# Patient Record
Sex: Female | Born: 1950 | Race: Black or African American | Hispanic: No | Smoking: Never smoker
Health system: Southern US, Community
[De-identification: ages and names within clinical notes are randomized; demographics above are authoritative.]

## PROBLEM LIST (undated history)

## (undated) DIAGNOSIS — L299 Pruritus, unspecified: Secondary | ICD-10-CM

## (undated) DIAGNOSIS — K5732 Diverticulitis of large intestine without perforation or abscess without bleeding: Secondary | ICD-10-CM

## (undated) DIAGNOSIS — H5712 Ocular pain, left eye: Secondary | ICD-10-CM

## (undated) DIAGNOSIS — H269 Unspecified cataract: Secondary | ICD-10-CM

## (undated) DIAGNOSIS — I10 Essential (primary) hypertension: Secondary | ICD-10-CM

## (undated) DIAGNOSIS — G8929 Other chronic pain: Secondary | ICD-10-CM

## (undated) DIAGNOSIS — N201 Calculus of ureter: Secondary | ICD-10-CM

## (undated) DIAGNOSIS — F32A Depression, unspecified: Secondary | ICD-10-CM

## (undated) DIAGNOSIS — H40009 Preglaucoma, unspecified, unspecified eye: Secondary | ICD-10-CM

## (undated) DIAGNOSIS — B029 Zoster without complications: Secondary | ICD-10-CM

## (undated) DIAGNOSIS — I219 Acute myocardial infarction, unspecified: Secondary | ICD-10-CM

## (undated) DIAGNOSIS — Z170 Estrogen receptor positive status [ER+]: Secondary | ICD-10-CM

## (undated) DIAGNOSIS — T8859XA Other complications of anesthesia, initial encounter: Secondary | ICD-10-CM

## (undated) DIAGNOSIS — J309 Allergic rhinitis, unspecified: Secondary | ICD-10-CM

## (undated) DIAGNOSIS — M1990 Unspecified osteoarthritis, unspecified site: Secondary | ICD-10-CM

## (undated) DIAGNOSIS — M7061 Trochanteric bursitis, right hip: Secondary | ICD-10-CM

## (undated) DIAGNOSIS — Z923 Personal history of irradiation: Secondary | ICD-10-CM

## (undated) DIAGNOSIS — R3915 Urgency of urination: Secondary | ICD-10-CM

## (undated) DIAGNOSIS — K5900 Constipation, unspecified: Secondary | ICD-10-CM

## (undated) DIAGNOSIS — R634 Abnormal weight loss: Secondary | ICD-10-CM

## (undated) DIAGNOSIS — M797 Fibromyalgia: Secondary | ICD-10-CM

## (undated) DIAGNOSIS — T7840XA Allergy, unspecified, initial encounter: Secondary | ICD-10-CM

## (undated) DIAGNOSIS — H409 Unspecified glaucoma: Secondary | ICD-10-CM

## (undated) DIAGNOSIS — K5790 Diverticulosis of intestine, part unspecified, without perforation or abscess without bleeding: Secondary | ICD-10-CM

## (undated) DIAGNOSIS — R52 Pain, unspecified: Secondary | ICD-10-CM

## (undated) DIAGNOSIS — Z87442 Personal history of urinary calculi: Secondary | ICD-10-CM

## (undated) DIAGNOSIS — G3184 Mild cognitive impairment, so stated: Secondary | ICD-10-CM

## (undated) DIAGNOSIS — Z9221 Personal history of antineoplastic chemotherapy: Secondary | ICD-10-CM

## (undated) DIAGNOSIS — N289 Disorder of kidney and ureter, unspecified: Secondary | ICD-10-CM

## (undated) DIAGNOSIS — R1032 Left lower quadrant pain: Secondary | ICD-10-CM

## (undated) DIAGNOSIS — Z87448 Personal history of other diseases of urinary system: Secondary | ICD-10-CM

## (undated) DIAGNOSIS — R251 Tremor, unspecified: Secondary | ICD-10-CM

## (undated) DIAGNOSIS — G500 Trigeminal neuralgia: Secondary | ICD-10-CM

## (undated) DIAGNOSIS — K140 Glossitis: Secondary | ICD-10-CM

## (undated) DIAGNOSIS — R413 Other amnesia: Secondary | ICD-10-CM

## (undated) DIAGNOSIS — R51 Headache: Secondary | ICD-10-CM

## (undated) DIAGNOSIS — S93602A Unspecified sprain of left foot, initial encounter: Secondary | ICD-10-CM

## (undated) DIAGNOSIS — C50411 Malignant neoplasm of upper-outer quadrant of right female breast: Secondary | ICD-10-CM

## (undated) DIAGNOSIS — Z8619 Personal history of other infectious and parasitic diseases: Secondary | ICD-10-CM

## (undated) DIAGNOSIS — M549 Dorsalgia, unspecified: Secondary | ICD-10-CM

## (undated) DIAGNOSIS — R519 Headache, unspecified: Secondary | ICD-10-CM

## (undated) DIAGNOSIS — R319 Hematuria, unspecified: Secondary | ICD-10-CM

## (undated) DIAGNOSIS — D509 Iron deficiency anemia, unspecified: Secondary | ICD-10-CM

## (undated) DIAGNOSIS — M940 Chondrocostal junction syndrome [Tietze]: Secondary | ICD-10-CM

## (undated) HISTORY — DX: Acute myocardial infarction, unspecified: I21.9

## (undated) HISTORY — DX: Zoster without complications: B02.9

## (undated) HISTORY — DX: Ocular pain, left eye: H57.12

## (undated) HISTORY — DX: Trigeminal neuralgia: G50.0

## (undated) HISTORY — DX: Estrogen receptor positive status (ER+): Z17.0

## (undated) HISTORY — DX: Diverticulitis of large intestine without perforation or abscess without bleeding: K57.32

## (undated) HISTORY — DX: Left lower quadrant pain: R10.32

## (undated) HISTORY — DX: Morbid (severe) obesity due to excess calories: E66.01

## (undated) HISTORY — DX: Diverticulosis of intestine, part unspecified, without perforation or abscess without bleeding: K57.90

## (undated) HISTORY — DX: Allergic rhinitis, unspecified: J30.9

## (undated) HISTORY — DX: Trochanteric bursitis, right hip: M70.61

## (undated) HISTORY — DX: Constipation, unspecified: K59.00

## (undated) HISTORY — DX: Abnormal weight loss: R63.4

## (undated) HISTORY — DX: Fibromyalgia: M79.7

## (undated) HISTORY — DX: Depression, unspecified: F32.A

## (undated) HISTORY — DX: Glossitis: K14.0

## (undated) HISTORY — DX: Unspecified glaucoma: H40.9

## (undated) HISTORY — DX: Unspecified osteoarthritis, unspecified site: M19.90

## (undated) HISTORY — DX: Chondrocostal junction syndrome (tietze): M94.0

## (undated) HISTORY — DX: Essential (primary) hypertension: I10

## (undated) HISTORY — DX: Personal history of other diseases of urinary system: Z87.448

## (undated) HISTORY — PX: WISDOM TOOTH EXTRACTION: SHX21

## (undated) HISTORY — DX: Dorsalgia, unspecified: M54.9

## (undated) HISTORY — DX: Unspecified cataract: H26.9

## (undated) HISTORY — DX: Mild cognitive impairment of uncertain or unknown etiology: G31.84

## (undated) HISTORY — DX: Hematuria, unspecified: R31.9

## (undated) HISTORY — PX: COLON SURGERY: SHX602

## (undated) HISTORY — DX: Pain, unspecified: R52

## (undated) HISTORY — PX: TONSILLECTOMY: SUR1361

## (undated) HISTORY — DX: Personal history of other infectious and parasitic diseases: Z86.19

## (undated) HISTORY — DX: Allergy, unspecified, initial encounter: T78.40XA

## (undated) HISTORY — DX: Other amnesia: R41.3

## (undated) HISTORY — PX: OTHER SURGICAL HISTORY: SHX169

## (undated) HISTORY — DX: Pruritus, unspecified: L29.9

## (undated) HISTORY — DX: Iron deficiency anemia, unspecified: D50.9

## (undated) HISTORY — DX: Unspecified sprain of left foot, initial encounter: S93.602A

## (undated) HISTORY — DX: Headache: R51

## (undated) HISTORY — DX: Tremor, unspecified: R25.1

## (undated) HISTORY — DX: Malignant neoplasm of upper-outer quadrant of right female breast: C50.411

## (undated) NOTE — *Deleted (*Deleted)
[facility]   Telephone:(336) [phone] Fax:(336) [phone]   Clinic Follow up Note   Patient Care Team: Myrlene Broker, MD as PCP - General (Internal Medicine) Quintella Reichert, MD as PCP - Cardiology (Cardiology) Pershing Proud, RN as Oncology Nurse Navigator Donnelly Angelica, RN as Oncology Nurse Navigator Hoxworth, Sharlet Salina, MD (Inactive) as Consulting Physician (General Surgery) Malachy Mood, MD as Consulting Physician (Hematology) Antony Blackbird, MD as Consulting Physician (Radiation Oncology) Pollyann Samples, NP as Nurse Practitioner (Nurse Practitioner)  Date of Service:  08/10/2020  CHIEF COMPLAINT: F/u of right breast cancer  SUMMARY OF ONCOLOGIC HISTORY: Oncology History Overview Note  Cancer Staging Malignant neoplasm of upper-outer quadrant of right breast in female, estrogen receptor positive (HCC) Staging form: Breast, AJCC 8th Edition - Clinical stage from 11/10/2018: Stage IA (cT1b, cN0, cM0, G2, ER+, PR+, HER2+) - Signed by Malachy Mood, MD on 11/18/2018     Malignant neoplasm of upper-outer quadrant of right breast in female, estrogen receptor positive (HCC)  11/07/2018 Mammogram   Diagnostic 11/07/18 IMPRESSION: 1. 1 x 0.7 x 0.6 cm hypoechoic mass with distortion at the 9-9:30 position of the RIGHT breast 6 cm from the nipple with distortion in the UPPER-OUTER RIGHT breast, corresponding to the mammographic abnormality. One RIGHT axillary lymph node with borderline cortical thickness. Tissue sampling of the RIGHT breast mass and borderline RIGHT axillary lymph node recommended. ADDENDUM: Patient returned today for biopsy of a single borderline thickened lymph node in the RIGHT axilla. I could not reproduce a morphologically abnormal lymph node today, possibly resolved in the interval. Today, only normal appearing lymph nodes were identified in the RIGHT axilla. As such, the axillary ultrasound-guided biopsy was canceled.   11/10/2018 Cancer  Staging   Staging form: Breast, AJCC 8th Edition - Clinical stage from 11/10/2018: Stage IA (cT1b, cN0, cM0, G2, ER+, PR+, HER2+) - Signed by Malachy Mood, MD on 11/18/2018   11/11/2018 Initial Biopsy   Diagnosis 11/11/18 Breast, right, needle core biopsy, upper outer - 9:30 o'clock position - INVASIVE DUCTAL CARCINOMA, GRADE II/III. - SEE MICROSCOPIC DESCRIPTION.   11/11/2018 Receptors her2   Results: IMMUNOHISTOCHEMICAL AND MORPHOMETRIC ANALYSIS PERFORMED MANUALLY The tumor cells are POSITIVE for Her2 (3+). Estrogen Receptor: 95%, POSITIVE, STRONG STAINING INTENSITY Progesterone Receptor: 5%, POSITIVE, STRONG STAINING INTENSITY Proliferation Marker Ki67: 20%   11/17/2018 Initial Diagnosis   Malignant neoplasm of upper-outer quadrant of right breast in female, estrogen receptor positive (HCC)   11/28/2018 Surgery   RIGHT BREAST LUMPECTOMY WITH RADIOACTIVE SEED AND RIGHT AXILLARY SENTINEL LYMPH NODE BIOPSY by Dr. Johna Sheriff  11/28/18    11/28/2018 Pathology Results   Diagnosis 11/28/18  1. Breast, lumpectomy, Right w/seed - INVASIVE DUCTAL CARCINOMA, 1.6 CM, NOTTINGHAM GRADE 2 OF 3. - MARGINS OF RESECTION ARE NOT INVOLVED (CLOSEST MARGIN: LESS THAN 1 MM, ANTERIOR). - DUCTAL CARCINOMA IN SITU. - BIOPSY SITE CHANGES. - SEE ONCOLOGY TABLE. 2. Lymph node, sentinel, biopsy, right Axillary - ONE LYMPH NODE, NEGATIVE FOR CARCINOMA (0/1). 3. Lymph node, sentinel, biopsy, right Axillary - ONE LYMPH NODE, NEGATIVE FOR CARCINOMA (0/1).   11/28/2018 Cancer Staging   Staging form: Breast, AJCC 8th Edition - Pathologic stage from 11/28/2018: Stage IA (pT1c, pN0, cM0, G2, ER+, PR+, HER2+) - Signed by Malachy Mood, MD on 12/11/2018   12/22/2018 Procedure   Baseline ECHO 12/22/18  IMPRESSIONS  1. The left ventricle has normal systolic function with an ejection fraction of 60-65%. The cavity size was normal. There is mild concentric left  ventricular hypertrophy. Left ventricular diastolic parameters were normal.  2.  The right ventricle has normal systolic function. The cavity was normal. There is no increase in right ventricular wall thickness.  3. Left atrial size was moderately dilated.  4. The aortic valve is tricuspid. Aortic valve regurgitation was not assessed by color flow Doppler.    01/02/2019 - 02/12/2020 Chemotherapy   Adjuvant chemo weekly Taxol with Herceptin starting 01/02/19. Will d/c after 02/13/19 and will switch to Kadcyla q3weeks starting 02/20/19 due to worsening neuropathy. Due to worsening Muscle cramps Kadcyla stopped 04/03/19 and switched to Herceptin q3weeks on 04/23/19. Will complete on 02/12/20   04/09/2019 - 05/28/2019 Radiation Therapy   RT with Dr. Roselind Messier 04/09/19-05/28/19   06/29/2019 -  Anti-estrogen oral therapy   Exemestane 25mg  daily starting 06/29/19   04/28/2020 Survivorship   SCP delivered by Santiago Glad, NP      CURRENT THERAPY:   -IV iron as needed -Exemestane 25mg  daily starting 06/29/19  INTERVAL HISTORY: *** Summer [name] is here for a follow up of right breast cancer. She was last seen by me 7 months ago and seen by NP Lacie 4 months ago in interim. She presents to the clinic alone.    REVIEW OF SYSTEMS:  *** Constitutional: Denies fevers, chills or abnormal weight loss Eyes: Denies blurriness of vision Ears, nose, mouth, throat, and face: Denies mucositis or sore throat Respiratory: Denies cough, dyspnea or wheezes Cardiovascular: Denies palpitation, chest discomfort or lower extremity swelling Gastrointestinal:  Denies nausea, heartburn or change in bowel habits Skin: Denies abnormal skin rashes Lymphatics: Denies new lymphadenopathy or easy bruising Neurological:Denies numbness, tingling or new weaknesses Behavioral/Psych: Mood is stable, no new changes  All other systems were reviewed with the patient and are negative.  MEDICAL HISTORY:  Past Medical History:  Diagnosis Date  . Abdominal pain, left lower quadrant 11/09/2009   Qualifier:  Diagnosis of  By: Gabriel Rung LPN, Harriett Sine    . Allergic rhinitis 08/26/2008   Qualifier: Diagnosis of  By: Tawanna Cooler MD, Eugenio Hoes   . Anemia, iron deficiency 11/21/2018  . Arthritis shoulders and back  . BACK PAIN, CHRONIC 06/13/2007   Qualifier: Diagnosis of  By: Tawanna Cooler MD, Eugenio Hoes   . 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 Complication of anesthesia    Pt states having some "difficulty waking up"  . Constipation 07/01/2018  . Costochondritis 05/04/2014  . Diverticulitis of colon 06/13/2007   S/p segmental colectomy for perforated diverticulitis   . Diverticulosis   . Facial pain 10/06/2015  . Fibromyalgia   . Foot sprain, left, initial encounter 08/31/2016  . Glossitis 02/02/2016  . Headache 04/24/2016  . HEMATURIA UNSPECIFIED 06/25/2008   Qualifier: Diagnosis of  By: Tawanna Cooler MD, Eugenio Hoes   . HEMATURIA, HX OF 11/14/2009   Qualifier: Diagnosis of  By: Thurmon Fair CMA (AAMA), Fleet Contras    . History of kidney stones   . History of kidney stones   . History of shingles 2012--  no residual pain  . Itching 10/13/2018  . Left eye pain 07/01/2018  . Left ureteral calculus   . LLQ pain 04/24/2016  . LOC OSTEOARTHROS NOT SPEC PRIM/SEC OTH [facility] SITE 06/27/2009   Qualifier: Diagnosis of  By: Tawanna Cooler MD, Eugenio Hoes   . Malignant neoplasm of upper-outer quadrant of right breast in female, 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  . MCI (mild cognitive impairment)   . Memory loss 12/21/2016  .  Morbid obesity (HCC) 11/09/2010  . Pain of right side of body 08/31/2016  . Personal history of chemotherapy   . Personal history of radiation therapy   . Pruritus 10/26/2016  . Renal disorder   . Right sided temporal headache 12/21/2016  . Tremor 12/21/2016  . Trigeminal neuralgia RIGHT  . Trigeminal neuralgia of right side of face 11/30/2010  . Trochanteric bursitis, right hip 10/26/2016  . Urgency of urination   . Weight loss 07/22/2017  . Zoster without complications 09/27/2011     SURGICAL HISTORY: Past Surgical History:  Procedure Laterality Date  . BREAST BIOPSY Right 11/11/2018  . BREAST LUMPECTOMY Right 11/28/2018  . BREAST LUMPECTOMY WITH RADIOACTIVE SEED AND SENTINEL LYMPH NODE BIOPSY Right 11/28/2018   Procedure: RIGHT BREAST LUMPECTOMY WITH RADIOACTIVE SEED AND RIGHT AXILLARY SENTINEL LYMPH NODE BIOPSY;  Surgeon: Glenna Fellows, MD;  Location: [facility] OR;  Service: General;  Laterality: Right;  . CEREBRAL MICROVASCULAR DECOMPRESSION  07-05-2006   RIGHT TRIGEMINAL NERVE  . CYSTOSCOPY/RETROGRADE/URETEROSCOPY/STONE EXTRACTION WITH BASKET  07/04/2012   Procedure: CYSTOSCOPY/RETROGRADE/URETEROSCOPY/STONE EXTRACTION WITH BASKET;  Surgeon: Garnett Farm, MD;  Location: [facility];  Service: Urology;  Laterality: Left;  . EXPLORATORY LAPAROTOMY/ RESECTION MID TO DISTAL SIGMOID AND PROXIMAL RECTUM/ END PROXIMAL SIGMOID COLOSTOMY  07-17-2006   PERFORATED DIVERTICULITIS WITH PERITONITIS  . gamma knife  05/17/2016   for trigeminal neuralgia, [facility], Dr Angelyn Punt  . gamma knife  2019  . IR CV LINE INJECTION  10/27/2019  . PORTACATH PLACEMENT Left 11/28/2018   Procedure: INSERTION PORT-A-CATH WITH ULTRASOUND;  Surgeon: Glenna Fellows, MD;  Location: [facility] OR;  Service: General;  Laterality: Left;  . portacath removal    . RESECTION COLOSTOMY/ CLOSURE COLOSTOMY WITH COLOPROCTOSTOMY  02-20-2007  . TONSILLECTOMY  AS CHILD  . TOTAL KNEE ARTHROPLASTY Left 06/20/2020   Procedure: LEFT TOTAL KNEE ARTHROPLASTY;  Surgeon: Tarry Kos, MD;  Location: [facility] OR;  Service: Orthopedics;  Laterality: Left;  . URETEROSCOPY  07/04/2012   Procedure: URETEROSCOPY;  Surgeon: Garnett Farm, MD;  Location: [facility];  Service: Urology;  Laterality: Left;  Marland Kitchen VAGINAL HYSTERECTOMY  1998   Partial  . WISDOM TOOTH EXTRACTION      I have reviewed the social history and family history with the patient and they are unchanged from previous note.  ALLERGIES:   is allergic to clindamycin hcl.  MEDICATIONS:  Current Outpatient Medications  Medication Sig Dispense Refill  . augmented betamethasone dipropionate (DIPROLENE-AF) 0.05 % cream Apply 1 application topically daily as needed for itching.    . diphenhydrAMINE (BENADRYL) 25 MG tablet Take 25 mg by mouth every 6 (six) hours as needed for itching, allergies or sleep.    Marland Kitchen docusate sodium (COLACE) 100 MG capsule Take 1 capsule (100 mg total) by mouth daily as needed. 30 capsule 2  . exemestane (AROMASIN) 25 MG tablet Take 25 mg by mouth daily.    Marland Kitchen gabapentin (NEURONTIN) 600 MG tablet TAKE 2 TABLETS BY MOUTH 3 TIMES A DAY. Please call [phone] to schedule an appt. 540 tablet 4  . glycerin adult 2 g suppository Place 1 suppository rectally as needed for constipation. 12 suppository 0  . hydrOXYzine (ATARAX/VISTARIL) 25 MG tablet Take 0.5 tablets (12.5 mg total) by mouth every 8 (eight) hours as needed for itching. 30 tablet 0  . methocarbamol (ROBAXIN) 500 MG tablet Take 1 tablet (500 mg total) by mouth 2 (two) times daily as needed for muscle spasms. 30 tablet 0  .  ondansetron (ZOFRAN) 4 MG tablet Take 1 tablet (4 mg total) by mouth every 8 (eight) hours as needed for nausea or vomiting. 40 tablet 0  . oxyCODONE-acetaminophen (PERCOCET) 5-325 MG tablet Take 1-2 tablets by mouth every 8 (eight) hours as needed for severe pain. 40 tablet 0  . rivaroxaban (XARELTO) 10 MG TABS tablet Take 1 tablet (10 mg total) by mouth daily. 35 tablet 0  . traMADol (ULTRAM) 50 MG tablet Take 1-2 tabs po tid prn pain 30 tablet 0  . Vitamin D, Ergocalciferol, (DRISDOL) 1.25 MG (50000 UNIT) CAPS capsule Take 1 capsule (50,000 Units total) by mouth every 7 (seven) days. (Patient taking differently: Take 50,000 Units by mouth every Saturday. ) 12 capsule 0   No current facility-administered medications for this visit.    PHYSICAL EXAMINATION: ECOG PERFORMANCE STATUS: {CHL ONC ECOG PS:[phone]}  There were no vitals  filed for this visit. There were no vitals filed for this visit. *** GENERAL:alert, no distress and comfortable SKIN: skin color, texture, turgor are normal, no rashes or significant lesions EYES: normal, Conjunctiva are pink and non-injected, sclera clear {OROPHARYNX:no exudate, no erythema and lips, buccal mucosa, and tongue normal}  NECK: supple, thyroid normal size, non-tender, without nodularity LYMPH:  no palpable lymphadenopathy in the cervical, axillary {or inguinal} LUNGS: clear to auscultation and percussion with normal breathing effort HEART: regular rate & rhythm and no murmurs and no lower extremity edema ABDOMEN:abdomen soft, non-tender and normal bowel sounds Musculoskeletal:no cyanosis of digits and no clubbing  NEURO: alert & oriented x 3 with fluent speech, no focal motor/sensory deficits  LABORATORY DATA:  I have reviewed the data as listed CBC Latest Ref Rng & Units 06/21/2020 06/13/2020 04/28/2020  WBC 4.0 - 10.5 K/uL 7.2 4.4 4.3  Hemoglobin 12.0 - 15.0 g/dL 10.3(L) 12.6 12.3  Hematocrit 36 - 46 % 32.4(L) 41.2 38.7  Platelets 150 - 400 K/uL 204 249 241     CMP Latest Ref Rng & Units 06/21/2020 06/13/2020 04/28/2020  Glucose 70 - 99 mg/dL 161(W) 94 88  BUN 8 - 23 mg/dL 12 9 10   Creatinine 0.44 - 1.00 mg/dL 9.60 4.54 0.98  Sodium 135 - 145 mmol/L 137 142 141  Potassium 3.5 - 5.1 mmol/L 4.1 3.8 3.8  Chloride 98 - 111 mmol/L 105 106 105  CO2 22 - 32 mmol/L 25 28 30   Calcium 8.9 - 10.3 mg/dL 8.3(L) 9.4 9.6  Total Protein 6.5 - 8.1 g/dL - 6.9 6.6  Total Bilirubin 0.3 - 1.2 mg/dL - 0.7 1.1(B)  Alkaline Phos 38 - 126 U/L - 100 118  AST 15 - 41 U/L - 24 17  ALT 0 - 44 U/L - 12 7      RADIOGRAPHIC STUDIES: I have personally reviewed the radiological images as listed and agreed with the findings in the report. No results found.   ASSESSMENT & PLAN:  Tacora Athanas is a 83 y.o. female with   1.Malignant neoplasm of upper-outer quadrant of right breast,  StageIA,pT1b,N0,M0, ER/PR+, HER2+, GradeII -She was diagnosed in 11/2018. She is s/p right breastlumpectomyand SLNB and adjuvant radiation.  -She startedadjuvantweekly Taxol with Herceptinon4/24/20butstopped after 6 weeks therapydue to worsening neuropathyand infusion reaction. She also tried Kadcyla but due to worsening muscle cramps she was changed to Herceptin infusionq3weeks starting 04/23/19. If she tolerates we will continueanti-her2 treatmentuntil4/2021. -I started her on antiestrogen therapy with Exemestane in 06/2019. She has tolerated moderately due to known joint pain worsening in shoulders and knees. She is willing  to continue for now. -She remains stable. She notes her recent progression in Neuropathy is related to spinal issues. She will continue to f/u with Specialist about this.  -Labs reviewed, and adequate to proceed with Herceptin today. I calculated all her anti-HER2 therapies together and her final Herceptin will be next cycle in 3 weeks.  -She is fine to have her PAC removed after she completes Herceptin. If not done soon with continue Port flushes every 6-8 weeks. She is agreeable with removal.  -I discussed given her small tumor I do not recommend adjuvant anti-HER2 Nerlynx due to lower benefit.  -Continue Exemestane -f/u in 3 months with survivorship clinic.    2. Significant Muscle Cramps, Diffuse body pain, joint pain/arthritis  possible related to chemo, improved lately  -She has been seen by her neurologist Dr Terrace Arabia who increased herGabapentinto 600mg  7 tabs a day. She was to have nerve study in 07/2019 -She does have arthritis progression due to Exemestane with b/l knee and shoulder pain. Ipreviouslyrecommend she start OTC Glucosamine and increase exercise to help her pain.   3. Anemia,iron deficiency -Last colonoscopy was in 04/2012 which wasunremarkable except diverticulosis.  -She had partial colectomy in the past due to severe  diverticulosis. -This is possiblyiron deficiency from diverticulosis.She denies any current bleeding.  -Her workupin early 2020showed iron 17, ferritin 4, normal retic ct, folate Vitamin B12 and SPEP panels. -She received iv iron and anemia has resolved -Dr. Leone Payor is open to have Colonoscopy for further evaluation. I recommend she proceed. -IV iron was given 3/18/20and 12/12/18. Anemia Has been resolved lately, will continue to check her iron level  4. Trigeminal neuralgia  -5/26/20brain and neck MRIwere stable. -She has been having moderate right side HA which I suspect is exacerbation of her neuralgia secondary to chemo. -Has not been completely controlled but managing with occasionally worsened flares.  -She has oxycodone which she can use for significant pain. Otherwise she is on Gabapentin 600mg  7 tabs a day.  5.PeripheralNeuropathy, G1-2, Left leg pain  -Secondary to chemoTaxolstarted aftercycle 3. -Taxol switched toTDM-1 Yisroel Ramming 6/12/20which was switched to Herceptin on 04/23/19 -On Robaxin, B12. High dose Gabapentin dose not help.  -Her neuropathycontinues to slowly progress with Tingling in her left foot and numbness of toes (L>R).  -She attributes her progression to left hip pain that radiates to her toes. 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  -She has gone to PT and with TENS Unit device she can get some relief.  -She will continue to f/u with specialists about this.   6. Osteopenia  -Her baseline DEXA from 07/2019 shows osteopenia with lowest T-score -1.1 at left hip.  -Will continue to monitor on AI as this can weaken her bones. Repeat every 2 years.  -I recommend she start OTC calcium and Vitamin D.  7.COVID19 (+)10/29/19. Recovered well.     PLAN: -Labs reviewed and adequate to proceed with Herceptin today -Continue exemestane -Copy note to Surgeon for [facility] removal  -Lab,  flush and last Herceptin in 3 weeks  -Lab and Survivorship clinic in 3 months with NP Lacie    No problem-specific Assessment & Plan notes found for this encounter.   No orders of the defined types were placed in this encounter.  All questions were answered. The patient knows to call the clinic with any problems, questions or concerns. No barriers to learning was detected. The total time spent in the  appointment was {CHL ONC TIME VISIT - [id number]}.     Delphina Cahill 08/10/2020   Rogelia Rohrer, am acting as scribe for Malachy Mood, MD.   {Add scribe attestation statement}

---

## 1996-09-10 HISTORY — PX: VAGINAL HYSTERECTOMY: SUR661

## 1999-12-14 ENCOUNTER — Other Ambulatory Visit: Admission: RE | Admit: 1999-12-14 | Discharge: 1999-12-14 | Payer: Self-pay | Admitting: Obstetrics and Gynecology

## 2001-04-07 ENCOUNTER — Other Ambulatory Visit: Admission: RE | Admit: 2001-04-07 | Discharge: 2001-04-07 | Payer: Self-pay | Admitting: Obstetrics and Gynecology

## 2002-04-28 ENCOUNTER — Other Ambulatory Visit: Admission: RE | Admit: 2002-04-28 | Discharge: 2002-04-28 | Payer: Self-pay | Admitting: Obstetrics and Gynecology

## 2003-01-20 ENCOUNTER — Encounter: Payer: Self-pay | Admitting: Family Medicine

## 2003-01-20 ENCOUNTER — Inpatient Hospital Stay (HOSPITAL_COMMUNITY): Admission: AD | Admit: 2003-01-20 | Discharge: 2003-01-22 | Payer: Self-pay | Admitting: Family Medicine

## 2003-01-25 ENCOUNTER — Encounter: Admission: RE | Admit: 2003-01-25 | Discharge: 2003-03-09 | Payer: Self-pay | Admitting: Internal Medicine

## 2003-03-04 ENCOUNTER — Encounter: Payer: Self-pay | Admitting: Radiology

## 2003-03-04 ENCOUNTER — Encounter: Admission: RE | Admit: 2003-03-04 | Discharge: 2003-03-04 | Payer: Self-pay | Admitting: Family Medicine

## 2003-03-04 ENCOUNTER — Encounter: Payer: Self-pay | Admitting: Family Medicine

## 2003-03-18 ENCOUNTER — Encounter: Payer: Self-pay | Admitting: Family Medicine

## 2003-03-18 ENCOUNTER — Encounter: Admission: RE | Admit: 2003-03-18 | Discharge: 2003-03-18 | Payer: Self-pay | Admitting: Family Medicine

## 2003-04-01 ENCOUNTER — Encounter: Admission: RE | Admit: 2003-04-01 | Discharge: 2003-04-01 | Payer: Self-pay | Admitting: Family Medicine

## 2003-04-01 ENCOUNTER — Encounter: Payer: Self-pay | Admitting: Family Medicine

## 2003-05-10 ENCOUNTER — Other Ambulatory Visit: Admission: RE | Admit: 2003-05-10 | Discharge: 2003-05-10 | Payer: Self-pay | Admitting: Obstetrics and Gynecology

## 2003-08-17 ENCOUNTER — Encounter: Payer: Self-pay | Admitting: Internal Medicine

## 2003-09-02 ENCOUNTER — Ambulatory Visit (HOSPITAL_COMMUNITY): Admission: RE | Admit: 2003-09-02 | Discharge: 2003-09-02 | Payer: Self-pay | Admitting: Obstetrics and Gynecology

## 2004-08-21 ENCOUNTER — Ambulatory Visit: Payer: Self-pay | Admitting: Family Medicine

## 2004-08-28 ENCOUNTER — Ambulatory Visit: Payer: Self-pay | Admitting: Family Medicine

## 2005-01-01 ENCOUNTER — Ambulatory Visit: Payer: Self-pay | Admitting: Family Medicine

## 2005-01-08 ENCOUNTER — Ambulatory Visit: Payer: Self-pay | Admitting: Family Medicine

## 2005-11-22 ENCOUNTER — Ambulatory Visit: Payer: Self-pay | Admitting: Family Medicine

## 2005-12-18 ENCOUNTER — Encounter: Admission: RE | Admit: 2005-12-18 | Discharge: 2005-12-18 | Payer: Self-pay | Admitting: Neurology

## 2005-12-31 ENCOUNTER — Ambulatory Visit: Payer: Self-pay | Admitting: Internal Medicine

## 2006-04-09 ENCOUNTER — Ambulatory Visit: Payer: Self-pay | Admitting: Family Medicine

## 2006-06-18 ENCOUNTER — Emergency Department (HOSPITAL_COMMUNITY): Admission: EM | Admit: 2006-06-18 | Discharge: 2006-06-19 | Payer: Self-pay | Admitting: Emergency Medicine

## 2006-07-05 HISTORY — PX: CEREBRAL MICROVASCULAR DECOMPRESSION: SHX1328

## 2006-07-16 ENCOUNTER — Inpatient Hospital Stay (HOSPITAL_COMMUNITY): Admission: EM | Admit: 2006-07-16 | Discharge: 2006-07-25 | Payer: Self-pay | Admitting: Emergency Medicine

## 2006-07-17 ENCOUNTER — Encounter (INDEPENDENT_AMBULATORY_CARE_PROVIDER_SITE_OTHER): Payer: Self-pay | Admitting: Specialist

## 2006-07-17 HISTORY — PX: OTHER SURGICAL HISTORY: SHX169

## 2006-07-30 ENCOUNTER — Ambulatory Visit: Payer: Self-pay | Admitting: Family Medicine

## 2006-08-20 ENCOUNTER — Ambulatory Visit: Payer: Self-pay | Admitting: Family Medicine

## 2006-08-20 LAB — CONVERTED CEMR LAB
Basophils Absolute: 0 10*3/uL (ref 0.0–0.1)
Basophils Relative: 0.6 % (ref 0.0–1.0)
Creatinine, Ser: 0.7 mg/dL (ref 0.4–1.2)
Eosinophil percent: 5.8 % — ABNORMAL HIGH (ref 0.0–5.0)
Glucose, Bld: 96 mg/dL (ref 70–99)
HCT: 36.4 % (ref 36.0–46.0)
Hemoglobin: 11.9 g/dL — ABNORMAL LOW (ref 12.0–15.0)
Lymphocytes Relative: 31.6 % (ref 12.0–46.0)
MCHC: 32.6 g/dL (ref 30.0–36.0)
MCV: 90.1 fL (ref 78.0–100.0)
Monocytes Absolute: 0.9 10*3/uL — ABNORMAL HIGH (ref 0.2–0.7)
Monocytes Relative: 10.9 % (ref 3.0–11.0)
Neutro Abs: 4 10*3/uL (ref 1.4–7.7)
Neutrophils Relative %: 51.1 % (ref 43.0–77.0)
Platelets: 395 10*3/uL (ref 150–400)
RBC: 4.04 M/uL (ref 3.87–5.11)
RDW: 14.3 % (ref 11.5–14.6)
TSH: 0.65 microintl units/mL (ref 0.35–5.50)
WBC: 7.9 10*3/uL (ref 4.5–10.5)

## 2006-09-13 ENCOUNTER — Ambulatory Visit: Payer: Self-pay | Admitting: Family Medicine

## 2006-10-10 ENCOUNTER — Ambulatory Visit: Payer: Self-pay | Admitting: Family Medicine

## 2006-10-14 ENCOUNTER — Encounter: Admission: RE | Admit: 2006-10-14 | Discharge: 2006-10-14 | Payer: Self-pay | Admitting: General Surgery

## 2007-02-20 ENCOUNTER — Inpatient Hospital Stay (HOSPITAL_COMMUNITY): Admission: RE | Admit: 2007-02-20 | Discharge: 2007-02-24 | Payer: Self-pay | Admitting: General Surgery

## 2007-02-20 ENCOUNTER — Encounter (INDEPENDENT_AMBULATORY_CARE_PROVIDER_SITE_OTHER): Payer: Self-pay | Admitting: General Surgery

## 2007-02-20 HISTORY — PX: OTHER SURGICAL HISTORY: SHX169

## 2007-03-25 ENCOUNTER — Other Ambulatory Visit: Admission: RE | Admit: 2007-03-25 | Discharge: 2007-03-25 | Payer: Self-pay | Admitting: Obstetrics and Gynecology

## 2007-04-22 ENCOUNTER — Ambulatory Visit: Payer: Self-pay | Admitting: Family Medicine

## 2007-06-06 ENCOUNTER — Ambulatory Visit: Payer: Self-pay | Admitting: Family Medicine

## 2007-06-06 LAB — CONVERTED CEMR LAB
ALT: 19 units/L (ref 0–35)
AST: 22 units/L (ref 0–37)
Albumin: 3.4 g/dL — ABNORMAL LOW (ref 3.5–5.2)
Alkaline Phosphatase: 98 units/L (ref 39–117)
BUN: 10 mg/dL (ref 6–23)
Basophils Absolute: 0 10*3/uL (ref 0.0–0.1)
Basophils Relative: 0.8 % (ref 0.0–1.0)
Bilirubin Urine: NEGATIVE
Bilirubin, Direct: 0.2 mg/dL (ref 0.0–0.3)
CO2: 33 meq/L — ABNORMAL HIGH (ref 19–32)
Calcium: 9.4 mg/dL (ref 8.4–10.5)
Chloride: 105 meq/L (ref 96–112)
Cholesterol: 216 mg/dL (ref 0–200)
Creatinine, Ser: 0.7 mg/dL (ref 0.4–1.2)
Direct LDL: 134.1 mg/dL
Eosinophils Absolute: 0.2 10*3/uL (ref 0.0–0.6)
Eosinophils Relative: 3.5 % (ref 0.0–5.0)
GFR calc Af Amer: 112 mL/min
GFR calc non Af Amer: 93 mL/min
Glucose, Bld: 90 mg/dL (ref 70–99)
Glucose, Urine, Semiquant: NEGATIVE
HCT: 36.4 % (ref 36.0–46.0)
HDL: 60.9 mg/dL (ref >39.0)
Hemoglobin: 12.4 g/dL (ref 12.0–15.0)
Ketones, urine, test strip: NEGATIVE
Lymphocytes Relative: 41.2 % (ref 12.0–46.0)
MCHC: 33.9 g/dL (ref 30.0–36.0)
MCV: 85.4 fL (ref 78.0–100.0)
Monocytes Absolute: 0.5 10*3/uL (ref 0.2–0.7)
Monocytes Relative: 9 % (ref 3.0–11.0)
Neutro Abs: 2.7 10*3/uL (ref 1.4–7.7)
Neutrophils Relative %: 45.5 % (ref 43.0–77.0)
Nitrite: NEGATIVE
Platelets: 348 10*3/uL (ref 150–400)
Potassium: 4.3 meq/L (ref 3.5–5.1)
Protein, U semiquant: NEGATIVE
RBC: 4.26 M/uL (ref 3.87–5.11)
RDW: 13 % (ref 11.5–14.6)
Sodium: 142 meq/L (ref 135–145)
Specific Gravity, Urine: 1.02
TSH: 1.11 microintl units/mL (ref 0.35–5.50)
Total Bilirubin: 1.1 mg/dL (ref 0.3–1.2)
Total CHOL/HDL Ratio: 3.5
Total Protein: 6.7 g/dL (ref 6.0–8.3)
Triglycerides: 65 mg/dL (ref 0–149)
Urobilinogen, UA: 0.2
VLDL: 13 mg/dL (ref 0–40)
WBC Urine, dipstick: NEGATIVE
WBC: 5.8 10*3/uL (ref 4.5–10.5)
pH: 7

## 2007-06-13 ENCOUNTER — Ambulatory Visit: Payer: Self-pay | Admitting: Family Medicine

## 2007-06-13 DIAGNOSIS — M549 Dorsalgia, unspecified: Secondary | ICD-10-CM

## 2007-06-13 DIAGNOSIS — K5732 Diverticulitis of large intestine without perforation or abscess without bleeding: Secondary | ICD-10-CM

## 2007-06-13 HISTORY — DX: Dorsalgia, unspecified: M54.9

## 2007-06-13 HISTORY — DX: Diverticulitis of large intestine without perforation or abscess without bleeding: K57.32

## 2007-06-18 ENCOUNTER — Encounter: Admission: RE | Admit: 2007-06-18 | Discharge: 2007-06-18 | Payer: Self-pay | Admitting: Family Medicine

## 2007-09-10 ENCOUNTER — Encounter: Admission: RE | Admit: 2007-09-10 | Discharge: 2007-09-10 | Payer: Self-pay | Admitting: Rheumatology

## 2008-06-18 ENCOUNTER — Ambulatory Visit: Payer: Self-pay | Admitting: Family Medicine

## 2008-06-18 DIAGNOSIS — N9089 Other specified noninflammatory disorders of vulva and perineum: Secondary | ICD-10-CM | POA: Insufficient documentation

## 2008-06-18 LAB — CONVERTED CEMR LAB
ALT: 23 units/L (ref 0–35)
AST: 24 units/L (ref 0–37)
Albumin: 3.7 g/dL (ref 3.5–5.2)
Alkaline Phosphatase: 88 units/L (ref 39–117)
BUN: 12 mg/dL (ref 6–23)
Basophils Absolute: 0 10*3/uL (ref 0.0–0.1)
Basophils Relative: 0.5 % (ref 0.0–3.0)
Bilirubin, Direct: 0.1 mg/dL (ref 0.0–0.3)
CO2: 30 meq/L (ref 19–32)
Calcium: 9.2 mg/dL (ref 8.4–10.5)
Chloride: 104 meq/L (ref 96–112)
Cholesterol: 192 mg/dL (ref 0–200)
Creatinine, Ser: 0.8 mg/dL (ref 0.4–1.2)
Eosinophils Absolute: 0.2 10*3/uL (ref 0.0–0.7)
Eosinophils Relative: 3.1 % (ref 0.0–5.0)
GFR calc Af Amer: 96 mL/min
GFR calc non Af Amer: 79 mL/min
Glucose, Bld: 93 mg/dL (ref 70–99)
Glucose, Urine, Semiquant: NEGATIVE
HCT: 40.5 % (ref 36.0–46.0)
HDL: 46 mg/dL (ref >39.0)
Hemoglobin: 13.5 g/dL (ref 12.0–15.0)
LDL Cholesterol: 134 mg/dL — ABNORMAL HIGH (ref 0–99)
Lymphocytes Relative: 34.2 % (ref 12.0–46.0)
MCHC: 33.2 g/dL (ref 30.0–36.0)
MCV: 88.1 fL (ref 78.0–100.0)
Monocytes Absolute: 0.5 10*3/uL (ref 0.1–1.0)
Monocytes Relative: 8.4 % (ref 3.0–12.0)
Neutro Abs: 3.1 10*3/uL (ref 1.4–7.7)
Neutrophils Relative %: 53.8 % (ref 43.0–77.0)
Nitrite: NEGATIVE
Platelets: 352 10*3/uL (ref 150–400)
Potassium: 3.7 meq/L (ref 3.5–5.1)
RBC: 4.6 M/uL (ref 3.87–5.11)
RDW: 12.4 % (ref 11.5–14.6)
Sodium: 141 meq/L (ref 135–145)
Specific Gravity, Urine: >=1.03
TSH: 0.57 microintl units/mL (ref 0.35–5.50)
Total Bilirubin: 0.7 mg/dL (ref 0.3–1.2)
Total CHOL/HDL Ratio: 4.2
Total Protein: 7.3 g/dL (ref 6.0–8.3)
Triglycerides: 60 mg/dL (ref 0–149)
Urobilinogen, UA: 1
VLDL: 12 mg/dL (ref 0–40)
WBC Urine, dipstick: NEGATIVE
WBC: 5.7 10*3/uL (ref 4.5–10.5)
pH: 6

## 2008-06-21 ENCOUNTER — Encounter: Admission: RE | Admit: 2008-06-21 | Discharge: 2008-06-21 | Payer: Self-pay | Admitting: Family Medicine

## 2008-06-25 ENCOUNTER — Ambulatory Visit: Payer: Self-pay | Admitting: Family Medicine

## 2008-06-25 DIAGNOSIS — R319 Hematuria, unspecified: Secondary | ICD-10-CM

## 2008-06-25 HISTORY — DX: Hematuria, unspecified: R31.9

## 2008-06-25 LAB — CONVERTED CEMR LAB
Bilirubin Urine: NEGATIVE
Glucose, Urine, Semiquant: NEGATIVE
Ketones, urine, test strip: NEGATIVE
Nitrite: NEGATIVE
Specific Gravity, Urine: 1.02
Urobilinogen, UA: 0.2
WBC Urine, dipstick: NEGATIVE
pH: 6

## 2008-08-26 ENCOUNTER — Ambulatory Visit: Payer: Self-pay | Admitting: Family Medicine

## 2008-08-26 DIAGNOSIS — J309 Allergic rhinitis, unspecified: Secondary | ICD-10-CM

## 2008-08-26 HISTORY — DX: Allergic rhinitis, unspecified: J30.9

## 2008-08-26 LAB — CONVERTED CEMR LAB
Bilirubin Urine: NEGATIVE
Glucose, Urine, Semiquant: NEGATIVE
Ketones, urine, test strip: NEGATIVE
Nitrite: NEGATIVE
Specific Gravity, Urine: 1.02
Urobilinogen, UA: 0.2
WBC Urine, dipstick: NEGATIVE
pH: 6

## 2009-03-21 ENCOUNTER — Ambulatory Visit: Payer: Self-pay | Admitting: Family Medicine

## 2009-05-28 ENCOUNTER — Ambulatory Visit: Payer: Self-pay | Admitting: Family Medicine

## 2009-06-03 ENCOUNTER — Telehealth: Payer: Self-pay | Admitting: Family Medicine

## 2009-06-20 ENCOUNTER — Ambulatory Visit: Payer: Self-pay | Admitting: Family Medicine

## 2009-06-20 LAB — CONVERTED CEMR LAB
ALT: 17 units/L (ref 0–35)
AST: 21 units/L (ref 0–37)
Albumin: 3.8 g/dL (ref 3.5–5.2)
Alkaline Phosphatase: 80 units/L (ref 39–117)
BUN: 12 mg/dL (ref 6–23)
Basophils Absolute: 0.1 10*3/uL (ref 0.0–0.1)
Basophils Relative: 1.6 % (ref 0.0–3.0)
Bilirubin, Direct: 0.1 mg/dL (ref 0.0–0.3)
CO2: 27 meq/L (ref 19–32)
Calcium: 9.2 mg/dL (ref 8.4–10.5)
Chloride: 104 meq/L (ref 96–112)
Cholesterol: 215 mg/dL — ABNORMAL HIGH (ref 0–200)
Creatinine, Ser: 0.8 mg/dL (ref 0.4–1.2)
Direct LDL: 144.5 mg/dL
Eosinophils Absolute: 0.2 10*3/uL (ref 0.0–0.7)
Eosinophils Relative: 3.3 % (ref 0.0–5.0)
GFR calc non Af Amer: 95.36 mL/min (ref >60)
Glucose, Bld: 101 mg/dL — ABNORMAL HIGH (ref 70–99)
HCT: 39.8 % (ref 36.0–46.0)
HDL: 54.6 mg/dL (ref >39.00)
Hemoglobin: 13.3 g/dL (ref 12.0–15.0)
Lymphocytes Relative: 34.7 % (ref 12.0–46.0)
Lymphs Abs: 2.4 10*3/uL (ref 0.7–4.0)
MCHC: 33.4 g/dL (ref 30.0–36.0)
MCV: 88.9 fL (ref 78.0–100.0)
Monocytes Absolute: 0.7 10*3/uL (ref 0.1–1.0)
Monocytes Relative: 9.6 % (ref 3.0–12.0)
Neutro Abs: 3.5 10*3/uL (ref 1.4–7.7)
Neutrophils Relative %: 50.8 % (ref 43.0–77.0)
Platelets: 290 10*3/uL (ref 150.0–400.0)
Potassium: 4.2 meq/L (ref 3.5–5.1)
RBC: 4.47 M/uL (ref 3.87–5.11)
RDW: 12.9 % (ref 11.5–14.6)
Sodium: 144 meq/L (ref 135–145)
TSH: 1.21 microintl units/mL (ref 0.35–5.50)
Total Bilirubin: 0.5 mg/dL (ref 0.3–1.2)
Total CHOL/HDL Ratio: 4
Total Protein: 7.4 g/dL (ref 6.0–8.3)
Triglycerides: 84 mg/dL (ref 0.0–149.0)
VLDL: 16.8 mg/dL (ref 0.0–40.0)
WBC: 6.9 10*3/uL (ref 4.5–10.5)

## 2009-06-27 ENCOUNTER — Ambulatory Visit: Payer: Self-pay | Admitting: Family Medicine

## 2009-06-27 DIAGNOSIS — M1990 Unspecified osteoarthritis, unspecified site: Secondary | ICD-10-CM | POA: Insufficient documentation

## 2009-06-27 HISTORY — DX: Unspecified osteoarthritis, unspecified site: M19.90

## 2009-06-27 LAB — CONVERTED CEMR LAB
Bilirubin Urine: NEGATIVE
Glucose, Urine, Semiquant: NEGATIVE
Ketones, urine, test strip: NEGATIVE
Nitrite: NEGATIVE
Protein, U semiquant: NEGATIVE
Specific Gravity, Urine: 1.02
Urobilinogen, UA: 1
WBC Urine, dipstick: NEGATIVE
pH: 6.5

## 2009-07-15 ENCOUNTER — Encounter: Admission: RE | Admit: 2009-07-15 | Discharge: 2009-07-15 | Payer: Self-pay | Admitting: Family Medicine

## 2009-08-18 ENCOUNTER — Ambulatory Visit: Payer: Self-pay | Admitting: Internal Medicine

## 2009-10-20 ENCOUNTER — Encounter: Admission: RE | Admit: 2009-10-20 | Discharge: 2009-10-20 | Payer: Self-pay | Admitting: Family Medicine

## 2009-10-20 ENCOUNTER — Telehealth: Payer: Self-pay | Admitting: Family Medicine

## 2009-10-24 ENCOUNTER — Ambulatory Visit: Payer: Self-pay | Admitting: Family Medicine

## 2009-11-09 ENCOUNTER — Ambulatory Visit: Payer: Self-pay | Admitting: Family Medicine

## 2009-11-09 DIAGNOSIS — R1032 Left lower quadrant pain: Secondary | ICD-10-CM

## 2009-11-09 HISTORY — DX: Left lower quadrant pain: R10.32

## 2009-11-09 LAB — CONVERTED CEMR LAB
Bilirubin Urine: NEGATIVE
Glucose, Urine, Semiquant: NEGATIVE
Ketones, urine, test strip: NEGATIVE
Nitrite: NEGATIVE
Protein, U semiquant: NEGATIVE
Specific Gravity, Urine: 1.02
Urobilinogen, UA: 0.2
WBC Urine, dipstick: NEGATIVE
pH: 7

## 2009-11-10 LAB — CONVERTED CEMR LAB
Basophils Absolute: 0 10*3/uL (ref 0.0–0.1)
Basophils Relative: 0.4 % (ref 0.0–3.0)
Eosinophils Absolute: 0.2 10*3/uL (ref 0.0–0.7)
Eosinophils Relative: 2.3 % (ref 0.0–5.0)
HCT: 41 % (ref 36.0–46.0)
Hemoglobin: 13.2 g/dL (ref 12.0–15.0)
Lymphocytes Relative: 38 % (ref 12.0–46.0)
Lymphs Abs: 2.8 10*3/uL (ref 0.7–4.0)
MCHC: 32.2 g/dL (ref 30.0–36.0)
MCV: 89.4 fL (ref 78.0–100.0)
Monocytes Absolute: 0.5 10*3/uL (ref 0.1–1.0)
Monocytes Relative: 6.9 % (ref 3.0–12.0)
Neutro Abs: 3.8 10*3/uL (ref 1.4–7.7)
Neutrophils Relative %: 52.4 % (ref 43.0–77.0)
Platelets: 293 10*3/uL (ref 150.0–400.0)
RBC: 4.58 M/uL (ref 3.87–5.11)
RDW: 12.9 % (ref 11.5–14.6)
WBC: 7.3 10*3/uL (ref 4.5–10.5)

## 2009-11-11 ENCOUNTER — Telehealth: Payer: Self-pay | Admitting: Family Medicine

## 2009-11-13 ENCOUNTER — Emergency Department (HOSPITAL_COMMUNITY): Admission: EM | Admit: 2009-11-13 | Discharge: 2009-11-13 | Payer: Self-pay | Admitting: Family Medicine

## 2009-11-14 ENCOUNTER — Telehealth: Payer: Self-pay | Admitting: Family Medicine

## 2009-11-14 ENCOUNTER — Ambulatory Visit: Payer: Self-pay | Admitting: Family Medicine

## 2009-11-14 ENCOUNTER — Inpatient Hospital Stay (HOSPITAL_COMMUNITY): Admission: AD | Admit: 2009-11-14 | Discharge: 2009-11-17 | Payer: Self-pay | Admitting: Family Medicine

## 2009-11-14 DIAGNOSIS — Z87448 Personal history of other diseases of urinary system: Secondary | ICD-10-CM | POA: Insufficient documentation

## 2009-11-14 HISTORY — DX: Personal history of other diseases of urinary system: Z87.448

## 2009-11-14 LAB — CONVERTED CEMR LAB
Bilirubin Urine: NEGATIVE
Glucose, Urine, Semiquant: NEGATIVE
Ketones, urine, test strip: NEGATIVE
Nitrite: NEGATIVE
Specific Gravity, Urine: 1.02
Urobilinogen, UA: 0.2
pH: 6.5

## 2009-11-18 ENCOUNTER — Emergency Department (HOSPITAL_COMMUNITY): Admission: EM | Admit: 2009-11-18 | Discharge: 2009-11-18 | Payer: Self-pay | Admitting: Emergency Medicine

## 2009-12-19 ENCOUNTER — Ambulatory Visit (HOSPITAL_COMMUNITY): Admission: RE | Admit: 2009-12-19 | Discharge: 2009-12-19 | Payer: Self-pay | Admitting: Urology

## 2009-12-28 ENCOUNTER — Emergency Department (HOSPITAL_COMMUNITY): Admission: EM | Admit: 2009-12-28 | Discharge: 2009-12-29 | Payer: Self-pay | Admitting: Emergency Medicine

## 2010-01-16 ENCOUNTER — Ambulatory Visit: Payer: Self-pay | Admitting: Family Medicine

## 2010-01-16 LAB — CONVERTED CEMR LAB
Bilirubin Urine: NEGATIVE
Glucose, Urine, Semiquant: NEGATIVE
Ketones, urine, test strip: NEGATIVE
Nitrite: NEGATIVE
Protein, U semiquant: NEGATIVE
Specific Gravity, Urine: 1.02
Urobilinogen, UA: 0.2
pH: 7

## 2010-01-17 ENCOUNTER — Ambulatory Visit (HOSPITAL_COMMUNITY): Admission: RE | Admit: 2010-01-17 | Discharge: 2010-01-17 | Payer: Self-pay | Admitting: Obstetrics and Gynecology

## 2010-03-20 ENCOUNTER — Ambulatory Visit: Payer: Self-pay | Admitting: Family Medicine

## 2010-09-18 ENCOUNTER — Telehealth: Payer: Self-pay | Admitting: Family Medicine

## 2010-09-19 ENCOUNTER — Encounter
Admission: RE | Admit: 2010-09-19 | Discharge: 2010-09-19 | Payer: Self-pay | Source: Home / Self Care | Attending: Family Medicine | Admitting: Family Medicine

## 2010-10-12 NOTE — Progress Notes (Signed)
Summary: pt still has vertigo and now pain also Req motion sickness patch  Phone Note Call from Patient Call back at Home Phone [phone]   Caller: Patient Call For: Roderick Pee MD Summary of Call: Pt is still having some problems with vertigo and now pt says she is having pain in various parts of her body. Pt would like a call back. Pt needs to get the motion sickness patch called in to [facility].  Initial call taken by: Lucy Antigua,  June 03, 2009 9:04 AM    New/Updated Medications: TRANSDERM-SCOP 1.5 MG PT72 (SCOPOLAMINE BASE) 1 q 3 days Prescriptions: TRANSDERM-SCOP 1.5 MG PT72 (SCOPOLAMINE BASE) 1 q 3 days  #3 x 1   Entered and Authorized by:   Roderick Pee MD   Signed by:   Roderick Pee MD on 06/03/2009   Method used:   Electronically to        [facility] [address] (retail)       [phone]       [place], [place]  [address]       Ph: [phone]       Fax: [phone]   RxID:   [id number]

## 2010-10-12 NOTE — Assessment & Plan Note (Signed)
Summary: PAIN IN HEAD/MHF   Vital Signs:  Patient profile:   60 year old female Height:      66 inches Weight:      256 pounds BMI:     41.47 Temp:     98.4 degrees F oral BP sitting:   120 / 78  (left arm) Cuff size:   regular  Vitals Entered By: Kern Reap CMA (March 21, 2009 9:06 AM)  Reason for Visit right shoulder, arm, leg pain  History of Present Illness: Summer [name] is a 60 year old female, who comes in today for evaluation of hip pain.  She states about 3 months ago.  She began having hip pain.  No history of trauma.  She describes a sharp, a 10 on a scale of one to 10, and it comes and goes.  It is worse in the evening and is worse when she walks.  She has no neurologic symptoms.  Again, no history of trauma.  She was seen by [facility] for orthopedic evaluation.  A year ago.  She does not recall the diagnosis.  She was told to come back for follow-up.  However, she never returned.  She continues to have symptoms of trigeminal neuralgia, however, there fairly quiet  Allergies: 1)  ! Cleocin  Past History:  Past medical, surgical, family and social histories (including risk factors) reviewed, and no changes noted (except as noted below).  Past Medical History: Reviewed history from 08/26/2008 and no changes required. Diabetes mellitus, type II Hypertension Diverticulitis, hx of ruptured diverticulum 2007 colostomy with minowreversal Allergic rhinitis asymptomatic hematuria  Past Surgical History: Reviewed history from 05/09/2007 and no changes required. Hysterectomy, ovaries ok 1998 Tonsillectomy, adenoidectomy back pain 2004 CB X1  Family History: Reviewed history and no changes required.  Social History: Reviewed history from 06/25/2008 and no changes required. Occupation: Married Never Smoked Alcohol use-no Drug use-no Regular exercise-no  Review of Systems      See HPI  Physical Exam  General:  Well-developed,well-nourished,in no acute  distress; alert,appropriate and cooperative throughout examination Msk:  No deformity or scoliosis noted of thoracic or lumbar spine.   Pulses:  R and L carotid,radial,femoral,dorsalis pedis and posterior tibial pulses are full and equal bilaterally Extremities:  No clubbing, cyanosis, edema, or deformity noted with normal full range of motion of all joints.   Neurologic:  No cranial nerve deficits noted. Station and gait are normal. Plantar reflexes are down-going bilaterally. DTRs are symmetrical throughout. Sensory, motor and coordinative functions appear intact.   Impression & Recommendations:  Problem # 1:  HIP PAIN, RIGHT (ICD-719.45) Assessment New  Her updated medication list for this problem includes:    Flexeril 10 Mg Tabs (Cyclobenzaprine hcl) .Marland Kitchen... 1 tab @ bedtime    Vicodin Es 7.5-750 Mg Tabs (Hydrocodone-acetaminophen) .Marland Kitchen... 1 tab @ bedtime  Complete Medication List: 1)  Flonase 50 Mcg/act Susp (Fluticasone propionate) .... One shot r&l nostril at bedtime 2)  Flexeril 10 Mg Tabs (Cyclobenzaprine hcl) .Marland Kitchen.. 1 tab @ bedtime 3)  Vicodin Es 7.5-750 Mg Tabs (Hydrocodone-acetaminophen) .Marland Kitchen.. 1 tab @ bedtime  Patient Instructions: 1)  call [facility] for an orthopedic evaluation.  If the orthopedic evaluation is negative.  They have a rheumatologist, who works with him that would be a next option. 2)  In the meantime take 600 mg of Motrin twice a day with food and u may also take a half of a Flexeril and a half of a pain pill at bedtime as needed for severe  pain. Prescriptions: VICODIN ES 7.5-750 MG TABS (HYDROCODONE-ACETAMINOPHEN) 1 tab @ bedtime  #30 x 2   Entered and Authorized by:   Roderick Pee MD   Signed by:   Roderick Pee MD on 03/21/2009   Method used:   Print then Give to Patient   RxID:   [id number] FLEXERIL 10 MG TABS (CYCLOBENZAPRINE HCL) 1 tab @ bedtime  #30 x 2   Entered and Authorized by:   Roderick Pee MD   Signed by:   Roderick Pee MD on 03/21/2009    Method used:   Printed then faxed to ...       [facility]  [address] [phone]* (retail)       [address]       [place], [place]  [id number]       Ph: [phone] or [phone]       Fax: [phone]   RxID:   [id number]

## 2010-10-12 NOTE — Progress Notes (Signed)
  Phone Note Outgoing Call Call back at [facility] Phone [phone]   Call placed by: Dr Caryl Never Summary of Call: Spoke with patient.  Pain slightly improved.  Urology referral made (for persistent microscopic hematuria) and pt encouraged to f/u with Dr Tawanna Cooler next week. Initial call taken by: Evelena Peat MD,  November 11, 2009 8:11 AM

## 2010-10-12 NOTE — Progress Notes (Signed)
Summary: Pt called needing referral to spec re: severe headaches  Phone Note Call from Patient Call back at [phone] cell   Caller: Patient Summary of Call: Pt wants to know who Dr. Tawanna Cooler would recommend as a specialist to see her re: Lightheadness and wooziness, severe headaches at night. Pls advise.  Initial call taken by: Lucy Antigua,  September 18, 2010 12:12 PM  Follow-up for Phone Call        D. Adelman, headache clinic////////// she can call and make her own appointment just tell them that we referred her....Dr.Lolley Follow-up by: Roderick Pee MD,  September 18, 2010 12:43 PM  Additional Follow-up for Phone Call Additional follow up Details #1::        pt has # Additional Follow-up by: Heron Sabins,  September 19, 2010 12:14 PM

## 2010-10-12 NOTE — Assessment & Plan Note (Signed)
Summary: pt has xray at [facility] was told by dr gates she has ...   Vital Signs:  Patient profile:   60 year old female Weight:      252 pounds Temp:     99.0 degrees F oral BP sitting:   121 / 80  (left arm) Cuff size:   regular  Vitals Entered By: Kern Reap CMA Duncan Dull) (October 24, 2009 11:58 AM)  Reason for Visit enlarged heart  Primary Care Provider:  [name],MD   History of Present Illness: Rhythm is a 60 year old, married female, nonsmoker, who comes in today for evaluation of back pain.  She, states she's had back pain off and on for at least a year.  On Friday.  She called for evaluation of her back pain.  We apparently did not have the time that was convenient for her.  Therefore, she went to another doctor, Dr. Kevan Ny examined.  There ordered a chest x-ray, which, according to report shows mild cardiomegaly.  She's here today for evaluation of the abnormal chest x-ray.  Shehas had  no history of cardiac disease.  She takes no medication on a regular basis.  BP 112/80  Allergies: 1)  ! Cleocin  Past History:  Past medical, surgical, family and social histories (including risk factors) reviewed, and no changes noted (except as noted below).  Past Medical History: Reviewed history from 08/18/2009 and no changes required. Diverticulitis, hx of  Allergic rhinitis asymptomatic hematuria Hypertension  Past Surgical History: Reviewed history from 08/18/2009 and no changes required. Hysterectomy, ovaries ok 1998 Tonsillectomy, adenoidectomy back pain 2004 CB X1 segmental colon resection for diverticulitis with Hartmann's procedure Colostomy reversal  Family History: Reviewed history from 08/18/2009 and no changes required. No FH of Colon Cancer:  Social History: Reviewed history from 08/18/2009 and no changes required. single Never Smoked Alcohol use-no  rarely Drug use-no Regular exercise-no 1 child  Review of Systems      See  HPI  Physical Exam  General:  Well-developed,well-nourished,in no acute distress; alert,appropriate and cooperative throughout examination Neck:  No deformities, masses, or tenderness noted. Chest Wall:  No deformities, masses, or tenderness noted. Lungs:  Normal respiratory effort, chest expands symmetrically. Lungs are clear to auscultation, no crackles or wheezes. Heart:  Normal rate and regular rhythm. S1 and S2 normal without gallop, murmur, click, rub or other extra sounds.   Impression & Recommendations:  Problem # 1:  NONSPCIFC ABN FINDING RAD & OTH EXAM LUNG FIELD (ICD-793.1) Assessment New  Complete Medication List: 1)  Flonase 50 Mcg/act Susp (Fluticasone propionate) .... One shot r&l nostril at bedtime 2)  Flexeril 10 Mg Tabs (Cyclobenzaprine hcl) .Marland Kitchen.. 1 tab @ bedtime 3)  Vicodin Es 7.5-750 Mg Tabs (Hydrocodone-acetaminophen) .Marland Kitchen.. 1 tab @ bedtime  Other Orders: EKG w/ Interpretation (93000)  Patient Instructions: 1)  your cardiac exam and EKG are normal.  Since you are well I would not recommend any further diagnostic studies at this point

## 2010-10-12 NOTE — Assessment & Plan Note (Signed)
Summary: SHOULDER/KNEE PAIN/NJR   Vital Signs:  Patient Profile:   60 Years Old Female Height:     66 inches Weight:      231 pounds BP sitting:   148 / 68  (right arm)  Pt. in pain?   yes    Location:   b shoulder,knees,hip joints    Intensity:   10    Type:       burning/aching  Vitals Entered By: Arcola Jansky, RN (April 22, 2007 9:51 AM)                Chief Complaint:  left foot  swelling-6 months.  History of Present Illness: Mrs. Atwater is here today for evaluation of swelling in her left leg.  She, said she's noticed for about 5 months.  She recalls no history of trauma.  She's never had any DVT.  Her weight is 10 to 26 and is gone up to 231.  She stopped taking her blood pressure medicine 3 weeks ago for reasons of unknown.  She says she is not on a salt free diet.  She liberally at [employer] in her cooking and or table food.  She continues to see Dr. Venda Rodes for neurologic evaluation because of underlying chronic pain syndrome.  Acute Visit History:      She denies abdominal pain, chest pain, constipation, cough, diarrhea, earache, eye symptoms, fever, genitourinary symptoms, headache, musculoskeletal symptoms, nasal discharge, nausea, rash, sinus problems, sore throat, and vomiting.         Current Allergies: ! CLEOCIN     Review of Systems      See HPI   Physical Exam  General:     Well-developed,well-nourished,in no acute distress; alert,appropriate and cooperative throughout examination Msk:     No deformity or scoliosis noted of thoracic or lumbar spine.   Pulses:     R and L carotid,radial,femoral,dorsalis pedis and posterior tibial pulses are full and equal bilaterally Extremities:     she has 1+ edema in both lower extremities.  The left is not greater than the right. Neurologic:     No cranial nerve deficits noted. Station and gait are normal. Plantar reflexes are down-going bilaterally. DTRs are symmetrical throughout. Sensory,  motor and coordinative functions appear intact. 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 1 tab two times a day   Complete Medication List: 1)  Lopressor Hct 50-25 Mg Tabs (Metoprolol-hydrochlorothiazide) .Marland Kitchen.. 1 tab two times a day 2)  Gabapentin 300 Mg Tabs (Gabapentin) .... 3 tabs am 2 tabs noon 2 tabs hs 3)  Oxycodone-acetaminophen 5-325 Mg Tabs (Oxycodone-acetaminophen) .Marland Kitchen.. 1-2 tab q6-8 hrs as needed pain   Patient Instructions: 1)  he actually has swelling in both feet.  Blood pressures running 148/68 and your weight is gone up 5 pounds.  Having been off her blood pressure medicine for 3 weeks.  I think is the main issue.  Left groin and restart the Tenoretic 5020 51 tablet a day stay in a salt free diet.  Check your blood pressure morning and evening for the next 3 weeks.  If her blood pressure drops back to normal, i.e., the top number less than 135, and the bottom number less than 85, and the  swelling goes away.  There were good shape.  Continue that regime.  If however, the blood pressure does not drop back to normal or the swelling does not dissipate then we would need to see her for reevaluation. 2)  CBC, Bmet, lipid panel, urinalysis, TSH level, hepatic panel, prior to next visit (v70.0)     Prescriptions: LOPRESSOR HCT 50-25 MG  TABS (METOPROLOL-HYDROCHLOROTHIAZIDE) 1 tab two times a day  #100 x 3   Entered and Authorized by:   Roderick Pee MD   Signed by:   Roderick Pee MD on 04/22/2007   Method used:   Electronically sent to ...       [phone] [address]       [address]       [place], [place]  [address]       Ph: [phone] or [phone]       Fax:  [phone]   RxID:   [id number]

## 2010-10-12 NOTE — Progress Notes (Signed)
Summary: flexeril refill  Phone Note From Pharmacy   Summary of Call: patient would like a refill of flexeril is this okay to fill? Initial call taken by: Kern Reap CMA Duncan Dull),  October 20, 2009 4:08 PM  Follow-up for Phone Call         Flexeril, 10 mg, dispense 30 tablets, one nightly p.r.n. back pain, refills x 3 Follow-up by: Roderick Pee MD,  October 20, 2009 5:20 PM  Additional Follow-up for Phone Call Additional follow up Details #1::        rx sent Additional Follow-up by: Kern Reap CMA Duncan Dull),  October 20, 2009 5:42 PM

## 2010-10-12 NOTE — Assessment & Plan Note (Signed)
Summary: VERTIGO/CDJ   Vital Signs:  Patient profile:   60 year old female Weight:      253 pounds Temp:     98.6 degrees F oral Pulse rate:   76 / minute BP sitting:   126 / 80  (left arm)  Vitals Entered By: Doristine Devoid (May 28, 2009 10:22 AM)  CC: vertigo sx x2 days    History of Present Illness: Patient seen Saturday morning working clinic with onset 2 nights ago of vertigo symptoms she first noticed when getting up to the bathroom. Symptoms intermittent since then. No syncope. She has had some nausea but no vomiting. No hearing changes. History tried trigeminal neuralgia right side of face. Denies any extremity weakness or headaches. No ataxia symptoms.  Allergies: 1)  ! Cleocin  Past History:  Past Medical History: Last updated: 08/26/2008 Diabetes mellitus, type II Hypertension Diverticulitis, hx of ruptured diverticulum 2007 colostomy with minowreversal Allergic rhinitis asymptomatic hematuria  Review of Systems  The patient denies anorexia, fever, weight loss, vision loss, decreased hearing, syncope, and headaches.    Physical Exam  General:  patient's alert in no distress Head:  Normocephalic and atraumatic without obvious abnormalities. No apparent alopecia or balding. Eyes:  No corneal or conjunctival inflammation noted. EOMI. Perrla. Funduscopic exam benign, without hemorrhages, exudates or papilledema. Vision grossly normal. Ears:  External ear exam shows no significant lesions or deformities.  Otoscopic examination reveals clear canals, tympanic membranes are intact bilaterally without bulging, retraction, inflammation or discharge. Hearing is grossly normal bilaterally. Mouth:  Oral mucosa and oropharynx without lesions or exudates.  Teeth in good repair. Neck:  supple no masses Lungs:  Normal respiratory effort, chest expands symmetrically. Lungs are clear to auscultation, no crackles or wheezes. Heart:  Normal rate and regular rhythm. S1 and S2  normal without gallop, murmur, click, rub or other extra sounds. Neurologic:  alert & oriented X3, cranial nerves II-XII intact, strength normal in all extremities, sensation intact to light touch, and finger-to-nose normal.  no nystagmus noted. Vertigo symptoms reproduced with patient placed supine on the exam table.   Impression & Recommendations:  Problem # 1:  INTERMITTENT VERTIGO (ICD-780.4)  suspect benign positional vertigo. Meclizine for symptomatic relief. If no better next 1-2 weeks consider referral for vestibular exercises.  Her updated medication list for this problem includes:    Antivert 25 Mg Tabs (Meclizine hcl) ..... One by mouth q 6 hours as needed nausea and vertigo  Complete Medication List: 1)  Flonase 50 Mcg/act Susp (Fluticasone propionate) .... One shot r&l nostril at bedtime 2)  Flexeril 10 Mg Tabs (Cyclobenzaprine hcl) .Marland Kitchen.. 1 tab @ bedtime 3)  Vicodin Es 7.5-750 Mg Tabs (Hydrocodone-acetaminophen) .Marland Kitchen.. 1 tab @ bedtime 4)  Antivert 25 Mg Tabs (Meclizine hcl) .... One by mouth q 6 hours as needed nausea and vertigo  Patient Instructions: 1)  No driving until symptoms clear. 2)  Be sure to get up and change positions very slowly to avoid risk of fall. Prescriptions: ANTIVERT 25 MG TABS (MECLIZINE HCL) one by mouth q 6 hours as needed nausea and vertigo  #30 x 0   Entered and Authorized by:   Evelena Peat MD   Signed by:   Evelena Peat MD on 05/28/2009   Method used:   [facility]        [facility]  [address] [phone][facility] (retail)       [address]       [place]  [place], [place]  [id number]       Ph: [phone] or [phone]       Fax: [phone]   RxID:   [id number]

## 2010-10-12 NOTE — Assessment & Plan Note (Signed)
Summary: follow up sinsu and blood in urine/jls   Vital Signs:  Patient Profile:   60 Years Old Female Height:     66 inches (167.64 cm) Weight:      249 pounds Temp:     100.0 degrees F oral BP sitting:   116 / 78  (left arm) Cuff size:   large  Vitals Entered By: Kern Reap CMA (August 26, 2008 2:50 PM)                 Chief Complaint:  sinus infection and hematuria.  History of Present Illness: Summer [name] is a 60 year old female, who comes in today for evaluation of two problems.  We had seen her a couple weeks ago for a physical examination and she was found to have a moderate amount of blood in her urine.  She was asymptomatic.  She was given Septra DS b.i.d. for 3 weeks and comes back today for follow-up.  She remains asymptomatic.  Urine shows a trace to small amount of blood.  I  She also has seasonal allergic rhinitis is worse in the winter.  She has more symptoms in the right frontal and right maxillary area, and she does left side.  She's had no history of any nasal trauma.  She's had no fever, et Karie Soda.  Review of systems negative.  Environmental review of systems negative specifically, no dogs or cats in the house.  I she's tried over-the-counter Claritin without much success    Updated Prior Medication List: No Medications Current Allergies (reviewed today): ! CLEOCIN  Past Medical History:    Reviewed history from 06/13/2007 and no changes required:       Diabetes mellitus, type II       Hypertension       Diverticulitis, hx of ruptured diverticulum 2007 colostomy with minowreversal       Allergic rhinitis       asymptomatic hematuria   Social History:    Reviewed history from 06/25/2008 and no changes required:       Occupation:       Married       Never Smoked       Alcohol use-no       Drug use-no       Regular exercise-no    Review of Systems      See HPI   Physical Exam  General:     Well-developed,well-nourished,in no acute  distress; alert,appropriate and cooperative throughout examination Head:     Normocephalic and atraumatic without obvious abnormalities. No apparent alopecia or balding. Eyes:     No corneal or conjunctival inflammation noted. EOMI. Perrla. Funduscopic exam benign, without hemorrhages, exudates or papilledema. Vision grossly normal. Ears:     External ear exam shows no significant lesions or deformities.  Otoscopic examination reveals clear canals, tympanic membranes are intact bilaterally without bulging, retraction, inflammation or discharge. Hearing is grossly normal bilaterally. Nose:     septum in the midline.  Bilateral nasal edema, worse on the right than the left Mouth:     Oral mucosa and oropharynx without lesions or exudates.  Teeth in good repair. Neck:     No deformities, masses, or tenderness noted.    Impression & Recommendations:  Problem # 1:  ALLERGIC RHINITIS (ICD-477.9) Assessment: Deteriorated  Her updated medication list for this problem includes:    Flonase 50 Mcg/act Susp (Fluticasone propionate) ..... One shot r&l nostril at bedtime   Problem # 2:  HEMATURIA UNSPECIFIED (ICD-599.70)  Assessment: Improved  The following medications were removed from the medication list:    Septra Ds 800-160 Mg Tabs (Sulfamethoxazole-trimethoprim) .Marland Kitchen... Take 1 tablet by mouth two times a day  Her updated medication list for this problem includes:    Septra Ds 800-160 Mg Tabs (Sulfamethoxazole-trimethoprim) .Marland Kitchen... Take 1 tablet by mouth two times a day  Orders: UA Dipstick w/o Micro (manual) (10932)   Complete Medication List: 1)  Septra Ds 800-160 Mg Tabs (Sulfamethoxazole-trimethoprim) .... Take 1 tablet by mouth two times a day 2)  Flonase 50 Mcg/act Susp (Fluticasone propionate) .... One shot r&l nostril at bedtime   Patient Instructions: 1)  take Septra DS one twice a day until the bottle is empty.  Return in 5 weeks for follow-up. 2)  Take 10 mg of Zyrtec D. at  bedtime, and one shot of the steroid nasal spray up each nostril at bedtime for the allergic rhinitis   Prescriptions: FLONASE 50 MCG/ACT SUSP (FLUTICASONE PROPIONATE) one shot R&L nostril at bedtime  #2 x 6   Entered and Authorized by:   Roderick Pee MD   Signed by:   Roderick Pee MD on 08/26/2008   Method used:   [facility]        [facility] [address] [phone]* (retail)       [address]       [place], [place]  [address]       Ph: [phone] or [phone]       Fax: [phone]   RxID:   [id number] SEPTRA DS 800-160 MG TABS (SULFAMETHOXAZOLE-TRIMETHOPRIM) Take 1 tablet by mouth two times a day  #50 x 0   Entered and Authorized by:   Roderick Pee MD   Signed by:   Roderick Pee MD on 08/26/2008   Method used:   [facility]        [facility] [facility] [phone]* (retail)       [address]       [place], [place]  [address]       Ph: [phone] or [phone]       Fax: [phone]   RxID:   [id number]  ] Laboratory Results   Urine Tests    Routine Urinalysis   Color: yellow Appearance: Clear Glucose: negative   (Normal Range: Negative) Bilirubin: negative   (Normal Range: Negative) Ketone: negative   (Normal Range: Negative) Spec. Gravity: 1.020   (Normal Range: 1.003-1.035) Blood: small   (Normal Range: Negative) pH: 6.0   (Normal Range: 5.0-8.0) Protein: trace   (Normal Range: Negative) Urobilinogen: 0.2   (Normal Range: 0-1) Nitrite: negative   (Normal Range: Negative) Leukocyte Esterace: negative   (Normal Range: Negative)

## 2010-10-12 NOTE — Assessment & Plan Note (Signed)
Summary: hematuria/cdw   Vital Signs:  Patient profile:   60 year old female Temp:     101.8 degrees F oral BP sitting:   163 / 90  (left arm) Cuff size:   regular  Vitals Entered By: Kern Reap CMA Duncan Dull) (November 14, 2009 9:58 AM)  Primary Care Provider:  [name],MD   History of Present Illness: C. 60 year-old, married female, nonsmoker, who comes in today for evaluation of fever, chills, back pain, hematuria.  Her history dates back to last Wednesday when she was seen here by Dr. Caryl Never for evaluation of abdominal pain.  At that time he felt that she might have diverticulitis.  He put her on a clear liquid diet start her on Cipro and Flagyl.  Her symptoms were unchanged on Thursday and Friday.  She began having diarrhea.  She describes as dark in 8 to 10 bowel movements a day.  Saturday was the same...Marland KitchenMarland Kitchenas the Sunday she began having fever, chills, and bright red blood in her urine.  She went to the emergency room had an evaluation which was negative except for blood in the urine.  They added Macrodantin and told her to see [place] today for follow-up.  With the history of abdominal pain blood in urine, fever, chills, unresponsive to oral antbio....... I think she requires hospitalization.  I called the [facility], hospitalist the physician called back immediately.  She  was very nice and agreed to take her for evaluation.  Allergies: 1)  ! Cleocin  Past History:  Past medical, surgical, family and social histories (including risk factors) reviewed, and no changes noted (except as noted below).  Past Medical History: Reviewed history from 08/18/2009 and no changes required. Diverticulitis, hx of  Allergic rhinitis asymptomatic hematuria Hypertension  Past Surgical History: Reviewed history from 08/18/2009 and no changes required. Hysterectomy, ovaries ok 1998 Tonsillectomy, adenoidectomy back pain 2004 CB X1 segmental colon resection for diverticulitis with Hartmann's  procedure Colostomy reversal  Family History: Reviewed history from 08/18/2009 and no changes required. No FH of Colon Cancer:  Social History: Reviewed history from 08/18/2009 and no changes required. single Never Smoked Alcohol use-no  rarely Drug use-no Regular exercise-no 1 child  Review of Systems      See HPI  Physical Exam  General:  Well-developed,well-nourished,in no acute distress; alert,appropriate and cooperative throughout examination   Impression & Recommendations:  Problem # 1:  HEMATURIA, HX OF (ICD-V13.09) Assessment Deteriorated  Orders: UA Dipstick w/o Micro (manual) (16109)  Problem # 2:  ABDOMINAL PAIN, LEFT LOWER QUADRANT (ICD-789.04) Assessment: Unchanged  Orders: UA Dipstick w/o Micro (manual) (60454)  Problem # 3:  DIARRHEA (ICD-787.91) Assessment: New  Problem # 4:  FEVER UNSPECIFIED (ICD-780.60) Assessment: New  Complete Medication List: 1)  Flonase 50 Mcg/act Susp (Fluticasone propionate) .... One shot r&l nostril at bedtime 2)  Flexeril 10 Mg Tabs (Cyclobenzaprine hcl) .Marland Kitchen.. 1 tab @ bedtime 3)  Vicodin Es 7.5-750 Mg Tabs (Hydrocodone-acetaminophen) .Marland Kitchen.. 1 tab @ bedtime 4)  Ciprofloxacin Hcl 500 Mg Tabs (Ciprofloxacin hcl) .... One by mouth two times a day for 10 days 5)  Metronidazole 500 Mg Tabs (Metronidazole) .... One by mouth bid for 10 days  Patient Instructions: 1)  go directly to the [place] long the admitting office  Laboratory Results   Urine Tests  Date/Time Received: November 14, 2009   Routine Urinalysis   Color: yellow Appearance: Hazy Glucose: negative   (Normal Range: Negative) Bilirubin: negative   (Normal Range: Negative)  Ketone: negative   (Normal Range: Negative) Spec. Gravity: 1.020   (Normal Range: 1.003-1.035) Blood: large   (Normal Range: Negative) pH: 6.5   (Normal Range: 5.0-8.0) Protein: trace   (Normal Range: Negative) Urobilinogen: 0.2   (Normal Range: 0-1) Nitrite: negative   (Normal Range:  Negative) Leukocyte Esterace: trace   (Normal Range: Negative)    Comments: Kern Reap CMA (AAMA)  November 14, 2009 10:03 AM

## 2010-10-12 NOTE — Assessment & Plan Note (Signed)
Summary: CPX- NO PAP//CCM   Vital Signs:  Patient profile:   60 year old female Height:      66 inches Weight:      258 pounds Temp:     98.7 degrees F oral BP sitting:   180 / 70  (left arm) Cuff size:   large  Vitals Entered By: Kern Reap CMA Duncan Dull) (June 27, 2009 2:54 PM)  Reason for Visit cpx  History of Present Illness: Summer [name] is a 60 year old female, who comes in today for evaluation of joint pain, allergic rhinitis obesity, and hematuria.  She has symmetrical joint pain involving her back, shoulders, knees, ankles, and feet.  Has been going on for months, sometimes years.  It comes and goes.  Her weight is 258 pounds.  She is not taking medication.  She does have allergic rhinitis, and she uses Flonase, and OTC Claritin.  She is not taking her medication now.  She says her worse time is in the fall.  Her weight is unchanged.  She continues to have hematuria.  2+.  Would encourage her to see the urologist last year.  However, she never went.  Again, will encourage her to go see the urologist to have an evaluation.  She is reaching I2 dental care is BSE monthly and gets annual mammography.  Colonoscopy normal and GI.  Tetanus booster 2000.  We will give her a tetanus booster and a flu shot today.  Allergies: 1)  ! Cleocin  Past History:  Past medical, surgical, family and social histories (including risk factors) reviewed, and no changes noted (except as noted below).  Past Medical History: Reviewed history from 08/26/2008 and no changes required. Diabetes mellitus, type II Hypertension Diverticulitis, hx of ruptured diverticulum 2007 colostomy with minowreversal Allergic rhinitis asymptomatic hematuria  Past Surgical History: Reviewed history from 05/09/2007 and no changes required. Hysterectomy, ovaries ok 1998 Tonsillectomy, adenoidectomy back pain 2004 CB X1  Family History: Reviewed history and no changes required.  Social History: Reviewed  history from 06/25/2008 and no changes required. Occupation: Married Never Smoked Alcohol use-no Drug use-no Regular exercise-no  Review of Systems      See HPI  Physical Exam  General:  Well-developed,well-nourished,in no acute distress; alert,appropriate and cooperative throughout examination Head:  Normocephalic and atraumatic without obvious abnormalities. No apparent alopecia or balding. Eyes:  No corneal or conjunctival inflammation noted. EOMI. Perrla. Funduscopic exam benign, without hemorrhages, exudates or papilledema. Vision grossly normal. Ears:  External ear exam shows no significant lesions or deformities.  Otoscopic examination reveals clear canals, tympanic membranes are intact bilaterally without bulging, retraction, inflammation or discharge. Hearing is grossly normal bilaterally. Nose:  External nasal examination shows no deformity or inflammation. Nasal mucosa are pink and moist without lesions or exudates. Mouth:  Oral mucosa and oropharynx without lesions or exudates.  Teeth in good repair. Neck:  No deformities, masses, or tenderness noted. Chest Wall:  No deformities, masses, or tenderness noted. Breasts:  No mass, nodules, thickening, tenderness, bulging, retraction, inflamation, nipple discharge or skin changes noted.   Lungs:  Normal respiratory effort, chest expands symmetrically. Lungs are clear to auscultation, no crackles or wheezes. Heart:  Normal rate and regular rhythm. S1 and S2 normal without gallop, murmur, click, rub or other extra sounds. Abdomen:  Bowel sounds positive,abdomen soft and non-tender without masses, organomegaly or hernias noted. Msk:  No deformity or scoliosis noted of thoracic or lumbar spine.   Pulses:  R and L carotid,radial,femoral,dorsalis pedis and posterior tibial pulses  are full and equal bilaterally Extremities:  No clubbing, cyanosis, edema, or deformity noted with normal full range of motion of all joints.   Neurologic:  No  cranial nerve deficits noted. Station and gait are normal. Plantar reflexes are down-going bilaterally. DTRs are symmetrical throughout. Sensory, motor and coordinative functions appear intact. Skin:  Intact without suspicious lesions or rashes Cervical Nodes:  No lymphadenopathy noted Axillary Nodes:  No palpable lymphadenopathy Inguinal Nodes:  No significant adenopathy Psych:  Cognition and judgment appear intact. Alert and cooperative with normal attention span and concentration. No apparent delusions, illusions, hallucinations  Diabetes Management Exam:    Foot Exam (with socks and/or shoes not present):       Sensory-Pinprick/Light touch:          Left medial foot (L-4): normal          Left dorsal foot (L-5): normal          Left lateral foot (S-1): normal          Right medial foot (L-4): normal          Right dorsal foot (L-5): normal          Right lateral foot (S-1): normal       Sensory-Monofilament:          Left foot: normal          Right foot: normal       Inspection:          Left foot: normal          Right foot: normal       Nails:          Left foot: normal          Right foot: normal    Eye Exam:       Eye Exam done elsewhere          Date: 06/01/2008          Results: normal          Done by: opth    Impression & Recommendations:  Problem # 1:  ALLERGIC RHINITIS (ICD-477.9) Assessment Deteriorated  Her updated medication list for this problem includes:    Flonase 50 Mcg/act Susp (Fluticasone propionate) ..... 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 1 tab @ bedtime  Orders: Prescription Created Electronically [phone])  Complete Medication List: 1)  Flonase 50 Mcg/act Susp (Fluticasone propionate) .... One shot r&l nostril at bedtime 2)  Flexeril 10 Mg Tabs (Cyclobenzaprine hcl) .Marland Kitchen.. 1 tab @ bedtime 3)  Vicodin Es 7.5-750 Mg Tabs (Hydrocodone-acetaminophen) .Marland Kitchen.. 1 tab @ bedtime 4)  Antivert 25 Mg Tabs (Meclizine hcl) .... One by mouth q 6 hours as needed nausea and vertigo 5)  Transderm-scop 1.5 Mg Pt72 (Scopolamine base) .Marland Kitchen.. 1 q 3 days  Other Orders: EKG w/ Interpretation (93000) Tdap => 73yrs IM (56213) Admin 1st Vaccine (08657)  Patient Instructions: 1)  call the urology Center set up an appointment for a urologic consult because of the blood in urine. 2)  Begin Motrin 600 mg twice a day with food 3)  Please schedule a follow-up appointment in 1 year. 4)  It is important that you exercise regularly at least 20 minutes 5 times a week. If you develop chest pain, have severe difficulty breathing, or feel very tired , stop exercising immediately and seek medical attention. 5)  You need to lose weight. Consider a lower calorie diet and regular exercise.  6)  Schedule your mammogram. 7)  Schedule a colonoscopy/sigmoidoscopy to help detect colon cancer. 8)  Take an Aspirin every day. Prescriptions: FLONASE 50 MCG/ACT SUSP (FLUTICASONE PROPIONATE) one shot R&L nostril at bedtime  #2 x 6   Entered and Authorized by:   Roderick Pee MD   Signed by:   Roderick Pee MD on 06/27/2009   Method used:   [facility]        [facility] [address] [phone]* (retail)       [address]       [place], [place]  [id number]       Ph: [phone] or [phone]       Fax: [phone]   RxID:   [id number] FLONASE 50 MCG/ACT SUSP (FLUTICASONE PROPIONATE) one shot R&L nostril at bedtime  #2 x 6   Entered and Authorized by:   Roderick Pee MD   Signed by:    Roderick Pee MD on 06/27/2009   Method used:   Electronically to        [facility] [address] (retail)       [phone]       [place], [place]  [address]       Ph: [phone]       Fax: [phone]   RxID:   [id number]    Immunizations Administered:  Tetanus Vaccine:    Vaccine Type: Tdap    Site: right deltoid    Mfr: GlaxoSmithKline    Dose: 0.5 ml    Route: IM    Given by: Kern Reap CMA (AAMA)    Exp. Date: 03/26/2011    Lot #: [id number]    Physician counseled: yes   Laboratory Results   Urine Tests  Date/Time Received: June 27, 2009   Routine Urinalysis   Color: yellow Appearance: Clear Glucose: negative   (Normal Range: Negative) Bilirubin: negative   (Normal Range: Negative) Ketone: negative   (Normal Range: Negative) Spec. Gravity: 1.020   (Normal Range: 1.003-1.035) Blood: moderate   (Normal Range: Negative) pH: 6.5   (Normal Range: 5.0-8.0) Protein: negative   (Normal Range: Negative) Urobilinogen: 1.0   (Normal Range: 0-1) Nitrite: negative   (Normal Range: Negative) Leukocyte Esterace: negative   (Normal Range: Negative)    Comments: Kern Reap CMA (AAMA)  June 27, 2009 3:10 PM

## 2010-10-12 NOTE — Procedures (Signed)
Summary: Colonoscopy: Diverticulosis, Hemorrhoids   Colonoscopy  Procedure date:  08/17/2003  Findings:      Results: Hemorrhoids.     Results: Diverticulosis.       Location:  [facility].   Patient Name: Summer [name], Summer [name] MRN:  Procedure Procedures: Colonoscopy CPT: 40981.  Personnel: Endoscopist: Iva Boop, MD, [facility].  Referred By: Eugenio Hoes Tawanna Cooler, MD.  Exam Location: Exam performed in Outpatient Clinic. Outpatient  Patient Consent: Procedure, Alternatives, Risks and Benefits discussed, consent obtained, from patient. Consent was obtained by the RN.  Indications  Average Risk Screening Routine.  History  Current Medications: Patient is not currently taking Coumadin.  Pre-Exam Physical: Performed Aug 17, 2003. Cardio-pulmonary exam, Rectal exam, HEENT exam , Abdominal exam, Mental status exam WNL.  Exam Exam: Extent of exam reached: Cecum, extent intended: Cecum.  The cecum was identified by appendiceal orifice and IC valve. Patient position: on left side. Colon retroflexion performed. Images taken. ASA Classification: I. Tolerance: fair, adequate exam.  Monitoring: Pulse and BP monitoring, Oximetry used. Supplemental O2 given.  Colon Prep Used Visicol for colon prep. Prep results: fair, adequate exam.  Sedation Meds: Patient assessed and found to be appropriate for moderate (conscious) sedation. Sedation was managed by the Endoscopist. Fentanyl 100 mcg. given IV. Versed 10 mg. given IV.  Comments: Most of prep good to excellent. Minor limitation in cecum due to a thin coating of adherent prep material. Findings - NORMAL EXAM: Cecum to Descending Colon.  DIVERTICULOSIS: Sigmoid Colon. Not bleeding. ICD9: Diverticulosis, Colon: 562.10. Comments: Severe.  HEMORRHOIDS: External. Size: Grade I. Not bleeding. Not thrombosed. ICD9: Hemorrhoids, External: 455.3.   Assessment Abnormal examination, see findings above.  Diagnoses: 562.10:  Diverticulosis, Colon.  455.3: Hemorrhoids, External.   Comments: No polyps or cancer seen. Events  Unplanned Interventions: No intervention was required.  Plans Patient Education: Patient given standard instructions for: Diverticulosis. Hemorrhoids.  Disposition: After procedure patient sent to recovery. After recovery patient sent home.  Scheduling/Referral: Primary Care Provider, to [facility] A. Tawanna Cooler, MD, as planned and for future colon cancer screening,   Comments: In the future would consider deep sedation for colonoscopy or other screening methods (e.g. 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  CC:    Kelle Darting, MD  This report was created from the original endoscopy report, which was reviewed and signed by the above listed endoscopist.

## 2010-10-12 NOTE — Assessment & Plan Note (Signed)
Summary: pain in lft side/cjr   Vital Signs:  Patient profile:   60 year old female Temp:     99.0 degrees F oral BP sitting:   130 / 80  (left arm) Cuff size:   large  Vitals Entered By: Sid Falcon LPN (November 09, 452 2:58 PM) CC: pain in left side 2 weeks   History of Present Illness: Acute visit. Patient seen with left flank and side pain progressive over the past 2 weeks and possibly longer. Pain is described as sharp and relatively constant. Better lying flat and worse when she lies on her left side. Also worse with sitting. Pain radiates somewhat anteriorly and inferiorly. She's had more frequent stools recently but no diarrhea. Denies constipation. Denies any cough, pleuritic pain, dyspnea, urinary symptoms, fever, or chills.  Patient's had prior colonoscopy. History of diverticulitis with reported ruptured diverticulum about 3 years ago requiring lengthy hospitalization. She is allergic to Cleocin.  Pt has had some chronic hematuria on urine dipstick but no gross hematuria.  No hx kidney stones.  No urologic W/U thus far.  Allergies: 1)  ! Cleocin  Past History:  Past Medical History: Last updated: 08/18/2009 Diverticulitis, hx of  Allergic rhinitis asymptomatic hematuria Hypertension  Past Surgical History: Last updated: 08/18/2009 Hysterectomy, ovaries ok 1998 Tonsillectomy, adenoidectomy back pain 2004 CB X1 segmental colon resection for diverticulitis with Hartmann's procedure Colostomy reversal  Social History: Last updated: 08/18/2009 single Never Smoked Alcohol use-no  rarely Drug use-no Regular exercise-no 1 child PMH-FH-SH reviewed for relevance  Review of Systems  The patient denies anorexia, fever, weight loss, chest pain, syncope, dyspnea on exertion, peripheral edema, prolonged cough, headaches, hemoptysis, melena, hematochezia, severe indigestion/heartburn, and hematuria.    Physical Exam  General:  Well-developed,well-nourished,in no  acute distress; alert,appropriate and cooperative throughout examination Mouth:  Oral mucosa and oropharynx without lesions or exudates.  Teeth in good repair. Neck:  No deformities, masses, or tenderness noted. Lungs:  Normal respiratory effort, chest expands symmetrically. Lungs are clear to auscultation, no crackles or wheezes. Heart:  Normal rate and regular rhythm. S1 and S2 normal without gallop, murmur, click, rub or other extra sounds. Abdomen:  normal bowel sounds. Nondistended. Soft with mild to moderate tenderness left mid quadrant. No guarding or rebound. No masses palpated. No splenomegaly or hepatomegaly. Msk:  No CVA tenderness.   Impression & Recommendations:  Problem # 1:  ABDOMINAL PAIN, LEFT LOWER QUADRANT (ICD-789.04) no evidence for acute abdomen. Obtain CBC and urine dipstick. Cover with antibiotics given history of diverticulitis. Consider CT abdomen and pelvis if not improving over the next couple of days Orders: UA Dipstick w/o Micro (manual) (09811) Venipuncture (91478) TLB-CBC Platelet - w/Differential (85025-CBCD)  Problem # 2:  HEMATURIA UNSPECIFIED (ICD-599.70)  needs further urologic w/u. Her updated medication list for this problem includes:    Ciprofloxacin Hcl 500 Mg Tabs (Ciprofloxacin hcl) ..... One by mouth two times a day for 10 days    Metronidazole 500 Mg Tabs (Metronidazole) ..... One by mouth bid for 10 days  Orders: Urology Referral (Urology)  Complete Medication List: 1)  Flonase 50 Mcg/act Susp (Fluticasone propionate) .... One shot r&l nostril at bedtime 2)  Flexeril 10 Mg Tabs (Cyclobenzaprine hcl) .Marland Kitchen.. 1 tab @ bedtime 3)  Vicodin Es 7.5-750 Mg Tabs (Hydrocodone-acetaminophen) .Marland Kitchen.. 1 tab @ bedtime 4)  Ciprofloxacin Hcl 500 Mg Tabs (Ciprofloxacin hcl) .... One by mouth two times a day for 10 days 5)  Metronidazole 500 Mg Tabs (Metronidazole) .Marland KitchenMarland KitchenMarland Kitchen  One by mouth bid for 10 days  Patient Instructions: 1)  Followup immediately if you  develop any fever or worsening abdominal symptoms. 2)  Touch base in 2 days if symptoms not improving Prescriptions: METRONIDAZOLE 500 MG TABS (METRONIDAZOLE) one by mouth bid for 10 days  #20 x 0   Entered and Authorized by:   Evelena Peat MD   Signed by:   Evelena Peat MD on 11/09/2009   Method used:   Electronically to        [facility] [address] (retail)       [phone]       [place], [place]  [address]       Ph: [phone]       Fax: [phone]   RxID:   [id number] CIPROFLOXACIN HCL 500 MG TABS (CIPROFLOXACIN HCL) one by mouth two times a day for 10 days  #20 x 0   Entered and Authorized by:   Evelena Peat MD   Signed by:   Evelena Peat MD on 11/09/2009   Method used:   Electronically to        [facility] [address] (retail)       [phone]       [place], [place]  [address]       Ph: [phone]       Fax: [phone]   RxID:   [id number]   Pt called, she has called her pharmacy 2 times and they told her they had not received the Rx from Dr Caryl Never yet.  Both Rx were called in, left on pharmacy VM, pt informed. Sid Falcon LPN  November 10, 3014 4:38 PM  Laboratory Results   Urine Tests    Routine Urinalysis   Color: yellow Appearance: Clear Glucose: negative   (Normal Range: Negative) Bilirubin: negative   (Normal Range: Negative) Ketone: negative   (Normal Range: Negative) Spec. Gravity: 1.020   (Normal Range: 1.003-1.035) Blood: large   (Normal Range: Negative) pH: 7.0   (Normal Range: 5.0-8.0) Protein: negative   (Normal Range: Negative) Urobilinogen: 0.2   (Normal Range: 0-1) Nitrite: negative   (Normal Range: Negative) Leukocyte Esterace: negative   (Normal Range: Negative)    Comments: Sid Falcon LPN  November 10, 107 3:52 PM

## 2010-10-12 NOTE — Assessment & Plan Note (Signed)
Summary: cpx/jls   Vital Signs:  Patient Profile:   60 Years Old Female Height:     66 inches (167.64 cm) Weight:      255 pounds Temp:     98.7 degrees F oral Pulse rate:   68 / minute Pulse rhythm:   regular BP sitting:   140 / 80  (left arm) Cuff size:   regular  Vitals Entered By: Kern Reap CMA (June 25, 2008 3:51 PM)                 Chief Complaint:  cpx.  History of Present Illness: Summer [name] is a 60 year old female, who comes in today for physical examination  She's had a history of trigeminal neuralgia currently asymptomatic except she states she occasionally has a sharp pain in her head that only lasts a second tumor is white.  She was on Tenoretic 5025 daily for hypertension.  BP now 140/80, off medication.  She is up from our health maintenance.  Activities.  Last tetanus shot was 2000.  We will give her a flu shot today    Current Allergies (reviewed today): ! CLEOCIN  Past Medical History:    Reviewed history from 06/13/2007 and no changes required:       Diabetes mellitus, type II       Hypertension       Diverticulitis, hx of ruptured diverticulum 2007 colostomy with minowreversal   Family History:    Reviewed history and no changes required:  Social History:    Reviewed history and no changes required:       Occupation:       Married       Never Smoked       Alcohol use-no       Drug use-no       Regular exercise-no   Risk Factors:  Tobacco use:  never Drug use:  no Alcohol use:  no Exercise:  no   Review of Systems      See HPI   Physical Exam  General:     Well-developed,well-nourished,in no acute distress; alert,appropriate and cooperative throughout examination Head:     Normocephalic and atraumatic without obvious abnormalities. No apparent alopecia or balding. Eyes:     No corneal or conjunctival inflammation noted. EOMI. Perrla. Funduscopic exam benign, without hemorrhages, exudates or papilledema. Vision  grossly normal. Ears:     External ear exam shows no significant lesions or deformities.  Otoscopic examination reveals clear canals, tympanic membranes are intact bilaterally without bulging, retraction, inflammation or discharge. Hearing is grossly normal bilaterally. Nose:     External nasal examination shows no deformity or inflammation. Nasal mucosa are pink and moist without lesions or exudates. Mouth:     Oral mucosa and oropharynx without lesions or exudates.  Teeth in good repair. Neck:     No deformities, masses, or tenderness noted. Chest Wall:     No deformities, masses, or tenderness noted. Breasts:     No mass, nodules, thickening, tenderness, bulging, retraction, inflamation, nipple discharge or skin changes noted.   Lungs:     Normal respiratory effort, chest expands symmetrically. Lungs are clear to auscultation, no crackles or wheezes. Heart:     Normal rate and regular rhythm. S1 and S2 normal without gallop, murmur, click, rub or other extra sounds. Abdomen:     Bowel sounds positive,abdomen soft and non-tender without masses, organomegaly or hernias noted. Msk:     No deformity or scoliosis noted of thoracic or  lumbar spine.   Pulses:     R and L carotid,radial,femoral,dorsalis pedis and posterior tibial pulses are full and equal bilaterally Extremities:     No clubbing, cyanosis, edema, or deformity noted with normal full range of motion of all joints.   Neurologic:     No cranial nerve deficits noted. Station and gait are normal. Plantar reflexes are down-going bilaterally. DTRs are symmetrical throughout. Sensory, motor and coordinative functions appear intact. Skin:     Intact without suspicious lesions or rashes Cervical Nodes:     No lymphadenopathy noted Axillary Nodes:     No palpable lymphadenopathy Inguinal Nodes:     No significant adenopathy Psych:     Cognition and judgment appear intact. Alert and cooperative with normal attention span and  concentration. No apparent delusions, illusions, hallucinations    Impression & Recommendations:  Problem # 1:  PHYSICAL EXAMINATION (ICD-V70.0) Assessment: Comment Only  Problem # 2:  HEMATURIA UNSPECIFIED (ICD-599.70) Assessment: New  Orders: UA Dipstick w/o Micro (manual) (19147)  Her updated medication list for this problem includes:    Septra Ds 800-160 Mg Tabs (Sulfamethoxazole-trimethoprim) .Marland Kitchen... Take 1 tablet by mouth two times a day   Complete Medication List: 1)  Septra Ds 800-160 Mg Tabs (Sulfamethoxazole-trimethoprim) .... Take 1 tablet by mouth two times a day  Other Orders: EKG w/ Interpretation (93000)   Patient Instructions: 1)  Please schedule a follow-up appointment in 1 year. 2)  It is important that you exercise regularly at least 20 minutes 5 times a week. If you develop chest pain, have severe difficulty breathing, or feel very tired , stop exercising immediately and seek medical attention. 3)  You need to lose weight. Consider a lower calorie diet and regular exercise.  4)  Take an Aspirin every day.   Prescriptions: SEPTRA DS 800-160 MG TABS (SULFAMETHOXAZOLE-TRIMETHOPRIM) Take 1 tablet by mouth two times a day  #40 x 0   Entered and Authorized by:   Roderick Pee MD   Signed by:   Roderick Pee MD on 06/25/2008   Method used:   [facility]        [facility]  [address] [phone]* (retail)       [address]       [place], [place]  [address]       Ph: [phone] or [phone]       Fax: [phone]   RxID:   [id number]  ]   Laboratory Results   Urine Tests    Routine Urinalysis   Color: yellow Appearance: Hazy Glucose: negative   (Normal Range: Negative) Bilirubin: negative   (Normal Range: Negative) Ketone: negative   (Normal Range: Negative) Spec. Gravity: 1.020   (Normal Range: 1.003-1.035) Blood: moderate   (Normal Range: Negative) pH: 6.0   (Normal Range: 5.0-8.0) Protein:  trace   (Normal Range: Negative) Urobilinogen: 0.2   (Normal Range: 0-1) Nitrite: negative   (Normal Range: Negative) Leukocyte Esterace: negative   (Normal Range: Negative)

## 2010-10-12 NOTE — Assessment & Plan Note (Signed)
Summary: left side pain near bladder//ccm   Vital Signs:  Patient profile:   60 year old female Weight:      251 pounds Temp:     98.4 degrees F oral BP sitting:   140 / 80  (left arm) Cuff size:   regular  Vitals Entered By: Kern Reap CMA Duncan Dull) (Jan 16, 2010 9:08 AM) CC: left sided pain, dysuria, pressure, burning   Primary Care Provider:  [name],MD  CC:  left sided pain, dysuria, pressure, and burning.  History of Present Illness: Summer [name]  is a 61 year old, married female, nonsmokercomes in today for evaluation of a urinary tract condition.  We had seen her here awhile back with flank pain.  She ended up being hospitalized for 4 days with an obstructive uropathy.  The urologist..........Marland Kitchen Dr. Wynelle Link......... felt like it was a kidney stone.  She was scheduled for lithotripsy however, when they started the lithotripsy the stone was not visible.  They therefore canceled.  The procedure.  She was sent to GYN.  GYN evaluation was negative and the GYN center back to see urologist.  The urologist at cystoscopy, which was normal.  She still having pain nonvisible blood in her urine......... by her dipstick.......... and wants to know what to do.  She has an appointment to see the urologist today at 215.  However, she states the urology office called her and told her it would be a waste of her time to come and see Dr. Wynelle Link .  In reviewing her history it appears that her GYN status is stable.  However, she still has flank pain, and hematuria.  I advised her to keep the appointment with the urologist today and if he does not feel like he could help her then the next step would be to have him refer her to [facility]  Allergies: 1)  ! Cleocin  Past History:  Past medical, surgical, family and social histories (including risk factors) reviewed for relevance to current acute and chronic problems.  Past Medical History: Reviewed history from 08/18/2009 and no changes  required. Diverticulitis, hx of  Allergic rhinitis asymptomatic hematuria Hypertension  Past Surgical History: Reviewed history from 08/18/2009 and no changes required. Hysterectomy, ovaries ok 1998 Tonsillectomy, adenoidectomy back pain 2004 CB X1 segmental colon resection for diverticulitis with Hartmann's procedure Colostomy reversal  Family History: Reviewed history from 08/18/2009 and no changes required. No FH of Colon Cancer:  Social History: Reviewed history from 08/18/2009 and no changes required. single Never Smoked Alcohol use-no  rarely Drug use-no Regular exercise-no 1 child  Review of Systems      See HPI  Physical Exam  General:  Well-developed,well-nourished,in no acute distress; alert,appropriate and cooperative throughout examination   Impression & Recommendations:  Problem # 1:  HEMATURIA, HX OF (ICD-V13.09) Assessment Unchanged  Complete Medication List: 1)  Flonase 50 Mcg/act Susp (Fluticasone propionate) .... One shot r&l nostril at bedtime 2)  Flexeril 10 Mg Tabs (Cyclobenzaprine hcl) .Marland Kitchen.. 1 tab @ bedtime 3)  Vicodin Es 7.5-750 Mg Tabs (Hydrocodone-acetaminophen) .Marland Kitchen.. 1 tab @ bedtime 4)  Ciprofloxacin Hcl 500 Mg Tabs (Ciprofloxacin hcl) .... One by mouth two times a day for 10 days 5)  Metronidazole 500 Mg Tabs (Metronidazole) .... One by mouth bid for 10 days  Other Orders: UA Dipstick w/o Micro (automated)  ([id number])  Patient Instructions: 1)  keep the appointment to see you urologist today  Laboratory Results   Urine Tests  Date/Time Recieved: Jan 16, 2010 9:01  AM  Date/Time Reported: Jan 16, 2010 9:01 AM   Routine Urinalysis   Color: yellow Appearance: Clear Glucose: negative   (Normal Range: Negative) Bilirubin: negative   (Normal Range: Negative) Ketone: negative   (Normal Range: Negative) Spec. Gravity: 1.020   (Normal Range: 1.003-1.035) Blood: 1+   (Normal Range: Negative) pH: 7.0   (Normal Range: 5.0-8.0) Protein:  negative   (Normal Range: Negative) Urobilinogen: 0.2   (Normal Range: 0-1) Nitrite: negative   (Normal Range: Negative) Leukocyte Esterace: trace   (Normal Range: Negative)    Comments: Summer [name], CMA  Jan 16, 2010 9:01 AM      Laboratory Results   Urine Tests    Routine Urinalysis   Color: yellow Appearance: Clear Glucose: negative   (Normal Range: Negative) Bilirubin: negative   (Normal Range: Negative) Ketone: negative   (Normal Range: Negative) Spec. Gravity: 1.020   (Normal Range: 1.003-1.035) Blood: 1+   (Normal Range: Negative) pH: 7.0   (Normal Range: 5.0-8.0) Protein: negative   (Normal Range: Negative) Urobilinogen: 0.2   (Normal Range: 0-1) Nitrite: negative   (Normal Range: Negative) Leukocyte Esterace: trace   (Normal Range: Negative)    Comments: Summer [name], CMA  Jan 16, 2010 9:01 AM

## 2010-10-12 NOTE — Progress Notes (Signed)
Summary: Call-A-Nurse Report    Call-A-Nurse Triage Call Report Triage Record Num: [id number] Operator: Di Kindle Patient Name: Summer [name] Call Date & Time: 11/13/2009 3:29:03PM Patient Phone: [phone] PCP: Eugenio Hoes. Callan Yontz Patient Gender: Female PCP Fax : [phone] Patient DOB: 16-Feb-1951 Practice Name: [facility] Reason for Call: Pt calling: bleeding with urine, onset 11/12/09, has seen over a year with pain to left side, back and side, now pain is throbbing, afebrile. Guideline: bloody urine, advised need to be seen in ED: [facility]. Protocol(s) Used: Bloody Urine Recommended Outcome per Protocol: See ED Immediately Reason for Outcome: Unbearable flank, low back or lower abdominal pain Care Advice:  ~ Allow the patient to be in a position of comfort.  ~ Another adult should drive. 11/13/2009 3:37:22PM Page 1 of 1 [id number]

## 2010-10-12 NOTE — Assessment & Plan Note (Signed)
Summary: consult before col wants to discuss proc w dr..em    History of Present Illness Visit Type: follow up Primary GI MD: Stan Head MD [facility] Primary Provider: [name],MD Requesting Provider: n/a Chief Complaint: colon cancer screening History of Present Illness:   This is a 60 year old African American woman who presents to discuss referral for colonoscopy. She had undergone a colonoscopy in December 2004 that showed diverticulosis and external hemorrhoids. She had indicated a history of colon polyps in the past but this is not correct. She is asymptomatic at this time.    GI Review of Systems    Reports abdominal pain, chest pain, and  weight gain.      Denies acid reflux, belching, bloating, dysphagia with liquids, dysphagia with solids, heartburn, loss of appetite, nausea, vomiting, vomiting blood, and  weight loss.      Reports change in bowel habits, constipation, diverticulosis, and  fecal incontinence.     Denies anal fissure, black tarry stools, diarrhea, heme positive stool, hemorrhoids, irritable bowel syndrome, jaundice, light color stool, liver problems, rectal bleeding, and  rectal pain.    Current Medications (verified): 1)  Flonase 50 Mcg/act Susp (Fluticasone Propionate) .... One Shot R&l Nostril At Bedtime 2)  Flexeril 10 Mg Tabs (Cyclobenzaprine Hcl) .Marland Kitchen.. 1 Tab @ Bedtime 3)  Vicodin Es 7.5-750 Mg Tabs (Hydrocodone-Acetaminophen) .Marland Kitchen.. 1 Tab @ Bedtime 4)  Antivert 25 Mg Tabs (Meclizine Hcl) .... One By Mouth Q 6 Hours As Needed Nausea and Vertigo 5)  Transderm-Scop 1.5 Mg Pt72 (Scopolamine Base) .Marland Kitchen.. 1 Q 3 Days  Allergies (verified): 1)  ! Cleocin  Past History:  Past Medical History: Diverticulitis, hx of  Allergic rhinitis asymptomatic hematuria Hypertension  Past Surgical History: Hysterectomy, ovaries ok 1998 Tonsillectomy, adenoidectomy back pain 2004 CB X1 segmental colon resection for diverticulitis with Hartmann's procedure Colostomy  reversal Prep and  Family History: No FH of Colon Cancer:  Social History: single Never Smoked Alcohol use-no  rarely Drug use-no Regular exercise-no 1 child  Vital Signs:  Patient profile:   60 year old female Height:      66 inches Weight:      262 pounds BMI:     42.44 BSA:     2.25 Pulse rate:   82 / minute Pulse rhythm:   regular BP sitting:   118 / 72  (left arm)  Vitals Entered By: Merri Ray CMA (AAMA) (August 18, 2009 9:11 AM)  Physical Exam  General:  morbidly obese, NAD   Impression & Recommendations:  Problem # 1:  SCREENING COLORECTAL-CANCER (ICD-V76.51) Assessment Comment Only She is up to date and not symptomatic and no family hx changes. she erroneously thought she had polyps in past we gave her a copy of 2004 colonoscpy report for recordshim  I explained current guidelines for colon cancer screening. She is average risk and a routine colonoscopy is currently recommended every 10 years. In the absence of any particular problems at this time she will wait for her routine colonoscopy to occur in December 2014, return sooner if signs or symptoms warrant. There was no charge for this visit.  Problem # 2:  DIVERTICULITIS, HX OF (ICD-V12.79) Assessment: Comment Only s/p perforation with hartmann's procedure and colostomy reversal  Patient Instructions: 1)  You are due for a repeat colonoscopy in December 2014. 2)  A copy of your last colonoscopy report is attached. 3)  Please schedule a follow-up appointment as needed. 4)  The medication list was reviewed and reconciled.  All changed / newly prescribed medications were explained.  A complete medication list was provided to the patient / caregiver.

## 2010-10-12 NOTE — Assessment & Plan Note (Signed)
Summary: DIZZINESS // RS---PT [facility] // RS   Vital Signs:  Patient profile:   60 year old female Weight:      255 pounds Temp:     98.4 degrees F oral BP sitting:   130 / 74  (left arm) Cuff size:   regular  Vitals Entered By: Kern Reap CMA Duncan Dull) (March 20, 2010 3:25 PM) CC: light headed, blurr vision Is Patient Diabetic? No   Primary Care Provider:  [name],MD  CC:  light headed and blurr vision.  History of Present Illness: Summer [name] is a 60 year old, married female, nonsmoker, who comes in today for evaluation of episodes of blurred vision.  She states for the past couple, much she's had episodes, which she described as some blurred vision, and last for a minute or two or 3 and then goes away.  It happens most weak maybe two or 3 times a month.  She gets a headache with these episodes.  Review of systems negative except she has had a history of migraine headaches in the past.  However, in the past, when she gets a migraine she would have a bad headache.  With these episodes, showing as visual changes, and no headache  Allergies: 1)  ! Cleocin  Past History:  Past medical, surgical, family and social histories (including risk factors) reviewed, and no changes noted (except as noted below).  Past Medical History: Reviewed history from 08/18/2009 and no changes required. Diverticulitis, hx of  Allergic rhinitis asymptomatic hematuria Hypertension  Past Surgical History: Hysterectomy, ovaries ok 1998 Tonsillectomy, adenoidectomy back pain 2004 CB X1 segmental colon resection for diverticulitis with Hartmann's procedure Colostomy reversal  trigeminal neuralgia........... surgical relief  Family History: Reviewed history from 08/18/2009 and no changes required. No FH of Colon Cancer:  Social History: Reviewed history from 08/18/2009 and no changes required. single Never Smoked Alcohol use-no  rarely Drug use-no Regular exercise-no 1 child  Review of  Systems      See HPI  Physical Exam  General:  Well-developed,well-nourished,in no acute distress; alert,appropriate and cooperative throughout examination Head:  Normocephalic and atraumatic without obvious abnormalities. No apparent alopecia or balding. Eyes:  No corneal or conjunctival inflammation noted. EOMI. Perrla. Funduscopic exam benign, without hemorrhages, exudates or papilledema. Vision grossly normal. Ears:  External ear exam shows no significant lesions or deformities.  Otoscopic examination reveals clear canals, tympanic membranes are intact bilaterally without bulging, retraction, inflammation or discharge. Hearing is grossly normal bilaterally. Nose:  External nasal examination shows no deformity or inflammation. Nasal mucosa are pink and moist without lesions or exudates. Mouth:  Oral mucosa and oropharynx without lesions or exudates.  Teeth in good repair. Neck:  No deformities, masses, or tenderness noted. Chest Wall:  No deformities, masses, or tenderness noted. Lungs:  Normal respiratory effort, chest expands symmetrically. Lungs are clear to auscultation, no crackles or wheezes. Heart:  Normal rate and regular rhythm. S1 and S2 normal without gallop, murmur, click, rub or other extra sounds. Pulses:  R and L carotid,radial,femoral,dorsalis pedis and posterior tibial pulses are full and equal bilaterally...no carotid bruits   Impression & Recommendations:  Problem # 1:  BLURRED VISION (ICD-368.8) Assessment New  Complete Medication List: 1)  Flonase 50 Mcg/act Susp (Fluticasone propionate) .... One shot r&l nostril at bedtime 2)  Flexeril 10 Mg Tabs (Cyclobenzaprine hcl) .Marland Kitchen.. 1 tab @ bedtime 3)  Vicodin Es 7.5-750 Mg Tabs (Hydrocodone-acetaminophen) .Marland Kitchen.. 1 tab @ bedtime  Patient Instructions: 1)  the physical examination is normal.  The history is consistent with ophthalmic migraine. 2)  See her ophthalmologist at soon for an eye exam.

## 2010-10-12 NOTE — Assessment & Plan Note (Signed)
Summary: SHOULDER/KNEE PAIN/NJR  Medications Added SINGULAIR 10 MG TABS (MONTELUKAST SODIUM) Take 1 tablet by mouth once a day                 Complete Medication List: 1)  Singulair 10 Mg Tabs (Montelukast sodium) .... Take 1 tablet by mouth once a day

## 2010-10-30 ENCOUNTER — Telehealth: Payer: Self-pay | Admitting: Family Medicine

## 2010-10-30 NOTE — Telephone Encounter (Signed)
She does not need who she can call and make her own appointment with Dr. Dulcy Fanny

## 2010-10-30 NOTE — Telephone Encounter (Signed)
Pt needs referral to Dr. Stacey Drain (rheumatologist) at [phone].  Was previously referred to Dr. Thayer Jew however they will not accept pt insurance.

## 2010-11-01 NOTE — Telephone Encounter (Signed)
Pt called herself to make appt with Dr. Dulcy Fanny and was not allowed to she was informed that she would need to be referred by her pcp.  Please submit order for referral.

## 2010-11-02 ENCOUNTER — Other Ambulatory Visit: Payer: PRIVATE HEALTH INSURANCE | Admitting: Family Medicine

## 2010-11-02 DIAGNOSIS — Z0000 Encounter for general adult medical examination without abnormal findings: Secondary | ICD-10-CM

## 2010-11-02 DIAGNOSIS — E785 Hyperlipidemia, unspecified: Secondary | ICD-10-CM

## 2010-11-02 LAB — CBC WITH DIFFERENTIAL/PLATELET
Basophils Absolute: 0 10*3/uL (ref 0.0–0.1)
Basophils Relative: 0.2 % (ref 0.0–3.0)
Eosinophils Absolute: 0.3 10*3/uL (ref 0.0–0.7)
Eosinophils Relative: 4.1 % (ref 0.0–5.0)
HCT: 39.6 % (ref 36.0–46.0)
Hemoglobin: 13.1 g/dL (ref 12.0–15.0)
Lymphocytes Relative: 32 % (ref 12.0–46.0)
Lymphs Abs: 2.4 10*3/uL (ref 0.7–4.0)
MCHC: 33.1 g/dL (ref 30.0–36.0)
MCV: 90.1 fl (ref 78.0–100.0)
Monocytes Absolute: 0.5 10*3/uL (ref 0.1–1.0)
Monocytes Relative: 6.6 % (ref 3.0–12.0)
Neutro Abs: 4.2 10*3/uL (ref 1.4–7.7)
Neutrophils Relative %: 57.1 % (ref 43.0–77.0)
Platelets: 302 10*3/uL (ref 150.0–400.0)
RBC: 4.39 Mil/uL (ref 3.87–5.11)
RDW: 13.9 % (ref 11.5–14.6)
WBC: 7.4 10*3/uL (ref 4.5–10.5)

## 2010-11-02 LAB — POCT URINALYSIS DIPSTICK
Bilirubin, UA: NEGATIVE
Glucose, UA: NEGATIVE
Leukocytes, UA: NEGATIVE
Nitrite, UA: NEGATIVE
Protein, UA: NEGATIVE
Spec Grav, UA: 1.025
Urobilinogen, UA: 0.2
pH, UA: 6

## 2010-11-02 LAB — LIPID PANEL
Cholesterol: 216 mg/dL — ABNORMAL HIGH (ref 0–200)
HDL: 55.4 mg/dL (ref >39.00)
Total CHOL/HDL Ratio: 4
Triglycerides: 120 mg/dL (ref 0.0–149.0)
VLDL: 24 mg/dL (ref 0.0–40.0)

## 2010-11-02 LAB — HEPATIC FUNCTION PANEL
ALT: 17 U/L (ref 0–35)
AST: 18 U/L (ref 0–37)
Albumin: 3.5 g/dL (ref 3.5–5.2)
Alkaline Phosphatase: 87 U/L (ref 39–117)
Bilirubin, Direct: 0.1 mg/dL (ref 0.0–0.3)
Total Bilirubin: 0.4 mg/dL (ref 0.3–1.2)
Total Protein: 6.4 g/dL (ref 6.0–8.3)

## 2010-11-02 LAB — BASIC METABOLIC PANEL
BUN: 12 mg/dL (ref 6–23)
CO2: 31 mEq/L (ref 19–32)
Calcium: 8.9 mg/dL (ref 8.4–10.5)
Chloride: 104 mEq/L (ref 96–112)
Creatinine, Ser: 0.8 mg/dL (ref 0.4–1.2)
GFR: 97.7 mL/min (ref >60.00)
Glucose, Bld: 106 mg/dL — ABNORMAL HIGH (ref 70–99)
Potassium: 4.4 mEq/L (ref 3.5–5.1)
Sodium: 141 mEq/L (ref 135–145)

## 2010-11-02 LAB — LDL CHOLESTEROL, DIRECT: Direct LDL: 140.5 mg/dL

## 2010-11-02 LAB — TSH: TSH: 1.09 u[IU]/mL (ref 0.35–5.50)

## 2010-11-03 ENCOUNTER — Telehealth: Payer: Self-pay | Admitting: *Deleted

## 2010-11-03 DIAGNOSIS — M1990 Unspecified osteoarthritis, unspecified site: Secondary | ICD-10-CM

## 2010-11-03 NOTE — Telephone Encounter (Signed)
patient  Will need a referral to make an appointment

## 2010-11-08 ENCOUNTER — Encounter: Payer: Self-pay | Admitting: Family Medicine

## 2010-11-09 ENCOUNTER — Ambulatory Visit (INDEPENDENT_AMBULATORY_CARE_PROVIDER_SITE_OTHER): Payer: PRIVATE HEALTH INSURANCE | Admitting: Family Medicine

## 2010-11-09 ENCOUNTER — Encounter: Payer: Self-pay | Admitting: Family Medicine

## 2010-11-09 DIAGNOSIS — M797 Fibromyalgia: Secondary | ICD-10-CM | POA: Insufficient documentation

## 2010-11-09 DIAGNOSIS — J309 Allergic rhinitis, unspecified: Secondary | ICD-10-CM

## 2010-11-09 DIAGNOSIS — IMO0001 Reserved for inherently not codable concepts without codable children: Secondary | ICD-10-CM

## 2010-11-09 DIAGNOSIS — Z0000 Encounter for general adult medical examination without abnormal findings: Secondary | ICD-10-CM

## 2010-11-09 DIAGNOSIS — E669 Obesity, unspecified: Secondary | ICD-10-CM

## 2010-11-09 HISTORY — DX: Morbid (severe) obesity due to excess calories: E66.01

## 2010-11-09 MED ORDER — FLUTICASONE PROPIONATE 50 MCG/ACT NA SUSP: 2.0000 | Freq: Every day | NASAL | Status: DC

## 2010-11-09 NOTE — Progress Notes (Signed)
  Subjective:    Patient ID: Summer [name], female    DOB: 08/19/1951, 60 y.o.   MRN: [id number]  HPI Summer [name] Is a 60 year old, married female, nonsmoker, who comes in today for general physical examination  She has numerous underlying medical problems.  She has a history of fibromyalgia currently treated by Dr. Algis Downs. The rheumatologist.  They have her on Cymbalta 60 mg daily, Ambien 10 nightly methocarbamol 500 t.i.d., Colace.  She has a history of trigeminal neuralgia.  She saw the neurologist, Dr. Porfirio Mylar D. About 6 years ago.  She subsequent had surgery at [facility].  Now she's having residual pain in her face will refer back to Dr. Percell Miller..  She also complains of left shoulder pain for over a year.  Refer back to the rheumatologist.  She also complains of leg cramps x 6 months.  Again, referred this to neurology.  She gets routine eye care, dental care, BSE monthly, and he mammography, colonoscopy.  She gives him GI.  She's had a partial colectomy for severe diverticulitis, which required a colostomy.  It was reversed.  Bowel movements normal.  She also had a hysterectomy for fibroids 1998, tonsillectomy, chronic low back pain, childbirth x 1.   Review of Systems  Constitutional: Negative.   HENT: Negative.   Eyes: Negative.   Respiratory: Negative.   Cardiovascular: Negative.   Gastrointestinal: Negative.   Genitourinary: Negative.   Musculoskeletal: Negative.   Neurological: Negative.   Hematological: Negative.   Psychiatric/Behavioral: Negative.        Objective:   Physical Exam  Constitutional: She appears well-developed and well-nourished.  HENT:  Head: Normocephalic and atraumatic.  Right Ear: External ear normal.  Left Ear: External ear normal.  Nose: Nose normal.  Mouth/Throat: Oropharynx is clear and moist.  Eyes: EOM are normal. Pupils are equal, round, and reactive to light.  Neck: Normal range of motion. Neck supple. No thyromegaly present.    Cardiovascular: Normal rate, regular rhythm, normal heart sounds and intact distal pulses.  Exam reveals no gallop and no friction rub.   No murmur heard. Pulmonary/Chest: Effort normal and breath sounds normal.  Abdominal: Soft. Bowel sounds are normal. She exhibits no distension and no mass. There is no tenderness. There is no rebound.  Genitourinary: Vagina normal and uterus normal. Guaiac negative stool. No vaginal discharge found.  Musculoskeletal: Normal range of motion.  Lymphadenopathy:    She has no cervical adenopathy.  Neurological: She is alert. She has normal reflexes. No cranial nerve deficit. She exhibits normal muscle tone. Coordination normal.  Skin: Skin is warm and dry.  Psychiatric: She has a normal mood and affect. Her behavior is normal. Judgment and thought content normal.          Assessment & Plan:  Fibromyalgia continue follow-up by rheumatology.  History of trigeminal neuralgia, now with recurrent pain and refer back to neurology.  Restless leg syndrome.  Symptoms x 6 months refer to neurology.  Obesity.  Weight 254, with mild elevation of blood sugar 106,,,,,,,,,,,,, recommend diet, exercise and weight loss program

## 2010-11-09 NOTE — Patient Instructions (Signed)
Called the neurology office and set up an appointment to see a neurologist for evaluation of the facial pain in the restless leg syndrome.  Return in one year for general medical exam.  Begin a diet and exercise program by decreasing y  caloric intake to 2000 calories daily, walk 30 minutes daily, no salt

## 2010-11-17 ENCOUNTER — Telehealth: Payer: Self-pay | Admitting: Family Medicine

## 2010-11-17 DIAGNOSIS — M1990 Unspecified osteoarthritis, unspecified site: Secondary | ICD-10-CM

## 2010-11-17 NOTE — Telephone Encounter (Signed)
Pt called to request a referral to Dr. Vickey Huger w/ [facility].. (has been seen in past by Dr Vickey Huger but it has been over 3 years, so they want another referral).. Pt didn't want to come in for appt to discuss same, just adv that she wants a referral so she can make an appt with Dr. Vickey Huger.... Pt can be reached at [phone].

## 2010-11-18 NOTE — Telephone Encounter (Signed)
Okay .......Marland Kitchen referred to neurology

## 2010-11-23 ENCOUNTER — Telehealth: Payer: Self-pay | Admitting: Family Medicine

## 2010-11-23 NOTE — Telephone Encounter (Addendum)
Pt called to check on status for referral to Neurologist. Pt req to see Dr Vickey Huger. Pls do referral asap.  Pt is req that she be called back asap re: this matter.

## 2010-11-24 NOTE — Telephone Encounter (Signed)
Referral started  

## 2010-11-28 LAB — URINE MICROSCOPIC-ADD ON

## 2010-11-28 LAB — BASIC METABOLIC PANEL
BUN: 8 mg/dL (ref 6–23)
CO2: 26 mEq/L (ref 19–32)
Calcium: 8.9 mg/dL (ref 8.4–10.5)
Chloride: 106 mEq/L (ref 96–112)
Creatinine, Ser: 1.24 mg/dL — ABNORMAL HIGH (ref 0.4–1.2)
GFR calc Af Amer: 54 mL/min — ABNORMAL LOW (ref >60)
GFR calc non Af Amer: 45 mL/min — ABNORMAL LOW (ref >60)
Glucose, Bld: 131 mg/dL — ABNORMAL HIGH (ref 70–99)
Potassium: 3.5 mEq/L (ref 3.5–5.1)
Sodium: 141 mEq/L (ref 135–145)

## 2010-11-28 LAB — URINE CULTURE
Colony Count: NO GROWTH
Culture: NO GROWTH

## 2010-11-28 LAB — DIFFERENTIAL
Basophils Absolute: 0 10*3/uL (ref 0.0–0.1)
Basophils Relative: 1 % (ref 0–1)
Eosinophils Absolute: 0.2 10*3/uL (ref 0.0–0.7)
Eosinophils Relative: 3 % (ref 0–5)
Lymphocytes Relative: 32 % (ref 12–46)
Lymphs Abs: 2.4 10*3/uL (ref 0.7–4.0)
Monocytes Absolute: 0.6 10*3/uL (ref 0.1–1.0)
Monocytes Relative: 8 % (ref 3–12)
Neutro Abs: 4.3 10*3/uL (ref 1.7–7.7)
Neutrophils Relative %: 57 % (ref 43–77)

## 2010-11-28 LAB — CBC
HCT: 39 % (ref 36.0–46.0)
Hemoglobin: 12.4 g/dL (ref 12.0–15.0)
MCHC: 31.8 g/dL (ref 30.0–36.0)
MCV: 89.3 fL (ref 78.0–100.0)
Platelets: 326 10*3/uL (ref 150–400)
RBC: 4.36 MIL/uL (ref 3.87–5.11)
RDW: 14.3 % (ref 11.5–15.5)
WBC: 7.5 10*3/uL (ref 4.0–10.5)

## 2010-11-28 LAB — URINALYSIS, ROUTINE W REFLEX MICROSCOPIC
Bilirubin Urine: NEGATIVE
Glucose, UA: NEGATIVE mg/dL
Ketones, ur: NEGATIVE mg/dL
Leukocytes, UA: NEGATIVE
Nitrite: NEGATIVE
Protein, ur: NEGATIVE mg/dL
Specific Gravity, Urine: 1.019 (ref 1.005–1.030)
Urobilinogen, UA: 1 mg/dL (ref 0.0–1.0)
pH: 7.5 (ref 5.0–8.0)

## 2010-11-30 ENCOUNTER — Other Ambulatory Visit: Payer: Self-pay | Admitting: Family Medicine

## 2010-11-30 DIAGNOSIS — M1990 Unspecified osteoarthritis, unspecified site: Secondary | ICD-10-CM

## 2010-11-30 DIAGNOSIS — G500 Trigeminal neuralgia: Secondary | ICD-10-CM | POA: Insufficient documentation

## 2010-11-30 DIAGNOSIS — M792 Neuralgia and neuritis, unspecified: Secondary | ICD-10-CM

## 2010-11-30 HISTORY — DX: Trigeminal neuralgia: G50.0

## 2010-11-30 NOTE — Telephone Encounter (Signed)
I spoke with the patient and she has agreed to see Dr Vickey Huger and possibly see someone at [facility] if they accept her insurance.

## 2010-12-01 LAB — POCT URINALYSIS DIP (DEVICE)
Glucose, UA: NEGATIVE mg/dL
Ketones, ur: 15 mg/dL — AB
Nitrite: POSITIVE — AB
Protein, ur: >=300 mg/dL — AB
Specific Gravity, Urine: 1.025 (ref 1.005–1.030)
Urobilinogen, UA: 1 mg/dL (ref 0.0–1.0)
pH: 5 (ref 5.0–8.0)

## 2010-12-01 LAB — URINE CULTURE
Colony Count: NO GROWTH
Culture: NO GROWTH

## 2010-12-04 LAB — COMPREHENSIVE METABOLIC PANEL
ALT: 22 U/L (ref 0–35)
AST: 21 U/L (ref 0–37)
Albumin: 3.7 g/dL (ref 3.5–5.2)
Alkaline Phosphatase: 85 U/L (ref 39–117)
BUN: 9 mg/dL (ref 6–23)
CO2: 28 mEq/L (ref 19–32)
Calcium: 9.1 mg/dL (ref 8.4–10.5)
Chloride: 103 mEq/L (ref 96–112)
Creatinine, Ser: 0.85 mg/dL (ref 0.4–1.2)
GFR calc Af Amer: >60 mL/min (ref >60)
GFR calc non Af Amer: >60 mL/min (ref >60)
Glucose, Bld: 116 mg/dL — ABNORMAL HIGH (ref 70–99)
Potassium: 3.6 mEq/L (ref 3.5–5.1)
Sodium: 137 mEq/L (ref 135–145)
Total Bilirubin: 0.7 mg/dL (ref 0.3–1.2)
Total Protein: 7 g/dL (ref 6.0–8.3)

## 2010-12-04 LAB — CLOSTRIDIUM DIFFICILE EIA
C difficile Toxins A+B, EIA: NEGATIVE
C difficile Toxins A+B, EIA: NEGATIVE

## 2010-12-04 LAB — FECAL LACTOFERRIN, QUANT
Fecal Lactoferrin: NEGATIVE
Fecal Lactoferrin: POSITIVE

## 2010-12-04 LAB — STOOL CULTURE

## 2010-12-04 LAB — DIFFERENTIAL
Basophils Absolute: 0 10*3/uL (ref 0.0–0.1)
Basophils Absolute: 0 10*3/uL (ref 0.0–0.1)
Basophils Absolute: 0.1 10*3/uL (ref 0.0–0.1)
Basophils Relative: 0 % (ref 0–1)
Basophils Relative: 1 % (ref 0–1)
Basophils Relative: 1 % (ref 0–1)
Eosinophils Absolute: 0 10*3/uL (ref 0.0–0.7)
Eosinophils Absolute: 0.4 10*3/uL (ref 0.0–0.7)
Eosinophils Absolute: 0.4 10*3/uL (ref 0.0–0.7)
Eosinophils Relative: 0 % (ref 0–5)
Eosinophils Relative: 4 % (ref 0–5)
Eosinophils Relative: 6 % — ABNORMAL HIGH (ref 0–5)
Lymphocytes Relative: 1 % — ABNORMAL LOW (ref 12–46)
Lymphocytes Relative: 21 % (ref 12–46)
Lymphocytes Relative: 28 % (ref 12–46)
Lymphs Abs: 0.2 10*3/uL — ABNORMAL LOW (ref 0.7–4.0)
Lymphs Abs: 1.4 10*3/uL (ref 0.7–4.0)
Lymphs Abs: 2.6 10*3/uL (ref 0.7–4.0)
Monocytes Absolute: 0.1 10*3/uL (ref 0.1–1.0)
Monocytes Absolute: 0.8 10*3/uL (ref 0.1–1.0)
Monocytes Absolute: 1.3 10*3/uL — ABNORMAL HIGH (ref 0.1–1.0)
Monocytes Relative: 1 % — ABNORMAL LOW (ref 3–12)
Monocytes Relative: 13 % — ABNORMAL HIGH (ref 3–12)
Monocytes Relative: 15 % — ABNORMAL HIGH (ref 3–12)
Neutro Abs: 12 10*3/uL — ABNORMAL HIGH (ref 1.7–7.7)
Neutro Abs: 4 10*3/uL (ref 1.7–7.7)
Neutro Abs: 4.8 10*3/uL (ref 1.7–7.7)
Neutrophils Relative %: 53 % (ref 43–77)
Neutrophils Relative %: 60 % (ref 43–77)
Neutrophils Relative %: 98 % — ABNORMAL HIGH (ref 43–77)

## 2010-12-04 LAB — URINALYSIS, ROUTINE W REFLEX MICROSCOPIC
Bilirubin Urine: NEGATIVE
Bilirubin Urine: NEGATIVE
Glucose, UA: NEGATIVE mg/dL
Glucose, UA: NEGATIVE mg/dL
Ketones, ur: NEGATIVE mg/dL
Leukocytes, UA: NEGATIVE
Nitrite: NEGATIVE
Nitrite: NEGATIVE
Protein, ur: 30 mg/dL — AB
Protein, ur: NEGATIVE mg/dL
Specific Gravity, Urine: 1.02 (ref 1.005–1.030)
Specific Gravity, Urine: 1.021 (ref 1.005–1.030)
Urobilinogen, UA: 0.2 mg/dL (ref 0.0–1.0)
Urobilinogen, UA: 0.2 mg/dL (ref 0.0–1.0)
pH: 5.5 (ref 5.0–8.0)
pH: 8.5 — ABNORMAL HIGH (ref 5.0–8.0)

## 2010-12-04 LAB — URINE CULTURE
Colony Count: NO GROWTH
Culture: NO GROWTH

## 2010-12-04 LAB — POCT I-STAT, CHEM 8
BUN: 7 mg/dL (ref 6–23)
Calcium, Ion: 1.1 mmol/L — ABNORMAL LOW (ref 1.12–1.32)
Chloride: 103 mEq/L (ref 96–112)
Creatinine, Ser: 0.9 mg/dL (ref 0.4–1.2)
Glucose, Bld: 114 mg/dL — ABNORMAL HIGH (ref 70–99)
HCT: 38 % (ref 36.0–46.0)
Hemoglobin: 12.9 g/dL (ref 12.0–15.0)
Potassium: 3.2 mEq/L — ABNORMAL LOW (ref 3.5–5.1)
Sodium: 138 mEq/L (ref 135–145)
TCO2: 28 mmol/L (ref 0–100)

## 2010-12-04 LAB — CBC
HCT: 36.4 % (ref 36.0–46.0)
HCT: 38 % (ref 36.0–46.0)
HCT: 41.7 % (ref 36.0–46.0)
Hemoglobin: 11.8 g/dL — ABNORMAL LOW (ref 12.0–15.0)
Hemoglobin: 12.1 g/dL (ref 12.0–15.0)
Hemoglobin: 13.4 g/dL (ref 12.0–15.0)
MCHC: 31.8 g/dL (ref 30.0–36.0)
MCHC: 32.2 g/dL (ref 30.0–36.0)
MCHC: 32.3 g/dL (ref 30.0–36.0)
MCV: 88.9 fL (ref 78.0–100.0)
MCV: 89.2 fL (ref 78.0–100.0)
MCV: 89.8 fL (ref 78.0–100.0)
Platelets: 271 10*3/uL (ref 150–400)
Platelets: 279 10*3/uL (ref 150–400)
Platelets: 295 10*3/uL (ref 150–400)
RBC: 4.08 MIL/uL (ref 3.87–5.11)
RBC: 4.24 MIL/uL (ref 3.87–5.11)
RBC: 4.69 MIL/uL (ref 3.87–5.11)
RDW: 13.7 % (ref 11.5–15.5)
RDW: 13.8 % (ref 11.5–15.5)
RDW: 14 % (ref 11.5–15.5)
WBC: 12.2 10*3/uL — ABNORMAL HIGH (ref 4.0–10.5)
WBC: 6.7 10*3/uL (ref 4.0–10.5)
WBC: 9.1 10*3/uL (ref 4.0–10.5)

## 2010-12-04 LAB — BASIC METABOLIC PANEL
BUN: 3 mg/dL — ABNORMAL LOW (ref 6–23)
BUN: 5 mg/dL — ABNORMAL LOW (ref 6–23)
CO2: 27 mEq/L (ref 19–32)
CO2: 31 mEq/L (ref 19–32)
Calcium: 8.5 mg/dL (ref 8.4–10.5)
Calcium: 8.5 mg/dL (ref 8.4–10.5)
Chloride: 105 mEq/L (ref 96–112)
Chloride: 107 mEq/L (ref 96–112)
Creatinine, Ser: 0.71 mg/dL (ref 0.4–1.2)
Creatinine, Ser: 0.72 mg/dL (ref 0.4–1.2)
GFR calc Af Amer: >60 mL/min (ref >60)
GFR calc Af Amer: >60 mL/min (ref >60)
GFR calc non Af Amer: >60 mL/min (ref >60)
GFR calc non Af Amer: >60 mL/min (ref >60)
Glucose, Bld: 101 mg/dL — ABNORMAL HIGH (ref 70–99)
Glucose, Bld: 109 mg/dL — ABNORMAL HIGH (ref 70–99)
Potassium: 3 mEq/L — ABNORMAL LOW (ref 3.5–5.1)
Potassium: 3.6 mEq/L (ref 3.5–5.1)
Sodium: 140 mEq/L (ref 135–145)
Sodium: 140 mEq/L (ref 135–145)

## 2010-12-04 LAB — URINE MICROSCOPIC-ADD ON

## 2010-12-04 LAB — CK TOTAL AND CKMB (NOT AT ARMC)
CK, MB: 0.7 ng/mL (ref 0.3–4.0)
Relative Index: INVALID (ref 0.0–2.5)
Total CK: 89 U/L (ref 7–177)

## 2010-12-04 LAB — CULTURE, BLOOD (ROUTINE X 2)
Culture: NO GROWTH
Culture: NO GROWTH

## 2010-12-04 LAB — TROPONIN I
Troponin I: 0.01 ng/mL (ref 0.00–0.06)
Troponin I: <0.01 ng/mL (ref 0.00–0.06)
Troponin I: <0.01 ng/mL (ref 0.00–0.06)

## 2010-12-04 LAB — OVA AND PARASITE EXAMINATION: Ova and parasites: NONE SEEN

## 2010-12-04 LAB — MAGNESIUM
Magnesium: 1.8 mg/dL (ref 1.5–2.5)
Magnesium: 1.9 mg/dL (ref 1.5–2.5)

## 2010-12-04 LAB — PHOSPHORUS
Phosphorus: 1.8 mg/dL — ABNORMAL LOW (ref 2.3–4.6)
Phosphorus: 2.6 mg/dL (ref 2.3–4.6)
Phosphorus: 2.9 mg/dL (ref 2.3–4.6)

## 2010-12-04 LAB — HEMOGLOBIN A1C
Hgb A1c MFr Bld: 5.9 % (ref 4.6–6.1)
Mean Plasma Glucose: 123 mg/dL

## 2010-12-04 LAB — PROTIME-INR
INR: 1.03 (ref 0.00–1.49)
Prothrombin Time: 13.4 seconds (ref 11.6–15.2)

## 2010-12-04 LAB — APTT: aPTT: 29 seconds (ref 24–37)

## 2010-12-04 LAB — LIPASE, BLOOD: Lipase: 26 U/L (ref 11–59)

## 2011-01-23 NOTE — Op Note (Signed)
NAMEROZA, CREAMER NO.:  [id number]   MEDICAL RECORD NO.:  [id number]          PATIENT TYPE:  INP   LOCATION:  2899                         FACILITY:  [facility]   PHYSICIAN:  Angelia Mould. Derrell Lolling, M.D.DATE OF BIRTH:  [birth year]   DATE OF PROCEDURE:  02/20/2007  DATE OF DISCHARGE:                               OPERATIVE REPORT   PREOPERATIVE DIAGNOSIS:  Functioning colostomy, status post perforated  diverticulitis with peritonitis.   POSTOPERATIVE DIAGNOSIS:  Functioning colostomy, status post perforated  diverticulitis with peritonitis.   OPERATION PERFORMED:  Resection of colostomy, closure of colostomy with  coloproctostomy.   SURGEON:  Angelia Mould. Derrell Lolling, MD   FIRST ASSISTANT:  Velora Heckler, MD   OPERATIVE INDICATIONS:  This is a 60 year old black female who presented  with a perforated sigmoid diverticulitis with peritonitis on July 17, 2006.  She underwent sigmoid colectomy and colostomy at that time.  She  was on steroids at that time because of trigeminal neuralgia but is not  on that any more.  She recovered from the surgery.  She has had slow  wound healing but has returned to normal health.  She wants to have her  colostomy closed.  She has had a barium enema, rigid proctoscopy and  colonoscopy.  She had only scattered diverticula but no inflammatory  changes or masses.  Proctoscopy got up to about 13 cm.  She is brought  to operating room electively following a bowel prep at home.   OPERATIVE TECHNIQUE:  Following the induction of general endotracheal  anesthesia, the patient was placed in a modified dorsal lithotomy  position in rigid stirrups.  A Foley catheter was inserted.  The abdomen  and perineum were prepped and draped in a sterile fashion.  Intravenous  antibiotics were given prior to the incision.  The patient was  identified as to correct patient and correct procedure.   The lower midline scar was excised.  Subcutaneous tissue was  divided  with electrocautery.  The fascia was divided with electrocautery.  The  peritoneal space was carefully entered.  There were a lot of soft  chronic adhesions in the lower abdomen and pelvis.  We had to dissect  the omentum away and then reflect it superiorly.  There were several  adhesions of the small bowel in the left lower quadrant as well as the  pelvis and we took all of these adhesions down, freeing up the entire  small bowel.  This was done uneventfully.  There was no apparent injury.  We then packed away the small bowel with self-retaining retractors.   We identified the Prolene sutures on the rectal stump below the sacral  promontory.  These were adherent to the left fallopian tube and ovarian  complex.  We dissected the rectal stump away from the left adnexa.  We  had some bleeding from the left ovary, which required a figure-of-eight  suture of 2-0 silk to control.  We also had a little bit of bleeding in  the midline retroperitoneum just above the sacral promontory, which was  controlled with silk sutures.  This was very  superficial.  We felt that  we had the rectum mobilized enough at this point.   I then cut around the edge of the colostomy and mobilized the colostomy  away from subcutaneous tissue and fascia and dissected it away and  returned it to the abdominal cavity.  I took a few more adhesions down  and it looked like we had plenty of length to reach down into the pelvis  for the anastomosis.  I resected about 4-5 cm of colon back to where it  had come through the fascia and transected this with a knife and sent  the colostomy to pathology for routine histology.  We had healthy pink  colon with good blood supply and it bled easily.  A pursestring suture  of 2-0 Prolene was placed in the proximal sigmoid colon cut end.  This  was done as a baseball stitch.  We then measured this and found that we  could place a 29-mm sizer in this.  We then brought a 29-mm EEA  stapler  into the operative field.  The anvil was removed from the stapler and  placed within the proximal colon and the pursestring suture tied down.   At this point Dr. Gerrit Friends went down to the perineum.  He gently dilated  the anal sphincters.  He inserted the stapler.  There was some tethering  of the posterior rectum to the hollow of the sacrum, and there was a  little bit of abrasion of the mucosa due to the stapler, but ultimately  we got the stapler up nicely and actually were able to position it so  that the stapler could be opened anterior to the old le line.  The  stapler was then opened and the spike came through quite nicely.  I then  secured the proximal colon and an anvil onto the stapler and made sure  there was no twisting.  The stapler was then closed, fired, opened and  removed.  We had two nice complete donuts of tissue.  Dr. Gerrit Friends  performed a rigid proctoscopy and found the anastomosis at about the 11  cm mark.  The staple line was not bleeding.  We insufflated this under  pressure and there was no leakage and no air bubbles underwater.  He  said there was a little bit of abrasion of the mucosa posteriorly well  below the anastomosis, which he thought was trauma from the stapler.  We  felt that this was superficial and would heal.  There was no active  bleeding.   We then changed our instruments and gloves.  We irrigated out the lower  abdomen and pelvis one more time.  We placed Tisseel on the anastomosis  and let that harden for about 3 or 4 minutes.  The small bowel and  omentum were returned to their anatomic positions.  The fascia in the  left lower quadrant where the colostomy was closed with six interrupted  sutures of #1 Novofil.  The midline fascia was closed with running  sutures of double-stranded #1 PDS.  The wounds were irrigated with  saline and the skin was closed with skin staples.  A couple of Telfa wicks were placed in the colostomy wound to  promote drainage.  Clean  bandages were placed and the patient taken recovery to the room in  stable condition.  Estimated blood loss was about 150 mL.  Complications:  None.  Sponge, needle and instrument counts were  correct.  Angelia Mould. Derrell Lolling, M.D.  Electronically Signed     HMI/MEDQ  D:  02/20/2007  T:  02/20/2007  Job:  [id number]   cc:   Tinnie Gens A. Tawanna Cooler, MD  Iva Boop, MD,FACG

## 2011-01-23 NOTE — Discharge Summary (Signed)
[name], [name] NO.:  [id number]   MEDICAL RECORD NO.:  [id number]          PATIENT TYPE:  INP   LOCATION:  5733                         FACILITY:  [facility]   PHYSICIAN:  Angelia Mould. Derrell Lolling, M.D.DATE OF BIRTH:  1951-01-21   DATE OF ADMISSION:  02/20/2007  DATE OF DISCHARGE:  02/24/2007                               DISCHARGE SUMMARY   FINAL DIAGNOSES:  1. Functioning colostomy.  2. History of ruptured diverticulitis with peritonitis, status post      sigmoid colectomy with colostomy.  3. Trigeminal neuralgia.  4. Obesity.   OPERATION PERFORMED:  Resection and closure of colostomy.   DATE OF SURGERY:  February 20, 2007.   HISTORY:  This is a 60 year old black female who underwent emergency  sigmoid colectomy and colostomy for perforated diverticulitis with  peritonitis on July 18, 2007.  She recovered from that surgery,  although had wound-healing problems, ultimately healed her wounds.  She  was evaluated with a barium enema and proctoscopy as an outpatient and  found no other significant disease.  She expressed desire to have her  colostomy closed.  I discussed the indications, details and risks of  this with her as an outpatient and she was in favor of this plan.  She  underwent a 2-day bowel prep at home and brought to the hospital  electively.   HOSPITAL COURSE:  On the day of admission, the patient taken to  operating room and underwent resection and closure of her colostomy;  this required excision of the old scar and the lower midline laparotomy.  Postoperatively, she did fairly well, progressed in her diet and  activities in an uneventful fashion and was ready for discharge on February 24, 2007.  Pathology report confirmed benign fibrosis without any other  significant active disease.  At time of discharge, she was tolerating  her diet, felt good and wanted to go home, had had several stools, her  wound looked good and her hemoglobin was 10.8.  She  was asked to follow  up with me in the office in about 4-5 days for staple removal.  Diet and  activities were discussed.      Angelia Mould. Derrell Lolling, M.D.  Electronically Signed     HMI/MEDQ  D:  03/30/2007  T:  03/31/2007  Job:  [id number]   cc:   Tinnie Gens A. Tawanna Cooler, MD

## 2011-01-26 NOTE — Discharge Summary (Signed)
Summer [name], Summer [name]            ACCOUNT NO.:  [id number]   MEDICAL RECORD NO.:  [id number]          PATIENT TYPE:  INP   LOCATION:  5737                         FACILITY:  [facility]   PHYSICIAN:  Maisie Fus A. Cornett, M.D.DATE OF BIRTH:  [birth year]   DATE OF ADMISSION:  07/16/2006  DATE OF DISCHARGE:  07/25/2006                                 DISCHARGE SUMMARY   ADMISSION DIAGNOSIS:  1. Trigeminal neuralgia.  2. Perforated diverticulitis.   DISCHARGE DIAGNOSES:  1. Trigeminal neuralgia.  2. Perforated diverticulitis.   PROCEDURE PERFORMED:  For laparotomy with sigmoid colectomy and sigmoid  colectomy and colostomy.   BRIEF HISTORY:  The patient is a 60 year old female admitted July 16, 2006, due to acute diverticulitis and perforated diverticulitis.  She was  taken to the operating room on July 17, 2006, for laparotomy and sigmoid  colectomy.   HOSPITAL COURSE:  The patient was admitted to the intensive care unit  postoperatively.  She was on a steroid secondary to trigeminal neuralgia and  was seen by her neurologist.  She was placed on IV steroids, and this was  changed to p.o. steroids when she was taking a diet.  She had a slow  resolution of her ileus.  This took 2-3 days to happen.  Her ostomy remained  pink, functioning and she had an open wound which is granulating quite  nicely.  She was placed on Lopressor during this admission due to some  hypertension and sliding scale insulin due to hyperglycemia.  She has no  baseline diabetes or hypertension that she knows of.  Her diet was slowly  advanced over the next 4-5 days.  Bowel function returned and she was  ambulating.  She was discharged home on July 25, 2006, in satisfactory  condition.   DISCHARGE INSTRUCTIONS:  The patient will follow with Dr. Derrell Lolling in 1-2  weeks.  Home health has been arranged for wound care and ostomy care.  She  will shower when she gets home and coordinate this with Home Health  visits.  She was on Lopressor in the hospital.  We will go ahead and wean that for  the time being over the next 5 days, putting her on 12.5 mg b.i.d.  She was  on 25 mg b.i.d.,until she sees her medical doctor to reassess her blood  pressure.  She will take Percocet for pain and will be off all antibiotics.   CONDITION ON DISCHARGE:  Improved.      Thomas A. Cornett, M.D.  Electronically Signed     TAC/MEDQ  D:  07/25/2006  T:  07/25/2006  Job:  [id number]

## 2011-01-26 NOTE — Consult Note (Signed)
NAME:  Summer [name], Summer [name]                      ACCOUNT NO.:  [id number]   MEDICAL RECORD NO.:  [id number]                   PATIENT TYPE:  INP   LOCATION:  5727                                 FACILITY:  [facility]   PHYSICIAN:  Stefani Dama, M.D.               DATE OF BIRTH:  Jan 16, 1951   DATE OF CONSULTATION:  01/21/2003  DATE OF DISCHARGE:                                   CONSULTATION   REQUESTING PHYSICIAN:  Rene Paci, M.D.   REASON FOR REQUEST:  Back pain.   HISTORY OF PRESENT ILLNESS:  The patient is a 60 year old female who has had  the recent onset of severe low back pain with radiation into the right side  of the back and buttock without significant radiation into the legs.  She  denies any numbness, tingling, or weakness, or any bladder problems in her  lower extremities.  She presented to the emergency department on Jan 19, 2003 because of the severity of the back pain and incapacity to move.  Her  evaluation demonstrated that she had normal neurologic function in the lower  extremities.  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  No neural compromise was  identified.   PAST MEDICAL HISTORY:  Reveals that her general health has been good.  She  reports no significant medical problems to me other than some difficulties  with sinus drainage on occasion.   ALLERGIES:  No known drug allergies.   PHYSICAL EXAMINATION:  GENERAL:  She is an alert, oriented, cooperative  individual in no overt distress.  She describes significant back pain with  turning and moves from side to side very slowly and cautiously, complaining  of some catching back pain in the lower lumbar spine with some radiation out  towards the right buttock.  NEUROLOGIC:  Examination of her back reveals she has some modest tenderness  to palpation at the  lumbosacral junction, particularly off to the right  side.  Modest muscle spasm in noted in her lower lumbar spine.  Her motor  strength, however, reveals good strength in the iliopsoas, quadriceps,  tibialis anterior, and gastrocnemius.  Sensation is intact distally.  Deep  tendon reflexes are 2+ in the patellae and the Achilles.  Straight leg  raising reproduces back pain at 45 degrees in either lower extremity.  Patrick's maneuver is negative bilaterally.  Rotation about her pelvis also  is tender in her back with vigorous movement.   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  I believe it would be best to treat  this most conservatively with a course of some nonsteroidal  antiinflammatories; IV Toradol could be tried early on to see if this gives  her relief.  Oral nonsteroidal antiinflammatories would be helpful adjunct,  followed by progressive ambulation.  Physical therapy could be used to  provide some help with maneuvers and some modalities to help lessen her pain  and discomfort.  Three to five days of bedrest is recommended at first,  followed by progressive ambulation with assistance as necessary.  Most  patient's will respond to this conservative modality without the need to  resort to injectable steroid medications, although if the patient is  particularly refractory  a facet joint block at L4-5 would be the best procedure to consider for her.  I did discuss some simple straight leg raising maneuvers and knee-to-chest  positioning while the patient is recumbent as some nonweightbearing  stretches to see if this will help with her facet joint syndrome.                                               Stefani Dama, M.D.    Merla Riches  D:  01/21/2003  T:  01/22/2003  Job:  [id number]

## 2011-01-26 NOTE — H&P (Signed)
Summer [name], KAUER NO.:  [id number]   MEDICAL RECORD NO.:  [id number]          PATIENT TYPE:  INP   LOCATION:  2550                         FACILITY:  [facility]   PHYSICIAN:  Angelia Mould. Derrell Lolling, M.D.DATE OF BIRTH:  [birth year]   DATE OF ADMISSION:  07/16/2006  DATE OF DISCHARGE:                                HISTORY & PHYSICAL   CHIEF COMPLAINT:  Lower abdominal pain.   HISTORY OF PRESENT ILLNESS:  This is a pleasant, 60 year old, morbidly obese  black female who came to the emergency department for evaluation of lower  abdominal pain.   Two weeks ago, she underwent surgery for a right trigeminal neuralgia at  [facility].  Apparently that surgery went well, but she has been  on a high-dose dexamethasone taper since that time.   She reports being constipated yesterday, but this morning developed more  severe lower abdominal pain.  She denies nausea and vomiting.  Her last  bowel movement was a small bowl movement this morning, which was solid  without blood, and occurred only after her fiance gave her an enema.   She thinks that she may have been treated for diverticulitis as an  outpatient in the past, but has never been hospitalized.   She came to the emergency department where she was evaluated by the  emergency department physician.  She is tachycardic, but alert and stable.  A CT scan shows inflamed sigmoid colon with possibly a small 1-cm x 3-cm  abscess in the left lower quadrant and a few small air bubbles scattered  about, suggesting a micro-perforation.  There is no bowel obstruction or  significant abscess.  She is being admitted for further evaluation and  management.   PAST MEDICAL HISTORY:  1. Right trigeminal neuralgia, status post surgery at [facility] two weeks ago.  2. Possible diverticulitis in the past.  3. Partial hysterectomy in the past.  4. Morbid obesity.   MEDICATIONS:  Current medications:  1.  Gabapentin 300 mg one tablet twice a day and two tablets at night.  2. Oxycodone.  3. Tegretol XR 400 mg one tablet twice daily.  4. Ranitidine 300 mg twice a day.  5. Dexamethasone 4 mg.  She takes two tablets three times a day and is      supposed to be tapering that every three days over the next two weeks.   ALLERGIES:  NO KNOWN DRUG ALLERGIES.   SOCIAL HISTORY:  She is engaged to be married.  She has one son from a  previous relationship.  Denies tobacco or drugs.  Drinks alcohol  occasionally.  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   FAMILY HISTORY:  Mother deceased, had diabetes.  Father deceased, unknown  cause.   REVIEW OF SYSTEMS:  A 15-system review of systems is performed and is  noncontributory except as described above.   PHYSICAL EXAMINATION:  GENERAL:  A pleasant, morbidly obese black female who  is alert and gives a good history, but seems to be in mild to moderate  distress.  Her  fiance is with her.  VITAL SIGNS:  Temperature 100.4.  Heart rate 123.  Respirations 16.  Blood  pressure of 168/80, left arm.  Oxygen saturation on room air 94%.  EYES:  Sclerae clear.  Extraocular movements are intact.  EARS, NOSE, MOUTH AND THROAT:  Nose, lips, tongue and oropharynx are without  gross lesions.  SKIN:  She has a well-healed scar behind her right ear.  NECK:  Supple, nontender.  I do not feel any mass or jugulovenous  distension.  LUNGS:  Clear to auscultation.  No chest wall tenderness.  HEART:  Regular rate and rhythm, no murmur.  Radial and femoral pulses are  palpable.  BREASTS:  Not examined.  ABDOMEN:  Obese.  Fairly soft in the upper abdomen.  Minimal bowel sounds.  Fairly tender across the entire lower abdomen with some guarding.  Exam is  compromised by her obesity and panniculus.  She seems to have a well-healed  Pfannenstiel incision.  She does not appear to be distended and there is no  palpable mass.  EXTREMITIES:  Moves all four  extremities well without obvious deformity.  NEUROLOGIC:  No gross motor or sensory deficits.   ADMISSION DATA:  CT scan described above.  Hemoglobin 15.5, platelet and  cell count 25,300. Urinalysis looks fairly clear.  Complete metabolic panel  shows a glucose of 146, creatinine 0.8, basically normal otherwise.   ASSESSMENT:  1. Acute diverticulitis with a  focal micro-perforation.  2. Trigeminal neuralgia, currently on steroids, two weeks post-op.  3. Morbid obesity.  4. Status post partial hysterectomy.   PLAN:  1. The patient will be admitted for rehydration and broad-spectrum      antibiotic coverage and bowel rest.  2. The patient is advised that we are going to treat her conservatively      initially, but if she fails to respond, she may require colon resection      and colostomy.  3. I will ask her neurologist to evaluate her as regards to the management      of her trigeminal neuralgia in the short term.  It certainly seems that      she will need some steroid coverage for now, although that will      compromise management of her infectious process.      Angelia Mould. Derrell Lolling, M.D.  Electronically Signed     HMI/MEDQ  D:  07/16/2006  T:  07/17/2006  Job:  [id number]   cc:   Tinnie Gens A. Tawanna Cooler, MD

## 2011-01-26 NOTE — Op Note (Signed)
Summer [name], Summer [name] NO.:  [id number]   MEDICAL RECORD NO.:  [id number]          PATIENT TYPE:  INP   LOCATION:  2303                         FACILITY:  [facility]   PHYSICIAN:  Angelia Mould. Derrell Lolling, M.D.DATE OF BIRTH:  11/04/1950   DATE OF PROCEDURE:  07/17/2006  DATE OF DISCHARGE:                                 OPERATIVE REPORT   PREOPERATIVE DIAGNOSIS:  Perforated diverticulitis with peritonitis.   POSTOPERATIVE DIAGNOSIS:  Perforated diverticulitis with peritonitis.   OPERATION PERFORMED:  Exploratory laparotomy, resection of mid and distal  sigmoid and proximal rectum, end proximal sigmoid colostomy.   SURGEON:  Angelia Mould. Derrell Lolling, M.D.   FIRST ASSISTANT:  Wilmon Arms. Tsuei, M.D.   OPERATIVE INDICATIONS:  This is a 60 year old black female who has recently  been on steroids following surgery for trigeminal neuralgia.  She presented  to the emergency room yesterday with a 12-hour history of lower abdominal  pain.  She was found to have tachycardia and fever and a CT scan showed an  inflamed sigmoid colon and a few air bubbles outside the colon.  She was  admitted and placed on broad spectrum antibiotics.  Her condition has  deteriorated this morning with tachycardia and fever to 102 and by my exam,  her pain is worse and more diffuse.  She is brought to operating room  urgently.   OPERATIVE FINDINGS:  The patient had what appeared to be perforated  diverticulitis of either the distal sigmoid or proximal rectum.  There was a  lot of cloudy infected fluid throughout the mid abdomen and pelvis.  There  was some exudate on the small bowel.  I found no signs of cancer or bowel  obstruction.  It appeared that she had had a hysterectomy before.  Her  fallopian tubes and ovaries appeared to be in place without any signs of  pathology.   OPERATIVE TECHNIQUE:  Following induction of general endotracheal  anesthesia, the patient's abdomen was prepped and draped in  sterile fashion.  The patient was identified as to correct patient and correct procedure.  Following prepping and draping, a lower midline incision was made.  The  fascia was incised in the midline.  The abdominal cavity was entered and  explored with findings as described above.  Cloudy purulent fluid was sent  for culture.  This was evacuated.  The small bowel was packed away with lap  pads.  Self-retaining retractor was placed.  I mobilized the distal  descending colon and sigmoid colon by dividing lateral peritoneal  attachments.  The inflammatory mass was actually fairly distal in the  sigmoid colon just at the level of the sacral promontory or slightly beyond.  I divided the mid sigmoid colon just above the inflammatory process with a  GIA stapling device.  Mesenteric vessels were controlled and divided with  the LigaSure device as well as some were controlled with 2-0 silk ties.  The  dissection was kept close to the colon to avoid injury to retroperitoneal  structures.  I carried the dissection on down into the true pelvis until I  was completely below the  inflamed part.  I transected the mid rectum just  above the level of the peritoneal reflection with a contour stapling device  and removed the specimen.  The stump of the rectum was marked on the left  and on the right with 2-0 Prolene sutures which were cut about 1 inch long.  This was irrigated out and intact.  I then mobilized the sigmoid colon  further until I had enough length to go through the abdominal wall.  We then  removed the packs and self-retaining retractors and then irrigated out the  subphrenic spaces of the abdomen and pelvis with about 8 liters of saline  until everything looked clear.  We checked all the areas of dissection and  we had good hemostasis.  We prepared the colostomy on the left side at about  the level of the umbilicus.  We excised a button of skin overlying the left  rectus muscle.  Subcutaneous  fat was debrided away.  The anterior rectus  sheath was incised in a cruciate fashion.  Rectus muscle was split and the  posterior rectus sheath was incised and this was dilated until we could  easily pass two and a half fingers through this.  We brought the colon  carefully through this being careful not to twist it and it appeared quite  viable.  We actually had more length of colon than we needed.  We checked  once again for bleeding.  There was none.  A 19-French Blake drain was  placed in the pelvis and brought out through a stab wound in the right lower  quadrant and sutured to the skin with nylon suture and connected to a  suction bulb.  We placed a large sheet of Seprafilm in the pelvis to protect  the rectal stump from the small bowel.  We then let the small bowel return  to its anatomic position.  We let the transverse colon and omentum return to  their anatomic position.  The midline fascia was closed with a running  suture of #1 PDS and six interrupted sutures of #1 Novofil to act as  internal retentions.  I placed a few staples around the umbilicus but  basically packed the wound open with Kerlix.  The colostomy was then matured  with interrupted sutures of 3-0 Vicryl.  I amputated some of the redundant  colon and matured the colostomy with about 10 interrupted sutures of 3-0  Vicryl.  The colon at the colostomy site was pink and bled well and appeared  viable.  I was able to pass my finger through the colostomy and past the  fascia without any obstruction.  Clean colostomy bag was placed.  Clean  bandages placed on the abdominal wound following packing with saline  moistened Kerlix.  The patient tolerated the procedure well and was taken to  the recovery room in stable condition.   ESTIMATED BLOOD LOSS:  About 250 mL.   COMPLICATIONS:  None.   Sponge, needle and instrument counts were correct.      Angelia Mould. Derrell Lolling, M.D.  Electronically Signed    HMI/MEDQ  D:   07/17/2006  T:  07/17/2006  Job:  [id number]   cc:   Tinnie Gens A. Tawanna Cooler, MD  Genene Churn. Love, M.D.

## 2011-01-26 NOTE — Consult Note (Signed)
Summer [name], Summer [name] NO.:  [id number]   MEDICAL RECORD NO.:  [id number]          PATIENT TYPE:  INP   LOCATION:  5707                         FACILITY:  [facility]   PHYSICIAN:  Genene Churn. Love, M.D.    DATE OF BIRTH:  May 24, 1951   DATE OF CONSULTATION:  07/16/2006  DATE OF DISCHARGE:                                   CONSULTATION   This 60 year old right-handed black single female is seen in the emergency  room for treatment of trigeminal neuralgia.   HISTORY OF PRESENT ILLNESS:  The patient has no known prior history of high  blood pressure, diabetes, heart disease, stroke or serious medical  conditions.  She developed the onset of right-sided facial pain involving  primarily the V3 distribution in March of 2007 and was seen by Dr. Vickey Huger  in April of 2007 at which time an MRI was thought to be normal.  Other  review of the MRIs have been thought to show evidence of a vessel at the  dorsal nerve root entry zone on the right and she underwent surgery  07/05/2006 by Dr. Leanord Asal, neurosurgeon at [facility].  At 2 weeks postoperatively she has been on  gabapentin 300 mg tablets 1 b.i.d. and 2 at night as well as Tegretol XR 400  mg b.i.d.  She had some sharp shooting pain in the right lower jaw  approximately 2 days ago, but otherwise has done very well postoperatively  from standpoint of her jaw pain.  On the other hand she had significant  obstipation and belly pain and is seen in the emergency room by Dr. Claud Kelp and admitted for diverticulitis.   CURRENT MEDICATIONS:  Gabapentin 300 mg one b.i.d. and two q.h.s., oxycodone  with APAP 1 q.6h. p.r.n. pain, Tegretol XR 400 mg one b.i.d., ranitidine 150  mg two b.i.d., and Decadron 4 mg two t.i.d. p.o. then 4 mg 2 tablets p.o.  t.i.d. and then in a tapering schedule.   PHYSICAL EXAMINATION:  A well-developed obese female.  Blood pressure right  and left arm 110/80 and  120/80, heart rate was 64.  There were no bruits.  The neck flexion and extension maneuvers were unremarkable.  Mental status:  She was alert and oriented x3.  Followed one, two and three-step commands.  Her cranial nerve examination revealed visual fields full.  Disks flat.  Spontaneous venous pulsations seen.  Extraocular movements full.  Corneals  present.  Facial sensation equal.  No facial motor asymmetry.  Hearing  present.  Air conduction greater than bone conduction.  Tongue midline.  Uvula midline.  Gag is present.  Sternocleidomastoid and trapezius testing  normal.  Good strength in the upper extremities.  Hard to evaluate the lower  extremities because of pain.  Deep tendon reflex 1 to 2+.  Plantar responses  downgoing.   IMPRESSION:  Right-sided trigeminal neuralgia primarily V3 with good  response to microvascular decompression.  Will try to cover her while she is  n.p.o. on Keppra 250 mg q.6h.  ______________________________  Genene Churn. Sandria Manly, M.D.    JML/MEDQ  D:  07/16/2006  T:  07/17/2006  Job:  [id number]   cc:   Tinnie Gens A. Tawanna Cooler, MD  Angelia Mould. Derrell Lolling, M.D.

## 2011-01-26 NOTE — Discharge Summary (Signed)
   NAME:  Summer [name], Summer [name]                      ACCOUNT NO.:  [id number]   MEDICAL RECORD NO.:  [id number]                   PATIENT TYPE:  INP   LOCATION:  5727                                 FACILITY:  [facility]   PHYSICIAN:  Rene Paci, M.D. [facility]          DATE OF BIRTH:  11-16-1950   DATE OF ADMISSION:  01/20/2003  DATE OF DISCHARGE:  01/22/2003                                 DISCHARGE SUMMARY   DISCHARGE DIAGNOSES:  1. Lumbar facet syndrome.  2. Low back pain.   BRIEF ADMISSION HISTORY:  The patient is a 60 year old African American  female who developed sudden onset severe low back pain on Jan 19, 2003.  She  was seen in the office and given Demerol and Phenergan.  The patient had  negative straight leg raise.  She does describe more pain on the right  radiating into her groin and down to her knee.   PAST MEDICAL HISTORY:  1. Status post hysterectomy secondary to fibroids.  2. Status post tonsillectomy and adenoidectomy.   HOSPITAL COURSE:  Low back pain:  The patient was admitted for further  evaluation including MRI of her back.  MRI revealed slight degenerative  changes at L4-5 with facet arthropathy.  There was no evidence of neural  compromise, however.  The patient was seen in consultation by Dr. Danielle Dess.  He felt this was likely a facet syndrome at L4-5 with radicular pain.  He  recommended conservative management including nonsteroidal anti-  inflammatories, physical therapy and modalities and gentle stretching, bed  rest for the next 3-5 days with progressive ambulation.  He felt that if  this was ineffective that at that time we might want to consider steroid  injection.   LABORATORY DATA AT DISCHARGE:  C-MET was normal, BMET was normal, UA was  negative.   MEDICATIONS AT DISCHARGE:  1. Vicodin 5/500 mg one to two tabs q.4-6 h. p.r.n.  2.     Valium 2 mg one tab q.8 h. p.r.n.  3. Vioxx 25 mg daily.   FOLLOWUP:  Follow up with Dr. Tawanna Cooler in two  weeks.     Eliberto Ivory. Cashwell, P.A.-C  [facility]            Rene Paci, M.D. [facility]    LSC/MEDQ  D:  01/22/2003  T:  01/22/2003  Job:  [id number]   cc:   Stefani Dama, M.D.  [address]  [place]  [place] [address]  Fax: [phone]   Eugenio Hoes. Tawanna Cooler, M.D. [facility]

## 2011-06-27 LAB — CBC
HCT: 33.1 — ABNORMAL LOW
Hemoglobin: 10.8 — ABNORMAL LOW
MCHC: 32.7
MCV: 88.4
Platelets: 306
RBC: 3.75 — ABNORMAL LOW
RDW: 13.8
WBC: 9.4

## 2011-06-28 LAB — URINALYSIS, ROUTINE W REFLEX MICROSCOPIC
Bilirubin Urine: NEGATIVE
Glucose, UA: NEGATIVE
Hgb urine dipstick: NEGATIVE
Ketones, ur: NEGATIVE
Leukocytes, UA: NEGATIVE
Nitrite: NEGATIVE
Protein, ur: NEGATIVE
Specific Gravity, Urine: 1.01 (ref 1.005–1.035)
Urobilinogen, UA: 0.2
pH: 7

## 2011-06-28 LAB — DIFFERENTIAL
Basophils Absolute: 0.1
Basophils Relative: 1
Eosinophils Absolute: 0.3
Eosinophils Relative: 3
Lymphocytes Relative: 37
Lymphs Abs: 3.1
Monocytes Absolute: 0.4
Monocytes Relative: 5
Neutro Abs: 4.4
Neutrophils Relative %: 53

## 2011-06-28 LAB — CBC
HCT: 32.1 — ABNORMAL LOW
HCT: 40.8
Hemoglobin: 10.6 — ABNORMAL LOW
Hemoglobin: 13.3
MCHC: 32.6
MCHC: 33.1
MCV: 86.5
MCV: 87.1
Platelets: 280
Platelets: 345
RBC: 3.69 — ABNORMAL LOW
RBC: 4.71
RDW: 13.8
RDW: 14.4 — ABNORMAL HIGH
WBC: 10.1
WBC: 8.3

## 2011-06-28 LAB — COMPREHENSIVE METABOLIC PANEL
ALT: 14
AST: 20
Albumin: 3.9
Alkaline Phosphatase: 106
BUN: 10
CO2: 30
Calcium: 9.8
Chloride: 102
Creatinine, Ser: 0.64
GFR calc Af Amer: >60
GFR calc non Af Amer: >60
Glucose, Bld: 90
Potassium: 3.9
Sodium: 138
Total Bilirubin: 0.9
Total Protein: 7.2

## 2011-09-11 HISTORY — PX: COLONOSCOPY: SHX174

## 2011-09-24 ENCOUNTER — Ambulatory Visit (INDEPENDENT_AMBULATORY_CARE_PROVIDER_SITE_OTHER): Payer: Federal, State, Local not specified - PPO | Admitting: Family Medicine

## 2011-09-24 ENCOUNTER — Encounter: Payer: Self-pay | Admitting: Family Medicine

## 2011-09-24 VITALS — BP 120/80 | Temp 98.8°F | Wt 258.0 lb

## 2011-09-24 DIAGNOSIS — M549 Dorsalgia, unspecified: Secondary | ICD-10-CM

## 2011-09-24 MED ORDER — HYDROCODONE-ACETAMINOPHEN 7.5-750 MG PO TABS: ORAL_TABLET | ORAL | Status: DC

## 2011-09-24 NOTE — Progress Notes (Signed)
  Subjective:    Patient ID: Summer [name], female    DOB: 1950/09/20, 61 y.o.   MRN: [id number]  HPI C. Is a 61 year old female, who comes in today for evaluation of right sided low back pain for one week.  She states about a week ago.  She had the gradual onset of right lower back pain, which he describes as dull, comes and goes, a two on a scale of one to 10, and it stays in the right flank.  No fever, chills, nausea, vomiting, diarrhea, or urinary habit changes.  No history of trauma.  Pain.  She did have a kidney stone.  The ago, but was totally different type of pain that she is having now.  However, when she did have the stone.  The CTA showed no stone, although she had flank pain, consistent with a stone.  She states this is a burning type pain.  She also has a history of fibromyalgia and is being treated by her rheumatologist.   Review of Systems General an musculoskeletal urologic review of systems all negative   Objective:   Physical Exam Well-developed well-nourished, female, in no acute distress.  Examination is normal.  Examination of back spine straight.  No palpable tenderness.  No rash.  Neurologic exam normal       Assessment & Plan:  Right-sided low back pain, unknown etiology.  Treat symptomatically with Flexeril and Vicodin.  Return p.r.n.

## 2011-09-24 NOTE — Patient Instructions (Signed)
Take the Vicodin, one half to one tablet up to 3 times a day as needed for back pain.  Return p.r.n.

## 2011-09-27 ENCOUNTER — Ambulatory Visit (INDEPENDENT_AMBULATORY_CARE_PROVIDER_SITE_OTHER): Payer: Federal, State, Local not specified - PPO | Admitting: Family Medicine

## 2011-09-27 ENCOUNTER — Encounter: Payer: Self-pay | Admitting: Family Medicine

## 2011-09-27 DIAGNOSIS — M549 Dorsalgia, unspecified: Secondary | ICD-10-CM

## 2011-09-27 DIAGNOSIS — B029 Zoster without complications: Secondary | ICD-10-CM | POA: Insufficient documentation

## 2011-09-27 HISTORY — DX: Zoster without complications: B02.9

## 2011-09-27 MED ORDER — HYDROCODONE-ACETAMINOPHEN 7.5-750 MG PO TABS: 1.0000 | ORAL_TABLET | Freq: Three times a day (TID) | ORAL | Status: AC | PRN

## 2011-09-27 MED ORDER — ACYCLOVIR 400 MG PO TABS: 400.0000 mg | ORAL_TABLET | ORAL | Status: AC

## 2011-09-27 NOTE — Progress Notes (Signed)
  Subjective:    Patient ID: Summer [name], female    DOB: [birth year], 61 y.o.   MRN: [id number]  HPI C. [age]-year-old female, who comes in today for evaluation of back pain and a skin rash.  Last week she noticed back pain and then 3 days later noticed a skin rash.  It's a burning-like pain that involves the lumbar area rating to the right flank area       Review of Systems    General and dermatologic review of systems otherwise negative Objective:   Physical Exam Well-developed well-nourished, female in no acute distress.  Examination of back shows a linear rash in the L4 dermatome consistent with a zoster       Assessment & Plan:  Zoster plan acyclovir, and pain medication.  Follow-up in 4 days

## 2011-09-27 NOTE — Patient Instructions (Signed)
Take one of the acyclovir tablets 4 times daily.  Vicodin ES.......... One half to one tablet every 4 to 6 hours as needed for pain.  Return next Tuesday for follow-up

## 2011-09-28 ENCOUNTER — Telehealth: Payer: Self-pay | Admitting: *Deleted

## 2011-09-28 NOTE — Telephone Encounter (Signed)
Dr. Tawanna Cooler diagnosed pt with shingles yesterday, and her manager wants to know if she is contagious to any of her coworkers?

## 2011-09-28 NOTE — Telephone Encounter (Signed)
Notified pt. 

## 2011-09-28 NOTE — Telephone Encounter (Signed)
no

## 2011-09-29 ENCOUNTER — Encounter: Payer: Self-pay | Admitting: Family Medicine

## 2011-10-02 ENCOUNTER — Encounter: Payer: Self-pay | Admitting: Family Medicine

## 2011-10-02 ENCOUNTER — Ambulatory Visit (INDEPENDENT_AMBULATORY_CARE_PROVIDER_SITE_OTHER): Payer: Federal, State, Local not specified - PPO | Admitting: Family Medicine

## 2011-10-02 DIAGNOSIS — B029 Zoster without complications: Secondary | ICD-10-CM

## 2011-10-02 NOTE — Progress Notes (Signed)
  Subjective:    Patient ID: Summer [name], female    DOB: 05-Sep-1951, 61 y.o.   MRN: [id number]  HPI C. Is a 61 year old female, who comes in today for follow-up of shingles.  We saw her last week with the acute onset of shingles and started on Zovirax 400 mg q.i.d.Marland Kitchen  She comes back today stating her pain, which was a 10 is down to a 3.  No side effects from medication   Review of Systems    In general, and dermatologic review of systems otherwise negative Objective:   Physical Exam Well-developed well-nourished, female, in no acute distress.  Examination.  Skin shows healing zoster, L3 to the flank area.  No distention of the lesions       Assessment & Plan:  Shingles healing continue Zovirax return p.r.n.

## 2011-10-02 NOTE — Patient Instructions (Signed)
Continue the Zovirax 4 times daily until you are pain free.  Return p.r.n.

## 2012-04-15 ENCOUNTER — Telehealth: Payer: Self-pay | Admitting: Family Medicine

## 2012-04-15 NOTE — Telephone Encounter (Signed)
[facility] Triage Call Report Triage Record Num: [id number] Operator: Aundra Millet Patient Name: Summer [name] Call Date & Time: 04/14/2012 5:28:21PM Patient Phone: [phone] PCP: Eugenio Hoes. Todd Patient Gender: Female PCP Fax : [phone] Patient DOB: 1951/01/16 [facility] Name: [facility] Reason for Call: Caller: [name]/Patient; PCP: Roderick Pee.; CB#: [phone]; Call regarding Chest Pain/Chest Discomfort; Today, 04/14/2012 , pt calling with c/o of increased flatulence and at night with laying down has pain in shoulder back and chest - symptoms last 30 minutes. Tends to have some relief with sitting up. Has noticed even when she lays down to rest during the day has the chest pain. No trouble breathing. Denies reflux. Today has had no chest pain , just passing alot of gas with bad odor. RN reached ED Dispositon for symptoms worse when lying down and better with sitting and leaning forward per Chest pain Protocol . RN advised to proceed to [facility] ED now for evaluation and pt agreed Protocol(s) Used: Chest Pain Recommended Outcome per Protocol: See ED Immediately Reason for Outcome: Symptoms worse when lying down AND better when sitting and leaning forward Care Advice: ~ Another adult should drive. ~ Do not eat or drink anything until evaluated by provider. Call EMS 911 if chest pain or difficulty breathing worsens or new swelling of lower extremities or gradual abdomen swelling. ~ ~ IMMEDIATE

## 2012-04-22 ENCOUNTER — Telehealth: Payer: Self-pay | Admitting: *Deleted

## 2012-04-22 ENCOUNTER — Other Ambulatory Visit: Payer: Self-pay | Admitting: Family Medicine

## 2012-04-22 ENCOUNTER — Telehealth: Payer: Self-pay | Admitting: Internal Medicine

## 2012-04-22 DIAGNOSIS — R198 Other specified symptoms and signs involving the digestive system and abdomen: Secondary | ICD-10-CM

## 2012-04-22 NOTE — Telephone Encounter (Signed)
2 week history of dark tarry "gooey stool".  She reports that her stools are very foul smelling and she is experiencing excess gas.  She will come in and see Mike Gip PA tomorrow at 2:30

## 2012-04-22 NOTE — Telephone Encounter (Signed)
Patient is calling with BM changes.  She is complaining of "dark, sticky, tar like stools".  She also complains of increased gas and bloating.     Per Dr Tawanna Cooler - GI ASAP

## 2012-04-23 ENCOUNTER — Encounter: Payer: Self-pay | Admitting: Internal Medicine

## 2012-04-23 ENCOUNTER — Encounter: Payer: Self-pay | Admitting: Physician Assistant

## 2012-04-23 ENCOUNTER — Other Ambulatory Visit (INDEPENDENT_AMBULATORY_CARE_PROVIDER_SITE_OTHER): Payer: Federal, State, Local not specified - PPO

## 2012-04-23 ENCOUNTER — Ambulatory Visit (INDEPENDENT_AMBULATORY_CARE_PROVIDER_SITE_OTHER): Payer: Federal, State, Local not specified - PPO | Admitting: Physician Assistant

## 2012-04-23 ENCOUNTER — Ambulatory Visit: Payer: Federal, State, Local not specified - PPO | Admitting: Family Medicine

## 2012-04-23 VITALS — BP 118/64 | HR 68 | Ht 65.25 in | Wt 230.1 lb

## 2012-04-23 DIAGNOSIS — K921 Melena: Secondary | ICD-10-CM

## 2012-04-23 DIAGNOSIS — K219 Gastro-esophageal reflux disease without esophagitis: Secondary | ICD-10-CM

## 2012-04-23 LAB — CBC WITH DIFFERENTIAL/PLATELET
Basophils Absolute: 0 10*3/uL (ref 0.0–0.1)
Basophils Relative: 0.6 % (ref 0.0–3.0)
Eosinophils Absolute: 0.2 10*3/uL (ref 0.0–0.7)
Eosinophils Relative: 2.6 % (ref 0.0–5.0)
HCT: 38.1 % (ref 36.0–46.0)
Hemoglobin: 12.3 g/dL (ref 12.0–15.0)
Lymphocytes Relative: 32.4 % (ref 12.0–46.0)
Lymphs Abs: 2.6 10*3/uL (ref 0.7–4.0)
MCHC: 32.2 g/dL (ref 30.0–36.0)
MCV: 89.7 fl (ref 78.0–100.0)
Monocytes Absolute: 0.4 10*3/uL (ref 0.1–1.0)
Monocytes Relative: 5.5 % (ref 3.0–12.0)
Neutro Abs: 4.7 10*3/uL (ref 1.4–7.7)
Neutrophils Relative %: 58.9 % (ref 43.0–77.0)
Platelets: 322 10*3/uL (ref 150.0–400.0)
RBC: 4.25 Mil/uL (ref 3.87–5.11)
RDW: 13.8 % (ref 11.5–14.6)
WBC: 8 10*3/uL (ref 4.5–10.5)

## 2012-04-23 MED ORDER — ESOMEPRAZOLE MAGNESIUM 40 MG PO CPDR: 40.0000 mg | DELAYED_RELEASE_CAPSULE | Freq: Every day | ORAL | Status: DC

## 2012-04-23 MED ORDER — PEG-KCL-NACL-NASULF-NA ASC-C 100 G PO SOLR: 1.0000 | Freq: Once | ORAL | Status: DC

## 2012-04-23 NOTE — Patient Instructions (Addendum)
No allergy to soy or eggs  Your physician has requested that you go to the basement for the following lab work before leaving today: CBC  You have been scheduled for a colonoscopy/Endoscopy with Dr. Leone Payor with propofol. Please follow written instructions given to you at your visit today.  Please pick up your prep kit at the pharmacy within the next 1-3 days. If you use inhalers (even only as needed), please bring them with you on the day of your procedure.   We have sent the following medications to your pharmacy for you to pick up at your convenience: Moviprep; you were given instructions today at your visit

## 2012-04-23 NOTE — Progress Notes (Signed)
Subjective:    Patient ID: Summer [name], female    DOB: 15-Mar-1951, 61 y.o.   MRN: [id number]  HPI Summer [name] is a pleasant 61 year old female known to Dr. Leone Payor from remote colonoscopy done in 2004. At that time she had severe diverticular disease of the left colon and external hemorrhoids. She has since undergone a segmental colectomy with colostomy and then reversal in 2008 or perforated diverticulitis. Other medical problems include fibromyalgia trigeminal neuralgia and obesity.  Patient comes in today with complaint of two-week history of significant malodorous gas associated with very dark tarry sticky stools. She had started having some indigestion that the onset of her symptoms and had taken some Pepto-Bismol which she stopped about 3 days ago. She had some epigastric discomfort but no significant pain or cramping. She has been having 4-8 bowel movements per day which she says have all appeared dark sticky and tarry but has not had any obvious blood or clots. She lists last took Pepto-Bismol 3-4 days ago and says that her stools looked the same. She is not taking any regular aspirin or NSAIDs. Her appetite has been fair. She had lost weight over the past several months but this has been intentional. She has no current complaints of dysphagia or odynophagia. She says she has felt somewhat fatigued but denies any dizziness lightheadedness etc. She is no had prior history of bleeding or ulcer disease .    Review of Systems  Constitutional: Positive for fatigue.  HENT: Negative.   Eyes: Negative.   Respiratory: Negative.   Cardiovascular: Negative.   Gastrointestinal: Positive for abdominal pain and diarrhea.  Genitourinary: Negative.   Musculoskeletal: Negative.   Neurological: Negative.   Hematological: Negative.   Psychiatric/Behavioral: Negative.    Outpatient Prescriptions Prior to Visit  Medication Sig Dispense Refill  . carbamazepine (CARBATROL) 300 MG 12 hr capsule Take  300 mg by mouth 3 (three) times daily.       Marland Kitchen docusate sodium (COLACE) 100 MG capsule Take 100 mg by mouth 2 (two) times daily.        . DULoxetine (CYMBALTA) 30 MG capsule Take 30 mg by mouth 3 (three) times daily.      Marland Kitchen HYDROcodone-acetaminophen (VICODIN ES) 7.5-750 MG per tablet One half to one tablet 3 times daily as needed for back pain  50 tablet  1  . Melatonin 5 MG CAPS Take by mouth.      . methocarbamol (ROBAXIN) 500 MG tablet Take 500 mg by mouth 3 (three) times daily.        Marland Kitchen zolpidem (AMBIEN) 10 MG tablet Take 10 mg by mouth at bedtime as needed.             Allergies  Allergen Reactions  . Clindamycin Hcl     itch   Patient Active Problem List  Diagnosis  . ALLERGIC RHINITIS  . VULVA INTRAEPITHELIAL NEOPLASIA, VIN I  . 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 DIVERTICULITIS, HX OF  . HEMATURIA, HX OF  . Obesity  . Fibromyalgia  . Neuralgia  . Back pain, acute  . Zoster without complications   History   Social History  . Marital Status: Married    Spouse Name: N/A    Number of Children: 1  . Years of Education: N/A   Occupational History  .     Social History Main Topics  . Smoking status: Never Smoker   . Smokeless tobacco: Never Used  . Alcohol Use: Yes  rarely-1-2 drinks a year  . Drug Use: No  . Sexually Active: Not on file   Other Topics Concern  . Not on file   Social History Narrative  . No narrative on file    Objective:   Physical Exam  well-developed AA female in no acute distress, accompanied by her husband, pleasant blood pressure 118/64 pulse 68 height 5 foot 5 weight 2:30. HEENT; nontraumatic normocephalic EOMI PERRLA sclera anicteric,;neck; Supple no JVD, Cardiovascular; regular rate and rhythm with S1-S2 no murmur or gallop, Pulmonary; clear bilaterally Abdomen; large soft bowel sounds are present she has mild tenderness in the epigastrium there is no guarding no rebound no palpable mass or hepatosplenomegaly,  Rectal; exam of very dark stool but no not grossly melenic, Hemoccult-positive. Extremities; no clubbing cyanosis or edema skin warm and dry, Psych; mood and affect normal and appropriate.        Assessment & Plan:  #61 61 year old female with 2 week history of melena. She does have very dark Hemoccult positive stool on exam, but had also been taking Pepto-Bismol up until 3 days ago. Melena coincided with increased indigestion and epigastric discomfort. Will need to rule out esophagitis peptic ulcer disease or occult lesion. I suspect her symptoms are upper in origin though cannot absolutely rule out lower GI pathology with low-grade bleeding. #2 history of diverticular disease status post segmental colectomy 2008 for perforated diverticulitis #3 colon neoplasia surveillance-last colonoscopy 2004, no polyps  #4 fibromyalgia #5 trigeminal neuralgia  Plan; CBC today Start Nexium 40 mg by mouth twice daily, short-term-samples were given Schedule for upper endoscopy and colonoscopy with Dr. Leone Payor, patient is very anxious to know whether there is anything wrong with her colon since she has had such significant problems with diverticular disease in the past, she would be due for routine followup within the next year as well, and prefers to have both procedures done at the same time. Procedures were discussed in detail with the patient and her husband and they are agreeable to proceed. Patient was advised should she have any increase in dark stools or more overt bleeding that she should seek evaluation at the emergency room. She knows not to use any more Pepto-Bismol as well.

## 2012-04-24 NOTE — Progress Notes (Signed)
i agree with the plan outlined in this note 

## 2012-04-29 ENCOUNTER — Ambulatory Visit (AMBULATORY_SURGERY_CENTER): Payer: Federal, State, Local not specified - PPO | Admitting: Internal Medicine

## 2012-04-29 ENCOUNTER — Encounter: Payer: Self-pay | Admitting: Internal Medicine

## 2012-04-29 VITALS — BP 126/52 | HR 80 | Temp 98.4°F | Resp 20 | Ht 65.25 in | Wt 230.0 lb

## 2012-04-29 DIAGNOSIS — K921 Melena: Secondary | ICD-10-CM

## 2012-04-29 DIAGNOSIS — K5730 Diverticulosis of large intestine without perforation or abscess without bleeding: Secondary | ICD-10-CM

## 2012-04-29 MED ORDER — SODIUM CHLORIDE 0.9 % IV SOLN: 500.0000 mL | INTRAVENOUS | Status: DC

## 2012-04-29 NOTE — Op Note (Signed)
[facility] [address] [place], [address]   COLONOSCOPY PROCEDURE REPORT  PATIENT: Summer, [name]  MR#: [id number] BIRTHDATE: 1950-10-02 , 61  yrs. old GENDER: Female ENDOSCOPIST: Iva Boop, MD, [facility]  PROCEDURE DATE:  04/29/2012 PROCEDURE:   Colonoscopy, diagnostic ASA CLASS:   Class II INDICATIONS:melena. MEDICATIONS: There was residual sedation effect present from prior procedure, propofol (Diprivan) 180mg  IV, MAC sedation, administered by CRNA, and These medications were titrated to patient response per physician's verbal order  DESCRIPTION OF PROCEDURE:   After the risks benefits and alternatives of the procedure were thoroughly explained, informed consent was obtained.  A digital rectal exam revealed no abnormalities of the rectum.   The LB [id number]  endoscope was introduced through the anus and advanced to the terminal ileum which was intubated for a short distance. No adverse events experienced.   The quality of the prep was excellent, using MoviPrep  The instrument was then slowly withdrawn as the colon was fully examined.      COLON FINDINGS: Mild diverticulosis was noted throughout the entire examined colon.   There was evidence of a prior end-to-end colo-colonic surgical anastomosis The finding was in the left colon.   The colon mucosa was otherwise normal.   The mucosa appeared normal in the terminal ileum.  Retroflexed views revealed no abnormalities. The time to cecum=1:22 minutes  Withdrawal time=6:30 minutes.  The scope was withdrawn and the procedure completed. COMPLICATIONS: There were no complications. ENDOSCOPIC IMPRESSION: 1.   Mild diverticulosis was noted throughout the entire examined colon 2.   There was evidence of a prior colo-colonic surgical anastomosis in the left colon 3.   The colon mucosa was otherwise normal 4.   The mucosa appeared normal in the terminal ileum  RECOMMENDATIONS: 1) Observe  for now - seems better, no cause of melena found - may have had a transient infection. 2) Recheck CBC in 1 month, my office to arrange 3) See me as needed 4) Colonoscopy routine in 10 years 2023  eSigned:  Iva Boop, MD, [facility] 04/29/2012 4:58 PM   cc: NW:[id number] Shawnie Dapper, MD cc:The Patient   PATIENT NAME:  Summer, [name] MR#: [id number]

## 2012-04-29 NOTE — Op Note (Signed)
[facility] [address] [place], [address]   ENDOSCOPY PROCEDURE REPORT  PATIENT: Summer [name], Summer [name]  MR#: [id number] BIRTHDATE: 1951/05/15 , 61  yrs. old GENDER: Female ENDOSCOPIST: Iva Boop, MD, [facility] REFERRED BY: PROCEDURE DATE:  04/29/2012 PROCEDURE:  EGD, diagnostic ASA CLASS:     Class II INDICATIONS:  melena.  suspectred, did use pepto Bismol but had dark heme + stool and Hgb 12.7 MEDICATIONS: Glycopyrrolate (Robinul) .1 mg IV, propofol (Diprivan) 220mg  IV, MAC sedation, administered by CRNA, and These medications were titrated to patient response per physician's verbal order TOPICAL ANESTHETIC: Cetacaine Spray  DESCRIPTION OF PROCEDURE: After the risks benefits and alternatives of the procedure were thoroughly explained, informed consent was obtained.  The [facility] GIF-H180 [id number] endoscope was introduced through the mouth and advanced to the second portion of the duodenum. limited by Without limitations. The instrument was slowly withdrawn as the mucosa was fully examined.    The upper, middle and distal third of the esophagus were carefully inspected and no abnormalities were noted.  The z-line was well seen at the GEJ.  The endoscope was pushed into the fundus which was normal including a retroflexed view.  The antrum, gastric body, first and second part of the duodenum were unremarkable. Retroflexed views revealed no abnormalities.     The scope was then withdrawn from the patient and the procedure completed.  COMPLICATIONS: There were no complications. ENDOSCOPIC IMPRESSION: Normal EGD  RECOMMENDATIONS: Proceed with a Colonscopy.    eSigned:  Iva Boop, MD, [facility] 04/29/2012 4:49 PM   CC:cc:Jeffrey Shawnie Dapper, MD cc:The Patient

## 2012-04-29 NOTE — Progress Notes (Signed)
Patient did not experience any of the following events: a burn prior to discharge; a fall within the facility; wrong site/side/patient/procedure/implant event; or a hospital transfer or hospital admission upon discharge from the facility. (G8907) Patient did not have preoperative order for IV antibiotic SSI prophylaxis. (G8918)  

## 2012-04-29 NOTE — Patient Instructions (Addendum)
The upper endoscopy exam (esophagus, stomach and duodenum) was normal.  The colonoscopy showed some diverticulosis but no other problems.  No cause of bleeding seen - you may have had a transient infection that has cleared.  I would observe and give it some time and see me again or call back if needed, or you may call soon and schedule a routine follow-up for 6 weeks if desired.  I do want to make sure your blood count is ok in 1 month with a CBC (complete blood count) since it was slightly low. My office will contact you about that.  Thank you for choosing me and [facility].  Iva Boop, MD, FACG   YOU HAD AN ENDOSCOPIC PROCEDURE TODAY AT THE [facility]: Refer to the procedure report that was given to you for any specific questions about what was found during the examination.  If the procedure report does not answer your questions, please call your gastroenterologist to clarify.  If you requested that your care partner not be given the details of your procedure findings, then the procedure report has been included in a sealed envelope for you to review at your convenience later.  YOU SHOULD EXPECT: Some feelings of bloating in the abdomen. Passage of more gas than usual.  Walking can help get rid of the air that was put into your GI tract during the procedure and reduce the bloating. If you had a lower endoscopy (such as a colonoscopy or flexible sigmoidoscopy) you may notice spotting of blood in your stool or on the toilet paper. If you underwent a bowel prep for your procedure, then you may not have a normal bowel movement for a few days.  DIET: Your first meal following the procedure should be a light meal and then it is ok to progress to your normal diet.  A half-sandwich or bowl of soup is an example of a good first meal.  Heavy or fried foods are harder to digest and may make you feel nauseous or bloated.  Likewise meals heavy in dairy and vegetables can  cause extra gas to form and this can also increase the bloating.  Drink plenty of fluids but you should avoid alcoholic beverages for 24 hours.  ACTIVITY: Your care partner should take you home directly after the procedure.  You should plan to take it easy, moving slowly for the rest of the day.  You can resume normal activity the day after the procedure however you should NOT DRIVE or use heavy machinery for 24 hours (because of the sedation medicines used during the test).    SYMPTOMS TO REPORT IMMEDIATELY: A gastroenterologist can be reached at any hour.  During normal business hours, 8:30 AM to 5:00 PM Monday through Friday, call [phone].  After hours and on weekends, please call the GI answering service at [phone] who will take a message and have the physician on call contact you.   Following lower endoscopy (colonoscopy or flexible sigmoidoscopy):  Excessive amounts of blood in the stool  Significant tenderness or worsening of abdominal pains  Swelling of the abdomen that is new, acute  Fever of 100F or higher  Following upper endoscopy (EGD)  Vomiting of blood or coffee ground material  New chest pain or pain under the shoulder blades  Painful or persistently difficult swallowing  New shortness of breath  Fever of 100F or higher  Black, tarry-looking stools  FOLLOW UP: If any biopsies were taken you will be  contacted by phone or by letter within the next 1-3 weeks.  Call your gastroenterologist if you have not heard about the biopsies in 3 weeks.  Our staff will call the home number listed on your records the next business day following your procedure to check on you and address any questions or concerns that you may have at that time regarding the information given to you following your procedure. This is a courtesy call and so if there is no answer at the home number and we have not heard from you through the emergency physician on call, we will assume that you have  returned to your regular daily activities without incident.  SIGNATURES/CONFIDENTIALITY: You and/or your care partner have signed paperwork which will be entered into your electronic medical record.  These signatures attest to the fact that that the information above on your After Visit Summary has been reviewed and is understood.  Full responsibility of the confidentiality of this discharge information lies with you and/or your care-partner.

## 2012-04-30 ENCOUNTER — Telehealth: Payer: Self-pay | Admitting: *Deleted

## 2012-04-30 NOTE — Telephone Encounter (Signed)
No answer, message left for the patient. 

## 2012-06-14 ENCOUNTER — Emergency Department (HOSPITAL_COMMUNITY): Payer: Federal, State, Local not specified - PPO

## 2012-06-14 ENCOUNTER — Encounter (HOSPITAL_COMMUNITY): Payer: Self-pay | Admitting: Emergency Medicine

## 2012-06-14 ENCOUNTER — Emergency Department (HOSPITAL_COMMUNITY)
Admission: EM | Admit: 2012-06-14 | Discharge: 2012-06-15 | Disposition: A | Discharge status: Home / Self Care | Payer: Federal, State, Local not specified - PPO | Attending: Emergency Medicine | Admitting: Emergency Medicine

## 2012-06-14 DIAGNOSIS — Z8739 Personal history of other diseases of the musculoskeletal system and connective tissue: Secondary | ICD-10-CM | POA: Insufficient documentation

## 2012-06-14 DIAGNOSIS — E119 Type 2 diabetes mellitus without complications: Secondary | ICD-10-CM | POA: Insufficient documentation

## 2012-06-14 DIAGNOSIS — I10 Essential (primary) hypertension: Secondary | ICD-10-CM | POA: Insufficient documentation

## 2012-06-14 DIAGNOSIS — K5730 Diverticulosis of large intestine without perforation or abscess without bleeding: Secondary | ICD-10-CM | POA: Insufficient documentation

## 2012-06-14 DIAGNOSIS — N200 Calculus of kidney: Secondary | ICD-10-CM | POA: Insufficient documentation

## 2012-06-14 LAB — COMPREHENSIVE METABOLIC PANEL
ALT: 16 U/L (ref 0–35)
AST: 17 U/L (ref 0–37)
Albumin: 3.9 g/dL (ref 3.5–5.2)
Alkaline Phosphatase: 112 U/L (ref 39–117)
BUN: 13 mg/dL (ref 6–23)
CO2: 24 mEq/L (ref 19–32)
Calcium: 9.8 mg/dL (ref 8.4–10.5)
Chloride: 99 mEq/L (ref 96–112)
Creatinine, Ser: 0.81 mg/dL (ref 0.50–1.10)
GFR calc Af Amer: >90 mL/min (ref >90)
GFR calc non Af Amer: 78 mL/min — ABNORMAL LOW (ref >90)
Glucose, Bld: 147 mg/dL — ABNORMAL HIGH (ref 70–99)
Potassium: 3 mEq/L — ABNORMAL LOW (ref 3.5–5.1)
Sodium: 139 mEq/L (ref 135–145)
Total Bilirubin: 0.4 mg/dL (ref 0.3–1.2)
Total Protein: 7.6 g/dL (ref 6.0–8.3)

## 2012-06-14 LAB — URINE MICROSCOPIC-ADD ON

## 2012-06-14 LAB — URINALYSIS, ROUTINE W REFLEX MICROSCOPIC
Bilirubin Urine: NEGATIVE
Glucose, UA: NEGATIVE mg/dL
Ketones, ur: 15 mg/dL — AB
Leukocytes, UA: NEGATIVE
Nitrite: NEGATIVE
Protein, ur: NEGATIVE mg/dL
Specific Gravity, Urine: 1.022 (ref 1.005–1.030)
Urobilinogen, UA: 0.2 mg/dL (ref 0.0–1.0)
pH: 8.5 — ABNORMAL HIGH (ref 5.0–8.0)

## 2012-06-14 LAB — CBC WITH DIFFERENTIAL/PLATELET
Basophils Absolute: 0 10*3/uL (ref 0.0–0.1)
Basophils Relative: 0 % (ref 0–1)
Eosinophils Absolute: 0.1 10*3/uL (ref 0.0–0.7)
Eosinophils Relative: 1 % (ref 0–5)
HCT: 40.3 % (ref 36.0–46.0)
Hemoglobin: 13.6 g/dL (ref 12.0–15.0)
Lymphocytes Relative: 24 % (ref 12–46)
Lymphs Abs: 2.3 10*3/uL (ref 0.7–4.0)
MCH: 29.2 pg (ref 26.0–34.0)
MCHC: 33.7 g/dL (ref 30.0–36.0)
MCV: 86.5 fL (ref 78.0–100.0)
Monocytes Absolute: 0.7 10*3/uL (ref 0.1–1.0)
Monocytes Relative: 8 % (ref 3–12)
Neutro Abs: 6.5 10*3/uL (ref 1.7–7.7)
Neutrophils Relative %: 67 % (ref 43–77)
Platelets: 325 10*3/uL (ref 150–400)
RBC: 4.66 MIL/uL (ref 3.87–5.11)
RDW: 13.3 % (ref 11.5–15.5)
WBC: 9.6 10*3/uL (ref 4.0–10.5)

## 2012-06-14 MED ORDER — IOHEXOL 300 MG/ML  SOLN
20.0000 mL | INTRAMUSCULAR | Status: AC
Start: 2012-06-14 — End: 2012-06-14
  Administered 2012-06-14: 20 mL via ORAL

## 2012-06-14 MED ORDER — POTASSIUM CHLORIDE 10 MEQ/100ML IV SOLN
10.0000 meq | Freq: Once | INTRAVENOUS | Status: AC
  Administered 2012-06-14: 10 meq via INTRAVENOUS
  Filled 2012-06-14: qty 100

## 2012-06-14 MED ORDER — IOHEXOL 300 MG/ML  SOLN
100.0000 mL | Freq: Once | INTRAMUSCULAR | Status: AC | PRN
  Administered 2012-06-14: 100 mL via INTRAVENOUS

## 2012-06-14 MED ORDER — TAMSULOSIN HCL 0.4 MG PO CAPS: 0.4000 mg | ORAL_CAPSULE | Freq: Every day | ORAL | Status: DC

## 2012-06-14 MED ORDER — HYDROMORPHONE HCL PF 1 MG/ML IJ SOLN
1.0000 mg | Freq: Once | INTRAMUSCULAR | Status: AC
  Administered 2012-06-14: 1 mg via INTRAVENOUS
  Filled 2012-06-14: qty 1

## 2012-06-14 MED ORDER — HYDROCODONE-ACETAMINOPHEN 5-500 MG PO TABS: 1.0000 | ORAL_TABLET | Freq: Four times a day (QID) | ORAL | Status: DC | PRN

## 2012-06-14 MED ORDER — SODIUM CHLORIDE 0.9 % IV BOLUS (SEPSIS)
1000.0000 mL | Freq: Once | INTRAVENOUS | Status: AC
  Administered 2012-06-14: 1000 mL via INTRAVENOUS

## 2012-06-14 MED ORDER — POTASSIUM CHLORIDE CRYS ER 20 MEQ PO TBCR
40.0000 meq | EXTENDED_RELEASE_TABLET | Freq: Once | ORAL | Status: AC
  Administered 2012-06-14: 40 meq via ORAL
  Filled 2012-06-14: qty 2

## 2012-06-14 NOTE — ED Notes (Signed)
Pt c/o left flank pain radiating to left abdomen onset today at 1530. Pt denies n/v or urinary symptoms.

## 2012-06-14 NOTE — ED Notes (Signed)
Pt reports feels like she is going to pass out. Pt moved from wheelchair to triage chair.

## 2012-06-14 NOTE — ED Provider Notes (Signed)
History     CSN: [id number]  Arrival date & time 06/14/12  1722   First MD Initiated Contact with Patient 06/14/12 1903      Chief Complaint  Patient presents with  . Flank Pain    left    (Consider location/radiation/quality/duration/timing/severity/associated sxs/prior treatment) The history is provided by the patient.  Summer [name] is a 61 y.o. female hx of diverticulitis s/p bowel resection, kidney stones with chronic hematuria here with L sided abdominal pain. LLQ and LUQ and L flank pain with sudden onset around 3:30 pm today. It was sharp and constant since then. She has chronic hematuria that is still there but dysuria or frequency or fevers. No nausea or vomiting. She denies constipation as she is taking colace daily. She said it felt like her diverticulitis and kidney stones.    Past Medical History  Diagnosis Date  . Diverticulosis   . Allergy   . Hematuria - cause not known   . Hypertension   . History of shingles   . Fibromyalgia   . History of colon polyps   . DM (diabetes mellitus)     pt states not diabetic/no medicines  . Trigeminal neuralgia   . Arthritis     Past Surgical History  Procedure Date  . Abdominal hysterectomy   . Segmental colon     ruptured diverticulitis  . Colostomy reversal   . Trigeminal neuralgia   . Tonsillectomy   . Colonoscopy   . Wisdom tooth extraction     Family History  Problem Relation Age of Onset  . Heart disease Maternal Grandmother   . Diabetes Mother   . Breast cancer Maternal Aunt   . Lupus Cousin   . Colon cancer Neg Hx   . Esophageal cancer Neg Hx   . Rectal cancer Neg Hx   . Stomach cancer Neg Hx     History  Substance Use Topics  . Smoking status: Never Smoker   . Smokeless tobacco: Never Used  . Alcohol Use: Yes     rarely-1-2 drinks a year    OB History    Grav Para Term Preterm Abortions TAB SAB Ect Mult Living                  Review of Systems  Gastrointestinal: Positive for  abdominal pain.  Genitourinary: Positive for hematuria.  All other systems reviewed and are negative.    Allergies  Clindamycin hcl  Home Medications   Current Outpatient Rx  Name Route Sig Dispense Refill  . CARBAMAZEPINE ER 300 MG PO CP12 Oral Take 300 mg by mouth 3 (three) times daily.     . DULOXETINE HCL 30 MG PO CPEP Oral Take 30 mg by mouth 3 (three) times daily.      BP 136/61  Pulse 74  Temp 98.3 F (36.8 C) (Oral)  Resp 26  SpO2 100%  Physical Exam  Nursing note and vitals reviewed. Constitutional: She is oriented to person, place, and time. She appears well-developed and well-nourished.       Uncomfortable, writhing around.   HENT:  Head: Normocephalic.  Mouth/Throat: Oropharynx is clear and moist.  Eyes: Conjunctivae normal are normal. Pupils are equal, round, and reactive to light.  Neck: Normal range of motion. Neck supple.  Cardiovascular: Normal rate, regular rhythm and normal heart sounds.   Pulmonary/Chest: Effort normal and breath sounds normal. No respiratory distress. She has no wheezes. She has no rales.  Abdominal: Soft.  Surgical scar well healed. + tenderness in LLQ, no rebound. No CVAT.   Musculoskeletal: Normal range of motion. She exhibits no edema.  Neurological: She is alert and oriented to person, place, and time.  Skin: Skin is warm and dry.  Psychiatric: She has a normal mood and affect. Her behavior is normal. Judgment and thought content normal.    ED Course  Procedures (including critical care time)  Labs Reviewed  COMPREHENSIVE METABOLIC PANEL - Abnormal; Notable for the following:    Potassium 3.0 (*)     Glucose, Bld 147 (*)     GFR calc non Af Amer 78 (*)     All other components within normal limits  URINALYSIS, ROUTINE W REFLEX MICROSCOPIC - Abnormal; Notable for the following:    APPearance CLOUDY (*)     pH 8.5 (*)     Hgb urine dipstick MODERATE (*)     Ketones, ur 15 (*)     All other components within normal  limits  CBC WITH DIFFERENTIAL  URINE MICROSCOPIC-ADD ON   Ct Abdomen Pelvis W Contrast  06/14/2012  *RADIOLOGY REPORT*  Clinical Data: Left lower abdominal pain.  Status post partial colectomy.  CT ABDOMEN AND PELVIS WITH CONTRAST  Technique:  Multidetector CT imaging of the abdomen and pelvis was performed following the standard protocol during bolus administration of intravenous contrast.  Contrast: OMNIPAQUE IOHEXOL 300 MG/ML  SOLN  Comparison: 12/27/2009  Findings: Visualized lung bases clear.  Unremarkable liver, gallbladder, spleen, adrenal glands, pancreas, abdominal aorta, portal vein, right kidney.  There is a left hydronephrosis and proximal ureterectasis down to the level of a 6 mm mid ureteral calculus.  There are mild inflammatory/edematous changes around the left kidney and renal collecting system.  2.7 cm simple cyst in the upper pole left kidney stable.  Stomach is physiologically distended.  Incomplete passage of the oral contrast material through the decompressed small bowel. Normal appendix.  The colon is nondilated with scattered diverticula.  There is an anastomotic staple line near the rectosigmoid junction.  No focal inflammatory/edematous pericolonic changes.  The urinary bladder is non distended.  Bilateral pelvic phleboliths.  No ascites.  No free air.  Spondylitic changes in the lower lumbar spine, facet disease likely   resulting in the grade 1 anterolisthesis L4-5 which is stable.  Delayed excretion by the left kidney on delayed scans.  IMPRESSION:  1.  Obstructing 6 mm left  mid ureteral calculus with moderate hydronephrosis. 2.  Scattered colonic diverticula without CT evidence of diverticulitis or abscess.   Original Report Authenticated By: Osa Craver, M.D.      1. Kidney stone       MDM  Summer [name] is a 61 y.o. female here with LLQ pain. She is likely to have diverticulitis. Kidney stone is less likely given no CVA tenderness. Will check labs,  give pain meds, and do CT ab/pel with contrast and reassess.   10:58 PM Patient's labs showed K 3.0 (supplemented). UA + hematuria no infection. CT ab/pel showed obstructing 6mm L mid ureteral calculus with moderate hydro. Her pain improved with meds and tolerated PO. Cr at baseline. I think she will likely pass the stone and will prescribe pain meds and flomax and will follow up with her urologist.        Richardean Canal, MD 06/14/12 [phone]

## 2012-06-23 ENCOUNTER — Other Ambulatory Visit: Payer: Self-pay | Admitting: Urology

## 2012-06-30 ENCOUNTER — Encounter (HOSPITAL_BASED_OUTPATIENT_CLINIC_OR_DEPARTMENT_OTHER): Payer: Self-pay | Admitting: *Deleted

## 2012-07-01 ENCOUNTER — Encounter (HOSPITAL_BASED_OUTPATIENT_CLINIC_OR_DEPARTMENT_OTHER): Payer: Self-pay | Admitting: *Deleted

## 2012-07-01 NOTE — Progress Notes (Signed)
NPO AFTER MN. ARRIVES AT 0830. NEEDS HG AND KUB. WILL TAKE AM MEDS AND IF NEEDED HYDROCODONE AM OF SURG W/ SIPS OF WATER.

## 2012-07-03 NOTE — H&P (Signed)
Reason For Visit             Summer [name] is a 61 year old female seen in followup from the emergency room for left ureteral stone.   History of Present Illness        Nephrolithiasis: She was found to have a left upper ureteral stone by CT scan in 3/11 and was scheduled for lithotripsy of the stone however at the time of her lithotripsy the stone could not be visualized on plain film and therefore could not be treated. She had multiple phleboliths in the pelvis and I felt one of those calcifications very likely could have represented her stone in the distal ureter so I maintained her on medical expulsive therapy.    A CT scan done on 12/27/09 revealed that her left ureteral calculus had passed spontaneously. No renal calculi were identified. There was a calcification noted near the midline in the area of the bladder which could be seen on several previous KUBs and was read by the radiologist as possibly being a bladder stone however I felt the calcification or 30 was outside of the bladder. I confirmed the fact it was outside of the bladder by performing cystoscopy on 12/02/09 without any bladder calculi being identified.   Interval history: A CT scan done on 06/14/12 revealed a 6 mm mid left ureteral stone. A left renal cyst was identified that there were no renal calculi noted on the right or left side.She experienced some left lower quadrant pain and thought it was her diverticulitis versus constipation. She taken some medication to help with constipation and despite having bowel movements continued to have cramping type pain. The pain intensified and she ended up going to emergency room. She was diagnosed with a stone and since that time has had some intermittent mild discomfort. This has been associated with some pressure in the suprapubic region as well as a feeling of urgency but then when she urinates is only a small amount. She's not having any fever. She was placed on medical expulsive therapy.     Past Medical History Problems  1. History of  Arthritis V13.4 2. History of  Diverticulitis Of Colon 562.11 3. History of  Fibromyalgia 729.1 4. History of  Herpes Zoster (Shingles) 053.9 5. History of  Hypertension 401.9 6. History of  Neuralgia 729.2 7. History of  Ureteral Stone Left 592.1  Surgical History Problems  1. History of  Colon Surgery 2. History of  Complete Colonoscopy 3. History of  Oral Surgery Tooth Extraction 4. History of  Tonsillectomy 5. History of  Total Abdominal Hysterectomy V45.77  Current Meds 1. CarBAMazepine ER 300 MG Oral Capsule Extended Release 12 Hour; Therapy:  (Recorded:10Oct2013) to 2. Cymbalta 30 MG Oral Capsule Delayed Release Particles; Therapy: (Recorded:10Oct2013) to 3. Hydrocodone-Acetaminophen 5-500 MG Oral Tablet; Therapy: (Recorded:10Oct2013) to 4. Tamsulosin HCl 0.4 MG Oral Capsule; Therapy: (Recorded:10Oct2013) to  Allergies Medication  1. Cleocin CAPS 2. Clindamycin  Family History Problems  1. Maternal aunt's history of  Breast Cancer V16.3 2. Family history of  Family Health Status Number Of Children 1 son 3. Family history of  Father Deceased At Age ____ 76y/o 4. Maternal history of  Heart Disease V17.49 5. Maternal history of  Hypertension V17.49 6. Family history of  Lupus Vulgaris 7. Family history of  Mother Deceased At Age ____ 72y/o  Social History Problems  1. Alcohol Use rarely 2. Caffeine Use 2-3 3. Marital History - Single 4. Never A Smoker 5. Occupation: customer  service  Review of Systems Genitourinary, constitutional, skin, eye, otolaryngeal, hematologic/lymphatic, cardiovascular, pulmonary, endocrine, musculoskeletal, gastrointestinal, neurological and psychiatric system(s) were reviewed and pertinent findings if present are noted.  Genitourinary: urinary frequency, urinary urgency, nocturia, incontinence, urinary hesitancy, urinary stream starts and stops, hematuria and initiating urination  requires straining, but no dysuria.  ENT: sinus problems.  Musculoskeletal: back pain and joint pain.    Vitals Vital Signs  BMI Calculated: 34.72 BSA Calculated: 2.07 Height: 5 ft 6 in Weight: 216 lb  Blood Pressure: 139 / 85 Temperature: 98.4 F Heart Rate: 81  Physical Exam Constitutional: Well nourished and well developed . No acute distress.  ENT:. The ears and nose are normal in appearance.  Neck: The appearance of the neck is normal and no neck mass is present.  Pulmonary: No respiratory distress and normal respiratory rhythm and effort.  Cardiovascular: Heart rate and rhythm are normal . No peripheral edema.  Abdomen: The abdomen is mildly obese. The abdomen is soft and nontender. No masses are palpated. No CVA tenderness. No hernias are palpable. No hepatosplenomegaly noted.  Lymphatics: The femoral and inguinal nodes are not enlarged or tender.  Skin: Normal skin turgor, no visible rash and no visible skin lesions.  Neuro/Psych:. Mood and affect are appropriate.    Results/Data Urine  COLOR YELLOW   APPEARANCE CLEAR   SPECIFIC GRAVITY 1.020   pH 7.0   GLUCOSE NEG mg/dL  BILIRUBIN NEG   KETONE NEG mg/dL  BLOOD TRACE   PROTEIN NEG mg/dL  UROBILINOGEN 0.2 mg/dL  NITRITE NEG   LEUKOCYTE ESTERASE NEG   SQUAMOUS EPITHELIAL/HPF RARE   WBC 0-2 WBC/hpf  RBC 0-2 RBC/hpf  BACTERIA FEW   CRYSTALS NONE SEEN   CASTS NONE SEEN    The following images/tracing/specimen were independently visualized:  CT scan as above.  The following radiology reports were reviewed: CT scan.    Assessment Assessed  1. Mid Ureteral Stone On The Left 592.1   We discussed the fact that she has a 6 mm stone but does have a chance of spontaneous passage. She's on medical expulsive therapy and I like to maintain her on that. Her stone could not be visualized because of overlying bone and therefore lithotripsy would not be an option so we discussed ureteroscopic extraction of her stone if it does  not pass spontaneously. I went over the procedure with her in detail including its risks and palpitations, the alternatives and the limitations as well his probability of success. She would like to schedule surgery and then see if the stone passes in the interim.   Plan    1. Continue medical expulsive therapy with tamsulosin. 2. I refilled her hydrocodone. 3. She will be scheduled for left ureteroscopy as an outpatient if she doesn't pass her stone. 4. I did give her a urine strainer and she doesn't pass her stone she is going to bring it in for compositional analysis.

## 2012-07-04 ENCOUNTER — Ambulatory Visit (HOSPITAL_BASED_OUTPATIENT_CLINIC_OR_DEPARTMENT_OTHER): Payer: Federal, State, Local not specified - PPO

## 2012-07-04 ENCOUNTER — Encounter (HOSPITAL_BASED_OUTPATIENT_CLINIC_OR_DEPARTMENT_OTHER): Payer: Self-pay | Admitting: Anesthesiology

## 2012-07-04 ENCOUNTER — Encounter (HOSPITAL_BASED_OUTPATIENT_CLINIC_OR_DEPARTMENT_OTHER): Payer: Self-pay | Admitting: *Deleted

## 2012-07-04 ENCOUNTER — Ambulatory Visit (HOSPITAL_COMMUNITY): Payer: Federal, State, Local not specified - PPO

## 2012-07-04 ENCOUNTER — Encounter (HOSPITAL_BASED_OUTPATIENT_CLINIC_OR_DEPARTMENT_OTHER): Payer: Self-pay

## 2012-07-04 ENCOUNTER — Ambulatory Visit (HOSPITAL_BASED_OUTPATIENT_CLINIC_OR_DEPARTMENT_OTHER)
Admission: RE | Admit: 2012-07-04 | Discharge: 2012-07-04 | Disposition: A | Discharge status: Home / Self Care | Payer: Federal, State, Local not specified - PPO | Source: Ambulatory Visit | Attending: Urology | Admitting: Urology

## 2012-07-04 ENCOUNTER — Encounter (HOSPITAL_BASED_OUTPATIENT_CLINIC_OR_DEPARTMENT_OTHER)
Admission: RE | Disposition: A | Discharge status: Home / Self Care | Payer: Self-pay | Source: Ambulatory Visit | Attending: Urology

## 2012-07-04 DIAGNOSIS — I10 Essential (primary) hypertension: Secondary | ICD-10-CM | POA: Insufficient documentation

## 2012-07-04 DIAGNOSIS — IMO0001 Reserved for inherently not codable concepts without codable children: Secondary | ICD-10-CM | POA: Insufficient documentation

## 2012-07-04 DIAGNOSIS — Z9079 Acquired absence of other genital organ(s): Secondary | ICD-10-CM | POA: Insufficient documentation

## 2012-07-04 DIAGNOSIS — N201 Calculus of ureter: Secondary | ICD-10-CM | POA: Insufficient documentation

## 2012-07-04 DIAGNOSIS — Z79899 Other long term (current) drug therapy: Secondary | ICD-10-CM | POA: Insufficient documentation

## 2012-07-04 HISTORY — DX: Headache: R51

## 2012-07-04 HISTORY — PX: URETEROSCOPY: SHX842

## 2012-07-04 HISTORY — DX: Personal history of urinary calculi: Z87.442

## 2012-07-04 HISTORY — DX: Headache, unspecified: R51.9

## 2012-07-04 HISTORY — DX: Other chronic pain: G89.29

## 2012-07-04 HISTORY — DX: Calculus of ureter: N20.1

## 2012-07-04 HISTORY — DX: Urgency of urination: R39.15

## 2012-07-04 HISTORY — DX: Preglaucoma, unspecified, unspecified eye: H40.009

## 2012-07-04 HISTORY — PX: CYSTOSCOPY/RETROGRADE/URETEROSCOPY/STONE EXTRACTION WITH BASKET: SHX5317

## 2012-07-04 LAB — POCT HEMOGLOBIN-HEMACUE: Hemoglobin: 11.9 g/dL — ABNORMAL LOW (ref 12.0–15.0)

## 2012-07-04 LAB — GLUCOSE, CAPILLARY: Glucose-Capillary: 90 mg/dL (ref 70–99)

## 2012-07-04 SURGERY — URETEROSCOPY
Anesthesia: General | Site: Ureter | Laterality: Left | Wound class: Clean Contaminated

## 2012-07-04 MED ORDER — DEXAMETHASONE SODIUM PHOSPHATE 4 MG/ML IJ SOLN
INTRAMUSCULAR | Status: DC | PRN
  Administered 2012-07-04: 4 mg via INTRAVENOUS

## 2012-07-04 MED ORDER — ONDANSETRON HCL 4 MG/2ML IJ SOLN
INTRAMUSCULAR | Status: DC | PRN
  Administered 2012-07-04: 4 mg via INTRAVENOUS

## 2012-07-04 MED ORDER — MEPERIDINE HCL 25 MG/ML IJ SOLN: 6.2500 mg | INTRAMUSCULAR | Status: DC | PRN

## 2012-07-04 MED ORDER — CIPROFLOXACIN IN D5W 200 MG/100ML IV SOLN
200.0000 mg | INTRAVENOUS | Status: AC
  Administered 2012-07-04: 200 mg via INTRAVENOUS

## 2012-07-04 MED ORDER — PHENAZOPYRIDINE HCL 200 MG PO TABS
200.0000 mg | ORAL_TABLET | Freq: Three times a day (TID) | ORAL | Status: DC
  Administered 2012-07-04: 200 mg via ORAL

## 2012-07-04 MED ORDER — LACTATED RINGERS IV SOLN: INTRAVENOUS | Status: DC

## 2012-07-04 MED ORDER — EPHEDRINE SULFATE 50 MG/ML IJ SOLN
INTRAMUSCULAR | Status: DC | PRN
  Administered 2012-07-04 (×2): 10 mg via INTRAVENOUS

## 2012-07-04 MED ORDER — FENTANYL CITRATE 0.05 MG/ML IJ SOLN
INTRAMUSCULAR | Status: DC | PRN
  Administered 2012-07-04 (×2): 25 ug via INTRAVENOUS
  Administered 2012-07-04: 50 ug via INTRAVENOUS

## 2012-07-04 MED ORDER — IOHEXOL 350 MG/ML SOLN
INTRAVENOUS | Status: DC | PRN
  Administered 2012-07-04: 5 mL via INTRAVENOUS

## 2012-07-04 MED ORDER — PROMETHAZINE HCL 25 MG/ML IJ SOLN: 6.2500 mg | INTRAMUSCULAR | Status: DC | PRN

## 2012-07-04 MED ORDER — FENTANYL CITRATE 0.05 MG/ML IJ SOLN
25.0000 ug | INTRAMUSCULAR | Status: DC | PRN
  Administered 2012-07-04: 25 ug via INTRAVENOUS

## 2012-07-04 MED ORDER — PHENAZOPYRIDINE HCL 200 MG PO TABS: 200.0000 mg | ORAL_TABLET | Freq: Three times a day (TID) | ORAL | Status: DC | PRN

## 2012-07-04 MED ORDER — PROPOFOL 10 MG/ML IV BOLUS
INTRAVENOUS | Status: DC | PRN
  Administered 2012-07-04: 250 mg via INTRAVENOUS

## 2012-07-04 MED ORDER — HYDROCODONE-ACETAMINOPHEN 10-325 MG PO TABS
1.0000 | ORAL_TABLET | ORAL | Status: DC | PRN
  Administered 2012-07-04: 1 via ORAL

## 2012-07-04 MED ORDER — SODIUM CHLORIDE 0.9 % IR SOLN
Status: DC | PRN
  Administered 2012-07-04: 6000 mL via INTRAVESICAL

## 2012-07-04 MED ORDER — LIDOCAINE HCL (CARDIAC) 20 MG/ML IV SOLN
INTRAVENOUS | Status: DC | PRN
  Administered 2012-07-04: 75 mg via INTRAVENOUS

## 2012-07-04 MED ORDER — LACTATED RINGERS IV SOLN
INTRAVENOUS | Status: DC
  Administered 2012-07-04: 100 mL/h via INTRAVENOUS
  Administered 2012-07-04: 09:00:00 via INTRAVENOUS

## 2012-07-04 MED ORDER — HYDROCODONE-ACETAMINOPHEN 10-325 MG PO TABS: 1.0000 | ORAL_TABLET | ORAL | Status: DC | PRN

## 2012-07-04 SURGICAL SUPPLY — 33 items
ADAPTER CATH URET PLST 4-6FR (CATHETERS) IMPLANT
BAG DRAIN URO-CYSTO SKYTR STRL (DRAIN) ×3 IMPLANT
BASKET LASER NITINOL 1.9FR (BASKET) IMPLANT
BASKET STNLS GEMINI 4WIRE 3FR (BASKET) IMPLANT
BASKET ZERO TIP NITINOL 2.4FR (BASKET) ×3 IMPLANT
BRUSH URET BIOPSY 3F (UROLOGICAL SUPPLIES) IMPLANT
CANISTER SUCT LVC 12 LTR MEDI- (MISCELLANEOUS) ×3 IMPLANT
CATH INTERMIT  6FR 70CM (CATHETERS) IMPLANT
CATH URET 5FR 28IN CONE TIP (BALLOONS)
CATH URET 5FR 70CM CONE TIP (BALLOONS) IMPLANT
CLOTH BEACON ORANGE TIMEOUT ST (SAFETY) ×3 IMPLANT
DRAPE CAMERA CLOSED 9X96 (DRAPES) ×3 IMPLANT
ELECT REM PT RETURN 9FT ADLT (ELECTROSURGICAL)
ELECTRODE REM PT RTRN 9FT ADLT (ELECTROSURGICAL) IMPLANT
GLOVE BIO SURGEON STRL SZ8 (GLOVE) ×3 IMPLANT
GLOVE INDICATOR 6.5 STRL GRN (GLOVE) ×6 IMPLANT
GOWN PREVENTION PLUS LG XLONG (DISPOSABLE) ×3 IMPLANT
GOWN STRL REIN XL XLG (GOWN DISPOSABLE) ×3 IMPLANT
GUIDEWIRE 0.038 PTFE COATED (WIRE) IMPLANT
GUIDEWIRE ANG ZIPWIRE 038X150 (WIRE) IMPLANT
GUIDEWIRE STR DUAL SENSOR (WIRE) ×3 IMPLANT
IV NS IRRIG 3000ML ARTHROMATIC (IV SOLUTION) ×6 IMPLANT
KIT BALLIN UROMAX 15FX10 (LABEL) IMPLANT
KIT BALLN UROMAX 15FX4 (MISCELLANEOUS) IMPLANT
KIT BALLN UROMAX 26 75X4 (MISCELLANEOUS)
LASER FIBER DISP (UROLOGICAL SUPPLIES) IMPLANT
PACK CYSTOSCOPY (CUSTOM PROCEDURE TRAY) ×3 IMPLANT
SET HIGH PRES BAL DIL (LABEL)
SHEATH ACCESS URETERAL 38CM (SHEATH) IMPLANT
SHEATH ACCESS URETERAL 54CM (SHEATH) IMPLANT
SHEATH URET ACCESS 12FR/35CM (UROLOGICAL SUPPLIES) ×3 IMPLANT
SHEATH URET ACCESS 12FR/55CM (UROLOGICAL SUPPLIES) IMPLANT
WATER STERILE IRR 3000ML UROMA (IV SOLUTION) IMPLANT

## 2012-07-04 NOTE — Anesthesia Postprocedure Evaluation (Signed)
  Anesthesia Post-op Note  Patient: Summer [name]  Procedure(s) Performed: Procedure(s) (LRB): URETEROSCOPY (Left) CYSTOSCOPY/RETROGRADE/URETEROSCOPY/STONE EXTRACTION WITH BASKET (Left)  Patient Location: PACU  Anesthesia Type: General  Level of Consciousness: awake and alert   Airway and Oxygen Therapy: Patient Spontanous Breathing  Post-op Pain: mild  Post-op Assessment: Post-op Vital signs reviewed, Patient's Cardiovascular Status Stable, Respiratory Function Stable, Patent Airway and No signs of Nausea or vomiting  Post-op Vital Signs: stable  Complications: No apparent anesthesia complications

## 2012-07-04 NOTE — Anesthesia Preprocedure Evaluation (Addendum)
Anesthesia Evaluation  Patient identified by MRN, date of birth, ID band Patient awake    Reviewed: Allergy & Precautions, H&P , NPO status , Patient's Chart, lab work & pertinent test results  Airway Mallampati: II TM Distance: >3 FB Neck ROM: Full    Dental No notable dental hx.    Pulmonary neg pulmonary ROS,  breath sounds clear to auscultation  Pulmonary exam normal       Cardiovascular negative cardio ROS  Rhythm:Regular Rate:Normal     Neuro/Psych Trigeminal neuralgia negative neurological ROS  negative psych ROS   GI/Hepatic negative GI ROS, Neg liver ROS,   Endo/Other  negative endocrine ROS  Renal/GU negative Renal ROS  negative genitourinary   Musculoskeletal negative musculoskeletal ROS (+)   Abdominal   Peds negative pediatric ROS (+)  Hematology negative hematology ROS (+)   Anesthesia Other Findings   Reproductive/Obstetrics negative OB ROS                          Anesthesia Physical Anesthesia Plan  ASA: II  Anesthesia Plan: General   Post-op Pain Management:    Induction: Intravenous  Airway Management Planned: LMA  Additional Equipment:   Intra-op Plan:   Post-operative Plan: Extubation in OR  Informed Consent: I have reviewed the patients History and Physical, chart, labs and discussed the procedure including the risks, benefits and alternatives for the proposed anesthesia with the patient or authorized representative who has indicated his/her understanding and acceptance.   Dental advisory given  Plan Discussed with: CRNA  Anesthesia Plan Comments:         Anesthesia Quick Evaluation

## 2012-07-04 NOTE — Transfer of Care (Signed)
Immediate Anesthesia Transfer of Care Note  Patient: Summer [name]  Procedure(s) Performed: Procedure(s) (LRB): CYSTOSCOPY WITH RETROGRADE PYELOGRAM (Left) URETEROSCOPY (Left)  Patient Location: Patient transported to PACU with oxygen via face mask at 4 Liters / Min  Anesthesia Type: General  Level of Consciousness: awake and alert   Airway & Oxygen Therapy: Patient Spontanous Breathing and Patient connected to face mask oxygen  Post-op Assessment: Report given to PACU RN and Post -op Vital signs reviewed and stable  Post vital signs: Reviewed and stable  Dentition: Teeth and oropharynx remain in pre-op condition  Complications: No apparent anesthesia complications

## 2012-07-04 NOTE — Interval H&P Note (Signed)
History and Physical Interval Note:  07/04/2012 9:41 AM  Summer [name]  has presented today for surgery, with the diagnosis of Left Ureteral Stone  The various methods of treatment have been discussed with the patient and family. After consideration of risks, benefits and other options for treatment, the patient has consented to  Procedure(s) (LRB) with comments: URETEROSCOPY (Left) - 1 hour requested for this case  C-ARM CAMERA FLEX URETERO  [phone] cell # [employer] HOLMIUM LASER APPLICATION (Left) as a surgical intervention .  The patient's history has been reviewed, patient examined, no change in status, stable for surgery.  I have reviewed the patient's chart and labs.  Questions were answered to the patient's satisfaction.     Garnett Farm

## 2012-07-04 NOTE — Anesthesia Procedure Notes (Signed)
Procedure Name: LMA Insertion Date/Time: 07/04/2012 9:54 AM Performed by: Fran Lowes Pre-anesthesia Checklist: Patient identified, Emergency Drugs available, Suction available and Patient being monitored Patient Re-evaluated:Patient Re-evaluated prior to inductionOxygen Delivery Method: Circle System Utilized Preoxygenation: Pre-oxygenation with 100% oxygen Intubation Type: IV induction Ventilation: Mask ventilation without difficulty LMA: LMA inserted LMA Size: 4.0 Number of attempts: 1 Airway Equipment and Method: bite block Placement Confirmation: positive ETCO2 Tube secured with: Tape Dental Injury: Teeth and Oropharynx as per pre-operative assessment

## 2012-07-04 NOTE — Op Note (Signed)
PATIENT:  Summer [name]  PRE-OPERATIVE DIAGNOSIS:  left Ureteral calculus  POST-OPERATIVE DIAGNOSIS: Same  PROCEDURE:  1. Cystoscopy with left retrograde pyelogram including interpretation. 2. Left ureteroscopy and stone extraction  SURGEON: Garnett Farm, MD  INDICATION: Mrs. Guia is a 61 year old female who has had difficulty with a left ureteral stone. It had progressed but failed to pass and we therefore discussed the treatment options. She is elected to proceed with ureteroscopic extraction. He's been having a lot of irritative voiding symptoms. Her preop KUB reveals the stone appears to be located in the intramural ureter.  ANESTHESIA:  General  EBL:  Minimal  SPECIMEN:  Stone  DISPOSITION OF SPECIMEN:  given to patient  DESCRIPTION OF PROCEDURE: The patient was taken to the major OR and placed on the table. General anesthesia was administered and then the patient was moved to the dorsal lithotomy position. The genitalia was sterilely prepped and draped. An official timeout was performed.  Initially the 22 French cystoscope with 12 lens was passed under direct vision. The bladder was then entered and fully inspected. It was noted be free of any tumors stones or inflammatory lesions. Ureteral orifices were of normal configuration and position. I did however note the stone visible just inside the left ureteral orifice. A 6 French open-ended ureteral catheter was then passed through the cystoscope into the ureteral orifice in order to perform a left retrograde pyelogram.  A retrograde pyelogram was performed by injecting full-strength contrast up the left ureter under direct fluoroscopic control. It revealed a filling defect in the distal ureter consistent with the stone seen on the preoperative KUB. The remainder of the ureter was noted to be normal as was the intrarenal collecting system. I then passed a 0.038 inch floppy-tipped guidewire through the open ended catheter and  into the area of the renal pelvis and this was left in place. The inner portion of a ureteral access sheath was then passed over the guidewire to gently dilate the intramural ureter. I then proceeded with ureteroscopy.  A 6 French rigid ureteroscope was then passed under direct into the bladder and into the left orifice and up the ureter a very short distance.  I then used the nitinol basket to extract the stone without difficulty and reinspection of the ureter ureteroscopically revealed no further stone fragments and no injury to the ureter. I injected contrast a second time and noted the ureter appeared smooth and the contrast flowed freely out the UO. Because of the gentle intramural ureteral dilatation and ease of extraction of her stone I elected to not place a stent. The bladder was therefore drained and the cystoscope was removed. The patient tolerated the procedure well no intraoperative complications.  PLAN OF CARE: Discharge to home after PACU  PATIENT DISPOSITION:  PACU - hemodynamically stable.

## 2012-07-07 ENCOUNTER — Encounter (HOSPITAL_BASED_OUTPATIENT_CLINIC_OR_DEPARTMENT_OTHER): Payer: Self-pay | Admitting: Urology

## 2012-07-09 ENCOUNTER — Encounter (HOSPITAL_BASED_OUTPATIENT_CLINIC_OR_DEPARTMENT_OTHER): Payer: Self-pay

## 2012-08-21 ENCOUNTER — Other Ambulatory Visit (INDEPENDENT_AMBULATORY_CARE_PROVIDER_SITE_OTHER): Payer: Federal, State, Local not specified - PPO

## 2012-08-21 DIAGNOSIS — Z0000 Encounter for general adult medical examination without abnormal findings: Secondary | ICD-10-CM

## 2012-08-21 LAB — CBC WITH DIFFERENTIAL/PLATELET
Basophils Absolute: 0 10*3/uL (ref 0.0–0.1)
Basophils Relative: 0.5 % (ref 0.0–3.0)
Eosinophils Absolute: 0.2 10*3/uL (ref 0.0–0.7)
Eosinophils Relative: 3.4 % (ref 0.0–5.0)
HCT: 37.8 % (ref 36.0–46.0)
Hemoglobin: 12.2 g/dL (ref 12.0–15.0)
Lymphocytes Relative: 35.2 % (ref 12.0–46.0)
Lymphs Abs: 2.2 10*3/uL (ref 0.7–4.0)
MCHC: 32.3 g/dL (ref 30.0–36.0)
MCV: 88.8 fl (ref 78.0–100.0)
Monocytes Absolute: 0.4 10*3/uL (ref 0.1–1.0)
Monocytes Relative: 6.4 % (ref 3.0–12.0)
Neutro Abs: 3.4 10*3/uL (ref 1.4–7.7)
Neutrophils Relative %: 54.5 % (ref 43.0–77.0)
Platelets: 306 10*3/uL (ref 150.0–400.0)
RBC: 4.26 Mil/uL (ref 3.87–5.11)
RDW: 13.8 % (ref 11.5–14.6)
WBC: 6.2 10*3/uL (ref 4.5–10.5)

## 2012-08-21 LAB — HEPATIC FUNCTION PANEL
ALT: 17 U/L (ref 0–35)
AST: 20 U/L (ref 0–37)
Albumin: 3.6 g/dL (ref 3.5–5.2)
Alkaline Phosphatase: 98 U/L (ref 39–117)
Bilirubin, Direct: 0 mg/dL (ref 0.0–0.3)
Total Bilirubin: 0.5 mg/dL (ref 0.3–1.2)
Total Protein: 7 g/dL (ref 6.0–8.3)

## 2012-08-21 LAB — LDL CHOLESTEROL, DIRECT: Direct LDL: 139.9 mg/dL

## 2012-08-21 LAB — LIPID PANEL
Cholesterol: 229 mg/dL — ABNORMAL HIGH (ref 0–200)
HDL: 67.8 mg/dL (ref >39.00)
Total CHOL/HDL Ratio: 3
Triglycerides: 66 mg/dL (ref 0.0–149.0)
VLDL: 13.2 mg/dL (ref 0.0–40.0)

## 2012-08-21 LAB — BASIC METABOLIC PANEL
BUN: 13 mg/dL (ref 6–23)
CO2: 27 mEq/L (ref 19–32)
Calcium: 8.5 mg/dL (ref 8.4–10.5)
Chloride: 104 mEq/L (ref 96–112)
Creatinine, Ser: 0.7 mg/dL (ref 0.4–1.2)
GFR: 110.01 mL/min (ref >60.00)
Glucose, Bld: 105 mg/dL — ABNORMAL HIGH (ref 70–99)
Potassium: 4.3 mEq/L (ref 3.5–5.1)
Sodium: 136 mEq/L (ref 135–145)

## 2012-08-21 LAB — TSH: TSH: 0.99 u[IU]/mL (ref 0.35–5.50)

## 2012-08-28 ENCOUNTER — Encounter: Payer: Self-pay | Admitting: Family Medicine

## 2012-08-28 ENCOUNTER — Other Ambulatory Visit: Payer: Federal, State, Local not specified - PPO

## 2012-08-28 ENCOUNTER — Ambulatory Visit (INDEPENDENT_AMBULATORY_CARE_PROVIDER_SITE_OTHER): Payer: Federal, State, Local not specified - PPO | Admitting: Family Medicine

## 2012-08-28 VITALS — BP 120/80 | Temp 98.7°F | Ht 65.5 in | Wt 226.0 lb

## 2012-08-28 DIAGNOSIS — Z0000 Encounter for general adult medical examination without abnormal findings: Secondary | ICD-10-CM

## 2012-08-28 LAB — POCT URINALYSIS DIPSTICK
Bilirubin, UA: NEGATIVE
Glucose, UA: NEGATIVE
Ketones, UA: NEGATIVE
Leukocytes, UA: NEGATIVE
Nitrite, UA: NEGATIVE
Protein, UA: NEGATIVE
Spec Grav, UA: 1.02
Urobilinogen, UA: 0.2
pH, UA: 7.5

## 2012-08-28 NOTE — Patient Instructions (Signed)
Continue current medications and followup at the neurology clinic at [facility]  Most importantly continue diet exercise and weight loss program.  Return in one year for general medical exam sooner if any problems

## 2012-08-28 NOTE — Progress Notes (Signed)
  Subjective:    Patient ID: Summer [name], female    DOB: 05/15/1951, 61 y.o.   MRN: [id number]  HPI Summer [name] is a 61 year old married female nonsmoker who comes in today for general physical examination  She has a history of trigeminal neuralgia and is followed at [facility] in neurology. She's currently on Carbatrol 300 mg twice a day, Cymbalta 90 mg daily, and states her symptoms have diminished but not completely gone.  She gets routine eye care, dental care, does not do BSE monthly because her breasts are so large. She is wanting to know about a reduction mammoplasty because of the large breasts and the grooves in her shoulders and the back pain. I referred her to Dr. Shon Hough plastic surgeon for consult  She gets routine mammography, colonoscopy 2 months ago was normal, tetanus 2010, declined a flu shot  Dear diet and exercise she's lost 30 pounds in the past 6 months she is down to 226. This will be helpful for her overall health plus she has osteoarthritis of both knees. She's recently been seen by orthopedics and has had some injections.   Review of Systems  Constitutional: Negative.   HENT: Negative.   Eyes: Negative.   Respiratory: Negative.   Cardiovascular: Negative.   Gastrointestinal: Negative.   Genitourinary: Negative.   Musculoskeletal: Negative.   Neurological: Negative.   Hematological: Negative.   Psychiatric/Behavioral: Negative.    she had her uterus removed at age 61 for nonmalignant reasons     Objective:   Physical Exam  Constitutional: She appears well-developed and well-nourished.  HENT:  Head: Normocephalic and atraumatic.  Right Ear: External ear normal.  Left Ear: External ear normal.  Nose: Nose normal.  Mouth/Throat: Oropharynx is clear and moist.  Eyes: EOM are normal. Pupils are equal, round, and reactive to light.  Neck: Normal range of motion. Neck supple. No thyromegaly present.  Cardiovascular: Normal rate, regular rhythm, normal  heart sounds and intact distal pulses.  Exam reveals no gallop and no friction rub.   No murmur heard. Pulmonary/Chest: Effort normal and breath sounds normal.  Abdominal: Soft. Bowel sounds are normal. She exhibits no distension and no mass. There is no tenderness. There is no rebound.  Musculoskeletal: Normal range of motion.  Lymphadenopathy:    She has no cervical adenopathy.  Neurological: She is alert. She has normal reflexes. No cranial nerve deficit. She exhibits normal muscle tone. Coordination normal.  Skin: Skin is warm and dry.       Skin exam normal except for scars midline of her neck from previous zoster outbreak  Psychiatric: She has a normal mood and affect. Her behavior is normal. Judgment and thought content normal.          Assessment & Plan:  Obesity continue diet exercise and weight loss  Trigeminal Summer [name] continue followup at [facility] neurology  Gynecomastia        general plastic surgery consult with Dr. Shon Hough

## 2012-09-02 ENCOUNTER — Encounter: Payer: Federal, State, Local not specified - PPO | Admitting: Family Medicine

## 2012-09-11 ENCOUNTER — Encounter: Payer: Self-pay | Admitting: Family Medicine

## 2012-09-11 ENCOUNTER — Ambulatory Visit (INDEPENDENT_AMBULATORY_CARE_PROVIDER_SITE_OTHER): Payer: Federal, State, Local not specified - PPO | Admitting: Family Medicine

## 2012-09-11 VITALS — BP 100/74 | HR 86 | Temp 98.1°F | Wt 228.0 lb

## 2012-09-11 DIAGNOSIS — J329 Chronic sinusitis, unspecified: Secondary | ICD-10-CM

## 2012-09-11 MED ORDER — AMOXICILLIN 875 MG PO TABS: 875.0000 mg | ORAL_TABLET | Freq: Two times a day (BID) | ORAL | Status: DC

## 2012-09-11 NOTE — Progress Notes (Signed)
Chief Complaint  Patient presents with  . Hoarse    sinus pressure x 2 weeks     HPI: -started: about 2-3 weeks ago - started to get better then got worse again -symptoms:nasal congestion, sore throat, cough, green mucus, harshness lots of drainage, pressure in sinuses -denies:fever, SOB, NVD, tooth pain, strep or mono exposure -has tried: musinec -sick contacts: none -Hx of: sinusitis   ROS: See pertinent positives and negatives per HPI.  Past Medical History  Diagnosis Date  . Diverticulosis   . History of shingles 2012--  no residual pain  . History of kidney stones   . Left ureteral calculus   . Trigeminal neuralgia RIGHT  . Arthritis shoulders and back  . 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 Urgency of urination   . History of kidney stones   . 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 Heart disease Maternal Grandmother   . Diabetes Mother   . Breast cancer Maternal Aunt   . Lupus Cousin   . Colon cancer Neg Hx   . Esophageal cancer Neg Hx   . Rectal cancer Neg Hx   . Stomach cancer Neg Hx     History   Social History  . Marital Status: Married    Spouse Name: N/A    Number of Children: 1  . Years of Education: N/A   Occupational History  .     Social History Main Topics  . Smoking status: Never Smoker   . Smokeless tobacco: Never Used  . Alcohol Use: Yes     Comment: RARE  . Drug Use: No  . Sexually Active: None   Other Topics Concern  . None   Social History Narrative  . None    Current outpatient prescriptions:carbamazepine (CARBATROL) 300 MG 12 hr capsule, Take 300 mg by mouth 2 (two) times daily. , Disp: , Rfl: ;  docusate sodium (COLACE) 250 MG capsule, Take 400 mg by mouth daily., Disp: , Rfl: ;  DULoxetine (CYMBALTA) 30 MG capsule, Take 90 mg by mouth daily. , Disp: , Rfl: ;  amoxicillin (AMOXIL) 875 MG tablet, Take 1 tablet (875 mg total) by mouth 2 (two) times daily., Disp:  20 tablet, Rfl: 0  EXAM:  Filed Vitals:   09/11/12 1021  BP: 100/74  Pulse: 86  Temp: 98.1 F (36.7 C)    There is no height on file to calculate BMI.  GENERAL: vitals reviewed and listed above, alert, oriented, appears well hydrated and in no acute distress  HEENT: atraumatic, conjunttiva clear, no obvious abnormalities on inspection of external nose and ears, white nasal congestioin, PND, post oropharyngeal cobblestoning  NECK: no obvious masses on inspection  LUNGS: clear to auscultation bilaterally, no wheezes, rales or rhonchi, good air movement  CV: HRRR, no peripheral edema  MS: moves all extremities without noticeable abnormality  PSYCH: pleasant and cooperative, no obvious depression or anxiety  ASSESSMENT AND PLAN:  Discussed the following assessment and plan:  1. Sinusitis  amoxicillin (AMOXIL) 875 MG tablet   -discussed potential of possible sinusitis (viral versus bacterial), discussed risks/benefits of abx - will start amoxicillin and supportive care -Patient advised to return or notify a doctor immediately if symptoms worsen or persist or new concerns arise.  Patient Instructions  INSTRUCTIONS FOR UPPER RESPIRATORY INFECTION:  --As we discussed, we have prescribed a new medication (AMOXICILLIN) for you at this appointment. We discussed the  common and serious potential adverse effects of this medication and you can review these and more with the pharmacist when you pick up your medication.  Please follow the instructions for use carefully and notify us immediately if you have any problems taking this medication.  -plenty of rest and fluids  -nasal saline wash 2-3 times daily (use prepackaged nasal saline or bottled/distilled water if making your own)   -can use sinex nasal spray for drainage and nasal congestion - but do NOT use longer then 3-4 days  -can use tylenol or ibuprofen as directed for aches and sorethroat  -in the winter time, using a  humidifier at night is helpful (please follow cleaning instructions)  -if you are taking a cough medication - use only as directed, may also try a teaspoon of honey to coat the throat and throat lozenges  -for sore throat, salt water gargles can help  -follow up if you have fevers, facial pain, tooth pain, difficulty breathing or are worsening or not getting better in 5-7 days      KIM, HANNAH R.

## 2012-09-11 NOTE — Patient Instructions (Signed)
INSTRUCTIONS FOR UPPER RESPIRATORY INFECTION:  --As we discussed, we have prescribed a new medication (AMOXICILLIN) for you at this appointment. We discussed the common and serious potential adverse effects of this medication and you can review these and more with the pharmacist when you pick up your medication.  Please follow the instructions for use carefully and notify us immediately if you have any problems taking this medication.  -plenty of rest and fluids  -nasal saline wash 2-3 times daily (use prepackaged nasal saline or bottled/distilled water if making your own)   -can use sinex nasal spray for drainage and nasal congestion - but do NOT use longer then 3-4 days  -can use tylenol or ibuprofen as directed for aches and sorethroat  -in the winter time, using a humidifier at night is helpful (please follow cleaning instructions)  -if you are taking a cough medication - use only as directed, may also try a teaspoon of honey to coat the throat and throat lozenges  -for sore throat, salt water gargles can help  -follow up if you have fevers, facial pain, tooth pain, difficulty breathing or are worsening or not getting better in 5-7 days  

## 2012-10-25 ENCOUNTER — Other Ambulatory Visit: Payer: Self-pay

## 2012-11-03 ENCOUNTER — Telehealth: Payer: Self-pay | Admitting: Family Medicine

## 2012-11-03 DIAGNOSIS — G500 Trigeminal neuralgia: Secondary | ICD-10-CM

## 2012-11-03 NOTE — Telephone Encounter (Signed)
okay

## 2012-11-03 NOTE — Telephone Encounter (Signed)
Pt requesting referral for trigeminal neuralgia to Dr. Vickey Huger at [facility].

## 2012-11-24 ENCOUNTER — Other Ambulatory Visit: Payer: Self-pay | Admitting: Neurology

## 2012-11-24 DIAGNOSIS — R51 Headache: Secondary | ICD-10-CM

## 2012-11-27 ENCOUNTER — Ambulatory Visit (INDEPENDENT_AMBULATORY_CARE_PROVIDER_SITE_OTHER): Payer: Federal, State, Local not specified - PPO

## 2012-11-27 ENCOUNTER — Other Ambulatory Visit: Payer: Self-pay

## 2012-11-27 DIAGNOSIS — R51 Headache: Secondary | ICD-10-CM

## 2012-11-27 NOTE — Procedures (Signed)
[facility]  NEUROIMAGING REPORT   STUDY DATE: 11/27/12 PATIENT NAME: Summer [name] DOB: 1951-03-23 MRN: [id number]  ORDERING CLINICIAN: Dr Terrace Arabia CLINICAL HISTORY: 61 year patient been you relative for right-sided facial pain  EXAM: MRI Brain w/wo and thin brainstem cuts TECHNIQUE: MRI of the brain with and without contrast was obtained utilizing 5 mm axial slices with T1, T2, T2 flair, T2 star gradient echo and diffusion weighted views.  T1 sagittal, T2 coronal and postcontrast views in the axial and coronal plane were obtained.Thin 3 mm axial and coronal sections were obtained thru brainstem pre and post contrast in addition as well. CONTRAST:20 ml Magnevist IMAGING SITE:[facility] FINDINGS:  No abnormal lesions are seen on diffusion-weighted views to suggest acute ischemia. The cortical sulci, fissures and cisterns are normal in size and appearance. Lateral, third and fourth ventricle are normal in size and appearance. No extra-axial fluid collections are seen. No evidence of mass effect or midline shift. There are tiny bifrontal as well as slightly larger by atrial nonspecific periventricular white matter hyperintensities which may be seen in a variety of conditions including small vessel disease, vasculitis, demyelinating, autoimmune or infectious conditions. None of these involved the corpus callosum, brain stem or cerebellum. These appears slightly more advanced compared with previous MRI scan dated/06/2006. No abnormal lesions are seen on post contrast views.  On sagittal views the posterior fossa, pituitary gland and corpus callosum are unremarkable. No evidence of intracranial hemorrhage on gradient-echo views. The orbits and their contents, paranasal sinuses and calvarium are unremarkable.  Intracranial flow voids are present. Thin sections through the brainstem show normal appearance of structures without any abnormal vascular lesion noted. The trigeminal  nerves appear unremarkable.  IMPRESSION:  This MRI scan of the brain shows tiny nonspecific subcortical and periventricular white matter hyperintensities with the differential discussed above. These appears slightly progressed compared with previous MRI scan dated 12/18/2005. No enhancing lesions are noted.   INTERPRETING PHYSICIAN:  Delia Heady, MD Certified in  Neuroimaging bu [facility], [facility]  [facility] [address], [address] [place], [place] [address] [phone]

## 2012-11-28 MED ORDER — GADOPENTETATE DIMEGLUMINE 469.01 MG/ML IV SOLN: 20.0000 mL | Freq: Once | INTRAVENOUS | Status: AC | PRN

## 2012-11-28 NOTE — Progress Notes (Signed)
Quick Note:  Please call pt for normal MRI brain result. ______

## 2012-12-01 ENCOUNTER — Telehealth: Payer: Self-pay

## 2012-12-01 MED ORDER — OXCARBAZEPINE ER 300 MG PO TB24: 300.0000 mg | ORAL_TABLET | Freq: Every day | ORAL | Status: DC

## 2012-12-01 NOTE — Telephone Encounter (Signed)
Patient calling wants to no if she should be taking Tegretol and oxtellar 300 mg . Together ? Please advise. If Dr.Yan wants me to keep taking oxtellar 300 mg I will needs rx called in.

## 2012-12-01 NOTE — Telephone Encounter (Signed)
I have called in oxellar xr, please let patient know.

## 2012-12-02 NOTE — Telephone Encounter (Signed)
I have called her about medications, don't take tegretol and oxtellar together, she only took oxtellar 300mg  qhs, sometimes is better.

## 2012-12-02 NOTE — Telephone Encounter (Signed)
Can she take oxtellar and the tegretol together ? Please advise

## 2013-01-14 ENCOUNTER — Telehealth: Payer: Self-pay | Admitting: Neurology

## 2013-01-20 ENCOUNTER — Ambulatory Visit: Payer: Self-pay | Admitting: Neurology

## 2013-01-29 ENCOUNTER — Encounter: Payer: Self-pay | Admitting: Neurology

## 2013-01-29 DIAGNOSIS — R51 Headache: Secondary | ICD-10-CM

## 2013-01-30 ENCOUNTER — Ambulatory Visit (INDEPENDENT_AMBULATORY_CARE_PROVIDER_SITE_OTHER): Payer: Federal, State, Local not specified - PPO | Admitting: Neurology

## 2013-01-30 ENCOUNTER — Encounter: Payer: Self-pay | Admitting: Neurology

## 2013-01-30 VITALS — BP 145/72 | HR 74 | Ht 66.0 in | Wt 241.0 lb

## 2013-01-30 DIAGNOSIS — M792 Neuralgia and neuritis, unspecified: Secondary | ICD-10-CM

## 2013-01-30 DIAGNOSIS — IMO0002 Reserved for concepts with insufficient information to code with codable children: Secondary | ICD-10-CM

## 2013-01-30 DIAGNOSIS — R930 Abnormal findings on diagnostic imaging of skull and head, not elsewhere classified: Secondary | ICD-10-CM

## 2013-01-30 MED ORDER — OXCARBAZEPINE ER 300 MG PO TB24: 600.0000 mg | ORAL_TABLET | Freq: Every day | ORAL | Status: DC

## 2013-01-30 NOTE — Progress Notes (Signed)
  History of Present Illness  HPI: Summer [name] is a 62 years old right-handed African American female, referred by her primary care physician Dr. Kelle Darting for evaluation of right facial pain  She was diagnosed with right trigeminal neuralgia in 2006, presenting with severe electronics shooting pain at her right upper molar, she had microvascular decompression surgery at [facility] by Dr. Leanord Asal around 2006, which has helped the severe pain, but she continued to have annoying almost constant pain at her right face, she describes hot cold sensitivity over right face, touching of right forehead region will also set of her right facial pain, she felt right tongue swelling, she bites on her right tongue sometimes, she works at customer service, on the phone to hours each day, with prolonged talking, she felt sandpaper was rubbing against her right tongue, also complains of right upper and lower lip numbness, intermittent electronics shooting pain,  Over the years, she has tried different medication, was evaluated by our office Dr. Vickey Huger in 2008, she has tried gabapentin, Lyrica, Cymbalta, Trileptal, currently is taking Tegretol 200 mg twice a day, initially it helps her some, but failed to help work consistently,  She denied visual change, recent few months of left lower back, left hip pain, radiating to left posterior thigh, to her left leg and left foot, difficulty walking after prolonged sitting  UPDATE May 23rd 2014: She complains of left low back pain, left hip pain, radiating pain to left foot, like walking on a dead, burning sensation, she continued to complains of right facial constant achy pain, there was no significant change after started on Oxitellar xR 300 mg every day, no significant side effects,  I have reviewed MRI, mild small vessel disease, no significant abnormality otherwise, She is going on a trip to [place] in November 2014.  Physical Exam  Neck: supple no carotid  bruits Respiratory: clear to auscultation bilaterally Cardiovascular: regular rate rhythm  Neurologic Exam  Mental Status: pleasant, awake, alert, cooperative to history, talking, and casual conversation. Cranial Nerves: CN II-XII pupils were equal round reactive to light.  Fundi were sharp bilaterally.  Extraocular movements were full.  Visual fields were full on confrontational test. cornea reflexes were normal and symmetric. Facial sensation and strength were normal.  Hearing was intact to finger rubbing bilaterally.  Uvula tongue were midline.  Head turning and shoulder shrugging were normal and symmetric.  Tongue protrusion into the cheeks strength were normal.  Motor: Normal tone, bulk, and strength. Sensory: Normal to light touch, pinprick, proprioception, and vibratory sensation. with exception of decreased light touch at right upper and lower lip Coordination: Normal finger-to-nose, heel-to-shin.  There was no dysmetria noticed. Gait and Station: Narrow based and steady, was able to perform tiptoe, heel, and tandem walking, mild atalgic  Romberg sign: Negative Reflexes: Deep tendon reflexes: Biceps: 2/2, Brachioradialis: 2/2, Triceps: 2/2, Pateller: 2/2, Achilles: 1 /1.  Plantar responses are flexor.   Assessment and Plan: 62 years old [place] American female, with long-standing history of right facial pain, consistent with right trigeminal neuralgia, had a history of microvascular decompression surgery, but with persistent symptoms, has triedmultiple different medications in the past, including Tegretol, Trileptal, Neurontin, Lyrica, Cymbalta,  1, will  Increase oxtellar to 300mg  2 tabs qhs. 2. Low back pain suggested left L5 radiculopathy, she decided to hold off further evaluation at this point 3 return to clinic in 3 months.

## 2013-03-02 ENCOUNTER — Telehealth: Payer: Self-pay | Admitting: *Deleted

## 2013-03-02 DIAGNOSIS — M549 Dorsalgia, unspecified: Secondary | ICD-10-CM

## 2013-03-02 NOTE — Telephone Encounter (Signed)
Patient called stating her back pain is getting worse every day and she would like to have a MRI done. Patient would like to speak with physician.

## 2013-03-02 NOTE — Telephone Encounter (Signed)
I have called her left message for her, ordered MRI lumbar

## 2013-03-18 ENCOUNTER — Ambulatory Visit (INDEPENDENT_AMBULATORY_CARE_PROVIDER_SITE_OTHER): Payer: Federal, State, Local not specified - PPO

## 2013-03-18 DIAGNOSIS — M549 Dorsalgia, unspecified: Secondary | ICD-10-CM

## 2013-03-20 ENCOUNTER — Telehealth: Payer: Self-pay | Admitting: Neurology

## 2013-03-20 DIAGNOSIS — M549 Dorsalgia, unspecified: Secondary | ICD-10-CM

## 2013-03-20 NOTE — Telephone Encounter (Signed)
I have called her. MRI lumbar showed  Abnormal MRI lumbar spine (without) demonstrating: 1. At L5-S1: facet hypertrophy with moderate right and severe left foraminal stenosis  2. At L4-5: pseudo disc bulging and facet hypertrophy with moderate right and mild left foraminal stenosis. There is 2 mm anterior spondylolisthesis of L4 on L5.  3. Degenerative spondylosis and disc bulging L1-2 and L4-5.  I will refer her to [facility] clinic

## 2013-05-25 ENCOUNTER — Encounter: Payer: Self-pay | Admitting: Neurology

## 2013-05-25 ENCOUNTER — Ambulatory Visit (INDEPENDENT_AMBULATORY_CARE_PROVIDER_SITE_OTHER): Payer: Federal, State, Local not specified - PPO | Admitting: Neurology

## 2013-05-25 VITALS — BP 135/71 | HR 96 | Ht 65.0 in | Wt 251.0 lb

## 2013-05-25 DIAGNOSIS — M1990 Unspecified osteoarthritis, unspecified site: Secondary | ICD-10-CM

## 2013-05-25 MED ORDER — GABAPENTIN 300 MG PO CAPS: 300.0000 mg | ORAL_CAPSULE | Freq: Three times a day (TID) | ORAL | Status: DC

## 2013-05-25 NOTE — Progress Notes (Signed)
History of Present Illness:  Summer [name] is a 62 years old right-handed African American female, referred by her primary care physician Dr. Kelle Darting for evaluation of right facial pain  She was diagnosed with right trigeminal neuralgia in 2006, presenting with severe electronics shooting pain at her right upper molar, she had microvascular decompression surgery at [facility] by Dr. Leanord Asal around 2006, which has helped the severe pain, but she continued to have annoying almost constant pain at her right face, she describes hot cold sensitivity over right face, touching of right forehead region will also set of her right facial pain, she felt right tongue swelling, she bites on her right tongue sometimes, she works at customer service, on the phone many hours each day, with prolonged talking, she felt sandpaper was rubbing against her right tongue, also complains of right upper and lower lip numbness, intermittent electronics shooting pain,  Over the years, she has tried different medication, was evaluated by our office Dr. Vickey Huger in 2008, she has tried gabapentin, Lyrica, Cymbalta, Trileptal, currently is taking Tegretol 200 mg twice a day, initially it helps her some, but failed to help work consistently,  She denied visual change, recent few months of left lower back, left hip pain, radiating to left posterior thigh, to her left leg and left foot, difficulty walking after prolonged sitting   She complains of left low back pain, left hip pain, radiating pain to left foot, like walking on a dead, burning sensation, she continued to complains of right facial constant achy pain, there was no significant change after started on Oxitellar xR 300 mg every day, no significant side effects,  I have reviewed MRI, mild small vessel disease, no significant abnormality otherwise,  She had MRI lumbar spine (without) demonstrating: L5-S1: facet hypertrophy with moderate right and severe left foraminal  stenosis  L4-5: pseudo disc bulging and facet hypertrophy with moderate right and mild left foraminal stenosis. There is 2 mm anterior spondylolisthesis of L4 on L5.  Degenerative spondylosis and disc bulging L1-2 and L4-5.  UPDATE Sept 15th 2014: She feel sandpaper of her right tongue, she noticed mild right tooth ache at right side, could not tell at right upper or lower tooth, she feels like mild achiness at left lower molar.  It was bad enough to keep her awake sometimes, it has spread to her right mid-tongue, she felt right tongue swollen.  She denies dysarthria, is taking oxtellar xr 300mg  2 tabs qhs.  She still complains of low back pain, radiating pain to left leg. She has no incontinence, she has left foot numbness, no weakness, going to Dr. Modesto Charon for potential epidural injection.   Physical Exam  Neck: supple no carotid bruits Respiratory: clear to auscultation bilaterally Cardiovascular: regular rate rhythm  Neurologic Exam  Mental Status: pleasant, awake, alert, cooperative to history, talking, and casual conversation. Cranial Nerves: CN II-XII pupils were equal round reactive to light.  Fundi were sharp bilaterally.  Extraocular movements were full.  Visual fields were full on confrontational test. cornea reflexes were normal and symmetric. Facial sensation and strength were normal.  Hearing was intact to finger rubbing bilaterally.  Uvula tongue were midline.  Head turning and shoulder shrugging were normal and symmetric.  Tongue protrusion into the cheeks strength were normal.  Motor: Normal tone, bulk, and strength, no weakness at left leg, including left ankle dorsiflexion, plantar flexion. Sensory: Normal to light touch, pinprick, proprioception, and vibratory sensation. with exception of decreased light touch at  right upper and lower lip Coordination: Normal finger-to-nose, heel-to-shin.  There was no dysmetria noticed. Gait and Station: Narrow based and steady, was able to  perform tiptoe, heel, and tandem walking, mild atalgic  Romberg sign: Negative Reflexes: Deep tendon reflexes: Biceps: 2/2, Brachioradialis: 2/2, Triceps: 2/2, Pateller: 2/2, Achilles: 1 /1.  Plantar responses are flexor.   Assessment and Plan: 62 years old [place] American female, with long-standing history of right facial pain, consistent with right trigeminal neuralgia, had a history of microvascular decompression surgery, but with persistent symptoms, has triedmultiple different medications in the past, including Tegretol, Trileptal, Neurontin, Lyrica, Cymbalta,  1, will  Increase oxtellar to 300mg  2 tabs qhs, add on Gabapentin 300mg  tid, she is also taking cymbalta 30mg  iii qhs. 2. Continue left lumbar radicuopathy management by Dr. Modesto Charon 3.  return to clinic in 6 month with Summer [name]

## 2013-06-05 ENCOUNTER — Other Ambulatory Visit (INDEPENDENT_AMBULATORY_CARE_PROVIDER_SITE_OTHER): Payer: Self-pay

## 2013-06-05 DIAGNOSIS — Z0289 Encounter for other administrative examinations: Secondary | ICD-10-CM

## 2013-06-05 NOTE — Addendum Note (Signed)
Addended byHermenia Fiscal on: 06/05/2013 01:13 PM   Modules accepted: Orders

## 2013-06-06 LAB — FOLATE: Folate: 9.8 ng/mL (ref >3.0)

## 2013-06-10 NOTE — Progress Notes (Signed)
Quick Note:  Left message on cell with normal labs, per Dr. Terrace Arabia. ______

## 2013-07-16 ENCOUNTER — Other Ambulatory Visit: Payer: Self-pay

## 2013-08-28 ENCOUNTER — Other Ambulatory Visit: Payer: Self-pay | Admitting: Family Medicine

## 2013-09-26 ENCOUNTER — Other Ambulatory Visit: Payer: Self-pay | Admitting: Neurology

## 2013-10-01 ENCOUNTER — Other Ambulatory Visit (INDEPENDENT_AMBULATORY_CARE_PROVIDER_SITE_OTHER): Payer: Federal, State, Local not specified - PPO

## 2013-10-01 DIAGNOSIS — Z0000 Encounter for general adult medical examination without abnormal findings: Secondary | ICD-10-CM

## 2013-10-01 LAB — POCT URINALYSIS DIPSTICK
Bilirubin, UA: NEGATIVE
Glucose, UA: NEGATIVE
Ketones, UA: NEGATIVE
Leukocytes, UA: NEGATIVE
Nitrite, UA: NEGATIVE
Protein, UA: NEGATIVE
Spec Grav, UA: 1.02
Urobilinogen, UA: 0.2
pH, UA: 7

## 2013-10-01 LAB — CBC WITH DIFFERENTIAL/PLATELET
Basophils Absolute: 0 10*3/uL (ref 0.0–0.1)
Basophils Relative: 0.6 % (ref 0.0–3.0)
Eosinophils Absolute: 0.2 10*3/uL (ref 0.0–0.7)
Eosinophils Relative: 2.9 % (ref 0.0–5.0)
HCT: 39 % (ref 36.0–46.0)
Hemoglobin: 12.6 g/dL (ref 12.0–15.0)
Lymphocytes Relative: 33.8 % (ref 12.0–46.0)
Lymphs Abs: 2.1 10*3/uL (ref 0.7–4.0)
MCHC: 32.3 g/dL (ref 30.0–36.0)
MCV: 87.2 fl (ref 78.0–100.0)
Monocytes Absolute: 0.5 10*3/uL (ref 0.1–1.0)
Monocytes Relative: 8.1 % (ref 3.0–12.0)
Neutro Abs: 3.4 10*3/uL (ref 1.4–7.7)
Neutrophils Relative %: 54.6 % (ref 43.0–77.0)
Platelets: 346 10*3/uL (ref 150.0–400.0)
RBC: 4.48 Mil/uL (ref 3.87–5.11)
RDW: 13.7 % (ref 11.5–14.6)
WBC: 6.2 10*3/uL (ref 4.5–10.5)

## 2013-10-01 LAB — HEPATIC FUNCTION PANEL
ALT: 14 U/L (ref 0–35)
AST: 15 U/L (ref 0–37)
Albumin: 3.5 g/dL (ref 3.5–5.2)
Alkaline Phosphatase: 107 U/L (ref 39–117)
Bilirubin, Direct: 0 mg/dL (ref 0.0–0.3)
Total Bilirubin: 0.4 mg/dL (ref 0.3–1.2)
Total Protein: 7.2 g/dL (ref 6.0–8.3)

## 2013-10-01 LAB — BASIC METABOLIC PANEL
BUN: 10 mg/dL (ref 6–23)
CO2: 29 mEq/L (ref 19–32)
Calcium: 8.8 mg/dL (ref 8.4–10.5)
Chloride: 105 mEq/L (ref 96–112)
Creatinine, Ser: 0.7 mg/dL (ref 0.4–1.2)
GFR: 102.79 mL/min (ref >60.00)
Glucose, Bld: 109 mg/dL — ABNORMAL HIGH (ref 70–99)
Potassium: 3.7 mEq/L (ref 3.5–5.1)
Sodium: 142 mEq/L (ref 135–145)

## 2013-10-01 LAB — LDL CHOLESTEROL, DIRECT: Direct LDL: 141.1 mg/dL

## 2013-10-01 LAB — LIPID PANEL
Cholesterol: 224 mg/dL — ABNORMAL HIGH (ref 0–200)
HDL: 64.1 mg/dL (ref >39.00)
Total CHOL/HDL Ratio: 3
Triglycerides: 82 mg/dL (ref 0.0–149.0)
VLDL: 16.4 mg/dL (ref 0.0–40.0)

## 2013-10-01 LAB — TSH: TSH: 0.95 u[IU]/mL (ref 0.35–5.50)

## 2013-10-06 ENCOUNTER — Encounter: Payer: Self-pay | Admitting: Family Medicine

## 2013-10-06 ENCOUNTER — Ambulatory Visit (INDEPENDENT_AMBULATORY_CARE_PROVIDER_SITE_OTHER): Payer: Federal, State, Local not specified - PPO | Admitting: Family Medicine

## 2013-10-06 VITALS — BP 130/90 | Temp 98.6°F | Ht 65.75 in | Wt 270.0 lb

## 2013-10-06 DIAGNOSIS — M797 Fibromyalgia: Secondary | ICD-10-CM

## 2013-10-06 DIAGNOSIS — Z87448 Personal history of other diseases of urinary system: Secondary | ICD-10-CM

## 2013-10-06 DIAGNOSIS — M1990 Unspecified osteoarthritis, unspecified site: Secondary | ICD-10-CM

## 2013-10-06 DIAGNOSIS — M549 Dorsalgia, unspecified: Secondary | ICD-10-CM

## 2013-10-06 DIAGNOSIS — J309 Allergic rhinitis, unspecified: Secondary | ICD-10-CM

## 2013-10-06 DIAGNOSIS — IMO0002 Reserved for concepts with insufficient information to code with codable children: Secondary | ICD-10-CM

## 2013-10-06 DIAGNOSIS — M792 Neuralgia and neuritis, unspecified: Secondary | ICD-10-CM

## 2013-10-06 DIAGNOSIS — IMO0001 Reserved for inherently not codable concepts without codable children: Secondary | ICD-10-CM

## 2013-10-06 DIAGNOSIS — E669 Obesity, unspecified: Secondary | ICD-10-CM

## 2013-10-06 MED ORDER — HYDROCHLOROTHIAZIDE 12.5 MG PO TABS: ORAL_TABLET | ORAL | Status: DC

## 2013-10-06 NOTE — Progress Notes (Signed)
   Subjective:    Patient ID: Summer [name], female    DOB: 06/22/1951, 63 y.o.   MRN: [id number]  HPI Summer [name] is a 63 year old married female nonsmoker who works at the [employer] who comes in today for general physical examination  She has a history of fibromyalgia and has been treated with Cymbalta by her rheumatologist. She also has chronic pain and is on Neurontin 300 mg 3 times daily Mobic 15 mg daily and another drug or never heard of  She gets routine eye care, dental care, BSE monthly, last mammogram 2012, colonoscopy good for 10 years  Tetanus booster 2010 she declines a flu shot. Generalized. In her 27s Pap was 2013 normal therefore the your current recommendations every 3 years and she's asymptomatic.  Her weight is 270 pounds. She says she doesn't have time to diet or exercise she works too much  She also wants a reduction mammoplasty. Her breasts are very large she has grooves in her shoulders and chronic back pain from the breast size. We'll refer her to Dr. Towanda Malkin for consult   Review of Systems  Constitutional: Negative.   HENT: Negative.   Eyes: Negative.   Respiratory: Negative.   Cardiovascular: Negative.   Gastrointestinal: Negative.   Genitourinary: Negative.   Musculoskeletal: Negative.   Neurological: Negative.   Psychiatric/Behavioral: Negative.        Objective:   Physical Exam  Nursing note and vitals reviewed. Constitutional: She appears well-developed and well-nourished.  HENT:  Head: Normocephalic and atraumatic.  Right Ear: External ear normal.  Left Ear: External ear normal.  Nose: Nose normal.  Mouth/Throat: Oropharynx is clear and moist.  Eyes: EOM are normal. Pupils are equal, round, and reactive to light.  Neck: Normal range of motion. Neck supple. No thyromegaly present.  Cardiovascular: Normal rate, regular rhythm, normal heart sounds and intact distal pulses.  Exam reveals no gallop and no friction rub.   No murmur  heard. No carotid or he bruits peripheral pulses 1+ and symmetrical  Pulmonary/Chest: Effort normal and breath sounds normal.  Abdominal: Soft. Bowel sounds are normal. She exhibits no distension and no mass. There is no tenderness. There is no rebound.  Genitourinary:  Bilateral breast exam shows that the breasts in the sitting position or so large that the rest on her anterior thighs. In the supine position I can palpate no abnormalities.  Musculoskeletal: Normal range of motion.  Lymphadenopathy:    She has no cervical adenopathy.  Neurological: She is alert. She has normal reflexes. No cranial nerve deficit. She exhibits normal muscle tone. Coordination normal.  Skin: Skin is warm and dry.  Psychiatric: She has a normal mood and affect. Her behavior is normal. Judgment and thought content normal.          Assessment & Plan:  Healthy female  Fibromyalgia continue followup by rheumatology  Chronic pain continue followup by neurology  Obesity recommend diet exercise and weight loss  Bilateral breast enlargement,,,,,,,,, with back pain grooves in her shoulders..... refer to Dr. Towanda Malkin for consult

## 2013-10-06 NOTE — Patient Instructions (Signed)
The plastic surgeon we recommend is Dr. Cristine Polio  Add the diuretic........ one tablet daily in the morning  Continue your current meds  Followup in 1 year sooner if any problems  Work heart and the next 12 months to lose 20 pounds

## 2013-10-06 NOTE — Progress Notes (Signed)
Pre visit review using our clinic review tool, if applicable. No additional management support is needed unless otherwise documented below in the visit note. 

## 2013-11-26 ENCOUNTER — Ambulatory Visit: Payer: Federal, State, Local not specified - PPO | Admitting: Nurse Practitioner

## 2013-11-26 ENCOUNTER — Other Ambulatory Visit: Payer: Self-pay | Admitting: Family Medicine

## 2014-02-23 ENCOUNTER — Other Ambulatory Visit: Payer: Self-pay | Admitting: Family Medicine

## 2014-05-03 ENCOUNTER — Ambulatory Visit (INDEPENDENT_AMBULATORY_CARE_PROVIDER_SITE_OTHER): Payer: Federal, State, Local not specified - PPO | Admitting: Family Medicine

## 2014-05-03 ENCOUNTER — Encounter: Payer: Self-pay | Admitting: Family Medicine

## 2014-05-03 ENCOUNTER — Ambulatory Visit: Payer: Federal, State, Local not specified - PPO | Admitting: Family Medicine

## 2014-05-03 VITALS — BP 139/77 | HR 91 | Temp 98.8°F | Ht 65.75 in | Wt 265.0 lb

## 2014-05-03 DIAGNOSIS — R079 Chest pain, unspecified: Secondary | ICD-10-CM

## 2014-05-03 DIAGNOSIS — M940 Chondrocostal junction syndrome [Tietze]: Secondary | ICD-10-CM

## 2014-05-03 MED ORDER — PREDNISONE 10 MG PO TABS
ORAL_TABLET | ORAL | Status: DC
Start: 2014-05-03 — End: 2014-09-30

## 2014-05-03 NOTE — Progress Notes (Signed)
   Subjective:    Patient ID: Summer [name], female    DOB: 08/17/1951, 63 y.o.   MRN: [id number]  HPI Here for intermittent sharp pains in the chest that started one month ago. These are not associated with exercise. She denies any SOB although taking a deep breath or twisting her trunk makes the pain worse. No sweats or nausea. No GERD sx. She takes Mobic every 2-3 days but not daily.    Review of Systems  Constitutional: Negative.   Respiratory: Negative.   Cardiovascular: Positive for chest pain. Negative for palpitations and leg swelling.       Objective:   Physical Exam  Constitutional: She appears well-developed and well-nourished. No distress.  Cardiovascular: Normal rate, regular rhythm, normal heart sounds and intact distal pulses.   EKG normal   Pulmonary/Chest: Effort normal and breath sounds normal.  She is tender over both sternal margins           Assessment & Plan:  This is costochondritis. Reassured her this is benign. Given a steroid taper pack. Recheck prn

## 2014-05-03 NOTE — Progress Notes (Signed)
Pre visit review using our clinic review tool, if applicable. No additional management support is needed unless otherwise documented below in the visit note. 

## 2014-05-04 DIAGNOSIS — M940 Chondrocostal junction syndrome [Tietze]: Secondary | ICD-10-CM

## 2014-05-04 HISTORY — DX: Chondrocostal junction syndrome (tietze): M94.0

## 2014-06-04 ENCOUNTER — Other Ambulatory Visit: Payer: Self-pay | Admitting: Neurology

## 2014-06-21 ENCOUNTER — Other Ambulatory Visit: Payer: Self-pay | Admitting: Neurology

## 2014-07-17 ENCOUNTER — Other Ambulatory Visit: Payer: Self-pay | Admitting: Neurology

## 2014-08-13 ENCOUNTER — Other Ambulatory Visit: Payer: Self-pay | Admitting: Family Medicine

## 2014-08-16 ENCOUNTER — Telehealth: Payer: Self-pay | Admitting: Neurology

## 2014-08-16 MED ORDER — GABAPENTIN 300 MG PO CAPS: 300.0000 mg | ORAL_CAPSULE | Freq: Three times a day (TID) | ORAL | Status: DC

## 2014-08-16 NOTE — Telephone Encounter (Signed)
Patient requesting Rx for gabapentin (NEURONTIN) 300 MG capsule refill to last until scheduled appointment on January 21,2016.  Please call and advise.

## 2014-08-16 NOTE — Telephone Encounter (Signed)
Rx has been sent.  I called back, got no answer.  Left message. 

## 2014-09-30 ENCOUNTER — Other Ambulatory Visit: Payer: Self-pay | Admitting: *Deleted

## 2014-09-30 ENCOUNTER — Ambulatory Visit (INDEPENDENT_AMBULATORY_CARE_PROVIDER_SITE_OTHER): Payer: Federal, State, Local not specified - PPO | Admitting: Neurology

## 2014-09-30 ENCOUNTER — Encounter: Payer: Self-pay | Admitting: Neurology

## 2014-09-30 ENCOUNTER — Other Ambulatory Visit (INDEPENDENT_AMBULATORY_CARE_PROVIDER_SITE_OTHER): Payer: Federal, State, Local not specified - PPO

## 2014-09-30 VITALS — Ht 65.0 in | Wt 255.0 lb

## 2014-09-30 DIAGNOSIS — Z0000 Encounter for general adult medical examination without abnormal findings: Secondary | ICD-10-CM

## 2014-09-30 DIAGNOSIS — M792 Neuralgia and neuritis, unspecified: Secondary | ICD-10-CM

## 2014-09-30 DIAGNOSIS — G4730 Sleep apnea, unspecified: Secondary | ICD-10-CM

## 2014-09-30 LAB — COMPREHENSIVE METABOLIC PANEL
ALT: 13 U/L (ref 0–35)
AST: 15 U/L (ref 0–37)
Albumin: 3.6 g/dL (ref 3.5–5.2)
Alkaline Phosphatase: 120 U/L — ABNORMAL HIGH (ref 39–117)
BUN: 10 mg/dL (ref 6–23)
CO2: 31 mEq/L (ref 19–32)
Calcium: 9.1 mg/dL (ref 8.4–10.5)
Chloride: 103 mEq/L (ref 96–112)
Creatinine, Ser: 0.74 mg/dL (ref 0.40–1.20)
GFR: 102.45 mL/min (ref >60.00)
Glucose, Bld: 117 mg/dL — ABNORMAL HIGH (ref 70–99)
Potassium: 3.9 mEq/L (ref 3.5–5.1)
Sodium: 140 mEq/L (ref 135–145)
Total Bilirubin: 0.5 mg/dL (ref 0.2–1.2)
Total Protein: 6.8 g/dL (ref 6.0–8.3)

## 2014-09-30 LAB — CBC WITH DIFFERENTIAL/PLATELET
Basophils Absolute: 0.1 10*3/uL (ref 0.0–0.1)
Basophils Relative: 0.7 % (ref 0.0–3.0)
Eosinophils Absolute: 0.1 10*3/uL (ref 0.0–0.7)
Eosinophils Relative: 1.6 % (ref 0.0–5.0)
HCT: 37.5 % (ref 36.0–46.0)
Hemoglobin: 12.2 g/dL (ref 12.0–15.0)
Lymphocytes Relative: 29.2 % (ref 12.0–46.0)
Lymphs Abs: 2.2 10*3/uL (ref 0.7–4.0)
MCHC: 32.5 g/dL (ref 30.0–36.0)
MCV: 82.7 fl (ref 78.0–100.0)
Monocytes Absolute: 0.6 10*3/uL (ref 0.1–1.0)
Monocytes Relative: 7.9 % (ref 3.0–12.0)
Neutro Abs: 4.5 10*3/uL (ref 1.4–7.7)
Neutrophils Relative %: 60.6 % (ref 43.0–77.0)
Platelets: 404 10*3/uL — ABNORMAL HIGH (ref 150.0–400.0)
RBC: 4.54 Mil/uL (ref 3.87–5.11)
RDW: 15.4 % (ref 11.5–15.5)
WBC: 7.4 10*3/uL (ref 4.0–10.5)

## 2014-09-30 LAB — LIPID PANEL
Cholesterol: 202 mg/dL — ABNORMAL HIGH (ref 0–200)
HDL: 57.6 mg/dL (ref >39.00)
LDL Cholesterol: 127 mg/dL — ABNORMAL HIGH (ref 0–99)
NonHDL: 144.4
Total CHOL/HDL Ratio: 4
Triglycerides: 85 mg/dL (ref 0.0–149.0)
VLDL: 17 mg/dL (ref 0.0–40.0)

## 2014-09-30 LAB — TSH: TSH: 1.5 u[IU]/mL (ref 0.35–4.50)

## 2014-09-30 MED ORDER — GABAPENTIN 300 MG PO CAPS: 300.0000 mg | ORAL_CAPSULE | Freq: Three times a day (TID) | ORAL | Status: DC

## 2014-09-30 MED ORDER — DULOXETINE HCL 30 MG PO CPEP: 90.0000 mg | ORAL_CAPSULE | Freq: Every day | ORAL | Status: DC

## 2014-09-30 NOTE — Progress Notes (Signed)
History of Present Illness:  Summer [name] is a 65 years old right-handed African American female, referred by her primary care physician Dr. Stevie Kern for evaluation of right facial pain  She was diagnosed with right trigeminal neuralgia in 2006, presenting with severe electronics shooting pain at her right upper molar, she had microvascular decompression surgery at [facility] by Dr. Harrison Mons around 2006, which has helped the severe pain, but she continued to have annoying almost constant pain at her right face, she describes hot cold sensitivity over right face, touching of right forehead region will also set of her right facial pain, she felt right tongue swelling, she bites on her right tongue sometimes, she works at customer service, on the phone many hours each day, with prolonged talking, she felt sandpaper was rubbing against her right tongue, also complains of right upper and lower lip numbness, intermittent electronics shooting pain,  Over the years, she has tried different medication, was evaluated by our office Dr. Brett Fairy in 2008, she has tried gabapentin, Lyrica, Cymbalta, Trileptal, currently is taking Tegretol 200 mg twice a day, initially it helps her some, but failed to help work consistently,  She denied visual change, recent few months of left lower back, left hip pain, radiating to left posterior thigh, to her left leg and left foot, difficulty walking after prolonged sitting   She complains of left low back pain, left hip pain, radiating pain to left foot, like walking on a dead, burning sensation, she continued to complains of right facial constant achy pain, there was no significant change after started on Oxitellar xR 300 mg every day, no significant side effects,  I have reviewed MRI, mild small vessel disease, no significant abnormality otherwise,  She had MRI lumbar spine (without) demonstrating: L5-S1: facet hypertrophy with moderate right and severe left foraminal  stenosis  L4-5: pseudo disc bulging and facet hypertrophy with moderate right and mild left foraminal stenosis. There is 2 mm anterior spondylolisthesis of L4 on L5.  Degenerative spondylosis and disc bulging L1-2 and L4-5.  UPDATE Sept 15th 2014: She feel sandpaper of her right tongue, she noticed mild right tooth ache at right side, could not tell at right upper or lower tooth, she feels like mild achiness at left lower molar.  It was bad enough to keep her awake sometimes, it has spread to her right mid-tongue, she felt right tongue swollen.  She denies dysarthria, is taking oxtellar xr 300mg  2 tabs qhs.  She still complains of low back pain, radiating pain to left leg. She has no incontinence, she has left foot numbness, no weakness, going to Dr. Jacelyn Grip for potential epidural injection.   UPDATE Sep 30 2014: She felt sand paper sensation in her right tongue, flashing sensation, last 1-2 weeks, only take gabapentin 300mg  tid now. She also complains of snoring, frequent wakening during the nighttime, excessive sleepiness at daytime, F ESS score is 37, ESS is 9   Physical Exam  Neck: supple no carotid bruits Respiratory: clear to auscultation bilaterally Cardiovascular: regular rate rhythm  Neurologic Exam  Mental Status: pleasant, awake, alert, cooperative to history, talking, and casual conversation. Cranial Nerves: CN II-XII pupils were equal round reactive to light.  Fundi were sharp bilaterally.  Extraocular movements were full.  Visual fields were full on confrontational test. cornea reflexes were normal and symmetric. Facial sensation and strength were normal.  Hearing was intact to finger rubbing bilaterally.  Uvula tongue were midline.  Head turning and shoulder shrugging  were normal and symmetric.  Tongue protrusion into the cheeks strength were normal.  Motor: Normal tone, bulk, and strength, no weakness at left leg, including left ankle dorsiflexion, plantar flexion. Sensory:  Normal to light touch, pinprick, proprioception, and vibratory sensation. with exception of decreased light touch at right upper and lower lip Coordination: Normal finger-to-nose, heel-to-shin.  There was no dysmetria noticed. Gait and Station: Narrow based and steady, was able to perform tiptoe, heel, and tandem walking, mild atalgic  Romberg sign: Negative Reflexes: Deep tendon reflexes: Biceps: 2/2, Brachioradialis: 2/2, Triceps: 2/2, Pateller: 2/2, Achilles: 1 /1.  Plantar responses are flexor.   Assessment and Plan: 64 years old [place] American female, with long-standing history of right facial pain, consistent with right trigeminal neuralgia, had a history of microvascular decompression surgery, but with persistent symptoms, has triedmultiple different medications in the past, including Tegretol, Trileptal, Neurontin, Lyrica, Cymbalta,  1, refill her Gabapentin 300mg  tid,   cymbalta 30mg  iii qhs. 2. She has symptoms suggestive of obstructive sleep apnea, refer her to sleep study, ESS is 61, F S S is 37  Orders Placed This Encounter  Procedures  . Ambulatory referral to Sleep Studies     Return in about 1 year (around 10/01/2015). Summer [name], M.D. Ph.D.  [facility] [place], [place] [address] Phone: [phone] Fax:      [phone]

## 2014-10-04 ENCOUNTER — Telehealth: Payer: Self-pay | Admitting: Neurology

## 2014-10-04 DIAGNOSIS — R0683 Snoring: Secondary | ICD-10-CM

## 2014-10-04 DIAGNOSIS — G4710 Hypersomnia, unspecified: Secondary | ICD-10-CM

## 2014-10-04 DIAGNOSIS — G4730 Sleep apnea, unspecified: Secondary | ICD-10-CM

## 2014-10-04 DIAGNOSIS — R351 Nocturia: Secondary | ICD-10-CM

## 2014-10-04 NOTE — Telephone Encounter (Signed)
Dr. Marcial Pacas, refers patient for attended sleep study.  Height: 5'5"  Weight: 255 lb  BMI: 42.43  Past Medical History:  Diverticulosis    . History of shingles 2012-- no residual pain  . History of kidney stones   . Left ureteral calculus   . Trigeminal neuralgia RIGHT  . Arthritis shoulders and back  . 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 Urgency of urination   . History of kidney stones   . 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       DULoxetine HCl (Cap DR Particles) CYMBALTA 30 MG Take 3 capsules (90 mg total) by mouth daily.      Docusate Sodium (Cap) COLACE 250 MG Take 400 mg by mouth daily.      Gabapentin (Cap) NEURONTIN 300 MG Take 1 capsule (300 mg total) by mouth 3 (three) times daily.      Hydrochlorothiazide (Tab) HYDRODIURIL 12.5 MG 1 by mouth every morning      .         Ins: [employer]   Assessment & Plan:  64 years old [place] American female, with long-standing history of right facial pain, consistent with right trigeminal neuralgia, had a history of microvascular decompression surgery, but with persistent symptoms, has triedmultiple different medications in the past, including Tegretol, Trileptal, Neurontin, Lyrica, Cymbalta,  1, refill her Gabapentin 300mg  tid, cymbalta 30mg  iii qhs. 2. She has symptoms suggestive of obstructive sleep apnea, refer her to sleep study, ESS is 37, F S S is 37  Please review patient information and submit instructions for scheduling and orders for sleep technologist. Thank you.

## 2014-10-04 NOTE — Telephone Encounter (Signed)
Dr Krista Blue - PCP Dellis Filbert Todd,/MD Patient does not need assistance or device?  Morbid obesity, BMI over 40 , headaches and atypical facial pain, EDS and high Fatigue. Needs Co2. SPLIT

## 2014-10-07 ENCOUNTER — Encounter: Payer: Self-pay | Admitting: Family Medicine

## 2014-10-07 ENCOUNTER — Ambulatory Visit (INDEPENDENT_AMBULATORY_CARE_PROVIDER_SITE_OTHER): Payer: Federal, State, Local not specified - PPO | Admitting: Family Medicine

## 2014-10-07 VITALS — BP 140/78 | Temp 98.6°F | Ht 65.0 in | Wt 263.0 lb

## 2014-10-07 DIAGNOSIS — Z0000 Encounter for general adult medical examination without abnormal findings: Secondary | ICD-10-CM

## 2014-10-07 DIAGNOSIS — E669 Obesity, unspecified: Secondary | ICD-10-CM

## 2014-10-07 DIAGNOSIS — M797 Fibromyalgia: Secondary | ICD-10-CM

## 2014-10-07 MED ORDER — CYCLOBENZAPRINE HCL 10 MG PO TABS: 10.0000 mg | ORAL_TABLET | Freq: Every day | ORAL | Status: DC

## 2014-10-07 MED ORDER — HYDROCHLOROTHIAZIDE 12.5 MG PO TABS: ORAL_TABLET | ORAL | Status: DC

## 2014-10-07 NOTE — Progress Notes (Signed)
Pre visit review using our clinic review tool, if applicable. No additional management support is needed unless otherwise documented below in the visit note. 

## 2014-10-07 NOTE — Progress Notes (Signed)
   Subjective:    Patient ID: Summer [name], female    DOB: 15-Apr-1951, 64 y.o.   MRN: [id number]  HPI Summer [name] is a 64 year old female nonsmoker who comes in today for general physical examination because of some healthcare problems  She has fibromyalgia his followed by rheumatology and neurology. She is on Neurontin 300 mg 3 times daily Cymbalta 30 mg daily. She also takes Hydrocort thiazide 25 mg or 12.5 mg daily when necessary for fluid retention and Flexeril 10 mg at bedtime  She gets routine eye care, dental care, last colonoscopy was 2013, last mammogram was 2012. She says she does like to get a mammogram today smasher but obese. Told her that haven't mammogram is a whole lot less painful than having breast cancer. She also had a history of vaginal malignancy is not seen her GYN in many years. Referred her back to GYN for follow-up.  Vaccinations declines a flu shot  She does not exercise on a regular basis and refuses to do so weight 263 pounds   Review of Systems  Constitutional: Negative.   HENT: Negative.   Eyes: Negative.   Respiratory: Negative.   Cardiovascular: Negative.   Gastrointestinal: Negative.   Endocrine: Negative.   Genitourinary: Negative.   Musculoskeletal: Negative.   Skin: Negative.   Allergic/Immunologic: Negative.   Neurological: Negative.   Hematological: Negative.   Psychiatric/Behavioral: Negative.        Objective:   Physical Exam  Constitutional: She appears well-developed and well-nourished.  HENT:  Head: Normocephalic and atraumatic.  Right Ear: External ear normal.  Left Ear: External ear normal.  Nose: Nose normal.  Mouth/Throat: Oropharynx is clear and moist.  Eyes: EOM are normal. Pupils are equal, round, and reactive to light.  Neck: Normal range of motion. Neck supple. No JVD present. No tracheal deviation present. No thyromegaly present.  Cardiovascular: Normal rate, regular rhythm, normal heart sounds and intact distal  pulses.  Exam reveals no gallop and no friction rub.   No murmur heard. Pulmonary/Chest: Effort normal and breath sounds normal. No stridor. No respiratory distress. She has no wheezes. She has no rales. She exhibits no tenderness.  Abdominal: Soft. Bowel sounds are normal. She exhibits no distension and no mass. There is no tenderness. There is no rebound and no guarding.  Genitourinary:  Bilateral breast exam normal  Musculoskeletal: Normal range of motion.  Lymphadenopathy:    She has no cervical adenopathy.  Neurological: She is alert. She has normal reflexes. No cranial nerve deficit. She exhibits normal muscle tone. Coordination normal.  Skin: Skin is warm and dry. No rash noted. No erythema. No pallor.  Psychiatric: She has a normal mood and affect. Her behavior is normal. Judgment and thought content normal.  Nursing note and vitals reviewed.         Assessment & Plan:  Fibromyalgia continue follow-up by rheumatology and neurology  Fluid retention hydrochlorothiazide 12.5 mg daily  Obesity........... again as we've done for many years in the past prescribed diet exercise and weight loss which she has declined to do  History of GYN malignancy referred back to GYN  No mammogram in 4 years,,,,,,,, advise yearly mammographic screening

## 2014-10-07 NOTE — Patient Instructions (Signed)
Call and make an appointment for GYN follow-up  Call and get set up for your annual mammogram  Continue current medications  Follow-up in one year sooner if any problems

## 2014-11-15 ENCOUNTER — Ambulatory Visit (INDEPENDENT_AMBULATORY_CARE_PROVIDER_SITE_OTHER): Payer: Federal, State, Local not specified - PPO | Admitting: Family Medicine

## 2014-11-15 ENCOUNTER — Encounter: Payer: Self-pay | Admitting: Family Medicine

## 2014-11-15 VITALS — BP 116/80 | HR 104 | Temp 98.6°F | Wt 258.0 lb

## 2014-11-15 DIAGNOSIS — K5732 Diverticulitis of large intestine without perforation or abscess without bleeding: Secondary | ICD-10-CM

## 2014-11-15 MED ORDER — METRONIDAZOLE 500 MG PO TABS
ORAL_TABLET | ORAL | Status: DC
Start: 2014-11-15 — End: 2015-10-06

## 2014-11-15 MED ORDER — CIPROFLOXACIN HCL 250 MG PO TABS: 250.0000 mg | ORAL_TABLET | Freq: Two times a day (BID) | ORAL | Status: DC

## 2014-11-15 NOTE — Progress Notes (Signed)
   Subjective:    Patient ID: [name], female    DOB: 1951/09/10, 64 y.o.   MRN: [id number]  HPI Summer [name] is a 64 year old female who comes in today for evaluation of abdominal pain constipation and change in bowel habits with possible rectal bleeding  She's had a history of diverticulitis and has had part of her colon removed. She's also had a history of intermittent constipation. Over the weekend she became constipated took some laxatives had a bowel movement that appeared bloody and then the next bowel appeared black. She has no fever chills or vomiting. She also complaints of abdominal pain with it.  Last colonoscopy 2004  Review of Systems Review of systems otherwise negative except for chronic back pain    Objective:   Physical Exam  Well-developed well-nourished female no acute distress vital signs stable she's afebrile examination the abdomen the abdomen is soft bowel sounds are normal liver spleen kidneys not enlarged no palpable masses no tenderness no rebound. Rectal negative stool guaiac negative      Assessment & Plan:  Suspect a flare of her diverticulitis with possible intermittent GI bleed,,,,,, because of her history of a partial colectomy,,,,,,, we'll treat aggressively with liquids Cipro Flagyl stool softeners and laxatives and GI consult ASAP

## 2014-11-15 NOTE — Patient Instructions (Addendum)
MiraLAX one twice daily  Full liquid diet...Marland KitchenMarland KitchenMarland Kitchen except fruits fiber.  Milk of magnesia,,,,, 3 tablespoons twice daily  Cipro and Flagyl,,,,,,, one of each twice daily  I will get you set up a consult in GI ASAP  If in the meantime between now and your consult with GI if your pain gets worse or you develop fever or any bleeding go directly to [facility] long emergency room

## 2014-11-15 NOTE — Progress Notes (Signed)
Pre visit review using our clinic review tool, if applicable. No additional management support is needed unless otherwise documented below in the visit note. 

## 2014-11-17 ENCOUNTER — Telehealth: Payer: Self-pay | Admitting: Family Medicine

## 2014-11-17 NOTE — Telephone Encounter (Signed)
Pt called to ask if she should continue to take the medicine that was rx to her when she was here on 11/15/14. Pt said she has an appt with the GI doctor on 11/23/14  Pt would like a call back please.Marland Kitchen

## 2014-11-18 NOTE — Telephone Encounter (Signed)
Patient should continue medication per Dr Sherren Mocha. Left message on machine returning patient's call.

## 2014-11-23 ENCOUNTER — Encounter: Payer: Self-pay | Admitting: Physician Assistant

## 2014-11-23 ENCOUNTER — Ambulatory Visit (INDEPENDENT_AMBULATORY_CARE_PROVIDER_SITE_OTHER): Payer: Federal, State, Local not specified - PPO | Admitting: Physician Assistant

## 2014-11-23 ENCOUNTER — Other Ambulatory Visit (INDEPENDENT_AMBULATORY_CARE_PROVIDER_SITE_OTHER): Payer: Federal, State, Local not specified - PPO

## 2014-11-23 ENCOUNTER — Ambulatory Visit (INDEPENDENT_AMBULATORY_CARE_PROVIDER_SITE_OTHER)
Admission: RE | Admit: 2014-11-23 | Discharge: 2014-11-23 | Disposition: A | Discharge status: Home / Self Care | Payer: Federal, State, Local not specified - PPO | Source: Ambulatory Visit | Attending: Physician Assistant | Admitting: Physician Assistant

## 2014-11-23 VITALS — BP 128/76 | HR 80 | Ht 65.0 in | Wt 255.6 lb

## 2014-11-23 DIAGNOSIS — R1011 Right upper quadrant pain: Secondary | ICD-10-CM

## 2014-11-23 DIAGNOSIS — Z8719 Personal history of other diseases of the digestive system: Secondary | ICD-10-CM

## 2014-11-23 DIAGNOSIS — R1012 Left upper quadrant pain: Secondary | ICD-10-CM

## 2014-11-23 DIAGNOSIS — K5732 Diverticulitis of large intestine without perforation or abscess without bleeding: Secondary | ICD-10-CM

## 2014-11-23 LAB — BASIC METABOLIC PANEL
BUN: 14 mg/dL (ref 6–23)
CO2: 32 mEq/L (ref 19–32)
Calcium: 9.6 mg/dL (ref 8.4–10.5)
Chloride: 103 mEq/L (ref 96–112)
Creatinine, Ser: 0.84 mg/dL (ref 0.40–1.20)
GFR: 88.46 mL/min (ref >60.00)
Glucose, Bld: 113 mg/dL — ABNORMAL HIGH (ref 70–99)
Potassium: 3.9 mEq/L (ref 3.5–5.1)
Sodium: 139 mEq/L (ref 135–145)

## 2014-11-23 LAB — CBC WITH DIFFERENTIAL/PLATELET
Basophils Absolute: 0 10*3/uL (ref 0.0–0.1)
Basophils Relative: 0.2 % (ref 0.0–3.0)
Eosinophils Absolute: 0.2 10*3/uL (ref 0.0–0.7)
Eosinophils Relative: 1.8 % (ref 0.0–5.0)
HCT: 38.5 % (ref 36.0–46.0)
Hemoglobin: 12.5 g/dL (ref 12.0–15.0)
Lymphocytes Relative: 18.3 % (ref 12.0–46.0)
Lymphs Abs: 2 10*3/uL (ref 0.7–4.0)
MCHC: 32.5 g/dL (ref 30.0–36.0)
MCV: 81.8 fl (ref 78.0–100.0)
Monocytes Absolute: 0.7 10*3/uL (ref 0.1–1.0)
Monocytes Relative: 6.7 % (ref 3.0–12.0)
Neutro Abs: 8.1 10*3/uL — ABNORMAL HIGH (ref 1.4–7.7)
Neutrophils Relative %: 73 % (ref 43.0–77.0)
Platelets: 398 10*3/uL (ref 150.0–400.0)
RBC: 4.7 Mil/uL (ref 3.87–5.11)
RDW: 15.8 % — ABNORMAL HIGH (ref 11.5–15.5)
WBC: 11.1 10*3/uL — ABNORMAL HIGH (ref 4.0–10.5)

## 2014-11-23 MED ORDER — IOHEXOL 300 MG/ML  SOLN
100.0000 mL | Freq: Once | INTRAMUSCULAR | Status: AC | PRN
  Administered 2014-11-23: 100 mL via INTRAVENOUS

## 2014-11-23 NOTE — Progress Notes (Signed)
Patient ID: Summer [name], female   DOB: 1951-07-08, 64 y.o.   MRN: [id number]   Subjective:    Patient ID: Summer [name], female    DOB: 07-01-1951, 64 y.o.   MRN: [id number]  HPI  Summer [name] is a pleasant 64 year old African-American female known to Dr. Carlean [name]. She is being referred by Dr. Sherren [name] today for further evaluation of upper abdominal pain and questionable rectal bleeding.. Patient has history of complicated diverticular disease and is status post prior segmental colectomy for perforated diverticulitis. She also has history of obesity ureterolithiasis arthritis fibromyalgia and costochondritis. Last procedures were done in August 2013 with EGD normal and colonoscopy showing mild diverticulosis of the left colon she had an anastomosis in the left colon which was unremarkable. This was done because of concerns for melena. Patient says her current pain is been present for about 3 weeks and she describes pain in the right and left her upper quadrants radiating around into the back but more predominant on the right. Says she has been uncomfortable with movement and also wearing a bra. She thought she may have been constipated took a laxative and then noticed that her stools changed color and she saw some dark stool which she was concerned may have been blood. She has since been seen by Dr. Sherren [name] started on a course of Cipro and Flagyl for possible diverticulitis also started on MiraLAX daily as well as milk of magnesia. He says she's been eating very bland. She is having regular bowel movements and says her stools are actually mushy or loose at this point and still seem to be changing colors which is worrying her. He has not had any fever or chills, no nausea or vomiting in appetite is good. She currently feels that her discomfort is worse in the evenings. She does remember that she had a catching her grabbing-type pain recently after reaching over a file Noted at home and wonders if some of her  pain may be musculoskeletal. She had been taking a BC powder at nighttime but has discontinued that. Exline She says she admits that she gets very worried because of her prior complication with perforation.  Review of Systems Pertinent positive and negative review of systems were noted in the above HPI section.  All other review of systems was otherwise negative.  Outpatient Encounter Prescriptions as of 11/23/2014  Medication Sig  . ciprofloxacin (CIPRO) 250 MG tablet Take 1 tablet (250 mg total) by mouth 2 (two) times daily.  . cyclobenzaprine (FLEXERIL) 10 MG tablet Take 1 tablet (10 mg total) by mouth at bedtime.  . docusate sodium (COLACE) 250 MG capsule Take 400 mg by mouth daily.  . DULoxetine (CYMBALTA) 30 MG capsule Take 3 capsules (90 mg total) by mouth daily.  Marland Kitchen gabapentin (NEURONTIN) 300 MG capsule Take 1 capsule (300 mg total) by mouth 3 (three) times daily.  . hydrochlorothiazide (HYDRODIURIL) 12.5 MG tablet 1 by mouth every morning  . magnesium hydroxide (MILK OF MAGNESIA) 400 MG/5ML suspension Take 15 mLs by mouth daily as needed for mild constipation.  . metroNIDAZOLE (FLAGYL) 500 MG tablet One by mouth twice a day  . polyethylene glycol (MIRALAX / GLYCOLAX) packet Take 17 g by mouth 2 (two) times daily.   Allergies  Allergen Reactions  . Clindamycin Hcl Itching    itch   Patient Active Problem List   Diagnosis Date Noted  . Costochondritis 05/04/2014  . Neuralgia 11/30/2010  . Obesity 11/09/2010  . Fibromyalgia 11/09/2010  .  HEMATURIA, HX OF 11/14/2009  . LOC OSTEOARTHROS NOT SPEC PRIM/SEC OTH [facility] SITE 06/27/2009  . ALLERGIC RHINITIS 08/26/2008  . 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 Diverticulitis of colon 06/13/2007   History   Social History  . Marital Status: Married    Spouse Name: N/A  . Number of Children: 1  . Years of Education: College   Occupational History  .    [employer]  [employer]   Social History Main Topics    . Smoking status: Never Smoker   . Smokeless tobacco: Never Used  . Alcohol Use: Yes     Comment: RARE  . Drug Use: No  . Sexual Activity: Not on file   Other Topics Concern  . Not on file   Social History Narrative    Ms. Summer [name] family history includes Breast cancer in her maternal aunt; Diabetes in her mother; Heart disease in her maternal grandmother; Lupus in her cousin. There is no history of Colon cancer, Esophageal cancer, Rectal cancer, or Stomach cancer.      Objective:    Filed Vitals:   11/23/14 0838  BP: 128/76  Pulse: 80    Physical Exam  well-developed older African-American female in no acute distress, pleasant blood pressure 128/76 pulse 80 height 5 foot 5 weight 255. HEENT; nontraumatic normocephalic EOMI PERRLA sclera anicteric Supple; no JVD, Cardiovascular ;regular rate and rhythm with S1-S2 no murmur or gallop, Pulmonary; clear bilaterally, Abdomen; obese soft and basically nontender no palpable mass or hepatosplenomegaly, bowel sounds are present she does have lower abdominal incisional scar and scar from previous colostomy. Palpation over the costal margin patient quite tender on the right and around into the right lower posterior ribs, Rectal; exam stool brown and Hemoccult negative scant amount, Extremities; no clubbing cyanosis or edema skin warm and dry, Psych; mood and affect appropriate       Assessment & Plan:   #1 64 yo female with hx of perforated diverticulitis s/p segmental colectomy  With 3 week hx of upper abdominal pain radiating around into her back worse on right She is completing a course of Cipro/Flagyl for possible diverticulitis.  I do not think she has/had diverticulitis  - suspect chronic constipation and musculoskeletal  /costochrondritis as cause for her pain. #2 Fibromyalgia #3 osteoarthritis #4 Morbid obesity   Plan; CT abd/pelvis to r/o resolving diverticulitis, vs other intraabdominal inflammatory process. If negative-stop  ABX Advised moist heat Flexeril in evenings which she has an rx for  Trial of Salon pas patch for M-S pain. CBC, BMET today  Summer Genia Harold PA-C 11/23/2014   Cc: Dorena Cookey, MD

## 2014-11-23 NOTE — Patient Instructions (Signed)
Please go to the basement level to have your labs drawn.  Take Flexeril in the evening daily. Try Salon Pas patches on the Right side for pain. Take Benefiber daily.  Take Miralax, 1 dose ( 17 grams) daily or every other day.   You have been scheduled for a CT scan of the abdomen and pelvis at [place] ([address]---this is in the same building as Press [facility]).   You are scheduled on  11-23-2014 at 1:30 PM . You should arrive AT 1:15 pm   to your appointment time for registration. Please follow the written instructions below on the day of your exam:  WARNING: IF YOU ARE ALLERGIC TO IODINE/X-RAY DYE, PLEASE NOTIFY RADIOLOGY IMMEDIATELY AT [phone]! YOU WILL BE GIVEN A 13 HOUR PREMEDICATION PREP.  1) Do not eat or drink anything after 9:30 AM (4 hours prior to your test) 2) You have been given 2 bottles of oral contrast to drink. The solution may taste better if refrigerated, but do NOT add ice or any other liquid to this solution. Shake  well before drinking.    Drink 1 bottle of contrast @ 11:30 AM (2 hours prior to your exam)  Drink 1 bottle of contrast @ 12:30 pm  (1 hour prior to your exam)  You may take any medications as prescribed with a small amount of water except for the following: Metformin, Glucophage, Glucovance, Avandamet, Riomet, Fortamet, Actoplus Met, Janumet, Glumetza or Metaglip. The above medications must be held the day of the exam AND 48 hours after the exam.  The purpose of you drinking the oral contrast is to aid in the visualization of your intestinal tract. The contrast solution may cause some diarrhea. Before your exam is started, you will be given a small amount of fluid to drink. Depending on your individual set of symptoms, you may also receive an intravenous injection of x-ray contrast/dye. Plan on being at [facility] for 30 minutes or long, depending on the type of exam you are having performed.  If you have any questions  regarding your exam or if you need to reschedule, you may call the CT department at [phone] between the hours of 8:00 am and 5:00 pm, Monday-Friday.  ________________________________________________________________________

## 2014-11-24 NOTE — Progress Notes (Signed)
Agree with Ms. Esterwood's assessment and plan. Carl E. Gessner, MD, FACG   

## 2014-11-24 NOTE — Progress Notes (Signed)
Quick Note:  I do not think we need to work up the liver changes with MR at this time ______

## 2015-01-21 ENCOUNTER — Telehealth: Payer: Self-pay | Admitting: *Deleted

## 2015-01-21 NOTE — Telephone Encounter (Signed)
Left message on machine for patient to call back with results of last mammogram.

## 2015-02-02 ENCOUNTER — Other Ambulatory Visit: Payer: Self-pay | Admitting: Neurology

## 2015-10-03 ENCOUNTER — Ambulatory Visit: Payer: Federal, State, Local not specified - PPO | Admitting: Nurse Practitioner

## 2015-10-06 ENCOUNTER — Encounter: Payer: Self-pay | Admitting: Nurse Practitioner

## 2015-10-06 ENCOUNTER — Ambulatory Visit (INDEPENDENT_AMBULATORY_CARE_PROVIDER_SITE_OTHER): Payer: Federal, State, Local not specified - PPO | Admitting: Nurse Practitioner

## 2015-10-06 VITALS — BP 128/75 | HR 95 | Ht 66.0 in | Wt 258.8 lb

## 2015-10-06 DIAGNOSIS — R51 Headache: Secondary | ICD-10-CM | POA: Diagnosis not present

## 2015-10-06 DIAGNOSIS — R519 Headache, unspecified: Secondary | ICD-10-CM

## 2015-10-06 DIAGNOSIS — M792 Neuralgia and neuritis, unspecified: Secondary | ICD-10-CM

## 2015-10-06 HISTORY — DX: Headache, unspecified: R51.9

## 2015-10-06 MED ORDER — GABAPENTIN 300 MG PO CAPS: 300.0000 mg | ORAL_CAPSULE | Freq: Four times a day (QID) | ORAL | Status: DC

## 2015-10-06 NOTE — Progress Notes (Signed)
[facility]  PATIENT: [name] DOB: 02-Nov-1950   REASON FOR VISIT: Follow-up for right trigeminal neuralgia HISTORY FROM: Patient    HISTORY OF PRESENT ILLNESS: HISTORYMs. Andazola is a 65 years old right-handed African American female, referred by her primary care physician Dr. Stevie Kern for evaluation of right facial pain  She was diagnosed with right trigeminal neuralgia in 2006, presenting with severe electronics shooting pain at her right upper molar, she had microvascular decompression surgery at [facility] by Dr. Harrison Mons around 2006, which has helped the severe pain, but she continued to have annoying almost constant pain at her right face, she describes hot cold sensitivity over right face, touching of right forehead region will also set of her right facial pain, she felt right tongue swelling, she bites on her right tongue sometimes, she works at customer service, on the phone many hours each day, with prolonged talking, she felt sandpaper was rubbing against her right tongue, also complains of right upper and lower lip numbness, intermittent electronics shooting pain,  Over the years, she has tried different medication, was evaluated by our office Dr. Brett Fairy in 2008, she has tried gabapentin, Lyrica, Cymbalta, Trileptal, currently is taking Tegretol 200 mg twice a day, initially it helps her some, but failed to help work consistently, She denied visual change, recent few months of left lower back, left hip pain, radiating to left posterior thigh, to her left leg and left foot, difficulty walking after prolonged sitting She complains of left low back pain, left hip pain, radiating pain to left foot, like walking on a dead, burning sensation, she continued to complains of right facial constant achy pain, there was no significant change after started on Oxitellar xR 300 mg every day, no significant side effects,  I have reviewed MRI, mild small  vessel disease, no significant abnormality otherwise,  She had MRI lumbar spine (without) demonstrating: L5-S1: facet hypertrophy with moderate right and severe left foraminal stenosis  L4-5: pseudo disc bulging and facet hypertrophy with moderate right and mild left foraminal stenosis. There is 2 mm anterior spondylolisthesis of L4 on L5.  Degenerative spondylosis and disc bulging L1-2 and L4-5.  UPDATE Sep 30 2014: She felt sand paper sensation in her right tongue, flashing sensation, last 1-2 weeks, only take gabapentin 300mg  tid now. She also complains of snoring, frequent wakening during the nighttime, excessive sleepiness at daytime, F ESS score is 37, ESS is 9  UPDATE 10/06/2015 Ms. Biles, 65 year old female returns for follow-up. She has a history of right trigeminal neuralgia is currently on Neurontin 300 mg 3 times a day has breakthrough pain. 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 She occasionally will have upper and lower lip numbness. She returns for reevaluation   REVIEW OF SYSTEMS: Full 14 system review of systems performed and notable only for those listed, all others are neg:  Constitutional: Fatigue Cardiovascular: neg Ear/Nose/Throat: neg  Skin: neg Eyes: Blurred vision Respiratory: neg Gastroitestinal: neg  Hematology/Lymphatic: neg  Endocrine: Intolerance to cold Musculoskeletal:neg Allergy/Immunology: neg Neurological: neg Psychiatric: neg Sleep : neg   ALLERGIES: Allergies  Allergen Reactions  . Clindamycin Hcl Itching    itch    HOME MEDICATIONS: Outpatient Prescriptions Prior to Visit  Medication Sig Dispense Refill  . cyclobenzaprine (FLEXERIL) 10 MG tablet Take 1 tablet (10 mg total) by mouth at bedtime. 90 tablet 3  .  docusate sodium (COLACE) 250 MG capsule Take 400 mg by mouth daily.    Marland Kitchen gabapentin (NEURONTIN) 300 MG capsule Take 1 capsule (300 mg  total) by mouth 3 (three) times daily. 90 capsule 11  . magnesium hydroxide (MILK OF MAGNESIA) 400 MG/5ML suspension Take 15 mLs by mouth daily as needed for mild constipation.    . polyethylene glycol (MIRALAX / GLYCOLAX) packet Take 17 g by mouth 2 (two) times daily.    . DULoxetine (CYMBALTA) 30 MG capsule Take 3 capsules (90 mg total) by mouth daily. (Patient not taking: Reported on 10/06/2015) 90 capsule 11  . ciprofloxacin (CIPRO) 250 MG tablet Take 1 tablet (250 mg total) by mouth 2 (two) times daily. 20 tablet 0  . gabapentin (NEURONTIN) 300 MG capsule TAKE ONE CAPSULE BY MOUTH 3 TIMES A DAY 90 capsule 6  . hydrochlorothiazide (HYDRODIURIL) 12.5 MG tablet 1 by mouth every morning 90 tablet 3  . metroNIDAZOLE (FLAGYL) 500 MG tablet One by mouth twice a day 20 tablet 1   No facility-administered medications prior to visit.    PAST MEDICAL HISTORY: Past Medical History  Diagnosis Date  . Diverticulosis   . History of shingles 2012--  no residual pain  . History of kidney stones   . Left ureteral calculus   . Trigeminal neuralgia RIGHT  . Arthritis shoulders and back  . 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 Urgency of urination   . History of kidney stones   . 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 Fibromyalgia     PAST SURGICAL HISTORY: Past Surgical History  Procedure Laterality Date  . Wisdom tooth extraction    . Resection colostomy/ closure colostomy with coloproctostomy  02-20-2007  . Exploratory laparotomy/ resection mid to distal sigmoid and proximal rectum/ end proximal sigmoid colostomy  07-17-2006    PERFORATED DIVERTICULITIS WITH PERITONITIS  . Cerebral microvascular decompression  07-05-2006    RIGHT TRIGEMINAL NERVE  . Tonsillectomy  AS CHILD  . Vaginal hysterectomy  1998    Partial  . Ureteroscopy  07/04/2012    Procedure: URETEROSCOPY;  Surgeon: Claybon Jabs, MD;  [facility];  Service: Urology;  Laterality: Left;  .  Cystoscopy/retrograde/ureteroscopy/stone extraction with basket  07/04/2012    Procedure: CYSTOSCOPY/RETROGRADE/URETEROSCOPY/STONE EXTRACTION WITH BASKET;  Surgeon: Claybon Jabs, MD;  Location: [facility];  Service: Urology;  Laterality: Left;    FAMILY HISTORY: Family History  Problem Relation Age of Onset  . Heart disease Maternal Grandmother   . Diabetes Mother   . Breast cancer Maternal Aunt   . Lupus Cousin   . Colon cancer Neg Hx   . Esophageal cancer Neg Hx   . Rectal cancer Neg Hx   . Stomach cancer Neg Hx     SOCIAL HISTORY: Social History   Social History  . Marital Status: Married    Spouse Name: N/A  . Number of Children: 1  . Years of Education: College   Occupational History  .    [employer]  [employer]   Social History Main Topics  . Smoking status: Never Smoker   . Smokeless tobacco: Never Used  . Alcohol Use: Yes     Comment: RARE  . Drug Use: No  . Sexual Activity: Not on file   Other Topics Concern  . Not on file   Social History Narrative     PHYSICAL EXAM  Filed Vitals:   10/06/15 1424  BP: 128/75  Pulse: 95  Height: 5\' 6"  (1.676 m)  Weight: 258 lb 12.8 oz (117.391 kg)   Body mass index is 41.79 kg/(m^2).  General. Well-developed obese female in no acute distress Neck: supple no carotid bruits Respiratory: clear to auscultation bilaterally Cardiovascular: regular rate rhythm  Neurologic Exam  Mental Status: pleasant, awake, alert, cooperative to history, talking, and casual conversation. Cranial Nerves: CN II-XII pupils were equal round reactive to light. Fundi were sharp bilaterally. Extraocular movements were full. Visual fields were full on confrontational test. cornea reflexes were normal and symmetric. Facial sensation and strength were normal. Hearing was intact to finger rubbing bilaterally. Uvula tongue were midline. Head turning and shoulder shrugging were normal and symmetric. Tongue  protrusion into the cheeks strength were normal.  Motor: Normal tone, bulk, and strength,  Sensory: Normal to light touch, pinprick, proprioception, and vibratory sensation. with exception of decreased light touch at right upper and lower lip Coordination: Normal finger-to-nose, heel-to-shin. There was no dysmetria noticed. Gait and Station: Narrow based and steady, was able to perform tiptoe, heel, and tandem walking, mild atalgic Romberg sign: Negative Reflexes: Deep tendon reflexes: Biceps: 2/2, Brachioradialis: 2/2, Triceps: 2/2, Pateller: 2/2, Achilles: 1 /1. Plantar responses are flexor.   DIAGNOSTIC DATA (LABS, IMAGING, TESTING) -  ASSESSMENT AND PLAN  65 years old [place] American female, with long-standing history of right facial pain, consistent with right trigeminal neuralgia, had a history of microvascular decompression surgery, but with persistent symptoms, has tried multiple different medications in the past, including Tegretol, Trileptal, Neurontin, Lyrica, and Cymbalta.  Increase gabapentin 300 mg to 4 times daily, refilled for 1 year Given patient handout trigeminal neuralgia, avoid things that can trigger an attack like chewing on the unaffected side avoid touching her face avoid blast of cold or hot air Follow-up yearly and when necessary Dennie Bible, [facility], [facility], APRN  [facility] [address] [place], [place] [address] [phone]

## 2015-10-06 NOTE — Progress Notes (Signed)
I have reviewed and agreed above plan. 

## 2015-10-06 NOTE — Patient Instructions (Signed)
Increase gabapentin 300 mg to 4 times daily, refilled for 1 year Follow-up yearly and when necessary

## 2015-10-27 ENCOUNTER — Ambulatory Visit (INDEPENDENT_AMBULATORY_CARE_PROVIDER_SITE_OTHER): Payer: Federal, State, Local not specified - PPO | Admitting: Internal Medicine

## 2015-10-27 ENCOUNTER — Encounter: Payer: Self-pay | Admitting: Internal Medicine

## 2015-10-27 ENCOUNTER — Other Ambulatory Visit (INDEPENDENT_AMBULATORY_CARE_PROVIDER_SITE_OTHER): Payer: Federal, State, Local not specified - PPO

## 2015-10-27 VITALS — BP 128/68 | HR 90 | Temp 98.4°F | Resp 12 | Ht 66.0 in | Wt 260.4 lb

## 2015-10-27 DIAGNOSIS — Z23 Encounter for immunization: Secondary | ICD-10-CM | POA: Diagnosis not present

## 2015-10-27 DIAGNOSIS — Z299 Encounter for prophylactic measures, unspecified: Secondary | ICD-10-CM

## 2015-10-27 DIAGNOSIS — Z0000 Encounter for general adult medical examination without abnormal findings: Secondary | ICD-10-CM | POA: Insufficient documentation

## 2015-10-27 LAB — COMPREHENSIVE METABOLIC PANEL
ALT: 9 U/L (ref 0–35)
AST: 13 U/L (ref 0–37)
Albumin: 4 g/dL (ref 3.5–5.2)
Alkaline Phosphatase: 99 U/L (ref 39–117)
BUN: 13 mg/dL (ref 6–23)
CO2: 30 mEq/L (ref 19–32)
Calcium: 9.2 mg/dL (ref 8.4–10.5)
Chloride: 105 mEq/L (ref 96–112)
Creatinine, Ser: 0.68 mg/dL (ref 0.40–1.20)
GFR: 112.55 mL/min (ref >60.00)
Glucose, Bld: 109 mg/dL — ABNORMAL HIGH (ref 70–99)
Potassium: 4 mEq/L (ref 3.5–5.1)
Sodium: 142 mEq/L (ref 135–145)
Total Bilirubin: 0.4 mg/dL (ref 0.2–1.2)
Total Protein: 7.5 g/dL (ref 6.0–8.3)

## 2015-10-27 LAB — LIPID PANEL
Cholesterol: 210 mg/dL — ABNORMAL HIGH (ref 0–200)
HDL: 65.8 mg/dL (ref >39.00)
LDL Cholesterol: 133 mg/dL — ABNORMAL HIGH (ref 0–99)
NonHDL: 144.15
Total CHOL/HDL Ratio: 3
Triglycerides: 58 mg/dL (ref 0.0–149.0)
VLDL: 11.6 mg/dL (ref 0.0–40.0)

## 2015-10-27 LAB — HEMOGLOBIN A1C: Hgb A1c MFr Bld: 6.1 % (ref 4.6–6.5)

## 2015-10-27 NOTE — Progress Notes (Signed)
   Subjective:    Patient ID: Summer [name], female    DOB: 1951/08/08, 65 y.o.   MRN: [id number]  HPI The patient is a 65 YO female coming in for wellness. Mild concern of some side pain, she was out in the yard doing work the day before it started. Has taken rare advil but does not like to take medicines. Denies urinary symptoms.   PMH, [facility], social history reviewed and updated.   Review of Systems  Constitutional: Negative for fever, activity change, appetite change, fatigue and unexpected weight change.  HENT: Negative.   Eyes: Negative.   Respiratory: Negative for cough, chest tightness, shortness of breath and wheezing.   Cardiovascular: Negative for chest pain, palpitations and leg swelling.  Gastrointestinal: Negative for nausea, abdominal pain, diarrhea, constipation and abdominal distention.  Genitourinary: Negative.   Musculoskeletal: Positive for myalgias and arthralgias. Negative for back pain and gait problem.  Skin: Negative.   Neurological: Positive for numbness. Negative for dizziness, syncope, weakness, light-headedness and headaches.  Psychiatric/Behavioral: Negative.       Objective:   Physical Exam  Constitutional: She is oriented to person, place, and time. She appears well-developed and well-nourished.  Overweight  HENT:  Head: Normocephalic and atraumatic.  Eyes: EOM are normal.  Neck: Normal range of motion.  Cardiovascular: Normal rate and regular rhythm.   Carotids without bruit  Pulmonary/Chest: Effort normal and breath sounds normal. No respiratory distress. She has no wheezes. She has no rales.  Abdominal: Soft. Bowel sounds are normal. She exhibits no distension. There is no tenderness.  Musculoskeletal:  Mild paraspinal lumbar pain to palpation, worse with twisting.  Neurological: She is alert and oriented to person, place, and time. A cranial nerve deficit is present.  Has numbness on her face s/p trigeminal neuralgia surgery.  Skin: Skin is  warm and dry.  Psychiatric: She has a normal mood and affect.   Filed Vitals:   10/27/15 1012  BP: 128/68  Pulse: 90  Temp: 98.4 F (36.9 C)  TempSrc: Oral  Resp: 12  Height: 5\' 6"  (1.676 m)  Weight: 260 lb 6.4 oz (118.117 kg)  SpO2: 97%      Assessment & Plan:

## 2015-10-27 NOTE — Assessment & Plan Note (Signed)
Shingles shot given at visit. She declines flu shot. Reminded about mammogram and need for pap smear. She will think about doing that but does not really want to do mammogram due to pain. Talked to her about diet and exercise.

## 2015-10-27 NOTE — Addendum Note (Signed)
Addended by: Resa Miner R on: 10/27/2015 02:10 PM   Modules accepted: Orders

## 2015-10-27 NOTE — Assessment & Plan Note (Signed)
Checking lipid, HgA1c, she will work on more exercise and recommended water aerobic as easier on her joints.

## 2015-10-27 NOTE — Progress Notes (Signed)
Pre visit review using our clinic review tool, if applicable. No additional management support is needed unless otherwise documented below in the visit note. 

## 2015-10-27 NOTE — Patient Instructions (Signed)
We will check the labs today and the hepatitis C screening and HIV. We will call you back with the results.   We have given you the shingles shot today.  You can use that cream on your side pain to help it go away.   Health Maintenance, Female Adopting a healthy lifestyle and getting preventive care can go a long way to promote health and wellness. Talk with your health care provider about what schedule of regular examinations is right for you. This is a good chance for you to check in with your provider about disease prevention and staying healthy. In between checkups, there are plenty of things you can do on your own. Experts have done a lot of research about which lifestyle changes and preventive measures are most likely to keep you healthy. Ask your health care provider for more information. WEIGHT AND DIET  Eat a healthy diet  Be sure to include plenty of vegetables, fruits, low-fat dairy products, and lean protein.  Do not eat a lot of foods high in solid fats, added sugars, or salt.  Get regular exercise. This is one of the most important things you can do for your health.  Most adults should exercise for at least 150 minutes each week. The exercise should increase your heart rate and make you sweat (moderate-intensity exercise).  Most adults should also do strengthening exercises at least twice a week. This is in addition to the moderate-intensity exercise.  Maintain a healthy weight  Body mass index (BMI) is a measurement that can be used to identify possible weight problems. It estimates body fat based on height and weight. Your health care provider can help determine your BMI and help you achieve or maintain a healthy weight.  For females 74 years of age and older:   A BMI below 18.5 is considered underweight.  A BMI of 18.5 to 24.9 is normal.  A BMI of 25 to 29.9 is considered overweight.  A BMI of 30 and above is considered obese.  Watch levels of cholesterol and  blood lipids  You should start having your blood tested for lipids and cholesterol at 63 years of age, then have this test every 5 years.  You may need to have your cholesterol levels checked more often if:  Your lipid or cholesterol levels are high.  You are older than 63 years of age.  You are at high risk for heart disease.  CANCER SCREENING   Lung Cancer  Lung cancer screening is recommended for adults 7-22 years old who are at high risk for lung cancer because of a history of smoking.  A yearly low-dose CT scan of the lungs is recommended for people who:  Currently smoke.  Have quit within the past 15 years.  Have at least a 30-pack-year history of smoking. A pack year is smoking an average of one pack of cigarettes a day for 1 year.  Yearly screening should continue until it has been 15 years since you quit.  Yearly screening should stop if you develop a health problem that would prevent you from having lung cancer treatment.  Breast Cancer  Practice breast self-awareness. This means understanding how your breasts normally appear and feel.  It also means doing regular breast self-exams. Let your health care provider know about any changes, no matter how small.  If you are in your 20s or 30s, you should have a clinical breast exam (CBE) by a health care provider every 1-3 years as part  of a regular health exam.  If you are 41 or older, have a CBE every year. Also consider having a breast X-ray (mammogram) every year.  If you have a family history of breast cancer, talk to your health care provider about genetic screening.  If you are at high risk for breast cancer, talk to your health care provider about having an MRI and a mammogram every year.  Breast cancer gene (BRCA) assessment is recommended for women who have family members with BRCA-related cancers. BRCA-related cancers include:  Breast.  Ovarian.  Tubal.  Peritoneal cancers.  Results of the  assessment will determine the need for genetic counseling and BRCA1 and BRCA2 testing. Cervical Cancer Your health care provider may recommend that you be screened regularly for cancer of the pelvic organs (ovaries, uterus, and vagina). This screening involves a pelvic examination, including checking for microscopic changes to the surface of your cervix (Pap test). You may be encouraged to have this screening done every 3 years, beginning at age 89.  For women ages 63-65, health care providers may recommend pelvic exams and Pap testing every 3 years, or they may recommend the Pap and pelvic exam, combined with testing for human papilloma virus (HPV), every 5 years. Some types of HPV increase your risk of cervical cancer. Testing for HPV may also be done on women of any age with unclear Pap test results.  Other health care providers may not recommend any screening for nonpregnant women who are considered low risk for pelvic cancer and who do not have symptoms. Ask your health care provider if a screening pelvic exam is right for you.  If you have had past treatment for cervical cancer or a condition that could lead to cancer, you need Pap tests and screening for cancer for at least 20 years after your treatment. If Pap tests have been discontinued, your risk factors (such as having a new sexual partner) need to be reassessed to determine if screening should resume. Some women have medical problems that increase the chance of getting cervical cancer. In these cases, your health care provider may recommend more frequent screening and Pap tests. Colorectal Cancer  This type of cancer can be detected and often prevented.  Routine colorectal cancer screening usually begins at 63 years of age and continues through 63 years of age.  Your health care provider may recommend screening at an earlier age if you have risk factors for colon cancer.  Your health care provider may also recommend using home test kits  to check for hidden blood in the stool.  A small camera at the end of a tube can be used to examine your colon directly (sigmoidoscopy or colonoscopy). This is done to check for the earliest forms of colorectal cancer.  Routine screening usually begins at age 24.  Direct examination of the colon should be repeated every 5-10 years through 63 years of age. However, you may need to be screened more often if early forms of precancerous polyps or small growths are found. Skin Cancer  Check your skin from head to toe regularly.  Tell your health care provider about any new moles or changes in moles, especially if there is a change in a mole's shape or color.  Also tell your health care provider if you have a mole that is larger than the size of a pencil eraser.  Always use sunscreen. Apply sunscreen liberally and repeatedly throughout the day.  Protect yourself by wearing long sleeves, pants, a  wide-brimmed hat, and sunglasses whenever you are outside. HEART DISEASE, DIABETES, AND HIGH BLOOD PRESSURE   High blood pressure causes heart disease and increases the risk of stroke. High blood pressure is more likely to develop in:  People who have blood pressure in the high end of the normal range (130-139/85-89 mm Hg).  People who are overweight or obese.  People who are African American.  If you are 88-76 years of age, have your blood pressure checked every 3-5 years. If you are 59 years of age or older, have your blood pressure checked every year. You should have your blood pressure measured twice--once when you are at a hospital or clinic, and once when you are not at a hospital or clinic. Record the average of the two measurements. To check your blood pressure when you are not at a hospital or clinic, you can use:  An automated blood pressure machine at a pharmacy.  A home blood pressure monitor.  If you are between 22 years and 56 years old, ask your health care provider if you should  take aspirin to prevent strokes.  Have regular diabetes screenings. This involves taking a blood sample to check your fasting blood sugar level.  If you are at a normal weight and have a low risk for diabetes, have this test once every three years after 63 years of age.  If you are overweight and have a high risk for diabetes, consider being tested at a younger age or more often. PREVENTING INFECTION  Hepatitis B  If you have a higher risk for hepatitis B, you should be screened for this virus. You are considered at high risk for hepatitis B if:  You were born in a country where hepatitis B is common. Ask your health care provider which countries are considered high risk.  Your parents were born in a high-risk country, and you have not been immunized against hepatitis B (hepatitis B vaccine).  You have HIV or AIDS.  You use needles to inject street drugs.  You live with someone who has hepatitis B.  You have had sex with someone who has hepatitis B.  You get hemodialysis treatment.  You take certain medicines for conditions, including cancer, organ transplantation, and autoimmune conditions. Hepatitis C  Blood testing is recommended for:  Everyone born from [birth year] through 1963.  Anyone with known risk factors for hepatitis C. Sexually transmitted infections (STIs)  You should be screened for sexually transmitted infections (STIs) including gonorrhea and chlamydia if:  You are sexually active and are younger than 63 years of age.  You are older than 63 years of age and your health care provider tells you that you are at risk for this type of infection.  Your sexual activity has changed since you were last screened and you are at an increased risk for chlamydia or gonorrhea. Ask your health care provider if you are at risk.  If you do not have HIV, but are at risk, it may be recommended that you take a prescription medicine daily to prevent HIV infection. This is called  pre-exposure prophylaxis (PrEP). You are considered at risk if:  You are sexually active and do not regularly use condoms or know the HIV status of your partner(s).  You take drugs by injection.  You are sexually active with a partner who has HIV. Talk with your health care provider about whether you are at high risk of being infected with HIV. If you choose to begin PrEP,  you should first be tested for HIV. You should then be tested every 3 months for as long as you are taking PrEP.  PREGNANCY   If you are premenopausal and you may become pregnant, ask your health care provider about preconception counseling.  If you may become pregnant, take 400 to 800 micrograms (mcg) of folic acid every day.  If you want to prevent pregnancy, talk to your health care provider about birth control (contraception). OSTEOPOROSIS AND MENOPAUSE   Osteoporosis is a disease in which the bones lose minerals and strength with aging. This can result in serious bone fractures. Your risk for osteoporosis can be identified using a bone density scan.  If you are 2 years of age or older, or if you are at risk for osteoporosis and fractures, ask your health care provider if you should be screened.  Ask your health care provider whether you should take a calcium or vitamin D supplement to lower your risk for osteoporosis.  Menopause may have certain physical symptoms and risks.  Hormone replacement therapy may reduce some of these symptoms and risks. Talk to your health care provider about whether hormone replacement therapy is right for you.  HOME CARE INSTRUCTIONS   Schedule regular health, dental, and eye exams.  Stay current with your immunizations.   Do not use any tobacco products including cigarettes, chewing tobacco, or electronic cigarettes.  If you are pregnant, do not drink alcohol.  If you are breastfeeding, limit how much and how often you drink alcohol.  Limit alcohol intake to no more than 1  drink per day for nonpregnant women. One drink equals 12 ounces of beer, 5 ounces of wine, or 1 ounces of hard liquor.  Do not use street drugs.  Do not share needles.  Ask your health care provider for help if you need support or information about quitting drugs.  Tell your health care provider if you often feel depressed.  Tell your health care provider if you have ever been abused or do not feel safe at home.   This information is not intended to replace advice given to you by your health care provider. Make sure you discuss any questions you have with your health care provider.   Document Released: 03/12/2011 Document Revised: 09/17/2014 Document Reviewed: 07/29/2013 Elsevier Interactive Patient Education [employer].

## 2015-10-28 LAB — HIV ANTIBODY (ROUTINE TESTING W REFLEX): HIV 1&2 Ab, 4th Generation: NONREACTIVE

## 2015-10-28 LAB — HEPATITIS C ANTIBODY: HCV Ab: NEGATIVE

## 2016-01-10 ENCOUNTER — Encounter: Payer: Self-pay | Admitting: *Deleted

## 2016-01-10 ENCOUNTER — Telehealth: Payer: Self-pay | Admitting: Neurology

## 2016-01-10 NOTE — Telephone Encounter (Addendum)
Spoke to Dr. Krista Blue, decrease gabapentin dose to 300mg , BID and give patient an appointment for this week.  Called her back - she verbalized understanding of dose change and an appt has been scheduled for her.

## 2016-01-10 NOTE — Telephone Encounter (Signed)
Calling patient now.

## 2016-01-10 NOTE — Telephone Encounter (Signed)
She started the increased dose of gabapentin 300mg , QID on 10/06/15.  She has noticed mild, intermittent tongue swelling since this increase in dosage.  Reports her tongue swelling has been constant and getting worse over the last three weeks.

## 2016-01-10 NOTE — Telephone Encounter (Signed)
Patient called regarding tongue swelling from Gabapentin, is "having difficulty talking because she keeps biting her tongue", denies difficulty breathing at this time.

## 2016-01-12 ENCOUNTER — Ambulatory Visit (HOSPITAL_COMMUNITY)
Admission: EM | Admit: 2016-01-12 | Discharge: 2016-01-12 | Disposition: A | Discharge status: Home / Self Care | Payer: Federal, State, Local not specified - PPO | Attending: Family Medicine | Admitting: Family Medicine

## 2016-01-12 ENCOUNTER — Encounter (HOSPITAL_COMMUNITY): Payer: Self-pay | Admitting: *Deleted

## 2016-01-12 ENCOUNTER — Ambulatory Visit: Payer: Self-pay | Admitting: Neurology

## 2016-01-12 ENCOUNTER — Ambulatory Visit (INDEPENDENT_AMBULATORY_CARE_PROVIDER_SITE_OTHER): Payer: Federal, State, Local not specified - PPO

## 2016-01-12 ENCOUNTER — Telehealth: Payer: Self-pay | Admitting: Neurology

## 2016-01-12 DIAGNOSIS — S63502A Unspecified sprain of left wrist, initial encounter: Secondary | ICD-10-CM

## 2016-01-12 DIAGNOSIS — S8002XA Contusion of left knee, initial encounter: Secondary | ICD-10-CM

## 2016-01-12 MED ORDER — HYDROMORPHONE HCL 1 MG/ML IJ SOLN
2.0000 mg | Freq: Once | INTRAMUSCULAR | Status: AC
  Administered 2016-01-12: 2 mg via INTRAMUSCULAR

## 2016-01-12 MED ORDER — HYDROMORPHONE HCL 1 MG/ML IJ SOLN
INTRAMUSCULAR | Status: AC
  Filled 2016-01-12: qty 2

## 2016-01-12 MED ORDER — OXYCODONE-ACETAMINOPHEN 5-325 MG PO TABS: 1.0000 | ORAL_TABLET | Freq: Four times a day (QID) | ORAL | Status: DC | PRN

## 2016-01-12 MED ORDER — ONDANSETRON 4 MG PO TBDP
4.0000 mg | ORAL_TABLET | Freq: Once | ORAL | Status: AC
  Administered 2016-01-12: 4 mg via ORAL

## 2016-01-12 MED ORDER — ONDANSETRON 4 MG PO TBDP
ORAL_TABLET | ORAL | Status: AC
  Filled 2016-01-12: qty 1

## 2016-01-12 NOTE — ED Provider Notes (Signed)
CSN: [id number]     Arrival date & time 01/12/16  1518 History   None    Chief Complaint  Patient presents with  . Fall   (Consider location/radiation/quality/duration/timing/severity/associated sxs/prior Treatment) Patient is a 65 y.o. female presenting with fall. The history is provided by the patient and the spouse.  Fall This is a new problem. The current episode started 1 to 2 hours ago. The problem has been gradually worsening. Associated symptoms comments: Pain left wrist and knee when tripped in yard and fell backwards..    Past Medical History  Diagnosis Date  . Diverticulosis   . History of shingles 2012--  no residual pain  . History of kidney stones   . Left ureteral calculus   . Trigeminal neuralgia RIGHT  . Arthritis shoulders and back  . 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 Urgency of urination   . History of kidney stones   . 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 Fibromyalgia    Past Surgical History  Procedure Laterality Date  . Wisdom tooth extraction    . Resection colostomy/ closure colostomy with coloproctostomy  02-20-2007  . Exploratory laparotomy/ resection mid to distal sigmoid and proximal rectum/ end proximal sigmoid colostomy  07-17-2006    PERFORATED DIVERTICULITIS WITH PERITONITIS  . Cerebral microvascular decompression  07-05-2006    RIGHT TRIGEMINAL NERVE  . Tonsillectomy  AS CHILD  . Vaginal hysterectomy  1998    Partial  . Ureteroscopy  07/04/2012    Procedure: URETEROSCOPY;  Surgeon: Claybon Jabs, MD;  Location: [facility];  Service: Urology;  Laterality: Left;  . Cystoscopy/retrograde/ureteroscopy/stone extraction with basket  07/04/2012    Procedure: CYSTOSCOPY/RETROGRADE/URETEROSCOPY/STONE EXTRACTION WITH BASKET;  Surgeon: Claybon Jabs, MD;  Location: [facility];  Service: Urology;  Laterality: Left;   Family History  Problem Relation Age of Onset  . Heart disease Maternal Grandmother     . Diabetes Mother   . Breast cancer Maternal Aunt   . Lupus Cousin   . Colon cancer Neg Hx   . Esophageal cancer Neg Hx   . Rectal cancer Neg Hx   . Stomach cancer Neg Hx    Social History  Substance Use Topics  . Smoking status: Never Smoker   . Smokeless tobacco: Never Used  . Alcohol Use: Yes     Comment: RARE   OB History    No data available     Review of Systems  Constitutional: Negative.   Musculoskeletal: Positive for joint swelling and gait problem. Negative for back pain.  Skin: Negative.   All other systems reviewed and are negative.   Allergies  Clindamycin hcl  Home Medications   Prior to Admission medications   Medication Sig Start Date End Date Taking? Authorizing Provider  cyclobenzaprine (FLEXERIL) 10 MG tablet Take 1 tablet (10 mg total) by mouth at bedtime. 10/07/14   Dorena Cookey, MD  docusate sodium (COLACE) 250 MG capsule Take 400 mg by mouth daily.    Historical Provider, MD  gabapentin (NEURONTIN) 300 MG capsule Take 1 capsule (300 mg total) by mouth 4 (four) times daily. 10/06/15   Dennie Bible, NP  magnesium hydroxide (MILK OF MAGNESIA) 400 MG/5ML suspension Take 15 mLs by mouth daily as needed for mild constipation.    Historical Provider, MD  oxyCODONE-acetaminophen (PERCOCET/ROXICET) 5-325 MG tablet Take 1 tablet by mouth every 6 (six) hours as needed for moderate pain or severe pain. 01/12/16  Billy Fischer, MD  polyethylene glycol [facility] / GLYCOLAX) packet Take 17 g by mouth 2 (two) times daily.    Historical Provider, MD   Meds Ordered and Administered this Visit   Medications  ondansetron (ZOFRAN-ODT) disintegrating tablet 4 mg (4 mg Oral Given 01/12/16 1558)  HYDROmorphone (DILAUDID) injection 2 mg (2 mg Intramuscular Given 01/12/16 1558)    BP 137/88 mmHg  Pulse 78  Temp(Src) 98.6 F (37 C) (Oral)  Resp 18  SpO2 98% No data found.   Physical Exam  Constitutional: She is oriented to person, place, and time. She appears  well-developed and well-nourished.  HENT:  Head: Normocephalic and atraumatic.  Neck: Normal range of motion. Neck supple.  Musculoskeletal: She exhibits tenderness.       Left wrist: She exhibits decreased range of motion, tenderness, bony tenderness and swelling. She exhibits no deformity.       Left knee: She exhibits decreased range of motion and swelling. She exhibits no deformity. Tenderness found.       Arms: Neurological: She is alert and oriented to person, place, and time.  Skin: Skin is warm and dry.  Nursing note and vitals reviewed.   ED Course  Procedures (including critical care time)  Labs Review Labs Reviewed - No data to display  Imaging Review Dg Forearm Left  01/12/2016  CLINICAL DATA:  Pain after fall EXAM: LEFT FOREARM - 2 VIEW COMPARISON:  None. FINDINGS: There is no evidence of fracture or other focal bone lesions. Soft tissues are unremarkable. IMPRESSION: Negative. Electronically Signed   By: Dorise Bullion III M.D   On: 01/12/2016 16:11   Dg Knee Complete 4 Views Left  01/12/2016  CLINICAL DATA:  Fall today with left knee pain, initial encounter EXAM: LEFT KNEE - COMPLETE 4+ VIEW COMPARISON:  None. FINDINGS: Tricompartmental degenerative change is noted. No acute fracture or dislocation is seen. No gross soft tissue abnormality is noted. IMPRESSION: No acute abnormality noted. Electronically Signed   By: Inez Catalina M.D.   On: 01/12/2016 16:21   X-rays reviewed and report per radiologist.   Visual Acuity Review  Right Eye Distance:   Left Eye Distance:   Bilateral Distance:    Right Eye Near:   Left Eye Near:    Bilateral Near:         MDM   1. Wrist sprain, left, initial encounter   2. Knee contusion, left, initial encounter        Billy Fischer, MD 01/12/16 2055

## 2016-01-12 NOTE — ED Notes (Signed)
Pt  Felled   Today  While   Working  In  [employer]        She  Has  Pain in  The  l   Wrist   As  Well  As  The l knee    -          She  Is  Awake   And  Alert  And oriented          She  Is  Able  To bear   Weight  On  The  Affected       Knee          At  This  Time  She  Is  Sitting  In a  Wheelchair       Husband  Is  At  Bedside

## 2016-01-12 NOTE — Telephone Encounter (Signed)
No show follow up appt - please see below.

## 2016-01-12 NOTE — Telephone Encounter (Signed)
Pt called said she was on the way to the hospital and could not come to appt today. Operator asked pt if it was her that was going to the hospital, she replied "yes, I'm going to the hospital now". I assured her the appt would be c/a. Pt did not say why she was going to the hospital.

## 2016-01-12 NOTE — Discharge Instructions (Signed)
Ice, splint and pain medicine as needed, see your doctor as needed.

## 2016-01-16 ENCOUNTER — Encounter: Payer: Self-pay | Admitting: Neurology

## 2016-02-02 ENCOUNTER — Ambulatory Visit (INDEPENDENT_AMBULATORY_CARE_PROVIDER_SITE_OTHER): Payer: Federal, State, Local not specified - PPO | Admitting: Internal Medicine

## 2016-02-02 ENCOUNTER — Encounter: Payer: Self-pay | Admitting: Internal Medicine

## 2016-02-02 ENCOUNTER — Other Ambulatory Visit (INDEPENDENT_AMBULATORY_CARE_PROVIDER_SITE_OTHER): Payer: Federal, State, Local not specified - PPO

## 2016-02-02 VITALS — BP 140/82 | HR 95 | Temp 98.4°F | Resp 20 | Wt 254.0 lb

## 2016-02-02 DIAGNOSIS — K140 Glossitis: Secondary | ICD-10-CM | POA: Insufficient documentation

## 2016-02-02 DIAGNOSIS — J309 Allergic rhinitis, unspecified: Secondary | ICD-10-CM

## 2016-02-02 DIAGNOSIS — G500 Trigeminal neuralgia: Secondary | ICD-10-CM | POA: Diagnosis not present

## 2016-02-02 DIAGNOSIS — M797 Fibromyalgia: Secondary | ICD-10-CM | POA: Diagnosis not present

## 2016-02-02 HISTORY — DX: Glossitis: K14.0

## 2016-02-02 LAB — CBC WITH DIFFERENTIAL/PLATELET
Basophils Absolute: 0.1 10*3/uL (ref 0.0–0.1)
Basophils Relative: 0.5 % (ref 0.0–3.0)
Eosinophils Absolute: 0 10*3/uL (ref 0.0–0.7)
Eosinophils Relative: 0.3 % (ref 0.0–5.0)
HCT: 40.4 % (ref 36.0–46.0)
Hemoglobin: 13 g/dL (ref 12.0–15.0)
Lymphocytes Relative: 11.5 % — ABNORMAL LOW (ref 12.0–46.0)
Lymphs Abs: 1.3 10*3/uL (ref 0.7–4.0)
MCHC: 32.1 g/dL (ref 30.0–36.0)
MCV: 81.9 fl (ref 78.0–100.0)
Monocytes Absolute: 0.6 10*3/uL (ref 0.1–1.0)
Monocytes Relative: 4.7 % (ref 3.0–12.0)
Neutro Abs: 9.8 10*3/uL — ABNORMAL HIGH (ref 1.4–7.7)
Neutrophils Relative %: 83 % — ABNORMAL HIGH (ref 43.0–77.0)
Platelets: 440 10*3/uL — ABNORMAL HIGH (ref 150.0–400.0)
RBC: 4.94 Mil/uL (ref 3.87–5.11)
RDW: 15.1 % (ref 11.5–15.5)
WBC: 11.8 10*3/uL — ABNORMAL HIGH (ref 4.0–10.5)

## 2016-02-02 LAB — BASIC METABOLIC PANEL
BUN: 18 mg/dL (ref 6–23)
CO2: 30 mEq/L (ref 19–32)
Calcium: 9.6 mg/dL (ref 8.4–10.5)
Chloride: 103 mEq/L (ref 96–112)
Creatinine, Ser: 0.94 mg/dL (ref 0.40–1.20)
GFR: 77.39 mL/min (ref >60.00)
Glucose, Bld: 103 mg/dL — ABNORMAL HIGH (ref 70–99)
Potassium: 3.4 mEq/L — ABNORMAL LOW (ref 3.5–5.1)
Sodium: 139 mEq/L (ref 135–145)

## 2016-02-02 MED ORDER — PREDNISONE 10 MG PO TABS: ORAL_TABLET | ORAL | Status: DC

## 2016-02-02 MED ORDER — NYSTATIN 100000 UNIT/ML MT SUSP: 500000.0000 [IU] | Freq: Four times a day (QID) | OROMUCOSAL | Status: DC

## 2016-02-02 NOTE — Patient Instructions (Signed)
You had the steroid shot today  Please take all new medication as prescribed - the prednisone, and the nystatin solution for the tongue  Please continue all other medications as before, and refills have been done if requested.  Please have the pharmacy call with any other refills you may need.  Please keep your appointments with your specialists as you may have planned  Please go to the LAB in the Basement (turn left off the elevator) for the tests to be done today  You will be contacted by phone if any changes need to be made immediately.  Otherwise, you will receive a letter about your results with an explanation, but please check with MyChart first.  Please remember to sign up for MyChart if you have not done so, as this will be important to you in the future with finding out test results, communicating by private email, and scheduling acute appointments online when needed.

## 2016-02-02 NOTE — Assessment & Plan Note (Signed)
Mild, for lab eval including food and environmental allergies

## 2016-02-02 NOTE — Assessment & Plan Note (Signed)
With some residual pain, d/w pt to f/u with Dr Krista Blue,  to f/u any worsening symptoms or concerns

## 2016-02-02 NOTE — Assessment & Plan Note (Addendum)
?   Etiology, ? Allergic vs other, for depomedrol IM,  predpac asd,  to f/u any worsening symptoms or concern, also for nystatin asd trial

## 2016-02-02 NOTE — Assessment & Plan Note (Signed)
Ok to cont the voltaren gel,  to f/u any worsening symptoms or concerns

## 2016-02-02 NOTE — Progress Notes (Signed)
Pre visit review using our clinic review tool, if applicable. No additional management support is needed unless otherwise documented below in the visit note. 

## 2016-02-02 NOTE — Progress Notes (Signed)
Subjective:    Patient ID: Summer [name], female    DOB: 1951-06-22, 65 y.o.   MRN: [id number]  HPI  Here to f/u as PCP not available, has hx of right sided Trig neuralgia s/p surgical intervention about 8 yrs ago, has residual right sided facial pain, and occas right tongue swelling sensation with burning sandpaper like, more recently has a situation where she has lost some teeth over the years and now with feeling on left tongue like she bit the tongue, also mentions some gum issues after orthodontic surgury years ago where it feels like "it never healed."  Now with 3 wks diffuse tongue swelling and painful and can see teeth imprints to the sides of the tongue that usually are not there,, no new medications except volt gel per neurology to apply to worst places of FMS pain; no known food allergies, but does wonder about chocolate recently.  Does have several wks ongoing nasal allergy symptoms with clearish congestion, itch and sneezing, without fever, pain, ST, cough, swelling or wheezing. No fever or tongue coating. Taste is OK.   Past Medical History  Diagnosis Date  . Diverticulosis   . History of shingles 2012--  no residual pain  . History of kidney stones   . Left ureteral calculus   . Trigeminal neuralgia RIGHT  . Arthritis shoulders and back  . 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 Urgency of urination   . History of kidney stones   . 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 Fibromyalgia    Past Surgical History  Procedure Laterality Date  . Wisdom tooth extraction    . Resection colostomy/ closure colostomy with coloproctostomy  02-20-2007  . Exploratory laparotomy/ resection mid to distal sigmoid and proximal rectum/ end proximal sigmoid colostomy  07-17-2006    PERFORATED DIVERTICULITIS WITH PERITONITIS  . Cerebral microvascular decompression  07-05-2006    RIGHT TRIGEMINAL NERVE  . Tonsillectomy  AS CHILD  . Vaginal hysterectomy  1998    Partial  .  Ureteroscopy  07/04/2012    Procedure: URETEROSCOPY;  Surgeon: Claybon Jabs, MD;  Location: [facility];  Service: Urology;  Laterality: Left;  . Cystoscopy/retrograde/ureteroscopy/stone extraction with basket  07/04/2012    Procedure: CYSTOSCOPY/RETROGRADE/URETEROSCOPY/STONE EXTRACTION WITH BASKET;  Surgeon: Claybon Jabs, MD;  Location: [facility];  Service: Urology;  Laterality: Left;    reports that she has never smoked. She has never used smokeless tobacco. She reports that she drinks alcohol. She reports that she does not use illicit drugs. family history includes Breast cancer in her maternal aunt; Diabetes in her mother; Heart disease in her maternal grandmother; Lupus in her cousin. There is no history of Colon cancer, Esophageal cancer, Rectal cancer, or Stomach cancer. Allergies  Allergen Reactions  . Clindamycin Hcl Itching    itch   Current Outpatient Prescriptions on File Prior to Visit  Medication Sig Dispense Refill  . gabapentin (NEURONTIN) 300 MG capsule Take 1 capsule (300 mg total) by mouth 4 (four) times daily. 120 capsule 11   No current facility-administered medications on file prior to visit.     Review of Systems  Constitutional: Negative for unusual diaphoresis or night sweats HENT: Negative for ear swelling or discharge Eyes: Negative for worsening visual haziness  Respiratory: Negative for choking and stridor.   Gastrointestinal: Negative for distension or worsening eructation Genitourinary: Negative for retention or change in urine volume.  Musculoskeletal: Negative for other MSK pain  or swelling Skin: Negative for color change and worsening wound Neurological: Negative for tremors and numbness other than noted  Psychiatric/Behavioral: Negative for decreased concentration or agitation other than above       Objective:   Physical Exam BP 140/82 mmHg  Pulse 95  Temp(Src) 98.4 F (36.9 C) (Oral)  Resp 20  Wt 254 lb  (115.214 kg)  SpO2 95% VS noted, not ill appearing Constitutional: Pt appears in no apparent distress HENT: Head: NCAT.  Right Ear: External ear normal.  Left Ear: External ear normal.  Bilat tm's with mild erythema.  Max sinus areas non tender.  Pharynx with mild erythema, no exudate'; tongue somewhat swollen diffusely, NT to palpate, may have dorsal coating, no ulcers or mass found Eyes: . Pupils are equal, round, and reactive to light. Conjunctivae and EOM are normal Neck: Normal range of motion. Neck supple.  Cardiovascular: Normal rate and regular rhythm.   Pulmonary/Chest: Effort normal and breath sounds without rales or wheezing.  Neurological: Pt is alert. Not confused , motor grossly intact Skin: Skin is warm. No rash, no LE edema Psychiatric: Pt behavior is normal. No agitation. mild nervous    Assessment & Plan:

## 2016-02-03 LAB — MIDATLANTIC REGIONAL ALLERGY PANEL (DC,DE,MD,~~LOC~~,VA,WV)
Allergen, Cottonwood, t14: 0.22 kU/L — ABNORMAL HIGH
Allergen, Oak,t7: <0.1 kU/L
Allergen, Walnut,t10: 0.32 kU/L — ABNORMAL HIGH
Alternaria Alternata: <0.1 kU/L
Bermuda Grass: 4.04 kU/L — ABNORMAL HIGH
Box Elder IgE: 0.15 kU/L — ABNORMAL HIGH
Common Ragweed: 0.61 kU/L — ABNORMAL HIGH
Elm IgE: 0.19 kU/L — ABNORMAL HIGH
Johnson Grass: 1.67 kU/L — ABNORMAL HIGH
Meadow Grass: 5.03 kU/L — ABNORMAL HIGH

## 2016-02-09 ENCOUNTER — Telehealth: Payer: Self-pay | Admitting: Neurology

## 2016-02-09 ENCOUNTER — Encounter: Payer: Self-pay | Admitting: Neurology

## 2016-02-09 ENCOUNTER — Ambulatory Visit (INDEPENDENT_AMBULATORY_CARE_PROVIDER_SITE_OTHER): Payer: Federal, State, Local not specified - PPO | Admitting: Neurology

## 2016-02-09 VITALS — BP 136/76 | HR 64 | Ht 66.0 in | Wt 252.2 lb

## 2016-02-09 DIAGNOSIS — G500 Trigeminal neuralgia: Secondary | ICD-10-CM | POA: Diagnosis not present

## 2016-02-09 MED ORDER — GABAPENTIN 300 MG PO CAPS: 600.0000 mg | ORAL_CAPSULE | Freq: Four times a day (QID) | ORAL | Status: DC

## 2016-02-09 MED ORDER — LAMOTRIGINE 25 MG PO TABS: ORAL_TABLET | ORAL | Status: DC

## 2016-02-09 MED ORDER — LAMOTRIGINE 100 MG PO TABS: 100.0000 mg | ORAL_TABLET | Freq: Two times a day (BID) | ORAL | Status: DC

## 2016-02-09 NOTE — Progress Notes (Signed)
Chief Complaint  Patient presents with  . Trigeminal Neuralgia    Her right-sided facial pain has worsened.  She initially thought her tongue was swelling due to her increased dose of gabapentin in January.  She called our office and was instructed to lower her dose but she decided not to do this until seen in the office.  She is currenlty taking 300mg  QID.     [facility]  PATIENT: [name] DOB: [birth year]   REASON FOR VISIT: Follow-up for right trigeminal neuralgia HISTORY FROM: Patient    HISTORY OF PRESENT ILLNESS: HISTORYMs. Bowhay is a 65 years old right-handed African American female, referred by her primary care physician Dr. Stevie Kern for evaluation of right facial pain  She was diagnosed with right trigeminal neuralgia in 2006, presenting with severe electronics shooting pain at her right upper molar, she had microvascular decompression surgery at [facility] by Dr. Harrison Mons around 2006, which has helped the severe pain, but she continued to have annoying almost constant pain at her right face, she describes hot cold sensitivity over right face, touching of right forehead region will also set of her right facial pain, she felt right tongue swelling, she bites on her right tongue sometimes, she works at customer service, on the phone many hours each day, with prolonged talking, she felt sandpaper was rubbing against her right tongue, also complains of right upper and lower lip numbness, intermittent electronics shooting pain,  Over the years, she has tried different medication, was evaluated by our office Dr. Brett Fairy in 2008, she has tried gabapentin, Lyrica, Cymbalta, Trileptal, currently is taking Tegretol 200 mg twice a day, initially it helps her some, but failed to help work consistently, She denied visual change, recent few months of left lower back, left hip pain, radiating to left posterior thigh, to her left leg and left foot, difficulty  walking after prolonged sitting She complains of left low back pain, left hip pain, radiating pain to left foot, like walking on a dead, burning sensation, she continued to complains of right facial constant achy pain, there was no significant change after started on Oxtellar xR 300 mg every day, no significant side effects,  I have reviewed MRI, mild small vessel disease, no significant abnormality otherwise,  She had MRI lumbar spine (without) demonstrating: L5-S1: facet hypertrophy with moderate right and severe left foraminal stenosis  L4-5: pseudo disc bulging and facet hypertrophy with moderate right and mild left foraminal stenosis. There is 2 mm anterior spondylolisthesis of L4 on L5.  Degenerative spondylosis and disc bulging L1-2 and L4-5.  UPDATE Sep 30 2014: She felt sand paper sensation in her right tongue, flashing sensation, last 1-2 weeks, only take gabapentin 300mg  tid now. She also complains of snoring, frequent wakening during the nighttime, excessive sleepiness at daytime, F ESS score is 37, ESS is 9  UPDATE 10/06/2015 Ms. Clarizio, 65 year old female returns for follow-up. She has a history of right trigeminal neuralgia is currently on Neurontin 300 mg 3 times a day has breakthrough pain. 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 She occasionally will have upper and lower lip numbness. She returns for reevaluation  UPDATE February 09 2016: She complained increased right facial pain since April 2017, involving right V1/V2 branch, she is taking gabapentin 300 mg 4 times a day, only helped her mildly, triggered by wind blow on her  right face, talking, chewing, she works as a Restaurant manager, fast food, it is very difficult for her to perform her job,  REVIEW OF SYSTEMS: Full 14 system review of systems performed and notable only for those listed, all others are neg: Blurred vision,  fatigue    ALLERGIES: Allergies  Allergen Reactions  . Clindamycin Hcl Itching    itch    HOME MEDICATIONS: Outpatient Prescriptions Prior to Visit  Medication Sig Dispense Refill  . gabapentin (NEURONTIN) 300 MG capsule Take 1 capsule (300 mg total) by mouth 4 (four) times daily. 120 capsule 11  . nystatin (MYCOSTATIN) 100000 UNIT/ML suspension Take 5 mLs (500,000 Units total) by mouth 4 (four) times daily. 60 mL 2  . predniSONE (DELTASONE) 10 MG tablet 3 tabs by mouth per day for 3 days,2tabs per day for 3 days,1tab per day for 3 days 18 tablet 0   No facility-administered medications prior to visit.    PAST MEDICAL HISTORY: Past Medical History  Diagnosis Date  . Diverticulosis   . History of shingles 2012--  no residual pain  . History of kidney stones   . Left ureteral calculus   . Trigeminal neuralgia RIGHT  . Arthritis shoulders and back  . 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 Urgency of urination   . History of kidney stones   . 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 Fibromyalgia     PAST SURGICAL HISTORY: Past Surgical History  Procedure Laterality Date  . Wisdom tooth extraction    . Resection colostomy/ closure colostomy with coloproctostomy  02-20-2007  . Exploratory laparotomy/ resection mid to distal sigmoid and proximal rectum/ end proximal sigmoid colostomy  07-17-2006    PERFORATED DIVERTICULITIS WITH PERITONITIS  . Cerebral microvascular decompression  07-05-2006    RIGHT TRIGEMINAL NERVE  . Tonsillectomy  AS CHILD  . Vaginal hysterectomy  1998    Partial  . Ureteroscopy  07/04/2012    Procedure: URETEROSCOPY;  Surgeon: Claybon Jabs, MD;  Location: [facility];  Service: Urology;  Laterality: Left;  . Cystoscopy/retrograde/ureteroscopy/stone extraction with basket  07/04/2012    Procedure: CYSTOSCOPY/RETROGRADE/URETEROSCOPY/STONE EXTRACTION WITH BASKET;  Surgeon: Claybon Jabs, MD;  Location: [facility];  Service: Urology;  Laterality: Left;    FAMILY HISTORY: Family History  Problem Relation Age of Onset  . Heart disease Maternal Grandmother   . Diabetes Mother   . Breast cancer Maternal Aunt   . Lupus Cousin   . Colon cancer Neg Hx   . Esophageal cancer Neg Hx   . Rectal cancer Neg Hx   . Stomach cancer Neg Hx     SOCIAL HISTORY: Social History   Social History  . Marital Status: Married    Spouse Name: N/A  . Number of Children: 1  . Years of Education: College   Occupational History  .    [employer]  [employer] [employer]   Social History Main Topics  . Smoking status: Never Smoker   . Smokeless tobacco: Never Used  . Alcohol Use: Yes     Comment: RARE  . Drug Use: No  . Sexual Activity: Not on file   Other Topics Concern  . Not on file   Social History Narrative     PHYSICAL EXAM  Filed Vitals:   02/09/16 0820  BP: 136/76  Pulse: 64  Height: 5\' 6"  (1.676 m)  Weight: 252 lb 4 oz (114.42 kg)   Body mass index is  40.73 kg/(m^2).   PHYSICAL EXAMNIATION:  Gen: NAD, conversant, well nourised, obese, well groomed                     Cardiovascular: Regular rate rhythm, no peripheral edema, warm, nontender. Eyes: Conjunctivae clear without exudates or hemorrhage Neck: Supple, no carotid bruise. Pulmonary: Clear to auscultation bilaterally   NEUROLOGICAL EXAM:  MENTAL STATUS:Mild distress Speech:    Speech is normal; fluent and spontaneous with normal comprehension.  Cognition:     Orientation to time, place and person     Normal recent and remote memory     Normal Attention span and concentration     Normal Language, naming, repeating,spontaneous speech     Fund of knowledge   CRANIAL NERVES: CN II: Visual fields are full to confrontation. Fundoscopic exam is normal with sharp discs and no vascular changes. Pupils are round equal and briskly reactive to light. CN III, IV, VI: extraocular movement are normal. No ptosis. CN V: Facial  sensation is intact to pinprick in all 3 divisions bilaterally. Corneal responses are intact.  CN VII: Face is symmetric with normal eye closure and smile. CN VIII: Hearing is normal to rubbing fingers CN IX, X: Palate elevates symmetrically. Phonation is normal. CN XI: Head turning and shoulder shrug are intact CN XII: Tongue is midline with normal movements and no atrophy.  MOTOR: There is no pronator drift of out-stretched arms. Muscle bulk and tone are normal. Muscle strength is normal.  REFLEXES: Reflexes are 2+ and symmetric at the biceps, triceps, knees, and ankles. Plantar responses are flexor.  SENSORY: Intact to light touch, pinprick, positional and vibratory sensation are intact in fingers and toes.  COORDINATION: Rapid alternating movements and fine finger movements are intact. There is no dysmetria on finger-to-nose and heel-knee-shin.    GAIT/STANCE: Posture is normal. Gait is steady with normal steps, base, arm swing, and turning. Heel and toe walking are normal. Tandem gait is normal.  Romberg is absent.     DIAGNOSTIC DATA (LABS, IMAGING, TESTING) -  ASSESSMENT AND PLAN 65 years old [place] American female, with long-standing history of right trigeminal neuralgia, had a history of microvascular decompression surgery by Dr. Redmond Pulling in 2006 at [facility], at baseline, she had mild persistent symptoms, she has tried multiple different medications in the past, including Tegretol, Trileptal, Neurontin, Lyrica, and Cymbalta, Oxtellar with limited result  Worsening right trigeminal neuralgia since April 2017  Increase gabapentin to 300 mg 2 tablets 4 times a day  Add on lamotrigine titrating to 100 mg twice a day  I will also refer her to [facility] for [facility]   Marcial Pacas, M.D. Ph.D.  [facility] [place], [place] [address] Phone: [phone] Fax:      [phone]

## 2016-02-09 NOTE — Telephone Encounter (Signed)
FYI-Pt did not stop to check-out on 6/1, called pt and LVM for pt to schedule a 1 month f/u per Dr. Rhea Belton notes.

## 2016-02-09 NOTE — Patient Instructions (Signed)
tephen B. Salomon Fick, M.D., Ph.D. Professor, Neurosurgery   Contact Information Department: [phone]

## 2016-02-10 ENCOUNTER — Encounter: Payer: Self-pay | Admitting: Neurology

## 2016-02-13 ENCOUNTER — Encounter: Payer: Self-pay | Admitting: Neurology

## 2016-02-14 MED ORDER — GABAPENTIN 300 MG PO CAPS: 600.0000 mg | ORAL_CAPSULE | Freq: Four times a day (QID) | ORAL | Status: DC

## 2016-02-14 NOTE — Telephone Encounter (Signed)
Summer [name], please see email about her symptoms, call her and check on her.

## 2016-02-14 NOTE — Telephone Encounter (Signed)
I called in gabapentin 300 mg 2 tablets 4 times a day, 240 mg for one month, with 11 refills, also send email to patients via my charter to notify her the change.

## 2016-02-15 ENCOUNTER — Encounter: Payer: Self-pay | Admitting: Neurology

## 2016-02-16 ENCOUNTER — Telehealth: Payer: Self-pay | Admitting: *Deleted

## 2016-02-16 NOTE — Telephone Encounter (Signed)
I called, [facility] to request pt mr brain and lumbar spine. Pt cd need to be mailed to [facility] asap.

## 2016-02-21 ENCOUNTER — Encounter (HOSPITAL_COMMUNITY): Payer: Self-pay

## 2016-02-21 ENCOUNTER — Emergency Department (HOSPITAL_COMMUNITY): Payer: Federal, State, Local not specified - PPO

## 2016-02-21 ENCOUNTER — Telehealth: Payer: Self-pay | Admitting: Internal Medicine

## 2016-02-21 ENCOUNTER — Emergency Department (HOSPITAL_COMMUNITY)
Admission: EM | Admit: 2016-02-21 | Discharge: 2016-02-21 | Disposition: A | Discharge status: Home / Self Care | Payer: Federal, State, Local not specified - PPO | Attending: Emergency Medicine | Admitting: Emergency Medicine

## 2016-02-21 DIAGNOSIS — R296 Repeated falls: Secondary | ICD-10-CM | POA: Diagnosis not present

## 2016-02-21 DIAGNOSIS — G500 Trigeminal neuralgia: Secondary | ICD-10-CM | POA: Diagnosis not present

## 2016-02-21 DIAGNOSIS — R260 Ataxic gait: Secondary | ICD-10-CM | POA: Diagnosis present

## 2016-02-21 DIAGNOSIS — M1990 Unspecified osteoarthritis, unspecified site: Secondary | ICD-10-CM | POA: Insufficient documentation

## 2016-02-21 DIAGNOSIS — Z79899 Other long term (current) drug therapy: Secondary | ICD-10-CM | POA: Diagnosis not present

## 2016-02-21 LAB — CBC
HCT: 37.5 % (ref 36.0–46.0)
Hemoglobin: 11.8 g/dL — ABNORMAL LOW (ref 12.0–15.0)
MCH: 26.6 pg (ref 26.0–34.0)
MCHC: 31.5 g/dL (ref 30.0–36.0)
MCV: 84.7 fL (ref 78.0–100.0)
Platelets: 342 10*3/uL (ref 150–400)
RBC: 4.43 MIL/uL (ref 3.87–5.11)
RDW: 15.2 % (ref 11.5–15.5)
WBC: 9.3 10*3/uL (ref 4.0–10.5)

## 2016-02-21 LAB — URINE MICROSCOPIC-ADD ON

## 2016-02-21 LAB — COMPREHENSIVE METABOLIC PANEL
ALT: 18 U/L (ref 14–54)
AST: 19 U/L (ref 15–41)
Albumin: 3.9 g/dL (ref 3.5–5.0)
Alkaline Phosphatase: 108 U/L (ref 38–126)
Anion gap: 7 (ref 5–15)
BUN: 9 mg/dL (ref 6–20)
CO2: 28 mmol/L (ref 22–32)
Calcium: 8.8 mg/dL — ABNORMAL LOW (ref 8.9–10.3)
Chloride: 103 mmol/L (ref 101–111)
Creatinine, Ser: 0.94 mg/dL (ref 0.44–1.00)
GFR calc Af Amer: >60 mL/min (ref >60)
GFR calc non Af Amer: >60 mL/min (ref >60)
Glucose, Bld: 118 mg/dL — ABNORMAL HIGH (ref 65–99)
Potassium: 3.4 mmol/L — ABNORMAL LOW (ref 3.5–5.1)
Sodium: 138 mmol/L (ref 135–145)
Total Bilirubin: 0.5 mg/dL (ref 0.3–1.2)
Total Protein: 7.6 g/dL (ref 6.5–8.1)

## 2016-02-21 LAB — URINALYSIS, ROUTINE W REFLEX MICROSCOPIC
Bilirubin Urine: NEGATIVE
Glucose, UA: NEGATIVE mg/dL
Ketones, ur: NEGATIVE mg/dL
Nitrite: NEGATIVE
Protein, ur: NEGATIVE mg/dL
Specific Gravity, Urine: 1.02 (ref 1.005–1.030)
pH: 7.5 (ref 5.0–8.0)

## 2016-02-21 LAB — LIPASE, BLOOD: Lipase: 20 U/L (ref 11–51)

## 2016-02-21 MED ORDER — SODIUM CHLORIDE 0.9 % IV BOLUS (SEPSIS)
1000.0000 mL | Freq: Once | INTRAVENOUS | Status: AC
  Administered 2016-02-21: 1000 mL via INTRAVENOUS

## 2016-02-21 NOTE — Telephone Encounter (Signed)
PLEASE NOTE: All timestamps contained within this report are represented as [place] Standard Time. CONFIDENTIALTY NOTICE: This fax transmission is intended only for the addressee. It contains information that is legally privileged, confidential or otherwise protected from use or disclosure. If you are not the intended recipient, you are strictly prohibited from reviewing, disclosing, copying using or disseminating any of this information or taking any action in reliance on or regarding this information. If you have received this fax in error, please notify us immediately by telephone so that we can arrange for its return to [place]. Phone: [phone], Toll-Free: [phone], Fax: [phone] Page: 1 of 1 Call Id: [id number] [place] [facility] - Client [place] Patient Name: Summer [name] DOB: [birth year] Initial Comment caller states she thinks she took too much Lamotrigine Nurse Assessment Nurse: Dimas Chyle, RN, Dellis Filbert Date/Time Eilene Ghazi Time): 02/21/2016 10:05:10 AM Confirm and document reason for call. If symptomatic, describe symptoms. You must click the next button to save text entered. ---Caller states she thinks she took too much Lamotrigine. Takes for trigeminal neuralgia. Took 150 mg. Has the patient traveled out of the country within the last 30 days? ---No Does the patient have any new or worsening symptoms? ---Yes Will a triage be completed? ---Yes Related visit to physician within the last 2 weeks? ---No Does the PT have any chronic conditions? (i.e. diabetes, asthma, etc.) ---Yes List chronic conditions. ---Trigeminal neuralgia Is this a behavioral health or substance abuse call? ---No Guidelines Guideline Title Affirmed Question Affirmed Notes Poisoning [1] DOUBLE DOSE (an extra dose or lesser amount) of prescription drug AND [2] any symptoms (e.g., dizziness, nausea, pain, sleepiness) Final Disposition User Call  [facility] [place], RN, [place] Disagree/Comply: Comply

## 2016-02-21 NOTE — ED Notes (Signed)
Pt directed to come to ED by Poison Control.  Pt took 150mg  lamotrigine instead of prescribed 100mg .  Pt took medication around 2300 last night.  Pt c/o n/v and multiple falls.

## 2016-02-21 NOTE — ED Notes (Addendum)
Pt has had emesis with nausea since Monday.  Pt has trigeminy neuralgia and took additional meds last night to help with pain.  Pt has also had increased falls this week.  States more weakness on left side starting over 2 days ago.

## 2016-02-21 NOTE — ED Provider Notes (Signed)
CSN: [id number]     Arrival date & time 02/21/16  1437 History   First MD Initiated Contact with Patient 02/21/16 1650     Chief Complaint  Patient presents with  . Emesis  . Fall     (Consider location/radiation/quality/duration/timing/severity/associated sxs/prior Treatment) HPI.Marland KitchenMarland KitchenMarland KitchenNausea, vomiting for 24 hours with associated weakness. She fell unexpectedly today. Past medical history includes trigeminal neuralgia. She apparently took some extra medication, i.e. 150 mg of Lamictal instead of 100 mg. She was slightly ataxic. No fever, sweats, chills, frank neurological deficits, dysuria, chest pain, dyspnea. Severity of symptoms moderate.  Past Medical History  Diagnosis Date  . Diverticulosis   . History of shingles 2012--  no residual pain  . History of kidney stones   . Left ureteral calculus   . Trigeminal neuralgia RIGHT  . Arthritis shoulders and back  . 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 Urgency of urination   . History of kidney stones   . 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 Fibromyalgia    Past Surgical History  Procedure Laterality Date  . Wisdom tooth extraction    . Resection colostomy/ closure colostomy with coloproctostomy  02-20-2007  . Exploratory laparotomy/ resection mid to distal sigmoid and proximal rectum/ end proximal sigmoid colostomy  07-17-2006    PERFORATED DIVERTICULITIS WITH PERITONITIS  . Cerebral microvascular decompression  07-05-2006    RIGHT TRIGEMINAL NERVE  . Tonsillectomy  AS CHILD  . Vaginal hysterectomy  1998    Partial  . Ureteroscopy  07/04/2012    Procedure: URETEROSCOPY;  Surgeon: Claybon Jabs, MD;  Location: [facility];  Service: Urology;  Laterality: Left;  . Cystoscopy/retrograde/ureteroscopy/stone extraction with basket  07/04/2012    Procedure: CYSTOSCOPY/RETROGRADE/URETEROSCOPY/STONE EXTRACTION WITH BASKET;  Surgeon: Claybon Jabs, MD;  Location: [facility];  Service:  Urology;  Laterality: Left;   Family History  Problem Relation Age of Onset  . Heart disease Maternal Grandmother   . Diabetes Mother   . Breast cancer Maternal Aunt   . Lupus Cousin   . Colon cancer Neg Hx   . Esophageal cancer Neg Hx   . Rectal cancer Neg Hx   . Stomach cancer Neg Hx    Social History  Substance Use Topics  . Smoking status: Never Smoker   . Smokeless tobacco: Never Used  . Alcohol Use: Yes     Comment: RARE   OB History    No data available     Review of Systems  All other systems reviewed and are negative.     Allergies  Clindamycin hcl  Home Medications   Prior to Admission medications   Medication Sig Start Date End Date Taking? Authorizing Provider  DULoxetine (CYMBALTA) 30 MG capsule Take 90 mg by mouth daily.  01/26/16  Yes Historical Provider, MD  gabapentin (NEURONTIN) 300 MG capsule Take 2 capsules (600 mg total) by mouth 4 (four) times daily. Patient taking differently: Take 600-900 mg by mouth 3 (three) times daily. Takes 900mg  in the morning, 600mg  at lunch, then 900mg  at night 02/09/16  Yes Marcial Pacas, MD  lamoTRIgine (LAMICTAL) 100 MG tablet Take 1 tablet (100 mg total) by mouth 2 (two) times daily. 02/09/16  Yes Marcial Pacas, MD  gabapentin (NEURONTIN) 300 MG capsule Take 2 capsules (600 mg total) by mouth 4 (four) times daily. Patient not taking: Reported on 02/21/2016 02/14/16   Marcial Pacas, MD  lamoTRIgine (LAMICTAL) 25 MG tablet One tablet twice a  day for 1 week then 2 tablets twice a day for 1 week then 3 tablets twice a day for 1 week then 4 tablets twice a day Patient not taking: Reported on 02/21/2016 02/09/16   Marcial Pacas, MD  nystatin (MYCOSTATIN) 100000 UNIT/ML suspension Take 5 mLs (500,000 Units total) by mouth 4 (four) times daily. Patient not taking: Reported on 02/21/2016 02/02/16   Biagio Borg, MD  predniSONE (DELTASONE) 10 MG tablet 3 tabs by mouth per day for 3 days,2tabs per day for 3 days,1tab per day for 3 days Patient not  taking: Reported on 02/21/2016 02/02/16   Biagio Borg, MD   BP 145/67 mmHg  Pulse 74  Temp(Src) 99.2 F (37.3 C) (Oral)  Resp 16  SpO2 100% Physical Exam  Constitutional: She is oriented to person, place, and time. She appears well-developed and well-nourished.  HENT:  Head: Normocephalic and atraumatic.  Eyes: Conjunctivae and EOM are normal. Pupils are equal, round, and reactive to light.  Neck: Normal range of motion. Neck supple.  Cardiovascular: Normal rate and regular rhythm.   Pulmonary/Chest: Effort normal and breath sounds normal.  Abdominal: Soft. Bowel sounds are normal.  Musculoskeletal: Normal range of motion.  Neurological: She is alert and oriented to person, place, and time.  Gait is slow, but no gross neurological deficits.  Skin: Skin is warm and dry.  Psychiatric: She has a normal mood and affect. Her behavior is normal.  Nursing note and vitals reviewed.   ED Course  Procedures (including critical care time) Labs Review Labs Reviewed  COMPREHENSIVE METABOLIC PANEL - Abnormal; Notable for the following:    Potassium 3.4 (*)    Glucose, Bld 118 (*)    Calcium 8.8 (*)    All other components within normal limits  CBC - Abnormal; Notable for the following:    Hemoglobin 11.8 (*)    All other components within normal limits  URINALYSIS, ROUTINE W REFLEX MICROSCOPIC (NOT AT [facility]) - Abnormal; Notable for the following:    APPearance CLOUDY (*)    Hgb urine dipstick MODERATE (*)    Leukocytes, UA TRACE (*)    All other components within normal limits  URINE MICROSCOPIC-ADD ON - Abnormal; Notable for the following:    Squamous Epithelial / LPF 0-5 (*)    Bacteria, UA MANY (*)    All other components within normal limits  LIPASE, BLOOD    Imaging Review Ct Head Wo Contrast  02/21/2016  CLINICAL DATA:  Ataxia. EXAM: CT HEAD WITHOUT CONTRAST TECHNIQUE: Contiguous axial images were obtained from the base of the skull through the vertex without intravenous  contrast. COMPARISON:  12/18/2005 MRI FINDINGS: No evidence for acute hemorrhage, mass lesion, midline shift, hydrocephalus or large infarct. Prior craniotomy in the right occipital region. Visualized paranasal sinuses are clear. No acute bone abnormality. IMPRESSION: No acute intracranial abnormality. Electronically Signed   By: Markus Daft M.D.   On: 02/21/2016 18:35   I have personally reviewed and evaluated these images and lab results as part of my medical decision-making.   EKG Interpretation None      MDM   Final diagnoses:  Trigeminal neuralgia of right side of face    Patient is feeling much better at discharge. She has no urinary symptoms. Will culture urine. Patient may have had a reaction to the extra Lamictal she took.  Nat Christen, MD 02/21/16 2158

## 2016-02-21 NOTE — ED Notes (Signed)
Pt cannot use restroom at this time, aware urine specimen is needed.  

## 2016-02-21 NOTE — Discharge Instructions (Signed)
Test showed no life-threatening condition. Follow-up your primary care doctor

## 2016-02-22 ENCOUNTER — Ambulatory Visit: Payer: Federal, State, Local not specified - PPO | Admitting: Family

## 2016-02-23 ENCOUNTER — Ambulatory Visit (INDEPENDENT_AMBULATORY_CARE_PROVIDER_SITE_OTHER): Payer: Federal, State, Local not specified - PPO | Admitting: Family

## 2016-02-23 ENCOUNTER — Encounter: Payer: Self-pay | Admitting: Family

## 2016-02-23 ENCOUNTER — Other Ambulatory Visit (INDEPENDENT_AMBULATORY_CARE_PROVIDER_SITE_OTHER): Payer: Federal, State, Local not specified - PPO

## 2016-02-23 ENCOUNTER — Telehealth: Payer: Self-pay | Admitting: Neurology

## 2016-02-23 VITALS — BP 118/58 | HR 97 | Temp 98.0°F | Ht 66.0 in | Wt 249.4 lb

## 2016-02-23 DIAGNOSIS — E876 Hypokalemia: Secondary | ICD-10-CM

## 2016-02-23 DIAGNOSIS — R413 Other amnesia: Secondary | ICD-10-CM | POA: Diagnosis not present

## 2016-02-23 DIAGNOSIS — R399 Unspecified symptoms and signs involving the genitourinary system: Secondary | ICD-10-CM | POA: Diagnosis not present

## 2016-02-23 LAB — BASIC METABOLIC PANEL
BUN: 8 mg/dL (ref 6–23)
CO2: 32 mEq/L (ref 19–32)
Calcium: 9.5 mg/dL (ref 8.4–10.5)
Chloride: 102 mEq/L (ref 96–112)
Creatinine, Ser: 0.94 mg/dL (ref 0.40–1.20)
GFR: 77.38 mL/min (ref >60.00)
Glucose, Bld: 122 mg/dL — ABNORMAL HIGH (ref 70–99)
Potassium: 3.2 mEq/L — ABNORMAL LOW (ref 3.5–5.1)
Sodium: 139 mEq/L (ref 135–145)

## 2016-02-23 LAB — CBC WITH DIFFERENTIAL/PLATELET
Basophils Absolute: 0 10*3/uL (ref 0.0–0.1)
Basophils Relative: 0 % (ref 0.0–3.0)
Eosinophils Absolute: 0.1 10*3/uL (ref 0.0–0.7)
Eosinophils Relative: 1.5 % (ref 0.0–5.0)
HCT: 38 % (ref 36.0–46.0)
Hemoglobin: 12.1 g/dL (ref 12.0–15.0)
Lymphocytes Relative: 16.1 % (ref 12.0–46.0)
Lymphs Abs: 1.4 10*3/uL (ref 0.7–4.0)
MCHC: 31.8 g/dL (ref 30.0–36.0)
MCV: 83.3 fl (ref 78.0–100.0)
Monocytes Absolute: 0.2 10*3/uL (ref 0.1–1.0)
Monocytes Relative: 1.9 % — ABNORMAL LOW (ref 3.0–12.0)
Neutro Abs: 7.1 10*3/uL (ref 1.4–7.7)
Neutrophils Relative %: 80.5 % — ABNORMAL HIGH (ref 43.0–77.0)
Platelets: 333 10*3/uL (ref 150.0–400.0)
RBC: 4.56 Mil/uL (ref 3.87–5.11)
RDW: 15.5 % (ref 11.5–15.5)
WBC: 8.8 10*3/uL (ref 4.0–10.5)

## 2016-02-23 MED ORDER — CIPROFLOXACIN HCL 250 MG PO TABS
250.0000 mg | ORAL_TABLET | Freq: Two times a day (BID) | ORAL | Status: DC
Start: 2016-02-23 — End: 2016-02-27

## 2016-02-23 MED ORDER — POTASSIUM CHLORIDE CRYS ER 20 MEQ PO TBCR: 20.0000 meq | EXTENDED_RELEASE_TABLET | Freq: Every day | ORAL | Status: DC

## 2016-02-23 NOTE — Telephone Encounter (Signed)
Pt was seen in ED on 02/21/16 d/t N/V and fall. Then, went to PCP on 02/23/16 w/ UTI, memory loss, sluggishness and hypokalemia. Lamictal was helping w/ facial pain but was d/c'd b/c of possible side effects.

## 2016-02-23 NOTE — Telephone Encounter (Signed)
Pt called with the son on the phone also said she is having problems walking and has fallen about 3-4 times. Pt thinks it is caused by lamoTRIgine (LAMICTAL) 100 MG tablet . Son reports she has some memory problems. Dr Vidal Schwalbe took her off of lamotrigine today. Son said she is sleeping a lot. Son says she has been in this condition for a few days and has missed a week of work. Please call .

## 2016-02-23 NOTE — Patient Instructions (Addendum)
Prescription sent for urinary tract infection. Lab work.  Start K-Dur until follow up appt. Please make this out front for Monday with me.  Stop Lamictal at this time. I suspect it has caused this confusion and personality change.  Patient is NOT safe to be alone, as we agreed upon today, he will stay with the patient until she less confused and back to her normal personality- if this cannot occur, you must take her to the ED.   If there is no improvement in your symptoms, or if there is any worsening of symptoms, or if you have any additional concerns, please return for re-evaluation; or, if we are closed, consider going to the Emergency Room for evaluation if symptoms urgent.

## 2016-02-23 NOTE — Progress Notes (Signed)
Pre visit review using our clinic review tool, if applicable. No additional management support is needed unless otherwise documented below in the visit note. 

## 2016-02-23 NOTE — Progress Notes (Signed)
Subjective:    Patient ID: Summer [name], female    DOB: 02-27-1951, 65 y.o.   MRN: [id number]   Summer [name] is a 65 y.o. female who presents today for an acute visit.    HPI Comments: Patient called [facility] in triage today complaining of dizziness and concern that she's taken too much Lamictal. Accompanied by friend today who does most of the talking. Friend states symptoms started to worsen 4 days ago however started when she started Lamictal. She took 150mg  last night and states it is helping with her facial pain.    Per chart review, patient has long history of trigeminal neuralgia, chronic facial pain and follows with neurology. Recently started on Lamictal with improvement of facial pain.She also take gabapentin for pain. It appears patient started Lamictal on 6/1. She says to take 1 tablet twice a day for one week and then increase to 2 tablets and so forth for titration.   Per chart review, patient was seen in the emergency room 6/13 for nausea, vomiting and a fall. She took 150 mg of Lamictal and then of 100 and was slightly ataxic.CT Head negative 6/13. UA showed trace leukocytes, negative nitrites. Culture revealed many bacteria. She has not been treated for this.       Past Medical History  Diagnosis Date  . Diverticulosis   . History of shingles 2012--  no residual pain  . History of kidney stones   . Left ureteral calculus   . Trigeminal neuralgia RIGHT  . Arthritis shoulders and back  . 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 Urgency of urination   . History of kidney stones   . 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 Fibromyalgia    Allergies: Clindamycin hcl Current Outpatient Prescriptions on File Prior to Visit  Medication Sig Dispense Refill  . DULoxetine (CYMBALTA) 30 MG capsule Take 90 mg by mouth daily.     Marland Kitchen gabapentin (NEURONTIN) 300 MG capsule Take 2 capsules (600 mg total) by mouth 4 (four) times daily. (Patient taking differently:  Take 600-900 mg by mouth 3 (three) times daily. Takes 900mg  in the morning, 600mg  at lunch, then 900mg  at night) 240 capsule 11  . lamoTRIgine (LAMICTAL) 100 MG tablet Take 1 tablet (100 mg total) by mouth 2 (two) times daily. 60 tablet 11  . nystatin (MYCOSTATIN) 100000 UNIT/ML suspension Take 5 mLs (500,000 Units total) by mouth 4 (four) times daily. (Patient not taking: Reported on 02/21/2016) 60 mL 2  . predniSONE (DELTASONE) 10 MG tablet 3 tabs by mouth per day for 3 days,2tabs per day for 3 days,1tab per day for 3 days (Patient not taking: Reported on 02/21/2016) 18 tablet 0   No current facility-administered medications on file prior to visit.    Social History  Substance Use Topics  . Smoking status: Never Smoker   . Smokeless tobacco: Never Used  . Alcohol Use: Yes     Comment: RARE    Review of Systems  Constitutional: Negative for fever and chills.  Respiratory: Negative for cough.   Cardiovascular: Negative for chest pain and palpitations.  Gastrointestinal: Negative for nausea and vomiting.  Genitourinary: Negative for dysuria and frequency.  Neurological: Positive for dizziness. Negative for syncope.      Objective:    BP 118/58 mmHg  Pulse 97  Temp(Src) 98 F (36.7 C) (Oral)  Ht 5\' 6"  (1.676 m)  Wt 249 lb 6 oz (113.116 kg)  BMI 40.27 kg/m2  SpO2 96%   Physical Exam  Constitutional: She appears well-developed and well-nourished.  HENT:  Mouth/Throat: Uvula is midline, oropharynx is clear and moist and mucous membranes are normal.  Eyes: Conjunctivae and EOM are normal. Pupils are equal, round, and reactive to light.     Cardiovascular: Normal rate, regular rhythm, normal heart sounds and normal pulses.   Pulmonary/Chest: Effort normal and breath sounds normal. She has no wheezes. She has no rhonchi. She has no rales.  Abdominal: There is no CVA tenderness.  Neurological: She has normal strength. No cranial nerve deficit or sensory deficit. She displays a  negative Romberg sign.  Reflex Scores:      Bicep reflexes are 2+ on the right side and 2+ on the left side.      Patellar reflexes are 2+ on the right side and 2+ on the left side. Able to respond appropriately to questions regarding year, location, date of birth however very slow to answer. Grip equal and strong bilateral upper extremities. Gait unsteady. Speech slow.   Skin: Skin is warm and dry.  Psychiatric: She has a normal mood and affect. Her speech is normal and behavior is normal. Thought content normal.  Vitals reviewed.      Assessment & Plan:  1. Urinary tract infection symptoms Untreated after ED. No fever, nausea, vomiting, CVA tenderness to suggest pyelonephritis or urosepsis at this time. Patient was unable to urinate in our office after given water. GFR today WNL.   - ciprofloxacin (CIPRO) 250 MG tablet; Take 1 tablet (250 mg total) by mouth 2 (two) times daily.  Dispense: 6 tablet; Refill: 0  2. Memory loss/ Sluggishness Patient appears unsteady with a blunt affect, and sluggish response however as she was in office for 2 hours, her sluggishness and affect improved. She also became more talkative.  She is able to answer appropriately, although slow, to questions which is reassuring. She is able to follow commands and her vitals are stable. Also reassuring, when I mentioned stopping Lamictal, Summer [name] got slightly agitated and she stated this is helping with her facial pain. She also stated that she wants to be at work tomorrow.  I have concerns that the friend is describing in terms of her confusion and inability to get in her apartment. The patient lives with her son and her friend checks on her multiple times a day. He has reassured me that she will not be left alone as I'm concerned for falls or concerned that she may take more Lamictal.   CBC normal.   We agree on very close observation of patient and her not being left alone. In this context, I am willing to let her  stay at home and not go to the emergency room. However if her mental status acutely changes or someone cannot be with her until this situation resolves, she must get emergency room. I have sent a message to neurologist to manage Lamictal AE.  - Lamotrigine level - CBC with Differential/Platelet; Future -CMET -Stop lamictical  3. Hypokalemia Allergy unclear at this point. Patient had no diarrhea or vomiting. Does not appear to be side effect of any medications that she is on period. Recheck level next week after starting K-Dur.     I am having Summer. Onofrio maintain her nystatin, predniSONE, DULoxetine, gabapentin, and lamoTRIgine.   No orders of the defined types were placed in this encounter.     Start medications as prescribed and explained to patient on After Visit Summary (  AVS). Risks, benefits, and alternatives of the medications and treatment plan prescribed today were discussed, and patient expressed understanding.   Education regarding symptom management and diagnosis given to patient.   Follow-up:Plan follow-up and return precautions given if any worsening symptoms or change in condition.   Continue to follow with Hoyt Koch, MD for routine health maintenance.   Summer [name] and I agreed with plan.   Mable Paris, FNP

## 2016-02-24 ENCOUNTER — Telehealth: Payer: Self-pay | Admitting: Family

## 2016-02-24 ENCOUNTER — Ambulatory Visit: Payer: Federal, State, Local not specified - PPO | Admitting: Family

## 2016-02-24 NOTE — Telephone Encounter (Signed)
Left message to see if patient was feeling better today. Gave her [facility] number to call back and advised her to call if any questions, new or worsening symptoms. I also advised that she could come to Saturday clinic tomorrow if she needed too.

## 2016-02-24 NOTE — Telephone Encounter (Signed)
Move up her follow up app with me at next available.

## 2016-02-27 ENCOUNTER — Ambulatory Visit (INDEPENDENT_AMBULATORY_CARE_PROVIDER_SITE_OTHER): Payer: Federal, State, Local not specified - PPO | Admitting: Family

## 2016-02-27 ENCOUNTER — Other Ambulatory Visit (INDEPENDENT_AMBULATORY_CARE_PROVIDER_SITE_OTHER): Payer: Federal, State, Local not specified - PPO

## 2016-02-27 ENCOUNTER — Encounter: Payer: Self-pay | Admitting: Family

## 2016-02-27 VITALS — BP 122/72 | HR 81 | Temp 98.5°F | Ht 66.0 in | Wt 243.2 lb

## 2016-02-27 DIAGNOSIS — R109 Unspecified abdominal pain: Secondary | ICD-10-CM

## 2016-02-27 DIAGNOSIS — R1011 Right upper quadrant pain: Secondary | ICD-10-CM | POA: Diagnosis not present

## 2016-02-27 DIAGNOSIS — R3989 Other symptoms and signs involving the genitourinary system: Secondary | ICD-10-CM | POA: Diagnosis not present

## 2016-02-27 LAB — CBC WITH DIFFERENTIAL/PLATELET
Basophils Absolute: 0 10*3/uL (ref 0.0–0.1)
Basophils Relative: 0.1 % (ref 0.0–3.0)
Eosinophils Absolute: 0.2 10*3/uL (ref 0.0–0.7)
Eosinophils Relative: 2.1 % (ref 0.0–5.0)
HCT: 39.6 % (ref 36.0–46.0)
Hemoglobin: 12.7 g/dL (ref 12.0–15.0)
Lymphocytes Relative: 22.3 % (ref 12.0–46.0)
Lymphs Abs: 1.9 10*3/uL (ref 0.7–4.0)
MCHC: 32 g/dL (ref 30.0–36.0)
MCV: 82.7 fl (ref 78.0–100.0)
Monocytes Absolute: 0.3 10*3/uL (ref 0.1–1.0)
Monocytes Relative: 3.6 % (ref 3.0–12.0)
Neutro Abs: 6.2 10*3/uL (ref 1.4–7.7)
Neutrophils Relative %: 71.9 % (ref 43.0–77.0)
Platelets: 366 10*3/uL (ref 150.0–400.0)
RBC: 4.79 Mil/uL (ref 3.87–5.11)
RDW: 15.3 % (ref 11.5–15.5)
WBC: 8.6 10*3/uL (ref 4.0–10.5)

## 2016-02-27 LAB — LIPASE: Lipase: 9 U/L — ABNORMAL LOW (ref 11.0–59.0)

## 2016-02-27 LAB — COMPREHENSIVE METABOLIC PANEL
ALT: 12 U/L (ref 0–35)
AST: 14 U/L (ref 0–37)
Albumin: 4.1 g/dL (ref 3.5–5.2)
Alkaline Phosphatase: 115 U/L (ref 39–117)
BUN: 11 mg/dL (ref 6–23)
CO2: 30 mEq/L (ref 19–32)
Calcium: 9.6 mg/dL (ref 8.4–10.5)
Chloride: 101 mEq/L (ref 96–112)
Creatinine, Ser: 0.87 mg/dL (ref 0.40–1.20)
GFR: 84.61 mL/min (ref >60.00)
Glucose, Bld: 97 mg/dL (ref 70–99)
Potassium: 3.9 mEq/L (ref 3.5–5.1)
Sodium: 140 mEq/L (ref 135–145)
Total Bilirubin: 0.6 mg/dL (ref 0.2–1.2)
Total Protein: 7.8 g/dL (ref 6.0–8.3)

## 2016-02-27 LAB — LAMOTRIGINE LEVEL: Lamotrigine Lvl: 15.6 ug/mL (ref 4.0–18.0)

## 2016-02-27 NOTE — Patient Instructions (Addendum)
Please continue to follow with neurologist regarding facial pain and Lamictal.  We'll call you with all results .   If there is no improvement in your symptoms, or if there is any worsening of symptoms, or if you have any additional concerns, please return for re-evaluation; or, if we are closed, consider going to the Emergency Room for evaluation if symptoms urgent.

## 2016-02-27 NOTE — Telephone Encounter (Signed)
Spoke to patient - her appt has been moved to this week.

## 2016-02-27 NOTE — Progress Notes (Signed)
Pre visit review using our clinic review tool, if applicable. No additional management support is needed unless otherwise documented below in the visit note. 

## 2016-02-27 NOTE — Progress Notes (Signed)
Subjective:    Patient ID: Summer [name], female    DOB: 16-Nov-1950, 65 y.o.   MRN: [id number]   Summer [name] is a 65 y.o. female who presents today for an acute visit.    HPI Comments: Patient here for follow up after confusion and suspected medication adverse effect of lamictal. Accompanied friend who was with her last week. She states her memory loss is improving and she feels more "together".   Patient here for new complaint describing right low abdominal pain which radiates to right low back started 2 weeks ago, worsening. No pain right now being still. 'Screaming pain when it occurs.' This coincides with the fall which she was seen in the emergency room 6/13. Pain is worse when getting up and down and when turns. H/o diverticulitis with bowel removal s/p temporary ostomy. BM today after Miralax. No numbness, tingling, weakness. No nausea, vomiting.   Complains of incomplete bladder emptying for a month, unchanged. No dysuria, frequency.     Past Medical History  Diagnosis Date  . Diverticulosis   . History of shingles 2012--  no residual pain  . History of kidney stones   . Left ureteral calculus   . Trigeminal neuralgia RIGHT  . Arthritis shoulders and back  . 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 Urgency of urination   . History of kidney stones   . 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 Fibromyalgia    Allergies: Clindamycin hcl Current Outpatient Prescriptions on File Prior to Visit  Medication Sig Dispense Refill  . DULoxetine (CYMBALTA) 30 MG capsule Take 90 mg by mouth daily.     Marland Kitchen gabapentin (NEURONTIN) 300 MG capsule Take 2 capsules (600 mg total) by mouth 4 (four) times daily. (Patient taking differently: Take 600-900 mg by mouth 3 (three) times daily. Takes 900mg  in the morning, 600mg  at lunch, then 900mg  at night) 240 capsule 11  . potassium chloride SA (K-DUR,KLOR-CON) 20 MEQ tablet Take 1 tablet (20 mEq total) by mouth daily. 10 tablet 0    . lamoTRIgine (LAMICTAL) 100 MG tablet Take 1 tablet (100 mg total) by mouth 2 (two) times daily. (Patient not taking: Reported on 02/27/2016) 60 tablet 11   No current facility-administered medications on file prior to visit.    Social History  Substance Use Topics  . Smoking status: Never Smoker   . Smokeless tobacco: Never Used  . Alcohol Use: Yes     Comment: RARE    Review of Systems  Constitutional: Negative for fever and chills.  Respiratory: Negative for cough.   Cardiovascular: Negative for chest pain and palpitations.  Gastrointestinal: Positive for abdominal pain. Negative for nausea, vomiting and abdominal distention.      Objective:    BP 122/72 mmHg  Pulse 81  Temp(Src) 98.5 F (36.9 C) (Oral)  Ht 5\' 6"  (1.676 m)  Wt 243 lb 4 oz (110.337 kg)  BMI 39.28 kg/m2  SpO2 96%   Physical Exam  Constitutional: She appears well-developed and well-nourished.  Eyes: Conjunctivae are normal.  Cardiovascular: Normal rate, regular rhythm, normal heart sounds and normal pulses.   Pulmonary/Chest: Effort normal and breath sounds normal. She has no wheezes. She has no rhonchi. She has no rales.  Neurological: She is alert.  Skin: Skin is warm and dry.  Psychiatric: She has a normal mood and affect. Her speech is normal and behavior is normal. Thought content normal.  Vitals reviewed.      Assessment &  Plan:  1. Urinary problem Concern for urinary stricture. - Ambulatory referral to Urology  2. RUQ abdominal pain Concern for abdominal etiology. History of diverticulitis with constipation. Pending workup. Patient has dental appointment this afternoon and politely declined CT of abdomen today- scheduled for tomorrow . - CT Abdomen Pelvis W Contrast; Future - Comprehensive metabolic panel; Future - CBC with Differential/Platelet; Future - Lipase; Future  3. Right flank pain Nonspecific. Suspect muscular skeletal etiology after fall if abdominal workout is  benign.  Reassured as patient is not confused today, speaking is not sluggish. She is alert and oriented. Lamictal level normal. Per chart review, she is reshot her neurologist and now has an appointment 6/22. Advised patient to follow-up neurologist regarding restarting Lamictal and what dose.   I have discontinued Ms. Henkels's ciprofloxacin. I am also having her maintain her DULoxetine, gabapentin, lamoTRIgine, and potassium chloride SA.   No orders of the defined types were placed in this encounter.     Start medications as prescribed and explained to patient on After Visit Summary ( AVS). Risks, benefits, and alternatives of the medications and treatment plan prescribed today were discussed, and patient expressed understanding.   Education regarding symptom management and diagnosis given to patient.   Follow-up:Plan follow-up and return precautions given if any worsening symptoms or change in condition.   Continue to follow with Hoyt Koch, MD for routine health maintenance.   Summer [name] and I agreed with plan.   Mable Paris, FNP

## 2016-02-28 ENCOUNTER — Ambulatory Visit (INDEPENDENT_AMBULATORY_CARE_PROVIDER_SITE_OTHER)
Admission: RE | Admit: 2016-02-28 | Discharge: 2016-02-28 | Disposition: A | Discharge status: Home / Self Care | Payer: Federal, State, Local not specified - PPO | Source: Ambulatory Visit | Attending: Family | Admitting: Family

## 2016-02-28 DIAGNOSIS — R1011 Right upper quadrant pain: Secondary | ICD-10-CM | POA: Diagnosis not present

## 2016-02-28 MED ORDER — IOPAMIDOL (ISOVUE-300) INJECTION 61%
100.0000 mL | Freq: Once | INTRAVENOUS | Status: AC | PRN
Start: 2016-02-28 — End: 2016-02-28
  Administered 2016-02-28: 100 mL via INTRAVENOUS

## 2016-02-28 NOTE — Progress Notes (Signed)
I have reviewed and agreed above plan. 

## 2016-03-01 ENCOUNTER — Encounter: Payer: Self-pay | Admitting: Neurology

## 2016-03-01 ENCOUNTER — Ambulatory Visit (INDEPENDENT_AMBULATORY_CARE_PROVIDER_SITE_OTHER): Payer: Federal, State, Local not specified - PPO | Admitting: Neurology

## 2016-03-01 ENCOUNTER — Telehealth: Payer: Self-pay

## 2016-03-01 VITALS — BP 148/71 | HR 100 | Ht 66.0 in | Wt 247.0 lb

## 2016-03-01 DIAGNOSIS — G500 Trigeminal neuralgia: Secondary | ICD-10-CM

## 2016-03-01 DIAGNOSIS — R51 Headache: Secondary | ICD-10-CM

## 2016-03-01 DIAGNOSIS — R519 Headache, unspecified: Secondary | ICD-10-CM

## 2016-03-01 MED ORDER — LAMOTRIGINE 100 MG PO TABS: 100.0000 mg | ORAL_TABLET | Freq: Two times a day (BID) | ORAL | Status: DC

## 2016-03-01 NOTE — Telephone Encounter (Signed)
LVM for pt to call back as soon as possible.   

## 2016-03-01 NOTE — Telephone Encounter (Signed)
Patient called and wanted to know if you had any new information about if she needs to go to a urology. Please advise or follow up

## 2016-03-01 NOTE — Progress Notes (Signed)
Chief Complaint  Patient presents with  . Trigeminal Neuralgia    Says she is currently taking gabapentin (900mg  in am, 600mg  midday, 900mg  qhs).  She was taken off Lamictal, by her PCP, because is was thought to be causing falls, confusion and tremors.  Currently, her right-sided facial pain is under control.     [facility]  PATIENT: Summer [name] DOB: Apr 03, 1951   REASON FOR VISIT: Follow-up for right trigeminal neuralgia HISTORY FROM: Patient    HISTORY OF PRESENT ILLNESS: HISTORYMs. [name] is a 65 years old right-handed African American female, referred by her primary care physician Dr. Stevie Kern for evaluation of right facial pain  She was diagnosed with right trigeminal neuralgia in 2006, presenting with severe electronics shooting pain at her right upper molar, she had microvascular decompression surgery at [facility] by Dr. Harrison Mons around 2006, which has helped the severe pain, but she continued to have annoying almost constant pain at her right face, she describes hot cold sensitivity over right face, touching of right forehead region will also set of her right facial pain, she felt right tongue swelling, she bites on her right tongue sometimes, she works at customer service, on the phone many hours each day, with prolonged talking, she felt sandpaper was rubbing against her right tongue, also complains of right upper and lower lip numbness, intermittent electronics shooting pain,  Over the years, she has tried different medication, was evaluated by our office Dr. Brett Fairy in 2008, she has tried gabapentin, Lyrica, Cymbalta, Trileptal, currently is taking Tegretol 200 mg twice a day, initially it helps her some, but failed to help work consistently, She denied visual change, recent few months of left lower back, left hip pain, radiating to left posterior thigh, to her left leg and left foot, difficulty walking after prolonged sitting She  complains of left low back pain, left hip pain, radiating pain to left foot, like walking on a dead, burning sensation, she continued to complains of right facial constant achy pain, there was no significant change after started on Oxtellar xR 300 mg every day, no significant side effects,  I have reviewed MRI, mild small vessel disease, no significant abnormality otherwise,  She had MRI lumbar spine (without) demonstrating: L5-S1: facet hypertrophy with moderate right and severe left foraminal stenosis  L4-5: pseudo disc bulging and facet hypertrophy with moderate right and mild left foraminal stenosis. There is 2 mm anterior spondylolisthesis of L4 on L5.  Degenerative spondylosis and disc bulging L1-2 and L4-5.  UPDATE Sep 30 2014: She felt sand paper sensation in her right tongue, flashing sensation, last 1-2 weeks, only take gabapentin 300mg  tid now. She also complains of snoring, frequent wakening during the nighttime, excessive sleepiness at daytime, F ESS score is 37, ESS is 9  UPDATE 10/06/2015 Summer [name], 65 year old female returns for follow-up. She has a history of right trigeminal neuralgia is currently on Neurontin 300 mg 3 times a day has breakthrough pain. 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 She occasionally will have upper and lower lip numbness. She returns for reevaluation  UPDATE February 09 2016: She complained increased right facial pain since April 2017, involving right V1/V2 branch, she is taking gabapentin 300 mg 4 times a day, only helped her mildly, triggered by wind blow on her right face, talking, chewing, she works as a Restaurant manager, fast food, it is  very difficult for her to perform her job,  Update March 01 2016: She presented to the emergency room February 21 2016 for nausea, unexpected fall, night before that because severe facial pain, she took extra dose of lamotrigine, made it  to 250 mg that particular day, laboratory evaluation showed mildly low potassium of CMP WAS GIVEN SUPPLEMENT, NORMAL CBC, lamotrigine level in June 15 was 15.6,  For a while, she had severe right facial pain, was taking gabapentin 300 mg 2 tablets every 2 hours, 3 tablets at night, about 11 tablets each day 3300 mg every day plus lamotrigine 100 mg twice a day,   Lamotrigine 100 mg twice a day has been very helpful, it has taking care of her long bothered right tongue sandpaper sensation. She is now taking gabapentin 300 mg 3/2/3 tablets each day, she has been off lamotrigine since February 26 2016, the sandpaper sensation on the right side of the tongue came back, she is now taking Cymbalta 30 mg 3 times a day  She has been complains 3 weeks history of urinary urgency, difficulty initiating urine, UA showed mild UTI, was treated with Cipro without improving her symptoms  REVIEW OF SYSTEMS: Full 14 system review of systems performed and notable only for those listed, all others are neg: Blurred vision, fatigue    ALLERGIES: Allergies  Allergen Reactions  . Clindamycin Hcl Itching    itch    HOME MEDICATIONS: Outpatient Prescriptions Prior to Visit  Medication Sig Dispense Refill  . DULoxetine (CYMBALTA) 30 MG capsule Take 90 mg by mouth daily.     Marland Kitchen gabapentin (NEURONTIN) 300 MG capsule Take 2 capsules (600 mg total) by mouth 4 (four) times daily. (Patient taking differently: Take 600-900 mg by mouth 3 (three) times daily. Takes 900mg  in the morning, 600mg  at lunch, then 900mg  at night) 240 capsule 11  . lamoTRIgine (LAMICTAL) 100 MG tablet Take 1 tablet (100 mg total) by mouth 2 (two) times daily. 60 tablet 11  . potassium chloride SA (K-DUR,KLOR-CON) 20 MEQ tablet Take 1 tablet (20 mEq total) by mouth daily. 10 tablet 0   No facility-administered medications prior to visit.    PAST MEDICAL HISTORY: Past Medical History  Diagnosis Date  . Diverticulosis   . History of shingles 2012--   no residual pain  . History of kidney stones   . Left ureteral calculus   . Trigeminal neuralgia RIGHT  . Arthritis shoulders and back  . 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 Urgency of urination   . History of kidney stones   . 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 Fibromyalgia     PAST SURGICAL HISTORY: Past Surgical History  Procedure Laterality Date  . Wisdom tooth extraction    . Resection colostomy/ closure colostomy with coloproctostomy  02-20-2007  . Exploratory laparotomy/ resection mid to distal sigmoid and proximal rectum/ end proximal sigmoid colostomy  07-17-2006    PERFORATED DIVERTICULITIS WITH PERITONITIS  . Cerebral microvascular decompression  07-05-2006    RIGHT TRIGEMINAL NERVE  . Tonsillectomy  AS CHILD  . Vaginal hysterectomy  1998    Partial  . Ureteroscopy  07/04/2012    Procedure: URETEROSCOPY;  Surgeon: Claybon Jabs, MD;  Location: [facility];  Service: Urology;  Laterality: Left;  . Cystoscopy/retrograde/ureteroscopy/stone extraction with basket  07/04/2012    Procedure: CYSTOSCOPY/RETROGRADE/URETEROSCOPY/STONE EXTRACTION WITH BASKET;  Surgeon: Claybon Jabs, MD;  Location: [facility];  Service: Urology;  Laterality:  Left;    FAMILY HISTORY: Family History  Problem Relation Age of Onset  . Heart disease Maternal Grandmother   . Diabetes Mother   . Breast cancer Maternal Aunt   . Lupus Cousin   . Colon cancer Neg Hx   . Esophageal cancer Neg Hx   . Rectal cancer Neg Hx   . Stomach cancer Neg Hx     SOCIAL HISTORY: Social History   Social History  . Marital Status: Married    Spouse Name: N/A  . Number of Children: 1  . Years of Education: College   Occupational History  .    Marland Kitchen [employer]  [employer]   Social History Main Topics  . Smoking status: Never Smoker   . Smokeless tobacco: Never Used  . Alcohol Use: Yes     Comment: RARE  . Drug Use: No  . Sexual Activity: Not on  file   Other Topics Concern  . Not on file   Social History Narrative     PHYSICAL EXAM  Filed Vitals:   03/01/16 0955  BP: 148/71  Pulse: 100  Height: 5\' 6"  (1.676 m)  Weight: 247 lb (112.038 kg)   Body mass index is 39.89 kg/(m^2).   PHYSICAL EXAMNIATION:  Gen: NAD, conversant, well nourised, obese, well groomed                     Cardiovascular: Regular rate rhythm, no peripheral edema, warm, nontender. Eyes: Conjunctivae clear without exudates or hemorrhage Neck: Supple, no carotid bruise. Pulmonary: Clear to auscultation bilaterally   NEUROLOGICAL EXAM:  MENTAL STATUS:Mild distress Speech:    Speech is normal; fluent and spontaneous with normal comprehension.  Cognition:     Orientation to time, place and person     Normal recent and remote memory     Normal Attention span and concentration     Normal Language, naming, repeating,spontaneous speech     Fund of knowledge   CRANIAL NERVES: CN II: Visual fields are full to confrontation. Fundoscopic exam is normal with sharp discs and no vascular changes. Pupils are round equal and briskly reactive to light. CN III, IV, VI: extraocular movement are normal. No ptosis. CN V: Facial sensation is intact to pinprick in all 3 divisions bilaterally. Corneal responses are intact.  CN VII: Face is symmetric with normal eye closure and smile. CN VIII: Hearing is normal to rubbing fingers CN IX, X: Palate elevates symmetrically. Phonation is normal. CN XI: Head turning and shoulder shrug are intact CN XII: Tongue is midline with normal movements and no atrophy.  MOTOR: There is no pronator drift of out-stretched arms. Muscle bulk and tone are normal. Muscle strength is normal.  REFLEXES: Reflexes are 2+ and symmetric at the biceps, triceps, knees, and ankles. Plantar responses are flexor.  SENSORY: Intact to light touch, pinprick, positional and vibratory sensation are intact in fingers and  toes.  COORDINATION: Rapid alternating movements and fine finger movements are intact. There is no dysmetria on finger-to-nose and heel-knee-shin.    GAIT/STANCE: Posture is normal. Gait is steady with normal steps, base, arm swing, and turning. Heel and toe walking are normal. Tandem gait is normal.  Romberg is absent.     DIAGNOSTIC DATA (LABS, IMAGING, TESTING) -  ASSESSMENT AND PLAN 65 years old [place] American female, with long-standing history of right trigeminal neuralgia, had a history of microvascular decompression surgery by Dr. Redmond Pulling in 2006 at [facility], at baseline, she had  mild persistent symptoms, she has tried multiple different medications in the past, including Tegretol, Trileptal, Neurontin, Lyrica, and Cymbalta, Oxtellar with limited result  Recurrent right trigeminal neuralgia since April 2017  keep lamotrigine 100 mg twice a day  May gradually decrease gabapentin to lower dose  RTC in 3 months  Marcial Pacas, M.D. Ph.D.  [facility] [place], [place] [address] Phone: [phone] Fax:      [phone]

## 2016-03-02 NOTE — Telephone Encounter (Signed)
Contact pt and informed that [facility] will be calling for the appt.

## 2016-03-06 ENCOUNTER — Other Ambulatory Visit: Payer: Federal, State, Local not specified - PPO

## 2016-03-14 ENCOUNTER — Telehealth: Payer: Self-pay | Admitting: *Deleted

## 2016-03-14 NOTE — Telephone Encounter (Signed)
Received notes from [facility] Dr. Oneita Kras (Radiation Oncology).  Placed in in box.

## 2016-03-21 ENCOUNTER — Telehealth: Payer: Self-pay | Admitting: Neurology

## 2016-03-21 NOTE — Telephone Encounter (Signed)
Pt states she is having extreme pain in left knee at night and is coughing up green flem.

## 2016-03-21 NOTE — Telephone Encounter (Signed)
Spoke to patient - her trigeminal neuralgia pain has improved.  She was worried the reported symptoms below may be related to her medication.  Dr. Krista Blue reviewed her medication list and she has been instructed to see her PCP for an evaluation.  She is agreeable to this plan.

## 2016-03-29 ENCOUNTER — Ambulatory Visit (HOSPITAL_COMMUNITY)
Admission: EM | Admit: 2016-03-29 | Discharge: 2016-03-29 | Disposition: A | Discharge status: Home / Self Care | Payer: Federal, State, Local not specified - PPO | Attending: Emergency Medicine | Admitting: Emergency Medicine

## 2016-03-29 ENCOUNTER — Other Ambulatory Visit: Payer: Self-pay | Admitting: *Deleted

## 2016-03-29 ENCOUNTER — Telehealth: Payer: Self-pay | Admitting: Neurology

## 2016-03-29 ENCOUNTER — Encounter (HOSPITAL_COMMUNITY): Payer: Self-pay | Admitting: *Deleted

## 2016-03-29 DIAGNOSIS — S0093XA Contusion of unspecified part of head, initial encounter: Secondary | ICD-10-CM | POA: Diagnosis not present

## 2016-03-29 NOTE — Telephone Encounter (Signed)
Pt called in for an appt due to bruising on right side where her neuralgia is located as well as a fall she took July 18th. She did hit her head but did not black out. Appt has been made for her for July 27th with NP Hassell Done.

## 2016-03-29 NOTE — Discharge Instructions (Signed)
° °  You should call your Neurologist today for further evaluation and treatment of symptoms.   Facial or Scalp Contusion A facial or scalp contusion is a deep bruise on the face or head. Injuries to the face and head generally cause a lot of swelling, especially around the eyes. Contusions are the result of an injury that caused bleeding under the skin. The contusion may turn blue, purple, or yellow. Minor injuries will give you a painless contusion, but more severe contusions may stay painful and swollen for a few weeks.  CAUSES  A facial or scalp contusion is caused by a blunt injury or trauma to the face or head area.  SIGNS AND SYMPTOMS   Swelling of the injured area.   Discoloration of the injured area.   Tenderness, soreness, or pain in the injured area.  DIAGNOSIS  The diagnosis can be made by taking a medical history and doing a physical exam. An X-ray exam, CT scan, or MRI may be needed to determine if there are any associated injuries, such as broken bones (fractures). TREATMENT  Often, the best treatment for a facial or scalp contusion is applying cold compresses to the injured area. Over-the-counter medicines may also be recommended for pain control.  HOME CARE INSTRUCTIONS   Only take over-the-counter or prescription medicines as directed by your health care provider.   Apply ice to the injured area.   Put ice in a plastic bag.   Place a towel between your skin and the bag.   Leave the ice on for 20 minutes, 2-3 times a day.  SEEK MEDICAL CARE IF:  You have bite problems.   You have pain with chewing.   You are concerned about facial defects. SEEK IMMEDIATE MEDICAL CARE IF:  You have severe pain or a headache that is not relieved by medicine.   You have unusual sleepiness, confusion, or personality changes.   You throw up (vomit).   You have a persistent nosebleed.   You have double vision or blurred vision.   You have fluid drainage from  your nose or ear.   You have difficulty walking or using your arms or legs.  MAKE SURE YOU:   Understand these instructions.  Will watch your condition.  Will get help right away if you are not doing well or get worse.   This information is not intended to replace advice given to you by your health care provider. Make sure you discuss any questions you have with your health care provider.   Document Released: 10/04/2004 Document Revised: 09/17/2014 Document Reviewed: 04/09/2013 Elsevier Interactive Patient Education [employer].

## 2016-03-29 NOTE — Telephone Encounter (Signed)
I spoke to pt. I offered her a sooner appt with Hoyle Sauer, NP but she declined and said that she can't come in any sooner. She was seen in the ED for her fall yesterday and she has a bruise but the ED discharged her and told her to call here. She says that she has not had a headache, confusion vision changes, or pain on her head following the fall. I advised her that if she does have any of these symptoms to please call 911. Pt verbalized understanding.

## 2016-03-29 NOTE — ED Notes (Signed)
Fell     Out of  Bed  3  Feet  2  Days   And  inj     l  Hip     l  Knee          Bleeding  From  Behind  r ear  The next  Day      Has   Pain r  Side   Of  Face          History  Of  Trigeminal  r    Head  Tender  To  Touch

## 2016-03-30 NOTE — ED Provider Notes (Signed)
CSN: [id number]     Arrival date & time 03/29/16  81 History   First MD Initiated Contact with Patient 03/29/16 1300     Chief Complaint  Patient presents with  . Fall   (Consider location/radiation/quality/duration/timing/severity/associated sxs/prior Treatment) HPI Comments: Patient presents with evaluation after falling out of bed 2 days ago. She fell from her bed about 3 feet off the ground. She hit the right side of her head near her ear and sliced the lower pinna. Was bleeding but now healing. She also hit her left knee. She is concerned because she has right-sided Trigeminal neuralgia and her pain level is 10/10. She has a Garment/textile technologist Dr. Krista Blue who has been following her for her neurology concerns. She denies any change in vision, dizziness, nausea or vomiting but has short term memory loss. She has taken Tylenol for pain with minimal relief.   Patient is a 65 y.o. female presenting with fall. The history is provided by the patient.  Fall    Past Medical History  Diagnosis Date  . Diverticulosis   . History of shingles 2012--  no residual pain  . History of kidney stones   . Left ureteral calculus   . Trigeminal neuralgia RIGHT  . Arthritis shoulders and back  . 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 Urgency of urination   . History of kidney stones   . 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 Fibromyalgia    Past Surgical History  Procedure Laterality Date  . Wisdom tooth extraction    . Resection colostomy/ closure colostomy with coloproctostomy  02-20-2007  . Exploratory laparotomy/ resection mid to distal sigmoid and proximal rectum/ end proximal sigmoid colostomy  07-17-2006    PERFORATED DIVERTICULITIS WITH PERITONITIS  . Cerebral microvascular decompression  07-05-2006    RIGHT TRIGEMINAL NERVE  . Tonsillectomy  AS CHILD  . Vaginal hysterectomy  1998    Partial  . Ureteroscopy  07/04/2012    Procedure: URETEROSCOPY;  Surgeon: Claybon Jabs, MD;   [facility]: [facility];  Service: Urology;  Laterality: Left;  . Cystoscopy/retrograde/ureteroscopy/stone extraction with basket  07/04/2012    Procedure: CYSTOSCOPY/RETROGRADE/URETEROSCOPY/STONE EXTRACTION WITH BASKET;  Surgeon: Claybon Jabs, MD;  Location: [facility];  Service: Urology;  Laterality: Left;   Family History  Problem Relation Age of Onset  . Heart disease Maternal Grandmother   . Diabetes Mother   . Breast cancer Maternal Aunt   . Lupus Cousin   . Colon cancer Neg Hx   . Esophageal cancer Neg Hx   . Rectal cancer Neg Hx   . Stomach cancer Neg Hx    Social History  Substance Use Topics  . Smoking status: Never Smoker   . Smokeless tobacco: Never Used  . Alcohol Use: Yes     Comment: RARE   OB History    No data available     Review of Systems  Eyes: Negative for visual disturbance.  Neurological: Negative for dizziness, speech difficulty, weakness, light-headedness and numbness.    Allergies  Clindamycin hcl  Home Medications   Prior to Admission medications   Medication Sig Start Date End Date Taking? Authorizing Provider  DULoxetine (CYMBALTA) 30 MG capsule Take 90 mg by mouth daily.  01/26/16   Historical Provider, MD  gabapentin (NEURONTIN) 300 MG capsule Take 2 capsules (600 mg total) by mouth 4 (four) times daily. Patient taking differently: Take 600-900 mg by mouth 3 (three) times daily. Takes 900mg   in the morning, 600mg  at lunch, then 900mg  at night 02/09/16   Marcial Pacas, MD  lamoTRIgine (LAMICTAL) 100 MG tablet Take 1 tablet (100 mg total) by mouth 2 (two) times daily. 03/01/16   Marcial Pacas, MD   Meds Ordered and Administered this Visit  Medications - No data to display  There were no vitals taken for this visit. No data found.   Physical Exam  Constitutional: She appears well-developed and well-nourished. No distress.  HENT:  Head: Normocephalic. Head is without contusion and without laceration.    Right Ear:  Hearing, tympanic membrane and ear canal normal.  Left Ear: Hearing, tympanic membrane, external ear and ear canal normal.  Ears:  Nose: Nose normal.  Mouth/Throat: Oropharynx is clear and moist and mucous membranes are normal.  Small 10mm laceration present mid-central lower right pinna. Edges are well-approximated. Healing well. No redness, bleeding or discharge present. Very tender Entire Right parietal and temporal area is very tender. No distinct ecchymosis present. No bleeding or contusion visible.    Eyes: Conjunctivae and EOM are normal. Pupils are equal, round, and reactive to light.  Neck: Normal range of motion. Neck supple.  Cardiovascular: Normal rate, regular rhythm and normal heart sounds.   Musculoskeletal: Normal range of motion.       Left knee: Normal.  No ecchymosis or signs of injury present on left knee.   Neurological: She is alert. She has normal strength. She is disoriented. No cranial nerve deficit or sensory deficit.  She is disorientated to day and time.   Skin: Skin is warm and dry. Laceration noted.  Laceration to right ear is healing well. No signs of infection.   Psychiatric: She has a normal mood and affect. Thought content normal.    ED Course  Procedures (including critical care time)  Labs Review Labs Reviewed - No data to display  Imaging Review No results found.   Visual Acuity Review  Right Eye Distance:   Left Eye Distance:   Bilateral Distance:    Right Eye Near:   Left Eye Near:    Bilateral Near:         MDM   1. Head contusion, initial encounter   Right ear laceration  Discussed with patient that laceration to ear lobe is healing well and no further treatment needed. Discussed intense tenderness and pain on right side of head. Patient desires a CT or MRI scan. Discussed that we can perform an x-ray of her skull which would show any fracture but could not determine any internal bleeding from x-ray. Reviewed that patient could  go to ER for Imaging and better pain control or she can contact her Neurologist to determine next step in her care. Patient desires to contact Dr Krista Blue (Neurologist) and call her office today for further evaluation. Did not provide any pain relief in Urgent Care since unable to rule out intercranial bleeding which would contradict using an NSAID. May use Tylenol as needed for pain and follow-up with her Neurologist as planned.      Katy Apo, NP 03/30/16 [phone]

## 2016-04-05 ENCOUNTER — Ambulatory Visit: Payer: Federal, State, Local not specified - PPO | Admitting: Nurse Practitioner

## 2016-04-06 ENCOUNTER — Encounter: Payer: Self-pay | Admitting: Nurse Practitioner

## 2016-04-23 ENCOUNTER — Encounter: Payer: Self-pay | Admitting: Internal Medicine

## 2016-04-23 ENCOUNTER — Ambulatory Visit (INDEPENDENT_AMBULATORY_CARE_PROVIDER_SITE_OTHER): Payer: Federal, State, Local not specified - PPO | Admitting: Internal Medicine

## 2016-04-23 VITALS — BP 128/64 | HR 97 | Temp 98.1°F | Resp 12 | Ht 66.0 in | Wt 245.0 lb

## 2016-04-23 DIAGNOSIS — G44311 Acute post-traumatic headache, intractable: Secondary | ICD-10-CM | POA: Diagnosis not present

## 2016-04-23 DIAGNOSIS — R1032 Left lower quadrant pain: Secondary | ICD-10-CM

## 2016-04-23 NOTE — Progress Notes (Signed)
Pre visit review using our clinic review tool, if applicable. No additional management support is needed unless otherwise documented below in the visit note. 

## 2016-04-23 NOTE — Patient Instructions (Signed)
We have ordered a head ct scan to check to make sure nothing has changed.

## 2016-04-23 NOTE — Progress Notes (Signed)
   Subjective:    Patient ID: Summer [name], female    DOB: 07-18-1951, 65 y.o.   MRN: [id number]  HPI The patient is a 65 YO female coming in for follow up of several recent falls. She has fallen out of her bed 2-3 times in the last several week. Her bed is about 4 feet off the ground. She admits to hitting her head several of these times. She sought care in the ED and she feels that they did not evaluate her well. She is supposed to have a procedure on her trigeminal neuralgia in the next couple of weeks. This concerns her that there is some problem which could cause a problem with her procedure. She is having some buckling of her knee joints which is also new since the fall. She has not seen anyone about that. No nausea or vomiting. She has had intractable headache since that time.   Review of Systems  Constitutional: Negative for activity change, appetite change, fatigue, fever and unexpected weight change.  Respiratory: Negative for cough, chest tightness, shortness of breath and wheezing.   Cardiovascular: Negative for chest pain, palpitations and leg swelling.  Gastrointestinal: Negative for abdominal distention, abdominal pain, constipation, diarrhea, nausea and vomiting.  Musculoskeletal: Positive for arthralgias and myalgias. Negative for back pain and gait problem.  Skin: Negative.   Neurological: Positive for numbness. Negative for dizziness, syncope, weakness, light-headedness and headaches.  Psychiatric/Behavioral: Negative.       Objective:   Physical Exam  Constitutional: She is oriented to person, place, and time. She appears well-developed and well-nourished.  Overweight  HENT:  Head: Normocephalic and atraumatic.  Pain in her head which is also in her neck.   Eyes: EOM are normal.  Neck: Normal range of motion.  Cardiovascular: Normal rate and regular rhythm.   Carotids without bruit  Pulmonary/Chest: Effort normal and breath sounds normal. No respiratory distress.  She has no wheezes. She has no rales.  Abdominal: Soft. Bowel sounds are normal. She exhibits no distension. There is no tenderness. There is no rebound and no guarding.  Neurological: She is alert and oriented to person, place, and time. A cranial nerve deficit is present.  Has numbness on her face s/p trigeminal neuralgia surgery.  Skin: Skin is warm and dry.  Psychiatric: She has a normal mood and affect.   Vitals:   04/23/16 1629  BP: 128/64  Pulse: 97  Resp: 12  Temp: 98.1 F (36.7 C)  TempSrc: Oral  SpO2: 97%  Weight: 245 lb (111.1 kg)  Height: 5\' 6"  (1.676 m)      Assessment & Plan:

## 2016-04-24 DIAGNOSIS — R519 Headache, unspecified: Secondary | ICD-10-CM | POA: Insufficient documentation

## 2016-04-24 DIAGNOSIS — R51 Headache: Secondary | ICD-10-CM

## 2016-04-24 DIAGNOSIS — R1032 Left lower quadrant pain: Secondary | ICD-10-CM

## 2016-04-24 HISTORY — DX: Left lower quadrant pain: R10.32

## 2016-04-24 HISTORY — DX: Headache, unspecified: R51.9

## 2016-04-24 NOTE — Assessment & Plan Note (Addendum)
Checking CT head for any changes given her intractable headache since fall. It is unclear if she is having some confusion since her history is somewhat unclear on exam.

## 2016-04-24 NOTE — Assessment & Plan Note (Signed)
Previous CT abdomen and pelvis end of June which she does not remember getting done although when we discuss she does remember. Has history of diverticulitis although not tender on exam. She had been constipated and talked to her about taking something to help her stay regular and see if this helps.

## 2016-05-01 ENCOUNTER — Ambulatory Visit: Payer: Federal, State, Local not specified - PPO | Admitting: Neurology

## 2016-05-10 ENCOUNTER — Ambulatory Visit (INDEPENDENT_AMBULATORY_CARE_PROVIDER_SITE_OTHER): Payer: Federal, State, Local not specified - PPO | Admitting: Neurology

## 2016-05-10 ENCOUNTER — Encounter: Payer: Self-pay | Admitting: Neurology

## 2016-05-10 ENCOUNTER — Ambulatory Visit: Payer: Federal, State, Local not specified - PPO

## 2016-05-10 VITALS — BP 128/87 | HR 80 | Ht 66.0 in | Wt 244.5 lb

## 2016-05-10 DIAGNOSIS — W19XXXD Unspecified fall, subsequent encounter: Secondary | ICD-10-CM

## 2016-05-10 DIAGNOSIS — G3184 Mild cognitive impairment, so stated: Secondary | ICD-10-CM

## 2016-05-10 DIAGNOSIS — G500 Trigeminal neuralgia: Secondary | ICD-10-CM | POA: Diagnosis not present

## 2016-05-10 MED ORDER — LAMOTRIGINE 100 MG PO TABS: ORAL_TABLET | ORAL | 4 refills | Status: DC

## 2016-05-10 NOTE — Progress Notes (Signed)
Chief Complaint  Patient presents with  . Trigeminal Neuralgia    Her pain is starting to return and it is now present on both sides of her face.  She is currently taking Lamical 100mg , BID and gabapentin 300mg , 4 caps in am, 2 caps prn in afternoon, 4 caps at qhs.   . Fall    Reports having multiple falls since last seen in June 2017.  She does not feel dizzy but says her legs just give out.  She feels this started after Lamictal was added to her medication regimen.  . Memory Loss    MMSE 28/30 - 10 animals.  She is concerned about recent difficulty with her memory.     [facility]  PATIENT: [name] DOB: [birth year]   REASON FOR VISIT: Follow-up for right trigeminal neuralgia HISTORY FROM: Patient    HISTORY OF PRESENT ILLNESS: HISTORYMs. Mattoon is a 65 years old right-handed African American female, referred by her primary care physician Dr. Stevie Kern for evaluation of right facial pain  She was diagnosed with right trigeminal neuralgia in 2006, presenting with severe electronics shooting pain at her right upper molar, she had microvascular decompression surgery at [facility] by Dr. Harrison Mons around 2006, which has helped the severe pain, but she continued to have annoying almost constant pain at her right face, she describes hot cold sensitivity over right face, touching of right forehead region will also set of her right facial pain, she felt right tongue swelling, she bites on her right tongue sometimes, she works at customer service, on the phone many hours each day, with prolonged talking, she felt sandpaper was rubbing against her right tongue, also complains of right upper and lower lip numbness, intermittent electronics shooting pain,  Over the years, she has tried different medication, was evaluated by our office Dr. Brett Fairy in 2008, she has tried gabapentin, Lyrica, Cymbalta, Trileptal, currently is taking Tegretol 200 mg twice a day,  initially it helps her some, but failed to help work consistently, She denied visual change, recent few months of left lower back, left hip pain, radiating to left posterior thigh, to her left leg and left foot, difficulty walking after prolonged sitting She complains of left low back pain, left hip pain, radiating pain to left foot, like walking on a dead, burning sensation, she continued to complains of right facial constant achy pain, there was no significant change after started on Oxtellar xR 300 mg every day, no significant side effects,  I have reviewed MRI, mild small vessel disease, no significant abnormality otherwise,  She had MRI lumbar spine (without) demonstrating: L5-S1: facet hypertrophy with moderate right and severe left foraminal stenosis  L4-5: pseudo disc bulging and facet hypertrophy with moderate right and mild left foraminal stenosis. There is 2 mm anterior spondylolisthesis of L4 on L5.  Degenerative spondylosis and disc bulging L1-2 and L4-5.  UPDATE Sep 30 2014: She felt sand paper sensation in her right tongue, flashing sensation, last 1-2 weeks, only take gabapentin 300mg  tid now. She also complains of snoring, frequent wakening during the nighttime, excessive sleepiness at daytime, F ESS score is 37, ESS is 9  UPDATE 10/06/2015 Ms. Busch, 65 year old female returns for follow-up. She has a history of right trigeminal neuralgia is currently on Neurontin 300 mg 3 times a day has breakthrough pain. 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 She occasionally will have upper and lower lip numbness. She returns for reevaluation  UPDATE February 09 2016: She complained increased right facial pain since April 2017, involving right V1/V2 branch, she is taking gabapentin 300 mg 4 times a day, only helped her mildly, triggered by wind blow on her right face, talking, chewing, she works as a  Restaurant manager, fast food, it is very difficult for her to perform her job,  Update March 01 2016: She presented to the emergency room February 21 2016 for nausea, unexpected fall, night before that because severe facial pain, she took extra dose of lamotrigine, made it to 250 mg that particular day, laboratory evaluation showed mildly low potassium of CMP WAS GIVEN SUPPLEMENT, NORMAL CBC, lamotrigine level in June 15 was 15.6,  For a while, she had severe right facial pain, was taking gabapentin 300 mg 2 tablets every 2 hours, 3 tablets at night, about 11 tablets each day 3300 mg every day plus lamotrigine 100 mg twice a day,   Lamotrigine 100 mg twice a day has been very helpful, it has taking care of her long bothered right tongue sandpaper sensation. She is now taking gabapentin 300 mg 3/2/3 tablets each day, she has been off lamotrigine since February 26 2016, the sandpaper sensation on the right side of the tongue came back, she is now taking Cymbalta 30 mg 3 times a day  She has been complains 3 weeks history of urinary urgency, difficulty initiating urine, UA showed mild UTI, was treated with Cipro without improving her symptoms  UPDATE May 10 2016: She was evaluated by Dr. Salomon Fick in July 2017, is planning on to have gamma knife in early September, she complains of mild memory loss, difficulty handling her computer tasks sometimes, there was no family history of memory loss, she also complains of episode, right after she get out of the bed,  She is on polypharmacy, now taking lamotrigine 100 mg twice a day, gabapentin 300 mg 4 in the morning, 2 at lunch time, 4 tablets at nighttime. She also has obesity, snoring, excessive daytime fatigue and sleepiness  We have personally reviewed CT head without contrast in June 2017, no acute abnormality, MRI of the cervical in 2008, multilevel degenerative changes, most severe at C5-6, C6-7, with herniated disc, mild canal stenosis, mild cord signal  changes.  Laboratory evaluation in 2017, elevated WBC 11 point 8, hemoglobin of 13, no significant abnormality on BMP   REVIEW OF SYSTEMS: Full 14 system review of systems performed and notable only for those listed, all others are neg: Chills, drooling, cough, blurred vision, leg swelling, cold, heat intolerance, constipation, difficulty urinating, joint pain, swelling, back pain, muscle cramps, walking difficulty, neck pain, stiffness, itching, bruising easily, memory loss, weakness, tremor, confusion  ALLERGIES: Allergies  Allergen Reactions  . Clindamycin Hcl Itching    itch    HOME MEDICATIONS: Outpatient Medications Prior to Visit  Medication Sig Dispense Refill  . DULoxetine (CYMBALTA) 30 MG capsule Take 90 mg by mouth daily.     Marland Kitchen gabapentin (NEURONTIN) 300 MG capsule Take 2 capsules (600 mg total) by mouth 4 (four) times daily. (Patient taking differently: Take 600-900 mg by mouth 3 (three) times daily. Taking 4 capsules in am, 2 capsules in afternoon prn, 2 capsules in pm.) 240 capsule 11  . lamoTRIgine (LAMICTAL) 100 MG tablet Take 1 tablet (100 mg total) by mouth 2 (two) times daily. 60 tablet 11   No facility-administered medications prior to visit.  PAST MEDICAL HISTORY: Past Medical History:  Diagnosis Date  . Arthritis shoulders and back  . 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 Diverticulosis   . Fibromyalgia   . History of kidney stones   . History of kidney stones   . History of shingles 2012--  no residual pain  . Left ureteral calculus   . Trigeminal neuralgia RIGHT  . Urgency of urination     PAST SURGICAL HISTORY: Past Surgical History:  Procedure Laterality Date  . CEREBRAL MICROVASCULAR DECOMPRESSION  07-05-2006   RIGHT TRIGEMINAL NERVE  . CYSTOSCOPY/RETROGRADE/URETEROSCOPY/STONE EXTRACTION WITH BASKET  07/04/2012   Procedure: CYSTOSCOPY/RETROGRADE/URETEROSCOPY/STONE EXTRACTION WITH BASKET;   Surgeon: Claybon Jabs, MD;  Location: [facility];  Service: Urology;  Laterality: Left;  . EXPLORATORY LAPAROTOMY/ RESECTION MID TO DISTAL SIGMOID AND PROXIMAL RECTUM/ END PROXIMAL SIGMOID COLOSTOMY  07-17-2006   PERFORATED DIVERTICULITIS WITH PERITONITIS  . RESECTION COLOSTOMY/ CLOSURE COLOSTOMY WITH COLOPROCTOSTOMY  02-20-2007  . TONSILLECTOMY  AS CHILD  . URETEROSCOPY  07/04/2012   Procedure: URETEROSCOPY;  Surgeon: Claybon Jabs, MD;  Location: [facility];  Service: Urology;  Laterality: Left;  Marland Kitchen VAGINAL HYSTERECTOMY  1998   Partial  . WISDOM TOOTH EXTRACTION      FAMILY HISTORY: Family History  Problem Relation Age of Onset  . Heart disease Maternal Grandmother   . Diabetes Mother   . Breast cancer Maternal Aunt   . Lupus Cousin   . Colon cancer Neg Hx   . Esophageal cancer Neg Hx   . Rectal cancer Neg Hx   . Stomach cancer Neg Hx     SOCIAL HISTORY: Social History   Social History  . Marital status: Married    Spouse name: N/A  . Number of children: 1  . Years of education: College   Occupational History  .  Unemployed  . HUMAN RESOURCES  [employer]   Social History Main Topics  . Smoking status: Never Smoker  . Smokeless tobacco: Never Used  . Alcohol use Yes     Comment: RARE  . Drug use: No  . Sexual activity: Not on file   Other Topics Concern  . Not on file   Social History Narrative  . No narrative on file     PHYSICAL EXAM  Vitals:   05/10/16 0847  BP: 128/87  Pulse: 80  Weight: 244 lb 8 oz (110.9 kg)  Height: 5\' 6"  (1.676 m)   Body mass index is 39.46 kg/m.   PHYSICAL EXAMNIATION:  Gen: NAD, conversant, well nourised, obese, well groomed                     Cardiovascular: Regular rate rhythm, no peripheral edema, warm, nontender. Eyes: Conjunctivae clear without exudates or hemorrhage Neck: Supple, no carotid bruise. Pulmonary: Clear to auscultation bilaterally   NEUROLOGICAL  EXAM:  MENTAL STATUS:Mild distress Speech:    Speech is normal; fluent and spontaneous with normal comprehension.  Cognition:Mini-Mental Status Examination 28/30, animal naming 10     Orientation to time, place and person    Recent and remote memory: She missed 2/3 recall     Normal Attention span and concentration     Normal Language, naming, repeating,spontaneous speech     Fund of knowledge   CRANIAL NERVES: CN II: Visual fields are full to confrontation. Fundoscopic exam is normal with sharp discs and no vascular changes. Pupils are  round equal and briskly reactive to light. CN III, IV, VI: extraocular movement are normal. No ptosis. CN V: Facial sensation is intact to pinprick in all 3 divisions bilaterally. Corneal responses are intact.  CN VII: Face is symmetric with normal eye closure and smile. CN VIII: Hearing is normal to rubbing fingers CN IX, X: Palate elevates symmetrically. Phonation is normal. CN XI: Head turning and shoulder shrug are intact CN XII: Tongue is midline with normal movements and no atrophy.  MOTOR: There is no pronator drift of out-stretched arms. Muscle bulk and tone are normal. Muscle strength is normal.  REFLEXES: Reflexes are 2+ and symmetric at the biceps, triceps, knees, and ankles. Plantar responses are flexor.  SENSORY: Intact to light touch, pinprick, positional and vibratory sensation are intact in fingers and toes.  COORDINATION: Rapid alternating movements and fine finger movements are intact. There is no dysmetria on finger-to-nose and heel-knee-shin.    GAIT/STANCE: Posture is normal. Gait is steady with normal steps, base, arm swing, and turning. Heel and toe walking are normal. Difficulty with tandem walking Romberg is absent.   DIAGNOSTIC DATA (LABS, IMAGING, TESTING)  -  ASSESSMENT AND PLAN 65 years old [place] American female, with long-standing history of right trigeminal neuralgia, had a history of microvascular  decompression surgery by Dr. Redmond Pulling in 2006 at [facility], at baseline, she had mild persistent symptoms, she has tried multiple different medications in the past, including Tegretol, Trileptal, Neurontin, Lyrica, and Cymbalta, Oxtellar with limited result  Recurrent right trigeminal neuralgia since April 2017 Polypharmacy treatment Falling episodes Mild cognitive impairment   Increased lamotrigine 100 mg in the morning, 200 mg at night,  Her complains of memory loss, falling can be related to her polypharmacy treatment, decrease gabapentin to 300 mg 2 tablets 3 times a day  Gamma Knife is planned in early September at [facility] by Dr. Ola Spurr  Laboratory evaluation to rule out treatable cause of memory loss  History of cervical spondylitic disease, we will repeat MRI of cervical if she continue complains of unsteady gait falling episode  Marcial Pacas, M.D. Ph.D.  [facility] [place], [place] [address] Phone: [phone] Fax:      [phone]

## 2016-05-11 LAB — VITAMIN B12: Vitamin B-12: 422 pg/mL (ref 211–946)

## 2016-05-11 LAB — TSH: TSH: 1.1 u[IU]/mL (ref 0.450–4.500)

## 2016-05-11 LAB — FOLATE: Folate: 9.5 ng/mL (ref >3.0)

## 2016-05-11 LAB — RPR: RPR Ser Ql: NONREACTIVE

## 2016-05-17 HISTORY — PX: OTHER SURGICAL HISTORY: SHX169

## 2016-06-04 ENCOUNTER — Telehealth: Payer: Self-pay | Admitting: Neurology

## 2016-06-04 ENCOUNTER — Other Ambulatory Visit: Payer: Self-pay | Admitting: *Deleted

## 2016-06-04 MED ORDER — LAMOTRIGINE 100 MG PO TABS: ORAL_TABLET | ORAL | 4 refills | Status: DC

## 2016-06-04 NOTE — Telephone Encounter (Signed)
Dosage verified and rx sent to requested pharmacy.

## 2016-06-04 NOTE — Telephone Encounter (Signed)
Patient called to advise of Rx for lamoTRIgine (LAMICTAL) 100 MG tablet didn't have the right amount of pills, please send Rx to [facility].

## 2016-06-07 ENCOUNTER — Ambulatory Visit: Payer: Federal, State, Local not specified - PPO | Admitting: Neurology

## 2016-06-08 ENCOUNTER — Telehealth: Payer: Self-pay | Admitting: Neurology

## 2016-06-08 NOTE — Telephone Encounter (Signed)
Pt called said she is ready to the sleep study.  Please call

## 2016-06-13 ENCOUNTER — Encounter: Payer: Self-pay | Admitting: Neurology

## 2016-06-13 ENCOUNTER — Telehealth: Payer: Self-pay | Admitting: Neurology

## 2016-06-13 DIAGNOSIS — G4730 Sleep apnea, unspecified: Secondary | ICD-10-CM

## 2016-06-13 NOTE — Telephone Encounter (Signed)
Chart reviewed, most recent visit was in May 10 2016, she was seen for trigeminal neuralgia, polypharmacy treatment, mild cognitive impairment, complains of loud snoring, possible obstructive sleep apnea  Please call patient, I have put in order for sleep consultation

## 2016-06-13 NOTE — Telephone Encounter (Signed)
Spoke to patient - she is aware that the order has been placed and to expect a call for scheduling.

## 2016-07-05 ENCOUNTER — Ambulatory Visit (INDEPENDENT_AMBULATORY_CARE_PROVIDER_SITE_OTHER): Payer: Federal, State, Local not specified - PPO | Admitting: Neurology

## 2016-07-05 ENCOUNTER — Encounter: Payer: Self-pay | Admitting: Neurology

## 2016-07-05 VITALS — BP 126/62 | HR 80 | Resp 16 | Ht 66.0 in | Wt 240.0 lb

## 2016-07-05 DIAGNOSIS — E669 Obesity, unspecified: Secondary | ICD-10-CM | POA: Diagnosis not present

## 2016-07-05 DIAGNOSIS — G4719 Other hypersomnia: Secondary | ICD-10-CM

## 2016-07-05 DIAGNOSIS — R0683 Snoring: Secondary | ICD-10-CM | POA: Diagnosis not present

## 2016-07-05 DIAGNOSIS — G4730 Sleep apnea, unspecified: Secondary | ICD-10-CM | POA: Diagnosis not present

## 2016-07-05 DIAGNOSIS — G4761 Periodic limb movement disorder: Secondary | ICD-10-CM | POA: Diagnosis not present

## 2016-07-05 DIAGNOSIS — G2581 Restless legs syndrome: Secondary | ICD-10-CM

## 2016-07-05 DIAGNOSIS — R51 Headache: Secondary | ICD-10-CM

## 2016-07-05 DIAGNOSIS — R519 Headache, unspecified: Secondary | ICD-10-CM

## 2016-07-05 DIAGNOSIS — G4710 Hypersomnia, unspecified: Secondary | ICD-10-CM

## 2016-07-05 NOTE — Progress Notes (Signed)
Subjective:    Patient ID: Summer [name] is a 65 y.o. female.  HPI     Summer Age, MD, PhD [facility] [address] [place], [place] [address]  Dear Aliene Beams,   I saw your patient, Summer [name], upon your kind request in my clinic today for initial consultation of her sleep disorder, in particular, concern for underlying obstructive sleep apnea. The patient is accompanied by Ronalee Belts, her friend, today. As you know, Summer [name] is a 9 year old right-handed woman with an underlying medical history of right trigeminal neuralgia, status post microvascular decompression surgery at [facility] in 2006, status post gamma knife surgery at [facility] and her Dr. Salomon Fick in September 2017, history of diverticulosis, fibromyalgia, kidney stones, shingles, arthritis, glaucoma, and obesity, who reports snoring and excessive daytime somnolence. I reviewed your office note from 05/10/2016. Her Epworth sleepiness score is 11 out of 24 today, her fatigue score is 44 out of 63. She has been told she snores loudly and she has woken up gasping for air, feeling of not breathing. She has one cat, sometimes wakes her up in the early morning.  She goes to bed between 9-10 PM generally. Has nocturia some nights, not nightly. Has reduced her soda intake, occasional tea, no coffee. Wake up time is 7:45 AM and she does not wake up rested. She has occasional morning headaches or rather feels a headache in the middle of the night at times. She lives with her son. She has 1 biological son and 3 stepsons. She is widowed. 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 She does endorse intermittent restless leg symptoms and has been told that she twitches her legs or kicks in her sleep. She has lost some weight in the recent 6 months, around 19 pounds or so. She does not smoke and drinks alcohol very rarely, has reduced her caffeine intake.    Her Past Medical History Is  Significant For: Past Medical History:  Diagnosis Date  . Arthritis shoulders and back  . 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 Diverticulosis   . Fibromyalgia   . History of kidney stones   . History of kidney stones   . History of shingles 2012--  no residual pain  . Left ureteral calculus   . Trigeminal neuralgia RIGHT  . Urgency of urination     Her Past Surgical History Is Significant For: Past Surgical History:  Procedure Laterality Date  . CEREBRAL MICROVASCULAR DECOMPRESSION  07-05-2006   RIGHT TRIGEMINAL NERVE  . CYSTOSCOPY/RETROGRADE/URETEROSCOPY/STONE EXTRACTION WITH BASKET  07/04/2012   Procedure: CYSTOSCOPY/RETROGRADE/URETEROSCOPY/STONE EXTRACTION WITH BASKET;  Surgeon: Claybon Jabs, MD;  Location: [facility];  Service: Urology;  Laterality: Left;  . EXPLORATORY LAPAROTOMY/ RESECTION MID TO DISTAL SIGMOID AND PROXIMAL RECTUM/ END PROXIMAL SIGMOID COLOSTOMY  07-17-2006   PERFORATED DIVERTICULITIS WITH PERITONITIS  . RESECTION COLOSTOMY/ CLOSURE COLOSTOMY WITH COLOPROCTOSTOMY  02-20-2007  . TONSILLECTOMY  AS CHILD  . URETEROSCOPY  07/04/2012   Procedure: URETEROSCOPY;  Surgeon: Claybon Jabs, MD;  Location: [facility];  Service: Urology;  Laterality: Left;  Marland Kitchen VAGINAL HYSTERECTOMY  1998   Partial  . WISDOM TOOTH EXTRACTION      Her Family History Is Significant For: Family History  Problem Relation [name] of Onset  . Heart disease Maternal Grandmother   . Diabetes Mother   . Breast cancer Maternal Aunt   .  Lupus Cousin   . Colon cancer Neg Hx   . Esophageal cancer Neg Hx   . Rectal cancer Neg Hx   . Stomach cancer Neg Hx     Her Social History Is Significant For: Social History   Social History  . Marital status: Married    Spouse name: N/A  . Number of children: 1  . Years of education: College   Occupational History  .  Unemployed  . HUMAN RESOURCES  [employer]    Social History Main Topics  . Smoking status: Never Smoker  . Smokeless tobacco: Never Used  . Alcohol use Yes     Comment: RARE  . Drug use: No  . Sexual activity: Not Asked   Other Topics Concern  . None   Social History Narrative  . None    Her Allergies Are:  Allergies  Allergen Reactions  . Clindamycin Hcl Itching    itch  :   Her Current Medications Are:  Outpatient Encounter Prescriptions as of 07/05/2016  Medication Sig  . gabapentin (NEURONTIN) 300 MG capsule Take 2 capsules (600 mg total) by mouth 4 (four) times daily. (Patient taking differently: Take 600-900 mg by mouth 3 (three) times daily. Taking 4 capsules in am, 2 capsules in afternoon prn, 2 capsules in pm.)  . lamoTRIgine (LAMICTAL) 100 MG tablet One in the morning, 2 tabs at night   No facility-administered encounter medications on file as of 07/05/2016.   :  Review of Systems:  Out of a complete 14 point review of systems, all are reviewed and negative with the exception of these symptoms as listed below:  Review of Systems  Neurological:       Some trouble staying asleep, snoring, patient feels that she stops breathing in her sleep, wakes up feeling tired, daytime fatigue, denies taking naps.    Epworth Sleepiness Scale 0= would never doze 1= slight chance of dozing 2= moderate chance of dozing 3= high chance of dozing  Sitting and reading:2 Watching TV:3 Sitting inactive in a public place (ex. Theater or meeting):1 As a passenger in a car for an hour without a break:2 Lying down to rest in the afternoon:3 Sitting and talking to someone:0 Sitting quietly after lunch (no alcohol):0 In a car, while stopped in traffic:0 Total:11  Objective:  Neurologic Exam  Physical Exam Physical Examination:   Vitals:   07/05/16 1119  BP: 126/62  Pulse: 80  Resp: 16    General Examination: The patient is a very pleasant 65 y.o. female in no acute distress. She appears well-developed and  well-nourished and well groomed.   HEENT: Normocephalic, atraumatic, pupils are equal, round and reactive to light and accommodation. Funduscopic exam is normal with sharp disc margins noted. Extraocular tracking is good without limitation to gaze excursion or nystagmus noted. Normal smooth pursuit is noted. Hearing is grossly intact. Tympanic membranes are clear bilaterally. Face is symmetric with normal facial animation and normal facial sensation. Speech is clear with no dysarthria noted. There is no hypophonia. There is no lip, neck/head, jaw or voice tremor. Neck is supple with full range of passive and active motion. There are no carotid bruits on auscultation. Oropharynx exam reveals: mild mouth dryness, adequate dental hygiene with several teeth missing, and moderate airway crowding secondary to larger tongue and redundant soft palate, tip of uvula was not visualized, tonsils are absent. Mallampati is class III. Tongue protrudes centrally and palate elevates symmetrically. Neck size is 16.75 inches.  Chest: Clear to auscultation without wheezing, rhonchi or crackles noted.  Heart: S1+S2+0, regular and normal without murmurs, rubs or gallops noted.   Abdomen: Soft, non-tender and non-distended with normal bowel sounds appreciated on auscultation.  Extremities: There is 1+ pitting edema in the distal lower extremities bilaterally. Pedal pulses are intact.  Skin: Warm and dry without trophic changes noted. There are no varicose veins.  Musculoskeletal: exam reveals no obvious joint deformities, tenderness or joint swelling or erythema.   Neurologically:  Mental status: The patient is awake, alert and oriented in all 4 spheres. Her immediate and remote memory, attention, language skills and fund of knowledge are appropriate. There is no evidence of aphasia, agnosia, apraxia or anomia. Speech is clear with normal prosody and enunciation. Thought process is linear. Mood is normal and affect is  normal.  Cranial nerves II - XII are as described above under HEENT exam. In addition: shoulder shrug is normal with equal shoulder height noted. Motor exam: Normal bulk, strength and tone is noted. There is no drift, tremor or rebound. Romberg is negative. Reflexes are 2+ throughout. Babinski: Toes are flexor bilaterally. Fine motor skills and coordination: intact with normal finger taps, normal hand movements, normal rapid alternating patting, normal foot taps and normal foot agility.  Cerebellar testing: No dysmetria or intention tremor on finger to nose testing. Heel to shin is unremarkable bilaterally. There is no truncal or gait ataxia.  Sensory exam: intact to light touch in the upper and lower extremities.  Gait, station and balance: She stands easily. No veering to one side is noted. No leaning to one side is noted. Posture is [name]-appropriate and stance is narrow based. Gait shows normal stride length and normal pace. No problems turning are noted. Tandem walk is difficult for her.                Assessment and Plan:  In summary, Indra B Barbarito is a very pleasant 65 y.o.-year old female  with an underlying medical history of right trigeminal neuralgia, status post microvascular decompression surgery at [facility] in 2006, status post gamma knife surgery at [facility] and her Dr. Salomon Fick in September 2017, history of diverticulosis, fibromyalgia, kidney stones, shingles, arthritis, glaucoma, and obesity, whose history and physical exam are in keeping with obstructive sleep apnea (OSA). In addition, she endorses intermittent restless leg symptoms and PLMS. I had a long chat with the patient and Ronalee Belts about my findings and the diagnosis of OSA, its prognosis and treatment options. We talked about medical treatments, surgical interventions and non-pharmacological approaches. I explained in particular the risks and ramifications of untreated moderate to severe OSA, especially with  respect to developing cardiovascular disease down the Road, including congestive heart failure, difficult to treat hypertension, cardiac arrhythmias, or stroke. Even type 2 diabetes has, in part, been linked to untreated OSA. Symptoms of untreated OSA include daytime sleepiness, memory problems, mood irritability and mood disorder such as depression and anxiety, lack of energy, as well as recurrent headaches, especially morning headaches. We talked about trying to maintain a healthy lifestyle in general, as well as the importance of weight control. I encouraged the patient to eat healthy, exercise daily and keep well hydrated, to keep a scheduled bedtime and wake time routine, to not skip any meals and eat healthy snacks in between meals. I advised the patient not to drive when feeling sleepy. I recommended the following at this time: sleep study with potential positive airway pressure titration. (We will  score hypopneas at 3% and split the sleep study into diagnostic and treatment portion, if the estimated. 2 hour AHI is >150/h).   I explained the sleep test procedure to the patient and also outlined possible surgical and non-surgical treatment options of OSA, including the use of a custom-made dental device (which would require a referral to a specialist dentist or oral surgeon), upper airway surgical options, such as pillar implants, radiofrequency surgery, tongue base surgery, and UPPP (which would involve a referral to an ENT surgeon). Rarely, jaw surgery such as mandibular advancement may be considered.  I also explained the CPAP treatment option to the patient, who indicated that she would be willing to try CPAP if the need arises. I explained the importance of being compliant with PAP treatment, not only for insurance purposes but primarily to improve Her symptoms, and for the patient's long term health benefit, including to reduce Her cardiovascular risks. I answered all their questions today and the  patient and her friend were in agreement. I would like to see her back after the sleep study is completed and encouraged her to call with any interim questions, concerns, problems or updates.   Thank you very much for allowing me to participate in the care of this nice patient. If I can be of any further assistance to you please do not hesitate to talk to me.   Sincerely,   Summer Age, MD, PhD

## 2016-07-05 NOTE — Patient Instructions (Signed)

## 2016-07-13 ENCOUNTER — Encounter (HOSPITAL_COMMUNITY): Payer: Self-pay

## 2016-07-13 ENCOUNTER — Emergency Department (HOSPITAL_COMMUNITY)
Admission: EM | Admit: 2016-07-13 | Discharge: 2016-07-13 | Disposition: A | Discharge status: Home / Self Care | Payer: Federal, State, Local not specified - PPO | Attending: Emergency Medicine | Admitting: Emergency Medicine

## 2016-07-13 DIAGNOSIS — R112 Nausea with vomiting, unspecified: Secondary | ICD-10-CM | POA: Insufficient documentation

## 2016-07-13 DIAGNOSIS — R197 Diarrhea, unspecified: Secondary | ICD-10-CM | POA: Diagnosis not present

## 2016-07-13 DIAGNOSIS — T43295A Adverse effect of other antidepressants, initial encounter: Secondary | ICD-10-CM | POA: Insufficient documentation

## 2016-07-13 DIAGNOSIS — T50905A Adverse effect of unspecified drugs, medicaments and biological substances, initial encounter: Secondary | ICD-10-CM

## 2016-07-13 LAB — COMPREHENSIVE METABOLIC PANEL
ALT: 13 U/L — ABNORMAL LOW (ref 14–54)
AST: 21 U/L (ref 15–41)
Albumin: 3.6 g/dL (ref 3.5–5.0)
Alkaline Phosphatase: 97 U/L (ref 38–126)
Anion gap: 10 (ref 5–15)
BUN: <5 mg/dL — ABNORMAL LOW (ref 6–20)
CO2: 28 mmol/L (ref 22–32)
Calcium: 9.2 mg/dL (ref 8.9–10.3)
Chloride: 104 mmol/L (ref 101–111)
Creatinine, Ser: 0.82 mg/dL (ref 0.44–1.00)
GFR calc Af Amer: >60 mL/min (ref >60)
GFR calc non Af Amer: >60 mL/min (ref >60)
Glucose, Bld: 137 mg/dL — ABNORMAL HIGH (ref 65–99)
Potassium: 3.6 mmol/L (ref 3.5–5.1)
Sodium: 142 mmol/L (ref 135–145)
Total Bilirubin: 0.5 mg/dL (ref 0.3–1.2)
Total Protein: 7 g/dL (ref 6.5–8.1)

## 2016-07-13 LAB — CBC
HCT: 37.8 % (ref 36.0–46.0)
Hemoglobin: 11.9 g/dL — ABNORMAL LOW (ref 12.0–15.0)
MCH: 26.3 pg (ref 26.0–34.0)
MCHC: 31.5 g/dL (ref 30.0–36.0)
MCV: 83.4 fL (ref 78.0–100.0)
Platelets: 426 10*3/uL — ABNORMAL HIGH (ref 150–400)
RBC: 4.53 MIL/uL (ref 3.87–5.11)
RDW: 14.9 % (ref 11.5–15.5)
WBC: 7.2 10*3/uL (ref 4.0–10.5)

## 2016-07-13 LAB — LIPASE, BLOOD: Lipase: 20 U/L (ref 11–51)

## 2016-07-13 MED ORDER — SODIUM CHLORIDE 0.9 % IV BOLUS (SEPSIS): 1000.0000 mL | Freq: Once | INTRAVENOUS | Status: DC

## 2016-07-13 MED ORDER — ONDANSETRON 4 MG PO TBDP
ORAL_TABLET | ORAL | Status: AC
  Filled 2016-07-13: qty 1

## 2016-07-13 MED ORDER — ONDANSETRON 4 MG PO TBDP: 4.0000 mg | ORAL_TABLET | Freq: Three times a day (TID) | ORAL | 0 refills | Status: DC | PRN

## 2016-07-13 MED ORDER — ONDANSETRON 4 MG PO TBDP
4.0000 mg | ORAL_TABLET | Freq: Once | ORAL | Status: AC | PRN
  Administered 2016-07-13: 4 mg via ORAL

## 2016-07-13 NOTE — ED Notes (Signed)
Pt wheeled to waiting room. Verbalizes understanding of discharge instructions. NAD.

## 2016-07-13 NOTE — ED Triage Notes (Signed)
Pt presents for evaluation of N/V/D starting today after taking 3 duloxetine pills. Pt. States she was told not to take duloxetine with her gabapentin and lamictal but was having pain so she did. Pt. States she has had one episode of vomiting and one episode of diarrhea.

## 2016-07-13 NOTE — ED Notes (Signed)
This RN went to place IV access, patient refused IV and states "I feel better I want to go home." MD Darl Householder made aware.

## 2016-07-13 NOTE — ED Provider Notes (Signed)
[facility] DEPT Provider Note   CSN: [id number] Arrival date & time: 07/13/16  1134     History   Chief Complaint Chief Complaint  Patient presents with  . Emesis  . Nausea    HPI Summer [name] is a 65 y.o. female.  HPI 65 year old femalewith a history of fibromyalgia, trigeminal neuralgia presents to the emergency department noting the onset of nausea and nonbloody vomiting 1 with one episode of watery diarrhea today after she took her normally prescribed gabapentin, Lamictal and also took a dose of Cymbalta. The patient states that she has not been taking Cymbalta for many months and states that previously she was advised not to take them all together due to potential medication side effects and interactions. Despite this the patient states that she took a dose of Cymbalta because her chronic generalized pain was worse than normal this morning. Approximately 30-45 minutes after taking these medications the patient experienced her GI symptoms. She denied any fever, chills, chest pain, chest tightness, palpitations, abdominal pain, dysuria, urinary urgency, urinary frequency. As time has gone on after the onset of these GI symptoms to have gradually resolved and currently the patient just states that she feels a bit drowsy. She denies any known sick contacts, suspicious food intake, recent travel.  Past Medical History:  Diagnosis Date  . Arthritis shoulders and back  . 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 Diverticulosis   . Fibromyalgia   . History of kidney stones   . History of kidney stones   . History of shingles 2012--  no residual pain  . Left ureteral calculus   . Trigeminal neuralgia RIGHT  . Urgency of urination     Patient Active Problem List   Diagnosis Date Noted  . Headache 04/24/2016  . LLQ pain 04/24/2016  . Glossitis 02/02/2016  . Routine general medical examination at a health care facility  10/27/2015  . Facial pain 10/06/2015  . Trigeminal neuralgia of right side of face 11/30/2010  . Morbid obesity ([place]) 11/09/2010  . Fibromyalgia 11/09/2010  . HEMATURIA, HX OF 11/14/2009  . LOC OSTEOARTHROS NOT SPEC PRIM/SEC OTH [facility] SITE 06/27/2009  . Allergic rhinitis 08/26/2008  . 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 CEREBRAL MICROVASCULAR DECOMPRESSION  07-05-2006   RIGHT TRIGEMINAL NERVE  . CYSTOSCOPY/RETROGRADE/URETEROSCOPY/STONE EXTRACTION WITH BASKET  07/04/2012   Procedure: CYSTOSCOPY/RETROGRADE/URETEROSCOPY/STONE EXTRACTION WITH BASKET;  Surgeon: Claybon Jabs, MD;  Location[facility];  Service: Urology;  Laterality: Left;  . EXPLORATORY LAPAROTOMY/ RESECTION MID TO DISTAL SIGMOID AND PROXIMAL RECTUM/ END PROXIMAL SIGMOID COLOSTOMY  07-17-2006   PERFORATED DIVERTICULITIS WITH PERITONITIS  . RESECTION COLOSTOMY/ CLOSURE COLOSTOMY WITH COLOPROCTOSTOMY  02-20-2007  . TONSILLECTOMY  AS CHILD  . URETEROSCOPY  07/04/2012   Procedure: URETEROSCOPY;  Surgeon: Claybon Jabs, MD;  Location[facility];  Service: Urology;  Laterality: Left;  Marland Kitchen VAGINAL HYSTERECTOMY  1998   Partial  . WISDOM TOOTH EXTRACTION      OB History    No data available       Home Medications    Prior to Admission medications   Medication Sig Start Date End Date Taking? Authorizing Provider  gabapentin (NEURONTIN) 300 MG capsule Take 2 capsules (600 mg total) by mouth 4 (four) times daily. Patient taking differently: Take 600-900 mg by mouth 3 (three) times daily. Taking 4 capsules  in am, 2 capsules in afternoon prn, 2 capsules in pm. 02/09/16   Marcial Pacas, MD  lamoTRIgine (LAMICTAL) 100 MG tablet One in the morning, 2 tabs at night 06/04/16   Marcial Pacas, MD  ondansetron (ZOFRAN ODT) 4 MG disintegrating tablet Take 1 tablet (4 mg total) by mouth every 8 (eight) hours as needed for nausea or vomiting.  07/13/16   Zenovia Jarred, DO    Family History Family History  Problem Relation Age of Onset  . Heart disease Maternal Grandmother   . Diabetes Mother   . Breast cancer Maternal Aunt   . Lupus Cousin   . Colon cancer Neg Hx   . Esophageal cancer Neg Hx   . Rectal cancer Neg Hx   . Stomach cancer Neg Hx     Social History Social History  Substance Use Topics  . Smoking status: Never Smoker  . Smokeless tobacco: Never Used  . Alcohol use Yes     Comment: RARE     Allergies   Clindamycin hcl   Review of Systems Review of Systems  Constitutional: Positive for appetite change. Negative for chills and fever.  Respiratory: Negative for cough, chest tightness and shortness of breath.   Cardiovascular: Negative for chest pain and palpitations.  Gastrointestinal: Positive for diarrhea, nausea and vomiting. Negative for abdominal pain and blood in stool.  Genitourinary: Negative for dysuria, flank pain, hematuria and urgency.  Musculoskeletal: Positive for myalgias (generalized, chronic).  Skin: Negative for wound.  Neurological: Positive for light-headedness (drowsiness). Negative for dizziness, syncope, speech difficulty, weakness, numbness and headaches.  All other systems reviewed and are negative.    Physical Exam Updated Vital Signs BP 128/70   Pulse 86   Temp 98.1 F (36.7 C) (Oral)   Resp 18   SpO2 99%   Physical Exam  Constitutional: She is oriented to person, place, and time. She appears well-developed and well-nourished. No distress.  Flat affect  HENT:  Head: Normocephalic and atraumatic.  Nose: Nose normal.  Mouth/Throat: Oropharynx is clear and moist.  Eyes: Conjunctivae and EOM are normal. Pupils are equal, round, and reactive to light.  Neck: Normal range of motion. Neck supple.  Cardiovascular: Normal rate, regular rhythm, normal heart sounds and intact distal pulses.   Pulmonary/Chest: Effort normal and breath sounds normal.  Abdominal: Soft. She  exhibits no distension. There is no tenderness.  Musculoskeletal: She exhibits no edema or tenderness.  Neurological: She is alert and oriented to person, place, and time. No cranial nerve deficit. Coordination normal.  Skin: Skin is warm and dry. No rash noted. She is not diaphoretic.  Nursing note and vitals reviewed.    ED Treatments / Results  Labs (all labs ordered are listed, but only abnormal results are displayed) Labs Reviewed  COMPREHENSIVE METABOLIC PANEL - Abnormal; Notable for the following:       Result Value   Glucose, Bld 137 (*)    BUN <5 (*)    ALT 13 (*)    All other components within normal limits  CBC - Abnormal; Notable for the following:    Hemoglobin 11.9 (*)    Platelets 426 (*)    All other components within normal limits  LIPASE, BLOOD    EKG  EKG Interpretation None       Radiology No results found.  Procedures Procedures (including critical care time)  Medications Ordered in ED Medications  ondansetron (ZOFRAN-ODT) disintegrating tablet 4 mg (4 mg Oral Given 07/13/16 1150)  Initial Impression / Assessment and Plan / ED Course  I have reviewed the triage vital signs and the nursing notes.  Pertinent labs & imaging results that were available during my care of the patient were reviewed by me and considered in my medical decision making (see chart for details).  Clinical Course   65 year old female with history of fibromyalgia presents to the emergency department after one episode of nausea, vomiting, diarrhea, drowsiness after taking her gabapentin, limits to, as well as a dose of Cymbalta. Symptoms now resolved.  Exam as above, with benign abdomen, and no concerning findings.  Labs were drawn and returned reassuringly, showing no significant abnormalities.  Feel this was likely a medication reaction, though could possibly be early viral gastroenteritis. Low suspicion for cholecystitis, appendicitis, diverticulitis, volvulus, or  any other acute intra-abdominal process. She was given Zofran while in triage and had significant improvement in her symptoms. She was prescribed the same for further symptomatic relief and was advised to no longer take Cymbalta. She was recommended to follow up closely with her primary care physician to further discuss her medication regimen. Return precautions were given for worsening or concerns. This plan was discussed with the patient and her husband at the bedside and they stated both understanding and agreement.  Final Clinical Impressions(s) / ED Diagnoses   Final diagnoses:  Nausea vomiting and diarrhea  Adverse effect of drug, initial encounter    New Prescriptions Discharge Medication List as of 07/13/2016  1:37 PM    START taking these medications   Details  ondansetron (ZOFRAN ODT) 4 MG disintegrating tablet Take 1 tablet (4 mg total) by mouth every 8 (eight) hours as needed for nausea or vomiting., Starting Fri 07/13/2016, [facility], DO 07/14/16 [id number]    Drenda Freeze, MD 07/16/16 1026

## 2016-07-13 NOTE — ED Notes (Signed)
Wheeled pt back to room. Pt placed in gown and on monitor. 

## 2016-08-16 ENCOUNTER — Ambulatory Visit: Payer: Federal, State, Local not specified - PPO | Admitting: Neurology

## 2016-08-31 ENCOUNTER — Ambulatory Visit (INDEPENDENT_AMBULATORY_CARE_PROVIDER_SITE_OTHER): Payer: Federal, State, Local not specified - PPO | Admitting: Family

## 2016-08-31 ENCOUNTER — Encounter: Payer: Self-pay | Admitting: Family

## 2016-08-31 ENCOUNTER — Ambulatory Visit (INDEPENDENT_AMBULATORY_CARE_PROVIDER_SITE_OTHER)
Admission: RE | Admit: 2016-08-31 | Discharge: 2016-08-31 | Disposition: A | Discharge status: Home / Self Care | Payer: Federal, State, Local not specified - PPO | Source: Ambulatory Visit | Attending: Family | Admitting: Family

## 2016-08-31 DIAGNOSIS — S93602A Unspecified sprain of left foot, initial encounter: Secondary | ICD-10-CM | POA: Diagnosis not present

## 2016-08-31 DIAGNOSIS — R52 Pain, unspecified: Secondary | ICD-10-CM | POA: Diagnosis not present

## 2016-08-31 HISTORY — DX: Pain, unspecified: R52

## 2016-08-31 HISTORY — DX: Unspecified sprain of left foot, initial encounter: S93.602A

## 2016-08-31 MED ORDER — IBUPROFEN-FAMOTIDINE 800-26.6 MG PO TABS: 1.0000 | ORAL_TABLET | Freq: Three times a day (TID) | ORAL | 0 refills | Status: DC | PRN

## 2016-08-31 NOTE — Assessment & Plan Note (Addendum)
Symptoms and exam consistent with possible forefoot strain/sprain. No significant trauma. Treat conservatively with ice, compression, and elevation. Sample of Duexis provided. Obtain x-rays to rule out bony abnormality. Encouraged to wear a good supportive shoe. Follow-up if symptoms worsen or do not improve.

## 2016-08-31 NOTE — Patient Instructions (Addendum)
Thank you for choosing [facility].  SUMMARY AND INSTRUCTIONS:  Please continue to take your medications as prescribed.  Ice x 20 minutes every 2 hours as needed and after activity.  Wear a good supportive shoe.   Duexis for the next 3-5 days and then ibuprofen as needed.  Continue to monitor sign pain.  Recommend a good bowel regimen.   Medication:  Your prescription(s) have been submitted to your pharmacy or been printed and provided for you. Please take as directed and contact our office if you believe you are having problem(s) with the medication(s) or have any questions.  Imaging / Radiology:  Please stop by radiology on the basement level of the building for your x-rays. Your results will be released to [facility] (or called to you) after review, usually within 72 hours after test completion. If any treatments or changes are necessary, you will be notified at that same time.  Follow up:  If your symptoms worsen or fail to improve, please contact our office for further instruction, or in case of emergency go directly to the emergency room at the closest medical facility.

## 2016-08-31 NOTE — Progress Notes (Signed)
Subjective:    Patient ID: Summer [name], female    DOB: [birth year], 65 y.o.   MRN: [id number]  Chief Complaint  Patient presents with  . Foot Pain    left foot pain that has gotten worse over that last week and right side pain     HPI:  Summer [name] is a 65 y.o. 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 and presents today for an office visit.  1.) Foot pain - This is a new problem. Associated symptom of pain located in her left foot has been going on for about 4-5 days at a severe level and is a piece of "whole body pain." Pains are described as sharp on occasion. Denies any trauma or injury that she can recall. Modifying factors include ibuprofen which does not help very much. Severity is enough to effect her gait.  2.) Side pain - Associated symptom of pain located on her right side that have been going on for a significant time and describes as both sharp and achy. Pain generally waxes and wanes and is worse during the day. Denies abdominal pain, constipation, nausea, or diarrhea. Not moving around makes her side pain worse. Denies any trauma. Denies urinary symptoms.    Allergies  Allergen Reactions  . Clindamycin Hcl Itching    itch      Outpatient Medications Prior to Visit  Medication Sig Dispense Refill  . gabapentin (NEURONTIN) 300 MG capsule Take 2 capsules (600 mg total) by mouth 4 (four) times daily. (Patient taking differently: Take 600-900 mg by mouth 3 (three) times daily. Taking 4 capsules in am, 2 capsules in afternoon prn, 2 capsules in pm.) 240 capsule 11  . lamoTRIgine (LAMICTAL) 100 MG tablet One in the morning, 2 tabs at night  90 tablet 4  . ondansetron (ZOFRAN ODT) 4 MG disintegrating tablet Take 1 tablet (4 mg total) by mouth every 8 (eight) hours as needed for nausea or vomiting. 10 tablet 0   No facility-administered medications prior to visit.       Past Surgical History:  Procedure Laterality Date  . CEREBRAL MICROVASCULAR DECOMPRESSION  07-05-2006   RIGHT TRIGEMINAL NERVE  . CYSTOSCOPY/RETROGRADE/URETEROSCOPY/STONE EXTRACTION WITH BASKET  07/04/2012   Procedure: CYSTOSCOPY/RETROGRADE/URETEROSCOPY/STONE EXTRACTION WITH BASKET;  Surgeon: Claybon Jabs, MD;  Location: [facility];  Service: Urology;  Laterality: Left;  . EXPLORATORY LAPAROTOMY/ RESECTION MID TO DISTAL SIGMOID AND PROXIMAL RECTUM/ END PROXIMAL SIGMOID COLOSTOMY  07-17-2006   PERFORATED DIVERTICULITIS WITH PERITONITIS  . RESECTION COLOSTOMY/ CLOSURE COLOSTOMY WITH COLOPROCTOSTOMY  02-20-2007  . TONSILLECTOMY  AS CHILD  . URETEROSCOPY  07/04/2012   Procedure: URETEROSCOPY;  Surgeon: Claybon Jabs, MD;  Location: [facility];  Service: Urology;  Laterality: Left;  Marland Kitchen VAGINAL HYSTERECTOMY  1998   Partial  . WISDOM TOOTH EXTRACTION        Past Medical History:  Diagnosis Date  . Arthritis shoulders and back  . 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 Diverticulosis   . Fibromyalgia   . History of kidney stones   . History of kidney stones   . History of shingles 2012--  no  residual pain  . Left ureteral calculus   . Trigeminal neuralgia RIGHT  . Urgency of urination       Review of Systems  Constitutional: Negative for chills and fever.  Musculoskeletal:       Positive for left foot pain and right side pain.  Neurological: Negative for weakness and numbness.      Objective:    BP 138/76 (BP Location: Left Arm, Patient Position: Sitting, Cuff Size: Large)   Pulse 92   Temp 98.6 F (37 C) (Oral)   Resp 16   Ht 5\' 6"  (1.676 m)   Wt 243 lb  12.8 oz (110.6 kg)   SpO2 97%   BMI 39.35 kg/m  Nursing note and vital signs reviewed.  Physical Exam  Constitutional: She is oriented to person, place, and time. She appears well-developed and well-nourished. No distress.  Cardiovascular: Normal rate, regular rhythm, normal heart sounds and intact distal pulses.   Pulmonary/Chest: Effort normal and breath sounds normal.  Abdominal: Soft. Bowel sounds are normal. She exhibits no distension and no mass. There is no tenderness. There is no rebound and no guarding.  Right side/abdomen - no obvious deformity, discoloration, or edema. No palpable tenderness, crepitus, or deformity noted. Abdominal exam is benign. Unable to re-create symptoms.  Musculoskeletal:  Left foot - no obvious deformity or discoloration with mild/moderate edema noted on the lateral aspect foot. Tenderness with no crepitus or deformity noted around the cuboid and metatarsals 4 and 5. Range of motion within normal limits. Pulses and sensation are intact and appropriate. Negative metatarsal glide  Neurological: She is alert and oriented to person, place, and time.  Skin: Skin is warm and dry.  Psychiatric: She has a normal mood and affect. Her behavior is normal. Judgment and thought content normal.       Assessment & Plan:   Problem List Items Addressed This Visit      Musculoskeletal and Integument   Foot sprain, left, initial encounter    Symptoms and exam consistent with possible forefoot strain/sprain. No significant trauma. Treat conservatively with ice, compression, and elevation. Sample of Duexis provided. Obtain x-rays to rule out bony abnormality. Encouraged to wear a good supportive shoe. Follow-up if symptoms worsen or do not improve.      Relevant Medications   Ibuprofen-Famotidine 800-26.6 MG TABS   Other Relevant Orders   DG Foot Complete Left (Completed)     Other   Pain of right side of body    Continues to experience nonspecific pain located on  the right side of her body with no significant findings on exam. Question possible relation to fibromyalgia.  Previous CT scans were negative. There was a very small kidney stone, however is most likely non-contributing to the current pain. Pain patterns are irregular with no triggers. Does not appear to be muscle skeletal in nature. Continue to monitor and follow-up if pain worsens or does not improve.         I have discontinued Ms. Winokur's ondansetron. I am also having her start on Ibuprofen-Famotidine. Additionally, I am having her maintain her gabapentin and lamoTRIgine.   Follow-up: Return if symptoms worsen or fail to improve.  Mauricio Po, FNP

## 2016-08-31 NOTE — Assessment & Plan Note (Signed)
Continues to experience nonspecific pain located on the right side of her body with no significant findings on exam. Question possible relation to fibromyalgia.  Previous CT scans were negative. There was a very small kidney stone, however is most likely non-contributing to the current pain. Pain patterns are irregular with no triggers. Does not appear to be muscle skeletal in nature. Continue to monitor and follow-up if pain worsens or does not improve.

## 2016-09-03 ENCOUNTER — Encounter: Payer: Self-pay | Admitting: Family

## 2016-09-05 ENCOUNTER — Other Ambulatory Visit: Payer: Self-pay | Admitting: *Deleted

## 2016-09-05 MED ORDER — LAMOTRIGINE 100 MG PO TABS: ORAL_TABLET | ORAL | 0 refills | Status: DC

## 2016-10-16 ENCOUNTER — Telehealth: Payer: Self-pay | Admitting: Nurse Practitioner

## 2016-10-16 NOTE — Telephone Encounter (Signed)
LMVM for pt to return call.   

## 2016-10-16 NOTE — Telephone Encounter (Signed)
I spoke to pt, she has appt with CM/NP 12-07-16 Friday to discuss management of her meds.  She is taking lamictal 100mg  one po bid, (due to unsteadiness she cannot take 3 tabs).  She is taking gabapentin 300mg  4 caps am, 3 caps noon, and 2 caps pm.  She states that she feels like the surgery she had has not helped her .  She did not make her appt with surgeon (that she had in February) and has not f/u scheduled since needs Friday appt.  Her issue is that she would like to change her medication to another due to the SE (unsteadiness, also c/o of some memory issues).  I told her that will forward to Dr. Krista Blue for [facility].  If earlier time is available with CM/NP can call her.  (appt requested is for medication management, not exacerbation).

## 2016-10-16 NOTE — Telephone Encounter (Signed)
Pt called to schedule appt with Dr Krista Blue. She is booked into April, pt requested to be seen sooner. She is having increased pain with trigeminal neuralgia

## 2016-10-17 ENCOUNTER — Telehealth: Payer: Self-pay | Admitting: Neurology

## 2016-10-17 NOTE — Telephone Encounter (Signed)
Chart reviewed, patient with long-standing right trigeminal neuralgia, previous vascular decompression, most recent had gamma  knife procedure at [facility]  She has recurrent right facial discomfort, on polypharmacy treatment.  Selinda Eon, please bring her in at my next available for medication management

## 2016-10-17 NOTE — Telephone Encounter (Signed)
Attempted to reach patient at home (memory full) and mobile (voicemail full).  Unable to leave message at either number.  I will try to reach her again.

## 2016-10-18 NOTE — Telephone Encounter (Signed)
Left another message on home phone - voicemail full on mobile.

## 2016-10-18 NOTE — Telephone Encounter (Signed)
Attempted to reach patient this morning - mailbox full on mobile - able to leave message on home phone.

## 2016-10-18 NOTE — Telephone Encounter (Signed)
Spoke to patient - she is able to come in for 7:30am appts.  Her appt has been moved to 10/25/16.

## 2016-10-25 ENCOUNTER — Ambulatory Visit: Payer: Self-pay | Admitting: Neurology

## 2016-10-25 ENCOUNTER — Encounter: Payer: Self-pay | Admitting: Neurology

## 2016-10-25 ENCOUNTER — Encounter: Payer: Self-pay | Admitting: Nurse Practitioner

## 2016-10-26 ENCOUNTER — Ambulatory Visit (INDEPENDENT_AMBULATORY_CARE_PROVIDER_SITE_OTHER): Payer: Federal, State, Local not specified - PPO | Admitting: Internal Medicine

## 2016-10-26 ENCOUNTER — Other Ambulatory Visit (INDEPENDENT_AMBULATORY_CARE_PROVIDER_SITE_OTHER): Payer: Federal, State, Local not specified - PPO

## 2016-10-26 ENCOUNTER — Telehealth: Payer: Self-pay | Admitting: Neurology

## 2016-10-26 ENCOUNTER — Telehealth: Payer: Self-pay | Admitting: *Deleted

## 2016-10-26 ENCOUNTER — Encounter: Payer: Self-pay | Admitting: Internal Medicine

## 2016-10-26 VITALS — BP 158/84 | HR 95 | Temp 99.4°F | Ht 66.0 in | Wt 242.0 lb

## 2016-10-26 DIAGNOSIS — L299 Pruritus, unspecified: Secondary | ICD-10-CM | POA: Insufficient documentation

## 2016-10-26 DIAGNOSIS — M7061 Trochanteric bursitis, right hip: Secondary | ICD-10-CM | POA: Diagnosis not present

## 2016-10-26 DIAGNOSIS — R21 Rash and other nonspecific skin eruption: Secondary | ICD-10-CM

## 2016-10-26 HISTORY — DX: Pruritus, unspecified: L29.9

## 2016-10-26 HISTORY — DX: Trochanteric bursitis, right hip: M70.61

## 2016-10-26 LAB — CBC
HCT: 37.5 % (ref 36.0–46.0)
Hemoglobin: 12.3 g/dL (ref 12.0–15.0)
MCHC: 32.9 g/dL (ref 30.0–36.0)
MCV: 85 fl (ref 78.0–100.0)
Platelets: 376 10*3/uL (ref 150.0–400.0)
RBC: 4.41 Mil/uL (ref 3.87–5.11)
RDW: 16.9 % — ABNORMAL HIGH (ref 11.5–15.5)
WBC: 7.6 10*3/uL (ref 4.0–10.5)

## 2016-10-26 LAB — COMPREHENSIVE METABOLIC PANEL
ALT: 10 U/L (ref 0–35)
AST: 15 U/L (ref 0–37)
Albumin: 4.1 g/dL (ref 3.5–5.2)
Alkaline Phosphatase: 83 U/L (ref 39–117)
BUN: 11 mg/dL (ref 6–23)
CO2: 30 mEq/L (ref 19–32)
Calcium: 9.3 mg/dL (ref 8.4–10.5)
Chloride: 102 mEq/L (ref 96–112)
Creatinine, Ser: 0.94 mg/dL (ref 0.40–1.20)
GFR: 77.21 mL/min (ref >60.00)
Glucose, Bld: 95 mg/dL (ref 70–99)
Potassium: 3.6 mEq/L (ref 3.5–5.1)
Sodium: 140 mEq/L (ref 135–145)
Total Bilirubin: 0.4 mg/dL (ref 0.2–1.2)
Total Protein: 7.2 g/dL (ref 6.0–8.3)

## 2016-10-26 LAB — TSH: TSH: 1.27 u[IU]/mL (ref 0.35–4.50)

## 2016-10-26 MED ORDER — PREDNISONE 20 MG PO TABS: 40.0000 mg | ORAL_TABLET | Freq: Every day | ORAL | 0 refills | Status: DC

## 2016-10-26 NOTE — Assessment & Plan Note (Signed)
Is not steven's johnson. Checking CBC, CMP, ANA, TSH, lamictal level. Rx for prednisone for her trochanteric bursitis will also likely help with this. She is advised to use benadryl when it starts itching and hurting in the evening. She will still keep her follow up with neurology.

## 2016-10-26 NOTE — Telephone Encounter (Signed)
Patient calling in reference to email sent yesterday.  Please call

## 2016-10-26 NOTE — Telephone Encounter (Signed)
LMOM home#.  Cell# VM is full/fim

## 2016-10-26 NOTE — Telephone Encounter (Signed)
See other note

## 2016-10-26 NOTE — Telephone Encounter (Signed)
Faith:  Please check on patient, if her physician think her rash is due to lamotrigine, she should stop taking it.

## 2016-10-26 NOTE — Telephone Encounter (Signed)
LMOM home# for pt. that per YY, if her pcp feels rash is due to Lamictal, she should stop it.  I tried calling her cell# as well, but vm is full/fim

## 2016-10-26 NOTE — Telephone Encounter (Signed)
Appointment Request From: Summer [name]    With Provider: Dennie Bible, NP [[facility]]    Preferred Date Range: From 10/26/2016 To 10/26/2016    Preferred Times: Any    Reason for visit: Office Visit    Comments:  Would like to see you tomorrow if possible very concerned over red rash , very itchy and scaling on my face as well as pain in my left hand which gets extremely cold and pain running up to my elbow is sharp and painful. Please contact me on my home phone ([phone]) or cell [phone]). If no answer on home phone please leave a message I will be checking ever so often to see if message left. I will be in doctor [place] office ([facility]'s Office tomorrow at 10:00) you may be able to reach me through her office phone # [phone]. HELP! Signed,    Very Worried!

## 2016-10-26 NOTE — Patient Instructions (Signed)
We have sent in prednisone to take for the hip pain and it should also help with the rash as well.   We are checking the labs today and will call you back with the results.

## 2016-10-26 NOTE — Telephone Encounter (Signed)
Pt has seen pcp, note in EPIC. Made Dr. Krista Blue aware.

## 2016-10-26 NOTE — Progress Notes (Signed)
Pre visit review using our clinic review tool, if applicable. No additional management support is needed unless otherwise documented below in the visit note. 

## 2016-10-26 NOTE — Assessment & Plan Note (Signed)
Rx for prednisone to help and given stretching exercises to help with recovery.

## 2016-10-26 NOTE — Progress Notes (Signed)
   Subjective:    Patient ID: Summer [name], female    DOB: [birth year], 66 y.o.   MRN: [id number]  HPI The patient is a 66 YO female coming in for new rash going on for 1-2 weeks. She is concerned because she is taking the lamictal and gabapentin and looked up that you can get a serious rash from it. She is having the rash only in the evening. She gets redness on her face and eyebrows and nose and cheeks. Accompanied by swelling in the area. She also gets itching and burning pains. This is different that her usual trigeminal neuralgia. She has not tried anything for it except some lotion on it. She does not have the swelling during the day. She feels some of the redness is still present. No fevers or chills. No swelling around or in her mouth and no SOB or problems swallowing.  The other new problem she is having is pain on her right hip. Started in the last several weeks and she is not having any rash there. She denies injury to the area. She can move it but painful to lay on and very stiff to raise and walk. Denies injury to the area. No pain in the groin.   Review of Systems  Constitutional: Positive for activity change. Negative for appetite change, chills, fatigue, fever and unexpected weight change.  HENT: Positive for facial swelling and postnasal drip. Negative for congestion, dental problem, drooling, ear discharge, ear pain, rhinorrhea, sinus pain, sinus pressure, sore throat, trouble swallowing and voice change.   Eyes: Positive for visual disturbance.       Chronic but not changed since rash  Respiratory: Negative.   Cardiovascular: Negative.   Gastrointestinal: Negative.   Musculoskeletal: Positive for arthralgias, gait problem and myalgias. Negative for back pain and joint swelling.  Skin: Positive for color change and rash.  Neurological: Negative for dizziness, facial asymmetry, weakness, light-headedness, numbness and headaches.      Objective:   Physical Exam    Constitutional: She is oriented to person, place, and time. She appears well-developed and well-nourished. No distress.  HENT:  Head: Normocephalic and atraumatic.  Right Ear: External ear normal.  Left Ear: External ear normal.  Nose: Nose normal.  Mouth/Throat: Oropharynx is clear and moist.  Eyes: EOM are normal.  No swelling in the face noted on exam, if there is a rash it is very fine and not prominent  Neck: Normal range of motion. Neck supple. No JVD present.  Cardiovascular: Normal rate and regular rhythm.   Pulmonary/Chest: Effort normal and breath sounds normal.  Abdominal: Soft.  Musculoskeletal: She exhibits tenderness.  Pain over the right trochanteric bursa, very tender to touch.   Lymphadenopathy:    She has no cervical adenopathy.  Neurological: She is alert and oriented to person, place, and time.  Skin: Skin is warm and dry.    Vitals:   10/26/16 0956 10/26/16 1034  BP: (!) 160/84 (!) 158/84  Pulse: 95   Temp: 99.4 F (37.4 C)   TempSrc: Oral   SpO2: 98%   Weight: 242 lb (109.8 kg)   Height: 5\' 6"  (1.676 m)       Assessment & Plan:

## 2016-10-26 NOTE — Telephone Encounter (Signed)
Spoke to pt. She noted an L hand area, like a mosquito bite, with itching about 2 wks ago, she says running hot water over this usually helps this aand she said it did not, and has then since noted a rash on her face, itching, red, inflamed, some scaliness, around eyebrows, nose cheek,  She has used lotion thinking it dry skin and that made things worse.  Using neosporin ointment helps for about 2 hours.   She is concerned since she is taking lamictal 100mg  po am and 200mg  po pm.  She stated she has been on this for 2 months now.  No other areas of her body.  She has appt with Dr. Sharlet Salina for evaluation at 1000, I told her to keep this appt and I would forward this to Dr. Krista Blue.  We see pt for trigeminal neuralgia.  She has noted pain in her L arm as well.

## 2016-10-28 LAB — LAMOTRIGINE LEVEL: Lamotrigine Lvl: 12.9 ug/mL (ref 4.0–18.0)

## 2016-10-29 LAB — ANA: Anti Nuclear Antibody(ANA): NEGATIVE

## 2016-10-31 ENCOUNTER — Other Ambulatory Visit: Payer: Self-pay | Admitting: *Deleted

## 2016-10-31 ENCOUNTER — Telehealth: Payer: Self-pay | Admitting: *Deleted

## 2016-10-31 MED ORDER — GABAPENTIN 600 MG PO TABS: ORAL_TABLET | ORAL | 11 refills | Status: DC

## 2016-10-31 NOTE — Telephone Encounter (Signed)
Spoke to patient - she is currently taking gabapentin 300mg  - 4 caps in am, 2 caps in afternoon, 4 caps in evening.  The number of capsules in one day exceeds her insurance plan limitations.  Dr. Krista Blue has provided a new prescription for gabapentin 600mg  - 2 caps in am, 1 cap in afternoon, 2 caps in evening.  Pt and pharmacy aware of change.

## 2016-10-31 NOTE — Telephone Encounter (Signed)
Request received from pharmacy to clarify her gabapentin dosage.

## 2016-11-01 NOTE — Telephone Encounter (Signed)
New dosage sent to pharmacy to meet insurance requirements.  Patient aware and agreeable to the change (see note on 10/31/16).  Called pharmacy - they were still running the old rx - previous prescriptions voided and pharmacy profile updated to reflect correct dosage.

## 2016-11-01 NOTE — Telephone Encounter (Signed)
Sunday Spillers with [facility] is calling to advise that any mg of Gabapentin for the patient needs PA.

## 2016-11-01 NOTE — Telephone Encounter (Signed)
Called [employer] [phone]) - her gabapentin did not require a PA - there was a clerical error that has now been corrected - she is able to get her prescription at the pharmacy.  Returned call to [facility] so they can get the patient's medication ready for her.

## 2016-11-01 NOTE — Telephone Encounter (Signed)
Pt called said she was advised by pharmacy medication will need PA since daily qty has changed.

## 2016-11-05 ENCOUNTER — Telehealth: Payer: Self-pay | Admitting: Internal Medicine

## 2016-11-05 NOTE — Telephone Encounter (Signed)
Pt called back to speak with Rn about new directions for medication. Please call

## 2016-11-05 NOTE — Telephone Encounter (Signed)
Spoke to patient - she is aware that the new prescription has been sent to the pharmacy and the insurance issue has been resolved.

## 2016-11-05 NOTE — Telephone Encounter (Signed)
Patient contacted and stated awareness, has apt with neurologist

## 2016-11-05 NOTE — Telephone Encounter (Signed)
Pt called in and said that the meds she was given to help her face is not working.  What else can she do?  Also she wants to know if someone can call her with her lab results    Best number [phone]

## 2016-11-05 NOTE — Telephone Encounter (Signed)
Has she seen her neurologist? She was supposed to see them shortly after our visit to see if her meds are causing the problem. See result note for lab results.

## 2016-11-05 NOTE — Telephone Encounter (Signed)
Please advise 

## 2016-12-07 ENCOUNTER — Telehealth: Payer: Self-pay | Admitting: Neurology

## 2016-12-07 ENCOUNTER — Ambulatory Visit: Payer: Federal, State, Local not specified - PPO | Admitting: Nurse Practitioner

## 2016-12-07 ENCOUNTER — Ambulatory Visit: Payer: Self-pay | Admitting: Nurse Practitioner

## 2016-12-07 NOTE — Telephone Encounter (Signed)
Patient called within the 24 hr time frame of appointment.  Patient has been advised there is a $50 reschedule fee and a message has been sent to billing department the patient would then be contacted to reschedule the appointment.

## 2016-12-10 ENCOUNTER — Encounter: Payer: Self-pay | Admitting: Nurse Practitioner

## 2016-12-21 ENCOUNTER — Encounter: Payer: Self-pay | Admitting: Nurse Practitioner

## 2016-12-21 ENCOUNTER — Ambulatory Visit (INDEPENDENT_AMBULATORY_CARE_PROVIDER_SITE_OTHER): Payer: Federal, State, Local not specified - PPO | Admitting: Nurse Practitioner

## 2016-12-21 VITALS — BP 130/75 | HR 86 | Wt 238.5 lb

## 2016-12-21 DIAGNOSIS — R413 Other amnesia: Secondary | ICD-10-CM | POA: Diagnosis not present

## 2016-12-21 DIAGNOSIS — R51 Headache: Secondary | ICD-10-CM | POA: Diagnosis not present

## 2016-12-21 DIAGNOSIS — R519 Headache, unspecified: Secondary | ICD-10-CM

## 2016-12-21 DIAGNOSIS — R251 Tremor, unspecified: Secondary | ICD-10-CM

## 2016-12-21 DIAGNOSIS — R269 Unspecified abnormalities of gait and mobility: Secondary | ICD-10-CM

## 2016-12-21 DIAGNOSIS — G44319 Acute post-traumatic headache, not intractable: Secondary | ICD-10-CM | POA: Diagnosis not present

## 2016-12-21 HISTORY — DX: Headache, unspecified: R51.9

## 2016-12-21 HISTORY — DX: Other amnesia: R41.3

## 2016-12-21 HISTORY — DX: Tremor, unspecified: R25.1

## 2016-12-21 NOTE — Patient Instructions (Signed)
MRI of the brain MRI of the cervical spine  Labs today  Follow up in 3 months with Dr. Krista Blue

## 2016-12-21 NOTE — Progress Notes (Signed)
[facility]  PATIENT: [name] DOB: August 14, 1951   REASON FOR VISIT: Follow-up for mild cognitive impairment, history of trigeminal neuralgia, new complaint of tremor, balance issues and temporal headache HISTORY FROM: Patient    HISTORY OF PRESENT ILLNESS:HISTORYMs. Mazzaferro is a 66 years old right-handed African American female, referred by her primary care physician Dr. Stevie Kern for evaluation of right facial pain  She was diagnosed with right trigeminal neuralgia in 2006, presenting with severe electronics shooting pain at her right upper molar, she had microvascular decompression surgery at [facility] by Dr. Harrison Mons around 2006, which has helped the severe pain, but she continued to have annoying almost constant pain at her right face, she describes hot cold sensitivity over right face, touching of right forehead region will also set of her right facial pain, she felt right tongue swelling, she bites on her right tongue sometimes, she works at customer service, on the phone many hours each day, with prolonged talking, she felt sandpaper was rubbing against her right tongue, also complains of right upper and lower lip numbness, intermittent electronics shooting pain,  Over the years, she has tried different medication, was evaluated by our office Dr. Brett Fairy in 2008, she has tried gabapentin, Lyrica, Cymbalta, Trileptal, currently is taking Tegretol 200 mg twice a day, initially it helps her some, but failed to help work consistently, She denied visual change, recent few months of left lower back, left hip pain, radiating to left posterior thigh, to her left leg and left foot, difficulty walking after prolonged sitting She complains of left low back pain, left hip pain, radiating pain to left foot, like walking on a dead, burning sensation, she continued to complains of right facial constant achy pain, there was no significant change after started on  Oxtellar xR 300 mg every day, no significant side effects,  I have reviewed MRI, mild small vessel disease, no significant abnormality otherwise,  She had MRI lumbar spine (without) demonstrating: L5-S1: facet hypertrophy with moderate right and severe left foraminal stenosis  L4-5: pseudo disc bulging and facet hypertrophy with moderate right and mild left foraminal stenosis. There is 2 mm anterior spondylolisthesis of L4 on L5.  Degenerative spondylosis and disc bulging L1-2 and L4-5.  UPDATE Sep 30 2014: She felt sand paper sensation in her right tongue, flashing sensation, last 1-2 weeks, only take gabapentin 391m tid now. She also complains of snoring, frequent wakening during the nighttime, excessive sleepiness at daytime, F ESS score is 37, ESS is 9  UPDATE 10/06/2015 Ms. LBlow 66year old female returns for follow-up. She has a history of right trigeminal neuralgia is currently on Neurontin 300 mg 3 times a day has breakthrough pain. 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 She occasionally will have upper and lower lip numbness. 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,  Update March 01 2016: She presented to the emergency room February 21 2016 for nausea, unexpected fall, night before that because severe facial pain, she took extra dose of lamotrigine, made it to 250 mg that particular day,  laboratory evaluation showed mildly low potassium of CMP WAS GIVEN SUPPLEMENT, NORMAL CBC, lamotrigine level in June 15 was 15.6,  For a while, she had severe right facial pain, was taking  gabapentin 300 mg 2 tablets every 2 hours, 3 tablets at night, about 11 tablets each day 3300 mg every day plus lamotrigine 100 mg twice a day,   Lamotrigine 100 mg twice a day has been very helpful, it has taking care of her long bothered right tongue sandpaper sensation. She is now taking gabapentin 300 mg 3/2/3 tablets each day, she has been off lamotrigine since February 26 2016, the sandpaper sensation on the right side of the tongue came back, she is now taking Cymbalta 30 mg 3 times a day  She has been complains 3 weeks history of urinary urgency, difficulty initiating urine, UA showed mild UTI, was treated with Cipro without improving her symptoms  UPDATE May 10 2016: She was evaluated by Dr. Salomon Fick in July 2017, is planning on to have gamma knife in early September, she complains of mild memory loss, difficulty handling her computer tasks sometimes, there was no family history of memory loss, she also complains of episode, right after she get out of the bed,  She is on polypharmacy, now taking lamotrigine 100 mg twice a day, gabapentin 300 mg 4 in the morning, 2 at lunch time, 4 tablets at nighttime. She also has obesity, snoring, excessive daytime fatigue and sleepiness  We have personally reviewed CT head without contrast in June 2017, no acute abnormality, MRI of the cervical in 2008, multilevel degenerative changes, most severe at C5-6, C6-7, with herniated disc, mild canal stenosis, mild cord signal changes.  Laboratory evaluation in 2017, elevated WBC 11 point 8, hemoglobin of 13, no significant abnormality on BMP UPDATE 04/13/2018CM Ms. Pasley, 66 year old female returns for follow-up for history of mild cognitive impairment which is stable and trigeminal neuralgia. She had gamma knife surgery in September but still has some facial pain. In addition she complains with tremor which she claims is new, balance issues and a headache in the right temporal area which is been going on  for about 2 weeks. She denies any problems with swallowing or chewing. She continues to drive but states she has gotten lost on occasion she returns for reevaluation     REVIEW OF SYSTEMS: Full 14 system review of systems performed and notable only for those listed, all others are neg:  Constitutional: neg  Cardiovascular: neg Ear/Nose/Throat: Drooling  Skin: neg Eyes: neg Respiratory: neg Gastroitestinal: neg  Hematology/Lymphatic: neg  Endocrine: neg Musculoskeletal: Balance issues, walking difficulty, joint pain neck pain Allergy/Immunology: neg Neurological: Tremor, memory loss Psychiatric: neg Sleep : neg   ALLERGIES: Allergies  Allergen Reactions  . Clindamycin Hcl Itching    itch    HOME MEDICATIONS: Outpatient Medications Prior to Visit  Medication Sig Dispense Refill  . gabapentin (NEURONTIN) 600 MG tablet Take two capsules in am, one capsule in afternoon, two capsules in evening. 150 tablet 11  . lamoTRIgine (LAMICTAL) 100 MG tablet One in the morning, 2 tabs at night 270 tablet 0  . predniSONE (DELTASONE) 20 MG tablet Take 2 tablets (40 mg total) by mouth daily with breakfast. (Patient not taking: Reported on 12/21/2016) 10 tablet 0   No facility-administered medications prior to visit.     PAST MEDICAL HISTORY: Past Medical History:  Diagnosis Date  . Arthritis shoulders and back  . 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 Diverticulosis   . Fibromyalgia   . History of kidney stones   . History of kidney stones   . History of shingles 2012--  no residual pain  . Left ureteral calculus   . MCI (mild cognitive impairment)   . Trigeminal neuralgia RIGHT  . Urgency of urination     PAST SURGICAL HISTORY: Past Surgical History:  Procedure Laterality Date  . CEREBRAL MICROVASCULAR DECOMPRESSION  07-05-2006   RIGHT TRIGEMINAL NERVE  . CYSTOSCOPY/RETROGRADE/URETEROSCOPY/STONE EXTRACTION WITH BASKET   07/04/2012   Procedure: CYSTOSCOPY/RETROGRADE/URETEROSCOPY/STONE EXTRACTION WITH BASKET;  Surgeon: Claybon Jabs, MD;  Location: [facility];  Service: Urology;  Laterality: Left;  . EXPLORATORY LAPAROTOMY/ RESECTION MID TO DISTAL SIGMOID AND PROXIMAL RECTUM/ END PROXIMAL SIGMOID COLOSTOMY  07-17-2006   PERFORATED DIVERTICULITIS WITH PERITONITIS  . gamma knife  05/17/2016   for trigeminal neuralgia, [facility], Dr Salomon Fick  . RESECTION COLOSTOMY/ CLOSURE COLOSTOMY WITH COLOPROCTOSTOMY  02-20-2007  . TONSILLECTOMY  AS CHILD  . URETEROSCOPY  07/04/2012   Procedure: URETEROSCOPY;  Surgeon: Claybon Jabs, MD;  Location: [facility];  Service: Urology;  Laterality: Left;  Marland Kitchen VAGINAL HYSTERECTOMY  1998   Partial  . WISDOM TOOTH EXTRACTION      FAMILY HISTORY: Family History  Problem Relation Age of Onset  . Heart disease Maternal Grandmother   . Diabetes Mother   . Breast cancer Maternal Aunt   . Lupus Cousin   . Colon cancer Neg Hx   . Esophageal cancer Neg Hx   . Rectal cancer Neg Hx   . Stomach cancer Neg Hx     SOCIAL HISTORY: Social History   Social History  . Marital status: Married    Spouse name: N/A  . Number of children: 1  . Years of education: College   Occupational History  .  Unemployed  . HUMAN RESOURCES  [employer]   Social History Main Topics  . Smoking status: Never Smoker  . Smokeless tobacco: Never Used  . Alcohol use Yes     Comment: RARE  . Drug use: No  . Sexual activity: Not on file   Other Topics Concern  . Not on file   Social History Narrative  . No narrative on file     PHYSICAL EXAM  Vitals:   12/21/16 0841  BP: 130/75  Pulse: 86  Weight: 238 lb 8 oz (108.2 kg)   Body mass index is 38.49 kg/m.  Generalized: Well developed,Obese female in no acute distress , well-groomed Head: normocephalic and atraumatic,. Oropharynx benign  Neck: Supple, no carotid bruits  Cardiac: Regular rate rhythm, no  murmur  Musculoskeletal: No deformity   Neurological examination   Mentation: Alert AFT 4. Clock drawing 2/4. MMSE - Mini Mental State Exam 12/21/2016 05/10/2016  Orientation to time 4 5  Orientation to Place 5 5  Registration 3 3  Attention/ Calculation 5 5  Recall 2 1  Language- name 2 objects 2 2  Language- repeat 1 1  Language- follow 3 step command 3 3  Language- read & follow direction 1 1  Write a sentence 1 1  Copy design 0 1  Total score 27 28    Follows all commands speech and language fluent.   Cranial nerve II-XII: Fundoscopic exam reveals sharp disc margins.Pupils were equal round reactive to light extraocular movements were full, visual field were full on confrontational test. Facial sensation and strength were normal. hearing was  intact to finger rubbing bilaterally. Uvula tongue midline. head turning and shoulder shrug were normal and symmetric.Tongue protrusion into cheek strength was normal. Motor: normal bulk and tone, full strength in the BUE, BLE, fine finger movements normal, no pronator drift. No focal weakness Sensory: normal and symmetric to light touch, pinprick, and  Vibration, in the upper and lower extremities Coordination: finger-nose-finger, heel-to-shin bilaterally, no dysmetria, no resting tremor Reflexes: Brachioradialis 2/2, biceps 2/2, triceps 2/2, patellar 2/2, Achilles 2/2, plantar responses were flexor bilaterally. Gait and Station: Rising up from seated position without assistance, wide based  stance,  moderate stride, good arm swing, smooth turning, able to perform tiptoe, and heel walking without difficulty. Tandem gait is un steady  DIAGNOSTIC DATA (LABS, IMAGING, TESTING) - I reviewed patient records, labs, notes, testing and imaging myself where available.  Lab Results  Component Value Date   WBC 7.6 10/26/2016   HGB 12.3 10/26/2016   HCT 37.5 10/26/2016   MCV 85.0 10/26/2016   PLT 376.0 10/26/2016      Component Value Date/Time   NA  140 10/26/2016 1036   K 3.6 10/26/2016 1036   CL 102 10/26/2016 1036   CO2 30 10/26/2016 1036   GLUCOSE 95 10/26/2016 1036   GLUCOSE 96 08/20/2006 1433   BUN 11 10/26/2016 1036   CREATININE 0.94 10/26/2016 1036   CALCIUM 9.3 10/26/2016 1036   PROT 7.2 10/26/2016 1036   ALBUMIN 4.1 10/26/2016 1036   AST 15 10/26/2016 1036   ALT 10 10/26/2016 1036   ALKPHOS 83 10/26/2016 1036   BILITOT 0.4 10/26/2016 1036   GFRNONAA >60 07/13/2016 1155   GFRAA >60 07/13/2016 1155   Lab Results  Component Value Date   CHOL 210 (H) 10/27/2015   HDL 65.80 10/27/2015   LDLCALC 133 (H) 10/27/2015   LDLDIRECT 141.1 10/01/2013   TRIG 58.0 10/27/2015   CHOLHDL 3 10/27/2015   Lab Results  Component Value Date   HGBA1C 6.1 10/27/2015   Lab Results  Component Value Date   VITAMINB12 422 05/10/2016   Lab Results  Component Value Date   TSH 1.27 10/26/2016      ASSESSMENT AND PLAN  66 y.o. year old female  has a past medical history of Arthritis (shoulders and back); Trigeminal neuralgia with recent Gamma Knife , mild cognitive impairment, continued falling  episodes , and new  complaint of tremor  and right-sided temporal headache               PLAN: MRI of the brain, new onset tremor MRI of the cervical spine  for continued complaints of unsteady gait falling episodes Labs today  Sed rate and C-reactive protein Continue  lamotrigine 100 mg in the morning, 200 mg at night,  Continue gabapentin to 300 mg 2 tablets 3 times a day Follow up in 3 months with Dr. Luan Pulling, [facility], [facility], [facility] [address] [place], [place] [address] [phone]

## 2016-12-22 LAB — SEDIMENTATION RATE: Sed Rate: 9 mm/hr (ref 0–40)

## 2016-12-22 LAB — C-REACTIVE PROTEIN: CRP: 7.8 mg/L — ABNORMAL HIGH (ref 0.0–4.9)

## 2016-12-26 NOTE — Progress Notes (Signed)
I have reviewed and agreed above plan. 

## 2016-12-27 ENCOUNTER — Telehealth: Payer: Self-pay | Admitting: *Deleted

## 2016-12-27 NOTE — Telephone Encounter (Signed)
Attempted to reach patient on home phone; no answer and voice mailbox not set up. Called mobile number; no answer and voice mailbox full. Will try to reach later.

## 2016-12-31 NOTE — Telephone Encounter (Signed)
Per Daun Peacock, NP LVM informing patient that her labs are ok. She can take Tylenol as needed for headache. Repeated this message and left phone number for any questions.

## 2017-01-04 ENCOUNTER — Ambulatory Visit
Admission: RE | Admit: 2017-01-04 | Discharge: 2017-01-04 | Disposition: A | Discharge status: Home / Self Care | Payer: Federal, State, Local not specified - PPO | Source: Ambulatory Visit | Attending: Nurse Practitioner | Admitting: Nurse Practitioner

## 2017-01-04 DIAGNOSIS — R251 Tremor, unspecified: Secondary | ICD-10-CM

## 2017-01-04 DIAGNOSIS — R269 Unspecified abnormalities of gait and mobility: Secondary | ICD-10-CM | POA: Diagnosis not present

## 2017-01-04 DIAGNOSIS — G44319 Acute post-traumatic headache, not intractable: Secondary | ICD-10-CM

## 2017-01-04 DIAGNOSIS — R413 Other amnesia: Secondary | ICD-10-CM

## 2017-01-04 MED ORDER — GADOBENATE DIMEGLUMINE 529 MG/ML IV SOLN
20.0000 mL | Freq: Once | INTRAVENOUS | Status: AC | PRN
  Administered 2017-01-04: 20 mL via INTRAVENOUS

## 2017-01-08 ENCOUNTER — Other Ambulatory Visit: Payer: Self-pay | Admitting: Neurology

## 2017-02-08 ENCOUNTER — Telehealth: Payer: Self-pay | Admitting: Internal Medicine

## 2017-02-08 ENCOUNTER — Encounter (HOSPITAL_COMMUNITY): Payer: Self-pay | Admitting: Emergency Medicine

## 2017-02-08 ENCOUNTER — Emergency Department (HOSPITAL_COMMUNITY): Payer: Federal, State, Local not specified - PPO

## 2017-02-08 ENCOUNTER — Emergency Department (HOSPITAL_COMMUNITY)
Admission: EM | Admit: 2017-02-08 | Discharge: 2017-02-08 | Disposition: A | Discharge status: Home / Self Care | Payer: Federal, State, Local not specified - PPO | Attending: Emergency Medicine | Admitting: Emergency Medicine

## 2017-02-08 DIAGNOSIS — N200 Calculus of kidney: Secondary | ICD-10-CM | POA: Diagnosis not present

## 2017-02-08 DIAGNOSIS — Z79899 Other long term (current) drug therapy: Secondary | ICD-10-CM | POA: Diagnosis not present

## 2017-02-08 DIAGNOSIS — R109 Unspecified abdominal pain: Secondary | ICD-10-CM

## 2017-02-08 DIAGNOSIS — R0789 Other chest pain: Secondary | ICD-10-CM | POA: Diagnosis not present

## 2017-02-08 DIAGNOSIS — N83202 Unspecified ovarian cyst, left side: Secondary | ICD-10-CM | POA: Insufficient documentation

## 2017-02-08 DIAGNOSIS — R3129 Other microscopic hematuria: Secondary | ICD-10-CM

## 2017-02-08 LAB — POCT I-STAT TROPONIN I
Troponin i, poc: 0 ng/mL (ref 0.00–0.08)
Troponin i, poc: 0 ng/mL (ref 0.00–0.08)

## 2017-02-08 LAB — COMPREHENSIVE METABOLIC PANEL
ALT: 11 U/L — ABNORMAL LOW (ref 14–54)
AST: 17 U/L (ref 15–41)
Albumin: 3.6 g/dL (ref 3.5–5.0)
Alkaline Phosphatase: 80 U/L (ref 38–126)
Anion gap: 5 (ref 5–15)
BUN: 14 mg/dL (ref 6–20)
CO2: 29 mmol/L (ref 22–32)
Calcium: 8.9 mg/dL (ref 8.9–10.3)
Chloride: 108 mmol/L (ref 101–111)
Creatinine, Ser: 0.84 mg/dL (ref 0.44–1.00)
GFR calc Af Amer: >60 mL/min (ref >60)
GFR calc non Af Amer: >60 mL/min (ref >60)
Glucose, Bld: 86 mg/dL (ref 65–99)
Potassium: 3.9 mmol/L (ref 3.5–5.1)
Sodium: 142 mmol/L (ref 135–145)
Total Bilirubin: 0.7 mg/dL (ref 0.3–1.2)
Total Protein: 7 g/dL (ref 6.5–8.1)

## 2017-02-08 LAB — URINALYSIS, ROUTINE W REFLEX MICROSCOPIC
Bilirubin Urine: NEGATIVE
Glucose, UA: NEGATIVE mg/dL
Ketones, ur: NEGATIVE mg/dL
Nitrite: NEGATIVE
Protein, ur: NEGATIVE mg/dL
Specific Gravity, Urine: 1.029 (ref 1.005–1.030)
pH: 5 (ref 5.0–8.0)

## 2017-02-08 LAB — CBC WITH DIFFERENTIAL/PLATELET
Basophils Absolute: 0.1 10*3/uL (ref 0.0–0.1)
Basophils Relative: 1 %
Eosinophils Absolute: 0.2 10*3/uL (ref 0.0–0.7)
Eosinophils Relative: 2 %
HCT: 36.9 % (ref 36.0–46.0)
Hemoglobin: 11.9 g/dL — ABNORMAL LOW (ref 12.0–15.0)
Lymphocytes Relative: 33 %
Lymphs Abs: 2.5 10*3/uL (ref 0.7–4.0)
MCH: 28 pg (ref 26.0–34.0)
MCHC: 32.2 g/dL (ref 30.0–36.0)
MCV: 86.8 fL (ref 78.0–100.0)
Monocytes Absolute: 0.7 10*3/uL (ref 0.1–1.0)
Monocytes Relative: 9 %
Neutro Abs: 4.2 10*3/uL (ref 1.7–7.7)
Neutrophils Relative %: 55 %
Platelets: 345 10*3/uL (ref 150–400)
RBC: 4.25 MIL/uL (ref 3.87–5.11)
RDW: 14.6 % (ref 11.5–15.5)
WBC: 7.6 10*3/uL (ref 4.0–10.5)

## 2017-02-08 LAB — CBC
HCT: 39.6 % (ref 36.0–46.0)
Hemoglobin: 12.7 g/dL (ref 12.0–15.0)
MCH: 27.6 pg (ref 26.0–34.0)
MCHC: 32.1 g/dL (ref 30.0–36.0)
MCV: 86.1 fL (ref 78.0–100.0)
Platelets: 374 10*3/uL (ref 150–400)
RBC: 4.6 MIL/uL (ref 3.87–5.11)
RDW: 14.6 % (ref 11.5–15.5)
WBC: 6.7 10*3/uL (ref 4.0–10.5)

## 2017-02-08 LAB — BASIC METABOLIC PANEL
Anion gap: 7 (ref 5–15)
BUN: 15 mg/dL (ref 6–20)
CO2: 28 mmol/L (ref 22–32)
Calcium: 9.1 mg/dL (ref 8.9–10.3)
Chloride: 106 mmol/L (ref 101–111)
Creatinine, Ser: 0.88 mg/dL (ref 0.44–1.00)
GFR calc Af Amer: >60 mL/min (ref >60)
GFR calc non Af Amer: >60 mL/min (ref >60)
Glucose, Bld: 95 mg/dL (ref 65–99)
Potassium: 3.7 mmol/L (ref 3.5–5.1)
Sodium: 141 mmol/L (ref 135–145)

## 2017-02-08 NOTE — ED Triage Notes (Signed)
Patient c/o intermittent central chest pain x 2 weeks.  Patient also c/o left side pain, mainly hurts when patient lays on left side. Patient reports PMH diverticulitis. Patient also reports having intermittent problems with urination.

## 2017-02-08 NOTE — ED Provider Notes (Signed)
[facility] DEPT Provider Note   CSN: [id number] Arrival date & time: 02/08/17  1119     History   Chief Complaint Chief Complaint  Patient presents with  . Chest Pain  . Flank Pain    HPI Summer [name] is a 66 y.o. female.  Patient with past medical history of diverticulitis, fibromyalgia, trigeminal neuralgia, hysterectomy, bowel resection, presents with gradual onset LLQ/flank pain that began a few days ago. Reports pain is minimal at rest, however sharp with turning onto her left side. No associated nausea, vomiting, blood in her stool. Patient states she normally takes Mylanta when she feels she may be developing an diverticulitis flare and normally has relief from this. States she reports here today because she is worried that she is having diverticulitis flare. Also reports intermittent sharp central chest pain 2 weeks. Patient reports symptoms last a few seconds, during these episodes she feels short of breath and anxious. Chest pain does not radiate to left arm jaw or back, not associated with exertion. Denies dysuria, hematuria, fevers, cough, shortness of breath, diaphoresis, headache, vision changes.      Past Medical History:  Diagnosis Date  . Arthritis shoulders and back  . 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 Diverticulosis   . Fibromyalgia   . History of kidney stones   . History of kidney stones   . History of shingles 2012--  no residual pain  . Left ureteral calculus   . MCI (mild cognitive impairment)   . Trigeminal neuralgia RIGHT  . Urgency of urination     Patient Active Problem List   Diagnosis Date Noted  . Memory loss 12/21/2016  . Tremor 12/21/2016  . Right sided temporal headache 12/21/2016  . Trochanteric bursitis, right hip 10/26/2016  . Facial rash 10/26/2016  . Foot sprain, left, initial encounter 08/31/2016  . Pain of right side of body 08/31/2016  . Headache 04/24/2016  .  LLQ pain 04/24/2016  . Glossitis 02/02/2016  . Routine general medical examination at a health care facility 10/27/2015  . Facial pain 10/06/2015  . Trigeminal neuralgia of right side of face 11/30/2010  . Morbid obesity ([place]) 11/09/2010  . Fibromyalgia 11/09/2010  . HEMATURIA, HX OF 11/14/2009  . LOC OSTEOARTHROS NOT SPEC PRIM/SEC OTH [facility] SITE 06/27/2009  . Allergic rhinitis 08/26/2008  . 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 CEREBRAL MICROVASCULAR DECOMPRESSION  07-05-2006   RIGHT TRIGEMINAL NERVE  . CYSTOSCOPY/RETROGRADE/URETEROSCOPY/STONE EXTRACTION WITH BASKET  07/04/2012   Procedure: CYSTOSCOPY/RETROGRADE/URETEROSCOPY/STONE EXTRACTION WITH BASKET;  Surgeon: Claybon Jabs, MD;  Location: [facility];  Service: Urology;  Laterality: Left;  . EXPLORATORY LAPAROTOMY/ RESECTION MID TO DISTAL SIGMOID AND PROXIMAL RECTUM/ END PROXIMAL SIGMOID COLOSTOMY  07-17-2006   PERFORATED DIVERTICULITIS WITH PERITONITIS  . gamma knife  05/17/2016   for trigeminal neuralgia, [facility], Dr Salomon Fick  . RESECTION COLOSTOMY/ CLOSURE COLOSTOMY WITH COLOPROCTOSTOMY  02-20-2007  . TONSILLECTOMY  AS CHILD  . URETEROSCOPY  07/04/2012   Procedure: URETEROSCOPY;  Surgeon: Claybon Jabs, MD;  Location: [facility];  Service: Urology;  Laterality: Left;  Marland Kitchen VAGINAL HYSTERECTOMY  1998   Partial  . WISDOM TOOTH EXTRACTION      OB History    No data available       Home Medications    Prior to Admission medications   Medication Sig Start  Date End Date Taking? Authorizing Provider  acetaminophen (TYLENOL) 500 MG tablet Take 1,000 mg by mouth every 6 (six) hours as needed.   Yes [provider]  Aspirin-Acetaminophen-Caffeine (GOODY HEADACHE PO) Take 1 packet by mouth daily as needed (headache).   Yes [provider]  ciclopirox (LOPROX) 0.77 % cream  12/03/16  Yes [provider]    gabapentin (NEURONTIN) 600 MG tablet Take two capsules in am, one capsule in afternoon, two capsules in evening. 10/31/16  Yes Marcial Pacas, MD  hydrocortisone 2.5 % cream  11/30/16  Yes [provider]  ketoconazole (NIZORAL) 2 % shampoo  11/30/16  Yes [provider]  lamoTRIgine (LAMICTAL) 100 MG tablet TAKE 1 TABLET BY MOUTH EVERY MORNING AND TAKE 2 TABLETS BY MOUTH AT NIGHT Patient taking differently: TAKE 1 TABLET BY MOUTH EVERY MORNING AND TAKE 1 TABLET BY MOUTH AT NIGHT 01/08/17  Yes Marcial Pacas, MD    Family History Family History  Problem Relation Age of Onset  . Heart disease Maternal Grandmother   . Diabetes Mother   . Breast cancer Maternal Aunt   . Lupus Cousin   . Colon cancer Neg Hx   . Esophageal cancer Neg Hx   . Rectal cancer Neg Hx   . Stomach cancer Neg Hx     Social History Social History  Substance Use Topics  . Smoking status: Never Smoker  . Smokeless tobacco: Never Used  . Alcohol use Yes     Comment: RARE     Allergies   Clindamycin hcl   Review of Systems Review of Systems  Constitutional: Negative for appetite change, chills, diaphoresis and fever.  HENT: Negative for trouble swallowing.   Respiratory: Negative for shortness of breath.   Cardiovascular: Positive for chest pain.  Gastrointestinal: Positive for abdominal pain. Negative for blood in stool, constipation, diarrhea, nausea and vomiting.  Genitourinary: Positive for flank pain.  Musculoskeletal: Negative for back pain.  Skin: Negative for color change.  Neurological: Negative for headaches.  Psychiatric/Behavioral: The patient is nervous/anxious.      Physical Exam Updated Vital Signs BP 115/61   Pulse 85   Temp 97.8 F (36.6 C) (Oral)   Resp 18   SpO2 96%   Physical Exam  Constitutional: She appears well-developed and well-nourished.  HENT:  Head: Normocephalic and atraumatic.  Eyes: Conjunctivae are normal.  Cardiovascular: Normal rate, regular rhythm,  normal heart sounds and intact distal pulses.  Exam reveals no friction rub.   No murmur heard. Pulmonary/Chest: Effort normal and breath sounds normal. No respiratory distress. She has no wheezes. She has no rales. She exhibits no tenderness.  Abdominal: Soft. Bowel sounds are normal. She exhibits no distension. There is tenderness (Left flank and left lower quadrant). There is no rebound, no guarding and no tenderness at McBurney's point.  No CVA tenderness to percussion  Neurological: She is alert.  Skin: Skin is warm.  Psychiatric: She has a normal mood and affect. Her behavior is normal.  Nursing note and vitals reviewed.    ED Treatments / Results  Labs (all labs ordered are listed, but only abnormal results are displayed) Labs Reviewed  URINALYSIS, ROUTINE W REFLEX MICROSCOPIC - Abnormal; Notable for the following:       Result Value   APPearance HAZY (*)    Hgb urine dipstick MODERATE (*)    Leukocytes, UA TRACE (*)    Bacteria, UA MANY (*)    Squamous Epithelial / LPF 0-5 (*)  All other components within normal limits  COMPREHENSIVE METABOLIC PANEL - Abnormal; Notable for the following:    ALT 11 (*)    All other components within normal limits  CBC WITH DIFFERENTIAL/PLATELET - Abnormal; Notable for the following:    Hemoglobin 11.9 (*)    All other components within normal limits  BASIC METABOLIC PANEL  CBC  I-STAT TROPOININ, ED  POCT I-STAT TROPONIN I  I-STAT TROPOININ, ED  POCT I-STAT TROPONIN I    EKG  EKG Interpretation  Date/Time:  Friday February 08 2017 11:28:25 EDT Ventricular Rate:  77 PR Interval:    QRS Duration: 114 QT Interval:  399 QTC Calculation: 452 R Axis:   -25 Text Interpretation:  Sinus rhythm Atrial premature complex Incomplete left bundle branch block Left ventricular hypertrophy Anterior Q waves, possibly due to LVH since last tracing no significant change Confirmed by Malvin Johns [phone]) on 02/08/2017 1:20:25 PM        Radiology Dg Chest 2 View  Result Date: 02/08/2017 CLINICAL DATA:  Chest pain. EXAM: CHEST  2 VIEW COMPARISON:  10/20/2009. FINDINGS: Mediastinum hilar structures normal. Heart size stable . No focal infiltrate. Stable mild elevation left hemidiaphragm. No pleural effusion or pneumothorax. Degenerative changes thoracic spine and both shoulders. IMPRESSION: Stable elevation left hemidiaphragm. No acute cardiopulmonary disease identified. Electronically Signed   By: Marcello Moores  Register   On: 02/08/2017 11:54   Ct Renal Stone Study  Result Date: 02/08/2017 CLINICAL DATA:  Left flank pain EXAM: CT ABDOMEN AND PELVIS WITHOUT CONTRAST TECHNIQUE: Multidetector CT imaging of the abdomen and pelvis was performed following the standard protocol without IV contrast. COMPARISON:  02/28/2016 FINDINGS: Lower chest:  No contributory findings. Hepatobiliary: No focal liver abnormality.No evidence of biliary obstruction or stone. Pancreas: Unremarkable. Spleen: Unremarkable. Adrenals/Urinary Tract: Negative adrenals. Two left renal calculi measuring up to 9 mm in the interpolar region. The second is punctate size in the lower pole. Punctate right renal calculus. No hydronephrosis or ureteral stone. Left renal cyst as characterized previously. Unremarkable bladder. Stomach/Bowel: No obstruction. No appendicitis. Moderate colonic diverticulosis. Status post sigmoidectomy. Vascular/Lymphatic: No acute vascular abnormality. No mass or adenopathy. Reproductive:Hysterectomy. Newly seen 36 mm cyst from the left ovary, with probable internal septation. Other: No ascites or pneumoperitoneum. Musculoskeletal: No acute abnormalities. Advanced facet arthropathy. L4-5 leftward translation and mild scoliosis. IMPRESSION: 1. No acute finding.  No hydronephrosis or ureteral stone. 2. 36 mm left ovarian cyst that is new from June 2017 and likely contains a septation. Recommend nonemergent pelvic ultrasound. 3. Bilateral nephrolithiasis with  stone measuring up to 9 mm on the left. 4. Colonic diverticulosis.  Status post sigmoidectomy. Electronically Signed   By: Monte Fantasia M.D.   On: 02/08/2017 14:31    Procedures Procedures (including critical care time)  Medications Ordered in ED Medications - No data to display   Initial Impression / Assessment and Plan / ED Course  I have reviewed the triage vital signs and the nursing notes.  Pertinent labs & imaging results that were available during my care of the patient were reviewed by me and considered in my medical decision making (see chart for details).     Pt w left-sided abd/flank pain, hematuria, and atypical chest pain. Patient is nontoxic, nonseptic appearing, in no apparent distress. CT renal stone study ruled out ureteral stone and diverticulitis. CT showed benign ovarian cyst and recommends non-emergent U/S. Suspect etiology left-sided pain could be musculoskeletal, as she has pain with repositioning. CXR, EKG and trop negative  for acute pathology; chest pain unlikely of cardiac etiology. Patient's pain and other symptoms adequately managed in emergency department. Labs unremarkable. Patient does not meet the SIRS or Sepsis criteria.  On repeat exam patient does not have a surgical abdomen and there are no peritoneal signs.  No indication of appendicitis, bowel obstruction, bowel perforation, cholecystitis, diverticulitis. Patient discharged home with symptomatic treatment. Referral give for OB/Gyn follow up. Pt to follow up with her urologist, [facility], for hematuria and CT findings. Pt safe for discharge.  Patient discussed with and seen by Dr. Tamera Punt. Discussed results, findings, treatment and follow up. Patient advised of strict return precautions. Patient verbalized understanding and agreed with plan.    Final Clinical Impressions(s) / ED Diagnoses   Final diagnoses:  Left nephrolithiasis  Left ovarian cyst  Hematuria, microscopic  Left sided  abdominal pain  Atypical chest pain    New Prescriptions Discharge Medication List as of 02/08/2017  3:34 PM       Russo, [place] N, PA-C 02/08/17 1609    Malvin Johns, MD 02/09/17 [phone]

## 2017-02-08 NOTE — Discharge Instructions (Signed)
Please read instructions below. Schedule an appointment with your Urologist to follow up on your kidney stone and blood in your urine. Schedule an appointment with OB/GYN for a follow up ultrasound of your ovarian cyst. Return to the ER for worsening pain, inability to urinate, fever, nausea/vomiting, or new or concerning symptoms.

## 2017-02-08 NOTE — Telephone Encounter (Signed)
Patient Name: Summer [name]  DOB: March 01, 1951    Initial Comment Caller states she's having severe left side pain, and chest pains.   Nurse Assessment  Nurse: Verlin Fester RN, [facility]ney Date/Time (Eastern Time): 02/08/2017 10:01:44 AM  Confirm and document reason for call. If symptomatic, describe symptoms. ---Patient states she is having severe left side pain, and chest pains.  Does the patient have any new or worsening symptoms? ---Yes  Will a triage be completed? ---Yes  Related visit to physician within the last 2 weeks? ---No  Does the PT have any chronic conditions? (i.e. diabetes, asthma, etc.) ---No  Is this a behavioral health or substance abuse call? ---No     Guidelines    Guideline Title Affirmed Question Affirmed Notes  Chest Pain [1] Chest pain lasts > 5 minutes AND [2] described as crushing, pressure-like, or heavy    Final Disposition User   Call EMS [address] [place], RN, [facility] [place]    Referrals  [facility] - ED   Disagree/Comply: Disagree  Disagree/Comply Reason: Disagree with instructions

## 2017-04-02 ENCOUNTER — Encounter: Payer: Self-pay | Admitting: Neurology

## 2017-04-02 ENCOUNTER — Ambulatory Visit (INDEPENDENT_AMBULATORY_CARE_PROVIDER_SITE_OTHER): Payer: Federal, State, Local not specified - PPO | Admitting: Neurology

## 2017-04-02 VITALS — BP 130/71 | HR 75 | Ht 66.0 in | Wt 231.0 lb

## 2017-04-02 DIAGNOSIS — G500 Trigeminal neuralgia: Secondary | ICD-10-CM

## 2017-04-02 MED ORDER — OXCARBAZEPINE ER 150 MG PO TB24: 150.0000 mg | ORAL_TABLET | Freq: Every day | ORAL | 11 refills | Status: DC

## 2017-04-02 NOTE — Progress Notes (Addendum)
[facility]  PATIENT: Summer [name] DOB: August 14, 1951   REASON FOR VISIT: Follow-up for mild cognitive impairment, history of trigeminal neuralgia, new complaint of tremor, balance issues and temporal headache HISTORY FROM: Patient    HISTORY OF PRESENT ILLNESS:HISTORYMs. [name] is a 66 years old right-handed African American female, referred by her primary care physician Dr. Stevie Kern for evaluation of right facial pain  She was diagnosed with right trigeminal neuralgia in 2006, presenting with severe electronics shooting pain at her right upper molar, she had microvascular decompression surgery at [facility] by Dr. Harrison Mons around 2006, which has helped the severe pain, but she continued to have annoying almost constant pain at her right face, she describes hot cold sensitivity over right face, touching of right forehead region will also set of her right facial pain, she felt right tongue swelling, she bites on her right tongue sometimes, she works at customer service, on the phone many hours each day, with prolonged talking, she felt sandpaper was rubbing against her right tongue, also complains of right upper and lower lip numbness, intermittent electronics shooting pain,  Over the years, she has tried different medication, was evaluated by our office Dr. Brett Fairy in 2008, she has tried gabapentin, Lyrica, Cymbalta, Trileptal, currently is taking Tegretol 200 mg twice a day, initially it helps her some, but failed to help work consistently, She denied visual change, recent few months of left lower back, left hip pain, radiating to left posterior thigh, to her left leg and left foot, difficulty walking after prolonged sitting She complains of left low back pain, left hip pain, radiating pain to left foot, like walking on a dead, burning sensation, she continued to complains of right facial constant achy pain, there was no significant change after started on  Oxtellar xR 300 mg every day, no significant side effects,  I have reviewed MRI, mild small vessel disease, no significant abnormality otherwise,  She had MRI lumbar spine (without) demonstrating: L5-S1: facet hypertrophy with moderate right and severe left foraminal stenosis  L4-5: pseudo disc bulging and facet hypertrophy with moderate right and mild left foraminal stenosis. There is 2 mm anterior spondylolisthesis of L4 on L5.  Degenerative spondylosis and disc bulging L1-2 and L4-5.  UPDATE Sep 30 2014: She felt sand paper sensation in her right tongue, flashing sensation, last 1-2 weeks, only take gabapentin 391m tid now. She also complains of snoring, frequent wakening during the nighttime, excessive sleepiness at daytime, F ESS score is 37, ESS is 9  UPDATE 10/06/2015 Summer [name] 66year old female returns for follow-up. She has a history of right trigeminal neuralgia is currently on Neurontin 300 mg 3 times a day has breakthrough pain. 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 She occasionally will have upper and lower lip numbness. 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,  Update March 01 2016: She presented to the emergency room February 21 2016 for nausea, unexpected fall, night before that because severe facial pain, she took extra dose of lamotrigine, made it to 250 mg that particular day,  laboratory evaluation showed mildly low potassium of CMP WAS GIVEN SUPPLEMENT, NORMAL CBC, lamotrigine level in June 15 was 15.6,  For a while, she had severe right facial pain, was taking  gabapentin 300 mg 2 tablets every 2 hours, 3 tablets at night, about 11 tablets each day 3300 mg every day plus lamotrigine 100 mg twice a day,   Lamotrigine 100 mg twice a day has been very helpful, it has taking care of her long bothered right tongue sandpaper sensation. She is now taking gabapentin 300 mg 3/2/3 tablets each day, she has been off lamotrigine since February 26 2016, the sandpaper sensation on the right side of the tongue came back, she is now taking Cymbalta 30 mg 3 times a day  She has been complains 3 weeks history of urinary urgency, difficulty initiating urine, UA showed mild UTI, was treated with Cipro without improving her symptoms  UPDATE May 10 2016: She was evaluated by Dr. Salomon Fick in July 2017, is planning on to have gamma knife in early September, she complains of mild memory loss, difficulty handling her computer tasks sometimes, there was no family history of memory loss, she also complains of episode, right after she get out of the bed,  She is on polypharmacy, now taking lamotrigine 100 mg twice a day, gabapentin 300 mg 4 in the morning, 2 at lunch time, 4 tablets at nighttime. She also has obesity, snoring, excessive daytime fatigue and sleepiness  We have personally reviewed CT head without contrast in June 2017, no acute abnormality, MRI of the cervical in 2008, multilevel degenerative changes, most severe at C5-6, C6-7, with herniated disc, mild canal stenosis, mild cord signal changes.  Laboratory evaluation in 2017, elevated WBC 11 point 8, hemoglobin of 13, no significant abnormality on BMP  UPDATE Apr 02 2017: We have personally reviewed MRI of the brain in April 2018 generalized atrophy mild supratentorium small vessel disease MRI of cervical spine April, multilevel degenerative changes most severe at C6-7, mild canal stenosissignificant foraminal narrowing or signal changes,  She had a, knife by [facility] Dr. Angelene Giovanni in September 2017 she denies  significant improvement, now cold air still trigger her right facial pain, bleeding from right temporal region along the right face, right jaw, electronic shooting pain, to her right lower molar region, she has intermittent left hand tremor, driving,   She is now taking Lamictal 100 mg 3 tablets each day, gabapentin 600 mg 5 tablets each day, despite combination therapy, she remains symptomatic, she is able to go to work.  She is on the phone 10 hours a day, mild memory loss.  I reviewed laboratory evaluation in 2018, negative troponin, CBC with hemoglobin of 11.9, CMP, creatinine of 0.84, ESR, mild elevated C reactive protein 7.8, lamotrigine 12.9, TSH 1.2 7,   REVIEW OF SYSTEMS: Full 14 system review of systems performed and notable only for those listed, all others are neg:    ALLERGIES: Allergies  Allergen Reactions  . Clindamycin Hcl Itching    itch    HOME MEDICATIONS: Outpatient Medications Prior to Visit  Medication Sig Dispense Refill  . acetaminophen (TYLENOL) 500 MG tablet Take 1,000 mg by mouth every 6 (six) hours as needed.    . Aspirin-Acetaminophen-Caffeine (GOODY HEADACHE PO) Take 1 packet by mouth daily as needed (headache).    . ciclopirox (LOPROX) 0.77 % cream     . gabapentin (NEURONTIN) 600 MG tablet Take two capsules in am, one capsule in afternoon, two capsules in evening.  150 tablet 11  . hydrocortisone 2.5 % cream     . ketoconazole (NIZORAL) 2 % shampoo     . lamoTRIgine (LAMICTAL) 100 MG tablet TAKE 1 TABLET BY MOUTH EVERY MORNING AND TAKE 2 TABLETS BY MOUTH AT NIGHT (Patient taking differently: TAKE 1 TABLET BY MOUTH EVERY MORNING AND TAKE 1 TABLET BY MOUTH AT NIGHT) 270 tablet 3   No facility-administered medications prior to visit.     PAST MEDICAL HISTORY: Past Medical History:  Diagnosis Date  . Arthritis shoulders and back  . 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 Diverticulosis   . Fibromyalgia    . History of kidney stones   . History of kidney stones   . History of shingles 2012--  no residual pain  . Left ureteral calculus   . MCI (mild cognitive impairment)   . Trigeminal neuralgia RIGHT  . Urgency of urination     PAST SURGICAL HISTORY: Past Surgical History:  Procedure Laterality Date  . CEREBRAL MICROVASCULAR DECOMPRESSION  07-05-2006   RIGHT TRIGEMINAL NERVE  . CYSTOSCOPY/RETROGRADE/URETEROSCOPY/STONE EXTRACTION WITH BASKET  07/04/2012   Procedure: CYSTOSCOPY/RETROGRADE/URETEROSCOPY/STONE EXTRACTION WITH BASKET;  Surgeon: Claybon Jabs, MD;  Location: [facility];  Service: Urology;  Laterality: Left;  . EXPLORATORY LAPAROTOMY/ RESECTION MID TO DISTAL SIGMOID AND PROXIMAL RECTUM/ END PROXIMAL SIGMOID COLOSTOMY  07-17-2006   PERFORATED DIVERTICULITIS WITH PERITONITIS  . gamma knife  05/17/2016   for trigeminal neuralgia, [facility], Dr Salomon Fick  . RESECTION COLOSTOMY/ CLOSURE COLOSTOMY WITH COLOPROCTOSTOMY  02-20-2007  . TONSILLECTOMY  AS CHILD  . URETEROSCOPY  07/04/2012   Procedure: URETEROSCOPY;  Surgeon: Claybon Jabs, MD;  Location: [facility];  Service: Urology;  Laterality: Left;  Marland Kitchen VAGINAL HYSTERECTOMY  1998   Partial  . WISDOM TOOTH EXTRACTION      FAMILY HISTORY: Family History  Problem Relation Age of Onset  . Heart disease Maternal Grandmother   . Diabetes Mother   . Breast cancer Maternal Aunt   . Lupus Cousin   . Colon cancer Neg Hx   . Esophageal cancer Neg Hx   . Rectal cancer Neg Hx   . Stomach cancer Neg Hx     SOCIAL HISTORY: Social History   Social History  . Marital status: Married    Spouse name: N/A  . Number of children: 1  . Years of education: College   Occupational History  .  Unemployed  . HUMAN RESOURCES  [employer]   Social History Main Topics  . Smoking status: Never Smoker  . Smokeless tobacco: Never Used  . Alcohol use Yes     Comment: RARE  . Drug use: No  . Sexual  activity: Not on file   Other Topics Concern  . Not on file   Social History Narrative  . No narrative on file     PHYSICAL EXAM  Vitals:   04/02/17 1040  BP: 130/71  Pulse: 75  Weight: 231 lb (104.8 kg)  Height: 5' 6"  (1.676 m)   Body mass index is 37.28 kg/m.  Generalized: Well developed,Obese female in no acute distress , well-groomed Head: normocephalic and atraumatic,. Oropharynx benign  Neck: Supple, no carotid bruits  Cardiac: Regular rate rhythm, no murmur  Musculoskeletal: No deformity   Neurological examination   Mentation: Alert AFT 4. Clock drawing 2/4. MMSE - Mini Mental State Exam 04/02/2017 12/21/2016 05/10/2016  Orientation to time 4 4  5  Orientation to Place 5 5 5   Registration 3 3 3   Attention/ Calculation 5 5 5   Recall 2 2 1   Language- name 2 objects 2 2 2   Language- repeat 1 1 1   Language- follow 3 step command 3 3 3   Language- read & follow direction 1 1 1   Write a sentence 1 1 1   Copy design 1 0 1  Total score 28 27 28     Follows all commands speech and language fluent.   Cranial nerve II-XII: Fundoscopic exam reveals sharp disc margins.Pupils were equal round reactive to light extraocular movements were full, visual field were full on confrontational test. Facial sensation and strength were normal. hearing was intact to finger rubbing bilaterally. Uvula tongue midline. head turning and shoulder shrug were normal and symmetric.Tongue protrusion into cheek strength was normal. Motor: normal bulk and tone, full strength in the BUE, BLE, fine finger movements normal, no pronator drift. No focal weakness Sensory: normal and symmetric to light touch, pinprick, and  Vibration, in the upper and lower extremities Coordination: finger-nose-finger, heel-to-shin bilaterally, no dysmetria, no resting tremor Reflexes: Brachioradialis 2/2, biceps 2/2, triceps 2/2, patellar 2/2, Achilles 2/2, plantar responses were flexor bilaterally. Gait and Station: Rising up  from seated position without assistance, wide based  stance,  moderate stride, good arm swing, smooth turning, able to perform tiptoe, and heel walking without difficulty. Tandem gait is un steady  DIAGNOSTIC DATA (LABS, IMAGING, TESTING) - I reviewed patient records, labs, notes, testing and imaging myself where available.  Lab Results  Component Value Date   WBC 7.6 02/08/2017   HGB 11.9 (L) 02/08/2017   HCT 36.9 02/08/2017   MCV 86.8 02/08/2017   PLT 345 02/08/2017      Component Value Date/Time   NA 142 02/08/2017 1415   K 3.9 02/08/2017 1415   CL 108 02/08/2017 1415   CO2 29 02/08/2017 1415   GLUCOSE 86 02/08/2017 1415   GLUCOSE 96 08/20/2006 1433   BUN 14 02/08/2017 1415   CREATININE 0.84 02/08/2017 1415   CALCIUM 8.9 02/08/2017 1415   PROT 7.0 02/08/2017 1415   ALBUMIN 3.6 02/08/2017 1415   AST 17 02/08/2017 1415   ALT 11 (L) 02/08/2017 1415   ALKPHOS 80 02/08/2017 1415   BILITOT 0.7 02/08/2017 1415   GFRNONAA >60 02/08/2017 1415   GFRAA >60 02/08/2017 1415   Lab Results  Component Value Date   CHOL 210 (H) 10/27/2015   HDL 65.80 10/27/2015   LDLCALC 133 (H) 10/27/2015   LDLDIRECT 141.1 10/01/2013   TRIG 58.0 10/27/2015   CHOLHDL 3 10/27/2015   Lab Results  Component Value Date   HGBA1C 6.1 10/27/2015   Lab Results  Component Value Date   VITAMINB12 422 05/10/2016   Lab Results  Component Value Date   TSH 1.27 10/26/2016      ASSESSMENT AND PLAN  66 y.o. year old female   Right trigeminal neuralgia  Continue lamotrigine 100 mg twice a day  Gabapentin 600 mg 5 tablets daily  Add on oxitellar xr 177m qhs  Lab evaluation today, check level of lamotrigine and gabapentin  If she responded to Trileptal, may continue titrating up Trileptal dosage, consider lower dose of gabapentin at follow-up visit  Memory loss:  This could due to polypharmacy treatment, reviewed laboratory evaluations, no treatable etiology found,   MRI of the brain showed  generalized atrophy small vessel disease.    YMarcial Pacas M.D. Ph.D.  [facility] [id number]  [place], [place] [address] Phone: [phone] Fax:      [phone]

## 2017-04-03 LAB — LAMOTRIGINE LEVEL: Lamotrigine Lvl: 11.3 ug/mL (ref 2.0–20.0)

## 2017-04-03 LAB — GABAPENTIN LEVEL: Gabapentin Lvl: 11.1 ug/mL (ref 4.0–16.0)

## 2017-04-24 ENCOUNTER — Encounter: Payer: Self-pay | Admitting: Neurology

## 2017-05-16 ENCOUNTER — Encounter: Payer: Self-pay | Admitting: Nurse Practitioner

## 2017-05-17 ENCOUNTER — Telehealth: Payer: Self-pay | Admitting: Neurology

## 2017-05-17 NOTE — Telephone Encounter (Signed)
Please call concerning her symptoms and medications. I left message at her home and cellphone

## 2017-05-20 ENCOUNTER — Encounter: Payer: Self-pay | Admitting: Nurse Practitioner

## 2017-05-20 ENCOUNTER — Encounter: Payer: Self-pay | Admitting: *Deleted

## 2017-05-20 NOTE — Telephone Encounter (Addendum)
Left a third message for patient to return my call.

## 2017-05-20 NOTE — Telephone Encounter (Signed)
Left messages at both numbers requesting a return call.

## 2017-05-20 NOTE — Telephone Encounter (Signed)
Mailed unable to contact letter.

## 2017-05-20 NOTE — Telephone Encounter (Signed)
Left another message for a return call

## 2017-05-21 ENCOUNTER — Encounter: Payer: Self-pay | Admitting: *Deleted

## 2017-05-26 ENCOUNTER — Ambulatory Visit (HOSPITAL_COMMUNITY)
Admission: EM | Admit: 2017-05-26 | Discharge: 2017-05-26 | Disposition: A | Discharge status: Home / Self Care | Payer: Federal, State, Local not specified - PPO | Attending: Family Medicine | Admitting: Family Medicine

## 2017-05-26 ENCOUNTER — Encounter (HOSPITAL_COMMUNITY): Payer: Self-pay | Admitting: Emergency Medicine

## 2017-05-26 DIAGNOSIS — L299 Pruritus, unspecified: Secondary | ICD-10-CM | POA: Diagnosis not present

## 2017-05-26 DIAGNOSIS — M25532 Pain in left wrist: Secondary | ICD-10-CM | POA: Diagnosis not present

## 2017-05-26 DIAGNOSIS — W57XXXA Bitten or stung by nonvenomous insect and other nonvenomous arthropods, initial encounter: Secondary | ICD-10-CM

## 2017-05-26 NOTE — ED Triage Notes (Signed)
Pt reports possible insect bite to right wrist onset 2 days   Sx today include swelling and pain  Denies fevers chills  A&O x4... NAD... Ambulatory

## 2017-05-26 NOTE — Discharge Instructions (Signed)
I think you are right and have an insect bite. It is inflamed but NOT infected per your exam. Would treat with Benadryl 25mg  every 4-6 hours for reaction to this. Certainly if you are having worsening pain or redness then please f/u. I see no signs of infection at this time. Benadryl cream may be helpful too for local itching. FU as needed.

## 2017-05-26 NOTE — ED Provider Notes (Signed)
[place]    CSN: [id number] Arrival date & time: 05/26/17  1945     History   Chief Complaint Chief Complaint  Patient presents with  . Insect Bite    HPI Summer [name] is a 66 y.o. female.   66 yo presents with mild pain and pruritis of her left wrist. She suspects she was bitten a few days ago but cannot be sure. She developed a "welp" along the wrist with swelling and itching to follow. She notes some pain today. No fever or chills.       Past Medical History:  Diagnosis Date  . Arthritis shoulders and back  . 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 Diverticulosis   . Fibromyalgia   . History of kidney stones   . History of kidney stones   . History of shingles 2012--  no residual pain  . Left ureteral calculus   . MCI (mild cognitive impairment)   . Trigeminal neuralgia RIGHT  . Urgency of urination     Patient Active Problem List   Diagnosis Date Noted  . Memory loss 12/21/2016  . Tremor 12/21/2016  . Right sided temporal headache 12/21/2016  . Trochanteric bursitis, right hip 10/26/2016  . Facial rash 10/26/2016  . Foot sprain, left, initial encounter 08/31/2016  . Pain of right side of body 08/31/2016  . Headache 04/24/2016  . LLQ pain 04/24/2016  . Glossitis 02/02/2016  . Routine general medical examination at a health care facility 10/27/2015  . Facial pain 10/06/2015  . Trigeminal neuralgia of right side of face 11/30/2010  . Morbid obesity ([place]) 11/09/2010  . Fibromyalgia 11/09/2010  . HEMATURIA, HX OF 11/14/2009  . LOC OSTEOARTHROS NOT SPEC PRIM/SEC OTH [facility] SITE 06/27/2009  . Allergic rhinitis 08/26/2008  . 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 CEREBRAL MICROVASCULAR DECOMPRESSION  07-05-2006   RIGHT TRIGEMINAL NERVE  . CYSTOSCOPY/RETROGRADE/URETEROSCOPY/STONE EXTRACTION WITH BASKET  07/04/2012   Procedure: CYSTOSCOPY/RETROGRADE/URETEROSCOPY/STONE EXTRACTION WITH BASKET;  Surgeon: Claybon Jabs, MD;  Location: [facility];  Service: Urology;  Laterality: Left;  . EXPLORATORY LAPAROTOMY/ RESECTION MID TO DISTAL SIGMOID AND PROXIMAL RECTUM/ END PROXIMAL SIGMOID COLOSTOMY  07-17-2006   PERFORATED DIVERTICULITIS WITH PERITONITIS  . gamma knife  05/17/2016   for trigeminal neuralgia, [facility], Dr Salomon Fick  . RESECTION COLOSTOMY/ CLOSURE COLOSTOMY WITH COLOPROCTOSTOMY  02-20-2007  . TONSILLECTOMY  AS CHILD  . URETEROSCOPY  07/04/2012   Procedure: URETEROSCOPY;  Surgeon: Claybon Jabs, MD;  Location: [facility];  Service: Urology;  Laterality: Left;  Marland Kitchen VAGINAL HYSTERECTOMY  1998   Partial  . WISDOM TOOTH EXTRACTION      OB History    No data available       Home Medications    Prior to Admission medications   Medication Sig Start Date End Date Taking? Authorizing Provider  gabapentin (NEURONTIN) 600 MG tablet Take two capsules in am, one capsule in afternoon, two capsules in evening. 10/31/16  Yes Marcial Pacas, MD  lamoTRIgine (LAMICTAL) 100 MG tablet TAKE 1 TABLET BY MOUTH EVERY MORNING AND TAKE 2 TABLETS BY MOUTH AT NIGHT Patient taking differently: TAKE 1 TABLET BY MOUTH EVERY MORNING AND TAKE 1 TABLET BY MOUTH AT NIGHT 01/08/17  Yes Marcial Pacas, MD  acetaminophen (TYLENOL) 500 MG tablet Take 1,000 mg by mouth every 6 (six) hours as needed.  [provider]  Aspirin-Acetaminophen-Caffeine (GOODY HEADACHE PO) Take 1 packet by mouth daily as needed (headache).    [provider]  ciclopirox (LOPROX) 0.77 % cream  12/03/16   [provider]  hydrocortisone 2.5 % cream  11/30/16   [provider]  ketoconazole (NIZORAL) 2 % shampoo  11/30/16   [provider]  OXcarbazepine ER 150 MG TB24 Take 150 mg by mouth at bedtime. 04/02/17   Marcial Pacas, MD    Family History Family History  Problem Relation Age of Onset    . Heart disease Maternal Grandmother   . Diabetes Mother   . Breast cancer Maternal Aunt   . Lupus Cousin   . Colon cancer Neg Hx   . Esophageal cancer Neg Hx   . Rectal cancer Neg Hx   . Stomach cancer Neg Hx     Social History Social History  Substance Use Topics  . Smoking status: Never Smoker  . Smokeless tobacco: Never Used  . Alcohol use Yes     Comment: RARE     Allergies   Clindamycin hcl   Review of Systems Review of Systems  All other systems reviewed and are negative.    Physical Exam Triage Vital Signs ED Triage Vitals [05/26/17 2000]  Enc Vitals Group     BP (!) 143/59     Pulse Rate 86     Resp 20     Temp 99.1 F (37.3 C)     Temp Source Oral     SpO2 99 %     Weight      Height      Head Circumference      Peak Flow      Pain Score 9     Pain Loc      Pain Edu?      Excl. in [place]?    No data found.   Updated Vital Signs BP (!) 143/59 (BP Location: Left Arm)   Pulse 86   Temp 99.1 F (37.3 C) (Oral)   Resp 20   SpO2 99%   Visual Acuity Right Eye Distance:   Left Eye Distance:   Bilateral Distance:    Right Eye Near:   Left Eye Near:    Bilateral Near:     Physical Exam  Constitutional: She is oriented to person, place, and time. She appears well-developed and well-nourished.  Neurological: She is alert and oriented to person, place, and time.  Skin: Skin is warm and dry.  Mild swelling along the right ulna without warmth or streaking. Full ROM and no pain with palpation. No evidence of infection  Psychiatric: Her behavior is normal.  Nursing note and vitals reviewed.    UC Treatments / Results  Labs (all labs ordered are listed, but only abnormal results are displayed) Labs Reviewed - No data to display  EKG  EKG Interpretation None       Radiology No results found.  Procedures Procedures (including critical care time)  Medications Ordered in UC Medications - No data to display   Initial Impression /  Assessment and Plan / UC Course  I have reviewed the triage vital signs and the nursing notes.  Pertinent labs & imaging results that were available during my care of the patient were reviewed by me and considered in my medical decision making (see chart for details).     Probable insect bite with local inflammation. No signs of infection. Suggest use of Benadryl 25 mg every  4-5 hours with caution to somnolence. May use local Benadryl cream. If worrisome for signs of infection or not improving then please f/u.   Final Clinical Impressions(s) / UC Diagnoses   Final diagnoses:  Insect bite, initial encounter    New Prescriptions New Prescriptions   No medications on file     Controlled Substance Prescriptions [place] Controlled Substance Registry consulted? Not Applicable   Prudencio Pair 05/26/17 2018

## 2017-06-11 ENCOUNTER — Encounter: Payer: Self-pay | Admitting: Nurse Practitioner

## 2017-06-13 ENCOUNTER — Other Ambulatory Visit: Payer: Self-pay | Admitting: Neurology

## 2017-06-13 ENCOUNTER — Telehealth: Payer: Self-pay | Admitting: Nurse Practitioner

## 2017-06-13 MED ORDER — OXCARBAZEPINE ER 300 MG PO TB24: 300.0000 mg | ORAL_TABLET | Freq: Every day | ORAL | 11 refills | Status: DC

## 2017-06-13 NOTE — Telephone Encounter (Signed)
Pt called regarding the email she sent on 10/2. She can be reached on cell (1st) and home. Please call

## 2017-06-13 NOTE — Progress Notes (Signed)
I have increased her oxtellar xr from 150mg  every night to 300mg  every night

## 2017-06-20 ENCOUNTER — Telehealth: Payer: Self-pay | Admitting: Neurology

## 2017-06-20 NOTE — Telephone Encounter (Signed)
Dr. Krista Blue asked me to call this pt to discuss. No answer, left a message asking her to call me back.  Dr. Krista Blue recommends that pt stop taking one of the three meds for trigeminal neuralgia if she is concerned about side effects. The gabapentin is already a high dose and she does not want to increase this. Pt may want to stop the lamotrigine and Dr. Krista Blue may consider increasing the oxtellar. I really think this pt should wait until her appt on 07/04/17 with Hoyle Sauer, NP or be seen sooner to discuss her medication concerns.  No answer, left a message asking her to call me back.

## 2017-06-20 NOTE — Telephone Encounter (Signed)
From: [name]  Sent: 06/20/2017 12:23 PM  To: [facility] Clinical Pool  Subject: RE: Non-Urgent Medical Question           Thanks for responding to me. My only concern is I am taking too much medication such as the l Oxtellar XR to 300mg  with the Lamotrigine 100mg  tab, gabapentin 600mg . Mainly, my concern is the lamotrigine vs. oxtellar xr. What are the side effects from taking these. I understand that they are both for just about the same condition. So you feel that taking both of these rather increasing the gabapentin will work best? I'm just wondering if the side effects for the pain can be helped by combining these. Also, how often should I have blood work done?  ----- Message -----  From: Marcial Pacas, MD  Sent: 06/13/2017 3:30 PM EDT  To: [name]  Subject: RE: Non-Urgent Medical Question  Mrs. Free:    Based on last visit on April 02 2017, you should have prescription of Lamotrigine 100mg  tab, gabapentin 600mg , also Oxiellar Xr 150mg  tabs.    Polypharmacy will cause side effect of dizziness, slow thinking.    I can increase Oxtellar XR to 300mg  every night for you to have better 24 hour coverage and this may allow you to take less dose of Gabapentin 600mg  during the day.     I have called in Oxellar XR 300mg  every night to your pharmarcy.

## 2017-06-24 NOTE — Telephone Encounter (Signed)
I called pt to discuss. No answer, left a message asking her to call me back. 

## 2017-06-25 ENCOUNTER — Telehealth: Payer: Self-pay | Admitting: *Deleted

## 2017-06-25 NOTE — Telephone Encounter (Signed)
I called pt again, no answer, left a message asking her to call me back. This is my third attempt at calling this pt. I will send her a mychart message.

## 2017-06-25 NOTE — Progress Notes (Signed)
[facility]  PATIENT: Summer [name] DOB: January 07, 1951   REASON FOR VISIT : follow-up for worsening right facial pain HISTORY FROM: patient    HISTORY OF PRESENT ILLNESS:Summer [name] is a 66 years old right-handed African American female, referred by her primary care physician Dr. Stevie Kern for evaluation of right facial pain  She was diagnosed with right trigeminal neuralgia in 2006, presenting with severe electronics shooting pain at her right upper molar, she had microvascular decompression surgery at [facility] by Dr. Harrison Mons around 2006, which has helped the severe pain, but she continued to have annoying almost constant pain at her right face, she describes hot cold sensitivity over right face, touching of right forehead region will also set of her right facial pain, she felt right tongue swelling, she bites on her right tongue sometimes, she works at customer service, on the phone many hours each day, with prolonged talking, she felt sandpaper was rubbing against her right tongue, also complains of right upper and lower lip numbness, intermittent electronics shooting pain,  Over the years, she has tried different medication, was evaluated by our office Dr. Brett Fairy in 2008, she has tried gabapentin, Lyrica, Cymbalta, Trileptal, currently is taking Tegretol 200 mg twice a day, initially it helps her some, but failed to help work consistently, She denied visual change, recent few months of left lower back, left hip pain, radiating to left posterior thigh, to her left leg and left foot, difficulty walking after prolonged sitting She complains of left low back pain, left hip pain, radiating pain to left foot, like walking on a dead, burning sensation, she continued to complains of right facial constant achy pain, there was no significant change after started on Oxtellar xR 300 mg every day, no significant side effects,  I have reviewed MRI, mild small vessel  disease, no significant abnormality otherwise,  She had MRI lumbar spine (without) demonstrating: L5-S1: facet hypertrophy with moderate right and severe left foraminal stenosis  L4-5: pseudo disc bulging and facet hypertrophy with moderate right and mild left foraminal stenosis. There is 2 mm anterior spondylolisthesis of L4 on L5.  Degenerative spondylosis and disc bulging L1-2 and L4-5.  UPDATE Sep 30 2014:YY She felt sand paper sensation in her right tongue, flashing sensation, last 1-2 weeks, only take gabapentin 3108m tid now. She also complains of snoring, frequent wakening during the nighttime, excessive sleepiness at daytime, F ESS score is 37, ESS is 9  UPDATE 01/26/2017CMMs. LChastang 66year old female returns for follow-up. She has a history of right trigeminal neuralgia is currently on Neurontin 300 mg 3 times a day has breakthrough pain. 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 She occasionally will have upper and lower lip numbness. 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,  Update March 01 2016:YY She presented to the emergency room February 21 2016 for nausea, unexpected fall, night before that because severe facial pain, she took extra dose of lamotrigine, made it to 250 mg that particular day, laboratory evaluation showed mildly low potassium of CMP WAS GIVEN SUPPLEMENT,  NORMAL CBC, lamotrigine level in June 15 was 15.6,  For a while, she had severe right facial pain, was taking gabapentin 300 mg 2 tablets every 2 hours, 3 tablets at night, about 11 tablets each day 3300 mg  every day plus lamotrigine 100 mg twice a day,   Lamotrigine 100 mg twice a day has been very helpful, it has taking care of her long bothered right tongue sandpaper sensation. She is now taking gabapentin 300 mg 3/2/3 tablets each day, she has been off lamotrigine since February 26 2016, the sandpaper sensation on the right side of the tongue came back, she is now taking Cymbalta 30 mg 3 times a day  She has been complains 3 weeks history of urinary urgency, difficulty initiating urine, UA showed mild UTI, was treated with Cipro without improving her symptoms  UPDATE May 10 2016:YY She was evaluated by Dr. Salomon Fick in July 2017, is planning on to have gamma knife in early September, she complains of mild memory loss, difficulty handling her computer tasks sometimes, there was no family history of memory loss, she also complains of episode, right after she get out of the bed, She is on polypharmacy, now taking lamotrigine 100 mg twice a day, gabapentin 300 mg 4 in the morning, 2 at lunch time, 4 tablets at nighttime. She also has obesity, snoring, excessive daytime fatigue and sleepiness  We have personally reviewed CT head without contrast in June 2017, no acute abnormality, MRI of the cervical in 2008, multilevel degenerative changes, most severe at C5-6, C6-7, with herniated disc, mild canal stenosis, mild cord signal changes.  Laboratory evaluation in 2017, elevated WBC 11 point 8, hemoglobin of 13, no significant abnormality on BMP  UPDATE Apr 02 2017:YY We have personally reviewed MRI of the brain in April 2018 generalized atrophy mild supratentorium small vessel disease MRI of cervical spine April, multilevel degenerative changes most severe at C6-7, mild canal stenosissignificant foraminal narrowing or signal changes,  She had a, knife by [facility] Dr. Angelene Giovanni in September 2017 she denies significant improvement, now cold air still trigger her right facial pain, bleeding from  right temporal region along the right face, right jaw, electronic shooting pain, to her right lower molar region, she has intermittent left hand tremor, driving,  She is now taking Lamictal 100 mg 3 tablets each day, gabapentin 600 mg 5 tablets each day, despite combination therapy, she remains symptomatic, she is able to go to work.  She is on the phone 10 hours a day, mild memory loss. I reviewed laboratory evaluation in 2018, negative troponin, CBC with hemoglobin of 11.9, CMP, creatinine of 0.84, ESR, mild elevated C reactive protein 7.8, lamotrigine 12.9, TSH 1.2 7,  UPDATE 06/26/17 [name], 66 year old female returns for follow-up with history of trigeminal neuralgia. Her facial pain is not controlled. Patient continues to have right-sided facial pain and her tongue feels like sandpaper after being on her job for 10 hours a day. She has gargled with salt water. She works at a call center.  She is currently on Lamictal 100 twice daily, Oxtellar XR 300 mg daily, gabapentin 694m 2 tabs in the a.m., 3 tabs at lunch 1-2 at  Night. She denies side effects to the meds.She denies any falls. Tremor is stable She had Gamma knife by Dr. TSalomon Fickin September 2017. She has not followed up with him. He gabapentin level XI.2 on 04/02/2017. Lamictal level 11.3.These are therapeutic CBC and CMP in June within normal limits .  She returns for reevaluation REVIEW OF SYSTEMS: Full 14 system review of systems performed and notable only for those listed, all others are neg:  Constitutional: neg  Cardiovascular: neg Ear/Nose/Throat: neg  Skin: neg Eyes: neg Respiratory: neg Gastroitestinal: neg  Hematology/Lymphatic: neg  Endocrine: neg Musculoskeletal:neg Allergy/Immunology: neg Neurological: Continued right facial pain, not controlled with medications Psychiatric: neg Sleep : neg   ALLERGIES: Allergies  Allergen Reactions  . Clindamycin Hcl Itching    itch    HOME MEDICATIONS: Outpatient  Medications Prior to Visit  Medication Sig Dispense Refill  . acetaminophen (TYLENOL) 500 MG tablet Take 1,000 mg by mouth every 6 (six) hours as needed.    . Aspirin-Acetaminophen-Caffeine (GOODY HEADACHE PO) Take 1 packet by mouth daily as needed (headache).    . ciclopirox (LOPROX) 0.77 % cream     . gabapentin (NEURONTIN) 600 MG tablet Take two capsules in am, one capsule in afternoon, two capsules in evening. (Patient taking differently: Take two capsules in am, 3 at lunch,1-2 at night) 150 tablet 11  . hydrocortisone 2.5 % cream     . ketoconazole (NIZORAL) 2 % shampoo     . lamoTRIgine (LAMICTAL) 100 MG tablet TAKE 1 TABLET BY MOUTH EVERY MORNING AND TAKE 2 TABLETS BY MOUTH AT NIGHT (Patient taking differently: TAKE 1 TABLET BY MOUTH EVERY MORNING AND TAKE 1 TABLET BY MOUTH AT NIGHT) 270 tablet 3  . OXcarbazepine ER (OXTELLAR XR) 300 MG TB24 Take 300 mg by mouth at bedtime. (Patient taking differently: Take 600 mg by mouth at bedtime. ) 30 tablet 11   No facility-administered medications prior to visit.     PAST MEDICAL HISTORY: Past Medical History:  Diagnosis Date  . Arthritis shoulders and back  . 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 Diverticulosis   . Fibromyalgia   . History of kidney stones   . History of kidney stones   . History of shingles 2012--  no residual pain  . Left ureteral calculus   . MCI (mild cognitive impairment)   . Trigeminal neuralgia RIGHT  . Urgency of urination     PAST SURGICAL HISTORY: Past Surgical History:  Procedure Laterality Date  . CEREBRAL MICROVASCULAR DECOMPRESSION  07-05-2006   RIGHT TRIGEMINAL NERVE  . CYSTOSCOPY/RETROGRADE/URETEROSCOPY/STONE EXTRACTION WITH BASKET  07/04/2012   Procedure: CYSTOSCOPY/RETROGRADE/URETEROSCOPY/STONE EXTRACTION WITH BASKET;  Surgeon: Claybon Jabs, MD;  Location: [facility];  Service: Urology;  Laterality: Left;  . EXPLORATORY  LAPAROTOMY/ RESECTION MID TO DISTAL SIGMOID AND PROXIMAL RECTUM/ END PROXIMAL SIGMOID COLOSTOMY  07-17-2006   PERFORATED DIVERTICULITIS WITH PERITONITIS  . gamma knife  05/17/2016   for trigeminal neuralgia, [facility], Dr Salomon Fick  . RESECTION COLOSTOMY/ CLOSURE COLOSTOMY WITH COLOPROCTOSTOMY  02-20-2007  . TONSILLECTOMY  AS CHILD  . URETEROSCOPY  07/04/2012   Procedure: URETEROSCOPY;  Surgeon: Claybon Jabs, MD;  [facility];  Service: Urology;  Laterality: Left;  Marland Kitchen VAGINAL HYSTERECTOMY  1998   Partial  . WISDOM TOOTH EXTRACTION      FAMILY HISTORY: Family History  Problem Relation Age of Onset  . Heart disease Maternal Grandmother   . Diabetes Mother   . Breast cancer Maternal Aunt   . Lupus Cousin   . Colon cancer Neg Hx   . Esophageal cancer Neg Hx   . Rectal cancer Neg Hx   . Stomach cancer Neg Hx     SOCIAL HISTORY: Social History  Social History  . Marital status: Married    Spouse name: N/A  . Number of children: 1  . Years of education: College   Occupational History  .  Unemployed  . HUMAN RESOURCES  [employer]   Social History Main Topics  . Smoking status: Never Smoker  . Smokeless tobacco: Never Used  . Alcohol use Yes     Comment: RARE  . Drug use: No  . Sexual activity: Not on file   Other Topics Concern  . Not on file   Social History Narrative  . No narrative on file     PHYSICAL EXAM  Vitals:   06/26/17 0754  BP: 134/60  Pulse: 72  Resp: 16  Weight: 230 lb 9.6 oz (104.6 kg)  Height: 5' 6"  (1.676 m)   Body mass index is 37.22 kg/m.  Generalized: Well developed, Obese female in no acute distress  Head: normocephalic and atraumatic,. Oropharynx benign  Neck: Supple, no carotid bruits  Cardiac: Regular rate rhythm, no murmur  Musculoskeletal: No deformity   Neurological examination   Mentation: Alert oriented to time, place, history taking. Attention span and concentration appropriate. Recent and  remote memory intact.  Follows all commands speech and language fluent. MMSE not repeated last 28/30.   Cranial nerve II-XII: Pupils were equal round reactive to light extraocular movements were full, visual field were full on confrontational test. Facial sensation and strength were normal. hearing was intact to finger rubbing bilaterally. Uvula tongue midline. head turning and shoulder shrug were normal and symmetric.Tongue protrusion into cheek strength was normal. Motor: normal bulk and tone, full strength in the BUE, BLE, fine finger movements normal, no pronator drift. No tremor Sensory: normal and symmetric to light touch, pinprick, and  Vibration, in the upper and lower extremities Coordination: finger-nose-finger, heel-to-shin bilaterally, no dysmetria Reflexes: Brachioradialis 2/2, biceps 2/2, triceps 2/2, patellar 2/2, Achilles 2/2, plantar responses were flexor bilaterally. Gait and Station: Rising up from seated position without assistance, normal stance,  moderate stride, good arm swing, smooth turning, able to perform tiptoe, and heel walking without difficulty. Tandem gait is steady  DIAGNOSTIC DATA (LABS, IMAGING, TESTING) - I reviewed patient records, labs, notes, testing and imaging myself where available.  Lab Results  Component Value Date   WBC 7.6 02/08/2017   HGB 11.9 (L) 02/08/2017   HCT 36.9 02/08/2017   MCV 86.8 02/08/2017   PLT 345 02/08/2017      Component Value Date/Time   NA 142 02/08/2017 1415   K 3.9 02/08/2017 1415   CL 108 02/08/2017 1415   CO2 29 02/08/2017 1415   GLUCOSE 86 02/08/2017 1415   GLUCOSE 96 08/20/2006 1433   BUN 14 02/08/2017 1415   CREATININE 0.84 02/08/2017 1415   CALCIUM 8.9 02/08/2017 1415   PROT 7.0 02/08/2017 1415   ALBUMIN 3.6 02/08/2017 1415   AST 17 02/08/2017 1415   ALT 11 (L) 02/08/2017 1415   ALKPHOS 80 02/08/2017 1415   BILITOT 0.7 02/08/2017 1415   GFRNONAA >60 02/08/2017 1415   GFRAA >60 02/08/2017 1415   Lab  Results  Component Value Date   CHOL 210 (H) 10/27/2015   HDL 65.80 10/27/2015   LDLCALC 133 (H) 10/27/2015   LDLDIRECT 141.1 10/01/2013   TRIG 58.0 10/27/2015   CHOLHDL 3 10/27/2015   Lab Results  Component Value Date   HGBA1C 6.1 10/27/2015   Lab Results  Component Value Date   VITAMINB12 422 05/10/2016   Lab Results  Component  Value Date   TSH 1.27 10/26/2016      ASSESSMENT AND PLAN 66 y.o. year old female  has a past medical history of Arthritis (shoulders and back); Trigeminal neuralgia with  [facility]  Sept 2017, mild cognitive impairment, tremor  and worsening right-sided facial pain. Her mild cognitive impairment more likely due to polypharmacy treatment laboratory evaluation no treatable etiology found. MRI of the brain 01/04/17  small vessel disease  PLAN: Discussed with Dr. Krista Blue  Continue gabapentin 654m 2 tabs in the morning and 3 at lunch and 1to 2 at  night   Continue Lamictal  1043mtwice daily Increase Oxtellar to 2 tablets at bedtime or 60033mDr. YanKrista Blueggests  follow up with Dr. TatSalomon Fickollow-up 3-4 months I spent 25 in total face to face time with the patient more than 50% of which was spent counseling and coordination of care, reviewing test results reviewing medications and discussing and reviewing the diagnosis of trigeminal neuralgia and further treatment options. , NanRayburn MaC,[facility] Surgical CenterPRN  [facility] [id number]BottineaueWhite CityC [id number]

## 2017-06-25 NOTE — Telephone Encounter (Addendum)
Patient sent an email with several concerns.  I spoke to her on the phone.  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  She has tried gargling with warm salt water.  She feels her current medication regimen is not working.  She reports taking the following:   1) Lamictal 100mg , one tab BID 2) Oxtellar XR 300mg , one tablet daily 3) Gabapentin 600mg , two caps in am, 3 caps at lunch, 1-2 caps QHS  She had an appt pending w/ Hoyle Sauer on 07/04/17 but would like to come in earlier to discuss a therapy change.  She has been moved to an available opening on 06/26/17.

## 2017-06-26 ENCOUNTER — Ambulatory Visit (INDEPENDENT_AMBULATORY_CARE_PROVIDER_SITE_OTHER): Payer: Federal, State, Local not specified - PPO | Admitting: Nurse Practitioner

## 2017-06-26 ENCOUNTER — Encounter: Payer: Self-pay | Admitting: Nurse Practitioner

## 2017-06-26 VITALS — BP 134/60 | HR 72 | Resp 16 | Ht 66.0 in | Wt 230.6 lb

## 2017-06-26 DIAGNOSIS — G500 Trigeminal neuralgia: Secondary | ICD-10-CM

## 2017-06-26 DIAGNOSIS — R519 Headache, unspecified: Secondary | ICD-10-CM

## 2017-06-26 DIAGNOSIS — R51 Headache: Secondary | ICD-10-CM | POA: Diagnosis not present

## 2017-06-26 MED ORDER — GABAPENTIN 600 MG PO TABS: ORAL_TABLET | ORAL | 6 refills | Status: DC

## 2017-06-26 MED ORDER — OXCARBAZEPINE ER 300 MG PO TB24: 600.0000 mg | ORAL_TABLET | Freq: Every day | ORAL | 4 refills | Status: DC

## 2017-06-26 NOTE — Progress Notes (Signed)
I have reviewed and agreed above plan. 

## 2017-06-26 NOTE — Patient Instructions (Signed)
Continue gabapentin and Lamictal at current doses Increase Oxtellar to 2 tablets at bedtime Dr. Krista Blue suggests  follow up with Dr. Salomon Fick  Follow-up 3-4 months

## 2017-07-02 ENCOUNTER — Encounter: Payer: Self-pay | Admitting: Internal Medicine

## 2017-07-04 ENCOUNTER — Ambulatory Visit: Payer: Federal, State, Local not specified - PPO | Admitting: Nurse Practitioner

## 2017-07-06 NOTE — Progress Notes (Signed)
Office Visit Note  Patient: Summer [name]             Date of Birth: Nov 09, 1950           MRN: [id number]             PCP: Hoyt Koch, MD Referring: Hoyt Koch, * Visit Date: 07/08/2017 Occupation: @GUAROCC @    Subjective:  Follow-up (Total body pain, bil shoulder, bil hip pain, left worse than right, pain radiates from hip to knee)   History of Present Illness: Summer [name] is a 66 y.o. female with history of osteoarthritis, disc disease and fibromyalgia. She states she's been having a lot of pain and discomfort. She continues to have some discomfort in her bilateral shoulder joint. She's been having increased pain in her bilateral hip joints in her bilateral knee joints. She states her left hip and left knee joint has severe pain. She believes that her right knee may be swollen. She continues to have discomfort in her neck thoracic and lumbar spine.  Activities of Daily Living:  Patient reports morning stiffness for 5 minutes.   Patient Denies nocturnal pain.  Difficulty dressing/grooming: Denies Difficulty climbing stairs: Reports Difficulty getting out of chair: Reports Difficulty using hands for taps, buttons, cutlery, and/or writing: Denies   Review of Systems  Constitutional: Positive for fatigue. Negative for night sweats, weight gain and weakness.  HENT: Positive for mouth dryness. Negative for mouth sores, trouble swallowing, trouble swallowing and nose dryness.   Eyes: Negative for pain, redness, visual disturbance and dryness.  Respiratory: Negative for cough, shortness of breath and difficulty breathing.   Cardiovascular: Negative for chest pain, palpitations, hypertension, irregular heartbeat and swelling in legs/feet.  Gastrointestinal: Positive for constipation. Negative for blood in stool and diarrhea.  Endocrine: Negative for increased urination.  Genitourinary: Negative for vaginal dryness.  Musculoskeletal: Positive for  arthralgias, joint pain, joint swelling, myalgias, morning stiffness and myalgias. Negative for muscle weakness and muscle tenderness.  Skin: Negative for color change, rash, hair loss, skin tightness, ulcers and sensitivity to sunlight.  Allergic/Immunologic: Negative for susceptible to infections.  Neurological: Positive for memory loss. Negative for dizziness and night sweats.  Hematological: Negative for swollen glands.  Psychiatric/Behavioral: Negative for depressed mood and sleep disturbance. The patient is not nervous/anxious.     PMFS History:  Patient Active Problem List   Diagnosis Date Noted  . Memory loss 12/21/2016  . Tremor 12/21/2016  . Right sided temporal headache 12/21/2016  . Trochanteric bursitis, right hip 10/26/2016  . Facial rash 10/26/2016  . Foot sprain, left, initial encounter 08/31/2016  . Pain of right side of body 08/31/2016  . Headache 04/24/2016  . LLQ pain 04/24/2016  . Glossitis 02/02/2016  . Routine general medical examination at a health care facility 10/27/2015  . Facial pain 10/06/2015  . Trigeminal neuralgia of right side of face 11/30/2010  . Morbid obesity ([place]) 11/09/2010  . Fibromyalgia 11/09/2010  . HEMATURIA, HX OF 11/14/2009  . LOC OSTEOARTHROS NOT SPEC PRIM/SEC OTH [facility] SITE 06/27/2009  . Allergic rhinitis 08/26/2008  . 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 Arthritis shoulders and back  . 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 Diverticulosis   . Fibromyalgia   . History of kidney stones   . History of kidney stones   . History of shingles 2012--  no residual  pain  . Left ureteral calculus   . MCI (mild cognitive impairment)   . Trigeminal neuralgia RIGHT  . Urgency of urination     Family History  Problem Relation Age of Onset  . Heart disease Maternal Grandmother   . Diabetes Mother   . Breast cancer Maternal Aunt     . Lupus Cousin   . Colon cancer Neg Hx   . Esophageal cancer Neg Hx   . Rectal cancer Neg Hx   . Stomach cancer Neg Hx    Past Surgical History:  Procedure Laterality Date  . CEREBRAL MICROVASCULAR DECOMPRESSION  07-05-2006   RIGHT TRIGEMINAL NERVE  . CYSTOSCOPY/RETROGRADE/URETEROSCOPY/STONE EXTRACTION WITH BASKET  07/04/2012   Procedure: CYSTOSCOPY/RETROGRADE/URETEROSCOPY/STONE EXTRACTION WITH BASKET;  Surgeon: Claybon Jabs, MD;  Location: [facility];  Service: Urology;  Laterality: Left;  . EXPLORATORY LAPAROTOMY/ RESECTION MID TO DISTAL SIGMOID AND PROXIMAL RECTUM/ END PROXIMAL SIGMOID COLOSTOMY  07-17-2006   PERFORATED DIVERTICULITIS WITH PERITONITIS  . gamma knife  05/17/2016   for trigeminal neuralgia, [facility], Dr Salomon Fick  . RESECTION COLOSTOMY/ CLOSURE COLOSTOMY WITH COLOPROCTOSTOMY  02-20-2007  . TONSILLECTOMY  AS CHILD  . URETEROSCOPY  07/04/2012   Procedure: URETEROSCOPY;  Surgeon: Claybon Jabs, MD;  Location: [facility];  Service: Urology;  Laterality: Left;  Marland Kitchen VAGINAL HYSTERECTOMY  1998   Partial  . WISDOM TOOTH EXTRACTION     Social History   Social History Narrative  . No narrative on file     Objective: Vital Signs: BP 136/60 (BP Location: Left Arm, Patient Position: Sitting, Cuff Size: Normal)   Pulse 72   Resp 17   Ht 5\' 6"  (1.676 m)   Wt 225 lb (102.1 kg)   BMI 36.32 kg/m    Physical Exam  Constitutional: She is oriented to person, place, and time. She appears well-developed and well-nourished.  HENT:  Head: Normocephalic and atraumatic.  Eyes: Conjunctivae and EOM are normal.  Neck: Normal range of motion.  Cardiovascular: Normal rate, regular rhythm, normal heart sounds and intact distal pulses.   Pulmonary/Chest: Effort normal and breath sounds normal.  Abdominal: Soft. Bowel sounds are normal.  Lymphadenopathy:    She has no cervical adenopathy.  Neurological: She is alert and oriented to person, place,  and time.  Skin: Skin is warm and dry. Capillary refill takes less than 2 seconds.  Psychiatric: She has a normal mood and affect. Her behavior is normal.  Nursing note and vitals reviewed.    Musculoskeletal Exam: C-spine and thoracic lumbar spine limited range of motion. Shoulder joints elbow joints wrist joint MCPs PIPs DIPs with good range of motion with no synovitis. She tenderness on palpation over left trochanteric bursa. She had crepitus and discomfort range of motion of bilateral knee joints. There was some warmth on palpation of her left knee joint. Ankle joints MTPs PIPs DIPs with good range of motion with no synovitis.  CDAI Exam: No CDAI exam completed.    Investigation: No additional findings.   Imaging: Xr Knee 3 View Left  Result Date: 07/08/2017 Moderate medial compartment narrowing with possible chondrocalcinosis and lateral osteophyte. 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  Xr Knee 3 View Right  Result Date: 07/08/2017 Moderate medial compartment narrowing with intercondylar and lateral osteophytes. Severe patellofemoral narrowing. No chondrocalcinosis was noted. Impression: Moderate osteoarthritis and severe chondromalacia patella.   Speciality Comments: No specialty comments available.    Procedures:  Large Joint  Inj Date/Time: 07/08/2017 11:26 AM Performed by: Bo Merino Authorized by: Bo Merino   Consent Given by:  Patient Site marked: the procedure site was marked   Timeout: prior to procedure the correct patient, procedure, and site was verified   Indications:  Pain and joint swelling Location:  Knee Site:  L knee Prep: patient was prepped and draped in usual sterile fashion   Needle Size:  27 G Needle Length:  1.5 inches Approach:  Medial Ultrasound Guidance: No   Fluoroscopic Guidance: No   Arthrogram: No   Medications:  1.5 mL  lidocaine 1 %; 40 mg triamcinolone acetonide 40 MG/ML Aspiration Attempted: Yes   Aspirate amount (mL):  0 Patient tolerance:  Patient tolerated the procedure well with no immediate complications Large Joint Inj Date/Time: 07/08/2017 11:27 AM Performed by: Bo Merino Authorized by: Bo Merino   Consent Given by:  Patient Site marked: the procedure site was marked   Timeout: prior to procedure the correct patient, procedure, and site was verified   Indications:  Pain Location:  Hip Site:  L greater trochanter Prep: patient was prepped and draped in usual sterile fashion   Needle Size:  27 G Needle Length:  1.5 inches Approach:  Lateral Ultrasound Guidance: No   Fluoroscopic Guidance: No   Arthrogram: No   Medications:  40 mg triamcinolone acetonide 40 MG/ML; 1.5 mL lidocaine 1 % Aspiration Attempted: No   Aspirate amount (mL):  0 Patient tolerance:  Patient tolerated the procedure well with no immediate complications   Allergies: Clindamycin hcl   Assessment / Plan:     Visit Diagnoses: Trochanteric bursitis of left hip: Patient is severe pain and discomfort in her left trochanter which is been radiating down right event. After different treatment options were discussed left trochanteric area was injected with cortisone as described above. She tolerated the procedure well. I given her a handout on IT band exercises.  Chronic pain of both knees -she's been having pain and discomfort in her bilateral knee joints. She is swelling in her left knee joint. After different treatment options were discussed and informed consent was obtained left knee joint was prepped in sterile fashion injected with cortisone as described above. Weight loss diet and exercise was discussed. And also knee joint and muscle strengthening exercises were discussed. Plan: XR KNEE 3 VIEW RIGHT, XR KNEE 3 VIEW LEFT. X-ray showed moderate osteoarthritis and severe chondromalacia patella  bilaterally.  Fibromyalgia: She has generalized pain and discomfort from fibromyalgia.  DDD (degenerative disc disease), cervical: Doing fairly well  DDD (degenerative disc disease), thoracic: Chronic pain. : DDD (degenerative disc disease), lumbar: Chronic pain.  Primary osteoarthritis of both knees  Patient requested her desk adjustment for the office. Prescription for ergonomic evaluation was given.  History of insomnia: Her insomnia is better with medications.  Other medical problems are listed as follows:  History of hematuria  History of trigeminal neuralgia  History of diverticulitis - H/O rupture and repair    Orders: Orders Placed This Encounter  Procedures  . Large Joint Injection/Arthrocentesis  . Large Joint Injection/Arthrocentesis  . XR KNEE 3 VIEW RIGHT  . XR KNEE 3 VIEW LEFT   No orders of the defined types were placed in this encounter.   Face-to-face time spent with patient was 30 minutes. Greater than 50% of time was spent in counseling and coordination of care.  Follow-Up Instructions: Return in about 6 months (around 01/06/2018) for Fibromyalgia, osteoarthritis.   Bo Merino, MD  Note -  This record has been created using [employer].  Chart creation errors have been sought, but may not always  have been located. Such creation errors do not reflect on  the standard of medical care.

## 2017-07-08 ENCOUNTER — Ambulatory Visit (INDEPENDENT_AMBULATORY_CARE_PROVIDER_SITE_OTHER): Payer: Federal, State, Local not specified - PPO

## 2017-07-08 ENCOUNTER — Encounter: Payer: Self-pay | Admitting: Rheumatology

## 2017-07-08 ENCOUNTER — Ambulatory Visit (INDEPENDENT_AMBULATORY_CARE_PROVIDER_SITE_OTHER): Payer: Self-pay

## 2017-07-08 ENCOUNTER — Ambulatory Visit (INDEPENDENT_AMBULATORY_CARE_PROVIDER_SITE_OTHER): Payer: Federal, State, Local not specified - PPO | Admitting: Rheumatology

## 2017-07-08 VITALS — BP 136/60 | HR 72 | Resp 17 | Ht 66.0 in | Wt 225.0 lb

## 2017-07-08 DIAGNOSIS — M7062 Trochanteric bursitis, left hip: Secondary | ICD-10-CM

## 2017-07-08 DIAGNOSIS — M797 Fibromyalgia: Secondary | ICD-10-CM

## 2017-07-08 DIAGNOSIS — Z87448 Personal history of other diseases of urinary system: Secondary | ICD-10-CM

## 2017-07-08 DIAGNOSIS — M25562 Pain in left knee: Secondary | ICD-10-CM

## 2017-07-08 DIAGNOSIS — M5136 Other intervertebral disc degeneration, lumbar region: Secondary | ICD-10-CM

## 2017-07-08 DIAGNOSIS — M170 Bilateral primary osteoarthritis of knee: Secondary | ICD-10-CM | POA: Diagnosis not present

## 2017-07-08 DIAGNOSIS — Z8669 Personal history of other diseases of the nervous system and sense organs: Secondary | ICD-10-CM | POA: Diagnosis not present

## 2017-07-08 DIAGNOSIS — M25561 Pain in right knee: Secondary | ICD-10-CM

## 2017-07-08 DIAGNOSIS — M5134 Other intervertebral disc degeneration, thoracic region: Secondary | ICD-10-CM | POA: Diagnosis not present

## 2017-07-08 DIAGNOSIS — Z8719 Personal history of other diseases of the digestive system: Secondary | ICD-10-CM | POA: Diagnosis not present

## 2017-07-08 DIAGNOSIS — M5030 Other cervical disc degeneration, unspecified cervical region: Secondary | ICD-10-CM | POA: Diagnosis not present

## 2017-07-08 DIAGNOSIS — Z87898 Personal history of other specified conditions: Secondary | ICD-10-CM

## 2017-07-08 DIAGNOSIS — G8929 Other chronic pain: Secondary | ICD-10-CM | POA: Diagnosis not present

## 2017-07-08 MED ORDER — LIDOCAINE HCL 1 % IJ SOLN
1.5000 mL | INTRAMUSCULAR | Status: AC | PRN
  Administered 2017-07-08: 1.5 mL

## 2017-07-08 MED ORDER — TRIAMCINOLONE ACETONIDE 40 MG/ML IJ SUSP
40.0000 mg | INTRAMUSCULAR | Status: AC | PRN
  Administered 2017-07-08: 40 mg via INTRA_ARTICULAR

## 2017-07-08 NOTE — Patient Instructions (Signed)
Iliotibial Band Syndrome Rehab Ask your health care provider which exercises are safe for you. Do exercises exactly as told by your health care provider and adjust them as directed. It is normal to feel mild stretching, pulling, tightness, or discomfort as you do these exercises, but you should stop right away if you feel sudden pain or your pain gets worse.Do not begin these exercises until told by your health care provider. Stretching and range of motion exercises These exercises warm up your muscles and joints and improve the movement and flexibility of your hip and pelvis. Exercise A: Quadriceps, prone  1. Lie on your abdomen on a firm surface, such as a bed or padded floor. 2. Bend your left / right knee and hold your ankle. If you cannot reach your ankle or pant leg, loop a belt around your foot and grab the belt instead. 3. Gently pull your heel toward your buttocks. Your knee should not slide out to the side. You should feel a stretch in the front of your thigh and knee. 4. Hold this position for __________ seconds. Repeat __________ times. Complete this stretch __________ times a day. Exercise B: Iliotibial band  1. Lie on your side with your left / right leg in the top position. 2. Bend both of your knees and grab your left / right ankle. Stretch out your bottom arm to help you balance. 3. Slowly bring your top knee back so your thigh goes behind your trunk. 4. Slowly lower your top leg toward the floor until you feel a gentle stretch on the outside of your left / right hip and thigh. If you do not feel a stretch and your knee will not fall farther, place the heel of your other foot on top of your knee and pull your knee down toward the floor with your foot. 5. Hold this position for __________ seconds. Repeat __________ times. Complete this stretch __________ times a day. Strengthening exercises These exercises build strength and endurance in your hip and pelvis. Endurance is the  ability to use your muscles for a long time, even after they get tired. Exercise C: Straight leg raises ( hip abductors) 1. Lie on your side with your left / right leg in the top position. Lie so your head, shoulder, knee, and hip line up. You may bend your bottom knee to help you balance. 2. Roll your hips slightly forward so your hips are stacked directly over each other and your left / right knee is facing forward. 3. Tense the muscles in your outer thigh and lift your top leg 4-6 inches (10-15 cm). 4. Hold this position for __________ seconds. 5. Slowly return to the starting position. Let your muscles relax completely before doing another repetition. Repeat __________ times. Complete this exercise __________ times a day. Exercise D: Straight leg raises ( hip extensors) 1. Lie on your abdomen on your bed or a firm surface. You can put a pillow under your hips if that is more comfortable. 2. Bend your left / right knee so your foot is straight up in the air. 3. Squeeze your buttock muscles and lift your left / right thigh off the bed. Do not let your back arch. 4. Tense this muscle as hard as you can without increasing any knee pain. 5. Hold this position for __________ seconds. 6. Slowly lower your leg to the starting position and allow it to relax completely. Repeat __________ times. Complete this exercise __________ times a day. Exercise E: Hip   hike 1. Stand sideways on a bottom step. Stand on your left / right leg with your other foot unsupported next to the step. You can hold onto the railing or wall if needed for balance. 2. Keep your knees straight and your torso square. Then, lift your left / right hip up toward the ceiling. 3. Slowly let your left / right hip lower toward the floor, past the starting position. Your foot should get closer to the floor. Do not lean or bend your knees. Repeat __________ times. Complete this exercise __________ times a day. This information is not  intended to replace advice given to you by your health care provider. Make sure you discuss any questions you have with your health care provider. Document Released: 08/27/2005 Document Revised: 05/01/2016 Document Reviewed: 07/29/2015 Elsevier Interactive Patient Education  2018 [employer] Knee Exercises Ask your health care provider which exercises are safe for you. Do exercises exactly as told by your health care provider and adjust them as directed. It is normal to feel mild stretching, pulling, tightness, or discomfort as you do these exercises, but you should stop right away if you feel sudden pain or your pain gets worse.Do not begin these exercises until told by your health care provider. STRETCHING AND RANGE OF MOTION EXERCISES These exercises warm up your muscles and joints and improve the movement and flexibility of your knee. These exercises also help to relieve pain, numbness, and tingling. Exercise A: Knee Extension, Prone 1. Lie on your abdomen on a bed. 2. Place your left / right knee just beyond the edge of the surface so your knee is not on the bed. You can put a towel under your left / right thigh just above your knee for comfort. 3. Relax your leg muscles and allow gravity to straighten your knee. You should feel a stretch behind your left / right knee. 4. Hold this position for __________ seconds. 5. Scoot up so your knee is supported between repetitions. Repeat __________ times. Complete this stretch __________ times a day. Exercise B: Knee Flexion, Active  1. Lie on your back with both knees straight. If this causes back discomfort, bend your left / right knee so your foot is flat on the floor. 2. Slowly slide your left / right heel back toward your buttocks until you feel a gentle stretch in the front of your knee or thigh. 3. Hold this position for __________ seconds. 4. Slowly slide your left / right heel back to the starting position. Repeat __________ times.  Complete this exercise __________ times a day. Exercise C: Quadriceps, Prone  1. Lie on your abdomen on a firm surface, such as a bed or padded floor. 2. Bend your left / right knee and hold your ankle. If you cannot reach your ankle or pant leg, loop a belt around your foot and grab the belt instead. 3. Gently pull your heel toward your buttocks. Your knee should not slide out to the side. You should feel a stretch in the front of your thigh and knee. 4. Hold this position for __________ seconds. Repeat __________ times. Complete this stretch __________ times a day. Exercise D: Hamstring, Supine 1. Lie on your back. 2. Loop a belt or towel over the ball of your left / right foot. The ball of your foot is on the walking surface, right under your toes. 3. Straighten your left / right knee and slowly pull on the belt to raise your leg until you feel a gentle   stretch behind your knee. ? Do not let your left / right knee bend while you do this. ? Keep your other leg flat on the floor. 4. Hold this position for __________ seconds. Repeat __________ times. Complete this stretch __________ times a day. STRENGTHENING EXERCISES These exercises build strength and endurance in your knee. Endurance is the ability to use your muscles for a long time, even after they get tired. Exercise E: Quadriceps, Isometric  1. Lie on your back with your left / right leg extended and your other knee bent. Put a rolled towel or small pillow under your knee if told by your health care provider. 2. Slowly tense the muscles in the front of your left / right thigh. You should see your kneecap slide up toward your hip or see increased dimpling just above the knee. This motion will push the back of the knee toward the floor. 3. For __________ seconds, keep the muscle as tight as you can without increasing your pain. 4. Relax the muscles slowly and completely. Repeat __________ times. Complete this exercise __________ times a  day. Exercise F: Straight Leg Raises - Quadriceps 1. Lie on your back with your left / right leg extended and your other knee bent. 2. Tense the muscles in the front of your left / right thigh. You should see your kneecap slide up or see increased dimpling just above the knee. Your thigh may even shake a bit. 3. Keep these muscles tight as you raise your leg 4-6 inches (10-15 cm) off the floor. Do not let your knee bend. 4. Hold this position for __________ seconds. 5. Keep these muscles tense as you lower your leg. 6. Relax your muscles slowly and completely after each repetition. Repeat __________ times. Complete this exercise __________ times a day. Exercise G: Hamstring, Isometric 1. Lie on your back on a firm surface. 2. Bend your left / right knee approximately __________ degrees. 3. Dig your left / right heel into the surface as if you are trying to pull it toward your buttocks. Tighten the muscles in the back of your thighs to dig as hard as you can without increasing any pain. 4. Hold this position for __________ seconds. 5. Release the tension gradually and allow your muscles to relax completely for __________ seconds after each repetition. Repeat __________ times. Complete this exercise __________ times a day. Exercise H: Hamstring Curls  If told by your health care provider, do this exercise while wearing ankle weights. Begin with __________ weights. Then increase the weight by 1 lb (0.5 kg) increments. Do not wear ankle weights that are more than __________. 1. Lie on your abdomen with your legs straight. 2. Bend your left / right knee as far as you can without feeling pain. Keep your hips flat against the floor. 3. Hold this position for __________ seconds. 4. Slowly lower your leg to the starting position.  Repeat __________ times. Complete this exercise __________ times a day. Exercise I: Squats (Quadriceps) 1. Stand in front of a table, with your feet and knees pointing  straight ahead. You may rest your hands on the table for balance but not for support. 2. Slowly bend your knees and lower your hips like you are going to sit in a chair. ? Keep your weight over your heels, not over your toes. ? Keep your lower legs upright so they are parallel with the table legs. ? Do not let your hips go lower than your knees. ? Do not bend   lower than told by your health care provider. ? If your knee pain increases, do not bend as low. 3. Hold the squat position for __________ seconds. 4. Slowly push with your legs to return to standing. Do not use your hands to pull yourself to standing. Repeat __________ times. Complete this exercise __________ times a day. Exercise J: Wall Slides (Quadriceps)  1. Lean your back against a smooth wall or door while you walk your feet out 18-24 inches (46-61 cm) from it. 2. Place your feet hip-width apart. 3. Slowly slide down the wall or door until your knees bend __________ degrees. Keep your knees over your heels, not over your toes. Keep your knees in line with your hips. 4. Hold for __________ seconds. Repeat __________ times. Complete this exercise __________ times a day. Exercise K: Straight Leg Raises - Hip Abductors 1. Lie on your side with your left / right leg in the top position. Lie so your head, shoulder, knee, and hip line up. You may bend your bottom knee to help you keep your balance. 2. Roll your hips slightly forward so your hips are stacked directly over each other and your left / right knee is facing forward. 3. Leading with your heel, lift your top leg 4-6 inches (10-15 cm). You should feel the muscles in your outer hip lifting. ? Do not let your foot drift forward. ? Do not let your knee roll toward the ceiling. 4. Hold this position for __________ seconds. 5. Slowly return your leg to the starting position. 6. Let your muscles relax completely after each repetition. Repeat __________ times. Complete this exercise  __________ times a day. Exercise L: Straight Leg Raises - Hip Extensors 1. Lie on your abdomen on a firm surface. You can put a pillow under your hips if that is more comfortable. 2. Tense the muscles in your buttocks and lift your left / right leg about 4-6 inches (10-15 cm). Keep your knee straight as you lift your leg. 3. Hold this position for __________ seconds. 4. Slowly lower your leg to the starting position. 5. Let your leg relax completely after each repetition. Repeat __________ times. Complete this exercise __________ times a day. This information is not intended to replace advice given to you by your health care provider. Make sure you discuss any questions you have with your health care provider. Document Released: 07/11/2005 Document Revised: 05/21/2016 Document Reviewed: 07/03/2015 Elsevier Interactive Patient Education  2018 [employer]  

## 2017-07-22 ENCOUNTER — Ambulatory Visit: Payer: Federal, State, Local not specified - PPO | Admitting: Internal Medicine

## 2017-07-22 ENCOUNTER — Other Ambulatory Visit (INDEPENDENT_AMBULATORY_CARE_PROVIDER_SITE_OTHER): Payer: Federal, State, Local not specified - PPO

## 2017-07-22 ENCOUNTER — Encounter: Payer: Self-pay | Admitting: Internal Medicine

## 2017-07-22 VITALS — BP 138/72 | HR 80 | Temp 98.9°F | Ht 66.0 in | Wt 225.0 lb

## 2017-07-22 DIAGNOSIS — Z1231 Encounter for screening mammogram for malignant neoplasm of breast: Secondary | ICD-10-CM

## 2017-07-22 DIAGNOSIS — Z1239 Encounter for other screening for malignant neoplasm of breast: Secondary | ICD-10-CM

## 2017-07-22 DIAGNOSIS — R634 Abnormal weight loss: Secondary | ICD-10-CM | POA: Insufficient documentation

## 2017-07-22 HISTORY — DX: Abnormal weight loss: R63.4

## 2017-07-22 LAB — CBC
HCT: 37.7 % (ref 36.0–46.0)
Hemoglobin: 12.2 g/dL (ref 12.0–15.0)
MCHC: 32.3 g/dL (ref 30.0–36.0)
MCV: 86.8 fl (ref 78.0–100.0)
Platelets: 371 10*3/uL (ref 150.0–400.0)
RBC: 4.34 Mil/uL (ref 3.87–5.11)
RDW: 14.8 % (ref 11.5–15.5)
WBC: 9.7 10*3/uL (ref 4.0–10.5)

## 2017-07-22 LAB — COMPREHENSIVE METABOLIC PANEL
ALT: 14 U/L (ref 0–35)
AST: 15 U/L (ref 0–37)
Albumin: 4.2 g/dL (ref 3.5–5.2)
Alkaline Phosphatase: 91 U/L (ref 39–117)
BUN: 11 mg/dL (ref 6–23)
CO2: 31 mEq/L (ref 19–32)
Calcium: 9.8 mg/dL (ref 8.4–10.5)
Chloride: 101 mEq/L (ref 96–112)
Creatinine, Ser: 0.85 mg/dL (ref 0.40–1.20)
GFR: 86.52 mL/min (ref >60.00)
Glucose, Bld: 108 mg/dL — ABNORMAL HIGH (ref 70–99)
Potassium: 4.2 mEq/L (ref 3.5–5.1)
Sodium: 140 mEq/L (ref 135–145)
Total Bilirubin: 0.7 mg/dL (ref 0.2–1.2)
Total Protein: 7.5 g/dL (ref 6.0–8.3)

## 2017-07-22 LAB — LIPID PANEL
Cholesterol: 203 mg/dL — ABNORMAL HIGH (ref 0–200)
HDL: 78.1 mg/dL (ref >39.00)
LDL Cholesterol: 117 mg/dL — ABNORMAL HIGH (ref 0–99)
NonHDL: 125.21
Total CHOL/HDL Ratio: 3
Triglycerides: 43 mg/dL (ref 0.0–149.0)
VLDL: 8.6 mg/dL (ref 0.0–40.0)

## 2017-07-22 LAB — VITAMIN D 25 HYDROXY (VIT D DEFICIENCY, FRACTURES): VITD: 9.29 ng/mL — ABNORMAL LOW (ref 30.00–100.00)

## 2017-07-22 LAB — VITAMIN B12: Vitamin B-12: 419 pg/mL (ref 211–911)

## 2017-07-22 LAB — TSH: TSH: 1.48 u[IU]/mL (ref 0.35–4.50)

## 2017-07-22 LAB — HEMOGLOBIN A1C: Hgb A1c MFr Bld: 5.6 % (ref 4.6–6.5)

## 2017-07-22 NOTE — Progress Notes (Signed)
   Subjective:    Patient ID: Summer [name], female    DOB: December 12, 1950, 66 y.o.   MRN: [id number]  HPI The patient is a 66 YO female coming in for weight loss. She is not sure when this started but thinks she used to be around 260 and is now 225. She was about 15 pounds heavier about 8 months ago. She stopped drinking sodas as well as eating meat about 3 months ago. She did not think she did anything to lose weight. She denies chest pains, SOB, cough, abdominal pain, blood in stool, diarrhea, constipation. She denies fevers or chills. She denies smoking. She does have some frequency but she is drinking more water and some coffee per the urologist's suggestion.   Review of Systems  Constitutional: Positive for unexpected weight change. Negative for activity change, appetite change, chills, fatigue and fever.  HENT: Negative.   Eyes: Negative.   Respiratory: Negative for cough, chest tightness and shortness of breath.   Cardiovascular: Negative for chest pain, palpitations and leg swelling.  Gastrointestinal: Negative for abdominal distention, abdominal pain, constipation, diarrhea, nausea and vomiting.  Genitourinary: Positive for frequency.  Musculoskeletal: Positive for arthralgias.  Skin: Negative.   Neurological: Negative.   Psychiatric/Behavioral: Negative.       Objective:   Physical Exam  Constitutional: She is oriented to person, place, and time. She appears well-developed and well-nourished.  HENT:  Head: Normocephalic and atraumatic.  Eyes: EOM are normal.  Neck: Normal range of motion.  Cardiovascular: Normal rate and regular rhythm.  Pulmonary/Chest: Effort normal and breath sounds normal. No respiratory distress. She has no wheezes. She has no rales.  Abdominal: Soft. Bowel sounds are normal. She exhibits no distension. There is no tenderness. There is no rebound.  Musculoskeletal: She exhibits no edema.  Neurological: She is alert and oriented to person, place, and  time. Coordination normal.  Skin: Skin is warm and dry.  Psychiatric: She has a normal mood and affect.   Vitals:   07/22/17 0849  BP: 138/72  Pulse: 80  Temp: 98.9 F (37.2 C)  TempSrc: Oral  SpO2: 99%  Weight: 225 lb (102.1 kg)  Height: 5\' 6"  (1.676 m)      Assessment & Plan:

## 2017-07-22 NOTE — Patient Instructions (Signed)
We will have you watch the weight to see if it levels off.  We are checking the labs today and ordered the mammogram.

## 2017-07-22 NOTE — Assessment & Plan Note (Addendum)
Checking labs for metabolic cause. Ordered mammogram as this is not up to date. Colonoscopy up to date. Non-smoker. No localizing symptoms. She has stopped meat and sodas which could be contributing some to her weight change. She will work on eating more snacks and return in 3 months for weight check. She will call if more weight loss sooner.

## 2017-07-25 ENCOUNTER — Other Ambulatory Visit: Payer: Self-pay | Admitting: Internal Medicine

## 2017-07-25 MED ORDER — VITAMIN D (ERGOCALCIFEROL) 1.25 MG (50000 UNIT) PO CAPS: 50000.0000 [IU] | ORAL_CAPSULE | ORAL | 0 refills | Status: DC

## 2017-08-14 ENCOUNTER — Ambulatory Visit
Admission: RE | Admit: 2017-08-14 | Discharge: 2017-08-14 | Disposition: A | Discharge status: Home / Self Care | Payer: Federal, State, Local not specified - PPO | Source: Ambulatory Visit | Attending: Internal Medicine | Admitting: Internal Medicine

## 2017-08-14 ENCOUNTER — Telehealth: Payer: Self-pay | Admitting: Rheumatology

## 2017-08-14 DIAGNOSIS — Z1239 Encounter for other screening for malignant neoplasm of breast: Secondary | ICD-10-CM

## 2017-08-14 NOTE — Telephone Encounter (Signed)
Opened in error

## 2017-08-15 ENCOUNTER — Other Ambulatory Visit: Payer: Self-pay | Admitting: Internal Medicine

## 2017-08-15 DIAGNOSIS — R928 Other abnormal and inconclusive findings on diagnostic imaging of breast: Secondary | ICD-10-CM

## 2017-08-20 ENCOUNTER — Ambulatory Visit: Payer: Self-pay | Admitting: *Deleted

## 2017-08-20 ENCOUNTER — Telehealth: Payer: Self-pay | Admitting: Internal Medicine

## 2017-08-20 NOTE — Telephone Encounter (Signed)
To: [facility] Nurse Triage  Pt has scheduled an appt with her PCP for 12/14 via MyChart. The reason for appt she has given is concerning to me and I would like for her to be triaged to ensure the appt is appropriate for her symptoms. Thank you!

## 2017-08-20 NOTE — Telephone Encounter (Signed)
Called pt regarding the message left in MyChart. Pt states "extreme pain in left arm from wrist to elbow and extreme pain in left side from back to midway front stomach area to upper chest" and this is a night. Also she states "extreme cold right hand only".  This pain has been going on for about a month now.No urgency in making an appointment for now. Pt had an appointment for this Friday but decided to change it to next Friday the 21st.   Reason for Disposition . [1] MODERATE back pain (e.g., interferes with normal activities) AND [2] present > 3 days  Answer Assessment - Initial Assessment Questions 1. LOCATION: "Where does it hurt?"       Starts at back and goes to mid chest 2. RADIATION: "Does the pain go anywhere else?" (e.g., into neck, jaw, arms, back)     Left side 3. ONSET: "When did the chest pain begin?" (Minutes, hours or days)      A month ago 4. PATTERN "Does the pain come and go, or has it been constant since it started?"  "Does it get worse with exertion?"      Constant at night 5. DURATION: "How long does it last" (e.g., seconds, minutes, hours)     Once and a while it will wake her up, but mostly when she is trying to get up 6. SEVERITY: "How bad is the pain?"  (e.g., Scale 1-10; mild, moderate, or severe)    - MILD (1-3): doesn't interfere with normal activities     - MODERATE (4-7): interferes with normal activities or awakens from sleep    - SEVERE (8-10): excruciating pain, unable to do any normal activities       10 7. CARDIAC RISK FACTORS: "Do you have any history of heart problems or risk factors for heart disease?" (e.g., prior heart attack, angina; high blood pressure, diabetes, being overweight, high cholesterol, smoking, or strong family history of heart disease)     Overweight 8. PULMONARY RISK FACTORS: "Do you have any history of lung disease?"  (e.g., blood clots in lung, asthma, emphysema, birth control pills)     no 9. CAUSE: "What do you think is causing  the chest pain?"     Not sure 10. OTHER SYMPTOMS: "Do you have any other symptoms?" (e.g., dizziness, nausea, vomiting, sweating, fever, difficulty breathing, cough)       no 11. PREGNANCY: "Is there any chance you are pregnant?" "When was your last menstrual period?"       no  Answer Assessment - Initial Assessment Questions 1. ONSET: "When did the pain begin?"      At night, has been going on for about a month now. 2. LOCATION: "Where does it hurt?" (upper, mid or lower back)     Mid back to left arm and upper abdomen 3. SEVERITY: "How bad is the pain?"  (e.g., Scale 1-10; mild, moderate, or severe)   - MILD (1-3): doesn't interfere with normal activities    - MODERATE (4-7): interferes with normal activities or awakens from sleep    - SEVERE (8-10): excruciating pain, unable to do any normal activities      Severe at night 4. PATTERN: "Is the pain constant?" (e.g., yes, no; constant, intermittent)      Constant at night and only when trying to get out of bed 5. RADIATION: "Does the pain shoot into your legs or elsewhere?"     Just to the left arm and upper abd  6. CAUSE:  "What do you think is causing the back pain?"      Not sure 7. BACK OVERUSE:  "Any recent lifting of heavy objects, strenuous work or exercise?"     no 8. MEDICATIONS: "What have you taken so far for the pain?" (e.g., nothing, acetaminophen, NSAIDS)     none 9. NEUROLOGIC SYMPTOMS: "Do you have any weakness, numbness, or problems with bowel/bladder control?"     no 10. OTHER SYMPTOMS: "Do you have any other symptoms?" (e.g., fever, abdominal pain, burning with urination, blood in urine)       no 11. PREGNANCY: "Is there any chance you are pregnant?" (e.g., yes, no; LMP)       no  Protocols used: BACK PAIN-A-AH, CHEST PAIN-A-AH

## 2017-08-20 NOTE — Telephone Encounter (Signed)
FYI to Dr. Sharlet Salina. I asked RN Triage to call patient as her "reason for visit" when she scheduled her MyChart appt was lengthy and concerning.

## 2017-08-20 NOTE — Telephone Encounter (Signed)
See triage encounter. Appointment changed per pt request.

## 2017-08-21 ENCOUNTER — Ambulatory Visit
Admission: RE | Admit: 2017-08-21 | Discharge: 2017-08-21 | Disposition: A | Discharge status: Home / Self Care | Payer: Federal, State, Local not specified - PPO | Source: Ambulatory Visit | Attending: Internal Medicine | Admitting: Internal Medicine

## 2017-08-21 DIAGNOSIS — R928 Other abnormal and inconclusive findings on diagnostic imaging of breast: Secondary | ICD-10-CM

## 2017-08-23 ENCOUNTER — Ambulatory Visit: Payer: Self-pay | Admitting: Internal Medicine

## 2017-08-30 ENCOUNTER — Ambulatory Visit: Payer: Self-pay | Admitting: Internal Medicine

## 2017-09-10 HISTORY — PX: OTHER SURGICAL HISTORY: SHX169

## 2017-10-09 NOTE — Progress Notes (Signed)
[facility]  PATIENT: Summer Summer [name] DOB: Oct 03, 1950   REASON FOR VISIT : follow-up for  right facial pain HISTORY FROM: patient    HISTORY OF PRESENT ILLNESS:Summer Summer [name] is a 67 years old right-handed African American female, referred by her primary care physician Dr. Stevie [name] for evaluation of right facial pain  She was diagnosed with right trigeminal neuralgia in 2006, presenting with severe electronics shooting pain at her right upper molar, she had microvascular decompression surgery at [facility] by Dr. Harrison [name] around 2006, which has helped the severe pain, but she continued to have annoying almost constant pain at her right face, she describes hot cold sensitivity over right face, touching of right forehead region will also set of her right facial pain, she felt right tongue swelling, she bites on her right tongue sometimes, she works at customer service, on the phone many hours each day, with prolonged talking, she felt sandpaper was rubbing against her right tongue, also complains of right upper and lower lip numbness, intermittent electronics shooting pain,  Over the years, she has tried different medication, was evaluated by our office Dr. Brett [name] in 2008, she has tried gabapentin, Lyrica, Cymbalta, Trileptal, currently is taking Tegretol 200 mg twice a day, initially it helps her some, but failed to help work consistently, She denied visual change, recent few months of left lower back, left hip pain, radiating to left posterior thigh, to her left leg and left foot, difficulty walking after prolonged sitting She complains of left low back pain, left hip pain, radiating pain to left foot, like walking on a dead, burning sensation, she continued to complains of right facial constant achy pain, there was no significant change after started on Oxtellar xR 300 mg every day, no significant side effects,  I have reviewed MRI, mild small vessel disease,  no significant abnormality otherwise,  She had MRI lumbar spine (without) demonstrating: L5-S1: facet hypertrophy with moderate right and severe left foraminal stenosis  L4-5: pseudo disc bulging and facet hypertrophy with moderate right and mild left foraminal stenosis. There is 2 mm anterior spondylolisthesis of L4 on L5.  Degenerative spondylosis and disc bulging L1-2 and L4-5.  UPDATE Sep 30 2014:YY She felt sand paper sensation in her right tongue, flashing sensation, last 1-2 weeks, only take gabapentin 374m tid now. She also complains of snoring, frequent wakening during the nighttime, excessive sleepiness at daytime, F ESS score is 37, ESS is 9  UPDATE 01/26/2017CMMs. Summer [name] 67year old female returns for follow-up. She has a history of right trigeminal neuralgia is currently on Neurontin 300 mg 3 times a day has breakthrough pain. 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 She occasionally will have upper and lower lip numbness. 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,  Update March 01 2016:YY She presented to the emergency room February 21 2016 for nausea, unexpected fall, night before that because severe facial pain, she took extra dose of lamotrigine, made it to 250 mg that particular day, laboratory evaluation showed mildly low potassium of CMP WAS GIVEN SUPPLEMENT,  NORMAL CBC, lamotrigine level in June 15 was 15.6,  For a while, she had severe right facial pain, was taking gabapentin 300 mg 2 tablets every 2 hours, 3 tablets at night, about 11 tablets each day 3300 mg every day  plus lamotrigine 100 mg twice a day,   Lamotrigine 100 mg twice a day has been very helpful, it has taking care of her long bothered right tongue sandpaper sensation. She is now taking gabapentin 300 mg 3/2/3 tablets each day, she has been off lamotrigine since February 26 2016, the sandpaper sensation on the right side of the tongue came back, she is now taking Cymbalta 30 mg 3 times a day  She has been complains 3 weeks history of urinary urgency, difficulty initiating urine, UA showed mild UTI, was treated with Cipro without improving her symptoms  UPDATE May 10 2016:YY She was evaluated by Summer Summer [name] in July 2017, is planning on to have gamma knife in early September, she complains of mild memory loss, difficulty handling her computer tasks sometimes, there was no family history of memory loss, she also complains of episode, right after she get out of the bed, She is on polypharmacy, now taking lamotrigine 100 mg twice a day, gabapentin 300 mg 4 in the morning, 2 at lunch time, 4 tablets at nighttime. She also has obesity, snoring, excessive daytime fatigue and sleepiness  We have personally reviewed CT head without contrast in June 2017, no acute abnormality, MRI of the cervical in 2008, multilevel degenerative changes, most severe at C5-6, C6-7, with herniated disc, mild canal stenosis, mild cord signal changes.  Laboratory evaluation in 2017, elevated WBC 11 point 8, hemoglobin of 13, no significant abnormality on BMP  UPDATE Apr 02 2017:YY We have personally reviewed MRI of the brain in April 2018 generalized atrophy mild supratentorium small vessel disease MRI of cervical spine April, multilevel degenerative changes most severe at C6-7, mild canal stenosissignificant foraminal narrowing or signal changes,  She had a, knife by [facility] Dr. Angelene Summer [name] in September 2017 she denies significant improvement, now cold air still trigger her right facial pain, bleeding from right  temporal region along the right face, right jaw, electronic shooting pain, to her right lower molar region, she has intermittent left hand tremor, driving,  She is now taking Lamictal 100 mg 3 tablets each day, gabapentin 600 mg 5 tablets each day, despite combination therapy, she remains symptomatic, she is able to go to work.  She is on the phone 10 hours a day, mild memory loss. I reviewed laboratory evaluation in 2018, negative troponin, CBC with hemoglobin of 11.9, CMP, creatinine of 0.84, ESR, mild elevated C reactive protein 7.8, lamotrigine 12.9, TSH 1.2 7,  UPDATE 06/26/17 CM Summer Summer [name], 67 year old female returns for follow-up with history of trigeminal neuralgia. Her facial pain is not controlled. Patient continues to have right-sided facial pain and her tongue feels like sandpaper after being on her job for 10 hours a day. She has gargled with salt water. She works at a call center.  She is currently on Lamictal 100 twice daily, Oxtellar XR 300 mg daily, gabapentin 670m 2 tabs in the a.m., 3 tabs at lunch 1-2 at  Night. She denies side effects to the meds.She denies any falls. Tremor is stable She had Gamma knife by Dr. TSalomon Fickin September 2017. She has not followed up with him. He gabapentin level XI.2 on 04/02/2017. Lamictal level 11.3.These are therapeutic CBC and CMP in June within normal limits .  She returns for reevaluation UPDATE 1/31/2019CM.  Summer Summer [name], 67 year old female returns for follow-up with history of right facial pain.  When last seen her Oxtellar dose was increased with good benefit.  Her facial pain is in control.  She is also on gabapentin and Lamictal.  She denies any side effects to the medication.   She had gamma knife surgery by Summer Summer [name] in September 2017 Most recent comprehensive metabolic panel with sodium level of 140.and CBC within normal limits.  She continues to work at a call center.  She returns for reevaluation REVIEW OF SYSTEMS: Full 14 system review of systems  performed and notable only for those listed, all others are neg:  Constitutional: neg  Cardiovascular: neg Ear/Nose/Throat: neg  Skin: neg Eyes: neg Respiratory: neg Gastroitestinal: neg  Hematology/Lymphatic: neg  Endocrine: neg Musculoskeletal:neg Allergy/Immunology: neg Neurological:  facial pain,  Psychiatric: neg Sleep : neg   ALLERGIES: Allergies  Allergen Reactions  . Clindamycin Hcl Itching    itch    HOME MEDICATIONS: Outpatient Medications Prior to Visit  Medication Sig Dispense Refill  . acetaminophen (TYLENOL) 500 MG tablet Take 1,000 mg by mouth every 6 (six) hours as needed.    . Aspirin-Acetaminophen-Caffeine (GOODY HEADACHE PO) Take 1 packet by mouth daily as needed (headache).    . ciclopirox (LOPROX) 0.77 % cream     . gabapentin (NEURONTIN) 600 MG tablet Take two capsules in am, 3 at lunch,1-2 at night 210 tablet 6  . hydrocortisone 2.5 % cream     . ketoconazole (NIZORAL) 2 % shampoo     . lamoTRIgine (LAMICTAL) 100 MG tablet TAKE 1 TABLET BY MOUTH EVERY MORNING AND TAKE 2 TABLETS BY MOUTH AT NIGHT (Patient taking differently: TAKE 1 TABLET BY MOUTH EVERY MORNING AND TAKE 1 TABLET BY MOUTH AT NIGHT) 270 tablet 3  . OXcarbazepine ER (OXTELLAR XR) 300 MG TB24 Take 600 mg by mouth at bedtime. 60 tablet 4  . Vitamin D, Ergocalciferol, (DRISDOL) 50000 units CAPS capsule Take 1 capsule (50,000 Units total) every 7 (seven) days by mouth. 12 capsule 0   No facility-administered medications prior to visit.     PAST MEDICAL HISTORY: Past Medical History:  Diagnosis Date  . Arthritis shoulders and back  . 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 Diverticulosis   . Fibromyalgia   . History of kidney stones   . History of kidney stones   . History of shingles 2012--  no residual pain  . Left ureteral calculus   . MCI (mild cognitive impairment)   . Trigeminal neuralgia RIGHT  . Urgency of urination      PAST SURGICAL HISTORY: Past Surgical History:  Procedure Laterality Date  . CEREBRAL MICROVASCULAR DECOMPRESSION  07-05-2006   RIGHT TRIGEMINAL NERVE  . CYSTOSCOPY/RETROGRADE/URETEROSCOPY/STONE EXTRACTION WITH BASKET  07/04/2012   Procedure: CYSTOSCOPY/RETROGRADE/URETEROSCOPY/STONE EXTRACTION WITH BASKET;  Surgeon: Claybon Jabs, MD;  Location: [facility];  Service: Urology;  Laterality: Left;  . EXPLORATORY LAPAROTOMY/ RESECTION MID TO DISTAL SIGMOID AND PROXIMAL RECTUM/ END PROXIMAL SIGMOID COLOSTOMY  07-17-2006   PERFORATED DIVERTICULITIS WITH PERITONITIS  . gamma knife  05/17/2016   for trigeminal neuralgia, [facility], Dr Salomon [name]  . RESECTION COLOSTOMY/ CLOSURE COLOSTOMY WITH COLOPROCTOSTOMY  02-20-2007  . TONSILLECTOMY  AS CHILD  . URETEROSCOPY  07/04/2012   Procedure: URETEROSCOPY;  Surgeon: Claybon Jabs, MD;  Location: [facility];  Service: Urology;  Laterality: Left;  .  VAGINAL HYSTERECTOMY  1998   Partial  . WISDOM TOOTH EXTRACTION      FAMILY HISTORY: Family History  Problem Relation Age of Onset  . Heart disease Maternal Grandmother   . Diabetes Mother   . Breast cancer Maternal Aunt   . Lupus Cousin   . Colon cancer Neg Hx   . Esophageal cancer Neg Hx   . Rectal cancer Neg Hx   . Stomach cancer Neg Hx     SOCIAL HISTORY: Social History   Socioeconomic History  . Marital status: Married    Spouse name: Not on file  . Number of children: 1  . Years of education: College  . Highest education level: Not on file  Social Needs  . Financial resource strain: Not on file  . Food insecurity - worry: Not on file  . Food insecurity - inability: Not on file  . Transportation needs - medical: Not on file  . Transportation needs - non-medical: Not on file  Occupational History    Employer: UNEMPLOYED  . Occupation: HUMAN RESOURCES     Employer: [employer]  Tobacco Use  . Smoking status: Never Smoker  . Smokeless tobacco:  Never Used  Substance and Sexual Activity  . Alcohol use: Yes    Comment: 1 EVERY 2 MONTHS  . Drug use: No  . Sexual activity: Not on file  Other Topics Concern  . Not on file  Social History Narrative  . Not on file     PHYSICAL EXAM  Vitals:   10/10/17 0810  BP: 133/82  Pulse: 89  Weight: 217 lb 6.4 oz (98.6 kg)  Height: 5' 6"  (1.676 m)   Body mass index is 35.09 kg/m.  Generalized: Well developed, Obese female in no acute distress  Head: normocephalic and atraumatic,. Oropharynx benign  Neck: Supple,  Musculoskeletal: No deformity   Neurological examination   Mentation: Alert oriented to time, place, history taking. Attention span and concentration appropriate. Recent and remote memory intact.  Follows all commands speech and language fluent.  Cranial nerve II-XII: Pupils were equal round reactive to light extraocular movements were full, visual field were full on confrontational test. Facial sensation and strength were normal. hearing was intact to finger rubbing bilaterally. Uvula tongue midline. head turning and shoulder shrug were normal and symmetric.Tongue protrusion into cheek strength was normal. Motor: normal bulk and tone, full strength in the BUE, BLE, fine finger movements normal, no pronator drift. No tremor Sensory: normal and symmetric to light touch, pinprick, and  Vibration, in the upper and lower extremities Coordination: finger-nose-finger, heel-to-shin bilaterally, no dysmetria Reflexes: Brachioradialis 2/2, biceps 2/2, triceps 2/2, patellar 2/2, Achilles 2/2, plantar responses were flexor bilaterally. Gait and Station: Rising up from seated position without assistance, normal stance,  moderate stride, good arm swing, smooth turning, able to perform tiptoe, and heel walking without difficulty. Tandem gait is steady.  No assistive device  DIAGNOSTIC DATA (LABS, IMAGING, TESTING) - I reviewed patient records, labs, notes, testing and imaging myself where  available.  Lab Results  Component Value Date   WBC 9.7 07/22/2017   HGB 12.2 07/22/2017   HCT 37.7 07/22/2017   MCV 86.8 07/22/2017   PLT 371.0 07/22/2017      Component Value Date/Time   NA 140 07/22/2017 0927   K 4.2 07/22/2017 0927   CL 101 07/22/2017 0927   CO2 31 07/22/2017 0927   GLUCOSE 108 (H) 07/22/2017 0927   GLUCOSE 96 08/20/2006 1433   BUN  11 07/22/2017 0927   CREATININE 0.85 07/22/2017 0927   CALCIUM 9.8 07/22/2017 0927   PROT 7.5 07/22/2017 0927   ALBUMIN 4.2 07/22/2017 0927   AST 15 07/22/2017 0927   ALT 14 07/22/2017 0927   ALKPHOS 91 07/22/2017 0927   BILITOT 0.7 07/22/2017 0927   GFRNONAA >60 02/08/2017 1415   GFRAA >60 02/08/2017 1415   Lab Results  Component Value Date   CHOL 203 (H) 07/22/2017   HDL 78.10 07/22/2017   LDLCALC 117 (H) 07/22/2017   LDLDIRECT 141.1 10/01/2013   TRIG 43.0 07/22/2017   CHOLHDL 3 07/22/2017   Lab Results  Component Value Date   HGBA1C 5.6 07/22/2017   Lab Results  Component Value Date   VITAMINB12 419 07/22/2017   Lab Results  Component Value Date   TSH 1.48 07/22/2017      ASSESSMENT AND PLAN 67 y.o. year old female  has a past medical history of Arthritis (shoulders and back); Trigeminal neuralgia with  [facility]  Sept 2017, mild cognitive impairment, tremor  and  right-sided facial pain. Her mild cognitive impairment more likely due to polypharmacy treatment laboratory evaluation no treatable etiology found. MRI of the brain 01/04/17  small vessel disease  PLAN:   Continue gabapentin 695m 2 tabs in the morning and 3 at lunch and 1to 2 at  night  Does not need refills Continue Lamictal  1081mtwice daily Continue  Oxtellar  2 tablets at bedtime or 60019mFollow-up 6 months I spent 15 in total face to face time with the patient more than 50% of which was spent counseling and coordination of care, reviewing test results reviewing medications and discussing and reviewing the diagnosis of trigeminal  neuralgia and further treatment options. , NanRayburn MaC,[facility] Saint Elizabeth HospitalPRN  [facility] [id number] [place] [id number]

## 2017-10-10 ENCOUNTER — Encounter: Payer: Self-pay | Admitting: Nurse Practitioner

## 2017-10-10 ENCOUNTER — Ambulatory Visit: Payer: Federal, State, Local not specified - PPO | Admitting: Nurse Practitioner

## 2017-10-10 VITALS — BP 133/82 | HR 89 | Ht 66.0 in | Wt 217.4 lb

## 2017-10-10 DIAGNOSIS — G500 Trigeminal neuralgia: Secondary | ICD-10-CM | POA: Diagnosis not present

## 2017-10-10 MED ORDER — OXCARBAZEPINE ER 300 MG PO TB24: 600.0000 mg | ORAL_TABLET | Freq: Every day | ORAL | 6 refills | Status: DC

## 2017-10-10 NOTE — Progress Notes (Signed)
I have reviewed and agreed above plan. 

## 2017-10-10 NOTE — Patient Instructions (Signed)
Continue gabapentin 600mg  2 tabs in the morning and 3 at lunch and 1to 2 at  night   Continue Lamictal  100mg  twice daily Continue  Oxtellar  2 tablets at bedtime or 600mg   Follow-up 6 months

## 2017-10-15 ENCOUNTER — Ambulatory Visit: Payer: Federal, State, Local not specified - PPO | Admitting: Internal Medicine

## 2017-10-15 ENCOUNTER — Other Ambulatory Visit (INDEPENDENT_AMBULATORY_CARE_PROVIDER_SITE_OTHER): Payer: Federal, State, Local not specified - PPO

## 2017-10-15 ENCOUNTER — Encounter: Payer: Self-pay | Admitting: Internal Medicine

## 2017-10-15 VITALS — BP 120/70 | HR 72 | Temp 98.8°F | Ht 66.0 in | Wt 217.0 lb

## 2017-10-15 DIAGNOSIS — L299 Pruritus, unspecified: Secondary | ICD-10-CM

## 2017-10-15 LAB — CBC
HCT: 38.4 % (ref 36.0–46.0)
Hemoglobin: 12.2 g/dL (ref 12.0–15.0)
MCHC: 31.9 g/dL (ref 30.0–36.0)
MCV: 85.1 fl (ref 78.0–100.0)
Platelets: 342 10*3/uL (ref 150.0–400.0)
RBC: 4.51 Mil/uL (ref 3.87–5.11)
RDW: 14.7 % (ref 11.5–15.5)
WBC: 8.1 10*3/uL (ref 4.0–10.5)

## 2017-10-15 LAB — COMPREHENSIVE METABOLIC PANEL
ALT: 10 U/L (ref 0–35)
AST: 16 U/L (ref 0–37)
Albumin: 4.1 g/dL (ref 3.5–5.2)
Alkaline Phosphatase: 77 U/L (ref 39–117)
BUN: 10 mg/dL (ref 6–23)
CO2: 33 mEq/L — ABNORMAL HIGH (ref 19–32)
Calcium: 9.3 mg/dL (ref 8.4–10.5)
Chloride: 101 mEq/L (ref 96–112)
Creatinine, Ser: 0.82 mg/dL (ref 0.40–1.20)
GFR: 90.12 mL/min (ref >60.00)
Glucose, Bld: 91 mg/dL (ref 70–99)
Potassium: 3.5 mEq/L (ref 3.5–5.1)
Sodium: 141 mEq/L (ref 135–145)
Total Bilirubin: 0.4 mg/dL (ref 0.2–1.2)
Total Protein: 7.4 g/dL (ref 6.0–8.3)

## 2017-10-15 LAB — VITAMIN D 25 HYDROXY (VIT D DEFICIENCY, FRACTURES): VITD: 25.88 ng/mL — ABNORMAL LOW (ref 30.00–100.00)

## 2017-10-15 MED ORDER — HYDROXYZINE HCL 10 MG PO TABS: 10.0000 mg | ORAL_TABLET | Freq: Three times a day (TID) | ORAL | 0 refills | Status: DC | PRN

## 2017-10-15 NOTE — Progress Notes (Signed)
   Subjective:    Patient ID: Summer [name], female    DOB: June 18, 1951, 67 y.o.   MRN: [id number]  HPI The patient is a 67 YO female coming in for itching on her trunk and arms. Denies itching on her hands or feet. She denies itching on her face or mouth swelling or problems swallowing. She did change soap a couple of weeks ago before this started. She has had this for several weeks and it is same as when she started. She is trying not to scratch. No pain unless she scratches then it burns. She has no rash unless she itches some. She tried benadryl which did not help the itching. Has not tried any creams. She denies other changes. No new foods. No new medications or change in dosing.   Review of Systems  Constitutional: Negative.   Respiratory: Negative for cough, chest tightness and shortness of breath.   Cardiovascular: Negative for chest pain, palpitations and leg swelling.  Gastrointestinal: Negative for abdominal distention, abdominal pain, constipation, diarrhea, nausea and vomiting.  Musculoskeletal: Negative.   Skin: Negative.        itching  Neurological: Negative.   Psychiatric/Behavioral: Negative.       Objective:   Physical Exam  Constitutional: She is oriented to person, place, and time. She appears well-developed and well-nourished.  HENT:  Head: Normocephalic and atraumatic.  Eyes: EOM are normal.  Neck: Normal range of motion.  Cardiovascular: Normal rate and regular rhythm.  Pulmonary/Chest: Effort normal and breath sounds normal. No respiratory distress. She has no wheezes. She has no rales.  Abdominal: Soft. Bowel sounds are normal. She exhibits no distension. There is no tenderness. There is no rebound.  Musculoskeletal: She exhibits no edema.  Neurological: She is alert and oriented to person, place, and time. Coordination normal.  Skin: Skin is warm and dry.  No rash, she shows me where it is itching and the skin looks normal without thickening, color change.    Psychiatric: She has a normal mood and affect.   Vitals:   10/15/17 1527  BP: 120/70  Pulse: 72  Temp: 98.8 F (37.1 C)  TempSrc: Oral  SpO2: 99%  Weight: 217 lb (98.4 kg)  Height: 5\' 6"  (1.676 m)      Assessment & Plan:

## 2017-10-15 NOTE — Patient Instructions (Signed)
We will check the blood work today for the itching.   We have sent in hydroxyzine that you can take as needed for the itching. It can make you drowsy some so do not drive after taking until you know how it will affect you.

## 2017-10-16 NOTE — Assessment & Plan Note (Signed)
Checking vitamin D, CBC, CMP for any changes to cause the pruritus. Suspect that it could be the change in soap and have asked her to stop that to see if there is resolution.

## 2017-10-20 ENCOUNTER — Other Ambulatory Visit: Payer: Self-pay | Admitting: Internal Medicine

## 2017-10-28 ENCOUNTER — Encounter (HOSPITAL_COMMUNITY): Payer: Self-pay | Admitting: Emergency Medicine

## 2017-10-28 ENCOUNTER — Ambulatory Visit (HOSPITAL_COMMUNITY)
Admission: EM | Admit: 2017-10-28 | Discharge: 2017-10-28 | Disposition: A | Discharge status: Home / Self Care | Payer: Federal, State, Local not specified - PPO | Attending: Family Medicine | Admitting: Family Medicine

## 2017-10-28 DIAGNOSIS — L299 Pruritus, unspecified: Secondary | ICD-10-CM | POA: Diagnosis not present

## 2017-10-28 LAB — CBC WITH DIFFERENTIAL/PLATELET
Basophils Absolute: 0.1 10*3/uL (ref 0.0–0.1)
Basophils Relative: 1 %
Eosinophils Absolute: 0.5 10*3/uL (ref 0.0–0.7)
Eosinophils Relative: 7 %
HCT: 38.8 % (ref 36.0–46.0)
Hemoglobin: 12.1 g/dL (ref 12.0–15.0)
Lymphocytes Relative: 36 %
Lymphs Abs: 2.7 10*3/uL (ref 0.7–4.0)
MCH: 27.3 pg (ref 26.0–34.0)
MCHC: 31.2 g/dL (ref 30.0–36.0)
MCV: 87.4 fL (ref 78.0–100.0)
Monocytes Absolute: 0.5 10*3/uL (ref 0.1–1.0)
Monocytes Relative: 7 %
Neutro Abs: 3.7 10*3/uL (ref 1.7–7.7)
Neutrophils Relative %: 49 %
Platelets: 355 10*3/uL (ref 150–400)
RBC: 4.44 MIL/uL (ref 3.87–5.11)
RDW: 15 % (ref 11.5–15.5)
WBC: 7.5 10*3/uL (ref 4.0–10.5)

## 2017-10-28 LAB — COMPREHENSIVE METABOLIC PANEL
ALT: 13 U/L — ABNORMAL LOW (ref 14–54)
AST: 21 U/L (ref 15–41)
Albumin: 3.7 g/dL (ref 3.5–5.0)
Alkaline Phosphatase: 80 U/L (ref 38–126)
Anion gap: 9 (ref 5–15)
BUN: 8 mg/dL (ref 6–20)
CO2: 29 mmol/L (ref 22–32)
Calcium: 9.4 mg/dL (ref 8.9–10.3)
Chloride: 105 mmol/L (ref 101–111)
Creatinine, Ser: 0.94 mg/dL (ref 0.44–1.00)
GFR calc Af Amer: >60 mL/min (ref >60)
GFR calc non Af Amer: >60 mL/min (ref >60)
Glucose, Bld: 105 mg/dL — ABNORMAL HIGH (ref 65–99)
Potassium: 4.2 mmol/L (ref 3.5–5.1)
Sodium: 143 mmol/L (ref 135–145)
Total Bilirubin: 0.4 mg/dL (ref 0.3–1.2)
Total Protein: 7.1 g/dL (ref 6.5–8.1)

## 2017-10-28 LAB — TSH: TSH: 0.728 u[IU]/mL (ref 0.350–4.500)

## 2017-10-28 MED ORDER — PREDNISONE 10 MG PO TABS: ORAL_TABLET | ORAL | 0 refills | Status: DC

## 2017-10-28 NOTE — ED Triage Notes (Signed)
PT reports itching all over and skin is tender to touch. PT denies visible rash. PT has tried benadryl, a cream, and  hemorrhoid cream, cortisone cream. None of them helped.

## 2017-10-28 NOTE — Discharge Instructions (Signed)
Expect improvement in the next 24 hours.  We are running lab tests the results of which should be available tomorrow.  At this point we don't have a good explanation for the itching.  If you are no better tomorrow, call you personal physician for an appointment to discuss further.  It may be a developing allergy to one of your medications.

## 2017-10-28 NOTE — ED Provider Notes (Signed)
[place]   [id number] 10/28/17 Arrival Time: 1201   SUBJECTIVE:  Summer [name] is a 67 y.o. female who presents to the urgent care with complaint of itching all over and skin is tender to touch. PT denies visible rash. PT has tried benadryl, a cream, and  hemorrhoid cream, cortisone cream. None of them helped.   2 weeks of migrating intense itching that began after chair was changed at [employer] where she works.  The patchy itching is associated with hypersensitivity.   Past Medical History:  Diagnosis Date  . Arthritis shoulders and back  . 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 Diverticulosis   . Fibromyalgia   . History of kidney stones   . History of kidney stones   . History of shingles 2012--  no residual pain  . Left ureteral calculus   . MCI (mild cognitive impairment)   . Trigeminal neuralgia RIGHT  . Urgency of urination    Family History  Problem Relation Age of Onset  . Heart disease Maternal Grandmother   . Diabetes Mother   . Breast cancer Maternal Aunt   . Lupus Cousin   . Colon cancer Neg Hx   . Esophageal cancer Neg Hx   . Rectal cancer Neg Hx   . Stomach cancer Neg Hx    Social History   Socioeconomic History  . Marital status: Married    Spouse name: Not on file  . Number of children: 1  . Years of education: College  . Highest education level: Not on file  Social Needs  . Financial resource strain: Not on file  . Food insecurity - worry: Not on file  . Food insecurity - inability: Not on file  . Transportation needs - medical: Not on file  . Transportation needs - non-medical: Not on file  Occupational History    Employer: UNEMPLOYED  . Occupation: HUMAN RESOURCES     Employer: [employer]  Tobacco Use  . Smoking status: Never Smoker  . Smokeless tobacco: Never Used  Substance and Sexual Activity  . Alcohol use: Yes    Comment: 1 EVERY 2 MONTHS  . Drug use: No    . Sexual activity: Not on file  Other Topics Concern  . Not on file  Social History Narrative  . Not on file   No outpatient medications have been marked as taking for the 10/28/17 encounter [facility] Encounter).   Allergies  Allergen Reactions  . Clindamycin Hcl Itching    itch      ROS: As per HPI, remainder of ROS negative.   OBJECTIVE:   Vitals:   10/28/17 1352 10/28/17 1353  BP:  (!) 112/48  Pulse: 72   Resp: 16   Temp: 97.8 F (36.6 C)   TempSrc: Temporal   SpO2: 100%   Weight:  213 lb (96.6 kg)  Height:  5\' 6"  (1.676 m)     General appearance: alert; no distress Eyes: PERRL; EOMI; conjunctiva normal HENT: normocephalic; atraumatic; TMs normal, canal normal, external ears normal without trauma; nasal mucosa normal; oral mucosa normal Neck: supple Lungs: clear to auscultation bilaterally Heart: regular rate and rhythm Back: no CVA tenderness Extremities: no cyanosis or edema; symmetrical with no gross deformities Skin: warm and dry Neurologic: normal gait; grossly normal Psychological: alert and cooperative; normal mood and affect      Labs:  Results for orders placed or performed in visit on  10/15/17  VITAMIN D 25 Hydroxy (Vit-D Deficiency, Fractures)  Result Value Ref Range   VITD 25.88 (L) 30.00 - 100.00 ng/mL  Comprehensive metabolic panel  Result Value Ref Range   Sodium 141 135 - 145 mEq/L   Potassium 3.5 3.5 - 5.1 mEq/L   Chloride 101 96 - 112 mEq/L   CO2 33 (H) 19 - 32 mEq/L   Glucose, Bld 91 70 - 99 mg/dL   BUN 10 6 - 23 mg/dL   Creatinine, Ser 0.82 0.40 - 1.20 mg/dL   Total Bilirubin 0.4 0.2 - 1.2 mg/dL   Alkaline Phosphatase 77 39 - 117 U/L   AST 16 0 - 37 U/L   ALT 10 0 - 35 U/L   Total Protein 7.4 6.0 - 8.3 g/dL   Albumin 4.1 3.5 - 5.2 g/dL   Calcium 9.3 8.4 - 10.5 mg/dL   GFR 90.12 >60.00 mL/min  CBC  Result Value Ref Range   WBC 8.1 4.0 - 10.5 K/uL   RBC 4.51 3.87 - 5.11 Mil/uL   Platelets 342.0 150.0 - 400.0 K/uL    Hemoglobin 12.2 12.0 - 15.0 g/dL   HCT 38.4 36.0 - 46.0 %   MCV 85.1 78.0 - 100.0 fl   MCHC 31.9 30.0 - 36.0 g/dL   RDW 14.7 11.5 - 15.5 %    Labs Reviewed  COMPREHENSIVE METABOLIC PANEL  CBC WITH DIFFERENTIAL/PLATELET  TSH    No results found.     ASSESSMENT & PLAN:  1. Pruritus     Meds ordered this encounter  Medications  . predniSONE (DELTASONE) 10 MG tablet    Sig: Two daily with food    Dispense:  10 tablet    Refill:  0    Reviewed expectations re: course of current medical issues. Questions answered. Outlined signs and symptoms indicating need for more acute intervention. Patient verbalized understanding. After Visit Summary given.    Procedures:      Robyn Haber, MD 10/28/17 [phone]

## 2017-11-18 ENCOUNTER — Encounter: Payer: Self-pay | Admitting: Nurse Practitioner

## 2017-11-18 ENCOUNTER — Encounter: Payer: Self-pay | Admitting: Internal Medicine

## 2017-11-21 ENCOUNTER — Telehealth: Payer: Self-pay | Admitting: *Deleted

## 2017-11-21 NOTE — Telephone Encounter (Signed)
LVM requesting call back re: my chart message about medications.

## 2017-11-22 NOTE — Telephone Encounter (Signed)
Sent reply, instructions to patient  via My Chart yesterday.

## 2017-11-26 ENCOUNTER — Other Ambulatory Visit: Payer: Self-pay | Admitting: *Deleted

## 2017-11-26 MED ORDER — GABAPENTIN 600 MG PO TABS: ORAL_TABLET | ORAL | 1 refills | Status: DC

## 2017-11-29 ENCOUNTER — Encounter: Payer: Self-pay | Admitting: Nurse Practitioner

## 2017-11-30 ENCOUNTER — Other Ambulatory Visit: Payer: Self-pay | Admitting: Neurology

## 2017-12-02 NOTE — Telephone Encounter (Signed)
LVM informing patient this RN replied to her my chart where she stated [facility] had not received Gabapentin refill. This RN advised her (as in my chart message sent to her last week) that the refill was e scribed on 11/26/17 with receipt confirmed by pharmacy. Also informed her this RN did insurance approval which [facility] requested, no action required per cover my meds. Advised she call [facility] and let this RN know if she has any further problems.

## 2017-12-02 NOTE — Telephone Encounter (Signed)
Called [facility], spoke with Shiny and inquired if they received Rx refill for gabapentin. She stated insurance not covering quantity. This RN advised she did PA on CMM and it stated: Your PA has been resolved, no additional PA is required. Shiny ran medication through insurance and stated it is saying quantity not covered, must call [phone].   Called [facility], spoke with Clarise Cruz. Information submitted, reviewed by clinical pharmacist. Medication denied, Clarise Cruz stated this office will receive notification and how to appeal; patient will receive a letter.    Will call patient to advise.

## 2017-12-17 ENCOUNTER — Encounter: Payer: Self-pay | Admitting: Nurse Practitioner

## 2017-12-20 NOTE — Telephone Encounter (Signed)
Unable to reach patient on phone; left detailed VM. Messaged her on my chart that per Dr Krista Blue she is to increase Oxetellar to 3 tabs every night. If this does not work the [employer] can be increased. Left office number on VM and advised there is a dr on call on weekends. Gave office number in my chart message as well.

## 2018-01-08 ENCOUNTER — Telehealth: Payer: Self-pay | Admitting: Nurse Practitioner

## 2018-01-08 NOTE — Telephone Encounter (Signed)
Received note from Dr. Salomon Fick at [facility], patient had gamma knife procedure on follow-up appointment pain somewhat better but still with prominent triggers cold air brushing teeth.  Patient wants to repeat the procedure in the hope of  consolidating the effectiveness of gamma knife

## 2018-01-13 NOTE — Progress Notes (Signed)
Office Visit Note  Patient: Summer [name]             Date of Birth: 1951/01/19           MRN: [id number]             PCP: Hoyt Koch, MD Referring: Hoyt Koch, * Visit Date: 01/14/2018 Occupation: @GUAROCC @    Subjective:  Left knee pain   History of Present Illness: Summer [name] is a 67 y.o. female with history of fibromyalgia, DDD, and osteoarthritis.  Patient takes gabapentin daily.  She denies any recent fibromyalgia flares.  She states that she does have generalized muscle tenderness at this time.  She she states she has lower back pain at times.  She states that her neck is been doing well.  Her main concern today is her left knee pain and left trochanteric bursitis.  She reports mild swelling and stiffness in her left knee.  She states that her right knee has been doing well.  She denies any other joint pain or joint swelling at this time.    Activities of Daily Living:  Patient reports morning stiffness for 5 minutes.   Patient Reports nocturnal pain.  Difficulty dressing/grooming: Denies Difficulty climbing stairs: Reports Difficulty getting out of chair: Reports Difficulty using hands for taps, buttons, cutlery, and/or writing: Reports   Review of Systems  Constitutional: Positive for fatigue.  HENT: Negative for mouth sores, trouble swallowing, trouble swallowing, mouth dryness and nose dryness.   Eyes: Positive for dryness. Negative for pain and visual disturbance.  Respiratory: Negative for cough, hemoptysis, shortness of breath and difficulty breathing.   Cardiovascular: Negative for chest pain, palpitations, hypertension and swelling in legs/feet.  Gastrointestinal: Positive for constipation. Negative for blood in stool and diarrhea.  Endocrine: Negative for increased urination.  Genitourinary: Negative for painful urination.  Musculoskeletal: Positive for arthralgias, joint pain, joint swelling, morning stiffness and muscle  tenderness. Negative for myalgias, muscle weakness and myalgias.  Skin: Negative for color change, pallor, rash, hair loss, nodules/bumps, skin tightness, ulcers and sensitivity to sunlight.  Allergic/Immunologic: Negative for susceptible to infections.  Neurological: Negative for dizziness, numbness, headaches and weakness.  Hematological: Negative for swollen glands.  Psychiatric/Behavioral: Negative for depressed mood and sleep disturbance. The patient is not nervous/anxious.     PMFS History:  Patient Active Problem List   Diagnosis Date Noted  . Weight loss 07/22/2017  . Memory loss 12/21/2016  . Tremor 12/21/2016  . Right sided temporal headache 12/21/2016  . Trochanteric bursitis, right hip 10/26/2016  . Pruritus 10/26/2016  . Foot sprain, left, initial encounter 08/31/2016  . Pain of right side of body 08/31/2016  . Headache 04/24/2016  . LLQ pain 04/24/2016  . Glossitis 02/02/2016  . Routine general medical examination at a health care facility 10/27/2015  . Facial pain 10/06/2015  . Trigeminal neuralgia of right side of face 11/30/2010  . Morbid obesity ([place]) 11/09/2010  . Fibromyalgia 11/09/2010  . HEMATURIA, HX OF 11/14/2009  . LOC OSTEOARTHROS NOT SPEC PRIM/SEC OTH [facility] SITE 06/27/2009  . Allergic rhinitis 08/26/2008  . 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 Arthritis shoulders and back  . 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 Diverticulosis   . Fibromyalgia   . History of kidney stones   . History of kidney stones   . History of shingles 2012--  no residual pain  . Left ureteral calculus   . MCI (mild cognitive impairment)   . Trigeminal neuralgia RIGHT  . Urgency of urination     Family History  Problem Relation Age of Onset  . Heart disease Maternal Grandmother   . Diabetes Mother   . Breast cancer Maternal Aunt   . Lupus Cousin   . Colon  cancer Neg Hx   . Esophageal cancer Neg Hx   . Rectal cancer Neg Hx   . Stomach cancer Neg Hx    Past Surgical History:  Procedure Laterality Date  . CEREBRAL MICROVASCULAR DECOMPRESSION  07-05-2006   RIGHT TRIGEMINAL NERVE  . CYSTOSCOPY/RETROGRADE/URETEROSCOPY/STONE EXTRACTION WITH BASKET  07/04/2012   Procedure: CYSTOSCOPY/RETROGRADE/URETEROSCOPY/STONE EXTRACTION WITH BASKET;  Surgeon: Claybon Jabs, MD;  Location: [facility];  Service: Urology;  Laterality: Left;  . EXPLORATORY LAPAROTOMY/ RESECTION MID TO DISTAL SIGMOID AND PROXIMAL RECTUM/ END PROXIMAL SIGMOID COLOSTOMY  07-17-2006   PERFORATED DIVERTICULITIS WITH PERITONITIS  . gamma knife  05/17/2016   for trigeminal neuralgia, [facility], Dr Salomon Fick  . RESECTION COLOSTOMY/ CLOSURE COLOSTOMY WITH COLOPROCTOSTOMY  02-20-2007  . TONSILLECTOMY  AS CHILD  . URETEROSCOPY  07/04/2012   Procedure: URETEROSCOPY;  Surgeon: Claybon Jabs, MD;  Location: [facility];  Service: Urology;  Laterality: Left;  Marland Kitchen VAGINAL HYSTERECTOMY  1998   Partial  . WISDOM TOOTH EXTRACTION     Social History   Social History Narrative  . Not on file     Objective: Vital Signs: BP 107/60 (BP Location: Left Arm, Patient Position: Sitting, Cuff Size: Normal)   Pulse 72   Resp 14   Ht 5\' 6"  (1.676 m)   Wt 209 lb (94.8 kg)   BMI 33.73 kg/m    Physical Exam  Constitutional: She is oriented to person, place, and time. She appears well-developed and well-nourished.  HENT:  Head: Normocephalic and atraumatic.  Eyes: Conjunctivae and EOM are normal.  Neck: Normal range of motion.  Cardiovascular: Normal rate, regular rhythm, normal heart sounds and intact distal pulses.  Pulmonary/Chest: Effort normal and breath sounds normal.  Abdominal: Soft. Bowel sounds are normal.  Lymphadenopathy:    She has no cervical adenopathy.  Neurological: She is alert and oriented to person, place, and time.  Skin: Skin is warm and dry.  Capillary refill takes less than 2 seconds.  Psychiatric: She has a normal mood and affect. Her behavior is normal.  Nursing note and vitals reviewed.    Musculoskeletal Exam: C-spine limited range of motion with no discomfort.  Thoracic and lumbar spine good range of motion.  No midline spinal tenderness.  No SI joint tenderness.  Shoulder joints limited range of motion due to discomfort.  I will joints, wrist joints, MCPs, PIPs, DIPs good range of motion with no synovitis.  Hip joints good range of motion.  Limited left knee extension with discomfort.  She has left knee warmth and crepitus bilaterally.  Ankle joints, MTPs, PIPs, DIPs good range of motion with no synovitis.  She has left trochanteric bursa tenderness.  CDAI Exam: No CDAI exam completed.    Investigation: No additional findings. CBC Latest Ref Rng & Units 10/28/2017 10/15/2017 07/22/2017  WBC 4.0 - 10.5 K/uL 7.5 8.1 9.7  Hemoglobin 12.0 - 15.0 g/dL 12.1 12.2 12.2  Hematocrit 36.0 - 46.0 % 38.8 38.4 37.7  Platelets 150 - 400 K/uL 355 342.0 371.0   CMP Latest Ref Rng & Units 10/28/2017 10/15/2017 07/22/2017  Glucose  65 - 99 mg/dL 105(H) 91 108(H)  BUN 6 - 20 mg/dL 8 10 11   Creatinine 0.44 - 1.00 mg/dL 0.94 0.82 0.85  Sodium 135 - 145 mmol/L 143 141 140  Potassium 3.5 - 5.1 mmol/L 4.2 3.5 4.2  Chloride 101 - 111 mmol/L 105 101 101  CO2 22 - 32 mmol/L 29 33(H) 31  Calcium 8.9 - 10.3 mg/dL 9.4 9.3 9.8  Total Protein 6.5 - 8.1 g/dL 7.1 7.4 7.5  Total Bilirubin 0.3 - 1.2 mg/dL 0.4 0.4 0.7  Alkaline Phos 38 - 126 U/L 80 77 91  AST 15 - 41 U/L 21 16 15   ALT 14 - 54 U/L 13(L) 10 14    Imaging: No results found.  Speciality Comments: No specialty comments available.    Procedures:  Large Joint Inj: L knee on 01/14/2018 10:16 AM Indications: pain Details: 27 G 1.5 in needle, medial approach  Arthrogram: No  Medications: 1.5 mL lidocaine 1 %; 40 mg triamcinolone acetonide 40 MG/ML Aspirate: 0 mL Outcome: tolerated well,  no immediate complications Procedure, treatment alternatives, risks and benefits explained, specific risks discussed. Consent was given by the patient. Immediately prior to procedure a time out was called to verify the correct patient, procedure, equipment, support staff and site/side marked as required. Patient was prepped and draped in the usual sterile fashion.   Large Joint Inj: L greater trochanter on 01/14/2018 10:17 AM Indications: pain Details: 27 G 1.5 in needle, lateral approach  Arthrogram: No  Medications: 1 mL lidocaine 1 %; 40 mg triamcinolone acetonide 40 MG/ML Aspirate: 0 mL Outcome: tolerated well, no immediate complications Procedure, treatment alternatives, risks and benefits explained, specific risks discussed. Consent was given by the patient. Immediately prior to procedure a time out was called to verify the correct patient, procedure, equipment, support staff and site/side marked as required. Patient was prepped and draped in the usual sterile fashion.     Allergies: Clindamycin hcl   Assessment / Plan:     Visit Diagnoses: Fibromyalgia: She continues to have generalized muscle tenderness.  She takes gabapentin on a daily basis.  She was advised to continue to exercise on a regular basis.  She has left trochanteric bursitis which she was given exercises that she can perform at home.  She has had a cortisone injection of her left knee and left trochanter bursa today in the office.  DDD (degenerative disc disease), cervical: Limited range of motion on exam.  She has no discomfort in her neck at this time.  No symptoms of radiculopathy.  DDD (degenerative disc disease), thoracic: No midline spinal tenderness.  DDD (degenerative disc disease), lumbar: No midline spinal tenderness.  She is occasional discomfort in her lower back.  Primary osteoarthritis of both knees: She has bilateral knee crepitus.  She has left knee warmth.  She is discomfort in her left knee at this  time.  Her right knee is been doing well.  She had a left knee cortisone injection performed in the office today.  Chronic pain of left knee: She has warmth of her left knee on exam.  She has discomfort with limited extension.  She did cortisone injection on 07/08/2017.  She requested a cortisone injection today in the office.  She has difficulty climbing steps and getting up from a chair due to her knee pain.  She is advised to monitor her blood pressure closely following the cortisone injection today.  She tolerated the procedure well.  Trochanteric bursitis, left hip: She has  tenderness of the left trochanteric bursa exam.  She had a cortisone injection on 07/08/2017 which relieved her symptoms for many months.  She requested a cortisone injection today in the office.  We discussed the potential side effects.  She is advised to monitor her blood pressure closely following the cortisone injections today.  She tolerated the procedure well.  She was given a handout of exercises that she can perform at home.  Other medical conditions are listed as follows:  History of insomnia  History of diverticulitis -  H/O rupture and repair  History of hematuria  History of trigeminal neuralgia     Orders: Orders Placed This Encounter  Procedures  . Large Joint Inj  . Large Joint Inj   No orders of the defined types were placed in this encounter.   Face-to-face time spent with patient was 30 minutes. >50% of time was spent in counseling and coordination of care.  Follow-Up Instructions: Return in about 6 months (around 07/17/2018) for Fibromyalgia, Osteoarthritis, DDD.   Ofilia Neas, PA-C   I examined and evaluated the patient with Hazel Sams PA.  Patient have a lot of tenderness on palpation of her left trochanteric bursa consistent with trochanteric bursitis on my exam.  She has been also having discomfort in her left knee where she has osteoarthritis.  Per her request both areas were  injected with cortisone as described above.. The plan of care was discussed as noted above.  Bo Merino, MD  Note - This record has been created using Editor, commissioning.  Chart creation errors have been sought, but may not always  have been located. Such creation errors do not reflect on  the standard of medical care.

## 2018-01-14 ENCOUNTER — Ambulatory Visit: Payer: Federal, State, Local not specified - PPO | Admitting: Rheumatology

## 2018-01-14 ENCOUNTER — Encounter: Payer: Self-pay | Admitting: Rheumatology

## 2018-01-14 VITALS — BP 107/60 | HR 72 | Resp 14 | Ht 66.0 in | Wt 209.0 lb

## 2018-01-14 DIAGNOSIS — M5030 Other cervical disc degeneration, unspecified cervical region: Secondary | ICD-10-CM | POA: Diagnosis not present

## 2018-01-14 DIAGNOSIS — M7062 Trochanteric bursitis, left hip: Secondary | ICD-10-CM | POA: Diagnosis not present

## 2018-01-14 DIAGNOSIS — Z87448 Personal history of other diseases of urinary system: Secondary | ICD-10-CM | POA: Diagnosis not present

## 2018-01-14 DIAGNOSIS — G8929 Other chronic pain: Secondary | ICD-10-CM | POA: Diagnosis not present

## 2018-01-14 DIAGNOSIS — Z8669 Personal history of other diseases of the nervous system and sense organs: Secondary | ICD-10-CM

## 2018-01-14 DIAGNOSIS — M170 Bilateral primary osteoarthritis of knee: Secondary | ICD-10-CM

## 2018-01-14 DIAGNOSIS — Z87898 Personal history of other specified conditions: Secondary | ICD-10-CM

## 2018-01-14 DIAGNOSIS — M25562 Pain in left knee: Secondary | ICD-10-CM

## 2018-01-14 DIAGNOSIS — Z8719 Personal history of other diseases of the digestive system: Secondary | ICD-10-CM | POA: Diagnosis not present

## 2018-01-14 DIAGNOSIS — M797 Fibromyalgia: Secondary | ICD-10-CM | POA: Diagnosis not present

## 2018-01-14 DIAGNOSIS — M5136 Other intervertebral disc degeneration, lumbar region: Secondary | ICD-10-CM

## 2018-01-14 DIAGNOSIS — M5134 Other intervertebral disc degeneration, thoracic region: Secondary | ICD-10-CM

## 2018-01-14 MED ORDER — LIDOCAINE HCL 1 % IJ SOLN
1.0000 mL | INTRAMUSCULAR | Status: AC | PRN
  Administered 2018-01-14: 1 mL

## 2018-01-14 MED ORDER — TRIAMCINOLONE ACETONIDE 40 MG/ML IJ SUSP
40.0000 mg | INTRAMUSCULAR | Status: AC | PRN
  Administered 2018-01-14: 40 mg via INTRA_ARTICULAR

## 2018-01-14 MED ORDER — LIDOCAINE HCL 1 % IJ SOLN
1.5000 mL | INTRAMUSCULAR | Status: AC | PRN
  Administered 2018-01-14: 1.5 mL

## 2018-01-14 NOTE — Patient Instructions (Signed)

## 2018-02-21 ENCOUNTER — Encounter: Payer: Self-pay | Admitting: Internal Medicine

## 2018-02-21 ENCOUNTER — Ambulatory Visit: Payer: Federal, State, Local not specified - PPO | Admitting: Internal Medicine

## 2018-02-21 VITALS — BP 122/60 | HR 70 | Temp 98.6°F | Ht 66.0 in | Wt 202.0 lb

## 2018-02-21 DIAGNOSIS — R51 Headache: Secondary | ICD-10-CM

## 2018-02-21 DIAGNOSIS — R519 Headache, unspecified: Secondary | ICD-10-CM

## 2018-02-21 MED ORDER — PREDNISONE 50 MG PO TABS: 50.0000 mg | ORAL_TABLET | Freq: Every day | ORAL | 0 refills | Status: DC

## 2018-02-21 NOTE — Patient Instructions (Signed)
We have sent in prednisone to take to calm down the pain. Take 1 pill daily for 1 week.

## 2018-02-22 NOTE — Assessment & Plan Note (Signed)
Rx for prednisone to see if this can help her recent flare of pain in the last 2 months. She will think about going through with gamma knife procedure versus postponement. I have asked her to talk to her neurologist as there is no logical reason to change her medical regimen as it is helping her pain significantly. Provided reassurance during the visit and answered many questions about course, procedure.

## 2018-02-22 NOTE — Progress Notes (Signed)
   Subjective:    Patient ID: Summer [name], female    DOB: 07-15-1951, 67 y.o.   MRN: [id number]  HPI The patient is a 67 YO female coming in for subacute worsening of her temporal pain. She has been diagnosed with trigeminal neuralgia in the past and has had gamma knife procedure in the past. She is scheduled with neurosurgery to have another next week but she is apprehensive about it. She has been taking medications from neurology which had been controlling her pain well. She feels that her neurologist wants to take her off the medications and is forcing her to get this procedure done or they will take away her medicines. She is taking them for pain and has not had significant changes to doses recently. Hurts with chewing and pain 10/10 at maximum. With meds sometimes can get pain down to 3/10.   Review of Systems  Constitutional: Positive for activity change and appetite change. Negative for chills, fatigue, fever and unexpected weight change.  HENT: Negative.   Eyes: Negative.   Respiratory: Negative.  Negative for cough, chest tightness and shortness of breath.   Cardiovascular: Negative.  Negative for chest pain, palpitations and leg swelling.  Gastrointestinal: Negative.  Negative for abdominal distention, abdominal pain, constipation, diarrhea, nausea and vomiting.  Musculoskeletal: Positive for myalgias. Negative for arthralgias, back pain, gait problem and joint swelling.  Skin: Negative.   Neurological: Positive for weakness, numbness and headaches. Negative for dizziness and facial asymmetry.  Psychiatric/Behavioral: Negative.       Objective:   Physical Exam  Constitutional: She is oriented to person, place, and time. She appears well-developed and well-nourished.  HENT:  Head: Normocephalic and atraumatic.  Eyes: EOM are normal.  Neck: Normal range of motion.  Cardiovascular: Normal rate and regular rhythm.  Pulmonary/Chest: Effort normal and breath sounds normal. No  respiratory distress. She has no wheezes. She has no rales.  Abdominal: Soft. Bowel sounds are normal. She exhibits no distension. There is no tenderness. There is no rebound.  Musculoskeletal: She exhibits no edema.  Neurological: She is alert and oriented to person, place, and time. Coordination normal.  Sensitivity to touch on the right temporal region  Skin: Skin is warm and dry.  Psychiatric: She has a normal mood and affect.   Vitals:   02/21/18 1605  BP: 122/60  Pulse: 70  Temp: 98.6 F (37 C)  TempSrc: Oral  SpO2: 98%  Weight: 202 lb (91.6 kg)  Height: 5\' 6"  (1.676 m)      Assessment & Plan:  Visit time 25 minutes: greater than 50% of that time was spent in face to face counseling and coordination of care with the patient: counseling about risk and benefits of gamma knife and unpredictable nature of trigeminal neuralgia course and benefit.

## 2018-03-26 ENCOUNTER — Encounter: Payer: Self-pay | Admitting: Physician Assistant

## 2018-03-26 ENCOUNTER — Ambulatory Visit (INDEPENDENT_AMBULATORY_CARE_PROVIDER_SITE_OTHER): Payer: Federal, State, Local not specified - PPO | Admitting: Physician Assistant

## 2018-03-26 VITALS — BP 135/71 | HR 83 | Resp 15 | Ht 66.0 in | Wt 200.0 lb

## 2018-03-26 DIAGNOSIS — Z8719 Personal history of other diseases of the digestive system: Secondary | ICD-10-CM | POA: Diagnosis not present

## 2018-03-26 DIAGNOSIS — Z87898 Personal history of other specified conditions: Secondary | ICD-10-CM

## 2018-03-26 DIAGNOSIS — Z8669 Personal history of other diseases of the nervous system and sense organs: Secondary | ICD-10-CM

## 2018-03-26 DIAGNOSIS — M7062 Trochanteric bursitis, left hip: Secondary | ICD-10-CM | POA: Diagnosis not present

## 2018-03-26 DIAGNOSIS — M5134 Other intervertebral disc degeneration, thoracic region: Secondary | ICD-10-CM

## 2018-03-26 DIAGNOSIS — M5136 Other intervertebral disc degeneration, lumbar region: Secondary | ICD-10-CM | POA: Diagnosis not present

## 2018-03-26 DIAGNOSIS — M5030 Other cervical disc degeneration, unspecified cervical region: Secondary | ICD-10-CM | POA: Diagnosis not present

## 2018-03-26 DIAGNOSIS — M170 Bilateral primary osteoarthritis of knee: Secondary | ICD-10-CM | POA: Diagnosis not present

## 2018-03-26 DIAGNOSIS — M797 Fibromyalgia: Secondary | ICD-10-CM | POA: Diagnosis not present

## 2018-03-26 MED ORDER — DICLOFENAC SODIUM 1 % TD GEL: TRANSDERMAL | 3 refills | Status: DC

## 2018-03-26 NOTE — Patient Instructions (Signed)
Knee Effusion Knee effusion means that you have extra fluid in your knee. This can cause pain. Your knee may be more difficult to bend and move. Follow these instructions at home:  Use crutches as told by your doctor.  Wear a knee brace as told by your doctor.  Apply ice to the swollen area: ? Put ice in a plastic bag. ? Place a towel between your skin and the bag. ? Leave the ice on for 20 minutes, 2-3 times per day.  Keep your knee raised (elevated) when you are sitting or lying down.  Take medicines only as told by your doctor.  Do any rehabilitation or strengthening exercises as told by your doctor.  Rest your knee as told by your doctor. You may start doing your normal activities again when your doctor says it is okay.  Keep all follow-up visits as told by your doctor. This is important. Contact a doctor if:  You continue to have pain in your knee. Get help right away if:  You have increased swelling or redness of your knee.  You have severe pain in your knee.  You have a fever. This information is not intended to replace advice given to you by your health care provider. Make sure you discuss any questions you have with your health care provider. Document Released: 09/29/2010 Document Revised: 02/02/2016 Document Reviewed: 04/12/2014 Elsevier Interactive Patient Education  2018 [employer].

## 2018-03-26 NOTE — Progress Notes (Signed)
Office Visit Note  Patient: Summer [name]             Date of Birth: 1950-12-20           MRN: [id number]             PCP: Hoyt Koch, MD Referring: Hoyt Koch, * Visit Date: 03/26/2018 Occupation: @GUAROCC @  Subjective:  Left knee pain   History of Present Illness: Summer [name] is a 67 y.o. female with history of fibromyalgia, osteoarthritis, and DDD.  Patient presents today with left knee pain for the past 2 days.  She has chronic pain in bilateral knee joints but the left knee has been worse.  She states that the pain is very severe and she was having to limp.  She is also wearing an Ace bandage which was helping with the discomfort but she noticed left knee swelling and increased stiffness.  She states that the pain is more severe she is walking or bending her knee.  She states she has not tried ice or elevation.  She had a cortisone injection on 01/14/2018 to the left knee joint.  She reports she also has as discomfort in her right hand but denies any joint swelling.  She states her right hand will get very cold at times but denies any color changes.  She states her lower back and neck are doing well overall but occasionally will become stiff.  She continues to have trochanteric bursitis bilaterally worse on the left side.  She had a cortisone injection on 01/14/2018 which provided some relief.  She continues to have chronic fatigue and insomnia.  She has intermittent muscle aches and muscle tenderness due to fibromyalgia as well.  Activities of Daily Living:  Patient reports morning stiffness for 5-10  minutes.   Patient Reports nocturnal pain.  Difficulty dressing/grooming: Denies Difficulty climbing stairs: Reports Difficulty getting out of chair: Reports Difficulty using hands for taps, buttons, cutlery, and/or writing: Reports  Review of Systems  Constitutional: Positive for fatigue.  HENT: Positive for mouth dryness. Negative for mouth sores and nose  dryness.   Eyes: Positive for dryness. Negative for pain and visual disturbance.  Respiratory: Negative for cough, hemoptysis, shortness of breath and difficulty breathing.   Cardiovascular: Negative for chest pain, palpitations, hypertension and swelling in legs/feet.  Gastrointestinal: Positive for constipation. Negative for blood in stool and diarrhea.  Endocrine: Negative for increased urination.  Genitourinary: Negative for painful urination.  Musculoskeletal: Positive for arthralgias, joint pain, joint swelling, myalgias, morning stiffness, muscle tenderness and myalgias. Negative for muscle weakness.  Skin: Negative for color change, pallor, rash, hair loss, nodules/bumps, skin tightness, ulcers and sensitivity to sunlight.  Allergic/Immunologic: Negative for susceptible to infections.  Neurological: Negative for dizziness, numbness, headaches and weakness.  Hematological: Negative for swollen glands.  Psychiatric/Behavioral: Positive for sleep disturbance. Negative for depressed mood. The patient is not nervous/anxious.     PMFS History:  Patient Active Problem List   Diagnosis Date Noted  . Weight loss 07/22/2017  . Memory loss 12/21/2016  . Tremor 12/21/2016  . Right sided temporal headache 12/21/2016  . Trochanteric bursitis, right hip 10/26/2016  . Pruritus 10/26/2016  . Foot sprain, left, initial encounter 08/31/2016  . Pain of right side of body 08/31/2016  . Headache 04/24/2016  . LLQ pain 04/24/2016  . Glossitis 02/02/2016  . Routine general medical examination at a health care facility 10/27/2015  . Facial pain 10/06/2015  . Trigeminal neuralgia of right  side of face 11/30/2010  . Morbid obesity ([place]) 11/09/2010  . Fibromyalgia 11/09/2010  . HEMATURIA, HX OF 11/14/2009  . LOC OSTEOARTHROS NOT SPEC PRIM/SEC OTH [facility] SITE 06/27/2009  . Allergic rhinitis 08/26/2008  . 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  Arthritis shoulders and back  . 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 Diverticulosis   . Fibromyalgia   . History of kidney stones   . History of kidney stones   . History of shingles 2012--  no residual pain  . Left ureteral calculus   . MCI (mild cognitive impairment)   . Trigeminal neuralgia RIGHT  . Urgency of urination     Family History  Problem Relation Age of Onset  . Heart disease Maternal Grandmother   . Diabetes Mother   . Breast cancer Maternal Aunt   . Lupus Cousin   . Colon cancer Neg Hx   . Esophageal cancer Neg Hx   . Rectal cancer Neg Hx   . Stomach cancer Neg Hx    Past Surgical History:  Procedure Laterality Date  . CEREBRAL MICROVASCULAR DECOMPRESSION  07-05-2006   RIGHT TRIGEMINAL NERVE  . CYSTOSCOPY/RETROGRADE/URETEROSCOPY/STONE EXTRACTION WITH BASKET  07/04/2012   Procedure: CYSTOSCOPY/RETROGRADE/URETEROSCOPY/STONE EXTRACTION WITH BASKET;  Surgeon: Claybon Jabs, MD;  Location: [facility];  Service: Urology;  Laterality: Left;  . EXPLORATORY LAPAROTOMY/ RESECTION MID TO DISTAL SIGMOID AND PROXIMAL RECTUM/ END PROXIMAL SIGMOID COLOSTOMY  07-17-2006   PERFORATED DIVERTICULITIS WITH PERITONITIS  . gamma knife  05/17/2016   for trigeminal neuralgia, [facility], Dr Salomon Fick  . gamma knife  2019  . RESECTION COLOSTOMY/ CLOSURE COLOSTOMY WITH COLOPROCTOSTOMY  02-20-2007  . TONSILLECTOMY  AS CHILD  . URETEROSCOPY  07/04/2012   Procedure: URETEROSCOPY;  Surgeon: Claybon Jabs, MD;  Location: [facility];  Service: Urology;  Laterality: Left;  Marland Kitchen VAGINAL HYSTERECTOMY  1998   Partial  . WISDOM TOOTH EXTRACTION     Social History   Social History Narrative  . Not on file    Objective: Vital Signs: BP 135/71 (BP Location: Left Arm, Patient Position: Sitting, Cuff Size: Normal)   Pulse 83   Resp 15   Ht 5\' 6"  (1.676 m)   Wt 200 lb (90.7 kg)   BMI 32.28 kg/m    Physical  Exam  Constitutional: She is oriented to person, place, and time. She appears well-developed and well-nourished.  HENT:  Head: Normocephalic and atraumatic.  Eyes: Conjunctivae and EOM are normal.  Neck: Normal range of motion.  Cardiovascular: Normal rate, regular rhythm, normal heart sounds and intact distal pulses.  Pulmonary/Chest: Effort normal and breath sounds normal.  Abdominal: Soft. Bowel sounds are normal.  Lymphadenopathy:    She has no cervical adenopathy.  Neurological: She is alert and oriented to person, place, and time.  Skin: Skin is warm and dry. Capillary refill takes less than 2 seconds.  Psychiatric: She has a normal mood and affect. Her behavior is normal.  Nursing note and vitals reviewed.    Musculoskeletal Exam: C-spine limited range of motion with lateral rotation.  Thoracic lumbar spine good range of motion.  No midline spinal tenderness.  No SI joint tenderness.  Shoulder joints abduction to about 110 degrees bilaterally.  Elbow joints, wrist joints, MCPs, PIPs, DIPs good range of motion no synovitis.  Complete fist formation bilaterally.  Hip joints, ankle  joints, MTPs, PIPs, DIPs good range of motion with no synovitis.  Mild warmth of the left knee joint with limited extension.  Bilateral knee crepitus.  No tenderness on the bilateral ankle joint lines.  Tenderness of bilateral trochanteric bursa.  CDAI Exam: No CDAI exam completed.   Investigation: No additional findings.  Imaging: No results found.  Recent Labs: Lab Results  Component Value Date   WBC 7.5 10/28/2017   HGB 12.1 10/28/2017   PLT 355 10/28/2017   NA 143 10/28/2017   K 4.2 10/28/2017   CL 105 10/28/2017   CO2 29 10/28/2017   GLUCOSE 105 (H) 10/28/2017   BUN 8 10/28/2017   CREATININE 0.94 10/28/2017   BILITOT 0.4 10/28/2017   ALKPHOS 80 10/28/2017   AST 21 10/28/2017   ALT 13 (L) 10/28/2017   PROT 7.1 10/28/2017   ALBUMIN 3.7 10/28/2017   CALCIUM 9.4 10/28/2017   GFRAA >60  10/28/2017    Speciality Comments: No specialty comments available.  Procedures:  No procedures performed Allergies: Clindamycin hcl   Assessment / Plan:     Visit Diagnoses: Fibromyalgia: She continues to have generalized muscle aches and muscle tenderness due to fibromyalgia.  She has chronic fatigue and insomnia.  Patient takes gabapentin 600 mg 2 tablets in the morning 3 at lunch and 1 to 2 tablets at bedtime.  She was encouraged to continue to stay active and perform a exercise on a daily basis.  Good sleep hygiene was discussed.  DDD (degenerative disc disease), cervical: She has limited ROM of the C-spine on exam.  She has chronic pain and stiffness.  DDD (degenerative disc disease), thoracic:  No midline spinal tenderness.  DDD (degenerative disc disease), lumbar:  No midline spinal tenderness.   Primary osteoarthritis of both knees: She has chronic pain in bilateral knee joints.  She has warmth of her left knee on exam today.  She had a cortisone injection performed on 01/14/2018 her left knee which provided temporary relief.  She was requesting cortisone injection today of the left knee we explained the risks.  She would like to proceed with applying for Visco gel injections of bilateral knee joints.  She is also given a prescription for Voltaren gel which she can apply topically 3 times daily to 3 large joints.  Potential side effects were discussed.  Trochanteric bursitis of left hip: She has tenderness on exam today.  She had a cortisone injection on 01/14/18, which provided temporary relief.    Other medical conditions are listed as follows:  History of insomnia  History of diverticulitis  History of trigeminal neuralgia   Orders: No orders of the defined types were placed in this encounter.  Meds ordered this encounter  Medications  . diclofenac sodium (VOLTAREN) 1 % GEL    Sig: Apply 3 g to 3 large joints up to 3 times daily    Dispense:  3 Tube    Refill:  3     Face-to-face time spent with patient was 30 minutes. Greater than 50% of time was spent in counseling and coordination of care.  Follow-Up Instructions: Return in about 6 months (around 09/26/2018) for Fibromyalgia, Osteoarthritis, DDD.   Ofilia Neas, PA-C  Note - This record has been created using Dragon software.  Chart creation errors have been sought, but may not always  have been located. Such creation errors do not reflect on  the standard of medical care.

## 2018-03-28 ENCOUNTER — Encounter: Payer: Self-pay | Admitting: Nurse Practitioner

## 2018-04-08 ENCOUNTER — Encounter: Payer: Self-pay | Admitting: Nurse Practitioner

## 2018-04-09 ENCOUNTER — Ambulatory Visit: Payer: Federal, State, Local not specified - PPO | Admitting: Nurse Practitioner

## 2018-04-16 ENCOUNTER — Telehealth (INDEPENDENT_AMBULATORY_CARE_PROVIDER_SITE_OTHER): Payer: Self-pay

## 2018-04-16 NOTE — Telephone Encounter (Signed)
Submitted for VOB, Synvisc Series, bilateral knee.

## 2018-04-28 ENCOUNTER — Encounter: Payer: Self-pay | Admitting: Nurse Practitioner

## 2018-04-29 ENCOUNTER — Encounter: Payer: Self-pay | Admitting: *Deleted

## 2018-04-29 ENCOUNTER — Telehealth (INDEPENDENT_AMBULATORY_CARE_PROVIDER_SITE_OTHER): Payer: Self-pay

## 2018-04-29 NOTE — Telephone Encounter (Signed)
Please schedule patient appointment with Dr. Estanislado Pandy.  Thank You.  Patient is approved for Synvisc series, bilateral knee. [place] Patient will be responsible for 30% OOP $150.00 Co-pay No PA required

## 2018-05-08 NOTE — Telephone Encounter (Signed)
LMOM for patient to call and schedule Synvisc injections °

## 2018-05-09 ENCOUNTER — Encounter: Payer: Self-pay | Admitting: Nurse Practitioner

## 2018-05-13 ENCOUNTER — Ambulatory Visit (HOSPITAL_COMMUNITY)
Admission: EM | Admit: 2018-05-13 | Discharge: 2018-05-13 | Disposition: A | Discharge status: Home / Self Care | Payer: Federal, State, Local not specified - PPO | Attending: Family Medicine | Admitting: Family Medicine

## 2018-05-13 ENCOUNTER — Encounter (HOSPITAL_COMMUNITY): Payer: Self-pay | Admitting: Emergency Medicine

## 2018-05-13 DIAGNOSIS — M79642 Pain in left hand: Secondary | ICD-10-CM | POA: Diagnosis not present

## 2018-05-13 NOTE — Discharge Instructions (Signed)
Take benadryl for the itching It may cause drowsiness Watch for increased pain and swelling See your doctor in follow up

## 2018-05-13 NOTE — ED Provider Notes (Signed)
[facility]    CSN: [id number] Arrival date & time: 05/13/18  1735     History   Chief Complaint Chief Complaint  Patient presents with  . Hand Pain    HPI Summer [name] is a 67 y.o. female.   HPI  Patient here with mildly vague symptoms.  She worked in her yard yesterday.  She remembers no injury.  Towards the afternoon evening she had some itching in the webspace of the left hand between the index and thumb finger.  When she rubs her finger lightly over the thumb knuckle she feels a sharp sensation as if she has a foreign body.  She did work and Teacher, adult education.  She does not think she had a thorn.  She does not think she had any insect bites.  The itching kept her awake last night.  She has no visible redness or swelling or open wound. 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  She is on Neurontin.  She does not have any symptoms in her tunnel syndrome or overuse injuries in her history.  Past Medical History:  Diagnosis Date  . Arthritis shoulders and back  . 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 Diverticulosis   . Fibromyalgia   . History of kidney stones   . History of kidney stones   . History of shingles 2012--  no residual pain  . Left ureteral calculus   . MCI (mild cognitive impairment)   . Trigeminal neuralgia RIGHT  . Urgency of urination     Patient Active Problem List   Diagnosis Date Noted  . Weight loss 07/22/2017  . Memory loss 12/21/2016  . Tremor 12/21/2016  . Right sided temporal headache 12/21/2016  . Trochanteric bursitis, right hip 10/26/2016  . Pruritus 10/26/2016  . Foot sprain, left, initial encounter 08/31/2016  . Pain of right side of body 08/31/2016  . Headache 04/24/2016  . LLQ pain 04/24/2016  . Glossitis 02/02/2016  . Routine general medical examination at a health care facility 10/27/2015  . Facial pain 10/06/2015  . Trigeminal neuralgia of right side of face 11/30/2010   . Morbid obesity ([place]) 11/09/2010  . Fibromyalgia 11/09/2010  . HEMATURIA, HX OF 11/14/2009  . LOC OSTEOARTHROS NOT SPEC PRIM/SEC OTH [facility] SITE 06/27/2009  . Allergic rhinitis 08/26/2008  . 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 CEREBRAL MICROVASCULAR DECOMPRESSION  07-05-2006   RIGHT TRIGEMINAL NERVE  . CYSTOSCOPY/RETROGRADE/URETEROSCOPY/STONE EXTRACTION WITH BASKET  07/04/2012   Procedure: CYSTOSCOPY/RETROGRADE/URETEROSCOPY/STONE EXTRACTION WITH BASKET;  Surgeon: Claybon Jabs, MD;  Location: [facility];  Service: Urology;  Laterality: Left;  . EXPLORATORY LAPAROTOMY/ RESECTION MID TO DISTAL SIGMOID AND PROXIMAL RECTUM/ END PROXIMAL SIGMOID COLOSTOMY  07-17-2006   PERFORATED DIVERTICULITIS WITH PERITONITIS  . gamma knife  05/17/2016   for trigeminal neuralgia, [facility], Dr Salomon Fick  . gamma knife  2019  . RESECTION COLOSTOMY/ CLOSURE COLOSTOMY WITH COLOPROCTOSTOMY  02-20-2007  . TONSILLECTOMY  AS CHILD  . URETEROSCOPY  07/04/2012   Procedure: URETEROSCOPY;  Surgeon: Claybon Jabs, MD;  Location: [facility];  Service: Urology;  Laterality: Left;  Marland Kitchen VAGINAL HYSTERECTOMY  1998   Partial  . WISDOM TOOTH EXTRACTION      OB History   None      Home Medications    Prior to Admission medications   Medication Sig Start  Date End Date Taking? Authorizing Provider  acetaminophen (TYLENOL) 500 MG tablet Take 1,000 mg by mouth every 6 (six) hours as needed.    [provider]  Aspirin-Acetaminophen-Caffeine (GOODY HEADACHE PO) Take 1 packet by mouth daily as needed (headache).    [provider]  diclofenac sodium (VOLTAREN) 1 % GEL Apply 3 g to 3 large joints up to 3 times daily 03/26/18   Ofilia Neas, PA-C  gabapentin (NEURONTIN) 600 MG tablet Take two capsules in am, 3 at lunch,1-2 at night 11/26/17   Marcial Pacas, MD  lamoTRIgine (LAMICTAL) 100 MG tablet TAKE 1  TABLET BY MOUTH EVERY MORNING AND TAKE 1 TABLET BY MOUTH AT NIGHT 12/02/17   Marcial Pacas, MD  OXcarbazepine ER (OXTELLAR XR) 300 MG TB24 Take 600 mg by mouth at bedtime. 10/10/17   Dennie Bible, NP    Family History Family History  Problem Relation Age of Onset  . Heart disease Maternal Grandmother   . Diabetes Mother   . Breast cancer Maternal Aunt   . Lupus Cousin   . Colon cancer Neg Hx   . Esophageal cancer Neg Hx   . Rectal cancer Neg Hx   . Stomach cancer Neg Hx     Social History Social History   Tobacco Use  . Smoking status: Never Smoker  . Smokeless tobacco: Never Used  Substance Use Topics  . Alcohol use: Yes    Comment: rarely  . Drug use: No     Allergies   Clindamycin hcl   Review of Systems Review of Systems  Constitutional: Negative for chills and fever.  HENT: Negative for ear pain and sore throat.   Eyes: Negative for pain and visual disturbance.  Respiratory: Negative for cough and shortness of breath.   Cardiovascular: Negative for chest pain and palpitations.  Gastrointestinal: Negative for abdominal pain and vomiting.  Genitourinary: Negative for dysuria and hematuria.  Musculoskeletal: Negative for arthralgias and back pain.  Skin: Negative for color change and rash.       Hand itching  Neurological: Negative for seizures and syncope.  All other systems reviewed and are negative.    Physical Exam Triage Vital Signs ED Triage Vitals [05/13/18 1845]  Enc Vitals Group     BP (!) 145/48     Pulse Rate 67     Resp 16     Temp 98.5 F (36.9 C)     Temp Source Oral     SpO2 100 %     Weight 197 lb (89.4 kg)     Height      Head Circumference      Peak Flow      Pain Score 8     Pain Loc      Pain Edu?      Excl. in [place]?    No data found.  Updated Vital Signs BP (!) 145/48   Pulse 67   Temp 98.5 F (36.9 C) (Oral)   Resp 16   Wt 89.4 kg   SpO2 100%   BMI 31.80 kg/m   Visual Acuity Right Eye Distance:   Left Eye  Distance:   Bilateral Distance:    Right Eye Near:   Left Eye Near:    Bilateral Near:     Physical Exam  Constitutional: She appears well-developed and well-nourished. No distress.  HENT:  Head: Normocephalic and atraumatic.  Mouth/Throat: Oropharynx is clear and moist.  Eyes: Pupils are equal, round, and reactive to light.  Conjunctivae are normal.  Neck: Normal range of motion.  Cardiovascular: Normal rate.  Pulmonary/Chest: Effort normal. No respiratory distress.  Abdominal: Soft. She exhibits no distension.  Musculoskeletal: Normal range of motion. She exhibits no edema.  Neurological: She is alert.  Skin: Skin is warm and dry.  Hand appears completely normal.  Psychiatric: She has a normal mood and affect. Her behavior is normal.     UC Treatments / Results  Labs (all labs ordered are listed, but only abnormal results are displayed) Labs Reviewed - No data to display  EKG None  Radiology No results found.  Procedures Procedures (including critical care time)  Medications Ordered in UC Medications - No data to display  Initial Impression / Assessment and Plan / UC Course  I have reviewed the triage vital signs and the nursing notes.  Pertinent labs & imaging results that were available during my care of the patient were reviewed by me and considered in my medical decision making (see chart for details).     Discussed that she may have had an insect bite or thorn.  I do not see any visible injury. Final Clinical Impressions(s) / UC Diagnoses   Final diagnoses:  Left hand pain     Discharge Instructions     Take benadryl for the itching It may cause drowsiness Watch for increased pain and swelling See your doctor in follow up   ED Prescriptions    None     Controlled Substance Prescriptions [place] Controlled Substance Registry consulted? Not Applicable   Raylene Everts, MD 05/13/18 2155

## 2018-05-13 NOTE — ED Triage Notes (Signed)
PT has a "sticking" sensation in two places over left hand. Feels as though something is in her hand. Started last night.

## 2018-05-23 ENCOUNTER — Other Ambulatory Visit: Payer: Self-pay | Admitting: Neurology

## 2018-06-05 ENCOUNTER — Telehealth: Payer: Self-pay

## 2018-06-05 NOTE — Telephone Encounter (Signed)
[employer] [phone] Member ID: [id number]  Unable to speak with an insurance rep but I was able to leave them the office number to call back and speak with me. Summer [name] is currently taking 4200 mg of Gabapentin for Trigeminal Neuralgia. Prescription is written as follows: Gabapentin 600 mg. Take 2 tablets (1200mg ) by mouth in the morning, take 3 tablets (1800 mg) at lunch and 1-2 tablets ([phone] mg) at night. Reducing her current dose causes her to experience (R) sided facial pain.  Trigeminal Neuralgia G50.0  Tried and Failed Medications: Flexeril Baclofen Hydrocodone ASA Tylenol  Ibuprofen Fioricet Voltaren Gel Tizanidine Prednisone  Summer [name] has done the gamma knife procedure x 2. She is currently taking Oxcarbazepine and Lamictal along with her Gabapentin.

## 2018-06-05 NOTE — Telephone Encounter (Signed)
Received a fax stating that her Ms. Klemz's Gabapentin refill was denied due to plan limitations exceeded. Please advise.

## 2018-07-01 ENCOUNTER — Encounter: Payer: Self-pay | Admitting: Internal Medicine

## 2018-07-01 ENCOUNTER — Other Ambulatory Visit (INDEPENDENT_AMBULATORY_CARE_PROVIDER_SITE_OTHER): Payer: Federal, State, Local not specified - PPO

## 2018-07-01 ENCOUNTER — Ambulatory Visit: Payer: Federal, State, Local not specified - PPO | Admitting: Internal Medicine

## 2018-07-01 VITALS — BP 118/60 | HR 69 | Temp 98.6°F | Ht 66.0 in | Wt 194.0 lb

## 2018-07-01 DIAGNOSIS — R519 Headache, unspecified: Secondary | ICD-10-CM

## 2018-07-01 DIAGNOSIS — K5900 Constipation, unspecified: Secondary | ICD-10-CM

## 2018-07-01 DIAGNOSIS — G44319 Acute post-traumatic headache, not intractable: Secondary | ICD-10-CM | POA: Diagnosis not present

## 2018-07-01 DIAGNOSIS — R413 Other amnesia: Secondary | ICD-10-CM | POA: Diagnosis not present

## 2018-07-01 DIAGNOSIS — R51 Headache: Secondary | ICD-10-CM

## 2018-07-01 DIAGNOSIS — H5712 Ocular pain, left eye: Secondary | ICD-10-CM | POA: Insufficient documentation

## 2018-07-01 HISTORY — DX: Ocular pain, left eye: H57.12

## 2018-07-01 HISTORY — DX: Constipation, unspecified: K59.00

## 2018-07-01 LAB — CBC
HCT: 33.2 % — ABNORMAL LOW (ref 36.0–46.0)
Hemoglobin: 10.5 g/dL — ABNORMAL LOW (ref 12.0–15.0)
MCHC: 31.6 g/dL (ref 30.0–36.0)
MCV: 80.9 fl (ref 78.0–100.0)
Platelets: 322 10*3/uL (ref 150.0–400.0)
RBC: 4.11 Mil/uL (ref 3.87–5.11)
RDW: 16.4 % — ABNORMAL HIGH (ref 11.5–15.5)
WBC: 4.8 10*3/uL (ref 4.0–10.5)

## 2018-07-01 LAB — COMPREHENSIVE METABOLIC PANEL
ALT: 8 U/L (ref 0–35)
AST: 14 U/L (ref 0–37)
Albumin: 3.9 g/dL (ref 3.5–5.2)
Alkaline Phosphatase: 68 U/L (ref 39–117)
BUN: 12 mg/dL (ref 6–23)
CO2: 31 mEq/L (ref 19–32)
Calcium: 9.1 mg/dL (ref 8.4–10.5)
Chloride: 103 mEq/L (ref 96–112)
Creatinine, Ser: 0.79 mg/dL (ref 0.40–1.20)
GFR: 93.87 mL/min (ref >60.00)
Glucose, Bld: 105 mg/dL — ABNORMAL HIGH (ref 70–99)
Potassium: 3.7 mEq/L (ref 3.5–5.1)
Sodium: 141 mEq/L (ref 135–145)
Total Bilirubin: 0.4 mg/dL (ref 0.2–1.2)
Total Protein: 6.7 g/dL (ref 6.0–8.3)

## 2018-07-01 LAB — HEMOGLOBIN A1C: Hgb A1c MFr Bld: 5.6 % (ref 4.6–6.5)

## 2018-07-01 LAB — VITAMIN D 25 HYDROXY (VIT D DEFICIENCY, FRACTURES): VITD: 16.06 ng/mL — ABNORMAL LOW (ref 30.00–100.00)

## 2018-07-01 NOTE — Assessment & Plan Note (Signed)
Has had gamma knife in the last several months and has not returned to her provider for this. Seeing neurology in January as well.

## 2018-07-01 NOTE — Assessment & Plan Note (Signed)
Asked her to return to neurology. Suspect the change is due to change in dose of gabapentin. Denies taking oxcarbazepine ever.

## 2018-07-01 NOTE — Assessment & Plan Note (Signed)
We did not have time to adequately address but suspect change in gabapentin rather than aging or dementia are related.

## 2018-07-01 NOTE — Patient Instructions (Signed)
The sharp pains could be related to the lower dose of gabapentin. These should go away in the next month or so.  For the stomach try taking senokot-d 1 pill daily to start. If this is not effective you can gradually increase up to maximum dose of 2 pills twice a day to help the bowels. The constipation is likely causing the increased sound in your stomach.

## 2018-07-01 NOTE — Assessment & Plan Note (Signed)
Advised senokot-d to help with chronic constipation. Advised that noises from her stomach are normal and mean that it is trying to work. She is up to date on colonoscopy.

## 2018-07-01 NOTE — Assessment & Plan Note (Signed)
Suspect that this could be related to change in dosing of gabapentin. Checking routine labs for changes including vitamin D, HgA1c, CMP, CBC.

## 2018-07-01 NOTE — Progress Notes (Signed)
   Subjective:    Patient ID: Summer [name], female    DOB: November 16, 1950, 67 y.o.   MRN: [id number]  HPI The patient is a 67 YO female coming in for several concerns including noise in her stomach (she is getting more noise in her stomach, it is growling throughout the day and in the evening, denies pain with this, is constipated and not taking anything for that except on the weekend when she is not working, does pass gas normally, denies blood in stool, last colonoscopy 2013 without polyps) and headaches (she has trigeminal neuralgia and has gone down on gabapentin dosing recently due to insurance not covering the amount she needs to take, she is getting headaches when lying down to bed, she is still seeing neurology but not seeing them until January, she also was asked by them to return to the people who did her gamma knife in the last 6 months, having similar symptoms to before the gamma knife) and pain in her left eye (in the last month or so she is getting shooting pain in her left eye, this is new, started around when she decreased the gabapentin dosing, they last less than a second, maybe once a week, denies change in vision or double or blurred vision). Also she then mentions that she is having some memory changes (some word finding problems which are not new but she is frustrated by lately).   Review of Systems  Constitutional: Negative.   HENT: Negative.   Eyes: Positive for pain.  Respiratory: Negative for cough, chest tightness and shortness of breath.   Cardiovascular: Negative for chest pain, palpitations and leg swelling.  Gastrointestinal: Positive for constipation. Negative for abdominal distention, abdominal pain, diarrhea, nausea and vomiting.       Stomach noise  Musculoskeletal: Negative.   Skin: Negative.   Neurological: Positive for speech difficulty and headaches. Negative for weakness and numbness.  Psychiatric/Behavioral: Negative.  Negative for self-injury, sleep  disturbance and suicidal ideas.      Objective:   Physical Exam  Constitutional: She is oriented to person, place, and time. She appears well-developed and well-nourished.  HENT:  Head: Normocephalic and atraumatic.  Some right and left temporal pain  Eyes: EOM are normal.  Neck: Normal range of motion.  Cardiovascular: Normal rate and regular rhythm.  Pulmonary/Chest: Effort normal and breath sounds normal. No respiratory distress. She has no wheezes. She has no rales.  Abdominal: Soft. Bowel sounds are normal. She exhibits no distension. There is no tenderness. There is no rebound.  Normal BS  Musculoskeletal: She exhibits no edema.  Neurological: She is alert and oriented to person, place, and time. Coordination normal.  Skin: Skin is warm and dry.   Vitals:   07/01/18 0759  BP: 118/60  Pulse: 69  Temp: 98.6 F (37 C)  TempSrc: Oral  SpO2: 99%  Weight: 194 lb (88 kg)  Height: 5\' 6"  (1.676 m)      Assessment & Plan:

## 2018-07-04 ENCOUNTER — Other Ambulatory Visit: Payer: Self-pay | Admitting: Internal Medicine

## 2018-07-04 MED ORDER — VITAMIN D (ERGOCALCIFEROL) 1.25 MG (50000 UNIT) PO CAPS: 50000.0000 [IU] | ORAL_CAPSULE | ORAL | 0 refills | Status: DC

## 2018-07-15 ENCOUNTER — Encounter: Payer: Self-pay | Admitting: *Deleted

## 2018-07-15 ENCOUNTER — Encounter: Payer: Self-pay | Admitting: Family

## 2018-07-15 ENCOUNTER — Telehealth: Payer: Self-pay | Admitting: Neurology

## 2018-07-15 ENCOUNTER — Ambulatory Visit: Payer: Federal, State, Local not specified - PPO | Admitting: Family

## 2018-07-15 DIAGNOSIS — R1032 Left lower quadrant pain: Secondary | ICD-10-CM | POA: Diagnosis not present

## 2018-07-15 NOTE — Telephone Encounter (Signed)
I was unable to reach patient by phone.  She has indicated in the past that it is difficult for her to answer the phone during work hours.  The following email was sent to her this afternoon:  Good Afternoon,   Dr. Krista Blue has reviewed your email. It is okay to stop lamotrigine and just use gabapentin alone. Per her orders, you will need to slowly taper off the lamotrigine by taking one less tablet per day for three days then stopping the medication . You may increase your gabapentin 600mg  dose back up to two tablets, three times daily. This will make your total daily dose of gabapentin 3600mg  and this is the highest that medication can be increased.  If you still have problems, Dr. Krista Blue said she is happy to see you in the office earlier than January. You may call our office at [phone] to speak to our appointment schedulers, if you decide on this route.   Thank you,  Sharyn Lull

## 2018-07-15 NOTE — Telephone Encounter (Signed)
  -----   Message -----  From: [name]  Sent: 07/15/2018 10:46 AM EST  To: [facility]  Subject: Non-Urgent Medical Question             Hi, Dr. Krista Blue,  I am concerned over the past few weeks I've had tremors going from the lower part of my jaw and under the top and bottom of my tongue that not the condition prior to the 2nd surgery with Dr. Salomon Fick. I have well cut back on my gabapentin from the dosage of 2 in the morning 2 at lunch and 1 at night along with the lamotrigine because the lamotrigine by itself with taking 1 tablet in the morning and 2 at night send me into a drunken state. the gabapentin alone seems to be more beneficial although I am still left with the slight lightning affect on tongue and lower part of mouth on the right side. I do not see you until January and as you know I still work in "the cold" and I am sure this has some affect however I thought the 2nd surgery was supposed to make things better. As I was told there may be problems on the right side of my face which I have resolved myself to bearing such as my eyeball has some pain at times and feels numb. Do I need a change in prescription?    Please call patient, what medication she is taking now, if she thinks gabapentin help her, it is ok for her to keep on gabapentin alone, polypharmacy will increase the chance of side effect.  Per record, she is taking gabapentin 600mg , it is ok to stay within the daily maximum dose of 3600mg  daily

## 2018-07-15 NOTE — Progress Notes (Signed)
Summer [name] is a 67 y.o. female with the following history as recorded in EpicCare:  Patient Active Problem List   Diagnosis Date Noted  . Left eye pain 07/01/2018  . Constipation 07/01/2018  . Weight loss 07/22/2017  . Memory loss 12/21/2016  . Tremor 12/21/2016  . Right sided temporal headache 12/21/2016  . Trochanteric bursitis, right hip 10/26/2016  . Pruritus 10/26/2016  . Foot sprain, left, initial encounter 08/31/2016  . Pain of right side of body 08/31/2016  . Headache 04/24/2016  . LLQ pain 04/24/2016  . Glossitis 02/02/2016  . Routine general medical examination at a health care facility 10/27/2015  . Facial pain 10/06/2015  . Trigeminal neuralgia of right side of face 11/30/2010  . Morbid obesity ([place]) 11/09/2010  . Fibromyalgia 11/09/2010  . HEMATURIA, HX OF 11/14/2009  . LOC OSTEOARTHROS NOT SPEC PRIM/SEC OTH [facility] SITE 06/27/2009  . Allergic rhinitis 08/26/2008  . 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 acetaminophen (TYLENOL) 500 MG tablet Take 1,000 mg by mouth every 6 (six) hours as needed.    . gabapentin (NEURONTIN) 600 MG tablet TAKE 2 TABLETS IN AM, 3 TABLETS AT LUNCH,1-2 TABLETS AT NIGHT 630 tablet 1  . lamoTRIgine (LAMICTAL) 100 MG tablet TAKE 1 TABLET BY MOUTH EVERY MORNING AND TAKE 1 TABLET BY MOUTH AT NIGHT 180 tablet 0  . Vitamin D, Ergocalciferol, (DRISDOL) 50000 units CAPS capsule Take 1 capsule (50,000 Units total) by mouth every 7 (seven) days. 12 capsule 0  . diclofenac sodium (VOLTAREN) 1 % GEL Apply 3 g to 3 large joints up to 3 times daily (Patient not taking: Reported on 07/01/2018) 3 Tube 3  . HYDROcodone-acetaminophen (NORCO/VICODIN) 5-325 MG tablet Take by mouth.    . OXcarbazepine ER (OXTELLAR XR) 300 MG TB24 Take 600 mg by mouth at bedtime. (Patient not taking: Reported on 07/01/2018) 60 tablet 6   No current facility-administered medications for this  visit.     Allergies: Clindamycin hcl  Past Medical History:  Diagnosis Date  . Arthritis shoulders and back  . 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 Diverticulosis   . Fibromyalgia   . History of kidney stones   . History of kidney stones   . History of shingles 2012--  no residual pain  . Left ureteral calculus   . MCI (mild cognitive impairment)   . Trigeminal neuralgia RIGHT  . Urgency of urination     Past Surgical History:  Procedure Laterality Date  . CEREBRAL MICROVASCULAR DECOMPRESSION  07-05-2006   RIGHT TRIGEMINAL NERVE  . CYSTOSCOPY/RETROGRADE/URETEROSCOPY/STONE EXTRACTION WITH BASKET  07/04/2012   Procedure: CYSTOSCOPY/RETROGRADE/URETEROSCOPY/STONE EXTRACTION WITH BASKET;  Surgeon: Claybon Jabs, MD;  Location: [facility];  Service: Urology;  Laterality: Left;  . EXPLORATORY LAPAROTOMY/ RESECTION MID TO DISTAL SIGMOID AND PROXIMAL RECTUM/ END PROXIMAL SIGMOID COLOSTOMY  07-17-2006   PERFORATED DIVERTICULITIS WITH PERITONITIS  . gamma knife  05/17/2016   for trigeminal neuralgia, [facility], Dr Salomon Fick  . gamma knife  2019  . RESECTION COLOSTOMY/ CLOSURE COLOSTOMY WITH COLOPROCTOSTOMY  02-20-2007  . TONSILLECTOMY  AS CHILD  . URETEROSCOPY  07/04/2012   Procedure: URETEROSCOPY;  Surgeon: Claybon Jabs, MD;  Location: [facility];  Service: Urology;  Laterality: Left;  Marland Kitchen VAGINAL HYSTERECTOMY  1998   Partial  . WISDOM TOOTH EXTRACTION      Family  History  Problem Relation Age of Onset  . Heart disease Maternal Grandmother   . Diabetes Mother   . Breast cancer Maternal Aunt   . Lupus Cousin   . Colon cancer Neg Hx   . Esophageal cancer Neg Hx   . Rectal cancer Neg Hx   . Stomach cancer Neg Hx     Social History   Tobacco Use  . Smoking status: Never Smoker  . Smokeless tobacco: Never Used  Substance Use Topics  . Alcohol use: Yes    Comment: rarely    Subjective:   Patient presents with concerns for LLQ pain "for years"; difficult historian; per patient, symptoms have been more frequent in the past few weeks; s/p partial colectomy in 2008 for diverticulitis; very concerned that she may be having recurrent symptoms; hurts to lie on her left side; no fever, no vomiting, no changes in bowel movement;     Objective:  Vitals:   07/15/18 1551  BP: 124/60  Pulse: 79  Temp: 98.3 F (36.8 C)  TempSrc: Oral  SpO2: 99%  Weight: 189 lb (85.7 kg)  Height: 5\' 6"  (1.676 m)    General: Well developed, well nourished, in no acute distress  Skin : Warm and dry.  Head: Normocephalic and atraumatic  Lungs: Respirations unlabored; clear to auscultation bilaterally without wheeze, rales, rhonchi  CVS exam: normal rate and regular rhythm.  Abdomen: Soft; nontender; nondistended; normoactive bowel sounds; no masses or hepatosplenomegaly  Neurologic: Alert and oriented; speech intact; face symmetrical; moves all extremities well; CNII-XII intact without focal deficit   Assessment:  1. Left lower quadrant abdominal pain     Plan:  Patient is very concerned for diverticulitis flare- understandable due to history of colectomy; will update abd/ pelvic CT as she requests; if this is normal, will then need to update lumbar X-ray and left hip X-ray; follow-up to be determined.   No follow-ups on file.  Orders Placed This Encounter  Procedures  . CT Abdomen Pelvis W Contrast    Standing Status:   Future    Standing Expiration Date:   10/16/2019    Order Specific Question:   If indicated for the ordered procedure, I authorize the administration of contrast media per Radiology protocol    Answer:   Yes    Order Specific Question:   Preferred imaging location?    Answer:   [address]    Order Specific Question:   Is Oral Contrast requested for this exam?    Answer:   Yes, Per Radiology protocol    Order Specific Question:   Radiology Contrast Protocol - do  NOT remove file path    Answer:   [email or web address]    Requested Prescriptions    No prescriptions requested or ordered in this encounter

## 2018-07-17 ENCOUNTER — Ambulatory Visit: Payer: Federal, State, Local not specified - PPO | Admitting: Internal Medicine

## 2018-07-17 ENCOUNTER — Ambulatory Visit (INDEPENDENT_AMBULATORY_CARE_PROVIDER_SITE_OTHER)
Admission: RE | Admit: 2018-07-17 | Discharge: 2018-07-17 | Disposition: A | Discharge status: Home / Self Care | Payer: Federal, State, Local not specified - PPO | Source: Ambulatory Visit | Attending: Family | Admitting: Family

## 2018-07-17 DIAGNOSIS — R1032 Left lower quadrant pain: Secondary | ICD-10-CM

## 2018-07-17 MED ORDER — IOPAMIDOL (ISOVUE-300) INJECTION 61%
100.0000 mL | Freq: Once | INTRAVENOUS | Status: AC | PRN
  Administered 2018-07-17: 100 mL via INTRAVENOUS

## 2018-07-24 ENCOUNTER — Ambulatory Visit: Payer: Federal, State, Local not specified - PPO | Admitting: Rheumatology

## 2018-08-25 ENCOUNTER — Other Ambulatory Visit: Payer: Self-pay | Admitting: Neurology

## 2018-09-07 ENCOUNTER — Ambulatory Visit (HOSPITAL_COMMUNITY)
Admission: EM | Admit: 2018-09-07 | Discharge: 2018-09-07 | Disposition: A | Discharge status: Home / Self Care | Payer: Federal, State, Local not specified - PPO | Attending: Family Medicine | Admitting: Family Medicine

## 2018-09-07 ENCOUNTER — Encounter (HOSPITAL_COMMUNITY): Payer: Self-pay | Admitting: Emergency Medicine

## 2018-09-07 DIAGNOSIS — R238 Other skin changes: Secondary | ICD-10-CM | POA: Insufficient documentation

## 2018-09-07 DIAGNOSIS — Z23 Encounter for immunization: Secondary | ICD-10-CM | POA: Diagnosis not present

## 2018-09-07 DIAGNOSIS — M7989 Other specified soft tissue disorders: Secondary | ICD-10-CM

## 2018-09-07 MED ORDER — CEPHALEXIN 500 MG PO CAPS: 500.0000 mg | ORAL_CAPSULE | Freq: Four times a day (QID) | ORAL | 0 refills | Status: AC

## 2018-09-07 MED ORDER — TETANUS-DIPHTH-ACELL PERTUSSIS 5-2.5-18.5 LF-MCG/0.5 IM SUSP
0.5000 mL | Freq: Once | INTRAMUSCULAR | Status: AC
  Administered 2018-09-07: 0.5 mL via INTRAMUSCULAR

## 2018-09-07 MED ORDER — TETANUS-DIPHTH-ACELL PERTUSSIS 5-2.5-18.5 LF-MCG/0.5 IM SUSP
INTRAMUSCULAR | Status: AC
  Filled 2018-09-07: qty 0.5

## 2018-09-07 NOTE — ED Triage Notes (Signed)
Pt here with right hand pain, swelling and redness after working in shed yesterday

## 2018-09-07 NOTE — ED Provider Notes (Signed)
[facility]    CSN: [id number] Arrival date & time: 09/07/18  1006     History   Chief Complaint Chief Complaint  Patient presents with  . Hand Pain    HPI Summer [name] is a 67 y.o. female history of fibromyalgia presenting today for evaluation of right hand swelling and redness.  Patient states that yesterday she was working on a shed, believes she scratched her hand on a rusty tool.  Towards the end of the day she noticed some mild discomfort in her hand overnight she developed increased redness and swelling.  She is unsure if she had any thing bite her.  Denies difficulty moving her hand or fingers.  Denies numbness or tingling.  Unsure of when her last tetanus was.  HPI  Past Medical History:  Diagnosis Date  . Arthritis shoulders and back  . 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 Diverticulosis   . Fibromyalgia   . History of kidney stones   . History of kidney stones   . History of shingles 2012--  no residual pain  . Left ureteral calculus   . MCI (mild cognitive impairment)   . Trigeminal neuralgia RIGHT  . Urgency of urination     Patient Active Problem List   Diagnosis Date Noted  . Left eye pain 07/01/2018  . Constipation 07/01/2018  . Weight loss 07/22/2017  . Memory loss 12/21/2016  . Tremor 12/21/2016  . Right sided temporal headache 12/21/2016  . Trochanteric bursitis, right hip 10/26/2016  . Pruritus 10/26/2016  . Foot sprain, left, initial encounter 08/31/2016  . Pain of right side of body 08/31/2016  . Headache 04/24/2016  . LLQ pain 04/24/2016  . Glossitis 02/02/2016  . Routine general medical examination at a health care facility 10/27/2015  . Facial pain 10/06/2015  . Trigeminal neuralgia of right side of face 11/30/2010  . Morbid obesity ([place]) 11/09/2010  . Fibromyalgia 11/09/2010  . HEMATURIA, HX OF 11/14/2009  . LOC OSTEOARTHROS NOT SPEC PRIM/SEC OTH [facility] SITE  06/27/2009  . Allergic rhinitis 08/26/2008  . 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 CEREBRAL MICROVASCULAR DECOMPRESSION  07-05-2006   RIGHT TRIGEMINAL NERVE  . CYSTOSCOPY/RETROGRADE/URETEROSCOPY/STONE EXTRACTION WITH BASKET  07/04/2012   Procedure: CYSTOSCOPY/RETROGRADE/URETEROSCOPY/STONE EXTRACTION WITH BASKET;  Surgeon: Claybon Jabs, MD;  Location[facility];  Service: Urology;  Laterality: Left;  . EXPLORATORY LAPAROTOMY/ RESECTION MID TO DISTAL SIGMOID AND PROXIMAL RECTUM/ END PROXIMAL SIGMOID COLOSTOMY  07-17-2006   PERFORATED DIVERTICULITIS WITH PERITONITIS  . gamma knife  05/17/2016   for trigeminal neuralgia, [facility], Dr Salomon Fick  . gamma knife  2019  . RESECTION COLOSTOMY/ CLOSURE COLOSTOMY WITH COLOPROCTOSTOMY  02-20-2007  . TONSILLECTOMY  AS CHILD  . URETEROSCOPY  07/04/2012   Procedure: URETEROSCOPY;  Surgeon: Claybon Jabs, MD;  [facility];  Service: Urology;  Laterality: Left;  Marland Kitchen VAGINAL HYSTERECTOMY  1998   Partial  . WISDOM TOOTH EXTRACTION      OB History   No obstetric history on file.      Home Medications    Prior to Admission medications   Medication Sig Start Date End Date Taking? Authorizing Provider  acetaminophen (TYLENOL) 500 MG tablet Take 1,000 mg by mouth every 6 (six) hours as needed.    [provider]  cephALEXin (KEFLEX) 500 MG capsule Take 1  capsule (500 mg total) by mouth 4 (four) times daily for 7 days. 09/07/18 09/14/18  Wieters, Hallie C, PA-C  gabapentin (NEURONTIN) 600 MG tablet TAKE 2 TABLETS IN AM, 3 TABLETS AT LUNCH,1-2 TABLETS AT NIGHT 05/23/18   Dennie Bible, NP  HYDROcodone-acetaminophen (NORCO/VICODIN) 5-325 MG tablet Take by mouth. 03/05/16   [provider]  lamoTRIgine (LAMICTAL) 100 MG tablet TAKE 1 TABLET BY MOUTH EVERY MORNING AND TAKE 1 TABLET BY MOUTH AT NIGHT 12/02/17   Marcial Pacas, MD    OXcarbazepine ER (OXTELLAR XR) 300 MG TB24 Take 600 mg by mouth at bedtime. Patient not taking: Reported on 07/01/2018 10/10/17   Dennie Bible, NP  Vitamin D, Ergocalciferol, (DRISDOL) 50000 units CAPS capsule Take 1 capsule (50,000 Units total) by mouth every 7 (seven) days. 07/04/18   Hoyt Koch, MD    Family History Family History  Problem Relation Age of Onset  . Heart disease Maternal Grandmother   . Diabetes Mother   . Breast cancer Maternal Aunt   . Lupus Cousin   . Colon cancer Neg Hx   . Esophageal cancer Neg Hx   . Rectal cancer Neg Hx   . Stomach cancer Neg Hx     Social History Social History   Tobacco Use  . Smoking status: Never Smoker  . Smokeless tobacco: Never Used  Substance Use Topics  . Alcohol use: Yes    Comment: rarely  . Drug use: No     Allergies   Clindamycin hcl   Review of Systems Review of Systems  Constitutional: Negative for fatigue and fever.  Eyes: Negative for visual disturbance.  Respiratory: Negative for shortness of breath.   Cardiovascular: Negative for chest pain.  Gastrointestinal: Negative for abdominal pain, nausea and vomiting.  Musculoskeletal: Positive for joint swelling and myalgias. Negative for arthralgias.  Skin: Positive for color change. Negative for rash and wound.  Neurological: Negative for dizziness, weakness, light-headedness and headaches.     Physical Exam Triage Vital Signs ED Triage Vitals  Enc Vitals Group     BP 09/07/18 1036 135/65     Pulse Rate 09/07/18 1036 72     Resp 09/07/18 1036 16     Temp 09/07/18 1036 99 F (37.2 C)     Temp Source 09/07/18 1036 Oral     SpO2 09/07/18 1036 100 %     Weight --      Height --      Head Circumference --      Peak Flow --      Pain Score 09/07/18 1038 5     Pain Loc --      Pain Edu? --      Excl. in [place]? --    No data found.  Updated Vital Signs BP 135/65 (BP Location: Left Arm)   Pulse 72   Temp 99 F (37.2 C) (Oral)    Resp 16   SpO2 100%   Visual Acuity Right Eye Distance:   Left Eye Distance:   Bilateral Distance:    Right Eye Near:   Left Eye Near:    Bilateral Near:     Physical Exam Vitals signs and nursing note reviewed.  Constitutional:      Appearance: She is well-developed.     Comments: No acute distress  HENT:     Head: Normocephalic and atraumatic.     Nose: Nose normal.  Eyes:     Conjunctiva/sclera: Conjunctivae normal.  Neck:  Musculoskeletal: Neck supple.  Cardiovascular:     Rate and Rhythm: Normal rate.  Pulmonary:     Effort: Pulmonary effort is normal. No respiratory distress.  Abdominal:     General: There is no distension.  Musculoskeletal: Normal range of motion.     Comments: Right hand: With swelling and erythema to first through third meta carpals, mild tenderness to palpation, increased warmth  Full active range of motion of all fingers and wrist Radial pulse 2+, cap refill less than 2 seconds  Skin:    General: Skin is warm and dry.  Neurological:     Mental Status: She is alert and oriented to person, place, and time.      UC Treatments / Results  Labs (all labs ordered are listed, but only abnormal results are displayed) Labs Reviewed - No data to display  EKG None  Radiology No results found.  Procedures Procedures (including critical care time)  Medications Ordered in UC Medications  Tdap (BOOSTRIX) injection 0.5 mL (0.5 mLs Intramuscular Given 09/07/18 1105)    Initial Impression / Assessment and Plan / UC Course  I have reviewed the triage vital signs and the nursing notes.  Pertinent labs & imaging results that were available during my care of the patient were reviewed by me and considered in my medical decision making (see chart for details).     Hand redness and swelling concerning for possible cellulitis secondary to an abrasion versus bite with local inflammation.  Patient has full active range of motion.  Given spreading  quickly will initiate treatment with Keflex to cover for cellulitis, recommended anti-inflammatories as well as ice and elevation.  Continue to monitor pain, swelling and redness, follow-up if symptoms not improving or they are worsening.Discussed strict return precautions. Patient verbalized understanding and is agreeable with plan.  Final Clinical Impressions(s) / UC Diagnoses   Final diagnoses:  Redness and swelling of hand     Discharge Instructions     Please begin taking Keflex 4 times a day for the next week Please apply ice to hand Tylenol and ibuprofen for pain and swelling We updated your tetanus today  Please continue to monitor your redness, swelling, pain and ability to move your hand and fingers, please follow-up if symptoms worsening or progressing without improvement with the antibiotic    ED Prescriptions    Medication Sig Dispense Auth. Provider   cephALEXin (KEFLEX) 500 MG capsule Take 1 capsule (500 mg total) by mouth 4 (four) times daily for 7 days. 28 capsule Wieters, Hallie C, PA-C     Controlled Substance Prescriptions [place] Controlled Substance Registry consulted? Not Applicable   Janith Lima, [place] 09/08/18 [phone]

## 2018-09-07 NOTE — Discharge Instructions (Signed)
Please begin taking Keflex 4 times a day for the next week Please apply ice to hand Tylenol and ibuprofen for pain and swelling We updated your tetanus today  Please continue to monitor your redness, swelling, pain and ability to move your hand and fingers, please follow-up if symptoms worsening or progressing without improvement with the antibiotic

## 2018-09-22 NOTE — Progress Notes (Signed)
Office Visit Note  Patient: Summer [name]             Date of Birth: 26-Sep-1950           MRN: [id number]             PCP: Hoyt Koch, MD Referring: Hoyt Koch, * Visit Date: 10/02/2018 Occupation: @GUAROCC @  Subjective:  Left knee pain.   History of Present Illness: DARETH ANDREW is a 68 y.o. female with history of osteoarthritis, degenerative disc disease and fibromyalgia.  She states for the last 4 to 5 months she has been having severe pain and discomfort in her left knee joint.  She also has nocturnal pain.  She has some discomfort in her right knee but not as severe.  She also gives history of left trochanteric bursa pain.  She continues to have neck and lower back pain.  She has generalized pain from fibromyalgia.  Activities of Daily Living:  Patient reports morning stiffness for 10 minutes.   Patient Reports nocturnal pain.  Difficulty dressing/grooming: Reports Difficulty climbing stairs: Reports Difficulty getting out of chair: Reports Difficulty using hands for taps, buttons, cutlery, and/or writing: Denies  Review of Systems  Constitutional: Positive for fatigue. Negative for night sweats, weight gain and weight loss.  HENT: Negative for mouth sores, trouble swallowing, trouble swallowing, mouth dryness and nose dryness.   Eyes: Negative for pain, redness, visual disturbance and dryness.  Respiratory: Negative for cough, shortness of breath and difficulty breathing.   Cardiovascular: Negative for chest pain, palpitations, hypertension, irregular heartbeat and swelling in legs/feet.  Gastrointestinal: Negative for blood in stool, constipation and diarrhea.  Endocrine: Negative for increased urination.  Genitourinary: Negative for vaginal dryness.  Musculoskeletal: Positive for arthralgias, joint pain, myalgias, morning stiffness and myalgias. Negative for joint swelling, muscle weakness and muscle tenderness.  Skin: Negative for color  change, rash, hair loss, skin tightness, ulcers and sensitivity to sunlight.  Allergic/Immunologic: Negative for susceptible to infections.  Neurological: Negative for dizziness, memory loss, night sweats and weakness.  Hematological: Negative for swollen glands.  Psychiatric/Behavioral: Positive for sleep disturbance. Negative for depressed mood. The patient is not nervous/anxious.     PMFS History:  Patient Active Problem List   Diagnosis Date Noted  . Left eye pain 07/01/2018  . Constipation 07/01/2018  . Weight loss 07/22/2017  . Memory loss 12/21/2016  . Tremor 12/21/2016  . Right sided temporal headache 12/21/2016  . Trochanteric bursitis, right hip 10/26/2016  . Pruritus 10/26/2016  . Foot sprain, left, initial encounter 08/31/2016  . Pain of right side of body 08/31/2016  . Headache 04/24/2016  . LLQ pain 04/24/2016  . Glossitis 02/02/2016  . Routine general medical examination at a health care facility 10/27/2015  . Facial pain 10/06/2015  . Trigeminal neuralgia of right side of face 11/30/2010  . Morbid obesity ([place]) 11/09/2010  . Fibromyalgia 11/09/2010  . HEMATURIA, HX OF 11/14/2009  . LOC OSTEOARTHROS NOT SPEC PRIM/SEC OTH [facility] SITE 06/27/2009  . Allergic rhinitis 08/26/2008  . 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 Arthritis shoulders and back  . 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 Diverticulosis   . Fibromyalgia   . History of kidney stones   . History of kidney stones   . History of shingles 2012--  no residual pain  . Left ureteral calculus   .  MCI (mild cognitive impairment)   . Trigeminal neuralgia RIGHT  . Urgency of urination     Family History  Problem Relation Age of Onset  . Heart disease Maternal Grandmother   . Diabetes Mother   . Breast cancer Maternal Aunt   . Lupus Cousin   . Colon cancer Neg Hx   . Esophageal cancer Neg Hx     . Rectal cancer Neg Hx   . Stomach cancer Neg Hx    Past Surgical History:  Procedure Laterality Date  . CEREBRAL MICROVASCULAR DECOMPRESSION  07-05-2006   RIGHT TRIGEMINAL NERVE  . CYSTOSCOPY/RETROGRADE/URETEROSCOPY/STONE EXTRACTION WITH BASKET  07/04/2012   Procedure: CYSTOSCOPY/RETROGRADE/URETEROSCOPY/STONE EXTRACTION WITH BASKET;  Surgeon: Claybon Jabs, MD;  Location: [facility];  Service: Urology;  Laterality: Left;  . EXPLORATORY LAPAROTOMY/ RESECTION MID TO DISTAL SIGMOID AND PROXIMAL RECTUM/ END PROXIMAL SIGMOID COLOSTOMY  07-17-2006   PERFORATED DIVERTICULITIS WITH PERITONITIS  . gamma knife  05/17/2016   for trigeminal neuralgia, [facility], Dr Salomon Fick  . gamma knife  2019  . RESECTION COLOSTOMY/ CLOSURE COLOSTOMY WITH COLOPROCTOSTOMY  02-20-2007  . TONSILLECTOMY  AS CHILD  . URETEROSCOPY  07/04/2012   Procedure: URETEROSCOPY;  Surgeon: Claybon Jabs, MD;  Location: [facility];  Service: Urology;  Laterality: Left;  Marland Kitchen VAGINAL HYSTERECTOMY  1998   Partial  . WISDOM TOOTH EXTRACTION     Social History   Social History Narrative  . Not on file   Immunization History  Administered Date(s) Administered  . Td 09/10/1998, 06/27/2009  . Tdap 09/07/2018  . Zoster 10/27/2015     Objective: Vital Signs: BP 125/69 (BP Location: Left Arm, Patient Position: Sitting, Cuff Size: Normal)   Pulse 75   Resp 16   Ht 5\' 6"  (1.676 m)   Wt 186 lb (84.4 kg)   BMI 30.02 kg/m    Physical Exam Vitals signs and nursing note reviewed.  Constitutional:      Appearance: She is well-developed.  HENT:     Head: Normocephalic and atraumatic.  Eyes:     Conjunctiva/sclera: Conjunctivae normal.  Neck:     Musculoskeletal: Normal range of motion.  Cardiovascular:     Rate and Rhythm: Normal rate and regular rhythm.     Heart sounds: Normal heart sounds.  Pulmonary:     Effort: Pulmonary effort is normal.     Breath sounds: Normal breath sounds.   Abdominal:     General: Bowel sounds are normal.     Palpations: Abdomen is soft.  Lymphadenopathy:     Cervical: No cervical adenopathy.  Skin:    General: Skin is warm and dry.     Capillary Refill: Capillary refill takes less than 2 seconds.  Neurological:     Mental Status: She is alert and oriented to person, place, and time.  Psychiatric:        Behavior: Behavior normal.      Musculoskeletal Exam: C-spine thoracic and lumbar spine limited range of motion with discomfort.  She had discomfort range of motion of bilateral shoulder joints.  Elbow joints wrist joints with good range of motion.  She has good range of motion of her hip joints with tenderness over left trochanteric bursa.  She has warmth and swelling in her left knee joint.  Other joints were in full range of motion with no synovitis. CDAI Exam: CDAI Score: Not documented Patient Global Assessment: Not documented; Provider Global Assessment: Not documented Swollen: Not documented; Tender: Not documented  Joint Exam   Not documented   There is currently no information documented on the homunculus. Go to the Rheumatology activity and complete the homunculus joint exam.  Investigation: No additional findings.  Imaging: Xr Knee 3 View Left  Result Date: 10/02/2018 Moderate medial compartment narrowing was noted.  Severe patellofemoral narrowing was noted. Impression: These findings are consistent with moderate osteoarthritis and severe chondromalacia patella.   Recent Labs: Lab Results  Component Value Date   WBC 4.8 07/01/2018   HGB 10.5 (L) 07/01/2018   PLT 322.0 07/01/2018   NA 141 07/01/2018   K 3.7 07/01/2018   CL 103 07/01/2018   CO2 31 07/01/2018   GLUCOSE 105 (H) 07/01/2018   BUN 12 07/01/2018   CREATININE 0.79 07/01/2018   BILITOT 0.4 07/01/2018   ALKPHOS 68 07/01/2018   AST 14 07/01/2018   ALT 8 07/01/2018   PROT 6.7 07/01/2018   ALBUMIN 3.9 07/01/2018   CALCIUM 9.1 07/01/2018   GFRAA >60  10/28/2017    Speciality Comments: No specialty comments available.  Procedures:  Large Joint Inj: L greater trochanter on 10/02/2018 3:39 PM Indications: pain Details: 27 G 1.5 in needle, lateral approach  Arthrogram: No  Medications: 40 mg triamcinolone acetonide 40 MG/ML; 1.5 mL lidocaine 1 % Aspirate: 0 mL Outcome: tolerated well, no immediate complications Procedure, treatment alternatives, risks and benefits explained, specific risks discussed. Consent was given by the patient. Immediately prior to procedure a time out was called to verify the correct patient, procedure, equipment, support staff and site/side marked as required. Patient was prepped and draped in the usual sterile fashion.   Large Joint Inj: L knee on 10/02/2018 3:39 PM Indications: pain Details: 27 G 1.5 in needle, medial approach  Arthrogram: No  Medications: 40 mg triamcinolone acetonide 40 MG/ML; 1.5 mL lidocaine 1 % Aspirate: 0 mL Outcome: tolerated well, no immediate complications Procedure, treatment alternatives, risks and benefits explained, specific risks discussed. Consent was given by the patient. Immediately prior to procedure a time out was called to verify the correct patient, procedure, equipment, support staff and site/side marked as required. Patient was prepped and draped in the usual sterile fashion.     Allergies: Clindamycin hcl   Assessment / Plan:     Visit Diagnoses: Chronic pain of left knee -she has been experiencing pain in her left knee joint for last few months.  She also has nocturnal pain.  She has warmth and swelling on examination today.  Plan: XR KNEE 3 VIEW LEFT.  X-ray was consistent with moderate osteoarthritis and severe patellofemoral narrowing.  After informed consent was obtained left knee joint was prepped and injected with cortisone as described above.  Primary osteoarthritis of both knees-she has chronic discomfort in both knees.  Trochanteric bursitis of left  hip-she has difficulty sleeping on her left side due to trochanteric bursitis.  Per her request left trochanteric bursa was also injected with cortisone as described above.  DDD (degenerative disc disease), cervical-she has limited range of motion with discomfort.  DDD (degenerative disc disease), thoracic-chronic pain  DDD (degenerative disc disease), lumbar-chronic pain  Fibromyalgia-she continues to have some generalized pain and discomfort.  History of insomnia-good sleep hygiene was discussed.  History of trigeminal neuralgia  History of diverticulitis   Orders: Orders Placed This Encounter  Procedures  . Large Joint Inj: L greater trochanter  . Large Joint Inj: L knee  . XR KNEE 3 VIEW LEFT   No orders of the defined types were placed  in this encounter.   Face-to-face time spent with patient was 30 minutes. Greater than 50% of time was spent in counseling and coordination of care.  Follow-Up Instructions: Return in about 6 months (around 04/02/2019) for Osteoarthritis, FMS.   Bo Merino, MD  Note - This record has been created using Editor, commissioning.  Chart creation errors have been sought, but may not always  have been located. Such creation errors do not reflect on  the standard of medical care.

## 2018-09-24 ENCOUNTER — Other Ambulatory Visit: Payer: Self-pay | Admitting: Neurology

## 2018-09-30 ENCOUNTER — Telehealth (INDEPENDENT_AMBULATORY_CARE_PROVIDER_SITE_OTHER): Payer: Self-pay

## 2018-09-30 ENCOUNTER — Telehealth: Payer: Self-pay | Admitting: Rheumatology

## 2018-09-30 NOTE — Telephone Encounter (Signed)
Okay thank you!  I left a message on patient's answering machine to let her know that you will resubmit to her insurance company and when we receive the approval I will call to schedule the appointments.

## 2018-09-30 NOTE — Telephone Encounter (Signed)
Patient called stating she would like to schedule her Synvisc injections.  The approval was 04/29/18.  Patient states there has been no changes with her insurance.  Do we need to resend to insurance before scheduling the appointments?

## 2018-09-30 NOTE — Telephone Encounter (Signed)
Resubmitted VOB for Synvisc series, bilateral knee. Previously approved for Synvisc series 04/2018.

## 2018-09-30 NOTE — Telephone Encounter (Signed)
Yes we do.  I will resubmit.  Thank you.

## 2018-10-01 ENCOUNTER — Other Ambulatory Visit: Payer: Self-pay | Admitting: Internal Medicine

## 2018-10-01 ENCOUNTER — Telehealth (INDEPENDENT_AMBULATORY_CARE_PROVIDER_SITE_OTHER): Payer: Self-pay

## 2018-10-01 NOTE — Telephone Encounter (Signed)
LMOM for patient to call and schedule her Synvisc injections

## 2018-10-01 NOTE — Telephone Encounter (Signed)
Patient is approved for Synvisc series, bilateral knee. [place] Patient will be responsible for 30% OOP. Co-pay of $150.00 No PA required  Please schedule patient an appointment with Dr. Estanislado Pandy or Lovena Le for gel injection.  Thank You.

## 2018-10-02 ENCOUNTER — Other Ambulatory Visit: Payer: Self-pay | Admitting: Internal Medicine

## 2018-10-02 ENCOUNTER — Ambulatory Visit (INDEPENDENT_AMBULATORY_CARE_PROVIDER_SITE_OTHER): Payer: Federal, State, Local not specified - PPO

## 2018-10-02 ENCOUNTER — Encounter: Payer: Self-pay | Admitting: Rheumatology

## 2018-10-02 ENCOUNTER — Ambulatory Visit: Payer: Federal, State, Local not specified - PPO | Admitting: Rheumatology

## 2018-10-02 VITALS — BP 125/69 | HR 75 | Resp 16 | Ht 66.0 in | Wt 186.0 lb

## 2018-10-02 DIAGNOSIS — M170 Bilateral primary osteoarthritis of knee: Secondary | ICD-10-CM

## 2018-10-02 DIAGNOSIS — Z8719 Personal history of other diseases of the digestive system: Secondary | ICD-10-CM

## 2018-10-02 DIAGNOSIS — M25562 Pain in left knee: Secondary | ICD-10-CM

## 2018-10-02 DIAGNOSIS — M5030 Other cervical disc degeneration, unspecified cervical region: Secondary | ICD-10-CM

## 2018-10-02 DIAGNOSIS — G8929 Other chronic pain: Secondary | ICD-10-CM | POA: Diagnosis not present

## 2018-10-02 DIAGNOSIS — Z87898 Personal history of other specified conditions: Secondary | ICD-10-CM

## 2018-10-02 DIAGNOSIS — Z8669 Personal history of other diseases of the nervous system and sense organs: Secondary | ICD-10-CM

## 2018-10-02 DIAGNOSIS — M7062 Trochanteric bursitis, left hip: Secondary | ICD-10-CM | POA: Diagnosis not present

## 2018-10-02 DIAGNOSIS — Z1231 Encounter for screening mammogram for malignant neoplasm of breast: Secondary | ICD-10-CM

## 2018-10-02 DIAGNOSIS — M797 Fibromyalgia: Secondary | ICD-10-CM

## 2018-10-02 DIAGNOSIS — M5134 Other intervertebral disc degeneration, thoracic region: Secondary | ICD-10-CM

## 2018-10-02 DIAGNOSIS — M5136 Other intervertebral disc degeneration, lumbar region: Secondary | ICD-10-CM

## 2018-10-02 MED ORDER — LIDOCAINE HCL 1 % IJ SOLN
1.5000 mL | INTRAMUSCULAR | Status: AC | PRN
  Administered 2018-10-02: 1.5 mL

## 2018-10-02 MED ORDER — TRIAMCINOLONE ACETONIDE 40 MG/ML IJ SUSP
40.0000 mg | INTRAMUSCULAR | Status: AC | PRN
  Administered 2018-10-02: 40 mg via INTRA_ARTICULAR

## 2018-10-03 ENCOUNTER — Telehealth: Payer: Self-pay | Admitting: Rheumatology

## 2018-10-03 NOTE — Telephone Encounter (Signed)
Patient paid $25.00 on 10/02/2018 for FMLA form that was sent to [employer] on 10/03/2018.

## 2018-10-06 ENCOUNTER — Encounter: Payer: Self-pay | Admitting: Neurology

## 2018-10-06 ENCOUNTER — Ambulatory Visit: Payer: Federal, State, Local not specified - PPO | Admitting: Neurology

## 2018-10-06 VITALS — BP 124/60 | HR 60 | Ht 66.0 in | Wt 189.5 lb

## 2018-10-06 DIAGNOSIS — G500 Trigeminal neuralgia: Secondary | ICD-10-CM | POA: Diagnosis not present

## 2018-10-06 MED ORDER — LAMOTRIGINE ER 200 MG PO TB24: 200.0000 mg | ORAL_TABLET | Freq: Every day | ORAL | 11 refills | Status: DC

## 2018-10-06 NOTE — Progress Notes (Addendum)
PATIENT: [name] [name] DOB: 03/26/1951  REASON FOR VISIT: follow up HISTORY FROM: patient  HISTORY OF PRESENT ILLNESS: Today 10/06/18  [name] [name] is a 68 year old female presenting for follow-up for trigeminal neuralgia. She has had 2 gamma knife procedures most recent in 2019. She thinks she isn't feeling as good as she had hoped, in fact she thinks it may have made her worse. She still has pain to right side of her face. Pain under her eye, and pain under her chin. Her tongue sometimes feels like sandpaper. She is taking gabapentin 600 mg capsules, three in the morning, three midday, two at bedtime. When she takes the gabapentin it settles the pain down, making it manageable. Rarely she will take lamictal at bedtime. She reports 25% of the day she is pain free. In the morning is the worst pain. She doesn't like Oxtellar, because it doesn't work in her opinion. In the past she has tried lyrica, gabapentin, tegretol, trileptal, and cymbalta. 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    HISTORY 10/10/17 [name] [name]. [name] [name] is a 68 years old right-handed African American female, referred by her primary care physician Dr. Stevie [name] for evaluation of right facial pain  She was diagnosed with right trigeminal neuralgia in 2006, presenting with severe electronics shooting pain at her right upper molar, she had microvascular decompression surgery at [facility] by Dr. Harrison [name] around 2006, which has helped the severe pain, but she continued to have annoying almost constant pain at her right face, she describes hot cold sensitivity over right face, touching of right forehead region will also set of her right facial pain, she felt right tongue swelling, she bites on her right tongue sometimes, she works at customer service, on the phone many hours each day, with prolonged talking, she felt sandpaper was rubbing against her right tongue, also complains of right upper and  lower lip numbness, intermittent electronics shooting pain,  Over the years, she has tried different medication, was evaluated by our office Dr. Brett [name] in 2008, she has tried gabapentin, Lyrica, Cymbalta, Trileptal, currently is taking Tegretol 200 mg twice a day, initially it helps her some, but failed to help work consistently, She denied visual change, recent few months of left lower back, left hip pain, radiating to left posterior thigh, to her left leg and left foot, difficulty walking after prolonged sitting She complains of left low back pain, left hip pain, radiating pain to left foot, like walking on a dead, burning sensation, she continued to complains of right facial constant achy pain, there was no significant change after started on Oxtellar xR 300 mg every day, no significant side effects,  I have reviewed MRI, mild small vessel disease, no significant abnormality otherwise,  She had MRI lumbar spine (without) demonstrating: L5-S1: facet hypertrophy with moderate right and severe left foraminal stenosis  L4-5: pseudo disc bulging and facet hypertrophy with moderate right and mild left foraminal stenosis. There is 2 mm anterior spondylolisthesis of L4 on L5.  Degenerative spondylosis and disc bulging L1-2 and L4-5.  UPDATE Sep 30 2014:YY She felt sand paper sensation in her right tongue, flashing sensation, last 1-2 weeks, only take gabapentin 341m tid now. She also complains of snoring, frequent wakening during the nighttime, excessive sleepiness at daytime, F ESS score is 37, ESS is 9  UPDATE 01/26/2017CMMs. Summer [name] 68year old female returns for follow-up. She has a history of right trigeminal neuralgia is currently on Neurontin  300 mg 3 times a day has breakthrough pain. 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 She occasionally will have upper and lower lip numbness. She returns for  reevaluation  UPDATE February 09 2016:YY She complained increased right facial pain since April 2017, involving right V1/V2 branch, she is taking gabapentin 300 mg 4 times a day, only helped her mildly, triggered by wind blow on her right face, talking, chewing, she works as a Restaurant manager, fast food, it is very difficult for her to perform her job,  Update March 01 2016:YY She presented to the emergency room February 21 2016 for nausea, unexpected fall, night before that because severe facial pain, she took extra dose of lamotrigine, made it to 250 mg that particular day, laboratory evaluation showed mildly low potassium of CMP WAS GIVEN SUPPLEMENT, NORMAL CBC, lamotrigine level in June 15 was 15.6,  For a while, she had severe right facial pain, was taking gabapentin 300 mg 2 tablets every 2 hours, 3 tablets at night, about 11 tablets each day 3300 mg every day plus lamotrigine 100 mg twice a day,   Lamotrigine 100 mg twice a day has been very helpful, it has taking care of her long bothered right tongue sandpaper sensation. She is now taking gabapentin 300 mg 3/2/3 tablets each day, she has been off lamotrigine since February 26 2016, the sandpaper sensation on the right side of the tongue came back, she is now taking Cymbalta 30 mg 3 times a day  She has been complains 3 weeks history of urinary urgency, difficulty initiating urine, UA showed mild UTI, was treated with Cipro without improving her symptoms  UPDATE May 10 2016:YY She was evaluated by Dr. Salomon [name] in July 2017, is planning on to have gamma knife in early September, she complains of mild memory loss, difficulty handling her computer tasks sometimes, there was no family history of memory loss, she also complains of episode, right after she get out of the bed, She is on polypharmacy, now taking lamotrigine 100 mg twice a day, gabapentin 300 mg 4 in the morning, 2 at lunch time, 4 tablets at nighttime. She also has obesity, snoring, excessive  daytime fatigue and sleepiness  We have personally reviewed CT head without contrast in June 2017, no acute abnormality, MRI of the cervical in 2008, multilevel degenerative changes, most severe at C5-6, C6-7, with herniated disc, mild canal stenosis, mild cord signal changes.  Laboratory evaluation in 2017, elevated WBC 11 point 8, hemoglobin of 13, no significant abnormality on BMP  UPDATE Apr 02 2017:YY We have personally reviewed MRI of the brain in April 2018 generalized atrophy mild supratentorium small vessel disease MRI of cervical spine April, multilevel degenerative changes most severe at C6-7, mild canal stenosissignificant foraminal narrowing or signal changes,  She had a, knife by [facility] Dr. Angelene Giovanni in September 2017 she denies significant improvement, now cold air still trigger her right facial pain, bleeding from right temporal region along the right face, right jaw, electronic shooting pain, to her right lower molar region, she has intermittent left handtremor,driving,  She is now taking Lamictal 100 mg 3 tablets each day, gabapentin 600 mg 5 tablets each day, despite combination therapy, she remains symptomatic, she is able to go to work. She is on the phone 10 hours a day, mild memory loss. I reviewed laboratory evaluation in 2018, negative troponin, CBC with hemoglobin  of 11.9, CMP, creatinine of 0.84, ESR, mild elevated C reactive protein 7.8, lamotrigine 12.9, TSH 1.2 7,  UPDATE 06/26/17 [name] [name] [name], 68 year old female returns for follow-up with history of trigeminal neuralgia. Her facial pain is not controlled. Patient continues to have right-sided facial pain and her tongue feels like sandpaper after being on her job for 10 hours a day. She has gargled with salt water. She works at a call center.  She is currently on Lamictal 100 twice daily, Oxtellar XR 300 mg daily, gabapentin 691m 2 tabs in the a.m., 3 tabs at lunch 1-2 at  Night. She denies side effects to  the meds.She denies any falls. Tremor is stable She had Gamma knife by Dr. TSalomon Fickin September 2017. She has not followed up with him. He gabapentin level XI.2 on 04/02/2017. Lamictal level 11.3.These are therapeutic CBC and CMP in June within normal limits .She returns for reevaluation UPDATE 1/31/2019CM.  [name] [name] 68year old female returns for follow-up with history of right facial pain.  When last seen her Oxtellar dose was increased with good benefit.  Her facial pain is in control.  She is also on gabapentin and Lamictal.  She denies any side effects to the medication.   She had gamma knife surgery by Dr. TSalomon Fickin September 2017 Most recent comprehensive metabolic panel with sodium level of 140.and CBC within normal limits.  She continues to work at a call center.  She returns for reevaluation  REVIEW OF SYSTEMS: Out of a complete 14 system review of symptoms, the patient complains only of the following symptoms, and all other reviewed systems are negative  Facial pain   ALLERGIES: Allergies  Allergen Reactions  . Clindamycin Hcl Itching    itch    HOME MEDICATIONS: Outpatient Medications Prior to Visit  Medication Sig Dispense Refill  . acetaminophen (TYLENOL) 500 MG tablet Take 1,000 mg by mouth every 6 (six) hours as needed.    . gabapentin (NEURONTIN) 600 MG tablet TAKE 2 TABLETS IN AM, 3 TABLETS AT LUNCH,1-2 TABLETS AT NIGHT 630 tablet 1  . HYDROcodone-acetaminophen (NORCO/VICODIN) 5-325 MG tablet Take by mouth.    . lamoTRIgine (LAMICTAL) 100 MG tablet Take 100 mg by mouth at bedtime as needed.    . Vitamin D, Ergocalciferol, (DRISDOL) 50000 units CAPS capsule Take 1 capsule (50,000 Units total) by mouth every 7 (seven) days. 12 capsule 0  . lamoTRIgine (LAMICTAL) 100 MG tablet TAKE 1 TABLET BY MOUTH EVERY MORNING AND TAKE 1 TABLET BY MOUTH AT NIGHT 180 tablet 0  . OXcarbazepine ER (OXTELLAR XR) 300 MG TB24 Take 600 mg by mouth at bedtime. (Patient not taking: Reported on  07/01/2018) 60 tablet 6   No facility-administered medications prior to visit.     PAST MEDICAL HISTORY: Past Medical History:  Diagnosis Date  . Arthritis shoulders and back  . 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 Diverticulosis   . Fibromyalgia   . History of kidney stones   . History of kidney stones   . History of shingles 2012--  no residual pain  . Left ureteral calculus   . MCI (mild cognitive impairment)   . Trigeminal neuralgia RIGHT  . Urgency of urination     PAST SURGICAL HISTORY: Past Surgical History:  Procedure Laterality Date  . CEREBRAL MICROVASCULAR DECOMPRESSION  07-05-2006   RIGHT TRIGEMINAL NERVE  . CYSTOSCOPY/RETROGRADE/URETEROSCOPY/STONE EXTRACTION WITH BASKET  07/04/2012   Procedure: CYSTOSCOPY/RETROGRADE/URETEROSCOPY/STONE EXTRACTION WITH  BASKET;  Surgeon: Claybon Jabs, MD;  Location: [facility];  Service: Urology;  Laterality: Left;  . EXPLORATORY LAPAROTOMY/ RESECTION MID TO DISTAL SIGMOID AND PROXIMAL RECTUM/ END PROXIMAL SIGMOID COLOSTOMY  07-17-2006   PERFORATED DIVERTICULITIS WITH PERITONITIS  . gamma knife  05/17/2016   for trigeminal neuralgia, [facility], Dr [name] [name]  . gamma knife  2019  . RESECTION COLOSTOMY/ CLOSURE COLOSTOMY WITH COLOPROCTOSTOMY  02-20-2007  . TONSILLECTOMY  AS CHILD  . URETEROSCOPY  07/04/2012   Procedure: URETEROSCOPY;  Surgeon: Claybon Jabs, MD;  Location: [facility];  Service: Urology;  Laterality: Left;  Marland Kitchen VAGINAL HYSTERECTOMY  1998   Partial  . WISDOM TOOTH EXTRACTION      FAMILY HISTORY: Family History  Problem Relation Age of Onset  . Heart disease Maternal Grandmother   . Diabetes Mother   . Breast cancer Maternal Aunt   . Lupus Cousin   . Colon cancer Neg Hx   . Esophageal cancer Neg Hx   . Rectal cancer Neg Hx   . Stomach cancer Neg Hx     SOCIAL HISTORY: Social History   Socioeconomic History  . Marital  status: Married    Spouse name: Not on file  . Number of children: 1  . Years of education: College  . Highest education level: Not on file  Occupational History    Employer: UNEMPLOYED  . Occupation: HUMAN RESOURCES     Employer: [employer]  Social Needs  . Financial resource strain: Not on file  . Food insecurity:    Worry: Not on file    Inability: Not on file  . Transportation needs:    Medical: Not on file    Non-medical: Not on file  Tobacco Use  . Smoking status: Never Smoker  . Smokeless tobacco: Never Used  Substance and Sexual Activity  . Alcohol use: Yes    Comment: rarely  . Drug use: No  . Sexual activity: Not on file  Lifestyle  . Physical activity:    Days per week: Not on file    Minutes per session: Not on file  . Stress: Not on file  Relationships  . Social connections:    Talks on phone: Not on file    Gets together: Not on file    Attends religious service: Not on file    Active member of club or organization: Not on file    Attends meetings of clubs or organizations: Not on file    Relationship status: Not on file  . Intimate partner violence:    Fear of current or ex partner: Not on file    Emotionally abused: Not on file    Physically abused: Not on file    Forced sexual activity: Not on file  Other Topics Concern  . Not on file  Social History Narrative  . Not on file      PHYSICAL EXAM  Vitals:   10/06/18 1536  BP: 124/60  Pulse: 60  Weight: 189 lb 8 oz (86 kg)  Height: 5' 6"  (1.676 m)   Body mass index is 30.59 kg/m.  Generalized: Well developed, in no acute distress   Neurological examination  Mentation: Alert oriented to time, place, history taking. Follows all commands speech and language fluent Cranial nerve II-XII: Pupils were equal round reactive to light. Extraocular movements were full, visual field were full on confrontational test. Facial sensation and strength were normal. Uvula tongue midline. Head turning  and  shoulder shrug  were normal and symmetric. Motor: The motor testing reveals 5 over 5 strength of all 4 extremities. Good symmetric motor tone is noted throughout.  Sensory: Decreased light touch to right side of her face V1-V3. Sensory testing is intact to soft touch on all 4 extremities. No evidence of extinction is noted.  Coordination: Cerebellar testing reveals good finger-nose-finger and heel-to-shin bilaterally.  Gait and station: Gait is normal. Tandem gait is normal. Romberg is negative. No drift is seen.  Reflexes: Deep tendon reflexes are symmetric and normal bilaterally.   DIAGNOSTIC DATA (LABS, IMAGING, TESTING) - I reviewed patient records, labs, notes, testing and imaging myself where available.  Lab Results  Component Value Date   WBC 4.8 07/01/2018   HGB 10.5 (L) 07/01/2018   HCT 33.2 (L) 07/01/2018   MCV 80.9 07/01/2018   PLT 322.0 07/01/2018      Component Value Date/Time   NA 141 07/01/2018 0841   K 3.7 07/01/2018 0841   CL 103 07/01/2018 0841   CO2 31 07/01/2018 0841   GLUCOSE 105 (H) 07/01/2018 0841   GLUCOSE 96 08/20/2006 1433   BUN 12 07/01/2018 0841   CREATININE 0.79 07/01/2018 0841   CALCIUM 9.1 07/01/2018 0841   PROT 6.7 07/01/2018 0841   ALBUMIN 3.9 07/01/2018 0841   AST 14 07/01/2018 0841   ALT 8 07/01/2018 0841   ALKPHOS 68 07/01/2018 0841   BILITOT 0.4 07/01/2018 0841   GFRNONAA >60 10/28/2017 1433   GFRAA >60 10/28/2017 1433   Lab Results  Component Value Date   CHOL 203 (H) 07/22/2017   HDL 78.10 07/22/2017   LDLCALC 117 (H) 07/22/2017   LDLDIRECT 141.1 10/01/2013   TRIG 43.0 07/22/2017   CHOLHDL 3 07/22/2017   Lab Results  Component Value Date   HGBA1C 5.6 07/01/2018   Lab Results  Component Value Date   VITAMINB12 419 07/22/2017   Lab Results  Component Value Date   TSH 0.728 10/28/2017      ASSESSMENT AND PLAN 68 y.o. year old female   1. Trigeminal Neuralgia   We will start Lamictal 200 ER once at bedtime. She  has been taking too much Gabapentin and didn't realize that her tablets were 600 mg each. We have instructed her to take no more than six of her 600 mg tablets daily. Possibly taking two in the morning, at lunch, then at bedtime would be easiest to manage. Hopefully, the Lamictal will manage her pain well, and she will be able to decrease her gabapentin dose. She has tried several different medications in the past for her pain and none have proven beneficial. We will check a lamictal and gabapentin level today. She will follow-up in 6 months or sooner if needed.    I spent 15 minutes with the patient. 50% of this time was spent discussing her plan of care.   I saw patient together with my NP Dat Derksen. Agree above plan  Butler Denmark, AGNP-C, DNP 10/06/2018, 3:52 PM [facility] [address] [place], [place] [address] [phone]

## 2018-10-08 LAB — LAMOTRIGINE LEVEL: Lamotrigine Lvl: 5.3 ug/mL (ref 2.0–20.0)

## 2018-10-08 LAB — GABAPENTIN LEVEL: Gabapentin Lvl: 5.9 ug/mL (ref 4.0–16.0)

## 2018-10-10 ENCOUNTER — Ambulatory Visit: Payer: Self-pay | Admitting: Nurse Practitioner

## 2018-10-12 ENCOUNTER — Emergency Department (HOSPITAL_COMMUNITY)
Admission: EM | Admit: 2018-10-12 | Discharge: 2018-10-13 | Disposition: A | Discharge status: Home / Self Care | Payer: Federal, State, Local not specified - PPO | Attending: Emergency Medicine | Admitting: Emergency Medicine

## 2018-10-12 ENCOUNTER — Encounter (HOSPITAL_COMMUNITY): Payer: Self-pay | Admitting: Emergency Medicine

## 2018-10-12 DIAGNOSIS — Z79899 Other long term (current) drug therapy: Secondary | ICD-10-CM | POA: Diagnosis not present

## 2018-10-12 DIAGNOSIS — L299 Pruritus, unspecified: Secondary | ICD-10-CM | POA: Insufficient documentation

## 2018-10-12 LAB — CBC
HCT: 34.8 % — ABNORMAL LOW (ref 36.0–46.0)
Hemoglobin: 10.1 g/dL — ABNORMAL LOW (ref 12.0–15.0)
MCH: 24 pg — ABNORMAL LOW (ref 26.0–34.0)
MCHC: 29 g/dL — ABNORMAL LOW (ref 30.0–36.0)
MCV: 82.9 fL (ref 80.0–100.0)
Platelets: 379 10*3/uL (ref 150–400)
RBC: 4.2 MIL/uL (ref 3.87–5.11)
RDW: 16 % — ABNORMAL HIGH (ref 11.5–15.5)
WBC: 7.9 10*3/uL (ref 4.0–10.5)
nRBC: 0 % (ref 0.0–0.2)

## 2018-10-12 LAB — BASIC METABOLIC PANEL
Anion gap: 13 (ref 5–15)
BUN: 11 mg/dL (ref 8–23)
CO2: 22 mmol/L (ref 22–32)
Calcium: 9.3 mg/dL (ref 8.9–10.3)
Chloride: 101 mmol/L (ref 98–111)
Creatinine, Ser: 0.96 mg/dL (ref 0.44–1.00)
GFR calc Af Amer: >60 mL/min (ref >60)
GFR calc non Af Amer: >60 mL/min (ref >60)
Glucose, Bld: 136 mg/dL — ABNORMAL HIGH (ref 70–99)
Potassium: 3.1 mmol/L — ABNORMAL LOW (ref 3.5–5.1)
Sodium: 136 mmol/L (ref 135–145)

## 2018-10-12 NOTE — ED Notes (Signed)
Pt unable to void at this time. 

## 2018-10-12 NOTE — ED Triage Notes (Signed)
Pt reports allergic reaction onset last night. States she inc her dosage of her Lamictal and was told by her doctor she could potentially have a "reaction" that could "cause her organs to fail" Pt is highly anxious in triage. States she took Benadryl PTA.

## 2018-10-13 ENCOUNTER — Telehealth: Payer: Self-pay | Admitting: Internal Medicine

## 2018-10-13 ENCOUNTER — Encounter: Payer: Self-pay | Admitting: Neurology

## 2018-10-13 ENCOUNTER — Encounter: Payer: Self-pay | Admitting: *Deleted

## 2018-10-13 ENCOUNTER — Other Ambulatory Visit: Payer: Self-pay | Admitting: Internal Medicine

## 2018-10-13 ENCOUNTER — Ambulatory Visit: Payer: Federal, State, Local not specified - PPO | Admitting: Neurology

## 2018-10-13 ENCOUNTER — Telehealth: Payer: Self-pay | Admitting: Neurology

## 2018-10-13 VITALS — BP 151/74 | HR 98 | Ht 66.0 in | Wt 189.5 lb

## 2018-10-13 DIAGNOSIS — L299 Pruritus, unspecified: Secondary | ICD-10-CM

## 2018-10-13 DIAGNOSIS — G500 Trigeminal neuralgia: Secondary | ICD-10-CM | POA: Diagnosis not present

## 2018-10-13 HISTORY — DX: Pruritus, unspecified: L29.9

## 2018-10-13 LAB — URINALYSIS, ROUTINE W REFLEX MICROSCOPIC
Bilirubin Urine: NEGATIVE
Glucose, UA: NEGATIVE mg/dL
Ketones, ur: NEGATIVE mg/dL
Nitrite: NEGATIVE
Protein, ur: 30 mg/dL — AB
RBC / HPF: >50 RBC/hpf — ABNORMAL HIGH (ref 0–5)
Specific Gravity, Urine: 1.021 (ref 1.005–1.030)
pH: 5 (ref 5.0–8.0)

## 2018-10-13 MED ORDER — PREDNISONE 20 MG PO TABS: 20.0000 mg | ORAL_TABLET | Freq: Once | ORAL | Status: DC

## 2018-10-13 MED ORDER — PREDNISONE 20 MG PO TABS: 20.0000 mg | ORAL_TABLET | Freq: Every day | ORAL | 0 refills | Status: DC

## 2018-10-13 NOTE — Telephone Encounter (Signed)
Noted  

## 2018-10-13 NOTE — Discharge Instructions (Addendum)
If itching does not resolve with the prednisone, follow up with Dr. Krista Blue about the increased dose of medication as it relates to symptoms.

## 2018-10-13 NOTE — Addendum Note (Signed)
Addended by: Marcial Pacas on: 10/13/2018 05:22 PM   Modules accepted: Level of Service

## 2018-10-13 NOTE — ED Provider Notes (Signed)
[facility] EMERGENCY DEPARTMENT Provider Note   CSN: [id number] Arrival date & time: 10/12/18  2315     History   Chief Complaint Chief Complaint  Patient presents with  . Allergic Reaction    HPI Summer [name] is a 68 y.o. female.  Patient to ED with complaint of generalized, constant itching without rash and frontal headache for the past 5 days. She states that the day the symptoms started, she had a medication change by her neurologist Krista Blue), specifically an increase to her lamotrigine. She has been taking this medication for years for trigeminal neuralgia. No nausea, vomiting, SOB, facial or intraoral swelling, and no difficulty swallowing. She took a benadryl prior to coming to the hospital tonight and reports no relief.   The history is provided by the patient. No language interpreter was used.  Allergic Reaction  Presenting symptoms: no difficulty swallowing and no rash     Past Medical History:  Diagnosis Date  . Arthritis shoulders and back  . 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 Diverticulosis   . Fibromyalgia   . History of kidney stones   . History of kidney stones   . History of shingles 2012--  no residual pain  . Left ureteral calculus   . MCI (mild cognitive impairment)   . Trigeminal neuralgia RIGHT  . Urgency of urination     Patient Active Problem List   Diagnosis Date Noted  . Left eye pain 07/01/2018  . Constipation 07/01/2018  . Weight loss 07/22/2017  . Memory loss 12/21/2016  . Tremor 12/21/2016  . Right sided temporal headache 12/21/2016  . Trochanteric bursitis, right hip 10/26/2016  . Pruritus 10/26/2016  . Foot sprain, left, initial encounter 08/31/2016  . Pain of right side of body 08/31/2016  . Headache 04/24/2016  . LLQ pain 04/24/2016  . Glossitis 02/02/2016  . Routine general medical examination at a health care facility 10/27/2015  . Facial pain  10/06/2015  . Trigeminal neuralgia of right side of face 11/30/2010  . Morbid obesity ([place]) 11/09/2010  . Fibromyalgia 11/09/2010  . HEMATURIA, HX OF 11/14/2009  . LOC OSTEOARTHROS NOT SPEC PRIM/SEC OTH [facility] SITE 06/27/2009  . Allergic rhinitis 08/26/2008  . 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 CEREBRAL MICROVASCULAR DECOMPRESSION  07-05-2006   RIGHT TRIGEMINAL NERVE  . CYSTOSCOPY/RETROGRADE/URETEROSCOPY/STONE EXTRACTION WITH BASKET  07/04/2012   Procedure: CYSTOSCOPY/RETROGRADE/URETEROSCOPY/STONE EXTRACTION WITH BASKET;  Surgeon: Claybon Jabs, MD;  Location: [facility];  Service: Urology;  Laterality: Left;  . EXPLORATORY LAPAROTOMY/ RESECTION MID TO DISTAL SIGMOID AND PROXIMAL RECTUM/ END PROXIMAL SIGMOID COLOSTOMY  07-17-2006   PERFORATED DIVERTICULITIS WITH PERITONITIS  . gamma knife  05/17/2016   for trigeminal neuralgia, [facility], Dr Salomon Fick  . gamma knife  2019  . RESECTION COLOSTOMY/ CLOSURE COLOSTOMY WITH COLOPROCTOSTOMY  02-20-2007  . TONSILLECTOMY  AS CHILD  . URETEROSCOPY  07/04/2012   Procedure: URETEROSCOPY;  Surgeon: Claybon Jabs, MD;  Location: [facility];  Service: Urology;  Laterality: Left;  Marland Kitchen VAGINAL HYSTERECTOMY  1998   Partial  . WISDOM TOOTH EXTRACTION       OB History   No obstetric history on file.      Home Medications    Prior to Admission medications   Medication Sig Start Date End Date Taking? Authorizing Provider  acetaminophen (TYLENOL) 500 MG  tablet Take 1,000 mg by mouth every 6 (six) hours as needed.    [provider]  gabapentin (NEURONTIN) 600 MG tablet TAKE 2 TABLETS IN AM, 3 TABLETS AT Section AT NIGHT 05/23/18   Dennie Bible, NP  HYDROcodone-acetaminophen (NORCO/VICODIN) 5-325 MG tablet Take by mouth. 03/05/16   [provider]  LamoTRIgine 200 MG TB24 24 hour tablet Take 1 tablet (200 mg total)  by mouth at bedtime. 10/06/18   Marcial Pacas, MD  Vitamin D, Ergocalciferol, (DRISDOL) 50000 units CAPS capsule Take 1 capsule (50,000 Units total) by mouth every 7 (seven) days. 07/04/18   Hoyt Koch, MD    Family History Family History  Problem Relation Age of Onset  . Heart disease Maternal Grandmother   . Diabetes Mother   . Breast cancer Maternal Aunt   . Lupus Cousin   . Colon cancer Neg Hx   . Esophageal cancer Neg Hx   . Rectal cancer Neg Hx   . Stomach cancer Neg Hx     Social History Social History   Tobacco Use  . Smoking status: Never Smoker  . Smokeless tobacco: Never Used  Substance Use Topics  . Alcohol use: Yes    Comment: rarely  . Drug use: No     Allergies   Clindamycin hcl   Review of Systems Review of Systems  HENT: Negative.  Negative for facial swelling and trouble swallowing.   Respiratory: Negative.  Negative for shortness of breath.   Cardiovascular: Negative.  Negative for chest pain.  Gastrointestinal: Negative.  Negative for nausea.  Skin: Negative.  Negative for rash.       C/o generalized itching  Neurological: Positive for headaches.     Physical Exam Updated Vital Signs BP (!) 159/76 (BP Location: Right Arm)   Pulse (!) 113   Temp 98.1 F (36.7 C) (Oral)   Resp 18   SpO2 100%   Physical Exam Vitals signs and nursing note reviewed.  Constitutional:      Appearance: She is well-developed.  HENT:     Head: Normocephalic.     Mouth/Throat:     Mouth: Mucous membranes are moist.     Pharynx: Oropharynx is clear.  Neck:     Musculoskeletal: Normal range of motion and neck supple.  Cardiovascular:     Rate and Rhythm: Normal rate and regular rhythm.  Pulmonary:     Effort: Pulmonary effort is normal.     Breath sounds: Normal breath sounds.  Abdominal:     General: Bowel sounds are normal.     Palpations: Abdomen is soft.     Tenderness: There is no abdominal tenderness. There is no guarding or rebound.    Musculoskeletal: Normal range of motion.  Skin:    General: Skin is warm and dry.     Findings: No rash.  Neurological:     Mental Status: She is alert and oriented to person, place, and time.      ED Treatments / Results  Labs (all labs ordered are listed, but only abnormal results are displayed) Labs Reviewed  CBC - Abnormal; Notable for the following components:      Result Value   Hemoglobin 10.1 (*)    HCT 34.8 (*)    MCH 24.0 (*)    MCHC 29.0 (*)    RDW 16.0 (*)    All other components within normal limits  BASIC METABOLIC PANEL - Abnormal; Notable for the following components:   Potassium  3.1 (*)    Glucose, Bld 136 (*)    All other components within normal limits  URINALYSIS, ROUTINE W REFLEX MICROSCOPIC - Abnormal; Notable for the following components:   APPearance HAZY (*)    Hgb urine dipstick LARGE (*)    Protein, ur 30 (*)    Leukocytes, UA MODERATE (*)    RBC / HPF >50 (*)    Bacteria, UA RARE (*)    All other components within normal limits    EKG None  Radiology No results found.  Procedures Procedures (including critical care time)  Medications Ordered in ED Medications - No data to display   Initial Impression / Assessment and Plan / ED Course  I have reviewed the triage vital signs and the nursing notes.  Pertinent labs & imaging results that were available during my care of the patient were reviewed by me and considered in my medical decision making (see chart for details).     Patient to ED with complaint of itching. It is generalized, x 5 days, without rash. She reports an increase to her lamotrigine on the day the symptoms started.   Chart reviewed. The patient has been seen for similar symptoms in the past with diagnosis of "pruritis". Given prednisone 20 mg daily x 5 days at that time, she states with resolution. Will try the prednisone rather than decreasing her lamotrigine as there is no clear indication that this is what is  causing her itching, especially with history of same.   The patient is agreeable to trying prednisone and following up with Dr. Krista Blue if no better to discuss her medication regimen.   Final Clinical Impressions(s) / ED Diagnoses   Final diagnoses:  None   1. Pruritis  ED Discharge Orders    None       Charlann Lange, PA-C 10/13/18 [id number]    Orpah Greek, MD 10/13/18 [phone]

## 2018-10-13 NOTE — ED Notes (Signed)
Pt to front desk stating "I have been here for five hours, how much longer" Informed pt she checked in 2.5 hrs ago, and she would be the next person assigned a room when available.

## 2018-10-13 NOTE — Telephone Encounter (Signed)
Spoke to patient and gave response, once we were taking she said she was in the ED for 5 hours and when she was leaving she was aggravated and when asked if she wanted the RX she told them no. I told her that's why it wasn't sent in and she said she would figure something else out.

## 2018-10-13 NOTE — Telephone Encounter (Signed)
Patient in lobby requesting work note to give her supervisor for leaving work due to severe itching and headache due to medication. Best call back is [phone]

## 2018-10-13 NOTE — Telephone Encounter (Signed)
Copied from [place] [id number]. Topic: Quick Communication - See Telephone Encounter >> Oct 13, 2018  2:37 PM Bea Graff, NT wrote: CRM for notification. See Telephone encounter for: 10/13/18. Pt states the hospital sent in predniSONE (DELTASONE) 20 MG tablet for her last night and the pharmacy has not received the medication and she is wanting to see if Dr. Sharlet Salina can send this in? [id number] [address], [place] [phone] (Phone) [phone] (Fax)

## 2018-10-13 NOTE — Telephone Encounter (Signed)
Per Dr. Krista Blue, add to schedule today so she can physically evaluate the patient.

## 2018-10-13 NOTE — ED Notes (Signed)
This RN attempted to give patient prednisone PO and d/c instructions with prescriptions, pt refused to take PO meds, stating she has some at home. Pt also unwilling to be given d/c instructions.  Pt ambulatory from department with steady gait, NAD.

## 2018-10-13 NOTE — Progress Notes (Signed)
PATIENT: Summer [name] DOB: 1951/07/05  REASON FOR VISIT: follow up HISTORY FROM: patient  HISTORY OF PRESENT ILLNESS: Today 10/13/18  Summer [name] is a 68 year old female presenting for follow-up for trigeminal neuralgia. She has had 2 gamma knife procedures most recent in 2019. She thinks she isn't feeling as good as she had hoped, in fact she thinks it may have made her worse. She still has pain to right side of her face. Pain under her eye, and pain under her chin. Her tongue sometimes feels like sandpaper. She is taking gabapentin 600 mg capsules, three in the morning, three midday, two at bedtime. When she takes the gabapentin it settles the pain down, making it manageable. Rarely she will take lamictal at bedtime. She reports 25% of the day she is pain free. In the morning is the worst pain. She doesn't like Oxtellar, because it doesn't work in her opinion. In the past she has tried lyrica, gabapentin, tegretol, trileptal, and cymbalta. 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    HISTORY 10/10/17 [name] is a 68 years old right-handed African American female, referred by her primary care physician Dr. Stevie Kern for evaluation of right facial pain  She was diagnosed with right trigeminal neuralgia in 2006, presenting with severe electronics shooting pain at her right upper molar, she had microvascular decompression surgery at [facility] by Dr. Harrison Mons around 2006, which has helped the severe pain, but she continued to have annoying almost constant pain at her right face, she describes hot cold sensitivity over right face, touching of right forehead region will also set of her right facial pain, she felt right tongue swelling, she bites on her right tongue sometimes, she works at customer service, on the phone many hours each day, with prolonged talking, she felt sandpaper was rubbing against her right tongue, also complains of right upper and  lower lip numbness, intermittent electronics shooting pain,  Over the years, she has tried different medication, was evaluated by our office Dr. Brett Fairy in 2008, she has tried gabapentin, Lyrica, Cymbalta, Trileptal, currently is taking Tegretol 200 mg twice a day, initially it helps her some, but failed to help work consistently, She denied visual change, recent few months of left lower back, left hip pain, radiating to left posterior thigh, to her left leg and left foot, difficulty walking after prolonged sitting She complains of left low back pain, left hip pain, radiating pain to left foot, like walking on a dead, burning sensation, she continued to complains of right facial constant achy pain, there was no significant change after started on Oxtellar xR 300 mg every day, no significant side effects,  I have reviewed MRI, mild small vessel disease, no significant abnormality otherwise,  She had MRI lumbar spine (without) demonstrating: L5-S1: facet hypertrophy with moderate right and severe left foraminal stenosis  L4-5: pseudo disc bulging and facet hypertrophy with moderate right and mild left foraminal stenosis. There is 2 mm anterior spondylolisthesis of L4 on L5.  Degenerative spondylosis and disc bulging L1-2 and L4-5.  UPDATE Sep 30 2014:YY She felt sand paper sensation in her right tongue, flashing sensation, last 1-2 weeks, only take gabapentin 351m tid now. She also complains of snoring, frequent wakening during the nighttime, excessive sleepiness at daytime, F ESS score is 37, ESS is 9  UPDATE 01/26/2017CMMs. LBurress 68year old female returns for follow-up. She has a history of right trigeminal neuralgia is currently on Neurontin  300 mg 3 times a day has breakthrough pain. 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 She occasionally will have upper and lower lip numbness. She returns for  reevaluation  UPDATE February 09 2016:YY She complained increased right facial pain since April 2017, involving right V1/V2 branch, she is taking gabapentin 300 mg 4 times a day, only helped her mildly, triggered by wind blow on her right face, talking, chewing, she works as a Restaurant manager, fast food, it is very difficult for her to perform her job,  Update March 01 2016:YY She presented to the emergency room February 21 2016 for nausea, unexpected fall, night before that because severe facial pain, she took extra dose of lamotrigine, made it to 250 mg that particular day, laboratory evaluation showed mildly low potassium of CMP WAS GIVEN SUPPLEMENT, NORMAL CBC, lamotrigine level in June 15 was 15.6,  For a while, she had severe right facial pain, was taking gabapentin 300 mg 2 tablets every 2 hours, 3 tablets at night, about 11 tablets each day 3300 mg every day plus lamotrigine 100 mg twice a day,   Lamotrigine 100 mg twice a day has been very helpful, it has taking care of her long bothered right tongue sandpaper sensation. She is now taking gabapentin 300 mg 3/2/3 tablets each day, she has been off lamotrigine since February 26 2016, the sandpaper sensation on the right side of the tongue came back, she is now taking Cymbalta 30 mg 3 times a day  She has been complains 3 weeks history of urinary urgency, difficulty initiating urine, UA showed mild UTI, was treated with Cipro without improving her symptoms  UPDATE May 10 2016:YY She was evaluated by Dr. Salomon Fick in July 2017, is planning on to have gamma knife in early September, she complains of mild memory loss, difficulty handling her computer tasks sometimes, there was no family history of memory loss, she also complains of episode, right after she get out of the bed, She is on polypharmacy, now taking lamotrigine 100 mg twice a day, gabapentin 300 mg 4 in the morning, 2 at lunch time, 4 tablets at nighttime. She also has obesity, snoring, excessive  daytime fatigue and sleepiness  We have personally reviewed CT head without contrast in June 2017, no acute abnormality, MRI of the cervical in 2008, multilevel degenerative changes, most severe at C5-6, C6-7, with herniated disc, mild canal stenosis, mild cord signal changes.  Laboratory evaluation in 2017, elevated WBC 11 point 8, hemoglobin of 13, no significant abnormality on BMP  UPDATE Apr 02 2017:YY We have personally reviewed MRI of the brain in April 2018 generalized atrophy mild supratentorium small vessel disease MRI of cervical spine April, multilevel degenerative changes most severe at C6-7, mild canal stenosissignificant foraminal narrowing or signal changes,  She had a, knife by [facility] Dr. Angelene Giovanni in September 2017 she denies significant improvement, now cold air still trigger her right facial pain, bleeding from right temporal region along the right face, right jaw, electronic shooting pain, to her right lower molar region, she has intermittent left handtremor,driving,  She is now taking Lamictal 100 mg 3 tablets each day, gabapentin 600 mg 5 tablets each day, despite combination therapy, she remains symptomatic, she is able to go to work. She is on the phone 10 hours a day, mild memory loss. I reviewed laboratory evaluation in 2018, negative troponin, CBC with hemoglobin  of 11.9, CMP, creatinine of 0.84, ESR, mild elevated C reactive protein 7.8, lamotrigine 12.9, TSH 1.2 7,  UPDATE 06/26/17 [name], 68 year old female returns for follow-up with history of trigeminal neuralgia. Her facial pain is not controlled. Patient continues to have right-sided facial pain and her tongue feels like sandpaper after being on her job for 10 hours a day. She has gargled with salt water. She works at a call center.  She is currently on Lamictal 100 twice daily, Oxtellar XR 300 mg daily, gabapentin 647m 2 tabs in the a.m., 3 tabs at lunch 1-2 at  Night. She denies side effects to  the meds.She denies any falls. Tremor is stable She had Gamma knife by Dr. TSalomon Fickin September 2017. She has not followed up with him. He gabapentin level XI.2 on 04/02/2017. Lamictal level 11.3.These are therapeutic CBC and CMP in June within normal limits .She returns for reevaluation UPDATE 1/31/2019CM.  Mr. LWaldo 68year old female returns for follow-up with history of right facial pain.  When last seen her Oxtellar dose was increased with good benefit.  Her facial pain is in control.  She is also on gabapentin and Lamictal.  She denies any side effects to the medication.   She had gamma knife surgery by Dr. TSalomon Fickin September 2017 Most recent comprehensive metabolic panel with sodium level of 140.and CBC within normal limits.  She continues to work at a call center.  She returns for reevaluation  UPDATE Oct 13 2018: She started Lamotrigine xr [id number] since Jan 27th 2020, she denies rash, but has itching all over including her face, skull, she has been on lamotrigine 100 to 200 mg daily for many years, had a similar itching episode in the past,   REVIEW OF SYSTEMS: Out of a complete 14 system review of symptoms, the patient complains only of the following symptoms, and all other reviewed systems are negative  Facial pain   ALLERGIES: Allergies  Allergen Reactions  . Clindamycin Hcl Itching    itch    HOME MEDICATIONS: Outpatient Medications Prior to Visit  Medication Sig Dispense Refill  . acetaminophen (TYLENOL) 500 MG tablet Take 1,000 mg by mouth every 6 (six) hours as needed.    . gabapentin (NEURONTIN) 600 MG tablet TAKE 2 TABLETS IN AM, 3 TABLETS AT LUNCH,1-2 TABLETS AT NIGHT 630 tablet 1  . HYDROcodone-acetaminophen (NORCO/VICODIN) 5-325 MG tablet Take by mouth.    . LamoTRIgine 200 MG TB24 24 hour tablet Take 1 tablet (200 mg total) by mouth at bedtime. 30 tablet 11  . predniSONE (DELTASONE) 20 MG tablet Take 1 tablet (20 mg total) by mouth daily for 5 days. 5 tablet 0  .  Vitamin D, Ergocalciferol, (DRISDOL) 50000 units CAPS capsule Take 1 capsule (50,000 Units total) by mouth every 7 (seven) days. 12 capsule 0   No facility-administered medications prior to visit.     PAST MEDICAL HISTORY: Past Medical History:  Diagnosis Date  . Arthritis shoulders and back  . 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 Diverticulosis   . Fibromyalgia   . History of kidney stones   . History of kidney stones   . History of shingles 2012--  no residual pain  . Left ureteral calculus   . MCI (mild cognitive impairment)   . Trigeminal neuralgia RIGHT  . Urgency of urination     PAST SURGICAL HISTORY: Past Surgical History:  Procedure Laterality Date  .  CEREBRAL MICROVASCULAR DECOMPRESSION  07-05-2006   RIGHT TRIGEMINAL NERVE  . CYSTOSCOPY/RETROGRADE/URETEROSCOPY/STONE EXTRACTION WITH BASKET  07/04/2012   Procedure: CYSTOSCOPY/RETROGRADE/URETEROSCOPY/STONE EXTRACTION WITH BASKET;  Surgeon: Claybon Jabs, MD;  Location: [facility];  Service: Urology;  Laterality: Left;  . EXPLORATORY LAPAROTOMY/ RESECTION MID TO DISTAL SIGMOID AND PROXIMAL RECTUM/ END PROXIMAL SIGMOID COLOSTOMY  07-17-2006   PERFORATED DIVERTICULITIS WITH PERITONITIS  . gamma knife  05/17/2016   for trigeminal neuralgia, [facility], Dr Salomon Fick  . gamma knife  2019  . RESECTION COLOSTOMY/ CLOSURE COLOSTOMY WITH COLOPROCTOSTOMY  02-20-2007  . TONSILLECTOMY  AS CHILD  . URETEROSCOPY  07/04/2012   Procedure: URETEROSCOPY;  Surgeon: Claybon Jabs, MD;  Location: [facility];  Service: Urology;  Laterality: Left;  Marland Kitchen VAGINAL HYSTERECTOMY  1998   Partial  . WISDOM TOOTH EXTRACTION      FAMILY HISTORY: Family History  Problem Relation Age of Onset  . Heart disease Maternal Grandmother   . Diabetes Mother   . Breast cancer Maternal Aunt   . Lupus Cousin   . Colon cancer Neg Hx   . Esophageal cancer Neg Hx   . Rectal  cancer Neg Hx   . Stomach cancer Neg Hx     SOCIAL HISTORY: Social History   Socioeconomic History  . Marital status: Married    Spouse name: Not on file  . Number of children: 1  . Years of education: College  . Highest education level: Not on file  Occupational History    Employer: UNEMPLOYED  . Occupation: HUMAN RESOURCES     Employer: [employer]  Social Needs  . Financial resource strain: Not on file  . Food insecurity:    Worry: Not on file    Inability: Not on file  . Transportation needs:    Medical: Not on file    Non-medical: Not on file  Tobacco Use  . Smoking status: Never Smoker  . Smokeless tobacco: Never Used  Substance and Sexual Activity  . Alcohol use: Yes    Comment: rarely  . Drug use: No  . Sexual activity: Not on file  Lifestyle  . Physical activity:    Days per week: Not on file    Minutes per session: Not on file  . Stress: Not on file  Relationships  . Social connections:    Talks on phone: Not on file    Gets together: Not on file    Attends religious service: Not on file    Active member of club or organization: Not on file    Attends meetings of clubs or organizations: Not on file    Relationship status: Not on file  . Intimate partner violence:    Fear of current or ex partner: Not on file    Emotionally abused: Not on file    Physically abused: Not on file    Forced sexual activity: Not on file  Other Topics Concern  . Not on file  Social History Narrative  . Not on file      PHYSICAL EXAM  Vitals:   10/13/18 1140  BP: (!) 151/74  Pulse: 98  Weight: 189 lb 8 oz (86 kg)  Height: 5' 6"  (1.676 m)   Body mass index is 30.59 kg/m.  Generalized: Well developed, in no acute distress   Neurological examination  Mentation: Alert oriented to time, place, history taking. Follows all commands speech and language fluent Cranial nerve II-XII: Pupils were equal round reactive to  light. Extraocular movements were full, visual  field were full on confrontational test. Facial sensation and strength were normal. Uvula tongue midline. Head turning and shoulder shrug  were normal and symmetric. Motor: The motor testing reveals 5 over 5 strength of all 4 extremities. Good symmetric motor tone is noted throughout.  Sensory: Decreased light touch to right side of her face V1-V3. Sensory testing is intact to soft touch on all 4 extremities. No evidence of extinction is noted.  Coordination: Cerebellar testing reveals good finger-nose-finger and heel-to-shin bilaterally.  Gait and station: Gait is normal. Tandem gait is normal. Romberg is negative. No drift is seen.  Reflexes: Deep tendon reflexes are symmetric and normal bilaterally.   DIAGNOSTIC DATA (LABS, IMAGING, TESTING) - I reviewed patient records, labs, notes, testing and imaging myself where available.  Lab Results  Component Value Date   WBC 7.9 10/12/2018   HGB 10.1 (L) 10/12/2018   HCT 34.8 (L) 10/12/2018   MCV 82.9 10/12/2018   PLT 379 10/12/2018      Component Value Date/Time   NA 136 10/12/2018 2328   K 3.1 (L) 10/12/2018 2328   CL 101 10/12/2018 2328   CO2 22 10/12/2018 2328   GLUCOSE 136 (H) 10/12/2018 2328   GLUCOSE 96 08/20/2006 1433   BUN 11 10/12/2018 2328   CREATININE 0.96 10/12/2018 2328   CALCIUM 9.3 10/12/2018 2328   PROT 6.7 07/01/2018 0841   ALBUMIN 3.9 07/01/2018 0841   AST 14 07/01/2018 0841   ALT 8 07/01/2018 0841   ALKPHOS 68 07/01/2018 0841   BILITOT 0.4 07/01/2018 0841   GFRNONAA >60 10/12/2018 2328   GFRAA >60 10/12/2018 2328   Lab Results  Component Value Date   CHOL 203 (H) 07/22/2017   HDL 78.10 07/22/2017   LDLCALC 117 (H) 07/22/2017   LDLDIRECT 141.1 10/01/2013   TRIG 43.0 07/22/2017   CHOLHDL 3 07/22/2017   Lab Results  Component Value Date   HGBA1C 5.6 07/01/2018   Lab Results  Component Value Date   VITAMINB12 419 07/22/2017   Lab Results  Component Value Date   TSH 0.728 10/28/2017       ASSESSMENT AND PLAN 68 y.o. year old female   Trigeminal Neuralgia   Keep Lamictal 200 ER once at bedtime.   Gabapentin 600 mg tablets, maximum 6 tablets daily  New onset whole body itchiness  No rash broke out, does not look like allergic reaction, she has been taking lamotrigine 100 to 200 mg daily for many years, have advised her to contact her primary care, or dermatology for evaluation, Benadryl as needed  Marcial Pacas, M.D. Ph.D.  [facility] [place], [place] [address] Phone: [phone] Fax:      [phone]

## 2018-10-14 ENCOUNTER — Ambulatory Visit: Payer: Federal, State, Local not specified - PPO | Admitting: Sports Medicine

## 2018-10-14 ENCOUNTER — Encounter: Payer: Self-pay | Admitting: Sports Medicine

## 2018-10-14 ENCOUNTER — Ambulatory Visit (INDEPENDENT_AMBULATORY_CARE_PROVIDER_SITE_OTHER): Payer: Federal, State, Local not specified - PPO

## 2018-10-14 ENCOUNTER — Other Ambulatory Visit: Payer: Self-pay | Admitting: *Deleted

## 2018-10-14 VITALS — BP 117/61 | HR 91

## 2018-10-14 DIAGNOSIS — M779 Enthesopathy, unspecified: Secondary | ICD-10-CM | POA: Diagnosis not present

## 2018-10-14 DIAGNOSIS — R609 Edema, unspecified: Secondary | ICD-10-CM

## 2018-10-14 DIAGNOSIS — R252 Cramp and spasm: Secondary | ICD-10-CM

## 2018-10-14 DIAGNOSIS — M79672 Pain in left foot: Secondary | ICD-10-CM

## 2018-10-14 DIAGNOSIS — M21619 Bunion of unspecified foot: Secondary | ICD-10-CM | POA: Diagnosis not present

## 2018-10-14 DIAGNOSIS — M79671 Pain in right foot: Secondary | ICD-10-CM | POA: Diagnosis not present

## 2018-10-14 DIAGNOSIS — Z8739 Personal history of other diseases of the musculoskeletal system and connective tissue: Secondary | ICD-10-CM

## 2018-10-14 DIAGNOSIS — L299 Pruritus, unspecified: Secondary | ICD-10-CM

## 2018-10-14 MED ORDER — PREDNISONE 20 MG PO TABS: 20.0000 mg | ORAL_TABLET | Freq: Every day | ORAL | 0 refills | Status: AC

## 2018-10-14 MED ORDER — GABAPENTIN 600 MG PO TABS: ORAL_TABLET | ORAL | 3 refills | Status: DC

## 2018-10-14 NOTE — Progress Notes (Signed)
Subjective: Summer [name] is a 68 y.o. female patient who presents to office for evaluation of bilateral foot pain. Patient complains of progressive pain especially over the last year or more that consist of occasional cramping pain to both feet and pain to the tops of both feet with a little bit of swelling across the top of both feet and occasional pain to bunions that is aggravated by shoes.  Patient reports that she is even having symptoms at rest is tried Tylenol with no relief and is currently on gabapentin and other medications for fibromyalgia which have been given her much relief.  Patient denies any history of diabetes or any vascular disease.  Patient denies any injury or trauma or causative factors.  Patient states that pain is difficult to right however is concerning because the cramps at bedtime are the worst sometimes waking her up at night where she has to massage her feet and legs in order to get it to ease off.  Patient denies any calf pain any warmth any swelling in these areas or acute shortness of breath or signs or symptoms consistent with DVT.  No other pedal complaints noted.  Review of Systems  Musculoskeletal: Positive for joint pain and myalgias.  Neurological: Positive for tingling.  All other systems reviewed and are negative.    Patient Active Problem List   Diagnosis Date Noted  . Itching 10/13/2018  . Left eye pain 07/01/2018  . Constipation 07/01/2018  . Weight loss 07/22/2017  . Memory loss 12/21/2016  . Tremor 12/21/2016  . Right sided temporal headache 12/21/2016  . Trochanteric bursitis, right hip 10/26/2016  . Pruritus 10/26/2016  . Foot sprain, left, initial encounter 08/31/2016  . Pain of right side of body 08/31/2016  . Headache 04/24/2016  . LLQ pain 04/24/2016  . Glossitis 02/02/2016  . Routine general medical examination at a health care facility 10/27/2015  . Facial pain 10/06/2015  . Trigeminal neuralgia of right side of face 11/30/2010   . Morbid obesity ([place]) 11/09/2010  . Fibromyalgia 11/09/2010  . HEMATURIA, HX OF 11/14/2009  . LOC OSTEOARTHROS NOT SPEC PRIM/SEC OTH [facility] SITE 06/27/2009  . Allergic rhinitis 08/26/2008  . 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 acetaminophen (TYLENOL) 500 MG tablet Take 1,000 mg by mouth every 6 (six) hours as needed.    Marland Kitchen HYDROcodone-acetaminophen (NORCO/VICODIN) 5-325 MG tablet Take by mouth.    . LamoTRIgine 200 MG TB24 24 hour tablet Take 1 tablet (200 mg total) by mouth at bedtime. 30 tablet 11  . Vitamin D, Ergocalciferol, (DRISDOL) 50000 units CAPS capsule Take 1 capsule (50,000 Units total) by mouth every 7 (seven) days. 12 capsule 0   No current facility-administered medications on file prior to visit.     Allergies  Allergen Reactions  . Clindamycin Hcl Itching    itch    Objective:  General: Alert and oriented x3 in no acute distress  Dermatology: No open lesions bilateral lower extremities, no webspace macerations, no ecchymosis bilateral, all nails x 10 are well manicured.  Vascular: Dorsalis Pedis and Posterior Tibial pedal pulses palpable, Capillary Fill Time 3 seconds,(+) pedal hair growth bilateral, no significant edema bilateral lower extremities, Temperature gradient within normal limits.  Neurology: Johney Maine sensation intact via light touch bilateral.  Subjective tingling in both feet and especially at the left second and third toes and cramping worse at bedtime.  Musculoskeletal:  Minimal tenderness to palpation on today's exam bilateral midfoot with palpable bone spurs likely consistent with history of arthritis/pes planus and minimal pain to bunions.  Gait: Non-antalgic gait  Xrays  Left and right foot   Impression: Normal osseous mineralization, there is increased intermetatarsal angle consistent with bunion deformity there is mild lesser contracture of  the toes consistent with hammertoe mild midtarsal breech with dorsal bone spurs consistent with arthritis versus capsulitis, very minimal heel spur, soft tissue margins within normal limits.  Assessment and Plan: Problem List Items Addressed This Visit      Other   Itching    Other Visit Diagnoses    Capsulitis    -  Primary   Relevant Orders   DG Foot Complete Right   DG Foot Complete Left   Bunion       Pain in both feet       Cramp and spasm       Swelling       History of fibromyalgia           -Complete examination performed -Xrays reviewed -Discussed treatement options for foot pain -Rx bunion shield and advised patient to wear good supportive shoes that are comfortable -Advised patient gentle stretching topical pain creams or rubs rest ice elevation as needed -Dispensed prednisone for patient to take for generalized pain and also she has uses in the past for itching which she feels like has been more bothersome since her feet and legs have been more tingly -Continues with gabapentin as prescribed by primary care provider -Advised tonic water for cramps the supplements and recommend to patient if her symptoms are still present may benefit from a nerve conduction study or vascular studies to rule out any other etiologies for cramping -Patient to return to office in 4 weeks to follow-up on cramping or sooner if condition worsens.  Landis Martins, DPM

## 2018-10-14 NOTE — Patient Instructions (Signed)
Cramp ease Topical pain patch as needed  Tonic water 2 oz at bed time for cramps

## 2018-10-16 ENCOUNTER — Ambulatory Visit: Payer: Federal, State, Local not specified - PPO | Admitting: Physician Assistant

## 2018-10-16 DIAGNOSIS — M170 Bilateral primary osteoarthritis of knee: Secondary | ICD-10-CM | POA: Diagnosis not present

## 2018-10-16 MED ORDER — LIDOCAINE HCL 1 % IJ SOLN
1.5000 mL | INTRAMUSCULAR | Status: AC | PRN
  Administered 2018-10-16: 1.5 mL

## 2018-10-16 MED ORDER — HYLAN G-F 20 16 MG/2ML IX SOSY
16.0000 mg | PREFILLED_SYRINGE | INTRA_ARTICULAR | Status: AC | PRN
  Administered 2018-10-16: 16 mg via INTRA_ARTICULAR

## 2018-10-16 NOTE — Progress Notes (Signed)
   Procedure Note  Patient: Summer [name]             Date of Birth: 07-Jun-1951           MRN: [id number]             Visit Date: 10/16/2018  Procedures: Visit Diagnoses: Primary osteoarthritis of both knees Synvisc #1 bilateral B/B Large Joint Inj: bilateral knee on 10/16/2018 2:56 PM Indications: pain Details: 25 G 1.5 in needle, medial approach  Arthrogram: No  Medications (Right): 16 mg Hylan 16 MG/2ML; 1.5 mL lidocaine 1 % Aspirate (Right): 0 mL Medications (Left): 16 mg Hylan 16 MG/2ML; 1.5 mL lidocaine 1 % Aspirate (Left): 0 mL Outcome: tolerated well, no immediate complications Procedure, treatment alternatives, risks and benefits explained, specific risks discussed. Consent was given by the patient. Immediately prior to procedure a time out was called to verify the correct patient, procedure, equipment, support staff and site/side marked as required. Patient was prepped and draped in the usual sterile fashion.     Patient tolerated the procedure well.  Hazel Sams, PA-C

## 2018-10-23 ENCOUNTER — Ambulatory Visit: Payer: Federal, State, Local not specified - PPO | Admitting: Physician Assistant

## 2018-10-23 DIAGNOSIS — M170 Bilateral primary osteoarthritis of knee: Secondary | ICD-10-CM | POA: Diagnosis not present

## 2018-10-23 MED ORDER — HYLAN G-F 20 16 MG/2ML IX SOSY
16.0000 mg | PREFILLED_SYRINGE | INTRA_ARTICULAR | Status: AC | PRN
  Administered 2018-10-23: 16 mg via INTRA_ARTICULAR

## 2018-10-23 MED ORDER — LIDOCAINE HCL 1 % IJ SOLN
1.5000 mL | INTRAMUSCULAR | Status: AC | PRN
  Administered 2018-10-23: 1.5 mL

## 2018-10-23 NOTE — Progress Notes (Signed)
   Procedure Note  Patient: Summer [name]             Date of Birth: 08-14-1951           MRN: [id number]             Visit Date: 10/23/2018  Procedures: Visit Diagnoses: Primary osteoarthritis of both knees - Plan: Large Joint Inj: bilateral knee synvisc #2 bilateral B/B Large Joint Inj: bilateral knee on 10/23/2018 2:47 PM Indications: pain Details: 25 G 1.5 in needle, medial approach  Arthrogram: No  Medications (Right): 16 mg Hylan 16 MG/2ML; 1.5 mL lidocaine 1 % Aspirate (Right): 0 mL Medications (Left): 16 mg Hylan 16 MG/2ML; 1.5 mL lidocaine 1 % Aspirate (Left): 0 mL Outcome: tolerated well, no immediate complications Procedure, treatment alternatives, risks and benefits explained, specific risks discussed. Consent was given by the patient. Immediately prior to procedure a time out was called to verify the correct patient, procedure, equipment, support staff and site/side marked as required. Patient was prepped and draped in the usual sterile fashion.      Patient tolerated the procedure well.  Hazel Sams, PA-C

## 2018-10-30 ENCOUNTER — Ambulatory Visit
Admission: RE | Admit: 2018-10-30 | Discharge: 2018-10-30 | Disposition: A | Discharge status: Home / Self Care | Payer: Federal, State, Local not specified - PPO | Source: Ambulatory Visit | Attending: Internal Medicine | Admitting: Internal Medicine

## 2018-10-30 ENCOUNTER — Ambulatory Visit: Payer: Federal, State, Local not specified - PPO | Admitting: Physician Assistant

## 2018-10-30 ENCOUNTER — Encounter: Payer: Self-pay | Admitting: Internal Medicine

## 2018-10-30 DIAGNOSIS — M170 Bilateral primary osteoarthritis of knee: Secondary | ICD-10-CM

## 2018-10-30 DIAGNOSIS — Z1231 Encounter for screening mammogram for malignant neoplasm of breast: Secondary | ICD-10-CM

## 2018-10-30 MED ORDER — HYLAN G-F 20 16 MG/2ML IX SOSY
16.0000 mg | PREFILLED_SYRINGE | INTRA_ARTICULAR | Status: AC | PRN
  Administered 2018-10-30: 16 mg via INTRA_ARTICULAR

## 2018-10-30 MED ORDER — LIDOCAINE HCL 1 % IJ SOLN
1.5000 mL | INTRAMUSCULAR | Status: AC | PRN
  Administered 2018-10-30: 1.5 mL

## 2018-10-30 NOTE — Progress Notes (Signed)
   Procedure Note  Patient: Summer [name]             Date of Birth: Oct 17, 1950           MRN: [id number]             Visit Date: 10/30/2018  Procedures: Visit Diagnoses: Primary osteoarthritis of both knees - Plan: Large Joint Inj: bilateral knee Synvisc #3 Bilateral knee joint injections  Large Joint Inj: bilateral knee on 10/30/2018 1:52 PM Indications: pain Details: 25 G 1.5 in needle, medial approach  Arthrogram: No  Medications (Right): 16 mg Hylan 16 MG/2ML; 1.5 mL lidocaine 1 % Aspirate (Right): 0 mL Medications (Left): 16 mg Hylan 16 MG/2ML; 1.5 mL lidocaine 1 % Aspirate (Left): 0 mL Outcome: tolerated well, no immediate complications Procedure, treatment alternatives, risks and benefits explained, specific risks discussed. Consent was given by the patient. Immediately prior to procedure a time out was called to verify the correct patient, procedure, equipment, support staff and site/side marked as required. Patient was prepped and draped in the usual sterile fashion.      Patient tolerated the procedure well.  Hazel Sams, PA-C

## 2018-11-03 ENCOUNTER — Other Ambulatory Visit: Payer: Self-pay | Admitting: Internal Medicine

## 2018-11-03 DIAGNOSIS — R928 Other abnormal and inconclusive findings on diagnostic imaging of breast: Secondary | ICD-10-CM

## 2018-11-07 ENCOUNTER — Other Ambulatory Visit: Payer: Self-pay | Admitting: Internal Medicine

## 2018-11-07 ENCOUNTER — Ambulatory Visit
Admission: RE | Admit: 2018-11-07 | Discharge: 2018-11-07 | Disposition: A | Discharge status: Home / Self Care | Payer: Federal, State, Local not specified - PPO | Source: Ambulatory Visit | Attending: Internal Medicine | Admitting: Internal Medicine

## 2018-11-07 DIAGNOSIS — R928 Other abnormal and inconclusive findings on diagnostic imaging of breast: Secondary | ICD-10-CM

## 2018-11-07 DIAGNOSIS — N6310 Unspecified lump in the right breast, unspecified quadrant: Secondary | ICD-10-CM

## 2018-11-07 NOTE — Telephone Encounter (Signed)
Pt came in regarding this message, she states even though it says she read it she did not read it and she would like to know exactly what the urine test from the ED says, she states she does not want an appointment because the results are in the chart she just wants to know what they mean and if their is any concern, she said if everyone says we will send the results to your primary care doctor and she has not heard anything then they are obviously not being read.   Please advise and call back and she asks to please leave a voicemail on her cell phone if she does not answer.

## 2018-11-11 ENCOUNTER — Ambulatory Visit: Payer: Federal, State, Local not specified - PPO | Admitting: Sports Medicine

## 2018-11-11 ENCOUNTER — Encounter: Payer: Self-pay | Admitting: Sports Medicine

## 2018-11-11 ENCOUNTER — Ambulatory Visit
Admission: RE | Admit: 2018-11-11 | Discharge: 2018-11-11 | Disposition: A | Discharge status: Home / Self Care | Payer: Federal, State, Local not specified - PPO | Source: Ambulatory Visit | Attending: Internal Medicine | Admitting: Internal Medicine

## 2018-11-11 DIAGNOSIS — M79671 Pain in right foot: Secondary | ICD-10-CM

## 2018-11-11 DIAGNOSIS — M722 Plantar fascial fibromatosis: Secondary | ICD-10-CM | POA: Diagnosis not present

## 2018-11-11 DIAGNOSIS — N6310 Unspecified lump in the right breast, unspecified quadrant: Secondary | ICD-10-CM

## 2018-11-11 DIAGNOSIS — Z8739 Personal history of other diseases of the musculoskeletal system and connective tissue: Secondary | ICD-10-CM

## 2018-11-11 DIAGNOSIS — M79672 Pain in left foot: Secondary | ICD-10-CM

## 2018-11-11 DIAGNOSIS — M21619 Bunion of unspecified foot: Secondary | ICD-10-CM

## 2018-11-11 DIAGNOSIS — R252 Cramp and spasm: Secondary | ICD-10-CM

## 2018-11-11 DIAGNOSIS — M779 Enthesopathy, unspecified: Secondary | ICD-10-CM

## 2018-11-11 HISTORY — PX: BREAST BIOPSY: SHX20

## 2018-11-11 MED ORDER — MELOXICAM 15 MG PO TABS: 15.0000 mg | ORAL_TABLET | Freq: Every day | ORAL | 0 refills | Status: DC

## 2018-11-11 MED ORDER — TRIAMCINOLONE ACETONIDE 10 MG/ML IJ SUSP
10.0000 mg | Freq: Once | INTRAMUSCULAR | Status: AC
  Administered 2018-11-11: 10 mg

## 2018-11-11 NOTE — Progress Notes (Signed)
Subjective: Aarian Tachina Spoonemore is a 68 y.o. female patient presents to office with complaint of continued foot pain all over with cramping patient reports that the cramping is less intense however she does feel like her foot pain is not getting better admits moderate heel pain on the right and left foot.  Reports that the prednisone that I gave her last visit did not offer any relief after she completed it the pain flared back up and is as worse as it was last visit.  Patient also reports that she is getting more pain in the right foot compared to the left at the heel and also continued pain over the bunion and reports that the bunion cushion she received last visit did not offer any relief.  Patient denies any increased episodes of swelling.  Patient denies any changes with medication or history since last visit.  Denies any other pedal complaints.   Patient Active Problem List   Diagnosis Date Noted  . Itching 10/13/2018  . Left eye pain 07/01/2018  . Constipation 07/01/2018  . Weight loss 07/22/2017  . Memory loss 12/21/2016  . Tremor 12/21/2016  . Right sided temporal headache 12/21/2016  . Trochanteric bursitis, right hip 10/26/2016  . Pruritus 10/26/2016  . Foot sprain, left, initial encounter 08/31/2016  . Pain of right side of body 08/31/2016  . Headache 04/24/2016  . LLQ pain 04/24/2016  . Glossitis 02/02/2016  . Routine general medical examination at a health care facility 10/27/2015  . Facial pain 10/06/2015  . Trigeminal neuralgia of right side of face 11/30/2010  . Morbid obesity ([place]) 11/09/2010  . Fibromyalgia 11/09/2010  . HEMATURIA, HX OF 11/14/2009  . LOC OSTEOARTHROS NOT SPEC PRIM/SEC OTH [facility] SITE 06/27/2009  . Allergic rhinitis 08/26/2008  . 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 acetaminophen (TYLENOL) 500 MG tablet Take 1,000 mg by mouth every 6 (six)  hours as needed.    . DULoxetine (CYMBALTA) 30 MG capsule     . gabapentin (NEURONTIN) 600 MG tablet TAKE 2 TABLETS TID. MAX OF SIX TABS PER DAY. 540 tablet 3  . HYDROcodone-acetaminophen (NORCO/VICODIN) 5-325 MG tablet Take by mouth.    . LamoTRIgine 200 MG TB24 24 hour tablet Take 1 tablet (200 mg total) by mouth at bedtime. 30 tablet 11  . Vitamin D, Ergocalciferol, (DRISDOL) 50000 units CAPS capsule Take 1 capsule (50,000 Units total) by mouth every 7 (seven) days. 12 capsule 0   No current facility-administered medications on file prior to visit.     Allergies  Allergen Reactions  . Clindamycin Hcl Itching    itch    Objective: Physical Exam General: The patient is alert and oriented x3 in no acute distress.  Dermatology: Skin is warm, dry and supple bilateral lower extremities. Nails 1-10 are normal. There is no erythema, edema, no eccymosis, no open lesions present. Integument is otherwise unremarkable.  Vascular: Dorsalis Pedis pulse and Posterior Tibial pulse are 1/4 bilateral. Capillary fill time is immediate to all digits.  Neurological: Grossly intact to light touch with an achilles reflex of +2/5 and a  negative Tinel's sign bilateral.  Musculoskeletal: Tenderness to palpation at the medial calcaneal tubercale and through the insertion of the plantar fascia on the right greater than left foot.  Subjective diffuse pain at bunions right greater than left and tops and bottoms of feet however most tenderness noted with palpation to  plantar fascia bilateral.  Pes planus foot type bilateral.  No pain with compression of calcaneus bilateral. No pain with tuning fork to calcaneus bilateral. No pain with calf compression bilateral. There is decreased Ankle joint range of motion bilateral. All other joints range of motion within normal limits bilateral except with guarding and stiffness due to pain. Strength 5/5 in all groups bilateral.     Assessment and Plan: Problem List Items  Addressed This Visit    None    Visit Diagnoses    Plantar fasciitis, bilateral    -  Primary   Relevant Medications   triamcinolone acetonide (KENALOG) 10 MG/ML injection 10 mg (Completed) (Start on 11/12/2018 12:00 AM)   Capsulitis       Bunion       Pain in both feet       Cramp and spasm       History of fibromyalgia          -Complete examination performed.  -Previous Xrays reviewed -Discussed with patient in detail the condition of plantar fasciitis since this is the only area of localized pain that is pretty significant on today's exam with the complicating factor of fibromyalgia which can give multiple tender points in symptoms, how this occurs and general treatment options. Explained both conservative and surgical treatments.  -After oral consent and aseptic prep, injected a mixture containing 1 ml of 2%  plain lidocaine, 1 ml 0.5% plain marcaine, 0.5 ml of kenalog 10 and 0.5 ml of dexamethasone phosphate into left and right heel. Post-injection care discussed with patient.  -Rx Meloxicam -Recommended good supportive shoes and advised use of fascial braces bilateral which was dispensed at today's visit. -Recommend icing if this helps and gentle stretching -Patient to return to office in 4 weeks for follow up or sooner if problems or questions arise.  Advised patient at this time we will hold off on any vascular or nerve studies because I think that this is not a primary condition that could be causing her pain especially because her sensation is intact the cramping has decreased and her circulation appears to be palpable and normal.  Patient expressed understanding and is agreeable to my plan of care for today's visit.  Landis Martins, DPM

## 2018-11-12 ENCOUNTER — Other Ambulatory Visit: Payer: Self-pay | Admitting: Internal Medicine

## 2018-11-12 DIAGNOSIS — N63 Unspecified lump in unspecified breast: Secondary | ICD-10-CM

## 2018-11-14 ENCOUNTER — Ambulatory Visit
Admission: RE | Admit: 2018-11-14 | Discharge: 2018-11-14 | Disposition: A | Discharge status: Home / Self Care | Payer: Federal, State, Local not specified - PPO | Source: Ambulatory Visit | Attending: Internal Medicine | Admitting: Internal Medicine

## 2018-11-14 ENCOUNTER — Telehealth: Payer: Self-pay | Admitting: Hematology

## 2018-11-14 DIAGNOSIS — N63 Unspecified lump in unspecified breast: Secondary | ICD-10-CM

## 2018-11-14 NOTE — Telephone Encounter (Signed)
LVM for patient to return call in reference to afternoon [facility] appointment for 3/11, packet mailed to patient

## 2018-11-17 ENCOUNTER — Encounter: Payer: Self-pay | Admitting: *Deleted

## 2018-11-17 ENCOUNTER — Telehealth: Payer: Self-pay | Admitting: Hematology

## 2018-11-17 DIAGNOSIS — C50411 Malignant neoplasm of upper-outer quadrant of right female breast: Secondary | ICD-10-CM

## 2018-11-17 DIAGNOSIS — Z170 Estrogen receptor positive status [ER+]: Secondary | ICD-10-CM

## 2018-11-17 HISTORY — DX: Malignant neoplasm of upper-outer quadrant of right female breast: C50.411

## 2018-11-17 HISTORY — DX: Estrogen receptor positive status (ER+): Z17.0

## 2018-11-17 NOTE — Telephone Encounter (Signed)
Spoke with patient to confirm afternoon [facility] appointment for 3/11, packet mailed to patient

## 2018-11-19 ENCOUNTER — Ambulatory Visit: Payer: Self-pay | Admitting: General Surgery

## 2018-11-19 ENCOUNTER — Ambulatory Visit: Payer: Federal, State, Local not specified - PPO | Admitting: Physical Therapy

## 2018-11-19 ENCOUNTER — Inpatient Hospital Stay: Payer: Federal, State, Local not specified - PPO

## 2018-11-19 ENCOUNTER — Encounter: Payer: Self-pay | Admitting: Physical Therapy

## 2018-11-19 ENCOUNTER — Other Ambulatory Visit: Payer: Self-pay

## 2018-11-19 ENCOUNTER — Telehealth: Payer: Self-pay | Admitting: Hematology

## 2018-11-19 ENCOUNTER — Encounter: Payer: Self-pay | Admitting: Hematology

## 2018-11-19 ENCOUNTER — Inpatient Hospital Stay: Payer: Federal, State, Local not specified - PPO | Attending: Hematology | Admitting: Hematology

## 2018-11-19 ENCOUNTER — Ambulatory Visit
Admission: RE | Admit: 2018-11-19 | Discharge: 2018-11-19 | Disposition: A | Discharge status: Home / Self Care | Payer: Federal, State, Local not specified - PPO | Source: Ambulatory Visit | Attending: Radiation Oncology | Admitting: Radiation Oncology

## 2018-11-19 VITALS — BP 135/50 | HR 82 | Temp 99.0°F | Resp 18 | Ht 66.0 in | Wt 188.4 lb

## 2018-11-19 DIAGNOSIS — Z9049 Acquired absence of other specified parts of digestive tract: Secondary | ICD-10-CM

## 2018-11-19 DIAGNOSIS — D649 Anemia, unspecified: Secondary | ICD-10-CM | POA: Insufficient documentation

## 2018-11-19 DIAGNOSIS — R293 Abnormal posture: Secondary | ICD-10-CM

## 2018-11-19 DIAGNOSIS — G500 Trigeminal neuralgia: Secondary | ICD-10-CM | POA: Insufficient documentation

## 2018-11-19 DIAGNOSIS — Z170 Estrogen receptor positive status [ER+]: Secondary | ICD-10-CM

## 2018-11-19 DIAGNOSIS — C50411 Malignant neoplasm of upper-outer quadrant of right female breast: Secondary | ICD-10-CM

## 2018-11-19 DIAGNOSIS — Z791 Long term (current) use of non-steroidal anti-inflammatories (NSAID): Secondary | ICD-10-CM | POA: Insufficient documentation

## 2018-11-19 DIAGNOSIS — Z90710 Acquired absence of both cervix and uterus: Secondary | ICD-10-CM | POA: Diagnosis not present

## 2018-11-19 DIAGNOSIS — Z79899 Other long term (current) drug therapy: Secondary | ICD-10-CM | POA: Insufficient documentation

## 2018-11-19 DIAGNOSIS — Z90722 Acquired absence of ovaries, bilateral: Secondary | ICD-10-CM | POA: Diagnosis not present

## 2018-11-19 DIAGNOSIS — C50511 Malignant neoplasm of lower-outer quadrant of right female breast: Secondary | ICD-10-CM

## 2018-11-19 DIAGNOSIS — C50911 Malignant neoplasm of unspecified site of right female breast: Secondary | ICD-10-CM

## 2018-11-19 DIAGNOSIS — Z9079 Acquired absence of other genital organ(s): Secondary | ICD-10-CM

## 2018-11-19 LAB — CBC WITH DIFFERENTIAL (CANCER CENTER ONLY)
Abs Immature Granulocytes: 0.01 10*3/uL (ref 0.00–0.07)
Basophils Absolute: 0 10*3/uL (ref 0.0–0.1)
Basophils Relative: 1 %
Eosinophils Absolute: 0.1 10*3/uL (ref 0.0–0.5)
Eosinophils Relative: 2 %
HCT: 31.2 % — ABNORMAL LOW (ref 36.0–46.0)
Hemoglobin: 9 g/dL — ABNORMAL LOW (ref 12.0–15.0)
Immature Granulocytes: 0 %
Lymphocytes Relative: 31 %
Lymphs Abs: 1.9 10*3/uL (ref 0.7–4.0)
MCH: 23.5 pg — ABNORMAL LOW (ref 26.0–34.0)
MCHC: 28.8 g/dL — ABNORMAL LOW (ref 30.0–36.0)
MCV: 81.5 fL (ref 80.0–100.0)
Monocytes Absolute: 0.5 10*3/uL (ref 0.1–1.0)
Monocytes Relative: 7 %
Neutro Abs: 3.7 10*3/uL (ref 1.7–7.7)
Neutrophils Relative %: 59 %
Platelet Count: 374 10*3/uL (ref 150–400)
RBC: 3.83 MIL/uL — ABNORMAL LOW (ref 3.87–5.11)
RDW: 16 % — ABNORMAL HIGH (ref 11.5–15.5)
WBC Count: 6.2 10*3/uL (ref 4.0–10.5)
nRBC: 0 % (ref 0.0–0.2)

## 2018-11-19 LAB — RETIC PANEL
Immature Retic Fract: 26.7 % — ABNORMAL HIGH (ref 2.3–15.9)
RBC.: 3.82 MIL/uL — ABNORMAL LOW (ref 3.87–5.11)
Retic Count, Absolute: 52 10*3/uL (ref 19.0–186.0)
Retic Ct Pct: 1.4 % (ref 0.4–3.1)
Reticulocyte Hemoglobin: 25.1 pg — ABNORMAL LOW (ref >27.9)

## 2018-11-19 LAB — CMP (CANCER CENTER ONLY)
ALT: 9 U/L (ref 0–44)
AST: 17 U/L (ref 15–41)
Albumin: 3.8 g/dL (ref 3.5–5.0)
Alkaline Phosphatase: 73 U/L (ref 38–126)
Anion gap: 11 (ref 5–15)
BUN: 16 mg/dL (ref 8–23)
CO2: 24 mmol/L (ref 22–32)
Calcium: 9 mg/dL (ref 8.9–10.3)
Chloride: 106 mmol/L (ref 98–111)
Creatinine: 0.82 mg/dL (ref 0.44–1.00)
GFR, Est AFR Am: >60 mL/min (ref >60)
GFR, Estimated: >60 mL/min (ref >60)
Glucose, Bld: 83 mg/dL (ref 70–99)
Potassium: 4.1 mmol/L (ref 3.5–5.1)
Sodium: 141 mmol/L (ref 135–145)
Total Bilirubin: 0.6 mg/dL (ref 0.3–1.2)
Total Protein: 7.2 g/dL (ref 6.5–8.1)

## 2018-11-19 NOTE — Therapy (Signed)
[place], [place], [address] Phone: [phone]   Fax:  [phone]  Physical Therapy Evaluation  Patient Details  Name: Summer [name] MRN: [id number] Date of Birth: June 29, 1951 Referring Provider (PT): Dr. Excell Seltzer   Encounter Date: 11/19/2018  PT End of Session - 11/19/18 2019    Visit Number  1    Number of Visits  2    Date for PT Re-Evaluation  01/14/19    PT Start Time  3016    PT Stop Time  0109   Also saw pt from 1510-1516 for a total of 25 minutes   PT Time Calculation (min)  19 min    Activity Tolerance  Patient tolerated treatment well    Behavior During Therapy  [facility] for tasks assessed/performed       Past Medical History:  Diagnosis Date  . Arthritis shoulders and back  . 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 Diverticulosis   . Fibromyalgia   . History of kidney stones   . History of kidney stones   . History of shingles 2012--  no residual pain  . Left ureteral calculus   . MCI (mild cognitive impairment)   . Trigeminal neuralgia RIGHT  . Urgency of urination     Past Surgical History:  Procedure Laterality Date  . CEREBRAL MICROVASCULAR DECOMPRESSION  07-05-2006   RIGHT TRIGEMINAL NERVE  . CYSTOSCOPY/RETROGRADE/URETEROSCOPY/STONE EXTRACTION WITH BASKET  07/04/2012   Procedure: CYSTOSCOPY/RETROGRADE/URETEROSCOPY/STONE EXTRACTION WITH BASKET;  Surgeon: Claybon Jabs, MD;  Location: [facility];  Service: Urology;  Laterality: Left;  . EXPLORATORY LAPAROTOMY/ RESECTION MID TO DISTAL SIGMOID AND PROXIMAL RECTUM/ END PROXIMAL SIGMOID COLOSTOMY  07-17-2006   PERFORATED DIVERTICULITIS WITH PERITONITIS  . gamma knife  05/17/2016   for trigeminal neuralgia, [facility], Dr Salomon Fick  . gamma knife  2019  . RESECTION COLOSTOMY/ CLOSURE COLOSTOMY WITH COLOPROCTOSTOMY  02-20-2007  . TONSILLECTOMY  AS  CHILD  . URETEROSCOPY  07/04/2012   Procedure: URETEROSCOPY;  Surgeon: Claybon Jabs, MD;  Location: [facility];  Service: Urology;  Laterality: Left;  Marland Kitchen VAGINAL HYSTERECTOMY  1998   Partial  . WISDOM TOOTH EXTRACTION      There were no vitals filed for this visit.   Subjective Assessment - 11/19/18 2003    Subjective  Patient reports she is here today to be seen by her medical team for her newly diagnosed right breast cancer.    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 It measures 1 cm and is located in the lower outer quadrant. It is triple negative with a Ki67 of 20%. She also has trigeminal neuralgia.    Patient Stated Goals  Reduce lymphedema risk and learn post op shoulder ROM HEP    Currently in Pain?  No/denies         [facility] PT Assessment - 11/19/18 0001      Assessment   Medical Diagnosis  Right breast cancer    Referring Provider (PT)  Dr. Excell Seltzer    Onset Date/Surgical Date  10/30/18    Hand Dominance  Right    Prior Therapy  none      Precautions   Precautions  Other (comment)    Precaution Comments  active cancer      Restrictions   Weight Bearing Restrictions  No  Balance Screen   Has the patient fallen in the past 6 months  No    Has the patient had a decrease in activity level because of a fear of falling?   No    Is the patient reluctant to leave their home because of a fear of falling?   No      Home Environment   Living Environment  Private residence    [place]   She lives with her 66 y.o. son   Available Help at Discharge  Family      Prior Function   Level of Independence  Independent    Vocation  Full time employment    Development worker, international aid    Leisure  She does not exercise      Cognition   Overall Cognitive Status  Within Functional Limits for tasks assessed      Posture/Postural Control    Posture/Postural Control  Postural limitations    Postural Limitations  Rounded Shoulders;Forward head      ROM / Strength   AROM / PROM / Strength  AROM;Strength      AROM   AROM Assessment Site  Shoulder;Cervical    Right/Left Shoulder  Right;Left    Right Shoulder Extension  52 Degrees    Right Shoulder Flexion  139 Degrees    Right Shoulder ABduction  160 Degrees    Right Shoulder Internal Rotation  63 Degrees    Right Shoulder External Rotation  65 Degrees    Left Shoulder Extension  43 Degrees    Left Shoulder Flexion  138 Degrees    Left Shoulder ABduction  158 Degrees    Left Shoulder Internal Rotation  54 Degrees    Left Shoulder External Rotation  63 Degrees    Cervical Flexion  WNL    Cervical Extension  WNL    Cervical - Right Side Bend  50% limited    Cervical - Left Side Bend  50% limited    Cervical - Right Rotation  WNL    Cervical - Left Rotation  25% limited      Strength   Overall Strength  Within functional limits for tasks performed        LYMPHEDEMA/ONCOLOGY QUESTIONNAIRE - 11/19/18 2016      Type   Cancer Type  Right breast cancer      Lymphedema Assessments   Lymphedema Assessments  Upper extremities      Right Upper Extremity Lymphedema   10 cm Proximal to Olecranon Process  29.2 cm    Olecranon Process  23.1 cm    10 cm Proximal to Ulnar Styloid Process  22 cm    Just Proximal to Ulnar Styloid Process  15.5 cm    Across Hand at [employer]  18.3 cm    At [place] of 2nd Digit  6.2 cm      Left Upper Extremity Lymphedema   10 cm Proximal to Olecranon Process  27.9 cm    Olecranon Process  23 cm    10 cm Proximal to Ulnar Styloid Process  20.8 cm    Just Proximal to Ulnar Styloid Process  15.2 cm    Across Hand at [employer]  18 cm    At [place] of 2nd Digit  6 cm          Quick Dash - 11/19/18 0001    Open a tight or new jar  No difficulty    Do  heavy household chores (wash walls, wash floors)  No difficulty    Carry a  shopping bag or briefcase  No difficulty    Wash your back  No difficulty    Use a knife to cut food  No difficulty    Recreational activities in which you take some force or impact through your arm, shoulder, or hand (golf, hammering, tennis)  No difficulty    During the past week, to what extent has your arm, shoulder or hand problem interfered with your normal social activities with family, friends, neighbors, or groups?  Not at all    During the past week, to what extent has your arm, shoulder or hand problem limited your work or other regular daily activities  Not at all    Arm, shoulder, or hand pain.  None    Tingling (pins and needles) in your arm, shoulder, or hand  None    Difficulty Sleeping  No difficulty    DASH Score  0 %        Objective measurements completed on examination: See above findings.       Patient was instructed today in a home exercise program today for post op shoulder range of motion. These included active assist shoulder flexion in sitting, scapular retraction, wall walking with shoulder abduction, and hands behind head external rotation.  She was encouraged to do these twice a day, holding 3 seconds and repeating 5 times when permitted by her physician.           PT Education - 11/19/18 2018    Education Details  Lymphedema risk reduction and post op shoulder ROM HEP    Person(s) Educated  Patient    Methods  Explanation;Demonstration;Handout    Comprehension  Returned demonstration;Verbalized understanding          PT Long Term Goals - 11/19/18 2025      PT LONG TERM GOAL #1   Title  Patient will demonstrate she has regained full shoulder ROM and function post operaitvely compared to baselines.    Time  [facility] Goals - 11/19/18 2024      Patient will be able to verbalize understanding of pertinent lymphedema risk reduction practices relevant to her diagnosis specifically related to skin  care.   Time  1    Period  Days    Status  Achieved      Patient will be able to return demonstrate and/or verbalize understanding of the post-op home exercise program related to regaining shoulder range of motion.   Time  1    Period  Days    Status  Achieved      Patient will be able to verbalize understanding of the importance of attending the postoperative After Breast Cancer Class for further lymphedema risk reduction education and therapeutic exercise.   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 It measures 1 cm and is located in the lower outer quadrant. It is triple negative with a Ki67 of 20%. She also has trigeminal neuralgia. Her multidisciplinary medical team met prior to her assessments to determine a recommended treatment plan. She is planning to have  a right lumpectomy and sentinel node biopsy followed by chemotherapy, radiation, and anti-estrogen therapy. She will benefit from a post op PT assessment to determine needs.    Stability/Clinical Decision Making  Stable/Uncomplicated    Rehab Potential  Excellent    PT Frequency  --   Eval and 1 f/u visit   PT Treatment/Interventions  ADLs/Self Care Home Management;Patient/family education;Therapeutic exercise    PT Next Visit Plan  Will reassess patient 3-4 weeks post op to determine needs    PT Home Exercise Plan  Post op shoulder ROM HEP    Consulted and Agree with Plan of Care  Patient       Patient will benefit from skilled therapeutic intervention in order to improve the following deficits and impairments:  Decreased range of motion, Impaired UE functional use, Pain, Decreased knowledge of precautions, Postural dysfunction  Visit Diagnosis: Malignant neoplasm of lower-outer quadrant of right breast of female, estrogen receptor positive ([place]) -  Plan: PT plan of care cert/re-cert  Abnormal posture - Plan: PT plan of care cert/re-cert   Patient will follow up at outpatient cancer rehab 3-4 weeks following surgery.  If the patient requires physical therapy at that time, a specific plan will be dictated and sent to the referring physician for approval. The patient was educated today on appropriate basic range of motion exercises to begin post operatively and the importance of attending the After Breast Cancer class following surgery.  Patient was educated today on lymphedema risk reduction practices as it pertains to recommendations that will benefit the patient immediately following surgery.  She verbalized good understanding.     Problem List Patient Active Problem List   Diagnosis Date Noted  . Malignant neoplasm of upper-outer quadrant of right breast in female, estrogen receptor positive ([place]) 11/17/2018  . Itching 10/13/2018  . Left eye pain 07/01/2018  . Constipation 07/01/2018  . Weight loss 07/22/2017  . Memory loss 12/21/2016  . Tremor 12/21/2016  . Right sided temporal headache 12/21/2016  . Trochanteric bursitis, right hip 10/26/2016  . Pruritus 10/26/2016  . Foot sprain, left, initial encounter 08/31/2016  . Pain of right side of body 08/31/2016  . Headache 04/24/2016  . LLQ pain 04/24/2016  . Glossitis 02/02/2016  . Routine general medical examination at a health care facility 10/27/2015  . Facial pain 10/06/2015  . Trigeminal neuralgia of right side of face 11/30/2010  . Morbid obesity ([place]) 11/09/2010  . Fibromyalgia 11/09/2010  . HEMATURIA, HX OF 11/14/2009  . LOC OSTEOARTHROS NOT SPEC PRIM/SEC OTH [facility] SITE 06/27/2009  . Allergic rhinitis 08/26/2008  . 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

## 2018-11-19 NOTE — Telephone Encounter (Signed)
No los per 3/11. °

## 2018-11-19 NOTE — Progress Notes (Signed)
Radiation Oncology         ([phone]) [phone] ________________________________  Multidisciplinary Breast Oncology Clinic [facility]) Initial Outpatient Consultation  Name: Summer [name] MRN: [id number]  Date: 11/19/2018  DOB: [birth year]  MV:EHMCNOBS, Real Cons, MD  Excell Seltzer, MD   REFERRING PHYSICIAN: Excell Seltzer, MD  DIAGNOSIS: The encounter diagnosis was Malignant neoplasm of upper-outer quadrant of right breast in female, estrogen receptor positive ([place]).  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 Malignant neoplasm of upper-outer quadrant of right breast in female, estrogen receptor positive ([place]) C50.411    Z17.0     HISTORY OF PRESENT ILLNESS::Summer [name] is a 68 y.o. female who is presenting to the office today for evaluation of her newly diagnosed breast cancer. She is doing well overall.   She had routine screening mammography on 10/30/18 showing a possible abnormality in the right breast. She underwent bilateral diagnostic mammography with tomography and right breast ultrasonography at [facility] on 11/07/18 showing: 1 cm hypoechoic mass with distortion in the UPPER-OUTER RIGHT breast, corresponding to the mammographic abnormality. One RIGHT axillary lymph node with borderline cortical thickness. Tissue sampling of the RIGHT breast mass and borderline RIGHT axillary lymph node recommended.  Biopsy on 11/11/18 showed: invasive ductal carcinoma grade II/III. Prognostic indicators significant for: estrogen receptor, 95% positive and progesterone receptor, 5% positive, both with strong staining intensity. Proliferation marker Ki67 at 20%. HER2 positive.  Menarche: 68 years old Age at first live birth: not provided GP: GxP1 LMP: not provided Contraceptive: No, hysterectomy in 1998 HRT: No   The patient was referred today for presentation in the multidisciplinary conference.   Radiology studies and pathology slides were presented there for review and discussion of treatment options.  A consensus was discussed regarding potential next steps.  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    PAST SURGICAL HISTORY: Past Surgical History:  Procedure Laterality Date   CEREBRAL MICROVASCULAR DECOMPRESSION  07-05-2006   RIGHT TRIGEMINAL NERVE   CYSTOSCOPY/RETROGRADE/URETEROSCOPY/STONE EXTRACTION WITH BASKET  07/04/2012   Procedure: CYSTOSCOPY/RETROGRADE/URETEROSCOPY/STONE EXTRACTION WITH BASKET;  Surgeon: Claybon Jabs, MD;  Location: [facility];  Service: Urology;  Laterality: Left;   EXPLORATORY LAPAROTOMY/ RESECTION MID TO DISTAL SIGMOID AND PROXIMAL RECTUM/ END PROXIMAL SIGMOID COLOSTOMY  07-17-2006   PERFORATED DIVERTICULITIS WITH PERITONITIS   gamma knife  05/17/2016   for trigeminal neuralgia, [facility], Dr Salomon Fick   gamma knife  2019   RESECTION COLOSTOMY/ CLOSURE COLOSTOMY WITH COLOPROCTOSTOMY  02-20-2007   TONSILLECTOMY  AS CHILD   URETEROSCOPY  07/04/2012   Procedure: URETEROSCOPY;  Surgeon: Claybon Jabs, MD;  Location: [facility];  Service: Urology;  Laterality: Left;   VAGINAL HYSTERECTOMY  1998   Partial   WISDOM TOOTH EXTRACTION      FAMILY HISTORY: family history includes Breast cancer in her maternal aunt; Diabetes in her mother; Heart disease in her maternal grandmother; Lupus in her cousin.  SOCIAL HISTORY:  reports that she has never smoked.  She has never  used smokeless tobacco. She reports current alcohol use. She reports that she does not use drugs.  ALLERGIES: Clindamycin hcl  MEDICATIONS:  Current Outpatient Medications  Medication Sig Dispense Refill   acetaminophen (TYLENOL) 500 MG tablet Take 1,000 mg by mouth every 6 (six) hours as needed.     DULoxetine (CYMBALTA) 30 MG capsule      gabapentin (NEURONTIN) 600 MG tablet TAKE 2 TABLETS TID. MAX OF SIX TABS PER DAY. 540 tablet 3   HYDROcodone-acetaminophen (NORCO/VICODIN) 5-325 MG tablet Take by mouth.     LamoTRIgine 200 MG TB24 24 hour tablet Take 1 tablet (200 mg total) by mouth at bedtime. 30 tablet 11   meloxicam (MOBIC) 15 MG tablet Take 1 tablet (15 mg total) by mouth daily. 30 tablet 0   Vitamin D, Ergocalciferol, (DRISDOL) 50000 units CAPS capsule Take 1 capsule (50,000 Units total) by mouth every 7 (seven) days. 12 capsule 0   No current facility-administered medications for this encounter.     REVIEW OF SYSTEMS:  REVIEW OF SYSTEMS: A 10+ POINT REVIEW OF SYSTEMS WAS OBTAINED including neurology, dermatology, psychiatry, cardiac, respiratory, lymph, extremities, GI, GU, musculoskeletal, constitutional, reproductive, HEENT.On the provided form, she reports night sweats, weight change, fatigue, wearing glasses, eye pain, sleeping propped on 3-4 pillows, dribbling urine, arthritis, and anemia. She denies any other symptoms.    PHYSICAL EXAM:  Vitals with BMI 11/19/2018  Height 5' 6"  Weight 188 lbs 6 oz  BMI 64.40  Systolic 347  Diastolic 50  Pulse 82  Respirations 18  Lungs are clear to auscultation bilaterally. Heart has regular rate and rhythm. No palpable cervical, supraclavicular, or axillary adenopathy. Abdomen soft, non-tender, normal bowel sounds. Breast: Left breast large and pendulous with no palpable mass, nipple discharge, or bleeding. Right breast large and pendulous with palpable induration in the lateral aspect. No nipple discharge or  bleeding.    ECOG = 1  0 - Asymptomatic (Fully active, able to carry on all predisease activities without restriction)  1 - Symptomatic but completely ambulatory (Restricted in physically strenuous activity but ambulatory and able to carry out work of a light or sedentary nature. For example, light housework, office work)  2 - Symptomatic, <50% in bed during the day (Ambulatory and capable of all self care but unable to carry out any work activities. Up and about more than 50% of waking hours)  3 - Symptomatic, >50% in bed, but not bedbound (Capable of only limited self-care, confined to bed or chair 50% or more of waking hours)  4 - Bedbound (Completely disabled. Cannot carry on any self-care. Totally confined to bed or chair)  5 - Death   Eustace Pen MM, Creech RH, Tormey DC, et al. [phone]). "Toxicity and response criteria of the [facility]". [place] Oncol. 5 (6): 649-55  LABORATORY DATA:  Lab Results  Component Value Date   WBC 6.2 11/19/2018   HGB 9.0 (L) 11/19/2018   HCT 31.2 (L) 11/19/2018   MCV 81.5 11/19/2018   PLT 374 11/19/2018   Lab Results  Component Value Date   NA 141 11/19/2018   K 4.1 11/19/2018   CL 106 11/19/2018   CO2 24 11/19/2018   Lab Results  Component Value Date   ALT 9 11/19/2018   AST 17 11/19/2018   ALKPHOS 73 11/19/2018   BILITOT 0.6 11/19/2018    PULMONARY FUNCTION TEST:   Recent Review Flowsheet Data    There is no flowsheet data to display.  RADIOGRAPHY: Us Breast Ltd Uni Right Inc Axilla ° °Addendum Date: 11/14/2018   °ADDENDUM REPORT: 11/14/2018 15:48 ADDENDUM: Patient returned today for biopsy of a single borderline thickened lymph node in the RIGHT axilla. I could not reproduce a morphologically abnormal lymph node today, possibly resolved in the interval. Today, only normal appearing lymph nodes were identified in the RIGHT axilla. As such, the axillary ultrasound-guided biopsy was canceled. Recommendation:  Treatment plan for patient's known (recently diagnosed) RIGHT breast carcinoma. Electronically Signed   By: Stan  Maynard M.D.   On: 11/14/2018 15:48  ° °Result Date: 11/14/2018 °CLINICAL DATA:  67-year-old female for evaluation of possible RIGHT breast distortion on screening mammogram. EXAM: DIGITAL DIAGNOSTIC RIGHT MAMMOGRAM WITH TOMO ULTRASOUND RIGHT BREAST COMPARISON:  Previous exam(s). ACR Breast Density Category b: There are scattered areas of fibroglandular density. FINDINGS: 2D/3D spot compression views of the RIGHT breast demonstrate persistent distortion within the OUTER RIGHT breast. Mammographic images were processed with CAD. Targeted ultrasound is performed, showing a 1 x 0.7 x 0.6 cm hypoechoic mass with distortion at the 9-9:30 position of the RIGHT breast 6 cm from the nipple, corresponding to the mammographic distortion. A single RIGHT axillary lymph node with borderline cortical thickness of 3 mm is noted. IMPRESSION: 1. 1 cm hypoechoic mass with distortion in the UPPER-OUTER RIGHT breast, corresponding to the mammographic abnormality. One RIGHT axillary lymph node with borderline cortical thickness. Tissue sampling of the RIGHT breast mass and borderline RIGHT axillary lymph node recommended. RECOMMENDATION: Ultrasound-guided biopsies of RIGHT breast mass and RIGHT axillary lymph node, which has been scheduled for 11/11/2018. I have discussed the findings and recommendations with the patient. Results were also provided in writing at the conclusion of the visit. If applicable, a reminder letter will be sent to the patient regarding the next appointment. BI-RADS CATEGORY  4: Suspicious. Electronically Signed: By: Jeffrey  Hu M.D. On: 11/07/2018 11:25  ° °Mm Diag Breast Tomo Uni Right ° °Addendum Date: 11/14/2018   °ADDENDUM REPORT: 11/14/2018 15:48 ADDENDUM: Patient returned today for biopsy of a single borderline thickened lymph node in the RIGHT axilla. I could not reproduce a morphologically  abnormal lymph node today, possibly resolved in the interval. Today, only normal appearing lymph nodes were identified in the RIGHT axilla. As such, the axillary ultrasound-guided biopsy was canceled. Recommendation: Treatment plan for patient's known (recently diagnosed) RIGHT breast carcinoma. Electronically Signed   By: Stan  Maynard M.D.   On: 11/14/2018 15:48  ° °Result Date: 11/14/2018 °CLINICAL DATA:  67-year-old female for evaluation of possible RIGHT breast distortion on screening mammogram. EXAM: DIGITAL DIAGNOSTIC RIGHT MAMMOGRAM WITH TOMO ULTRASOUND RIGHT BREAST COMPARISON:  Previous exam(s). ACR Breast Density Category b: There are scattered areas of fibroglandular density. FINDINGS: 2D/3D spot compression views of the RIGHT breast demonstrate persistent distortion within the OUTER RIGHT breast. Mammographic images were processed with CAD. Targeted ultrasound is performed, showing a 1 x 0.7 x 0.6 cm hypoechoic mass with distortion at the 9-9:30 position of the RIGHT breast 6 cm from the nipple, corresponding to the mammographic distortion. A single RIGHT axillary lymph node with borderline cortical thickness of 3 mm is noted. IMPRESSION: 1. 1 cm hypoechoic mass with distortion in the UPPER-OUTER RIGHT breast, corresponding to the mammographic abnormality. One RIGHT axillary lymph node with borderline cortical thickness. Tissue sampling of the RIGHT breast mass and borderline RIGHT axillary lymph node recommended. RECOMMENDATION: Ultrasound-guided biopsies of RIGHT breast mass and RIGHT axillary lymph node, which has been scheduled for 11/11/2018. I have   discussed the findings and recommendations with the patient. Results were also provided in writing at the conclusion of the visit. If applicable, a reminder letter will be sent to the patient regarding the next appointment. BI-RADS CATEGORY  4: Suspicious. Electronically Signed: By: Jeffrey  Hu M.D. On: 11/07/2018 11:25  ° °Mm 3d Screen Breast  Bilateral ° °Result Date: 10/31/2018 °CLINICAL DATA:  Screening. EXAM: DIGITAL SCREENING BILATERAL MAMMOGRAM WITH TOMO AND CAD COMPARISON:  Previous exam(s). ACR Breast Density Category b: There are scattered areas of fibroglandular density. FINDINGS: In the right breast, possible distortion warrants further evaluation. In the left breast, no findings suspicious for malignancy. Images were processed with CAD. IMPRESSION: Further evaluation is suggested for possible distortion in the right breast. RECOMMENDATION: Diagnostic mammogram and possibly ultrasound of the right breast. (Code:[id number]) The patient will be contacted regarding the findings, and additional imaging will be scheduled. BI-RADS CATEGORY  0: Incomplete. Need additional imaging evaluation and/or prior mammograms for comparison. Electronically Signed   By: Michelle  Collins M.D.   On: 10/31/2018 13:26  ° °Mm Clip Placement Right ° °Result Date: 11/11/2018 °CLINICAL DATA:  Evaluate ultrasound-guided RIGHT clip placement breast biopsy following. EXAM: DIAGNOSTIC RIGHT MAMMOGRAM POST ULTRASOUND BIOPSY COMPARISON:  Previous exam(s). FINDINGS: Mammographic images were obtained following ultrasound guided biopsy of the 1 cm mass at the 9:30 position of the RIGHT breast. The RIBBON clip is in satisfactory position and corresponds to the mammographic distortion. IMPRESSION: Satisfactory RIBBON clip position following ultrasound-guided RIGHT breast biopsy. The 1 cm mass biopsied corresponds to the mammographic distortion. Final Assessment: Post Procedure Mammograms for Marker Placement Electronically Signed   By: Jeffrey  Hu M.D.   On: 11/11/2018 10:21  ° °Us Rt Breast Bx W Loc Dev 1st Lesion Img Bx Spec Us Guide ° °Addendum Date: 11/14/2018   °ADDENDUM REPORT: 11/14/2018 15:47 ADDENDUM: Patient returned today for biopsy of a single borderline thickened lymph node in the RIGHT axilla. I could not reproduce a morphologically abnormal lymph node today, possibly  resolved in the interval. Today, only normal appearing lymph nodes were identified in the RIGHT axilla. As such, the axillary ultrasound-guided biopsy was canceled. Recommendation: Treatment plan for patient's known (recently diagnosed) RIGHT breast carcinoma. Electronically Signed   By: Stan  Maynard M.D.   On: 11/14/2018 15:47  ° °Addendum Date: 11/12/2018   °ADDENDUM REPORT: 11/12/2018 17:01 ADDENDUM: Pathology revealed INVASIVE DUCTAL CARCINOMA, GRADE II/III of the RIGHT breast, upper outer-9:30 o'clock position. This was found to be concordant by Dr. Jeff Hu. Pathology results were discussed with the patient by telephone. The patient reported doing well after the biopsy with tenderness at the site. Post biopsy instructions and care were reviewed and questions were answered. The patient was encouraged to call [facility] for any additional concerns. The patient was referred to [facility] on November 19, 2018. The patient is scheduled for RIGHT axillary node biopsy November 14, 2018. Pathology results reported by Susan Eaton, RN on 11/12/2018. Electronically Signed   By: Jeffrey  Hu M.D.   On: 11/12/2018 17:01  ° °Result Date: 11/14/2018 °CLINICAL DATA:  67-year-old female for tissue sampling of 1 cm mass/distortion within the UPPER-OUTER RIGHT breast. EXAM: ULTRASOUND GUIDED RIGHT BREAST CORE NEEDLE BIOPSY COMPARISON:  Previous exam(s). FINDINGS: I met with the patient and we discussed the procedure of ultrasound-guided biopsy, including benefits and alternatives. We discussed the high likelihood of a successful procedure. We discussed the risks   of the procedure, including infection, bleeding, tissue injury, clip migration, and inadequate sampling. Informed written consent was given. The usual time-out protocol was performed immediately prior to the procedure. Lesion quadrant: UPPER-OUTER RIGHT breast Using sterile  technique and 1% Lidocaine as local anesthetic, under direct ultrasound visualization, a 12 gauge spring-loaded device was used to perform biopsy of the 1 cm irregular hypoechoic mass at the 9:30 position of the RIGHT breast using a LATERAL approach. At the conclusion of the procedure a RIBBON tissue marker clip was deployed into the biopsy cavity. Follow up 2 view mammogram was performed and dictated separately. The follow-up mammogram demonstrated that this biopsy 1 cm mass biopsied corresponds to the distortion identified mammographically. IMPRESSION: Ultrasound guided biopsy of 1 cm UPPER-OUTER RIGHT breast mass. No apparent complications. Electronically Signed: By: Jeffrey  Hu M.D. 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 We discussed the general course of radiation, potential side effects, and toxicities with radiation and the patient is interested in this approach. She does not wish to consider a mastectomy.  ° ° °PLAN:  °1. Right lumpectomy with SLN °2. Port °3. Adjuvant chemotherapy °4. XRT °5. AI °  °------------------------------------------------ ° ° D. , PhD, MD °This document serves as a record of services personally performed by  , MD. It was created on his behalf by Mary-Margaret Crabb, a trained medical scribe. The creation of this record is based on the scribe's personal observations and the provider's statements to them. This document has been checked and approved by the attending provider. ° °

## 2018-11-19 NOTE — Progress Notes (Signed)
[place]   [phone] Fax:([phone]) [place] Note   Patient Care Team: Hoyt Koch, MD as PCP - General (Internal Medicine) Mauro Kaufmann, RN as Oncology Nurse Navigator Rockwell Germany, RN as Oncology Nurse Navigator Excell Seltzer, MD as Consulting Physician (General Surgery) Truitt Merle, MD as Consulting Physician (Hematology) Gery Pray, MD as Consulting Physician (Radiation Oncology)  Date of Service:  11/19/2018   CHIEF COMPLAINTS/PURPOSE OF CONSULTATION:  Newly diagnosed Malignant neoplasm of upper-outer quadrant of right breast   Oncology History   Cancer Staging Malignant neoplasm of upper-outer quadrant of right breast in female, estrogen receptor positive ([place]) Staging form: Breast, AJCC 8th Edition - Clinical stage from 11/10/2018: Stage IA (cT1b, cN0, cM0, G2, ER+, PR+, HER2+) - Signed by Truitt Merle, MD on 11/18/2018       Malignant neoplasm of upper-outer quadrant of right breast in female, estrogen receptor positive ([place])   11/07/2018 Mammogram    Diagnostic 11/07/18 IMPRESSION: 1. 1 x 0.7 x 0.6 cm hypoechoic mass with distortion at the 9-9:30 position of the RIGHT breast 6 cm from the nipple with distortion in the UPPER-OUTER RIGHT breast, corresponding to the mammographic abnormality. One RIGHT axillary lymph node with borderline cortical thickness. Tissue sampling of the RIGHT breast mass and borderline RIGHT axillary lymph node recommended. ADDENDUM: Patient returned today for biopsy of a single borderline thickened lymph node in the RIGHT axilla. I could not reproduce a morphologically abnormal lymph node today, possibly resolved in the interval. Today, only normal appearing lymph nodes were identified in the RIGHT axilla. As such, the axillary ultrasound-guided biopsy was canceled.    11/10/2018 Cancer Staging    Staging form: Breast, AJCC 8th Edition - Clinical stage from 11/10/2018: Stage IA  (cT1b, cN0, cM0, G2, ER+, PR+, HER2+) - Signed by Truitt Merle, MD on 11/18/2018    11/11/2018 Initial Biopsy    Diagnosis 11/11/18 Breast, right, needle core biopsy, upper outer - 9:30 o'clock position - INVASIVE DUCTAL CARCINOMA, GRADE II/III. - SEE MICROSCOPIC DESCRIPTION.    11/11/2018 Receptors her2    Results: IMMUNOHISTOCHEMICAL AND MORPHOMETRIC ANALYSIS PERFORMED MANUALLY The tumor cells are POSITIVE for Her2 (3+). Estrogen Receptor: 95%, POSITIVE, STRONG STAINING INTENSITY Progesterone Receptor: 5%, POSITIVE, STRONG STAINING INTENSITY Proliferation Marker Ki67: 20%    11/17/2018 Initial Diagnosis    Malignant neoplasm of upper-outer quadrant of right breast in female, estrogen receptor positive ([place])      HISTORY OF PRESENTING ILLNESS:  Summer [name] 68 y.o. female is a here because of newly diagnosed right breast cancer. The patient presents to the Breast clinic today accompanied by herself.  This mass was found by screening mammogram. She did not feel the lump herself. This is her first abnormal screening. She feels at baseline with no breast changes or pain.  Today the patient notes no new or concerning pain or aches. She notes to being anemic in the past. She denies any current GI bleeding.   Socially she is widowed with 1 son who she lives with. She works at a Journalist, newspaper.  They have a PMHx of trigeminal neuralgia, mostly right sided. She had gamma knife treatment in 03/2018. She feels this did not improve her symptoms. She has h/o shingles of back, kidney stones, diverticulitis with partial colon removal. She had a total hysterectomy in 1998/1999 due to fibroids, with possible BSO.    GYN HISTORY  Menarchal: 15 LMP: 1998/1999 due  to total hysterectomy with possible BSO.  Contraceptive: NA HRT: No G1P1: son when she was 46 yo    REVIEW OF SYSTEMS:    Constitutional: Denies fevers, chills or abnormal night sweats Eyes: Denies  blurriness of vision, double vision or watery eyes Ears, nose, mouth, throat, and face: Denies mucositis or sore throat Respiratory: Denies cough, dyspnea or wheezes Cardiovascular: Denies palpitation, chest discomfort or lower extremity swelling Gastrointestinal:  Denies nausea, heartburn or change in bowel habits Skin: Denies abnormal skin rashes Lymphatics: Denies new lymphadenopathy or easy bruising Neurological:Denies numbness, tingling or new weaknesses (+) trigeminal neuralgia, mostly right sided.   Behavioral/Psych: Mood is stable, no new changes  All other systems were reviewed with the patient and are negative.   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   Diverticulosis    Fibromyalgia    History of kidney stones    History of kidney stones    History of shingles 2012--  no residual pain   Left ureteral calculus    MCI (mild cognitive impairment)    Trigeminal neuralgia RIGHT   Urgency of urination     SURGICAL HISTORY: Past Surgical History:  Procedure Laterality Date   CEREBRAL MICROVASCULAR DECOMPRESSION  07-05-2006   RIGHT TRIGEMINAL NERVE   CYSTOSCOPY/RETROGRADE/URETEROSCOPY/STONE EXTRACTION WITH BASKET  07/04/2012   Procedure: CYSTOSCOPY/RETROGRADE/URETEROSCOPY/STONE EXTRACTION WITH BASKET;  Surgeon: Claybon Jabs, MD;  Location: [facility];  Service: Urology;  Laterality: Left;   EXPLORATORY LAPAROTOMY/ RESECTION MID TO DISTAL SIGMOID AND PROXIMAL RECTUM/ END PROXIMAL SIGMOID COLOSTOMY  07-17-2006   PERFORATED DIVERTICULITIS WITH PERITONITIS   gamma knife  05/17/2016   for trigeminal neuralgia, [facility], Dr Salomon Fick   gamma knife  2019   RESECTION COLOSTOMY/ CLOSURE COLOSTOMY WITH COLOPROCTOSTOMY  02-20-2007   TONSILLECTOMY  AS CHILD   URETEROSCOPY  07/04/2012   Procedure: URETEROSCOPY;   Surgeon: Claybon Jabs, MD;  Location: [facility];  Service: Urology;  Laterality: Left;   VAGINAL HYSTERECTOMY  1998   Partial   WISDOM TOOTH EXTRACTION      SOCIAL HISTORY: Social History   Socioeconomic History   Marital status: Married    Spouse name: Not on file   Number of children: 1   Years of education: College   Highest education level: Not on file  Occupational History    Employer: UNEMPLOYED   Occupation: HUMAN Stage manager: [employer]  Social Needs   Financial resource strain: Not on file   Food insecurity:    Worry: Not on file    Inability: Not on file   Transportation needs:    Medical: Not on file    Non-medical: Not on file  Tobacco Use   Smoking status: Never Smoker   Smokeless tobacco: Never Used  Substance and Sexual Activity   Alcohol use: Yes    Comment: rarely   Drug use: No   Sexual activity: Not on file  Lifestyle   Physical activity:    Days per week: Not on file    Minutes per session: Not on file   Stress: Not on file  Relationships   Social connections:    Talks on phone: Not on file    Gets together: Not on file    Attends religious service: Not on file    Active member  of club or organization: Not on file    Attends meetings of clubs or organizations: Not on file    Relationship status: Not on file   Intimate partner violence:    Fear of current or ex partner: Not on file    Emotionally abused: Not on file    Physically abused: Not on file    Forced sexual activity: Not on file  Other Topics Concern   Not on file  Social History Narrative   Not on file    FAMILY HISTORY: Family History  Problem Relation Age of Onset   Heart disease Maternal Grandmother    Diabetes Mother    Breast cancer Maternal Aunt    Lupus Cousin    Colon cancer Neg Hx    Esophageal cancer Neg Hx    Rectal cancer Neg Hx    Stomach cancer Neg Hx     ALLERGIES:  is allergic to  clindamycin hcl.  MEDICATIONS:  Current Outpatient Medications  Medication Sig Dispense Refill   acetaminophen (TYLENOL) 500 MG tablet Take 1,000 mg by mouth every 6 (six) hours as needed.     DULoxetine (CYMBALTA) 30 MG capsule      gabapentin (NEURONTIN) 600 MG tablet TAKE 2 TABLETS TID. MAX OF SIX TABS PER DAY. 540 tablet 3   HYDROcodone-acetaminophen (NORCO/VICODIN) 5-325 MG tablet Take by mouth.     LamoTRIgine 200 MG TB24 24 hour tablet Take 1 tablet (200 mg total) by mouth at bedtime. 30 tablet 11   meloxicam (MOBIC) 15 MG tablet Take 1 tablet (15 mg total) by mouth daily. 30 tablet 0   Vitamin D, Ergocalciferol, (DRISDOL) 50000 units CAPS capsule Take 1 capsule (50,000 Units total) by mouth every 7 (seven) days. 12 capsule 0   No current facility-administered medications for this visit.     PHYSICAL EXAMINATION: ECOG PERFORMANCE STATUS: 0 - Asymptomatic  Vitals:   11/19/18 1402  BP: (!) 135/50  Pulse: 82  Resp: 18  Temp: 99 F (37.2 C)  SpO2: 100%   Filed Weights   11/19/18 1402  Weight: 188 lb 6.4 oz (85.5 kg)    GENERAL:alert, no distress and comfortable SKIN: skin color, texture, turgor are normal, no rashes or significant lesions EYES: normal, conjunctiva are pink and non-injected, sclera clear OROPHARYNX:no exudate, no erythema and lips, buccal mucosa, and tongue normal  NECK: supple, thyroid normal size, non-tender, without nodularity LYMPH:  no palpable lymphadenopathy in the cervical, axillary or inguinal LUNGS: clear to auscultation and percussion with normal breathing effort HEART: regular rate & rhythm and no murmurs and no lower extremity edema ABDOMEN:abdomen soft, non-tender and normal bowel sounds Musculoskeletal:no cyanosis of digits and no clubbing  PSYCH: alert & oriented x 3 with fluent speech NEURO: no focal motor/sensory deficits BREAST: (+)lumpy breast tissue (+) No palpable mass or adenopathy in either breasts   LABORATORY DATA:  I  have reviewed the data as listed CBC Latest Ref Rng & Units 11/19/2018 10/12/2018 07/01/2018  WBC 4.0 - 10.5 K/uL 6.2 7.9 4.8  Hemoglobin 12.0 - 15.0 g/dL 9.0(L) 10.1(L) 10.5(L)  Hematocrit 36.0 - 46.0 % 31.2(L) 34.8(L) 33.2(L)  Platelets 150 - 400 K/uL 374 379 322.0    CMP Latest Ref Rng & Units 11/19/2018 10/12/2018 07/01/2018  Glucose 70 - 99 mg/dL 83 136(H) 105(H)  BUN 8 - 23 mg/dL 16 11 12   Creatinine 0.44 - 1.00 mg/dL 0.82 0.96 0.79  Sodium 135 - 145 mmol/L 141 136 141  Potassium 3.5 - 5.1  mmol/L 4.1 3.1(L) 3.7  Chloride 98 - 111 mmol/L 106 101 103  CO2 22 - 32 mmol/L 24 22 31   Calcium 8.9 - 10.3 mg/dL 9.0 9.3 9.1  Total Protein 6.5 - 8.1 g/dL 7.2 - 6.7  Total Bilirubin 0.3 - 1.2 mg/dL 0.6 - 0.4  Alkaline Phos 38 - 126 U/L 73 - 68  AST 15 - 41 U/L 17 - 14  ALT 0 - 44 U/L 9 - 8     RADIOGRAPHIC STUDIES: I have personally reviewed the radiological images as listed and agreed with the findings in the report. US Breast Ltd Uni Right Inc Axilla  Addendum Date: 11/14/2018   ADDENDUM REPORT: 11/14/2018 15:48 ADDENDUM: Patient returned today for biopsy of a single borderline thickened lymph node in the RIGHT axilla. I could not reproduce a morphologically abnormal lymph node today, possibly resolved in the interval. Today, only normal appearing lymph nodes were identified in the RIGHT axilla. As such, the axillary ultrasound-guided biopsy was canceled. Recommendation: Treatment plan for patient's known (recently diagnosed) RIGHT breast carcinoma. Electronically Signed   By: Franki Cabot M.D.   On: 11/14/2018 15:48   Result Date: 11/14/2018 CLINICAL DATA:  68 year old female for evaluation of possible RIGHT breast distortion on screening mammogram. EXAM: DIGITAL DIAGNOSTIC RIGHT MAMMOGRAM WITH TOMO ULTRASOUND RIGHT BREAST COMPARISON:  Previous exam(s). ACR Breast Density Category b: There are scattered areas of fibroglandular density. FINDINGS: 2D/3D spot compression views of the RIGHT breast  demonstrate persistent distortion within the OUTER RIGHT breast. Mammographic images were processed with CAD. Targeted ultrasound is performed, showing a 1 x 0.7 x 0.6 cm hypoechoic mass with distortion at the 9-9:30 position of the RIGHT breast 6 cm from the nipple, corresponding to the mammographic distortion. A single RIGHT axillary lymph node with borderline cortical thickness of 3 mm is noted. IMPRESSION: 1. 1 cm hypoechoic mass with distortion in the UPPER-OUTER RIGHT breast, corresponding to the mammographic abnormality. One RIGHT axillary lymph node with borderline cortical thickness. Tissue sampling of the RIGHT breast mass and borderline RIGHT axillary lymph node recommended. RECOMMENDATION: Ultrasound-guided biopsies of RIGHT breast mass and RIGHT axillary lymph node, which has been scheduled for 11/11/2018. I have discussed the findings and recommendations with the patient. Results were also provided in writing at the conclusion of the visit. If applicable, a reminder letter will be sent to the patient regarding the next appointment. BI-RADS CATEGORY  4: Suspicious. Electronically Signed: By: Margarette Canada M.D. On: 11/07/2018 11:25   Mm Diag Breast Tomo Uni Right  Addendum Date: 11/14/2018   ADDENDUM REPORT: 11/14/2018 15:48 ADDENDUM: Patient returned today for biopsy of a single borderline thickened lymph node in the RIGHT axilla. I could not reproduce a morphologically abnormal lymph node today, possibly resolved in the interval. Today, only normal appearing lymph nodes were identified in the RIGHT axilla. As such, the axillary ultrasound-guided biopsy was canceled. Recommendation: Treatment plan for patient's known (recently diagnosed) RIGHT breast carcinoma. Electronically Signed   By: Franki Cabot M.D.   On: 11/14/2018 15:48   Result Date: 11/14/2018 CLINICAL DATA:  68 year old female for evaluation of possible RIGHT breast distortion on screening mammogram. EXAM: DIGITAL DIAGNOSTIC RIGHT  MAMMOGRAM WITH TOMO ULTRASOUND RIGHT BREAST COMPARISON:  Previous exam(s). ACR Breast Density Category b: There are scattered areas of fibroglandular density. FINDINGS: 2D/3D spot compression views of the RIGHT breast demonstrate persistent distortion within the OUTER RIGHT breast. Mammographic images were processed with CAD. Targeted ultrasound is performed, showing a 1 x 0.7  x 0.6 cm hypoechoic mass with distortion at the 9-9:30 position of the RIGHT breast 6 cm from the nipple, corresponding to the mammographic distortion. A single RIGHT axillary lymph node with borderline cortical thickness of 3 mm is noted. IMPRESSION: 1. 1 cm hypoechoic mass with distortion in the UPPER-OUTER RIGHT breast, corresponding to the mammographic abnormality. One RIGHT axillary lymph node with borderline cortical thickness. Tissue sampling of the RIGHT breast mass and borderline RIGHT axillary lymph node recommended. RECOMMENDATION: Ultrasound-guided biopsies of RIGHT breast mass and RIGHT axillary lymph node, which has been scheduled for 11/11/2018. I have discussed the findings and recommendations with the patient. Results were also provided in writing at the conclusion of the visit. If applicable, a reminder letter will be sent to the patient regarding the next appointment. BI-RADS CATEGORY  4: Suspicious. Electronically Signed: By: Margarette Canada M.D. On: 11/07/2018 11:25   Mm 3d Screen Breast Bilateral  Result Date: 10/31/2018 CLINICAL DATA:  Screening. EXAM: DIGITAL SCREENING BILATERAL MAMMOGRAM WITH TOMO AND CAD COMPARISON:  Previous exam(s). ACR Breast Density Category b: There are scattered areas of fibroglandular density. FINDINGS: In the right breast, possible distortion warrants further evaluation. In the left breast, no findings suspicious for malignancy. Images were processed with CAD. IMPRESSION: Further evaluation is suggested for possible distortion in the right breast. RECOMMENDATION: Diagnostic mammogram and  possibly ultrasound of the right breast. (Code:[id number]) The patient will be contacted regarding the findings, and additional imaging will be scheduled. BI-RADS CATEGORY  0: Incomplete. Need additional imaging evaluation and/or prior mammograms for comparison. Electronically Signed   By: Ammie Ferrier M.D.   On: 10/31/2018 13:26   Mm Clip Placement Right  Result Date: 11/11/2018 CLINICAL DATA:  Evaluate ultrasound-guided RIGHT clip placement breast biopsy following. EXAM: DIAGNOSTIC RIGHT MAMMOGRAM POST ULTRASOUND BIOPSY COMPARISON:  Previous exam(s). FINDINGS: Mammographic images were obtained following ultrasound guided biopsy of the 1 cm mass at the 9:30 position of the RIGHT breast. The RIBBON clip is in satisfactory position and corresponds to the mammographic distortion. IMPRESSION: Satisfactory RIBBON clip position following ultrasound-guided RIGHT breast biopsy. The 1 cm mass biopsied corresponds to the mammographic distortion. Final Assessment: Post Procedure Mammograms for Marker Placement Electronically Signed   By: Margarette Canada M.D.   On: 11/11/2018 10:21   [place] Rt Breast Bx W Loc Dev 1st Lesion Img Bx Spec US Guide  Addendum Date: 11/14/2018   ADDENDUM REPORT: 11/14/2018 15:47 ADDENDUM: Patient returned today for biopsy of a single borderline thickened lymph node in the RIGHT axilla. I could not reproduce a morphologically abnormal lymph node today, possibly resolved in the interval. Today, only normal appearing lymph nodes were identified in the RIGHT axilla. As such, the axillary ultrasound-guided biopsy was canceled. Recommendation: Treatment plan for patient's known (recently diagnosed) RIGHT breast carcinoma. Electronically Signed   By: Franki Cabot M.D.   On: 11/14/2018 15:47   Addendum Date: 11/12/2018   ADDENDUM REPORT: 11/12/2018 17:01 ADDENDUM: Pathology revealed INVASIVE DUCTAL CARCINOMA, GRADE II/III of the RIGHT breast, upper outer-9:30 o'clock position. This was found to be  concordant by Dr. Hassan Rowan. Pathology results were discussed with the patient by telephone. The patient reported doing well after the biopsy with tenderness at the site. Post biopsy instructions and care were reviewed and questions were answered. The patient was encouraged to call [facility] for any additional concerns. The patient was [facility] on November 19, 2018. The patient is scheduled for RIGHT axillary node biopsy November 14, 2018. Pathology results reported by Stacie Acres, RN on 11/12/2018. Electronically Signed   By: Margarette Canada M.D.   On: 11/12/2018 17:01   Result Date: 11/14/2018 CLINICAL DATA:  68 year old female for tissue sampling of 1 cm mass/distortion within the UPPER-OUTER RIGHT breast. EXAM: ULTRASOUND GUIDED RIGHT BREAST CORE NEEDLE BIOPSY COMPARISON:  Previous exam(s). FINDINGS: I met with the patient and we discussed the procedure of ultrasound-guided biopsy, including benefits and alternatives. We discussed the high likelihood of a successful procedure. We discussed the risks of the procedure, including infection, bleeding, tissue injury, clip migration, and inadequate sampling. Informed written consent was given. The usual time-out protocol was performed immediately prior to the procedure. Lesion quadrant: UPPER-OUTER RIGHT breast Using sterile technique and 1% Lidocaine as local anesthetic, under direct ultrasound visualization, a 12 gauge spring-loaded device was used to perform biopsy of the 1 cm irregular hypoechoic mass at the 9:30 position of the RIGHT breast using a LATERAL approach. At the conclusion of the procedure a RIBBON tissue marker clip was deployed into the biopsy cavity. Follow up 2 view mammogram was performed and dictated separately. The follow-up mammogram demonstrated that this biopsy 1 cm mass biopsied corresponds to the distortion identified mammographically.  IMPRESSION: Ultrasound guided biopsy of 1 cm UPPER-OUTER RIGHT breast mass. No apparent complications. Electronically Signed: By: Margarette Canada M.D. On: 11/11/2018 10:23    ASSESSMENT & PLAN:  Summer [name] is a 68 y.o. African-American female with a history of Arthritis, diverticulosis s/p partial colectomy, H/o kidney stones and trigeminal neuralgia, mostly right sided.     1. Malignant neoplasm of upper-outer quadrant of right breast, Stage IA, cT1b,N0,M0), ER/PR+, HER2+, Grade II/III -We discussed her image findings and the biopsy results in great details.She has invasive ductal carcinoma of right breast.  -Giving the small size tumor, she is a candidate for lumpectomy with sentinel lymph node biopsy. She is agreeable with that. She was seen by Dr. Excell Seltzer today and likely will proceed with surgery soon.  -The risk of recurrence depends on the stage and biology of the tumor. She has early stage disease, intermediate to high grade, with ER/PR positive and HER2 positive markers. I discussed HER2+ disease is more aggressive and fast growing which increases her risk of recurrence.  -To lower her risk of distant recurrence post surgery, I recommend adjuvant chemo with weekly Taxol and Herceptin for 12 weeks followed by maintenance Herceptin to complete 1 year treatment.  -If tumor is larger or with positive node, I will recommend more intensive chemo such as TCHP. If less than 81m, I may not recommend chemotherapy.   --Chemotherapy consent: Side effects including but does not limited to, fatigue, nausea, vomiting, diarrhea, hair loss, neuropathy, fluid retention, renal and kidney dysfunction, neutropenic fever, needed for blood transfusion, bleeding, cardiomyopathy and heart failure, were discussed with patient in great detail. She is interested.  -She was also seen by radiation oncologist Dr. KSondra Cometoday.  Adjuvant radiation was offered after lumpectomy. -Giving the strong ER and PR  expression in her postmenopausal status, I recommend adjuvant endocrine therapy with aromatase inhibitor for a total of 5-10 years to reduce the risk of cancer recurrence. Potential benefits and side effects were discussed with patient and she is interested. -We also discussed the breast cancer surveillance after her surgery. She will continue annual screening mammogram, self exam, and a routine office visit with lab and exam with u[place] -Given long  term IV treatment I suggest she get a PAC placed during breast surgery. She is agreeable -Will get baseline ECHO before treatment and every 3-4 months will be repeated to monitor closely while on Herceptin.  -Labs reviewed today, CBC and CMP WNL except anemia, potassium at 3.1, BG at 136.  -I answered all her questions to her satisfaction and understanding.  -F/u after surgery   2. Anemia, normocytic  -Asymptomatic, CBC today showed Hg 9.0, normal MCV, absolute reticulocyte count 52 which is low.  -Pt notes she has been anemic in the past one year -Last colonoscopy was in 04/2012 which was unremarkable except diverticulosis.  -She had partial colectomy in the past due to severe diverticulosis. I discussed diverticulosis can cause GI bleeding. She denies blood in stool currently. This is possible iron deficiency from diverticulosis.  -I recommend further lab work today, including ferritin, iron study, B12, folate acid, and SPEP with light chain levels   3. trigeminal neuralgia -on meds, stable   PLAN:  -Lab today for anemia work up, I will call her with results  -Proceed with surgery and port placement.   -F/u 2 weeks after surgery to finalize her adjuvant chemo, will do echo after surgery    Orders Placed This Encounter  Procedures   Retic Panel    Standing Status:   Future    Number of Occurrences:   1    Standing Expiration Date:   11/19/2019   Ferritin    Standing Status:   Future    Number of Occurrences:   1    Standing Expiration Date:    11/19/2019   Iron and TIBC    Standing Status:   Future    Number of Occurrences:   1    Standing Expiration Date:   11/19/2019   Folate RBC    Standing Status:   Future    Number of Occurrences:   1    Standing Expiration Date:   11/19/2019   Vitamin B12    Standing Status:   Future    Number of Occurrences:   1    Standing Expiration Date:   11/19/2019   SPEP with reflex to IFE    Standing Status:   Future    Number of Occurrences:   1    Standing Expiration Date:   11/19/2019   Kappa/lambda light chains    Standing Status:   Future    Number of Occurrences:   1    Standing Expiration Date:   11/19/2019    All questions were answered. The patient knows to call the clinic with any problems, questions or concerns. I spent 55 minutes counseling the patient face to face. The total time spent in the appointment was 60 minutes and more than 50% was on counseling.     Truitt Merle, MD 11/19/2018 8:10 PM  I, Joslyn Devon, am acting as scribe for Truitt Merle, MD.   I have reviewed the above documentation for accuracy and completeness, and I agree with the above.

## 2018-11-19 NOTE — Patient Instructions (Signed)

## 2018-11-20 ENCOUNTER — Other Ambulatory Visit: Payer: Self-pay | Admitting: General Surgery

## 2018-11-20 DIAGNOSIS — Z170 Estrogen receptor positive status [ER+]: Principal | ICD-10-CM

## 2018-11-20 DIAGNOSIS — C50911 Malignant neoplasm of unspecified site of right female breast: Secondary | ICD-10-CM

## 2018-11-20 LAB — PROTEIN ELECTROPHORESIS, SERUM, WITH REFLEX
A/G Ratio: 1.3 (ref 0.7–1.7)
Albumin ELP: 3.8 g/dL (ref 2.9–4.4)
Alpha-1-Globulin: 0.2 g/dL (ref 0.0–0.4)
Alpha-2-Globulin: 0.6 g/dL (ref 0.4–1.0)
Beta Globulin: 1.3 g/dL (ref 0.7–1.3)
Gamma Globulin: 0.9 g/dL (ref 0.4–1.8)
Globulin, Total: 3 g/dL (ref 2.2–3.9)
Total Protein ELP: 6.8 g/dL (ref 6.0–8.5)

## 2018-11-20 LAB — IRON AND TIBC
Iron: 17 ug/dL — ABNORMAL LOW (ref 41–142)
Saturation Ratios: 4 % — ABNORMAL LOW (ref 21–57)
TIBC: 449 ug/dL — ABNORMAL HIGH (ref 236–444)
UIBC: 432 ug/dL — ABNORMAL HIGH (ref 120–384)

## 2018-11-20 LAB — KAPPA/LAMBDA LIGHT CHAINS
Kappa free light chain: 12.4 mg/L (ref 3.3–19.4)
Kappa, lambda light chain ratio: 1.55 (ref 0.26–1.65)
Lambda free light chains: 8 mg/L (ref 5.7–26.3)

## 2018-11-20 LAB — FOLATE RBC
Folate, Hemolysate: 377 ng/mL
Folate, RBC: 1240 ng/mL (ref >498)
Hematocrit: 30.4 % — ABNORMAL LOW (ref 34.0–46.6)

## 2018-11-20 LAB — FERRITIN: Ferritin: 4 ng/mL — ABNORMAL LOW (ref 11–307)

## 2018-11-20 LAB — VITAMIN B12: Vitamin B-12: 329 pg/mL (ref 180–914)

## 2018-11-21 ENCOUNTER — Other Ambulatory Visit: Payer: Self-pay | Admitting: Hematology

## 2018-11-21 DIAGNOSIS — D509 Iron deficiency anemia, unspecified: Secondary | ICD-10-CM | POA: Insufficient documentation

## 2018-11-21 HISTORY — DX: Iron deficiency anemia, unspecified: D50.9

## 2018-11-24 ENCOUNTER — Telehealth: Payer: Self-pay

## 2018-11-24 ENCOUNTER — Telehealth: Payer: Self-pay | Admitting: *Deleted

## 2018-11-24 NOTE — Pre-Procedure Instructions (Signed)
Christabelle Stefanie Hodgens  11/24/2018      [id number] [address], [place] [address] Phone: [phone] Fax: [phone]    Your procedure is scheduled on March 20th, 2020.  Report to [facility] Admitting at 09:30 A.M.  Call this number if you have problems the morning of surgery:  [phone]   Remember:   Take these medicines the morning of surgery with A SIP OF WATER: Cymbalta, Gabapentin,  Lamotrigine, Meloxicam, Vicodin and Tylenol if needed.    Do not wear jewelry, make-up or nail polish.  Do not wear lotions, powders, or perfumes, or deodorant.  Do not shave 48 hours prior to surgery.  Men may shave face and neck.  Do not bring valuables to the hospital.  [facility] is not responsible for any belongings or valuables.  Contacts, dentures or bridgework may not be worn into surgery.  Leave your suitcase in the car.  After surgery it may be brought to your room.  For patients admitted to the hospital, discharge time will be determined by your treatment team.  Patients discharged the day of surgery will not be allowed to drive home.    [facility] Preparing For Surgery  Before surgery, you can play an important role. Because skin is not sterile, your skin needs to be as free of germs as possible. You can reduce the number of germs on your skin by washing with CHG (chlorahexidine gluconate) Soap before surgery.  CHG is an antiseptic cleaner which kills germs and bonds with the skin to continue killing germs even after washing.    Oral Hygiene is also important to reduce your risk of infection.  Remember - BRUSH YOUR TEETH THE MORNING OF SURGERY WITH YOUR REGULAR TOOTHPASTE  Please do not use if you have an allergy to CHG or antibacterial soaps. If your skin becomes reddened/irritated stop using the CHG.  Do not shave (including legs and underarms) for at least 48 hours prior to first CHG shower.  It is OK to shave your face.  Please follow these instructions carefully.   1. Shower the NIGHT BEFORE SURGERY and the MORNING OF SURGERY with CHG.   2. If you chose to wash your hair, wash your hair first as usual with your normal shampoo.  3. After you shampoo, rinse your hair and body thoroughly to remove the shampoo.  4. Use CHG as you would any other liquid soap. You can apply CHG directly to the skin and wash gently with a scrungie or a clean washcloth.   5. Apply the CHG Soap to your body ONLY FROM THE NECK DOWN.  Do not use on open wounds or open sores. Avoid contact with your eyes, ears, mouth and genitals (private parts). Wash Face and genitals (private parts)  with your normal soap.  6. Wash thoroughly, paying special attention to the area where your surgery will be performed.  7. Thoroughly rinse your body with warm water from the neck down.  8. DO NOT shower/wash with your normal soap after using and rinsing off the CHG Soap.  9. Pat yourself dry with a CLEAN TOWEL.  10. Wear CLEAN PAJAMAS to bed the night before surgery, wear comfortable clothes the morning of surgery  11. Place CLEAN SHEETS on your bed the night of your first shower and DO NOT SLEEP WITH PETS.    Day of Surgery:  Do not apply any deodorants/lotions.  Please  wear clean clothes to the hospital/surgery center.   Remember to brush your teeth WITH YOUR REGULAR TOOTHPASTE.

## 2018-11-24 NOTE — Telephone Encounter (Signed)
"  Summer [name] [phone]).  I keep trying to return call but unable to reach anyone with scheduling receive voicemail, put on hold or no answer.  Someone left a message I am to receive I.V.R. before next week's surgery.  I don't know what I.V.R. is.  Surgery is this week, not next.  Presurgical testing tomorrow at 1:00 pm, seed on 11-27-2018 and surgery 11-28-2018.  Have to put in advance request for time; for example request today if I need to come tomorrow.  I'd like to receive iron before Friday.  Let staff know it's okay to leave a message and I'll return the call."

## 2018-11-24 NOTE — Telephone Encounter (Signed)
Left voice message for patient, per Dr. Burr Medico lab results are consistent with iron deficiency anemia.  Since her surgery is a week away Dr. Burr Medico recommends a IV iron infusion.  Someone from scheduling should be calling with that appointment time for this week.

## 2018-11-24 NOTE — Telephone Encounter (Signed)
-----   Message from Truitt Merle, MD sent at 11/21/2018 10:42 PM EDT ----- Please let pt know her lab result is consistent iron deficient anemia. Since her surgery is one week away, I recommend IV iron, I will set up for her. Thanks   Truitt Merle  11/21/2018

## 2018-11-25 ENCOUNTER — Telehealth: Payer: Self-pay | Admitting: *Deleted

## 2018-11-25 ENCOUNTER — Other Ambulatory Visit: Payer: Self-pay

## 2018-11-25 ENCOUNTER — Encounter (HOSPITAL_COMMUNITY): Payer: Self-pay

## 2018-11-25 ENCOUNTER — Encounter (HOSPITAL_COMMUNITY)
Admission: RE | Admit: 2018-11-25 | Discharge: 2018-11-25 | Disposition: A | Discharge status: Home / Self Care | Payer: Federal, State, Local not specified - PPO | Source: Ambulatory Visit | Attending: General Surgery | Admitting: General Surgery

## 2018-11-25 DIAGNOSIS — Z01812 Encounter for preprocedural laboratory examination: Secondary | ICD-10-CM | POA: Diagnosis present

## 2018-11-25 DIAGNOSIS — Z170 Estrogen receptor positive status [ER+]: Secondary | ICD-10-CM

## 2018-11-25 DIAGNOSIS — C50411 Malignant neoplasm of upper-outer quadrant of right female breast: Secondary | ICD-10-CM

## 2018-11-25 NOTE — Progress Notes (Signed)
Called for follow-up after [place] on 11/19/2018. Received voicemail and left message with information regarding [facility] and [facility] resources. Invited the pt to reach out if any needs arise, providing contact information.   Doris Cheadle, Counseling Intern [phone]

## 2018-11-25 NOTE — Progress Notes (Signed)
PCP - Pricilla Holm Cardiologist - denies  Chest x-ray - N/A EKG - N/A  DM - denies SA - denies  Anesthesia review: yes, abnormal labs (taken 11/19/18 @ [place]) Hgb - 9.0  Patient denies shortness of breath, fever, cough and chest pain at PAT appointment   Patient verbalized understanding of instructions that were given to them at the PAT appointment. Patient was also instructed that they will need to review over the PAT instructions again at home before surgery.

## 2018-11-25 NOTE — Telephone Encounter (Signed)
Spoke to pt concerning [place] from 3.11.20. Pt denies questions or concerns regarding dx or treatment care plan.Encourage pt to call with needs. Received verbal understanding.

## 2018-11-26 ENCOUNTER — Inpatient Hospital Stay: Payer: Federal, State, Local not specified - PPO

## 2018-11-26 ENCOUNTER — Other Ambulatory Visit: Payer: Self-pay

## 2018-11-26 VITALS — BP 128/53 | HR 80 | Temp 98.5°F | Resp 17

## 2018-11-26 DIAGNOSIS — D509 Iron deficiency anemia, unspecified: Secondary | ICD-10-CM

## 2018-11-26 DIAGNOSIS — C50411 Malignant neoplasm of upper-outer quadrant of right female breast: Secondary | ICD-10-CM | POA: Diagnosis not present

## 2018-11-26 MED ORDER — SODIUM CHLORIDE 0.9 % IV SOLN
510.0000 mg | Freq: Once | INTRAVENOUS | Status: AC
  Administered 2018-11-26: 510 mg via INTRAVENOUS
  Filled 2018-11-26: qty 17

## 2018-11-26 MED ORDER — SODIUM CHLORIDE 0.9 % IV SOLN
Freq: Once | INTRAVENOUS | Status: AC
  Administered 2018-11-26: 15:00:00 via INTRAVENOUS
  Filled 2018-11-26: qty 250

## 2018-11-26 NOTE — Anesthesia Preprocedure Evaluation (Addendum)
Anesthesia Evaluation  Patient identified by MRN, date of birth, ID band Patient awake    Reviewed: Allergy & Precautions, NPO status , Patient's Chart, lab work & pertinent test results  Airway Mallampati: III  TM Distance: >3 FB Neck ROM: Full    Dental no notable dental hx. (+) Teeth Intact   Pulmonary neg pulmonary ROS,    Pulmonary exam normal breath sounds clear to auscultation       Cardiovascular negative cardio ROS Normal cardiovascular exam Rhythm:Regular Rate:Normal     Neuro/Psych negative neurological ROS  negative psych ROS   GI/Hepatic negative GI ROS, Neg liver ROS,   Endo/Other  negative endocrine ROS  Renal/GU negative Renal ROS  negative genitourinary   Musculoskeletal  (+) Arthritis , Fibromyalgia -  Abdominal   Peds  Hematology  (+) Blood dyscrasia, anemia ,   Anesthesia Other Findings Right breast cancer  Reproductive/Obstetrics negative OB ROS                             Anesthesia Physical Anesthesia Plan  ASA: II  Anesthesia Plan: General and Regional   Post-op Pain Management:  Regional for Post-op pain   Induction: Intravenous  PONV Risk Score and Plan: 3  Airway Management Planned: Oral ETT  Additional Equipment:   Intra-op Plan:   Post-operative Plan: Extubation in OR  Informed Consent: I have reviewed the patients History and Physical, chart, labs and discussed the procedure including the risks, benefits and alternatives for the proposed anesthesia with the patient or authorized representative who has indicated his/her understanding and acceptance.     Dental advisory given  Plan Discussed with: CRNA  Anesthesia Plan Comments: (IDA noted on labs from 11/19/18, Hgb 9.0. Oncologist Dr. Burr Medico commented on results stating she would arrange IV iron prior to surgery. Staff message sent to Dr. Excell Seltzer to make sure he is aware. )       Anesthesia Quick Evaluation

## 2018-11-26 NOTE — Patient Instructions (Signed)

## 2018-11-27 ENCOUNTER — Ambulatory Visit
Admission: RE | Admit: 2018-11-27 | Discharge: 2018-11-27 | Disposition: A | Discharge status: Home / Self Care | Payer: Federal, State, Local not specified - PPO | Source: Ambulatory Visit | Attending: General Surgery | Admitting: General Surgery

## 2018-11-27 DIAGNOSIS — C50911 Malignant neoplasm of unspecified site of right female breast: Secondary | ICD-10-CM

## 2018-11-27 DIAGNOSIS — Z170 Estrogen receptor positive status [ER+]: Principal | ICD-10-CM

## 2018-11-27 NOTE — H&P (Signed)
History of Present Illness Marland Kitchen T. Andalyn Heckstall MD; 11/19/2018 4:30 PM) The patient is a 68 year old female who presents with breast cancer. She is a postmenopausal female referred by Dr. Melanee Spry for evaluation of recently diagnosed carcinoma of the right breast. She recently presented for a screening mamogram revealing possible distortion in the right breast. Subsequent imaging included diagnostic mamogram showing persistent distortion in the outer right breast and ultrasound showing a 1 cm hypoechoic mass associated with the distortion at the 9 to 9:30 position of the right breast 6 cm from the nipple. Initial axillary ultrasound showed a order line enlarged lymph node which was no longer present when she returned for biopsy. An ultrasound guided breast biopsy was performed on 11/11/2018 with pathology revealing invasive ductal carcinoma of the breast. She is seen now in breast multidisciplinary clinic for initial treatment planning. She has experienced no breast symptoms, specifically lump or pain or nipple discharge. She does not have a personal history of any previous breast problems.  Findings at that time were the following: Tumor size: 1 cm Tumor grade: 2-3, Ki-67 20% Estrogen Receptor: 95% positive Progesterone Receptor: 5% positive Her-2 neu: positive Lymph node status: negative    Problem List/Past Medical Marland Kitchen T. 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 Daveena Elmore, MD; 11/19/2018 4:32 PM) Colectomy   No current facility-administered medications for this encounter.    Current Outpatient Medications  Medication Sig Dispense Refill  . acetaminophen (TYLENOL) 500 MG tablet Take 1,000 mg by mouth every 6 (six) hours as needed (pain).     Marland Kitchen gabapentin (NEURONTIN) 600 MG tablet TAKE 2 TABLETS TID. MAX OF SIX TABS PER DAY. (Patient taking differently: Take  1,200 mg by mouth 3 (three) times daily. TAKE 2 TABLETS TID. MAX OF SIX TABS PER DAY.) 540 tablet 3  . LamoTRIgine 200 MG TB24 24 hour tablet Take 1 tablet (200 mg total) by mouth at bedtime. 30 tablet 11  . meloxicam (MOBIC) 15 MG tablet Take 1 tablet (15 mg total) by mouth daily. 30 tablet 0   Allergies  Allergen Reactions  . Clindamycin Hcl Itching      Physical Exam Marland Kitchen T. Jennifermarie Franzen MD; 11/19/2018 4:33 PM) The physical exam findings are as follows: Note:General: Alert, mildly obese African-American female, in no distress Skin: Warm and dry without rash or infection. HEENT: No palpable masses or thyromegaly. Sclera nonicteric. Lymph nodes: No cervical, supraclavicular, nodes palpable. Breasts: D cup, pendulous breasts bilaterally. No palpable masses. No skin change or nipple discharge or crusting. Lungs: Breath sounds clear and equal. No wheezing or increased work of breathing. Cardiovascular: Regular rate and rhythm without murmer. No JVD or edema. Peripheral pulses intact. No carotid bruits. Abdomen: Nondistended. Soft and nontender. No masses palpable. No organomegaly. No palpable hernias. healed low midline incision. Extremities: No edema or joint swelling or deformity. No chronic venous stasis changes. Neurologic: Alert and fully oriented. Gait normal. No focal weakness. Psychiatric: Normal mood and affect. Thought content appropriate with normal judgement and insight    Assessment & Plan Marland Kitchen T. Kaley Jutras MD; 11/19/2018 4:39 PM) STAGE I BREAST CANCER, PR-, RIGHT (C50.911) Impression: 68 year old female with a new diagnosis of cancer of the right breast, upper outer quadrant. Clinical stage 1B, ER 95% positive, PR 5% positive, HER-2 positive. I discussed with the patient today initial surgical treatment options. We discussed options of breast conservation with  lumpectomy or total mastectomy and sentinal lymph node biopsy/dissection. Options for reconstruction were  discussed. After discussion she has elected to proceed with breast conservation with lumpectomy and axillary sentinel lymph node biopsy. She will require chemotherapy and Herceptin and Port-A-Cath placement was recommended and discussed in detail.We discussed the indications and nature of the procedure, and expected recovery, in detail. Surgical risks including anesthetic complications, cardiorespiratory complications, bleeding, infection, wound healing complications, blood clots, lymphedema, local and distant recurrence and possible need for further surgery based on the final pathology was discussed and understood. specific Port-A-Cath complications such as vascular injury, catheter displacement or occlusion, infection and DVT were discussed. Chemotherapy, hormonal therapy and radiation therapy have been discussed. she has been provided with literature regarding the treatment of breast cancer. All questions were answered. she understands and agrees to proceed and we will go ahead with scheduling. Current Plans Radioactive seed localized right breast lumpectomy and axillary sentinel lymph node biopsy and placement of Port-A-Cath

## 2018-11-28 ENCOUNTER — Ambulatory Visit (HOSPITAL_COMMUNITY): Payer: Federal, State, Local not specified - PPO

## 2018-11-28 ENCOUNTER — Ambulatory Visit (HOSPITAL_COMMUNITY)
Admission: RE | Admit: 2018-11-28 | Discharge: 2018-11-28 | Disposition: A | Discharge status: Home / Self Care | Payer: Federal, State, Local not specified - PPO | Attending: General Surgery | Admitting: General Surgery

## 2018-11-28 ENCOUNTER — Ambulatory Visit (HOSPITAL_COMMUNITY)
Admission: RE | Admit: 2018-11-28 | Discharge: 2018-11-28 | Disposition: A | Discharge status: Home / Self Care | Payer: Federal, State, Local not specified - PPO | Source: Ambulatory Visit | Attending: General Surgery | Admitting: General Surgery

## 2018-11-28 ENCOUNTER — Other Ambulatory Visit: Payer: Self-pay

## 2018-11-28 ENCOUNTER — Encounter (HOSPITAL_COMMUNITY): Payer: Self-pay

## 2018-11-28 ENCOUNTER — Ambulatory Visit
Admission: RE | Admit: 2018-11-28 | Discharge: 2018-11-28 | Disposition: A | Discharge status: Home / Self Care | Payer: Federal, State, Local not specified - PPO | Source: Ambulatory Visit | Attending: General Surgery | Admitting: General Surgery

## 2018-11-28 ENCOUNTER — Ambulatory Visit (HOSPITAL_COMMUNITY): Payer: Federal, State, Local not specified - PPO | Admitting: Anesthesiology

## 2018-11-28 ENCOUNTER — Encounter (HOSPITAL_COMMUNITY): Payer: Self-pay | Admitting: Oncology

## 2018-11-28 ENCOUNTER — Encounter (HOSPITAL_COMMUNITY)
Admission: RE | Disposition: A | Discharge status: Home / Self Care | Payer: Self-pay | Source: Home / Self Care | Attending: General Surgery

## 2018-11-28 ENCOUNTER — Emergency Department (HOSPITAL_COMMUNITY)
Admission: EM | Admit: 2018-11-28 | Discharge: 2018-11-29 | Disposition: A | Discharge status: Home / Self Care | Payer: Federal, State, Local not specified - PPO | Source: Home / Self Care | Attending: Emergency Medicine | Admitting: Emergency Medicine

## 2018-11-28 ENCOUNTER — Ambulatory Visit (HOSPITAL_COMMUNITY): Payer: Federal, State, Local not specified - PPO | Admitting: Physician Assistant

## 2018-11-28 DIAGNOSIS — C50411 Malignant neoplasm of upper-outer quadrant of right female breast: Secondary | ICD-10-CM | POA: Insufficient documentation

## 2018-11-28 DIAGNOSIS — Z95828 Presence of other vascular implants and grafts: Secondary | ICD-10-CM

## 2018-11-28 DIAGNOSIS — M797 Fibromyalgia: Secondary | ICD-10-CM | POA: Insufficient documentation

## 2018-11-28 DIAGNOSIS — M1990 Unspecified osteoarthritis, unspecified site: Secondary | ICD-10-CM | POA: Insufficient documentation

## 2018-11-28 DIAGNOSIS — Z170 Estrogen receptor positive status [ER+]: Secondary | ICD-10-CM | POA: Insufficient documentation

## 2018-11-28 DIAGNOSIS — C50911 Malignant neoplasm of unspecified site of right female breast: Secondary | ICD-10-CM

## 2018-11-28 DIAGNOSIS — Z791 Long term (current) use of non-steroidal anti-inflammatories (NSAID): Secondary | ICD-10-CM | POA: Diagnosis not present

## 2018-11-28 DIAGNOSIS — T8131XA Disruption of external operation (surgical) wound, not elsewhere classified, initial encounter: Secondary | ICD-10-CM | POA: Insufficient documentation

## 2018-11-28 DIAGNOSIS — Z452 Encounter for adjustment and management of vascular access device: Secondary | ICD-10-CM | POA: Insufficient documentation

## 2018-11-28 DIAGNOSIS — Y658 Other specified misadventures during surgical and medical care: Secondary | ICD-10-CM

## 2018-11-28 DIAGNOSIS — Z79899 Other long term (current) drug therapy: Secondary | ICD-10-CM

## 2018-11-28 DIAGNOSIS — Z419 Encounter for procedure for purposes other than remedying health state, unspecified: Secondary | ICD-10-CM

## 2018-11-28 HISTORY — PX: PORTACATH PLACEMENT: SHX2246

## 2018-11-28 HISTORY — PX: BREAST LUMPECTOMY WITH RADIOACTIVE SEED AND SENTINEL LYMPH NODE BIOPSY: SHX6550

## 2018-11-28 HISTORY — PX: BREAST LUMPECTOMY: SHX2

## 2018-11-28 SURGERY — BREAST LUMPECTOMY WITH RADIOACTIVE SEED AND SENTINEL LYMPH NODE BIOPSY
Anesthesia: Regional | Site: Chest | Laterality: Right

## 2018-11-28 MED ORDER — CHLORHEXIDINE GLUCONATE CLOTH 2 % EX PADS: 6.0000 | MEDICATED_PAD | Freq: Once | CUTANEOUS | Status: DC

## 2018-11-28 MED ORDER — BUPIVACAINE HCL (PF) 0.5 % IJ SOLN
INTRAMUSCULAR | Status: DC | PRN
  Administered 2018-11-28: 30 mL via PERINEURAL

## 2018-11-28 MED ORDER — LIDOCAINE 2% (20 MG/ML) 5 ML SYRINGE
INTRAMUSCULAR | Status: DC | PRN
  Administered 2018-11-28: 80 mg via INTRAVENOUS

## 2018-11-28 MED ORDER — DEXAMETHASONE SODIUM PHOSPHATE 10 MG/ML IJ SOLN
INTRAMUSCULAR | Status: AC
  Filled 2018-11-28: qty 1

## 2018-11-28 MED ORDER — SODIUM CHLORIDE (PF) 0.9 % IJ SOLN
INTRAVENOUS | Status: DC | PRN
  Administered 2018-11-28: 14:00:00

## 2018-11-28 MED ORDER — ONDANSETRON HCL 4 MG/2ML IJ SOLN
INTRAMUSCULAR | Status: DC | PRN
  Administered 2018-11-28: 4 mg via INTRAVENOUS

## 2018-11-28 MED ORDER — SODIUM CHLORIDE 0.9 % IV SOLN
INTRAVENOUS | Status: AC
  Filled 2018-11-28: qty 1.2

## 2018-11-28 MED ORDER — PROPOFOL 10 MG/ML IV BOLUS
INTRAVENOUS | Status: DC | PRN
  Administered 2018-11-28: 170 mg via INTRAVENOUS

## 2018-11-28 MED ORDER — LACTATED RINGERS IV SOLN
INTRAVENOUS | Status: DC
  Administered 2018-11-28: 10:00:00 via INTRAVENOUS

## 2018-11-28 MED ORDER — SODIUM CHLORIDE 0.9 % IV SOLN
INTRAVENOUS | Status: DC | PRN
  Administered 2018-11-28: 500 mL

## 2018-11-28 MED ORDER — BUPIVACAINE-EPINEPHRINE 0.25% -1:200000 IJ SOLN
INTRAMUSCULAR | Status: DC | PRN
  Administered 2018-11-28: 30 mL

## 2018-11-28 MED ORDER — BUPIVACAINE-EPINEPHRINE (PF) 0.25% -1:200000 IJ SOLN
INTRAMUSCULAR | Status: AC
  Filled 2018-11-28: qty 30

## 2018-11-28 MED ORDER — LIDOCAINE 2% (20 MG/ML) 5 ML SYRINGE
INTRAMUSCULAR | Status: AC
  Filled 2018-11-28: qty 5

## 2018-11-28 MED ORDER — ROCURONIUM BROMIDE 10 MG/ML (PF) SYRINGE
PREFILLED_SYRINGE | INTRAVENOUS | Status: DC | PRN
  Administered 2018-11-28: 50 mg via INTRAVENOUS

## 2018-11-28 MED ORDER — MIDAZOLAM HCL 2 MG/2ML IJ SOLN
2.0000 mg | Freq: Once | INTRAMUSCULAR | Status: AC
  Administered 2018-11-28: 2 mg via INTRAVENOUS

## 2018-11-28 MED ORDER — MIDAZOLAM HCL 2 MG/2ML IJ SOLN
INTRAMUSCULAR | Status: AC
  Administered 2018-11-28: 2 mg via INTRAVENOUS
  Filled 2018-11-28: qty 2

## 2018-11-28 MED ORDER — GABAPENTIN 300 MG PO CAPS
ORAL_CAPSULE | ORAL | Status: AC
  Filled 2018-11-28: qty 1

## 2018-11-28 MED ORDER — SODIUM CHLORIDE 0.9 % IV SOLN
INTRAVENOUS | Status: DC | PRN
  Administered 2018-11-28: 60 ug/min via INTRAVENOUS

## 2018-11-28 MED ORDER — CEFAZOLIN SODIUM-DEXTROSE 2-4 GM/100ML-% IV SOLN
2.0000 g | INTRAVENOUS | Status: AC
  Administered 2018-11-28: 2 g via INTRAVENOUS

## 2018-11-28 MED ORDER — DEXAMETHASONE SODIUM PHOSPHATE 10 MG/ML IJ SOLN
INTRAMUSCULAR | Status: DC | PRN
  Administered 2018-11-28: 8 mg via INTRAVENOUS

## 2018-11-28 MED ORDER — TECHNETIUM TC 99M SULFUR COLLOID FILTERED
1.0000 | Freq: Once | INTRAVENOUS | Status: AC | PRN
  Administered 2018-11-28: 1 via INTRADERMAL

## 2018-11-28 MED ORDER — GABAPENTIN 300 MG PO CAPS
300.0000 mg | ORAL_CAPSULE | ORAL | Status: AC
  Administered 2018-11-28: 600 mg via ORAL

## 2018-11-28 MED ORDER — HEPARIN SOD (PORK) LOCK FLUSH 100 UNIT/ML IV SOLN
INTRAVENOUS | Status: AC
  Filled 2018-11-28: qty 5

## 2018-11-28 MED ORDER — OXYCODONE HCL 5 MG PO TABS
5.0000 mg | ORAL_TABLET | Freq: Four times a day (QID) | ORAL | Status: DC | PRN
  Administered 2018-11-28: 5 mg via ORAL

## 2018-11-28 MED ORDER — 0.9 % SODIUM CHLORIDE (POUR BTL) OPTIME
TOPICAL | Status: DC | PRN
  Administered 2018-11-28: 1000 mL

## 2018-11-28 MED ORDER — CELECOXIB 200 MG PO CAPS
ORAL_CAPSULE | ORAL | Status: AC
  Administered 2018-11-28: 200 mg via ORAL
  Filled 2018-11-28: qty 1

## 2018-11-28 MED ORDER — ESMOLOL HCL 100 MG/10ML IV SOLN
INTRAVENOUS | Status: DC | PRN
  Administered 2018-11-28: 50 mg via INTRAVENOUS

## 2018-11-28 MED ORDER — FENTANYL CITRATE (PF) 100 MCG/2ML IJ SOLN
INTRAMUSCULAR | Status: DC | PRN
  Administered 2018-11-28: 100 ug via INTRAVENOUS

## 2018-11-28 MED ORDER — CEFAZOLIN SODIUM-DEXTROSE 2-4 GM/100ML-% IV SOLN
INTRAVENOUS | Status: AC
  Filled 2018-11-28: qty 100

## 2018-11-28 MED ORDER — SUGAMMADEX SODIUM 200 MG/2ML IV SOLN
INTRAVENOUS | Status: DC | PRN
  Administered 2018-11-28: 180 mg via INTRAVENOUS

## 2018-11-28 MED ORDER — FENTANYL CITRATE (PF) 250 MCG/5ML IJ SOLN
INTRAMUSCULAR | Status: AC
  Filled 2018-11-28: qty 5

## 2018-11-28 MED ORDER — ACETAMINOPHEN 500 MG PO TABS
1000.0000 mg | ORAL_TABLET | ORAL | Status: AC
  Administered 2018-11-28: 1000 mg via ORAL

## 2018-11-28 MED ORDER — GABAPENTIN 300 MG PO CAPS
ORAL_CAPSULE | ORAL | Status: AC
  Administered 2018-11-28: 600 mg via ORAL
  Filled 2018-11-28: qty 1

## 2018-11-28 MED ORDER — ROCURONIUM BROMIDE 50 MG/5ML IV SOSY
PREFILLED_SYRINGE | INTRAVENOUS | Status: AC
  Filled 2018-11-28: qty 5

## 2018-11-28 MED ORDER — HEPARIN SOD (PORK) LOCK FLUSH 100 UNIT/ML IV SOLN
INTRAVENOUS | Status: DC | PRN
  Administered 2018-11-28: 500 [IU] via INTRAVENOUS

## 2018-11-28 MED ORDER — CELECOXIB 200 MG PO CAPS
200.0000 mg | ORAL_CAPSULE | ORAL | Status: AC
  Administered 2018-11-28: 200 mg via ORAL

## 2018-11-28 MED ORDER — PROPOFOL 10 MG/ML IV BOLUS
INTRAVENOUS | Status: AC
  Filled 2018-11-28: qty 20

## 2018-11-28 MED ORDER — ACETAMINOPHEN 500 MG PO TABS
ORAL_TABLET | ORAL | Status: AC
  Administered 2018-11-28: 1000 mg via ORAL
  Filled 2018-11-28: qty 2

## 2018-11-28 MED ORDER — ONDANSETRON HCL 4 MG/2ML IJ SOLN
INTRAMUSCULAR | Status: AC
  Filled 2018-11-28: qty 2

## 2018-11-28 MED ORDER — FENTANYL CITRATE (PF) 100 MCG/2ML IJ SOLN
100.0000 ug | Freq: Once | INTRAMUSCULAR | Status: AC
  Administered 2018-11-28: 100 ug via INTRAVENOUS

## 2018-11-28 MED ORDER — SUCCINYLCHOLINE CHLORIDE 200 MG/10ML IV SOSY
PREFILLED_SYRINGE | INTRAVENOUS | Status: DC | PRN
  Administered 2018-11-28: 160 mg via INTRAVENOUS

## 2018-11-28 MED ORDER — OXYCODONE HCL 5 MG PO TABS
ORAL_TABLET | ORAL | Status: AC
  Filled 2018-11-28: qty 1

## 2018-11-28 MED ORDER — FENTANYL CITRATE (PF) 100 MCG/2ML IJ SOLN
INTRAMUSCULAR | Status: AC
  Administered 2018-11-28: 100 ug via INTRAVENOUS
  Filled 2018-11-28: qty 2

## 2018-11-28 MED ORDER — OXYCODONE HCL 5 MG PO TABS: 5.0000 mg | ORAL_TABLET | Freq: Four times a day (QID) | ORAL | 0 refills | Status: DC | PRN

## 2018-11-28 SURGICAL SUPPLY — 76 items
APPLIER CLIP 9.375 MED OPEN (MISCELLANEOUS)
BAG DECANTER FOR FLEXI CONT (MISCELLANEOUS) ×4 IMPLANT
BINDER BREAST LRG (GAUZE/BANDAGES/DRESSINGS) IMPLANT
BINDER BREAST XLRG (GAUZE/BANDAGES/DRESSINGS) IMPLANT
BLADE SURG 15 STRL LF DISP TIS (BLADE) ×2 IMPLANT
BLADE SURG 15 STRL SS (BLADE) ×2
CANISTER SUCT 3000ML PPV (MISCELLANEOUS) ×4 IMPLANT
CHLORAPREP W/TINT 10.5 ML (MISCELLANEOUS) ×4 IMPLANT
CHLORAPREP W/TINT 26ML (MISCELLANEOUS) ×4 IMPLANT
CLIP APPLIE 9.375 MED OPEN (MISCELLANEOUS) IMPLANT
CLIP VESOCCLUDE MED 6/CT (CLIP) ×4 IMPLANT
CONT SPEC 4OZ CLIKSEAL STRL BL (MISCELLANEOUS) ×4 IMPLANT
COVER PROBE W GEL 5X96 (DRAPES) ×4 IMPLANT
COVER SURGICAL LIGHT HANDLE (MISCELLANEOUS) ×4 IMPLANT
COVER TRANSDUCER ULTRASND GEL (DRAPE) ×4 IMPLANT
COVER WAND RF STERILE (DRAPES) ×4 IMPLANT
CRADLE DONUT ADULT HEAD (MISCELLANEOUS) ×4 IMPLANT
DECANTER SPIKE VIAL GLASS SM (MISCELLANEOUS) IMPLANT
DERMABOND ADVANCED (GAUZE/BANDAGES/DRESSINGS) ×4
DERMABOND ADVANCED .7 DNX12 (GAUZE/BANDAGES/DRESSINGS) ×4 IMPLANT
DEVICE DUBIN SPECIMEN MAMMOGRA (MISCELLANEOUS) ×4 IMPLANT
DRAPE C-ARM 42X72 X-RAY (DRAPES) ×4 IMPLANT
DRAPE CHEST BREAST 15X10 FENES (DRAPES) ×4 IMPLANT
DRAPE UTILITY XL STRL (DRAPES) ×4 IMPLANT
DRSG PAD ABDOMINAL 8X10 ST (GAUZE/BANDAGES/DRESSINGS) ×4 IMPLANT
ELECT CAUTERY BLADE 6.4 (BLADE) ×4 IMPLANT
ELECT COATED BLADE 2.86 ST (ELECTRODE) ×4 IMPLANT
ELECT REM PT RETURN 9FT ADLT (ELECTROSURGICAL) ×4
ELECTRODE REM PT RTRN 9FT ADLT (ELECTROSURGICAL) ×2 IMPLANT
GAUZE 4X4 16PLY RFD (DISPOSABLE) ×4 IMPLANT
GEL ULTRASOUND 20GR AQUASONIC (MISCELLANEOUS) IMPLANT
GLOVE BIOGEL PI IND STRL 7.0 (GLOVE) ×2 IMPLANT
GLOVE BIOGEL PI IND STRL 8 (GLOVE) ×2 IMPLANT
GLOVE BIOGEL PI INDICATOR 7.0 (GLOVE) ×2
GLOVE BIOGEL PI INDICATOR 8 (GLOVE) ×2
GLOVE ECLIPSE 7.5 STRL STRAW (GLOVE) ×8 IMPLANT
GLOVE SURG SS PI 7.0 STRL IVOR (GLOVE) ×4 IMPLANT
GOWN STRL REUS W/ TWL LRG LVL3 (GOWN DISPOSABLE) ×2 IMPLANT
GOWN STRL REUS W/ TWL XL LVL3 (GOWN DISPOSABLE) ×2 IMPLANT
GOWN STRL REUS W/TWL LRG LVL3 (GOWN DISPOSABLE) ×2
GOWN STRL REUS W/TWL XL LVL3 (GOWN DISPOSABLE) ×2
ILLUMINATOR WAVEGUIDE N/F (MISCELLANEOUS) IMPLANT
INTRODUCER COOK 11FR (CATHETERS) IMPLANT
IV CATH 14GX2 1/4 (CATHETERS) IMPLANT
KIT BASIN OR (CUSTOM PROCEDURE TRAY) ×4 IMPLANT
KIT MARKER MARGIN INK (KITS) ×4 IMPLANT
KIT PORT POWER 8FR ISP CVUE (Port) ×4 IMPLANT
KIT TURNOVER KIT B (KITS) ×4 IMPLANT
NDL SAFETY ECLIPSE 18X1.5 (NEEDLE) IMPLANT
NEEDLE 22X1 1/2 (OR ONLY) (NEEDLE) ×4 IMPLANT
NEEDLE FILTER BLUNT 18X 1/2SAF (NEEDLE)
NEEDLE FILTER BLUNT 18X1 1/2 (NEEDLE) IMPLANT
NEEDLE HYPO 18GX1.5 SHARP (NEEDLE)
NEEDLE HYPO 25GX1X1/2 BEV (NEEDLE) ×4 IMPLANT
NS IRRIG 1000ML POUR BTL (IV SOLUTION) ×4 IMPLANT
PACK SURGICAL SETUP 50X90 (CUSTOM PROCEDURE TRAY) ×4 IMPLANT
PAD ARMBOARD 7.5X6 YLW CONV (MISCELLANEOUS) ×4 IMPLANT
PENCIL BUTTON HOLSTER BLD 10FT (ELECTRODE) ×4 IMPLANT
SET INTRODUCER 12FR PACEMAKER (INTRODUCER) IMPLANT
SET SHEATH INTRODUCER 10FR (MISCELLANEOUS) IMPLANT
SHEATH COOK PEEL AWAY SET 9F (SHEATH) IMPLANT
SPONGE LAP 18X18 RF (DISPOSABLE) ×4 IMPLANT
SUT MON AB 5-0 PS2 18 (SUTURE) ×8 IMPLANT
SUT PROLENE 2 0 SH DA (SUTURE) ×4 IMPLANT
SUT SILK 2 0 (SUTURE)
SUT SILK 2-0 18XBRD TIE 12 (SUTURE) IMPLANT
SUT VIC AB 3-0 SH 18 (SUTURE) ×4 IMPLANT
SYR 5ML LUER SLIP (SYRINGE) ×4 IMPLANT
SYR BULB 3OZ (MISCELLANEOUS) ×4 IMPLANT
SYR CONTROL 10ML LL (SYRINGE) ×4 IMPLANT
TOWEL OR 17X24 6PK STRL BLUE (TOWEL DISPOSABLE) ×4 IMPLANT
TOWEL OR 17X26 10 PK STRL BLUE (TOWEL DISPOSABLE) ×4 IMPLANT
TRAY LAPAROSCOPIC MC (CUSTOM PROCEDURE TRAY) ×4 IMPLANT
TUBE CONNECTING 12'X1/4 (SUCTIONS) ×1
TUBE CONNECTING 12X1/4 (SUCTIONS) ×3 IMPLANT
YANKAUER SUCT BULB TIP NO VENT (SUCTIONS) ×4 IMPLANT

## 2018-11-28 NOTE — Discharge Instructions (Signed)
[facility] °Office Phone Number [phone] ° °BREAST BIOPSY/ PARTIAL MASTECTOMY: POST OP INSTRUCTIONS ° °Always review your discharge instruction sheet given to you by the facility where your surgery was performed. ° °IF YOU HAVE DISABILITY OR FAMILY LEAVE FORMS, YOU MUST BRING THEM TO THE OFFICE FOR PROCESSING.  DO NOT GIVE THEM TO YOUR DOCTOR. ° °1. A prescription for pain medication may be given to you upon discharge.  Take your pain medication as prescribed, if needed.  If narcotic pain medicine is not needed, then you may take acetaminophen (Tylenol) or ibuprofen (Advil) as needed. °2. Take your usually prescribed medications unless otherwise directed °3. If you need a refill on your pain medication, please contact your pharmacy.  They will contact our office to request authorization.  Prescriptions will not be filled after 5pm or on week-ends. °4. You should eat very light the first 24 hours after surgery, such as soup, crackers, pudding, etc.  Resume your normal diet the day after surgery. °5. Most patients will experience some swelling and bruising in the breast.  Ice packs and a good support bra will help.  Swelling and bruising can take several days to resolve.  °6. It is common to experience some constipation if taking pain medication after surgery.  Increasing fluid intake and taking a stool softener will usually help or prevent this problem from occurring.  A mild laxative (Milk of Magnesia or Miralax) should be taken according to package directions if there are no bowel movements after 48 hours. °7. Unless discharge instructions indicate otherwise, you may remove your bandages 24-48 hours after surgery, and you may shower at that time.  You may have steri-strips (small skin tapes) in place directly over the incision.  These strips should be left on the skin for 7-10 days.  If your surgeon used skin glue on the incision, you may shower in 24 hours.  The glue will flake off over the  next 2-3 weeks.  Any sutures or staples will be removed at the office during your follow-up visit. °8. ACTIVITIES:  You may resume regular daily activities (gradually increasing) beginning the next day.  Wearing a good support bra or sports bra minimizes pain and swelling.  You may have sexual intercourse when it is comfortable. °a. You may drive when you no longer are taking prescription pain medication, you can comfortably wear a seatbelt, and you can safely maneuver your car and apply brakes. °b. RETURN TO WORK:  ______________________________________________________________________________________ °9. You should see your doctor in the office for a follow-up appointment approximately two weeks after your surgery.  Your doctor’s nurse will typically make your follow-up appointment when she calls you with your pathology report.  Expect your pathology report 2-3 business days after your surgery.  You may call to check if you do not hear from us after three days. °10. OTHER INSTRUCTIONS: _______________________________________________________________________________________________ _____________________________________________________________________________________________________________________________________ °_____________________________________________________________________________________________________________________________________ °_____________________________________________________________________________________________________________________________________ ° °WHEN TO CALL YOUR DOCTOR: °1. Fever over 101.0 °2. Nausea and/or vomiting. °3. Extreme swelling or bruising. °4. Continued bleeding from incision. °5. Increased pain, redness, or drainage from the incision. ° °The clinic staff is available to answer your questions during regular business hours.  Please don’t hesitate to call and ask to speak to one of the nurses for clinical concerns.  If you have a medical emergency, go to the nearest  emergency room or call 911.  A surgeon from [facility] is always on call at the hospital. ° °For further questions, please visit [email or web address]  °

## 2018-11-28 NOTE — Transfer of Care (Signed)
Immediate Anesthesia Transfer of Care Note  Patient: Summer [name]  Procedure(s) Performed: RIGHT BREAST LUMPECTOMY WITH RADIOACTIVE SEED AND RIGHT AXILLARY SENTINEL LYMPH NODE BIOPSY (Right Breast) INSERTION PORT-A-CATH WITH ULTRASOUND (Left Chest)  Patient Location: PACU  Anesthesia Type:GA combined with regional for post-op pain  Level of Consciousness: awake and alert   Airway & Oxygen Therapy: Patient Spontanous Breathing and Patient connected to face mask oxygen  Post-op Assessment: Report given to RN and Post -op Vital signs reviewed and stable  Post vital signs: Reviewed and stable  Last Vitals:  Vitals Value Taken Time  BP 141/68 11/28/2018  2:40 PM  Temp    Pulse 93 11/28/2018  2:43 PM  Resp 13 11/28/2018  2:44 PM  SpO2 100 % 11/28/2018  2:43 PM  Vitals shown include unvalidated device data.  Last Pain:  Vitals:   11/28/18 1155  TempSrc:   PainSc: 0-No pain         Complications: No apparent anesthesia complications

## 2018-11-28 NOTE — Anesthesia Procedure Notes (Signed)
Procedure Name: Intubation Performed by: Milford Cage, CRNA Pre-anesthesia Checklist: Patient identified, Emergency Drugs available, Suction available and Patient being monitored Patient Re-evaluated:Patient Re-evaluated prior to induction Oxygen Delivery Method: Circle System Utilized Preoxygenation: Pre-oxygenation with 100% oxygen Induction Type: IV induction and Rapid sequence Laryngoscope Size: Mac and 4 Grade View: Grade I Tube type: Oral Tube size: 7.0 mm Number of attempts: 1 Airway Equipment and Method: Stylet and Oral airway Placement Confirmation: ETT inserted through vocal cords under direct vision,  positive ETCO2 and breath sounds checked- equal and bilateral Secured at: 22 cm Tube secured with: Tape Dental Injury: Teeth and Oropharynx as per pre-operative assessment

## 2018-11-28 NOTE — Op Note (Signed)
Preoperative Diagnosis: right breast cancer  Postoprative Diagnosis: right breast cancer  Procedure: Procedure(s): Blue dye injection right breast, RIGHT BREAST LUMPECTOMY WITH RADIOACTIVE SEED AND RIGHT AXILLARY SENTINEL LYMPH NODE BIOPSY INSERTION PORT-A-CATH WITH ULTRASOUND guidance and fluoroscopy   Surgeon: Excell Seltzer T   Assistants: None  Anesthesia:  General endotracheal anesthesia  Indications: 68 year old female with a new diagnosis of cancer of the right breast, upper outer quadrant. Clinical stage 1B, ER 95% positive, PR 5% positive, HER-2 positive.  She will require adjuvant chemotherapy.  After extensive preoperative work-up and discussion we have elected to proceed with placement of Port-A-Cath, radioactive seed localized right breast lumpectomy and axillary sentinel lymph node biopsy as initial surgical therapy.    Procedure Detail: In the preoperative area the patient underwent a pectoral block and underwent injection of 1 mCi of technetium sulfur colloid intradermally around the right nipple.  Seed placement was confirmed with the neoprobe in the holding area.  She was brought to the operating room, placed in the supine position on the operating table, and general endotracheal anesthesia induced. The Port-A-Cath was placed initially.  She was positioned with arms tucked and the entire anterior neck and chest were widely sterilely prepped and draped.  She received preoperative IV antibiotics.  Patient timeout was performed and correct procedure verified.  Ultrasound was used to localize an adequate left internal jugular vein.  Under continuous ultrasound visualization the right internal jugular vein was cannulated with a needle and guidewire.  Fluoroscopy confirmed the guidewire threaded down to the inferior vena cava.  The introducer was then placed via the guidewire and the flush catheter threaded through the introducer which was stripped away.  The tip of the catheter  was positioned at the cavoatrial junction with fluoroscopic guidance.  A site on the left anterior chest wall was chosen and after injecting local anesthesia a small transverse incision made a subcutaneous pocket created.  The catheter was then tunneled subcutaneously to the pocket, trimmed to length, and attached to the port which was sutured to the anterior chest wall with interrupted 2-0 Prolene.  Fluoroscopy was again used to confirm good position of the catheter as above.  Incisions were closed with subcutaneous interrupted 5-0 Monocryl in subcuticular 5-0 Monocryl.  The catheter flushed and aspirated easily and was left flushed with concentrated heparin solution.  Skin was closed with Dermabond. Patient was repositioned with her right arm extended and the entire right anterior chest and breast, axilla and upper arm were widely sterilely prepped and draped.  Patient timeout was repeated.  I was unable to find much in the way of elevated counts initially in the right axilla with the neoprobe and I therefore injected 5 cc of dilute methylene blue subcutaneously beneath the right nipple and massage this for several minutes.  The lumpectomy was approached initially.  The seed was localized in the lateral slightly inferior right breast.  A curvilinear incision was made in the skin crease overlying the seed and dissection carried down through the subcutaneous tissue to the breast capsule.  Using the neoprobe for guidance I excised with cautery a globular approximately 4 to 5 cm specimen of breast tissue around the area of high counts.  The specimen was inked for margins.  Specimen x-ray was obtained showing the seed and marking clip centrally located within the specimen.  This was sent for permanent pathology.  Complete hemostasis was obtained in the wound.  The lumpectomy cavity was marked with clips.  The wound  was closed in layers with interrupted 3-0 Vicryl and skin with running subcuticular 5-0  Monocryl. Attention was turned to the sentinel node biopsy.  At this point with the neoprobe I was able to identify one area of slightly elevated counts.  A small transverse incision was made in a skin crease in the axilla at this point and dissection carried down through the subcutaneous tissue.  The clavipectoral fascia was identified and incised.  Using the neoprobe for guidance I was able to find a moderately hot lymph node which was also identified containing bright blue dye.  It was elevated and completely excised with cautery.  Normal size.  Ex vivo the node had counts of about 90.  Background throughout the axilla at this point was 0.  There was no palpable adenopathy and I did not see any other blue dye.  This was sent as hot blue axillary sentinel lymph node.  Complete hemostasis was assured in this wound.  The deep axillary and subcutaneous tissue was closed with interrupted 3-0 Vicryl.  Skin was closed with subcuticular 5-0 Monocryl.  Both incisions were infiltrated with Marcaine.  Dermabond applied to both incisions.  Sponge needle and instrument counts were correct.    Findings: As above   Estimated Blood Loss:  less than 50 mL         Drains: None  Blood Given: none          Specimens: #1 right breast lumpectomy   #2 hot blue right axillary sentinel lymph node        Complications:  * No complications entered in OR log *         Disposition: PACU - hemodynamically stable.         Condition: stable

## 2018-11-28 NOTE — Anesthesia Procedure Notes (Signed)
Anesthesia Regional Block: Pectoralis block   Pre-Anesthetic Checklist: ,, timeout performed, Correct Patient, Correct Site, Correct Laterality, Correct Procedure, Correct Position, site marked, Risks and benefits discussed,  Surgical consent,  Pre-op evaluation,  At surgeon's request and post-op pain management  Laterality: Right  Prep: Maximum Sterile Barrier Precautions used, chloraprep       Needles:  Injection technique: Single-shot  Needle Type: Echogenic Stimulator Needle     Needle Length: 9cm  Needle Gauge: 22     Additional Needles:   Procedures:,,,, ultrasound used (permanent image in chart),,,,  Narrative:  Start time: 11/28/2018 11:35 AM End time: 11/28/2018 11:45 AM Injection made incrementally with aspirations every 5 mL.  Performed by: Personally  Anesthesiologist: Freddrick March, MD  Additional Notes: Monitors applied. No increased pain on injection. No increased resistance to injection. Injection made in 5cc increments. Good needle visualization. Patient tolerated procedure well.

## 2018-11-28 NOTE — ED Triage Notes (Signed)
Pt had a procedure to right breast today.  Partial dehiscence to incision noted.  Bleeding controlled.  Pt reports not speaking w/ the surgeon before or after procedure.

## 2018-11-28 NOTE — Interval H&P Note (Signed)
History and Physical Interval Note:  11/28/2018 12:01 PM  Summer [name]  has presented today for surgery, with the diagnosis of right breast cancer.  The various methods of treatment have been discussed with the patient and family. After consideration of risks, benefits and other options for treatment, the patient has consented to  Procedure(s): RIGHT BREAST LUMPECTOMY WITH RADIOACTIVE SEED AND RIGHT AXILLARY SENTINEL LYMPH NODE BIOPSY (Right) INSERTION PORT-A-CATH WITH ULTRASOUND (N/A) as a surgical intervention.  The patient's history has been reviewed, patient examined, no change in status, stable for surgery.  I have reviewed the patient's chart and labs.  Questions were answered to the patient's satisfaction.     Darene Lamer Starsky Nanna

## 2018-11-29 ENCOUNTER — Emergency Department (HOSPITAL_COMMUNITY)
Admission: EM | Admit: 2018-11-29 | Discharge: 2018-11-30 | Disposition: A | Discharge status: Home / Self Care | Payer: Federal, State, Local not specified - PPO | Attending: Emergency Medicine | Admitting: Emergency Medicine

## 2018-11-29 ENCOUNTER — Encounter (HOSPITAL_COMMUNITY): Payer: Self-pay | Admitting: Oncology

## 2018-11-29 ENCOUNTER — Other Ambulatory Visit: Payer: Self-pay

## 2018-11-29 DIAGNOSIS — Z853 Personal history of malignant neoplasm of breast: Secondary | ICD-10-CM | POA: Insufficient documentation

## 2018-11-29 DIAGNOSIS — Y658 Other specified misadventures during surgical and medical care: Secondary | ICD-10-CM | POA: Diagnosis not present

## 2018-11-29 DIAGNOSIS — T8130XA Disruption of wound, unspecified, initial encounter: Secondary | ICD-10-CM

## 2018-11-29 NOTE — ED Provider Notes (Signed)
[facility] EMERGENCY DEPARTMENT Provider Note   CSN: [id number] Arrival date & time: 11/28/18  2306    History   Chief Complaint Chief Complaint  Patient presents with   Post-op Problem    HPI Summer [name] is a 68 y.o. female.     HPI   68 year old female here with dehiscence of recent operative site.  The patient was just recently diagnosed with breast cancer.  She underwent lumpectomy today, and states she had been doing well until she woke up and felt some oozing along her inferior wound.  She states that she noticed a small area of dehiscence along the lateral aspect of her inferior breast surgical site.  She has a mild stabbing pain at the site.  No erythema.  No drainage currently.  She called the on-call surgeon, and was told to come in for evaluation.  No history of poor wound healing.  No known history of diabetes.  No other complaints.  The surgery was performed around noon today with Dr. Excell Seltzer.  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   Diverticulosis    Fibromyalgia    History of kidney stones    History of kidney stones    History of shingles 2012--  no residual pain   Left ureteral calculus    MCI (mild cognitive impairment)    Trigeminal neuralgia RIGHT   Urgency of urination     Patient Active Problem List   Diagnosis Date Noted   Anemia, iron deficiency 11/21/2018   Malignant neoplasm of upper-outer quadrant of right breast in female, estrogen receptor positive ([facility]) 11/17/2018   Itching 10/13/2018   Left eye pain 07/01/2018   Constipation 07/01/2018   Weight loss 07/22/2017   Memory loss 12/21/2016   Tremor 12/21/2016   Right sided temporal headache 12/21/2016   Trochanteric bursitis, right hip 10/26/2016   Pruritus 10/26/2016   Foot sprain, left, initial encounter 08/31/2016    Pain of right side of body 08/31/2016   Headache 04/24/2016   LLQ pain 04/24/2016   Glossitis 02/02/2016   Routine general medical examination at a health care facility 10/27/2015   Facial pain 10/06/2015   Trigeminal neuralgia of right side of face 11/30/2010   Morbid obesity ([place]) 11/09/2010   Fibromyalgia 11/09/2010   HEMATURIA, HX OF 11/14/2009   LOC OSTEOARTHROS NOT SPEC PRIM/SEC OTH SPEC SITE 06/27/2009   Allergic rhinitis 08/26/2008   VULVA INTRAEPITHELIAL NEOPLASIA, 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    Past Surgical History:  Procedure Laterality Date   CEREBRAL MICROVASCULAR DECOMPRESSION  07-05-2006   RIGHT TRIGEMINAL NERVE   CYSTOSCOPY/RETROGRADE/URETEROSCOPY/STONE EXTRACTION WITH BASKET  07/04/2012   Procedure: CYSTOSCOPY/RETROGRADE/URETEROSCOPY/STONE EXTRACTION WITH BASKET;  Surgeon: Claybon Jabs, MD;  Location: [place];  Service: Urology;  Laterality: Left;   EXPLORATORY LAPAROTOMY/ RESECTION MID TO DISTAL SIGMOID AND PROXIMAL RECTUM/ END PROXIMAL SIGMOID COLOSTOMY  07-17-2006   PERFORATED DIVERTICULITIS WITH PERITONITIS   gamma knife  05/17/2016   for trigeminal neuralgia, [facility], Dr Salomon Fick   gamma knife  2019   RESECTION COLOSTOMY/ CLOSURE COLOSTOMY WITH COLOPROCTOSTOMY  02-20-2007   TONSILLECTOMY  AS CHILD   URETEROSCOPY  07/04/2012   Procedure: URETEROSCOPY;  Surgeon: Claybon Jabs, MD;  Location: [facility];  Service: Urology;  Laterality: Left;   VAGINAL HYSTERECTOMY  1998   Partial   WISDOM TOOTH  EXTRACTION       OB History   No obstetric history on file.      Home Medications    Prior to Admission medications   Medication Sig Start Date End Date Taking? Authorizing Provider  acetaminophen (TYLENOL) 500 MG tablet Take 1,000 mg by mouth every 6 (six) hours as needed (pain).     [provider]  gabapentin (NEURONTIN) 600 MG tablet TAKE 2 TABLETS TID. MAX OF SIX TABS PER DAY. Patient taking  differently: Take 1,200 mg by mouth 3 (three) times daily. TAKE 2 TABLETS TID. MAX OF SIX TABS PER DAY. 10/14/18   Marcial Pacas, MD  LamoTRIgine 200 MG TB24 24 hour tablet Take 1 tablet (200 mg total) by mouth at bedtime. 10/06/18   Marcial Pacas, MD  meloxicam (MOBIC) 15 MG tablet Take 1 tablet (15 mg total) by mouth daily. 11/11/18   Landis Martins, DPM  oxyCODONE (OXY IR/ROXICODONE) 5 MG immediate release tablet Take 1 tablet (5 mg total) by mouth every 6 (six) hours as needed for moderate pain, severe pain or breakthrough pain. 11/28/18   Excell Seltzer, MD    Family History Family History  Problem Relation Age of Onset   Heart disease Maternal Grandmother    Diabetes Mother    Breast cancer Maternal Aunt    Lupus Cousin    Colon cancer Neg Hx    Esophageal cancer Neg Hx    Rectal cancer Neg Hx    Stomach cancer Neg Hx     Social History Social History   Tobacco Use   Smoking status: Never Smoker   Smokeless tobacco: Never Used  Substance Use Topics   Alcohol use: Yes    Comment: rarely   Drug use: No     Allergies   Clindamycin hcl   Review of Systems Review of Systems  Constitutional: Negative for chills and fever.  HENT: Negative for congestion, rhinorrhea and sore throat.   Eyes: Negative for visual disturbance.  Respiratory: Negative for cough, shortness of breath and wheezing.   Cardiovascular: Negative for chest pain and leg swelling.  Gastrointestinal: Negative for abdominal pain, diarrhea, nausea and vomiting.  Genitourinary: Negative for dysuria, flank pain, vaginal bleeding and vaginal discharge.  Musculoskeletal: Negative for neck pain.  Skin: Positive for wound. Negative for rash.  Allergic/Immunologic: Negative for immunocompromised state.  Neurological: Negative for syncope and headaches.  Hematological: Does not bruise/bleed easily.  All other systems reviewed and are negative.    Physical Exam Updated Vital Signs BP (!) 101/50 (BP  Location: Left Arm)    Pulse 73    Temp 98.7 F (37.1 C)    Resp 17    Ht 5' 6"  (1.676 m)    Wt 86 kg    SpO2 96%    BMI 30.60 kg/m   Physical Exam Vitals signs and nursing note reviewed.  Constitutional:      General: She is not in acute distress.    Appearance: She is well-developed.  HENT:     Head: Normocephalic and atraumatic.  Eyes:     Conjunctiva/sclera: Conjunctivae normal.  Neck:     Musculoskeletal: Neck supple.  Cardiovascular:     Rate and Rhythm: Normal rate and regular rhythm.     Heart sounds: Normal heart sounds. No murmur. No friction rub.  Pulmonary:     Effort: Pulmonary effort is normal. No respiratory distress.     Breath sounds: Normal breath sounds. No wheezing or rales.  Abdominal:  General: There is no distension.     Palpations: Abdomen is soft.     Tenderness: There is no abdominal tenderness.  Skin:    General: Skin is warm.     Capillary Refill: Capillary refill takes less than 2 seconds.  Neurological:     Mental Status: She is alert and oriented to person, place, and time.     Motor: No abnormal muscle tone.    Right superior surgical site clean, dry, and largely intact. Right inferior breast surgical site as below.     ED Treatments / Results  Labs (all labs ordered are listed, but only abnormal results are displayed) Labs Reviewed - No data to display  EKG None  Radiology Nm Sentinel Node Inj-no Rpt (breast)  Result Date: 11/28/2018 Sulfur colloid was injected by the nuclear medicine technologist for melanoma sentinel node.   Mm Breast Surgical Specimen  Result Date: 11/28/2018 CLINICAL DATA:  Right lumpectomy for recently diagnosed invasive ductal carcinoma. EXAM: SPECIMEN RADIOGRAPH OF THE RIGHT BREAST COMPARISON:  Previous exam(s). FINDINGS: Status post excision of the right breast. The radioactive seed and ribbon shaped biopsy marker clip are present, completely intact, and were marked for pathology. IMPRESSION: Specimen  radiograph of the right breast. Electronically Signed   By: Claudie Revering M.D.   On: 11/28/2018 13:49   Dg Chest Port 1 View  Result Date: 11/28/2018 CLINICAL DATA:  Post op EXAM: PORTABLE CHEST 1 VIEW COMPARISON:  02/08/2017 FINDINGS: Status post placement of LEFT-sided power port, tip overlying the level of the superior vena cava. Heart size is accentuated by the portable technique. The lungs are clear. No edema. Line placement. No pneumothorax following IMPRESSION: LEFT-sided power port without evidence for complications. No evidence for acute cardiopulmonary abnormality. Electronically Signed   By: Nolon Nations M.D.   On: 11/28/2018 15:51   Dg Fluoro Guide Cv Line-no Report  Result Date: 11/28/2018 Fluoroscopy was utilized by the requesting physician.  No radiographic interpretation.   Mm Rt Radioactive Seed Loc Mammo Guide  Result Date: 11/27/2018 CLINICAL DATA:  Preoperative seed localization for right breast malignancy. EXAM: MAMMOGRAPHIC GUIDED RADIOACTIVE SEED LOCALIZATION OF THE RIGHT BREAST COMPARISON:  Previous exam(s). FINDINGS: Patient presents for radioactive seed localization prior to right breast lumpectomy. I met with the patient and we discussed the procedure of seed localization including benefits and alternatives. We discussed the high likelihood of a successful procedure. We discussed the risks of the procedure including infection, bleeding, tissue injury and further surgery. We discussed the low dose of radioactivity involved in the procedure. Informed, written consent was given. The usual time-out protocol was performed immediately prior to the procedure. Using mammographic guidance, sterile technique, 1% lidocaine and an I-125 radioactive seed, ribbon shaped post biopsy marker was localized using a lateral approach. The follow-up mammogram images confirm the seed in the expected location and were marked for Dr. Excell Seltzer. Follow-up survey of the patient confirms presence of the  radioactive seed. Order number of I-125 seed:  [id number]. Total activity:  6.967 millicurie reference Date: October 31, 2018 The patient tolerated the procedure well and was released from the [facility]. She was given instructions regarding seed removal. IMPRESSION: Radioactive seed localization right breast. No apparent complications. Electronically Signed   By: Fidela Salisbury M.D.   On: 11/27/2018 15:34    Procedures Procedures (including critical care time)  Medications Ordered in ED Medications - No data to display   Initial Impression / Assessment and Plan / ED Course  I  have reviewed the triage vital signs and the nursing notes.  Pertinent labs & imaging results that were available during my care of the patient were reviewed by me and considered in my medical decision making (see chart for details).        Very well-appearing 68 year old female here for evaluation of dehiscence of surgical site, status post lumpectomy with Dr. Excell Seltzer today.  Exam as above.  Patient has very minimal area of dehiscence that does not appear infected or actively bleeding.  This case was discussed and image reviewed with on-call physician, Dr. Grandville Silos.  The wound was cleansed and sterilized with Betadine.  Steri-Strips were then applied to approximate the wound, as per Dr. Biagio Borg instructions. Will discharge with outpatient follow-up with surgery.  Return precautions given.  Final Clinical Impressions(s) / ED Diagnoses   Final diagnoses:  Postoperative dehiscence of skin wound, initial encounter    ED Discharge Orders    None       Duffy Bruce, MD 11/29/18 [phone]

## 2018-11-29 NOTE — Discharge Instructions (Signed)
Keep your wound clean and dry. Call the surgery office on Monday. Return to the ED with any fever, chills, or worsening pain.

## 2018-11-29 NOTE — ED Provider Notes (Signed)
Emergency Department Provider Note   I have reviewed the triage vital signs and the nursing notes.   HISTORY  Chief Complaint Post-op Problem   HPI Summer [name] is a 68 y.o. female POD 1 s/p right lumpectomy with Dr. Excell Seltzer presents to the emergency department with worsening right breast wound dehiscence.  Patient had a small area of opening and drainage at was seen at midnight.  The EDP at that time spoke with the surgeon on call who advised cleaning the wound and Steri-Strip placement.  Patient was discharged home.  She states she had no issues today.  No fevers.  Wound has extended and she noticed this with increased drainage.  When she looked she saw the larger wound than before. Pain only with touching the area.   Past Medical History:  Diagnosis Date  . Arthritis shoulders and back  . 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 Diverticulosis   . Fibromyalgia   . History of kidney stones   . History of kidney stones   . History of shingles 2012--  no residual pain  . Left ureteral calculus   . MCI (mild cognitive impairment)   . Trigeminal neuralgia RIGHT  . Urgency of urination     Patient Active Problem List   Diagnosis Date Noted  . Anemia, iron deficiency 11/21/2018  . Malignant neoplasm of upper-outer quadrant of right breast in female, estrogen receptor positive ([place]) 11/17/2018  . Itching 10/13/2018  . Left eye pain 07/01/2018  . Constipation 07/01/2018  . Weight loss 07/22/2017  . Memory loss 12/21/2016  . Tremor 12/21/2016  . Right sided temporal headache 12/21/2016  . Trochanteric bursitis, right hip 10/26/2016  . Pruritus 10/26/2016  . Foot sprain, left, initial encounter 08/31/2016  . Pain of right side of body 08/31/2016  . Headache 04/24/2016  . LLQ pain 04/24/2016  . Glossitis 02/02/2016  . Routine general medical examination at a health care facility 10/27/2015  . Facial pain  10/06/2015  . Trigeminal neuralgia of right side of face 11/30/2010  . Morbid obesity ([place]) 11/09/2010  . Fibromyalgia 11/09/2010  . HEMATURIA, HX OF 11/14/2009  . LOC OSTEOARTHROS NOT SPEC PRIM/SEC OTH [facility] SITE 06/27/2009  . Allergic rhinitis 08/26/2008  . 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 CEREBRAL MICROVASCULAR DECOMPRESSION  07-05-2006   RIGHT TRIGEMINAL NERVE  . CYSTOSCOPY/RETROGRADE/URETEROSCOPY/STONE EXTRACTION WITH BASKET  07/04/2012   Procedure: CYSTOSCOPY/RETROGRADE/URETEROSCOPY/STONE EXTRACTION WITH BASKET;  Surgeon: Claybon Jabs, MD;  Location: [facility];  Service: Urology;  Laterality: Left;  . EXPLORATORY LAPAROTOMY/ RESECTION MID TO DISTAL SIGMOID AND PROXIMAL RECTUM/ END PROXIMAL SIGMOID COLOSTOMY  07-17-2006   PERFORATED DIVERTICULITIS WITH PERITONITIS  . gamma knife  05/17/2016   for trigeminal neuralgia, [facility], Dr Salomon Fick  . gamma knife  2019  . RESECTION COLOSTOMY/ CLOSURE COLOSTOMY WITH COLOPROCTOSTOMY  02-20-2007  . TONSILLECTOMY  AS CHILD  . URETEROSCOPY  07/04/2012   Procedure: URETEROSCOPY;  Surgeon: Claybon Jabs, MD;  Location: [facility];  Service: Urology;  Laterality: Left;  Marland Kitchen VAGINAL HYSTERECTOMY  1998   Partial  . WISDOM TOOTH EXTRACTION     Allergies Clindamycin hcl  Family History  Problem Relation Age of Onset  . Heart disease Maternal Grandmother   . Diabetes Mother   . Breast cancer Maternal Aunt   . Lupus Cousin   .  Colon cancer Neg Hx   . Esophageal cancer Neg Hx   . Rectal cancer Neg Hx   . Stomach cancer Neg Hx     Social History Social History   Tobacco Use  . Smoking status: Never Smoker  . Smokeless tobacco: Never Used  Substance Use Topics  . Alcohol use: Yes    Comment: rarely  . Drug use: No    Review of Systems  Constitutional: No fever/chills Eyes: No visual changes. ENT: No sore throat.  Cardiovascular: Denies chest pain. Respiratory: Denies shortness of breath. Gastrointestinal: No abdominal pain.  No nausea, no vomiting.  No diarrhea.  No constipation. Genitourinary: Negative for dysuria. Musculoskeletal: Negative for back pain. Skin: Right breast wound dehiscence.  Neurological: Negative for headaches, focal weakness or numbness.  10-point ROS otherwise negative.  ____________________________________________   PHYSICAL EXAM:  VITAL SIGNS: ED Triage Vitals  Enc Vitals Group     BP 11/29/18 2117 138/60     Pulse Rate 11/29/18 2117 78     Resp 11/29/18 2117 15     Temp 11/29/18 2117 98.7 F (37.1 C)     Temp Source 11/29/18 2117 Oral     SpO2 11/29/18 2117 100 %     Weight 11/29/18 2118 189 lb 9.5 oz (86 kg)     Height 11/29/18 2118 5\' 6"  (1.676 m)   Constitutional: Alert and oriented. Well appearing and in no acute distress. Eyes: Conjunctivae are normal.  Head: Atraumatic. Nose: No congestion/rhinnorhea. Mouth/Throat: Mucous membranes are moist. Neck: No stridor. Cardiovascular: Normal rate, regular rhythm. Good peripheral circulation. Grossly normal heart sounds.   Respiratory: Normal respiratory effort.  No retractions. Lungs CTAB. Gastrointestinal: Soft and nontender. No distention.  Musculoskeletal: No lower extremity tenderness nor edema. No gross deformities of extremities. Neurologic:  Normal speech and language. No gross focal neurologic deficits are appreciated.  Skin:  Skin is warm and dry. Incision under the right breast with now 4 cm of dehiscence extending from the lateral margin.  No surrounding cellulitis, purulent drainage, or active bleeding.   ____________________________________________  RADIOLOGY  None ____________________________________________   PROCEDURES  Procedure(s) performed:   Marland KitchenMarland KitchenLaceration Repair Date/Time: 11/30/2018 10:34 AM Performed by: Margette Fast, MD Authorized by: Margette Fast, MD   Consent:     Consent obtained:  Verbal   Consent given by:  Patient   Risks discussed:  Infection, need for additional repair, nerve damage, poor wound healing, poor cosmetic result and pain   Alternatives discussed:  No treatment Anesthesia (see MAR for exact dosages):    Anesthesia method:  None Laceration details:    Location:  Trunk   Trunk location:  R breast   Length (cm):  4 Repair type:    Repair type:  Simple Exploration:    Wound exploration: entire depth of wound probed and visualized     Wound extent: no fascia violation noted, no foreign bodies/material noted, no muscle damage noted and no vascular damage noted     Contaminated: no   Treatment:    Area cleansed with:  Betadine   Amount of cleaning:  Standard   Visualized foreign bodies/material removed: no   Skin repair:    Repair method:  Steri-Strips   Number of Steri-Strips:  6 Approximation:    Approximation:  Loose Post-procedure details:    Dressing:  Sterile dressing   Patient tolerance of procedure:  Tolerated well, no immediate complications     ____________________________________________   INITIAL IMPRESSION / ASSESSMENT AND PLAN /  ED COURSE  Pertinent labs & imaging results that were available during my care of the patient were reviewed by me and considered in my medical decision making (see chart for details).  Patient with right breast incision wound dehiscence.  Now extending 4 cm.  Larger than when seen yesterday.  No surrounding infection or purulence.  Patient afebrile here.  Plan to reach out to surgery regarding management.  Spoke with Dr. Kieth Brightly. Advises can re-attempt closure with steri-strips but if fails may need to heal by secondary intention. Discussed this possibility with the patient. Wound cleaned and closed loosely as above. No complications. Advised f/u with Dr. Excell Seltzer by phone on Monday.  ____________________________________________  FINAL CLINICAL IMPRESSION(S) / ED DIAGNOSES  Final  diagnoses:  Wound dehiscence    Note:  This document was prepared using Dragon voice recognition software and may include unintentional dictation errors.  Nanda Quinton, MD Emergency Medicine    Long, W[facility], MD 11/30/18 1037

## 2018-11-29 NOTE — ED Notes (Signed)
Pt is now reporting neck pain, purple colored urine and wanting to speak with the surgeon.

## 2018-11-29 NOTE — ED Triage Notes (Signed)
Pt seen yesterday d/t wound dehiscence to right breast.  Presents today d/t redness and drainage.  Site looks similar to yesterday when pt presented.  No fever/chills reported.

## 2018-11-29 NOTE — Discharge Instructions (Addendum)
If you notice a small amount of bloody or blood-tinged clear (serous) drainage, apply a clean dressing and light pressure  If the wound opens up and exposes underlying tissue, return to the ER or call Dr. Excell Seltzer  Monitor for any redness or foul-smelling drainage

## 2018-11-30 NOTE — Anesthesia Postprocedure Evaluation (Signed)
Anesthesia Post Note  Patient: Uma Jerde  Procedure(s) Performed: RIGHT BREAST LUMPECTOMY WITH RADIOACTIVE SEED AND RIGHT AXILLARY SENTINEL LYMPH NODE BIOPSY (Right Breast) INSERTION PORT-A-CATH WITH ULTRASOUND (Left Chest)     Patient location during evaluation: PACU Anesthesia Type: Regional and General Level of consciousness: awake and alert Pain management: pain level controlled Vital Signs Assessment: post-procedure vital signs reviewed and stable Respiratory status: spontaneous breathing, nonlabored ventilation, respiratory function stable and patient connected to nasal cannula oxygen Cardiovascular status: blood pressure returned to baseline and stable Postop Assessment: no apparent nausea or vomiting Anesthetic complications: no    Last Vitals:  Vitals:   11/28/18 1541 11/28/18 1542  BP:  (!) 146/76  Pulse:  80  Resp:  16  Temp:  36.7 C  SpO2: 99% 100%    Last Pain:  Vitals:   11/28/18 1541  TempSrc:   PainSc: 6                  Chelsey L Woodrum

## 2018-12-01 ENCOUNTER — Encounter (HOSPITAL_COMMUNITY): Payer: Self-pay | Admitting: General Surgery

## 2018-12-04 ENCOUNTER — Other Ambulatory Visit: Payer: Self-pay | Admitting: Sports Medicine

## 2018-12-08 ENCOUNTER — Telehealth: Payer: Self-pay | Admitting: Hematology

## 2018-12-08 NOTE — Telephone Encounter (Signed)
R/s appt per 3/28 sch message - unable to reach patient . Left message for patient with new appt date and time

## 2018-12-11 NOTE — Progress Notes (Signed)
[place]   Telephone:(336) [phone] Fax:(336) [phone]   Clinic Follow up Note   Patient Care Team: Hoyt Koch, MD as PCP - General (Internal Medicine) Mauro Kaufmann, RN as Oncology Nurse Navigator Rockwell Germany, RN as Oncology Nurse Navigator Excell Seltzer, MD as Consulting Physician (General Surgery) Truitt Merle, MD as Consulting Physician (Hematology) Gery Pray, MD as Consulting Physician (Radiation Oncology)  Date of Service:  12/12/2018  CHIEF COMPLAINT: F/u in right breast cancer   SUMMARY OF ONCOLOGIC HISTORY: Oncology History   Cancer Staging Malignant neoplasm of upper-outer quadrant of right breast in female, estrogen receptor positive ([place]) Staging form: Breast, AJCC 8th Edition - Clinical stage from 11/10/2018: Stage IA (cT1b, cN0, cM0, G2, ER+, PR+, HER2+) - Signed by Truitt Merle, MD on 11/18/2018       Malignant neoplasm of upper-outer quadrant of right breast in female, estrogen receptor positive ([place])   11/07/2018 Mammogram    Diagnostic 11/07/18 IMPRESSION: 1. 1 x 0.7 x 0.6 cm hypoechoic mass with distortion at the 9-9:30 position of the RIGHT breast 6 cm from the nipple with distortion in the UPPER-OUTER RIGHT breast, corresponding to the mammographic abnormality. One RIGHT axillary lymph node with borderline cortical thickness. Tissue sampling of the RIGHT breast mass and borderline RIGHT axillary lymph node recommended. ADDENDUM: Patient returned today for biopsy of a single borderline thickened lymph node in the RIGHT axilla. I could not reproduce a morphologically abnormal lymph node today, possibly resolved in the interval. Today, only normal appearing lymph nodes were identified in the RIGHT axilla. As such, the axillary ultrasound-guided biopsy was canceled.    11/10/2018 Cancer Staging    Staging form: Breast, AJCC 8th Edition - Clinical stage from 11/10/2018: Stage IA (cT1b, cN0, cM0, G2, ER+, PR+, HER2+) - Signed  by Truitt Merle, MD on 11/18/2018    11/11/2018 Initial Biopsy    Diagnosis 11/11/18 Breast, right, needle core biopsy, upper outer - 9:30 o'clock position - INVASIVE DUCTAL CARCINOMA, GRADE II/III. - SEE MICROSCOPIC DESCRIPTION.    11/11/2018 Receptors her2    Results: IMMUNOHISTOCHEMICAL AND MORPHOMETRIC ANALYSIS PERFORMED MANUALLY The tumor cells are POSITIVE for Her2 (3+). Estrogen Receptor: 95%, POSITIVE, STRONG STAINING INTENSITY Progesterone Receptor: 5%, POSITIVE, STRONG STAINING INTENSITY Proliferation Marker Ki67: 20%    11/17/2018 Initial Diagnosis    Malignant neoplasm of upper-outer quadrant of right breast in female, estrogen receptor positive ([place])    11/28/2018 Surgery    RIGHT BREAST LUMPECTOMY WITH RADIOACTIVE SEED AND RIGHT AXILLARY SENTINEL LYMPH NODE BIOPSY by Dr. Excell Seltzer  11/28/18     11/28/2018 Pathology Results    Diagnosis 11/28/18  1. Breast, lumpectomy, Right w/seed - INVASIVE DUCTAL CARCINOMA, 1.6 CM, [place] GRADE 2 OF 3. - MARGINS OF RESECTION ARE NOT INVOLVED (CLOSEST MARGIN: LESS THAN 1 MM, ANTERIOR). - DUCTAL CARCINOMA IN SITU. - BIOPSY SITE CHANGES. - SEE ONCOLOGY TABLE. 2. Lymph node, sentinel, biopsy, right Axillary - ONE LYMPH NODE, NEGATIVE FOR CARCINOMA (0/1). 3. Lymph node, sentinel, biopsy, right Axillary - ONE LYMPH NODE, NEGATIVE FOR CARCINOMA (0/1).    11/28/2018 Cancer Staging    Staging form: Breast, AJCC 8th Edition - Pathologic stage from 11/28/2018: Stage IA (pT1c, pN0, cM0, G2, ER+, PR+, HER2+) - Signed by Truitt Merle, MD on 12/11/2018     Chemotherapy    PENDING adjuvant chemo weekly Taxol with Herceptin q3weeks starting 01/02/19       CURRENT THERAPY:  PENDING adjuvant chemo weekly Taxol with Herceptin q3weeks starting 01/02/19  IV iron as needed  INTERVAL HISTORY:  Summer [name] is here for a follow up post right breast lumpectomy. She presents to the clinic today by herself. She notes having issues with bleeding at right  breast incision site post surgery but this has resolved the last few days. She had went to ED twice about this. She denies breast pain from this. She feels overall back at baseline.     REVIEW OF SYSTEMS:   Constitutional: Denies fevers, chills or abnormal weight loss Eyes: Denies blurriness of vision Ears, nose, mouth, throat, and face: Denies mucositis or sore throat Respiratory: Denies cough, dyspnea or wheezes Cardiovascular: Denies palpitation, chest discomfort or lower extremity swelling Gastrointestinal:  Denies nausea, heartburn or change in bowel habits Skin: Denies abnormal skin rashes Lymphatics: Denies new lymphadenopathy or easy bruising Neurological:Denies numbness, tingling or new weaknesses Behavioral/Psych: Mood is stable, no new changes  All other systems were reviewed with the patient and are negative.  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   Diverticulosis    Fibromyalgia    History of kidney stones    History of kidney stones    History of shingles 2012--  no residual pain   Left ureteral calculus    MCI (mild cognitive impairment)    Trigeminal neuralgia RIGHT   Urgency of urination     SURGICAL HISTORY: Past Surgical History:  Procedure Laterality Date   BREAST LUMPECTOMY WITH RADIOACTIVE SEED AND SENTINEL LYMPH NODE BIOPSY Right 11/28/2018   Procedure: RIGHT BREAST LUMPECTOMY WITH RADIOACTIVE SEED AND RIGHT AXILLARY SENTINEL LYMPH NODE BIOPSY;  Surgeon: Excell Seltzer, MD;  Location: [place];  Service: General;  Laterality: Right;   CEREBRAL MICROVASCULAR DECOMPRESSION  07-05-2006   RIGHT TRIGEMINAL NERVE   CYSTOSCOPY/RETROGRADE/URETEROSCOPY/STONE EXTRACTION WITH BASKET  07/04/2012   Procedure: CYSTOSCOPY/RETROGRADE/URETEROSCOPY/STONE EXTRACTION WITH BASKET;  Surgeon: Claybon Jabs, MD;  Location:  [facility];  Service: Urology;  Laterality: Left;   EXPLORATORY LAPAROTOMY/ RESECTION MID TO DISTAL SIGMOID AND PROXIMAL RECTUM/ END PROXIMAL SIGMOID COLOSTOMY  07-17-2006   PERFORATED DIVERTICULITIS WITH PERITONITIS   gamma knife  05/17/2016   for trigeminal neuralgia, [facility], Dr Salomon Fick   gamma knife  2019   PORTACATH PLACEMENT Left 11/28/2018   Procedure: INSERTION PORT-A-CATH WITH ULTRASOUND;  Surgeon: Excell Seltzer, MD;  Location: [place];  Service: General;  Laterality: Left;   RESECTION COLOSTOMY/ CLOSURE COLOSTOMY WITH COLOPROCTOSTOMY  02-20-2007   TONSILLECTOMY  AS CHILD   URETEROSCOPY  07/04/2012   Procedure: URETEROSCOPY;  Surgeon: Claybon Jabs, MD;  [facility];  Service: Urology;  Laterality: Left;   VAGINAL HYSTERECTOMY  1998   Partial   WISDOM TOOTH EXTRACTION      I have reviewed the social history and family history with the patient and they are unchanged from previous note.  ALLERGIES:  is allergic to clindamycin hcl.  MEDICATIONS:  Current Outpatient Medications  Medication Sig Dispense Refill   acetaminophen (TYLENOL) 500 MG tablet Take 1,000 mg by mouth every 6 (six) hours as needed (pain).      gabapentin (NEURONTIN) 600 MG tablet TAKE 2 TABLETS TID. MAX OF SIX TABS PER DAY. (Patient taking differently: Take 1,200 mg by mouth 3 (three) times daily. TAKE 2 TABLETS TID. MAX OF SIX TABS PER DAY.) 540 tablet 3   LamoTRIgine 200 MG  TB24 24 hour tablet Take 1 tablet (200 mg total) by mouth at bedtime. (Patient taking differently: Take 100 mg by mouth at bedtime. ) 30 tablet 11   oxyCODONE (OXY IR/ROXICODONE) 5 MG immediate release tablet Take 1 tablet (5 mg total) by mouth every 6 (six) hours as needed for moderate pain, severe pain or breakthrough pain. 10 tablet 0   No current facility-administered medications for this visit.     PHYSICAL EXAMINATION: ECOG PERFORMANCE STATUS: 1 - Symptomatic but completely  ambulatory  Vitals:   12/12/18 1028  BP: (!) 121/53  Pulse: 67  Resp: 17  Temp: 98.4 F (36.9 C)  SpO2: 100%   Filed Weights   12/12/18 1028  Weight: 191 lb (86.6 kg)    GENERAL:alert, no distress and comfortable SKIN: skin color, texture, turgor are normal, no rashes or significant lesions (+) PAC in place, clean EYES: normal, Conjunctiva are pink and non-injected, sclera clear OROPHARYNX:no exudate, no erythema and lips, buccal mucosa, and tongue normal  NECK: supple, thyroid normal size, non-tender, without nodularity LYMPH:  no palpable lymphadenopathy in the cervical, axillary or inguinal LUNGS: clear to auscultation and percussion with normal breathing effort HEART: regular rate & rhythm and no murmurs and no lower extremity edema ABDOMEN:abdomen soft, non-tender and normal bowel sounds Musculoskeletal:no cyanosis of digits and no clubbing  NEURO: alert & oriented x 3 with fluent speech, no focal motor/sensory deficits BREAST: S/p right breast lumpectomy: Surgical incisions healing very well with mild scar tissue, area clean and dry with no bleeding (+) Left nipple retraction (+) No palpable mass or adenopathy in either breasts  LABORATORY DATA:  I have reviewed the data as listed CBC Latest Ref Rng & Units 12/12/2018 11/19/2018 11/19/2018  WBC 4.0 - 10.5 K/uL 4.6 6.2 -  Hemoglobin 12.0 - 15.0 g/dL 10.3(L) 9.0(L) -  Hematocrit 36.0 - 46.0 % 35.2(L) 30.4(L) 31.2(L)  Platelets 150 - 400 K/uL 279 374 -     CMP Latest Ref Rng & Units 12/12/2018 11/19/2018 10/12/2018  Glucose 70 - 99 mg/dL 94 83 136(H)  BUN 8 - 23 mg/dL _0 Creatinine 0.44 - 1.00 mg/dL 0.82 0.82 0.96  Sodium 135 - 145 mmol/L 143 141 136  Potassium 3.5 - 5.1 mmol/L 3.9 4.1 3.1(L)  Chloride 98 - 111 mmol/L 106 106 101  CO2 22 - 32 mmol/L _1 Calcium 8.9 - 10.3 mg/dL 9.2 9.0 9.3  Total Protein 6.5 - 8.1 g/dL 7.0 7.2 -  Total Bilirubin 0.3 - 1.2 mg/dL 0.6 0.6 -  Alkaline Phos 38 - 126 U/L 69 73 -    AST 15 - 41 U/L 15 17 -  ALT 0 - 44 U/L 10 9 -      RADIOGRAPHIC STUDIES: I have personally reviewed the radiological images as listed and agreed with the findings in the report. No results found.   ASSESSMENT & PLAN:  Summer [name] is a 68 y.o. female with   1. Malignant neoplasm of upper-outer quadrant of right breast, Stage IA, cT1b,N0,M0), ER/PR+, HER2+, Grade II  She was recently diagnosed in 11/2018. She is s/p right breast lumpectomy and SLNB as of 11/28/18.  -We reviewed and discussed the pathology results which showed her small grade II tumor was completely resected with negative margins and node negative.  -due to her early stage disease, I do not think she needs staging scans  -I discussed her HER2 positive grade II disease is more aggressive increases her risk  of recurrence. I recommend adjuvant chemo to reduce her risk of recurrence before proceed ing with adjuvant radiation to reduce her risk of local recurrence and anti-estrogen therapy for 5-7 years to reduce her risk of distant recurrence.  -For chemo I recommend standard treatment of weekly Taxol with Herceptin for 12 weeks followed by maintenance herceptin every 3 weeks to complete one year therapy   --Chemotherapy consent: Side effects including but does not limited to, fatigue, nausea, vomiting, diarrhea, hair loss, neuropathy, fluid retention, renal and kidney dysfunction, neutropenic fever, needed for blood transfusion, bleeding, congestive hear failure, were discussed with patient in great detail. She agrees to proceed. -the goal of therapy is curative -Given her risk of heart failure from Herceptin, will obtain a baseline ECHO and monitor every 3 months throughout Herceptin treatment.  -Will have chemo education class before chemo starts. She already has her PAC placed.  -We also discussed the breast cancer surveillance after her surgery. She will continue annual screening mammogram, self exam, and a routine  office visit with lab and exam with [place].  -I do not plan to do full body scan unless their are concerning signs or symptoms.  -She is clinically doing well. She is healing well from surgery. Labs reviewed, CBC WNL except mild anemia with gh 10.3, CMP WNL. Ca 27.29 is still pending.  -Plan to start chemo in 3 weeks -F/u in 3 weeks    2. Anemia, iron deficiency  -Pt notes she has been anemic in the past year -Last colonoscopy was in 04/2012 which was unremarkable except diverticulosis.  -She had partial colectomy in the past due to severe diverticulosis. -This is possible iron deficiency from diverticulosis. She denies any current bleeding.  -After COVID-19 improves I will refer her back to GI for colonoscopy  -Her workup showed iron 17, ferritin 4, normal retic ct, folate Vitamin B12 and SPEP panels.  -IV iron was given 11/26/18. Today labs show Hg 10.3 (12/12/18), improved, will give second dose of IV iron today (12/12/18)   3.Trigeminal neuralgia -on meds, stable   PLAN:  -Labs reviewed and exam done, she is healing well  -Chemo class next week -Echo next week -Lab, flush, and chemo with weekly Taxol X6, herceptin every 3 weeks X2, starting in 3 weeks  -F/u in 3, 4 and 6 weeks    No problem-specific Assessment & Plan notes found for this encounter.   No orders of the defined types were placed in this encounter.  All questions were answered. The patient knows to call the clinic with any problems, questions or concerns. No barriers to learning was detected. I spent 30 minutes counseling the patient face to face. The total time spent in the appointment was 40 minutes and more than 50% was on counseling and review of test results     Truitt Merle, MD 12/12/2018   I, Joslyn Devon, am acting as scribe for Truitt Merle, MD.   I have reviewed the above documentation for accuracy and completeness, and I agree with the above.

## 2018-12-12 ENCOUNTER — Encounter: Payer: Self-pay | Admitting: Hematology

## 2018-12-12 ENCOUNTER — Ambulatory Visit: Payer: Federal, State, Local not specified - PPO | Admitting: Hematology

## 2018-12-12 ENCOUNTER — Inpatient Hospital Stay: Payer: Federal, State, Local not specified - PPO

## 2018-12-12 ENCOUNTER — Other Ambulatory Visit: Payer: Federal, State, Local not specified - PPO

## 2018-12-12 ENCOUNTER — Other Ambulatory Visit: Payer: Self-pay | Admitting: Hematology

## 2018-12-12 ENCOUNTER — Inpatient Hospital Stay: Payer: Federal, State, Local not specified - PPO | Attending: Hematology | Admitting: Hematology

## 2018-12-12 ENCOUNTER — Other Ambulatory Visit: Payer: Self-pay

## 2018-12-12 ENCOUNTER — Telehealth: Payer: Self-pay | Admitting: Hematology

## 2018-12-12 VITALS — BP 121/53 | HR 67 | Temp 98.4°F | Resp 17 | Ht 66.0 in | Wt 191.0 lb

## 2018-12-12 VITALS — BP 130/55 | HR 72 | Resp 17

## 2018-12-12 DIAGNOSIS — G500 Trigeminal neuralgia: Secondary | ICD-10-CM

## 2018-12-12 DIAGNOSIS — M797 Fibromyalgia: Secondary | ICD-10-CM | POA: Diagnosis not present

## 2018-12-12 DIAGNOSIS — Z5111 Encounter for antineoplastic chemotherapy: Secondary | ICD-10-CM | POA: Diagnosis not present

## 2018-12-12 DIAGNOSIS — Z5112 Encounter for antineoplastic immunotherapy: Secondary | ICD-10-CM | POA: Insufficient documentation

## 2018-12-12 DIAGNOSIS — M1990 Unspecified osteoarthritis, unspecified site: Secondary | ICD-10-CM | POA: Diagnosis not present

## 2018-12-12 DIAGNOSIS — Z87442 Personal history of urinary calculi: Secondary | ICD-10-CM

## 2018-12-12 DIAGNOSIS — C50411 Malignant neoplasm of upper-outer quadrant of right female breast: Secondary | ICD-10-CM | POA: Diagnosis present

## 2018-12-12 DIAGNOSIS — R51 Headache: Secondary | ICD-10-CM | POA: Insufficient documentation

## 2018-12-12 DIAGNOSIS — Z170 Estrogen receptor positive status [ER+]: Secondary | ICD-10-CM

## 2018-12-12 DIAGNOSIS — T451X5S Adverse effect of antineoplastic and immunosuppressive drugs, sequela: Secondary | ICD-10-CM | POA: Diagnosis not present

## 2018-12-12 DIAGNOSIS — G620 Drug-induced polyneuropathy: Secondary | ICD-10-CM | POA: Diagnosis not present

## 2018-12-12 DIAGNOSIS — D509 Iron deficiency anemia, unspecified: Secondary | ICD-10-CM

## 2018-12-12 DIAGNOSIS — Z79899 Other long term (current) drug therapy: Secondary | ICD-10-CM

## 2018-12-12 DIAGNOSIS — R252 Cramp and spasm: Secondary | ICD-10-CM | POA: Diagnosis not present

## 2018-12-12 LAB — CMP (CANCER CENTER ONLY)
ALT: 10 U/L (ref 0–44)
AST: 15 U/L (ref 15–41)
Albumin: 3.8 g/dL (ref 3.5–5.0)
Alkaline Phosphatase: 69 U/L (ref 38–126)
Anion gap: 9 (ref 5–15)
BUN: 8 mg/dL (ref 8–23)
CO2: 28 mmol/L (ref 22–32)
Calcium: 9.2 mg/dL (ref 8.9–10.3)
Chloride: 106 mmol/L (ref 98–111)
Creatinine: 0.82 mg/dL (ref 0.44–1.00)
GFR, Est AFR Am: >60 mL/min (ref >60)
GFR, Estimated: >60 mL/min (ref >60)
Glucose, Bld: 94 mg/dL (ref 70–99)
Potassium: 3.9 mmol/L (ref 3.5–5.1)
Sodium: 143 mmol/L (ref 135–145)
Total Bilirubin: 0.6 mg/dL (ref 0.3–1.2)
Total Protein: 7 g/dL (ref 6.5–8.1)

## 2018-12-12 LAB — CBC WITH DIFFERENTIAL (CANCER CENTER ONLY)
Abs Immature Granulocytes: 0.01 10*3/uL (ref 0.00–0.07)
Basophils Absolute: 0.1 10*3/uL (ref 0.0–0.1)
Basophils Relative: 1 %
Eosinophils Absolute: 0.1 10*3/uL (ref 0.0–0.5)
Eosinophils Relative: 2 %
HCT: 35.2 % — ABNORMAL LOW (ref 36.0–46.0)
Hemoglobin: 10.3 g/dL — ABNORMAL LOW (ref 12.0–15.0)
Immature Granulocytes: 0 %
Lymphocytes Relative: 26 %
Lymphs Abs: 1.2 10*3/uL (ref 0.7–4.0)
MCH: 25.4 pg — ABNORMAL LOW (ref 26.0–34.0)
MCHC: 29.3 g/dL — ABNORMAL LOW (ref 30.0–36.0)
MCV: 86.7 fL (ref 80.0–100.0)
Monocytes Absolute: 0.4 10*3/uL (ref 0.1–1.0)
Monocytes Relative: 8 %
Neutro Abs: 2.9 10*3/uL (ref 1.7–7.7)
Neutrophils Relative %: 63 %
Platelet Count: 279 10*3/uL (ref 150–400)
RBC: 4.06 MIL/uL (ref 3.87–5.11)
RDW: 22.5 % — ABNORMAL HIGH (ref 11.5–15.5)
WBC Count: 4.6 10*3/uL (ref 4.0–10.5)
nRBC: 0 % (ref 0.0–0.2)

## 2018-12-12 MED ORDER — SODIUM CHLORIDE 0.9 % IV SOLN
510.0000 mg | Freq: Once | INTRAVENOUS | Status: AC
  Administered 2018-12-12: 510 mg via INTRAVENOUS
  Filled 2018-12-12: qty 17

## 2018-12-12 MED ORDER — LIDOCAINE-PRILOCAINE 2.5-2.5 % EX CREA: TOPICAL_CREAM | CUTANEOUS | 3 refills | Status: DC

## 2018-12-12 MED ORDER — PROCHLORPERAZINE MALEATE 10 MG PO TABS: 10.0000 mg | ORAL_TABLET | Freq: Four times a day (QID) | ORAL | 1 refills | Status: DC | PRN

## 2018-12-12 MED ORDER — SODIUM CHLORIDE 0.9 % IV SOLN
Freq: Once | INTRAVENOUS | Status: AC
  Administered 2018-12-12: 12:00:00 via INTRAVENOUS
  Filled 2018-12-12: qty 250

## 2018-12-12 MED ORDER — ONDANSETRON HCL 8 MG PO TABS: 8.0000 mg | ORAL_TABLET | Freq: Two times a day (BID) | ORAL | 1 refills | Status: DC | PRN

## 2018-12-12 MED ORDER — HEPARIN SOD (PORK) LOCK FLUSH 100 UNIT/ML IV SOLN
500.0000 [IU] | Freq: Once | INTRAVENOUS | Status: AC | PRN
  Administered 2018-12-12: 500 [IU]
  Filled 2018-12-12: qty 5

## 2018-12-12 MED ORDER — SODIUM CHLORIDE 0.9% FLUSH
10.0000 mL | Freq: Once | INTRAVENOUS | Status: AC | PRN
  Administered 2018-12-12: 10 mL
  Filled 2018-12-12: qty 10

## 2018-12-12 NOTE — Progress Notes (Signed)
START ON PATHWAY REGIMEN - Breast   Paclitaxel Weekly + Trastuzumab Weekly:   Administer weekly:     Paclitaxel      Trastuzumab-xxxx      Trastuzumab-xxxx   **Always confirm dose/schedule in your pharmacy ordering system**  Trastuzumab (Maintenance - NO Loading Dose):   A cycle is every 21 days:     Trastuzumab-xxxx   **Always confirm dose/schedule in your pharmacy ordering system**  Patient Characteristics: Postoperative without Neoadjuvant Therapy (Pathologic Staging), Invasive Disease, Adjuvant Therapy, HER2 Positive, ER Positive, Node Negative, pT1c, pN0/N15m Therapeutic Status: Postoperative without Neoadjuvant Therapy (Pathologic Staging) AJCC Grade: G2 AJCC N Category: pN0 AJCC M Category: cM0 ER Status: Positive (+) AJCC 8 Stage Grouping: IA HER2 Status: Positive (+) Oncotype Dx Recurrence Score: Not Appropriate AJCC T Category: pT1c PR Status: Positive (+) Intent of Therapy: Curative Intent, Discussed with Patient

## 2018-12-12 NOTE — Telephone Encounter (Signed)
Scheduled appt per 4/3 los.  Treatment area will print out scheduled appts for the patient.

## 2018-12-12 NOTE — Progress Notes (Signed)
MD called pharmacy requesting change all Dex 20 mg IVPB to Dex 10 mg IV push (MD pref).  Orders updated & signed. Kennith Center, Pharm.D., CPP 12/12/2018@2 :26 PM

## 2018-12-12 NOTE — Patient Instructions (Signed)
Coronavirus (COVID-19) Are you at risk?  Are you at risk for the Coronavirus (COVID-19)?  To be considered HIGH RISK for Coronavirus (COVID-19), you have to meet the following criteria:  . Traveled to [place], [place], [place], [place] or [place]; or in the [place] to [place], [place], [place], or [place]; and have fever, cough, and shortness of breath within the last 2 weeks of travel OR . Been in close contact with a person diagnosed with COVID-19 within the last 2 weeks and have fever, cough, and shortness of breath . IF YOU DO NOT MEET THESE CRITERIA, YOU ARE CONSIDERED LOW RISK FOR COVID-19.  What to do if you are HIGH RISK for COVID-19?  . If you are having a medical emergency, call 911. . Seek medical care right away. Before you go to a doctor's office, urgent care or emergency department, call ahead and tell them about your recent travel, contact with someone diagnosed with COVID-19, and your symptoms. You should receive instructions from your physician's office regarding next steps of care.  . When you arrive at healthcare provider, tell the healthcare staff immediately you have returned from visiting [place], [place], [place], [place] or [place]; or traveled in the [place] to [place], [place], [place], or [place]; in the last two weeks or you have been in close contact with a person diagnosed with COVID-19 in the last 2 weeks.   . Tell the health care staff about your symptoms: fever, cough and shortness of breath. . After you have been seen by a medical provider, you will be either: o Tested for (COVID-19) and discharged home on quarantine except to seek medical care if symptoms worsen, and asked to  - Stay home and avoid contact with others until you get your results (4-5 days)  - Avoid travel on public transportation if possible (such as bus, train, or airplane) or o Sent to the Emergency Department by EMS for evaluation, COVID-19 testing, and possible  admission depending on your condition and test results.  What to do if you are LOW RISK for COVID-19?  Reduce your risk of any infection by using the same precautions used for avoiding the common cold or flu:  . Wash your hands often with soap and warm water for at least 20 seconds.  If soap and water are not readily available, use an alcohol-based hand sanitizer with at least 60% alcohol.  . If coughing or sneezing, cover your mouth and nose by coughing or sneezing into the elbow areas of your shirt or coat, into a tissue or into your sleeve (not your hands). . Avoid shaking hands with others and consider head nods or verbal greetings only. . Avoid touching your eyes, nose, or mouth with unwashed hands.  . Avoid close contact with people who are sick. . Avoid places or events with large numbers of people in one location, like concerts or sporting events. . Carefully consider travel plans you have or are making. . If you are planning any travel outside or inside the [place], visit the CDC's Travelers' Health webpage for the latest health notices. . If you have some symptoms but not all symptoms, continue to monitor at home and seek medical attention if your symptoms worsen. . If you are having a medical emergency, call 911.  ADDITIONAL HEALTHCARE OPTIONS FOR PATIENTS  [facility] / e-Visit: [email or web address]         [facility]: [phone]  [facility]: [phone]                   [facility]: [phone]   Ferumoxytol injection What is this medicine? FERUMOXYTOL is an iron complex. Iron is used to make healthy red blood cells, which carry oxygen and nutrients throughout the body. This medicine is used to treat iron deficiency anemia. This medicine may be used for other purposes; ask your health care provider or pharmacist if you have questions. COMMON BRAND NAME(S): Feraheme What should I  tell my health care provider before I take this medicine? They need to know if you have any of these conditions: -anemia not caused by low iron levels -high levels of iron in the blood -magnetic resonance imaging (MRI) test scheduled -an unusual or allergic reaction to iron, other medicines, foods, dyes, or preservatives -pregnant or trying to get pregnant -breast-feeding How should I use this medicine? This medicine is for injection into a vein. It is given by a health care professional in a hospital or clinic setting. Talk to your pediatrician regarding the use of this medicine in children. Special care may be needed. Overdosage: If you think you have taken too much of this medicine contact a poison control center or emergency room at once. NOTE: This medicine is only for you. Do not share this medicine with others. What if I miss a dose? It is important not to miss your dose. Call your doctor or health care professional if you are unable to keep an appointment. What may interact with this medicine? This medicine may interact with the following medications: -other iron products This list may not describe all possible interactions. Give your health care provider a list of all the medicines, herbs, non-prescription drugs, or dietary supplements you use. Also tell them if you smoke, drink alcohol, or use illegal drugs. Some items may interact with your medicine. What should I watch for while using this medicine? Visit your doctor or healthcare professional regularly. Tell your doctor or healthcare professional if your symptoms do not start to get better or if they get worse. You may need blood work done while you are taking this medicine. You may need to follow a special diet. Talk to your doctor. Foods that contain iron include: whole grains/cereals, dried fruits, beans, or peas, leafy green vegetables, and organ meats (liver, kidney). What side effects may I notice from receiving this  medicine? Side effects that you should report to your doctor or health care professional as soon as possible: -allergic reactions like skin rash, itching or hives, swelling of the face, lips, or tongue -breathing problems -changes in blood pressure -feeling faint or lightheaded, falls -fever or chills -flushing, sweating, or hot feelings -swelling of the ankles or feet Side effects that usually do not require medical attention (report to your doctor or health care professional if they continue or are bothersome): -diarrhea -headache -nausea, vomiting -stomach pain This list may not describe all possible side effects. Call your doctor for medical advice about side effects. You may report side effects to FDA at 1-800-FDA-1088. Where should I keep my medicine? This drug is given in a hospital or clinic and will not be stored at home. NOTE: This sheet is a summary. It may not cover all possible information. If you have questions about this medicine, talk to your doctor, pharmacist, or health care provider.  2019 Elsevier/Gold Standard (2016-10-15 20:21:10)  

## 2018-12-13 LAB — CANCER ANTIGEN 27.29: CA 27.29: 15 U/mL (ref 0.0–38.6)

## 2018-12-16 ENCOUNTER — Ambulatory Visit: Payer: Federal, State, Local not specified - PPO | Admitting: Sports Medicine

## 2018-12-22 ENCOUNTER — Ambulatory Visit (HOSPITAL_COMMUNITY)
Admission: RE | Admit: 2018-12-22 | Discharge: 2018-12-22 | Disposition: A | Discharge status: Home / Self Care | Payer: Federal, State, Local not specified - PPO | Source: Ambulatory Visit | Attending: Hematology | Admitting: Hematology

## 2018-12-22 ENCOUNTER — Encounter: Payer: Self-pay | Admitting: Hematology

## 2018-12-22 ENCOUNTER — Inpatient Hospital Stay: Payer: Federal, State, Local not specified - PPO

## 2018-12-22 ENCOUNTER — Other Ambulatory Visit: Payer: Self-pay

## 2018-12-22 ENCOUNTER — Telehealth: Payer: Self-pay | Admitting: *Deleted

## 2018-12-22 DIAGNOSIS — I351 Nonrheumatic aortic (valve) insufficiency: Secondary | ICD-10-CM | POA: Diagnosis not present

## 2018-12-22 DIAGNOSIS — C50411 Malignant neoplasm of upper-outer quadrant of right female breast: Secondary | ICD-10-CM | POA: Diagnosis present

## 2018-12-22 DIAGNOSIS — Z170 Estrogen receptor positive status [ER+]: Secondary | ICD-10-CM | POA: Diagnosis not present

## 2018-12-22 NOTE — Progress Notes (Signed)
**   Echocardiogram 2D Echocardiogram has been performed.  Darlina Sicilian M 12/22/2018, 10:28 AM

## 2018-12-22 NOTE — Progress Notes (Signed)
Called patient to introduce myself as Arboriculturist and to offer available resources.  Discussed one-time $1000 Radio broadcast assistant to assist with personal expenses while going through treatment.  Also discussed copay assistance through PA[employer] and qualifications.   Asked patient if she would like to apply for either or both and she states not at this time.

## 2019-01-01 ENCOUNTER — Inpatient Hospital Stay: Payer: Federal, State, Local not specified - PPO

## 2019-01-01 ENCOUNTER — Telehealth: Payer: Self-pay | Admitting: *Deleted

## 2019-01-01 ENCOUNTER — Inpatient Hospital Stay: Payer: Federal, State, Local not specified - PPO | Admitting: Hematology

## 2019-01-01 ENCOUNTER — Telehealth: Payer: Self-pay | Admitting: Hematology

## 2019-01-01 NOTE — Telephone Encounter (Signed)
Left message on pt.'s voicemail that he appointment was rescheduled to 4/24 a 730am.

## 2019-01-01 NOTE — Progress Notes (Signed)
[facility]   Telephone:(336) [phone] Fax:(336) [phone]   Clinic Follow up Note   Patient Care Team: Hoyt Koch, MD as PCP - General (Internal Medicine) Mauro Kaufmann, RN as Oncology Nurse Navigator Rockwell Germany, RN as Oncology Nurse Navigator Excell Seltzer, MD as Consulting Physician (General Surgery) Truitt Merle, MD as Consulting Physician (Hematology) Gery Pray, MD as Consulting Physician (Radiation Oncology)  Date of Service:  01/02/2019  CHIEF COMPLAINT: F/u in right breast cancer   SUMMARY OF ONCOLOGIC HISTORY: Oncology History   Cancer Staging Malignant neoplasm of upper-outer quadrant of right breast in female, estrogen receptor positive ([place]) Staging form: Breast, AJCC 8th Edition - Clinical stage from 11/10/2018: Stage IA (cT1b, cN0, cM0, G2, ER+, PR+, HER2+) - Signed by Truitt Merle, MD on 11/18/2018       Malignant neoplasm of upper-outer quadrant of right breast in female, estrogen receptor positive ([place])   11/07/2018 Mammogram    Diagnostic 11/07/18 IMPRESSION: 1. 1 x 0.7 x 0.6 cm hypoechoic mass with distortion at the 9-9:30 position of the RIGHT breast 6 cm from the nipple with distortion in the UPPER-OUTER RIGHT breast, corresponding to the mammographic abnormality. One RIGHT axillary lymph node with borderline cortical thickness. Tissue sampling of the RIGHT breast mass and borderline RIGHT axillary lymph node recommended. ADDENDUM: Patient returned today for biopsy of a single borderline thickened lymph node in the RIGHT axilla. I could not reproduce a morphologically abnormal lymph node today, possibly resolved in the interval. Today, only normal appearing lymph nodes were identified in the RIGHT axilla. As such, the axillary ultrasound-guided biopsy was canceled.    11/10/2018 Cancer Staging    Staging form: Breast, AJCC 8th Edition - Clinical stage from 11/10/2018: Stage IA (cT1b, cN0, cM0, G2, ER+, PR+, HER2+) - Signed  by Truitt Merle, MD on 11/18/2018    11/11/2018 Initial Biopsy    Diagnosis 11/11/18 Breast, right, needle core biopsy, upper outer - 9:30 o'clock position - INVASIVE DUCTAL CARCINOMA, GRADE II/III. - SEE MICROSCOPIC DESCRIPTION.    11/11/2018 Receptors her2    Results: IMMUNOHISTOCHEMICAL AND MORPHOMETRIC ANALYSIS PERFORMED MANUALLY The tumor cells are POSITIVE for Her2 (3+). Estrogen Receptor: 95%, POSITIVE, STRONG STAINING INTENSITY Progesterone Receptor: 5%, POSITIVE, STRONG STAINING INTENSITY Proliferation Marker Ki67: 20%    11/17/2018 Initial Diagnosis    Malignant neoplasm of upper-outer quadrant of right breast in female, estrogen receptor positive ([place])    11/28/2018 Surgery    RIGHT BREAST LUMPECTOMY WITH RADIOACTIVE SEED AND RIGHT AXILLARY SENTINEL LYMPH NODE BIOPSY by Dr. Excell Seltzer  11/28/18     11/28/2018 Pathology Results    Diagnosis 11/28/18  1. Breast, lumpectomy, Right w/seed - INVASIVE DUCTAL CARCINOMA, 1.6 CM, NOTTINGHAM GRADE 2 OF 3. - MARGINS OF RESECTION ARE NOT INVOLVED (CLOSEST MARGIN: LESS THAN 1 MM, ANTERIOR). - DUCTAL CARCINOMA IN SITU. - BIOPSY SITE CHANGES. - SEE ONCOLOGY TABLE. 2. Lymph node, sentinel, biopsy, right Axillary - ONE LYMPH NODE, NEGATIVE FOR CARCINOMA (0/1). 3. Lymph node, sentinel, biopsy, right Axillary - ONE LYMPH NODE, NEGATIVE FOR CARCINOMA (0/1).    11/28/2018 Cancer Staging    Staging form: Breast, AJCC 8th Edition - Pathologic stage from 11/28/2018: Stage IA (pT1c, pN0, cM0, G2, ER+, PR+, HER2+) - Signed by Truitt Merle, MD on 12/11/2018    12/22/2018 Procedure    Baseline ECHO 12/22/18  IMPRESSIONS  1. The left ventricle has normal systolic function with an ejection fraction of 60-65%. The cavity size was normal. There is mild concentric  left ventricular hypertrophy. Left ventricular diastolic parameters were normal.  2. The right ventricle has normal systolic function. The cavity was normal. There is no increase in right ventricular wall  thickness.  3. Left atrial size was moderately dilated.  4. The aortic valve is tricuspid. Aortic valve regurgitation was not assessed by color flow Doppler.     01/02/2019 -  Chemotherapy    adjuvant chemo weekly Taxol with Herceptin q3weeks starting 01/02/19       CURRENT THERAPY:  -adjuvant chemo weekly Taxol with Herceptin q3weeks starting 01/02/19 -IV iron as needed  INTERVAL HISTORY:  Summer [name] is here for a follow up. She presents to the clinic today by herself. She notes her skin around her port itches occasionally. She feels she has recovered well from surgery and her incision healed well. I reviewed her medication list.  She notes she preferred her visits and treatments later in the afternoon as to not conflict with work.     REVIEW OF SYSTEMS:   Constitutional: Denies fevers, chills or abnormal weight loss Eyes: Denies blurriness of vision Ears, nose, mouth, throat, and face: Denies mucositis or sore throat Respiratory: Denies cough, dyspnea or wheezes Cardiovascular: Denies palpitation, chest discomfort or lower extremity swelling Gastrointestinal:  Denies nausea, heartburn or change in bowel habits Skin: Denies abnormal skin rashes Lymphatics: Denies new lymphadenopathy or easy bruising Neurological:Denies numbness, tingling or new weaknesses Behavioral/Psych: Mood is stable, no new changes  All other systems were reviewed with the patient and are negative.  MEDICAL HISTORY:  Past Medical History:  Diagnosis Date  . Arthritis shoulders and back  . 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 Diverticulosis   . Fibromyalgia   . History of kidney stones   . History of kidney stones   . History of shingles 2012--  no residual pain  . Left ureteral calculus   . MCI (mild cognitive impairment)   . Trigeminal neuralgia RIGHT  . Urgency of urination     SURGICAL HISTORY: Past Surgical History:   Procedure Laterality Date  . BREAST LUMPECTOMY WITH RADIOACTIVE SEED AND SENTINEL LYMPH NODE BIOPSY Right 11/28/2018   Procedure: RIGHT BREAST LUMPECTOMY WITH RADIOACTIVE SEED AND RIGHT AXILLARY SENTINEL LYMPH NODE BIOPSY;  Surgeon: Excell Seltzer, MD;  Location: [facility];  Service: General;  Laterality: Right;  . CEREBRAL MICROVASCULAR DECOMPRESSION  07-05-2006   RIGHT TRIGEMINAL NERVE  . CYSTOSCOPY/RETROGRADE/URETEROSCOPY/STONE EXTRACTION WITH BASKET  07/04/2012   Procedure: CYSTOSCOPY/RETROGRADE/URETEROSCOPY/STONE EXTRACTION WITH BASKET;  Surgeon: Claybon Jabs, MD;  Location: [facility];  Service: Urology;  Laterality: Left;  . EXPLORATORY LAPAROTOMY/ RESECTION MID TO DISTAL SIGMOID AND PROXIMAL RECTUM/ END PROXIMAL SIGMOID COLOSTOMY  07-17-2006   PERFORATED DIVERTICULITIS WITH PERITONITIS  . gamma knife  05/17/2016   for trigeminal neuralgia, [facility], Dr Salomon Fick  . gamma knife  2019  . PORTACATH PLACEMENT Left 11/28/2018   Procedure: INSERTION PORT-A-CATH WITH ULTRASOUND;  Surgeon: Excell Seltzer, MD;  Location: [facility];  Service: General;  Laterality: Left;  . RESECTION COLOSTOMY/ CLOSURE COLOSTOMY WITH COLOPROCTOSTOMY  02-20-2007  . TONSILLECTOMY  AS CHILD  . URETEROSCOPY  07/04/2012   Procedure: URETEROSCOPY;  Surgeon: Claybon Jabs, MD;  Location: [facility];  Service: Urology;  Laterality: Left;  Marland Kitchen VAGINAL HYSTERECTOMY  1998   Partial  . WISDOM TOOTH EXTRACTION      I have reviewed the social history and family history with the patient  and they are unchanged from previous note.  ALLERGIES:  is allergic to clindamycin hcl.  MEDICATIONS:  Current Outpatient Medications  Medication Sig Dispense Refill  . acetaminophen (TYLENOL) 500 MG tablet Take 1,000 mg by mouth every 6 (six) hours as needed (pain).     Marland Kitchen gabapentin (NEURONTIN) 600 MG tablet TAKE 2 TABLETS TID. MAX OF SIX TABS PER DAY. (Patient taking differently: Take 1,200 mg by mouth 3  (three) times daily. TAKE 2 TABLETS TID. MAX OF SIX TABS PER DAY.) 540 tablet 3  . LamoTRIgine 200 MG TB24 24 hour tablet Take 1 tablet (200 mg total) by mouth at bedtime. (Patient taking differently: Take 100 mg by mouth at bedtime. ) 30 tablet 11  . lidocaine-prilocaine (EMLA) cream Apply to affected area once 30 g 3  . ondansetron (ZOFRAN) 8 MG tablet Take 1 tablet (8 mg total) by mouth 2 (two) times daily as needed (Nausea or vomiting). 30 tablet 1  . oxyCODONE (OXY IR/ROXICODONE) 5 MG immediate release tablet Take 1 tablet (5 mg total) by mouth every 6 (six) hours as needed for moderate pain, severe pain or breakthrough pain. 10 tablet 0  . prochlorperazine (COMPAZINE) 10 MG tablet Take 1 tablet (10 mg total) by mouth every 6 (six) hours as needed (Nausea or vomiting). 30 tablet 1   No current facility-administered medications for this visit.    Facility-Administered Medications Ordered in Other Visits  Medication Dose Route Frequency Provider Last Rate Last Dose  . heparin lock flush 100 unit/mL  500 Units Intracatheter Once PRN Truitt Merle, MD      . PACLitaxel (TAXOL) 162 mg in sodium chloride 0.9 % 250 mL chemo infusion (</= 64m/m2)  80 mg/m2 (Treatment Plan Recorded) Intravenous Once FTruitt Merle MD      . sodium chloride flush (NS) 0.9 % injection 10 mL  10 mL Intracatheter PRN FTruitt Merle MD      . trastuzumab (HERCEPTIN) 336 mg in sodium chloride 0.9 % 250 mL chemo infusion  4 mg/kg (Treatment Plan Recorded) Intravenous Once FTruitt Merle MD 177.3 mL/hr at 01/02/19 1038 336 mg at 01/02/19 1038    PHYSICAL EXAMINATION: ECOG PERFORMANCE STATUS: 0 - Asymptomatic  Vitals:   01/02/19 0826  BP: 133/69  Pulse: 71  Resp: 18  Temp: 98.8 F (37.1 C)  SpO2: 100%   Filed Weights   01/02/19 0826  Weight: 187 lb 1.6 oz (84.9 kg)    GENERAL:alert, no distress and comfortable SKIN: skin color, texture, turgor are normal, no rashes or significant lesions EYES: normal, Conjunctiva are pink  and non-injected, sclera clear OROPHARYNX:no exudate, no erythema and lips, buccal mucosa, and tongue normal  NECK: supple, thyroid normal size, non-tender, without nodularity LYMPH:  no palpable lymphadenopathy in the cervical, axillary or inguinal LUNGS: clear to auscultation and percussion with normal breathing effort HEART: regular rate & rhythm and no murmurs and no lower extremity edema ABDOMEN:abdomen soft, non-tender and normal bowel sounds Musculoskeletal:no cyanosis of digits and no clubbing  NEURO: alert & oriented x 3 with fluent speech, no focal motor/sensory deficits BREAST: S/p right breast lumpectomy: surgical incision healed well   LABORATORY DATA:  I have reviewed the data as listed CBC Latest Ref Rng & Units 01/02/2019 12/12/2018 11/19/2018  WBC 4.0 - 10.5 K/uL 4.5 4.6 6.2  Hemoglobin 12.0 - 15.0 g/dL 11.2(L) 10.3(L) 9.0(L)  Hematocrit 36.0 - 46.0 % 37.3 35.2(L) 30.4(L)  Platelets 150 - 400 K/uL 214 279 374     CMP Latest  Ref Rng & Units 01/02/2019 12/12/2018 11/19/2018  Glucose 70 - 99 mg/dL 89 94 83  BUN 8 - 23 mg/dL 11 8 16   Creatinine 0.44 - 1.00 mg/dL 0.73 0.82 0.82  Sodium 135 - 145 mmol/L 143 143 141  Potassium 3.5 - 5.1 mmol/L 3.8 3.9 4.1  Chloride 98 - 111 mmol/L 106 106 106  CO2 22 - 32 mmol/L 28 28 24   Calcium 8.9 - 10.3 mg/dL 8.6(L) 9.2 9.0  Total Protein 6.5 - 8.1 g/dL 6.4(L) 7.0 7.2  Total Bilirubin 0.3 - 1.2 mg/dL 0.4 0.6 0.6  Alkaline Phos 38 - 126 U/L 69 69 73  AST 15 - 41 U/L 15 15 17   ALT 0 - 44 U/L 8 10 9       RADIOGRAPHIC STUDIES: I have personally reviewed the radiological images as listed and agreed with the findings in the report. No results found.   ASSESSMENT & PLAN:  Summer [name] is a 68 y.o. female with   1.Malignant neoplasm of upper-outer quadrant of right breast, StageIA, cT1b,N0,M0), ER/PR+, HER2+, GradeII  -She was recently diagnosed in 11/2018. She is s/p right breast lumpectomy and SLNB as of 11/28/18.  -We  reviewed and discussed the pathology results which showed her small grade II tumor was completely resected with negative margins and node negative.  -due to her early stage disease, I do not think she needs staging scans  -I discussed her HER2 positive grade II disease is more aggressive increases her risk of recurrence. I recommend adjuvant chemo to reduce her risk of recurrence before proceed ing with adjuvant radiation to reduce her risk of local recurrence and anti-estrogen therapy for 5-7 years to reduce her risk of distant recurrence.  -For chemo I recommend standard treatment of weekly Taxol with Herceptin for 12 weeks followed by maintenance Herceptin every 3 weeks to complete one year therapy starting today  -Labs reviewed, CBC WNL except hg 11.2, CMP WNL. Overall adequate to proceed with Taxol and Herceptin today.  Potential side effects reviewed with patient, she voiced good understanding, and agrees to proceed  -I again reviewed side effects of this regimen. I suggest she start ice bags during her infusions to reduce the risk of neuropathy. I will make a referral to cardiologist to monitor her heart function with ECHO every 3-4 months, baseline ECHO overall normal except mild left ventricular hypertrophy. She is agreeable.  -F/u in 1 week    2. Anemia, iron deficiency  -Pt notes she has been anemic in the past year -Last colonoscopy was in 04/2012 which wasunremarkable except diverticulosis.  -She had partial colectomy in the past due to severe diverticulosis. -This is possibly iron deficiency from diverticulosis. She denies any current bleeding.  -After COVID-19 improves and her chemo treatment I will refer her back to Dr. Carlean Purl for colonoscopy  -Her workup showed iron 17, ferritin 4, normal retic ct, folate Vitamin B12 and SPEP panels.  -IV iron was given 11/26/18 and 12/12/18.  -Today labs show Hg improved to 11.2 (01/02/19).  -will monitor her iron level every month     3.Trigeminal neuralgia -on meds, stable   PLAN: -Labs reviewed and adequate to proceed with Taxol and Herceptin today and continue weekly  -Send Cardiology referral, and GI referral  -Lab, flush, f/u, taxol in 1 week  No problem-specific Assessment & Plan notes found for this encounter.   No orders of the defined types were placed in this encounter.  All questions were answered. The patient knows  to call the clinic with any problems, questions or concerns. No barriers to learning was detected. I spent 20 minutes counseling the patient face to face. The total time spent in the appointment was 25 minutes and more than 50% was on counseling and review of test results     Truitt Merle, MD 01/02/2019   I, Joslyn Devon, am acting as scribe for Truitt Merle, MD.   I have reviewed the above documentation for accuracy and completeness, and I agree with the above.

## 2019-01-01 NOTE — Telephone Encounter (Signed)
Called patient per 4/23 sch message - no answer - left message for patient to call back

## 2019-01-01 NOTE — Telephone Encounter (Signed)
Left message for patient to make sure she had navigation contact numbers and wish her well with her 1st chemo today.

## 2019-01-02 ENCOUNTER — Telehealth: Payer: Self-pay | Admitting: Hematology

## 2019-01-02 ENCOUNTER — Other Ambulatory Visit: Payer: Self-pay

## 2019-01-02 ENCOUNTER — Inpatient Hospital Stay: Payer: Federal, State, Local not specified - PPO

## 2019-01-02 ENCOUNTER — Encounter: Payer: Self-pay | Admitting: Hematology

## 2019-01-02 ENCOUNTER — Inpatient Hospital Stay (HOSPITAL_BASED_OUTPATIENT_CLINIC_OR_DEPARTMENT_OTHER): Payer: Federal, State, Local not specified - PPO | Admitting: Hematology

## 2019-01-02 VITALS — BP 132/66 | HR 85 | Temp 98.4°F | Resp 18

## 2019-01-02 VITALS — BP 133/69 | HR 71 | Temp 98.8°F | Resp 18 | Ht 66.0 in | Wt 187.1 lb

## 2019-01-02 DIAGNOSIS — D509 Iron deficiency anemia, unspecified: Secondary | ICD-10-CM

## 2019-01-02 DIAGNOSIS — Z170 Estrogen receptor positive status [ER+]: Secondary | ICD-10-CM

## 2019-01-02 DIAGNOSIS — Z87442 Personal history of urinary calculi: Secondary | ICD-10-CM

## 2019-01-02 DIAGNOSIS — C50411 Malignant neoplasm of upper-outer quadrant of right female breast: Secondary | ICD-10-CM

## 2019-01-02 DIAGNOSIS — Z79899 Other long term (current) drug therapy: Secondary | ICD-10-CM

## 2019-01-02 DIAGNOSIS — M1990 Unspecified osteoarthritis, unspecified site: Secondary | ICD-10-CM

## 2019-01-02 DIAGNOSIS — G500 Trigeminal neuralgia: Secondary | ICD-10-CM | POA: Diagnosis not present

## 2019-01-02 DIAGNOSIS — M797 Fibromyalgia: Secondary | ICD-10-CM

## 2019-01-02 LAB — CMP (CANCER CENTER ONLY)
ALT: 8 U/L (ref 0–44)
AST: 15 U/L (ref 15–41)
Albumin: 3.4 g/dL — ABNORMAL LOW (ref 3.5–5.0)
Alkaline Phosphatase: 69 U/L (ref 38–126)
Anion gap: 9 (ref 5–15)
BUN: 11 mg/dL (ref 8–23)
CO2: 28 mmol/L (ref 22–32)
Calcium: 8.6 mg/dL — ABNORMAL LOW (ref 8.9–10.3)
Chloride: 106 mmol/L (ref 98–111)
Creatinine: 0.73 mg/dL (ref 0.44–1.00)
GFR, Est AFR Am: >60 mL/min (ref >60)
GFR, Estimated: >60 mL/min (ref >60)
Glucose, Bld: 89 mg/dL (ref 70–99)
Potassium: 3.8 mmol/L (ref 3.5–5.1)
Sodium: 143 mmol/L (ref 135–145)
Total Bilirubin: 0.4 mg/dL (ref 0.3–1.2)
Total Protein: 6.4 g/dL — ABNORMAL LOW (ref 6.5–8.1)

## 2019-01-02 LAB — CBC WITH DIFFERENTIAL (CANCER CENTER ONLY)
Abs Immature Granulocytes: 0.01 10*3/uL (ref 0.00–0.07)
Basophils Absolute: 0.1 10*3/uL (ref 0.0–0.1)
Basophils Relative: 1 %
Eosinophils Absolute: 0.1 10*3/uL (ref 0.0–0.5)
Eosinophils Relative: 3 %
HCT: 37.3 % (ref 36.0–46.0)
Hemoglobin: 11.2 g/dL — ABNORMAL LOW (ref 12.0–15.0)
Immature Granulocytes: 0 %
Lymphocytes Relative: 33 %
Lymphs Abs: 1.5 10*3/uL (ref 0.7–4.0)
MCH: 26.5 pg (ref 26.0–34.0)
MCHC: 30 g/dL (ref 30.0–36.0)
MCV: 88.4 fL (ref 80.0–100.0)
Monocytes Absolute: 0.4 10*3/uL (ref 0.1–1.0)
Monocytes Relative: 9 %
Neutro Abs: 2.4 10*3/uL (ref 1.7–7.7)
Neutrophils Relative %: 54 %
Platelet Count: 214 10*3/uL (ref 150–400)
RBC: 4.22 MIL/uL (ref 3.87–5.11)
RDW: 21.5 % — ABNORMAL HIGH (ref 11.5–15.5)
WBC Count: 4.5 10*3/uL (ref 4.0–10.5)
nRBC: 0 % (ref 0.0–0.2)

## 2019-01-02 MED ORDER — ACETAMINOPHEN 325 MG PO TABS
ORAL_TABLET | ORAL | Status: AC
  Filled 2019-01-02: qty 2

## 2019-01-02 MED ORDER — SODIUM CHLORIDE 0.9 % IV SOLN
80.0000 mg/m2 | Freq: Once | INTRAVENOUS | Status: AC
  Administered 2019-01-02: 162 mg via INTRAVENOUS
  Filled 2019-01-02: qty 27

## 2019-01-02 MED ORDER — ACETAMINOPHEN 325 MG PO TABS
650.0000 mg | ORAL_TABLET | Freq: Once | ORAL | Status: AC
  Administered 2019-01-02: 650 mg via ORAL

## 2019-01-02 MED ORDER — DIPHENHYDRAMINE HCL 50 MG/ML IJ SOLN
50.0000 mg | Freq: Once | INTRAMUSCULAR | Status: AC
  Administered 2019-01-02: 50 mg via INTRAVENOUS

## 2019-01-02 MED ORDER — HEPARIN SOD (PORK) LOCK FLUSH 100 UNIT/ML IV SOLN
500.0000 [IU] | Freq: Once | INTRAVENOUS | Status: AC | PRN
  Administered 2019-01-02: 500 [IU]
  Filled 2019-01-02: qty 5

## 2019-01-02 MED ORDER — SODIUM CHLORIDE 0.9% FLUSH
10.0000 mL | INTRAVENOUS | Status: DC | PRN
  Administered 2019-01-02: 10 mL
  Filled 2019-01-02: qty 10

## 2019-01-02 MED ORDER — TRASTUZUMAB CHEMO 150 MG IV SOLR
4.0000 mg/kg | Freq: Once | INTRAVENOUS | Status: AC
  Administered 2019-01-02: 336 mg via INTRAVENOUS
  Filled 2019-01-02: qty 16

## 2019-01-02 MED ORDER — DEXAMETHASONE SODIUM PHOSPHATE 10 MG/ML IJ SOLN
INTRAMUSCULAR | Status: AC
  Filled 2019-01-02: qty 1

## 2019-01-02 MED ORDER — DIPHENHYDRAMINE HCL 50 MG/ML IJ SOLN
INTRAMUSCULAR | Status: AC
  Filled 2019-01-02: qty 1

## 2019-01-02 MED ORDER — FAMOTIDINE IN NACL 20-0.9 MG/50ML-% IV SOLN
INTRAVENOUS | Status: AC
  Filled 2019-01-02: qty 50

## 2019-01-02 MED ORDER — SODIUM CHLORIDE 0.9% FLUSH
10.0000 mL | Freq: Once | INTRAVENOUS | Status: AC | PRN
  Administered 2019-01-02: 10 mL
  Filled 2019-01-02: qty 10

## 2019-01-02 MED ORDER — FAMOTIDINE IN NACL 20-0.9 MG/50ML-% IV SOLN
20.0000 mg | Freq: Once | INTRAVENOUS | Status: AC
  Administered 2019-01-02: 10:00:00 20 mg via INTRAVENOUS

## 2019-01-02 MED ORDER — DEXAMETHASONE SODIUM PHOSPHATE 10 MG/ML IJ SOLN
10.0000 mg | Freq: Once | INTRAMUSCULAR | Status: AC
  Administered 2019-01-02: 10 mg via INTRAVENOUS

## 2019-01-02 MED ORDER — SODIUM CHLORIDE 0.9% FLUSH
3.0000 mL | Freq: Once | INTRAVENOUS | Status: DC | PRN
  Filled 2019-01-02: qty 10

## 2019-01-02 MED ORDER — SODIUM CHLORIDE 0.9 % IV SOLN
Freq: Once | INTRAVENOUS | Status: AC
  Administered 2019-01-02: 09:00:00 via INTRAVENOUS
  Filled 2019-01-02: qty 250

## 2019-01-02 NOTE — Patient Instructions (Signed)
Trastuzumab injection for infusion What is this medicine? TRASTUZUMAB (tras TOO zoo mab) is a monoclonal antibody. It is used to treat breast cancer and stomach cancer. This medicine may be used for other purposes; ask your health care provider or pharmacist if you have questions. COMMON BRAND NAME(S): Herceptin, KANJINTI, OGIVRI What should I tell my health care provider before I take this medicine? They need to know if you have any of these conditions: -heart disease -heart failure -lung or breathing disease, like asthma -an unusual or allergic reaction to trastuzumab, benzyl alcohol, or other medications, foods, dyes, or preservatives -pregnant or trying to get pregnant -breast-feeding How should I use this medicine? This drug is given as an infusion into a vein. It is administered in a hospital or clinic by a specially trained health care professional. Talk to your pediatrician regarding the use of this medicine in children. This medicine is not approved for use in children. Overdosage: If you think you have taken too much of this medicine contact a poison control center or emergency room at once. NOTE: This medicine is only for you. Do not share this medicine with others. What if I miss a dose? It is important not to miss a dose. Call your doctor or health care professional if you are unable to keep an appointment. What may interact with this medicine? This medicine may interact with the following medications: -certain types of chemotherapy, such as daunorubicin, doxorubicin, epirubicin, and idarubicin This list may not describe all possible interactions. Give your health care provider a list of all the medicines, herbs, non-prescription drugs, or dietary supplements you use. Also tell them if you smoke, drink alcohol, or use illegal drugs. Some items may interact with your medicine. What should I watch for while using this medicine? Visit your doctor for checks on your progress. Report  any side effects. Continue your course of treatment even though you feel ill unless your doctor tells you to stop. Call your doctor or health care professional for advice if you get a fever, chills or sore throat, or other symptoms of a cold or flu. Do not treat yourself. Try to avoid being around people who are sick. You may experience fever, chills and shaking during your first infusion. These effects are usually mild and can be treated with other medicines. Report any side effects during the infusion to your health care professional. Fever and chills usually do not happen with later infusions. Do not become pregnant while taking this medicine or for 7 months after stopping it. Women should inform their doctor if they wish to become pregnant or think they might be pregnant. Women of child-bearing potential will need to have a negative pregnancy test before starting this medicine. There is a potential for serious side effects to an unborn child. Talk to your health care professional or pharmacist for more information. Do not breast-feed an infant while taking this medicine or for 7 months after stopping it. Women must use effective birth control with this medicine. What side effects may I notice from receiving this medicine? Side effects that you should report to your doctor or health care professional as soon as possible: -allergic reactions like skin rash, itching or hives, swelling of the face, lips, or tongue -chest pain or palpitations -cough -dizziness -feeling faint or lightheaded, falls -fever -general ill feeling or flu-like symptoms -signs of worsening heart failure like breathing problems; swelling in your legs and feet -unusually weak or tired Side effects that usually do not   require medical attention (report to your doctor or health care professional if they continue or are bothersome): -bone pain -changes in taste -diarrhea -joint pain -nausea/vomiting -weight loss This list may  not describe all possible side effects. Call your doctor for medical advice about side effects. You may report side effects to FDA at 1-800-FDA-1088. Where should I keep my medicine? This drug is given in a hospital or clinic and will not be stored at home. NOTE: This sheet is a summary. It may not cover all possible information. If you have questions about this medicine, talk to your doctor, pharmacist, or health care provider.  2019 Elsevier/Gold Standard (2016-08-21 14:37:52)     Paclitaxel injection What is this medicine? PACLITAXEL (PAK li TAX el) is a chemotherapy drug. It targets fast dividing cells, like cancer cells, and causes these cells to die. This medicine is used to treat ovarian cancer, breast cancer, lung cancer, Kaposi's sarcoma, and other cancers. This medicine may be used for other purposes; ask your health care provider or pharmacist if you have questions. COMMON BRAND NAME(S): Onxol, Taxol What should I tell my health care provider before I take this medicine? They need to know if you have any of these conditions: -history of irregular heartbeat -liver disease -low blood counts, like low white cell, platelet, or red cell counts -lung or breathing disease, like asthma -tingling of the fingers or toes, or other nerve disorder -an unusual or allergic reaction to paclitaxel, alcohol, polyoxyethylated castor oil, other chemotherapy, other medicines, foods, dyes, or preservatives -pregnant or trying to get pregnant -breast-feeding How should I use this medicine? This drug is given as an infusion into a vein. It is administered in a hospital or clinic by a specially trained health care professional. Talk to your pediatrician regarding the use of this medicine in children. Special care may be needed. Overdosage: If you think you have taken too much of this medicine contact a poison control center or emergency room at once. NOTE: This medicine is only for you. Do not share  this medicine with others. What if I miss a dose? It is important not to miss your dose. Call your doctor or health care professional if you are unable to keep an appointment. What may interact with this medicine? Do not take this medicine with any of the following medications: -disulfiram -metronidazole This medicine may also interact with the following medications: -antiviral medicines for hepatitis, HIV or AIDS -certain antibiotics like erythromycin and clarithromycin -certain medicines for fungal infections like ketoconazole and itraconazole -certain medicines for seizures like carbamazepine, phenobarbital, phenytoin -gemfibrozil -nefazodone -rifampin -St. John's wort This list may not describe all possible interactions. Give your health care provider a list of all the medicines, herbs, non-prescription drugs, or dietary supplements you use. Also tell them if you smoke, drink alcohol, or use illegal drugs. Some items may interact with your medicine. What should I watch for while using this medicine? Your condition will be monitored carefully while you are receiving this medicine. You will need important blood work done while you are taking this medicine. This medicine can cause serious allergic reactions. To reduce your risk you will need to take other medicine(s) before treatment with this medicine. If you experience allergic reactions like skin rash, itching or hives, swelling of the face, lips, or tongue, tell your doctor or health care professional right away. In some cases, you may be given additional medicines to help with side effects. Follow all directions for their use. This  drug may make you feel generally unwell. This is not uncommon, as chemotherapy can affect healthy cells as well as cancer cells. Report any side effects. Continue your course of treatment even though you feel ill unless your doctor tells you to stop. Call your doctor or health care professional for advice if  you get a fever, chills or sore throat, or other symptoms of a cold or flu. Do not treat yourself. This drug decreases your body's ability to fight infections. Try to avoid being around people who are sick. This medicine may increase your risk to bruise or bleed. Call your doctor or health care professional if you notice any unusual bleeding. Be careful brushing and flossing your teeth or using a toothpick because you may get an infection or bleed more easily. If you have any dental work done, tell your dentist you are receiving this medicine. Avoid taking products that contain aspirin, acetaminophen, ibuprofen, naproxen, or ketoprofen unless instructed by your doctor. These medicines may hide a fever. Do not become pregnant while taking this medicine. Women should inform their doctor if they wish to become pregnant or think they might be pregnant. There is a potential for serious side effects to an unborn child. Talk to your health care professional or pharmacist for more information. Do not breast-feed an infant while taking this medicine. Men are advised not to father a child while receiving this medicine. This product may contain alcohol. Ask your pharmacist or healthcare provider if this medicine contains alcohol. Be sure to tell all healthcare providers you are taking this medicine. Certain medicines, like metronidazole and disulfiram, can cause an unpleasant reaction when taken with alcohol. The reaction includes flushing, headache, nausea, vomiting, sweating, and increased thirst. The reaction can last from 30 minutes to several hours. What side effects may I notice from receiving this medicine? Side effects that you should report to your doctor or health care professional as soon as possible: -allergic reactions like skin rash, itching or hives, swelling of the face, lips, or tongue -breathing problems -changes in vision -fast, irregular heartbeat -high or low blood pressure -mouth  sores -pain, tingling, numbness in the hands or feet -signs of decreased platelets or bleeding - bruising, pinpoint red spots on the skin, black, tarry stools, blood in the urine -signs of decreased red blood cells - unusually weak or tired, feeling faint or lightheaded, falls -signs of infection - fever or chills, cough, sore throat, pain or difficulty passing urine -signs and symptoms of liver injury like dark yellow or brown urine; general ill feeling or flu-like symptoms; light-colored stools; loss of appetite; nausea; right upper belly pain; unusually weak or tired; yellowing of the eyes or skin -swelling of the ankles, feet, hands -unusually slow heartbeat Side effects that usually do not require medical attention (report to your doctor or health care professional if they continue or are bothersome): -diarrhea -hair loss -loss of appetite -muscle or joint pain -nausea, vomiting -pain, redness, or irritation at site where injected -tiredness This list may not describe all possible side effects. Call your doctor for medical advice about side effects. You may report side effects to FDA at 1-800-FDA-1088. Where should I keep my medicine? This drug is given in a hospital or clinic and will not be stored at home. NOTE: This sheet is a summary. It may not cover all possible information. If you have questions about this medicine, talk to your doctor, pharmacist, or health care provider.  2019 Elsevier/Gold Standard (2017-04-30 13:14:55)

## 2019-01-02 NOTE — Patient Instructions (Signed)

## 2019-01-02 NOTE — Telephone Encounter (Signed)
No los per 4/24.

## 2019-01-05 ENCOUNTER — Telehealth: Payer: Self-pay | Admitting: *Deleted

## 2019-01-05 NOTE — Telephone Encounter (Signed)
Chemotherapy F/U performed with "Sherri Rad [phone]) call in reporting pain and other symptoms.  Muscle and joint pain to hips, left knee, feet, toes, headache and my back was hurting yesterday.    Did not sleep well the last two nights, is there something to help me sleep?     I did well Friday.  Able to eat and drink well with no n/v.    I may have peripheral neuropathy.  Balls of feet, toes, entire feet feel sore.  Can't say they're numb or tingling but feel different and funny to walk on.  No one put ice to my feet during treatment.  No changes with hands     Diarrhea started Saturday; not runny but real soft.  Moving hourly at one point, slowing down.  Bladder functioning well.    Headache last night and this morning.  Tylenol used provided relief.  Didn't use the Oxy IR 5mg  I still have on hand."    Instructed to avoid excessive walking, hot water for baths or house work.  Drink 64 oz minimum daily or at least the day before, of and after treatment.   Denies questions or needs at this time.  Encouraged to call [phone] Mon -Fri 8:00 am - 4:30 pm or anytime as needed for symptoms, changes or event outside office hours.

## 2019-01-05 NOTE — Telephone Encounter (Signed)
-----   Message from Manuella Ghazi, RN sent at 01/02/2019  2:46 PM EDT ----- Regarding: [facility]- Patient followup Please call for patient follow up. Received first time trastuzumab and paclitaxel today (01/02/19)

## 2019-01-06 ENCOUNTER — Telehealth: Payer: Self-pay | Admitting: Medical

## 2019-01-06 ENCOUNTER — Telehealth: Payer: Self-pay

## 2019-01-06 ENCOUNTER — Telehealth: Payer: Self-pay | Admitting: *Deleted

## 2019-01-06 ENCOUNTER — Encounter: Payer: Federal, State, Local not specified - PPO | Admitting: Medical

## 2019-01-06 NOTE — Telephone Encounter (Signed)
Pt called with c/o bilateral foot pain with left foot worse with some swelling at night, H/A, joint pain, abd pain on either side of stomach, no n/v and  sternal tightness, no SOB or pain. Discussed with pt that ondansetron can cause H/A, pt informed she has not taken any anti-nausea medication.  Called Lucianne Lei and discussed pt symptoms. Informed pt she will be called with an appt discuss symptoms with Lucianne Lei. Received verbal understanding. Denies further needs at this time.

## 2019-01-06 NOTE — Telephone Encounter (Signed)
Scheduled appt per 4/28 sch message - unable to reach patient . Left message with appt date and time

## 2019-01-06 NOTE — Telephone Encounter (Signed)
EGD/Colon recall put into the system for July 2020 per Dr Carlean Purl.

## 2019-01-06 NOTE — Telephone Encounter (Signed)
-----   Message from Gatha Mayer, MD sent at 01/06/2019 11:58 AM EDT ----- Krista Blue,  I will place her on my recall list for July to get her back in and when she completes chemo if you think to send me another ask that will help.  Glendell Docker ----- Message ----- From: Truitt Merle, MD Sent: 01/02/2019  10:46 AM EDT To: Gatha Mayer, MD  Hi Glendell Docker,  You did her last colonoscopy in 2013. She has diverticulosis and had partial colectomy. She started adjuvant chemo for breast cancer today, plan for 3 months. She has been having iron deficient anemia for the past year, I recently gave her iv iron, improved.  Could you see her back in 3 months, to consider repeating colonoscopy after she completes chemo?  Thanks  Krista Blue

## 2019-01-07 ENCOUNTER — Inpatient Hospital Stay (HOSPITAL_BASED_OUTPATIENT_CLINIC_OR_DEPARTMENT_OTHER): Payer: Federal, State, Local not specified - PPO | Admitting: Medical

## 2019-01-07 ENCOUNTER — Other Ambulatory Visit: Payer: Self-pay

## 2019-01-07 VITALS — BP 133/61 | HR 73 | Temp 98.5°F | Resp 18 | Ht 66.0 in | Wt 184.3 lb

## 2019-01-07 DIAGNOSIS — M797 Fibromyalgia: Secondary | ICD-10-CM

## 2019-01-07 DIAGNOSIS — R51 Headache: Secondary | ICD-10-CM

## 2019-01-07 DIAGNOSIS — R252 Cramp and spasm: Secondary | ICD-10-CM

## 2019-01-07 DIAGNOSIS — T451X5A Adverse effect of antineoplastic and immunosuppressive drugs, initial encounter: Secondary | ICD-10-CM

## 2019-01-07 DIAGNOSIS — G500 Trigeminal neuralgia: Secondary | ICD-10-CM | POA: Diagnosis not present

## 2019-01-07 DIAGNOSIS — Z79899 Other long term (current) drug therapy: Secondary | ICD-10-CM

## 2019-01-07 DIAGNOSIS — M791 Myalgia, unspecified site: Secondary | ICD-10-CM

## 2019-01-07 DIAGNOSIS — Z170 Estrogen receptor positive status [ER+]: Secondary | ICD-10-CM

## 2019-01-07 DIAGNOSIS — C50411 Malignant neoplasm of upper-outer quadrant of right female breast: Secondary | ICD-10-CM | POA: Diagnosis not present

## 2019-01-07 DIAGNOSIS — D509 Iron deficiency anemia, unspecified: Secondary | ICD-10-CM | POA: Diagnosis not present

## 2019-01-07 DIAGNOSIS — M2550 Pain in unspecified joint: Secondary | ICD-10-CM

## 2019-01-07 DIAGNOSIS — M62838 Other muscle spasm: Secondary | ICD-10-CM

## 2019-01-07 DIAGNOSIS — G620 Drug-induced polyneuropathy: Secondary | ICD-10-CM

## 2019-01-07 DIAGNOSIS — R519 Headache, unspecified: Secondary | ICD-10-CM

## 2019-01-07 DIAGNOSIS — T451X5S Adverse effect of antineoplastic and immunosuppressive drugs, sequela: Secondary | ICD-10-CM

## 2019-01-07 DIAGNOSIS — M1990 Unspecified osteoarthritis, unspecified site: Secondary | ICD-10-CM

## 2019-01-07 DIAGNOSIS — Z87442 Personal history of urinary calculi: Secondary | ICD-10-CM

## 2019-01-07 MED ORDER — METHOCARBAMOL 500 MG PO TABS: 500.0000 mg | ORAL_TABLET | Freq: Every evening | ORAL | 0 refills | Status: DC | PRN

## 2019-01-07 NOTE — Progress Notes (Signed)
[facility] Progress Note   Summer [name] [id number] 1950/10/30 68 y.o.  Summer [name] is managed by Dr. Truitt Merle  Actively treated with chemotherapy/immunotherapy/hormonal therapy: yes  Current therapy: Paclitaxel and Herceptin  Last treated: 01/02/2019 (cycle 1, day 1)  Next scheduled appointment with provider: 01/09/2019  Assessment: Plan:    Malignant neoplasm of upper-outer quadrant of right breast in female, estrogen receptor positive ([place])  Arthralgia, unspecified joint  Myalgia - Plan: methocarbamol (ROBAXIN) 500 MG tablet  Neuropathy due to chemotherapeutic drug ([place])  Muscle spasm - Plan: methocarbamol (ROBAXIN) 500 MG tablet  Acute nonintractable headache, unspecified headache type   ER positive malignant neoplasm of the right breast: The patient continues to be managed by  Dr. Truitt Merle and is status post cycle 1, day 1 of paclitaxel and Herceptin which was dosed on 01/02/2019.  She is scheduled for follow-up and for her next treatment on 01/09/2019.  Arthralgias and myalgias: I discussed with the patient today that I believe that her arthralgias may be exacerbated by Taxol.  She was told that she could use Tylenol and Motrin.  She also has a prescription for oxycodone at home which she can use if needed.  She was instructed to continue using gabapentin at 1200 mg p.o. 3 times daily.  She has been taking this for her fibromyalgia.  Neuropathy: I discussed with the patient that she is already at the maximum dose of gabapentin.  I advised her to use Tylenol and Motrin as needed.  She additionally has a prescription for oxycodone at home which she can use if needed.  Headache suspected secondary to muscle spasms in the posterior neck: The patient was given a prescription of Robaxin 500 mg with instructions to take at bedtime.  Please see After Visit Summary for patient specific instructions.  Future Appointments  Date Time Provider  [place]  01/08/2019  3:45 PM Suzzanne Cloud, NP [facility] None  01/09/2019 10:00 AM [facility] 5 [facility] None  01/09/2019 10:15 AM [facility] Hand FLUSH [facility] None  01/09/2019 10:45 AM Truitt Merle, MD [facility] None  01/09/2019 11:30 AM [facility] INFUSION [facility] None  01/16/2019 10:15 AM [facility] LAB 2 [facility] None  01/16/2019 10:30 AM [facility] [place] FLUSH [facility] None  01/16/2019 11:00 AM [facility] INFUSION [facility] None  01/16/2019  2:00 PM Sueanne Margarita, MD [facility]  01/23/2019  9:00 AM [facility] LAB 2 [facility] None  01/23/2019  9:15 AM [facility] [place] FLUSH [facility] None  01/23/2019  9:45 AM Truitt Merle, MD [facility] None  01/23/2019 11:00 AM [facility] INFUSION [facility] None  01/30/2019 10:15 AM [facility] LAB 2 [facility] None  01/30/2019 10:30 AM [facility] [place] FLUSH [facility] None  01/30/2019 11:00 AM [facility] INFUSION [facility] None  02/06/2019 10:15 AM [facility] LAB 5 [facility] None  02/06/2019 10:30 AM [facility] [place] FLUSH [facility] None  02/06/2019 11:00 AM [facility] INFUSION [facility] None  04/02/2019  3:15 PM Deveshwar, Abel Presto, MD [facility] None    No orders of the defined types were placed in this encounter.      Subjective:   Patient ID:  Summer [name] is a 68 y.o. (DOB 1951-05-18) female.  Chief Complaint:  Chief Complaint  Patient presents with   Leg Swelling    HPI Summer [name] is a 68 year old female with a diagnosis of an ER positive malignant neoplasm of the right breast.  She is managed by  Dr. Truitt Merle and is status post cycle 1, day 1 of paclitaxel and  Herceptin which was dosed on 01/02/2019.  She is scheduled for her next treatment on 01/09/2019.  She presents to the office today with a report of headaches in the morning which are localized behind her eyes and in her frontal skull.  She also is having bilateral flank pain, side pain, and is having bony aches all over.  This is  especially occurring in her legs and hips.  She reports that she feels that her feet are on fire.  She also has having swelling in her ankles and feet.  She is currently taking gabapentin 1200 mg p.o. 3 times daily for fibromyalgia.  She has been taking Tylenol and has a prescription for oxycodone at home which she is not taking.  Medications: I have reviewed the patient's current medications.  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   Diverticulosis    Fibromyalgia    History of kidney stones    History of kidney stones    History of shingles 2012--  no residual pain   Left ureteral calculus    MCI (mild cognitive impairment)    Trigeminal neuralgia RIGHT   Urgency of urination     Past Surgical History:  Procedure Laterality Date   BREAST LUMPECTOMY WITH RADIOACTIVE SEED AND SENTINEL LYMPH NODE BIOPSY Right 11/28/2018   Procedure: RIGHT BREAST LUMPECTOMY WITH RADIOACTIVE SEED AND RIGHT AXILLARY SENTINEL LYMPH NODE BIOPSY;  Surgeon: Excell Seltzer, MD;  Location: [facility];  Service: General;  Laterality: Right;   CEREBRAL MICROVASCULAR DECOMPRESSION  07-05-2006   RIGHT TRIGEMINAL NERVE   CYSTOSCOPY/RETROGRADE/URETEROSCOPY/STONE EXTRACTION WITH BASKET  07/04/2012   Procedure: CYSTOSCOPY/RETROGRADE/URETEROSCOPY/STONE EXTRACTION WITH BASKET;  Surgeon: Claybon Jabs, MD;  Location: [facility];  Service: Urology;  Laterality: Left;   EXPLORATORY LAPAROTOMY/ RESECTION MID TO DISTAL SIGMOID AND PROXIMAL RECTUM/ END PROXIMAL SIGMOID COLOSTOMY  07-17-2006   PERFORATED DIVERTICULITIS WITH PERITONITIS   gamma knife  05/17/2016   for trigeminal neuralgia, [facility], Dr Salomon Fick   gamma knife  2019   PORTACATH PLACEMENT Left 11/28/2018   Procedure: INSERTION PORT-A-CATH  WITH ULTRASOUND;  Surgeon: Excell Seltzer, MD;  Location: [place] [facility];  Service: General;  Laterality: Left;   RESECTION COLOSTOMY/ CLOSURE COLOSTOMY WITH COLOPROCTOSTOMY  02-20-2007   TONSILLECTOMY  AS CHILD   URETEROSCOPY  07/04/2012   Procedure: URETEROSCOPY;  Surgeon: Claybon Jabs, MD;  Location: [facility];  Service: Urology;  Laterality: Left;   VAGINAL HYSTERECTOMY  1998   Partial   WISDOM TOOTH EXTRACTION      Family History  Problem Relation Age of Onset   Heart disease Maternal Grandmother    Diabetes Mother    Breast cancer Maternal Aunt    Lupus Cousin    Colon cancer Neg Hx    Esophageal cancer Neg Hx    Rectal cancer Neg Hx    Stomach cancer Neg Hx     Social History   Socioeconomic History   Marital status: Married    Spouse name: Not on file   Number of children: 1   Years of education: College   Highest education level: Not on file  Occupational History    Employer: UNEMPLOYED   Occupation: HUMAN RESOURCES     Employer: [employer]  Social Needs   Financial  resource strain: Not on file   Food insecurity:    Worry: Not on file    Inability: Not on file   Transportation needs:    Medical: Not on file    Non-medical: Not on file  Tobacco Use   Smoking status: Never Smoker   Smokeless tobacco: Never Used  Substance and Sexual Activity   Alcohol use: Yes    Comment: rarely   Drug use: No   Sexual activity: Not on file  Lifestyle   Physical activity:    Days per week: Not on file    Minutes per session: Not on file   Stress: Not on file  Relationships   Social connections:    Talks on phone: Not on file    Gets together: Not on file    Attends religious service: Not on file    Active member of club or organization: Not on file    Attends meetings of clubs or organizations: Not on file    Relationship status: Not on file   Intimate partner violence:    Fear of current or ex partner: Not on file      Emotionally abused: Not on file    Physically abused: Not on file    Forced sexual activity: Not on file  Other Topics Concern   Not on file  Social History Narrative   Not on file    Past Medical History, Surgical history, Social history, and Family history were reviewed and updated as appropriate.   Please see review of systems for further details on the patient's review from today.   Review of Systems:  Review of Systems  Constitutional: Negative for chills, diaphoresis and fever.  HENT: Negative for trouble swallowing and voice change.   Eyes: Positive for pain.  Respiratory: Negative for cough, chest tightness, shortness of breath and wheezing.   Cardiovascular: Positive for leg swelling. Negative for chest pain and palpitations.  Gastrointestinal: Negative for abdominal pain, constipation, diarrhea, nausea and vomiting.  Musculoskeletal: Positive for arthralgias and myalgias. Negative for back pain.  Neurological: Positive for headaches. Negative for dizziness and light-headedness.       Burning of the feet    Objective:   Physical Exam:  BP 133/61 (BP Location: Left Arm, Patient Position: Sitting)    Pulse 73    Temp 98.5 F (36.9 C) (Oral)    Resp 18    Ht 5\' 6"  (1.676 m)    Wt 184 lb 4.8 oz (83.6 kg)    SpO2 100%    BMI 29.75 kg/m  ECOG: 1  Physical Exam Constitutional:      General: She is not in acute distress.    Appearance: She is not diaphoretic.  HENT:     Head: Normocephalic and atraumatic.  Eyes:     General: No scleral icterus.       Right eye: No discharge.        Left eye: No discharge.     Conjunctiva/sclera: Conjunctivae normal.     Funduscopic exam:    Right eye: No hemorrhage, exudate, AV nicking or papilledema.        Left eye: No hemorrhage, exudate, AV nicking or papilledema.  Cardiovascular:     Rate and Rhythm: Normal rate and regular rhythm.     Heart sounds: Normal heart sounds. No murmur. No friction rub. No gallop.   Pulmonary:      Effort: Pulmonary effort is normal. No respiratory distress.     Breath sounds:  Normal breath sounds. No wheezing or rales.  Musculoskeletal:     Right lower leg: Edema present.     Left lower leg: Edema present.     Comments: Trace bilateral pitting edema to the mid calves. The muscles of the posterior neck are tight and are suggestive of muscle spasms.  Skin:    General: Skin is warm and dry.     Findings: No erythema or rash.  Neurological:     Mental Status: She is alert.     Coordination: Coordination normal.     Gait: Gait normal.     Lab Review:     Component Value Date/Time   NA 143 01/02/2019 0807   K 3.8 01/02/2019 0807   CL 106 01/02/2019 0807   CO2 28 01/02/2019 0807   GLUCOSE 89 01/02/2019 0807   GLUCOSE 96 08/20/2006 1433   BUN 11 01/02/2019 0807   CREATININE 0.73 01/02/2019 0807   CALCIUM 8.6 (L) 01/02/2019 0807   PROT 6.4 (L) 01/02/2019 0807   ALBUMIN 3.4 (L) 01/02/2019 0807   AST 15 01/02/2019 0807   ALT 8 01/02/2019 0807   ALKPHOS 69 01/02/2019 0807   BILITOT 0.4 01/02/2019 0807   GFRNONAA >60 01/02/2019 0807   GFRAA >60 01/02/2019 0807       Component Value Date/Time   WBC 4.5 01/02/2019 0807   WBC 7.9 10/12/2018 2328   RBC 4.22 01/02/2019 0807   HGB 11.2 (L) 01/02/2019 0807   HCT 37.3 01/02/2019 0807   HCT 30.4 (L) 11/19/2018 1543   PLT 214 01/02/2019 0807   MCV 88.4 01/02/2019 0807   MCH 26.5 01/02/2019 0807   MCHC 30.0 01/02/2019 0807   RDW 21.5 (H) 01/02/2019 0807   LYMPHSABS 1.5 01/02/2019 0807   MONOABS 0.4 01/02/2019 0807   EOSABS 0.1 01/02/2019 0807   BASOSABS 0.1 01/02/2019 0807   -------------------------------  Imaging from last 24 hours (if applicable):  Radiology interpretation: No results found.       This case was discussed with Dr. Burr Medico. She expressed agreement with my management of this patient.

## 2019-01-07 NOTE — Patient Instructions (Signed)

## 2019-01-08 ENCOUNTER — Encounter: Payer: Self-pay | Admitting: Neurology

## 2019-01-08 ENCOUNTER — Ambulatory Visit (INDEPENDENT_AMBULATORY_CARE_PROVIDER_SITE_OTHER): Payer: Federal, State, Local not specified - PPO | Admitting: Neurology

## 2019-01-08 DIAGNOSIS — G500 Trigeminal neuralgia: Secondary | ICD-10-CM

## 2019-01-08 NOTE — Progress Notes (Signed)
[place]   [phone] [phone]   Clinic Follow up Note   Patient Care Team: Hoyt Koch, MD as PCP - General (Internal Medicine) Mauro Kaufmann, RN as Oncology Nurse Navigator Rockwell Germany, RN as Oncology Nurse Navigator Excell Seltzer, MD as Consulting Physician (General Surgery) Truitt Merle, MD as Consulting Physician (Hematology) Gery Pray, MD as Consulting Physician (Radiation Oncology)  Date of Service:  01/09/2019  CHIEF COMPLAINT: F/u in right breast cancer  SUMMARY OF ONCOLOGIC HISTORY: Oncology History   Cancer Staging Malignant neoplasm of upper-outer quadrant of right breast in female, estrogen receptor positive ([place]) Staging form: Breast, AJCC 8th Edition - Clinical stage from 11/10/2018: Stage IA (cT1b, cN0, cM0, G2, ER+, PR+, HER2+) - Signed by Truitt Merle, MD on 11/18/2018       Malignant neoplasm of upper-outer quadrant of right breast in female, estrogen receptor positive ([place])   11/07/2018 Mammogram    Diagnostic 11/07/18 IMPRESSION: 1. 1 x 0.7 x 0.6 cm hypoechoic mass with distortion at the 9-9:30 position of the RIGHT breast 6 cm from the nipple with distortion in the UPPER-OUTER RIGHT breast, corresponding to the mammographic abnormality. One RIGHT axillary lymph node with borderline cortical thickness. Tissue sampling of the RIGHT breast mass and borderline RIGHT axillary lymph node recommended. ADDENDUM: Patient returned today for biopsy of a single borderline thickened lymph node in the RIGHT axilla. I could not reproduce a morphologically abnormal lymph node today, possibly resolved in the interval. Today, only normal appearing lymph nodes were identified in the RIGHT axilla. As such, the axillary ultrasound-guided biopsy was canceled.    11/10/2018 Cancer Staging    Staging form: Breast, AJCC 8th Edition - Clinical stage from 11/10/2018: Stage IA (cT1b, cN0, cM0, G2, ER+, PR+, HER2+) - Signed  by Truitt Merle, MD on 11/18/2018    11/11/2018 Initial Biopsy    Diagnosis 11/11/18 Breast, right, needle core biopsy, upper outer - 9:30 o'clock position - INVASIVE DUCTAL CARCINOMA, GRADE II/III. - SEE MICROSCOPIC DESCRIPTION.    11/11/2018 Receptors her2    Results: IMMUNOHISTOCHEMICAL AND MORPHOMETRIC ANALYSIS PERFORMED MANUALLY The tumor cells are POSITIVE for Her2 (3+). Estrogen Receptor: 95%, POSITIVE, STRONG STAINING INTENSITY Progesterone Receptor: 5%, POSITIVE, STRONG STAINING INTENSITY Proliferation Marker Ki67: 20%    11/17/2018 Initial Diagnosis    Malignant neoplasm of upper-outer quadrant of right breast in female, estrogen receptor positive ([place])    11/28/2018 Surgery    RIGHT BREAST LUMPECTOMY WITH RADIOACTIVE SEED AND RIGHT AXILLARY SENTINEL LYMPH NODE BIOPSY by Dr. Excell Seltzer  11/28/18     11/28/2018 Pathology Results    Diagnosis 11/28/18  1. Breast, lumpectomy, Right w/seed - INVASIVE DUCTAL CARCINOMA, 1.6 CM, [place] GRADE 2 OF 3. - MARGINS OF RESECTION ARE NOT INVOLVED (CLOSEST MARGIN: LESS THAN 1 MM, ANTERIOR). - DUCTAL CARCINOMA IN SITU. - BIOPSY SITE CHANGES. - SEE ONCOLOGY TABLE. 2. Lymph node, sentinel, biopsy, right Axillary - ONE LYMPH NODE, NEGATIVE FOR CARCINOMA (0/1). 3. Lymph node, sentinel, biopsy, right Axillary - ONE LYMPH NODE, NEGATIVE FOR CARCINOMA (0/1).    11/28/2018 Cancer Staging    Staging form: Breast, AJCC 8th Edition - Pathologic stage from 11/28/2018: Stage IA (pT1c, pN0, cM0, G2, ER+, PR+, HER2+) - Signed by Truitt Merle, MD on 12/11/2018    12/22/2018 Procedure    Baseline ECHO 12/22/18  IMPRESSIONS  1. The left ventricle has normal systolic function with an ejection fraction of 60-65%. The cavity size was normal. There is mild concentric left  ventricular hypertrophy. Left ventricular diastolic parameters were normal.  2. The right ventricle has normal systolic function. The cavity was normal. There is no increase in right ventricular wall  thickness.  3. Left atrial size was moderately dilated.  4. The aortic valve is tricuspid. Aortic valve regurgitation was not assessed by color flow Doppler.     01/02/2019 -  Chemotherapy    adjuvant chemo weekly Taxol with Herceptin q3weeks starting 01/02/19       CURRENT THERAPY:  -adjuvant chemo weekly Taxol with Herceptin q3weeks starting 01/02/19 -IV iron as needed  INTERVAL HISTORY:  [name] is here for a follow up and treatment. She presents to the clinic today by herself. She notes her first cycle chemo went well. She notes 2 days later she had a headache and in her bones, Left more than right and in her feet. She tried to stand but could not walk on them well. She iced her feet to try to get relief. This lasted for 1 day. She took Tylenol for this. She saw Eliseo Squires the following day and he gave her Robaxin. She notes last night she had pain only in her left middle toe and iced her foot again and used Robaxin. She denies change in energy and denies having nausea. She notes her cat scratched the middle of her chest. She cleaned it and put neosporin on it. She notes after adhesion is put on her port her skin would start to itch.     REVIEW OF SYSTEMS:   Constitutional: Denies fevers, chills or abnormal weight loss Eyes: Denies blurriness of vision Ears, nose, mouth, throat, and face: Denies mucositis or sore throat Respiratory: Denies cough, dyspnea or wheezes Cardiovascular: Denies palpitation, chest discomfort or lower extremity swelling Gastrointestinal:  Denies nausea, heartburn or change in bowel habits Skin: Denies abnormal skin rashes (+) Mid chest scratch  Lymphatics: Denies new lymphadenopathy or easy bruising Neurological:Denies numbness, tingling or new weaknesses (+) Mild pain of left middle toe  Behavioral/Psych: Mood is stable, no new changes  All other systems were reviewed with the patient and are negative.  MEDICAL HISTORY:  Past Medical History:   Diagnosis Date  . Arthritis shoulders and back  . 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 Diverticulosis   . Fibromyalgia   . History of kidney stones   . History of kidney stones   . History of shingles 2012--  no residual pain  . Left ureteral calculus   . MCI (mild cognitive impairment)   . Trigeminal neuralgia RIGHT  . Urgency of urination     SURGICAL HISTORY: Past Surgical History:  Procedure Laterality Date  . BREAST LUMPECTOMY WITH RADIOACTIVE SEED AND SENTINEL LYMPH NODE BIOPSY Right 11/28/2018   Procedure: RIGHT BREAST LUMPECTOMY WITH RADIOACTIVE SEED AND RIGHT AXILLARY SENTINEL LYMPH NODE BIOPSY;  Surgeon: Excell Seltzer, MD;  Location: [facility];  Service: General;  Laterality: Right;  . CEREBRAL MICROVASCULAR DECOMPRESSION  07-05-2006   RIGHT TRIGEMINAL NERVE  . CYSTOSCOPY/RETROGRADE/URETEROSCOPY/STONE EXTRACTION WITH BASKET  07/04/2012   Procedure: CYSTOSCOPY/RETROGRADE/URETEROSCOPY/STONE EXTRACTION WITH BASKET;  Surgeon: Claybon Jabs, MD;  Location: [facility];  Service: Urology;  Laterality: Left;  . EXPLORATORY LAPAROTOMY/ RESECTION MID TO DISTAL SIGMOID AND PROXIMAL RECTUM/ END PROXIMAL SIGMOID COLOSTOMY  07-17-2006   PERFORATED DIVERTICULITIS WITH PERITONITIS  . gamma knife  05/17/2016   for trigeminal neuralgia, [facility], Dr Salomon Fick  . gamma knife  2019  .  PORTACATH PLACEMENT Left 11/28/2018   Procedure: INSERTION PORT-A-CATH WITH ULTRASOUND;  Surgeon: Excell Seltzer, MD;  Location: [facility];  Service: General;  Laterality: Left;  . RESECTION COLOSTOMY/ CLOSURE COLOSTOMY WITH COLOPROCTOSTOMY  02-20-2007  . TONSILLECTOMY  AS CHILD  . URETEROSCOPY  07/04/2012   Procedure: URETEROSCOPY;  Surgeon: Claybon Jabs, MD;  Location: [facility];  Service: Urology;  Laterality: Left;  Marland Kitchen VAGINAL HYSTERECTOMY  1998   Partial  . WISDOM TOOTH EXTRACTION      I have reviewed the social  history and family history with the patient and they are unchanged from previous note.  ALLERGIES:  is allergic to clindamycin hcl.  MEDICATIONS:  Current Outpatient Medications  Medication Sig Dispense Refill  . acetaminophen (TYLENOL) 500 MG tablet Take 1,000 mg by mouth every 6 (six) hours as needed (pain).     Marland Kitchen gabapentin (NEURONTIN) 600 MG tablet TAKE 2 TABLETS TID. MAX OF SIX TABS PER DAY. (Patient taking differently: Take 1,200 mg by mouth 3 (three) times daily. TAKE 2 TABLETS TID. MAX OF SIX TABS PER DAY.) 540 tablet 3  . LamoTRIgine 200 MG TB24 24 hour tablet Take 1 tablet (200 mg total) by mouth at bedtime. (Patient taking differently: Take 100 mg by mouth at bedtime. ) 30 tablet 11  . lidocaine-prilocaine (EMLA) cream Apply to affected area once 30 g 3  . methocarbamol (ROBAXIN) 500 MG tablet Take 1 tablet (500 mg total) by mouth at bedtime as needed for muscle spasms. 30 tablet 0  . ondansetron (ZOFRAN) 8 MG tablet Take 1 tablet (8 mg total) by mouth 2 (two) times daily as needed (Nausea or vomiting). 30 tablet 1  . oxyCODONE (OXY IR/ROXICODONE) 5 MG immediate release tablet Take 1 tablet (5 mg total) by mouth every 6 (six) hours as needed for moderate pain, severe pain or breakthrough pain. 10 tablet 0  . prochlorperazine (COMPAZINE) 10 MG tablet Take 1 tablet (10 mg total) by mouth every 6 (six) hours as needed (Nausea or vomiting). 30 tablet 1   No current facility-administered medications for this visit.    Facility-Administered Medications Ordered in Other Visits  Medication Dose Route Frequency Provider Last Rate Last Dose  . sodium chloride flush (NS) 0.9 % injection 10 mL  10 mL Intracatheter PRN Truitt Merle, MD   10 mL at 01/09/19 1507    PHYSICAL EXAMINATION: ECOG PERFORMANCE STATUS: 1 - Symptomatic but completely ambulatory  Vitals:   01/09/19 1129  BP: (!) 139/59  Pulse: 71  Resp: 18  Temp: 100 F (37.8 C)  SpO2: 100%   Filed Weights   01/09/19 1129   Weight: 187 lb 6.4 oz (85 kg)    GENERAL:alert, no distress and comfortable SKIN: skin color, texture, turgor are normal, no rashes or significant lesions EYES: normal, Conjunctiva are pink and non-injected, sclera clear OROPHARYNX:no exudate, no erythema and lips, buccal mucosa, and tongue normal  NECK: supple, thyroid normal size, non-tender, without nodularity LYMPH:  no palpable lymphadenopathy in the cervical, axillary or inguinal LUNGS: clear to auscultation and percussion with normal breathing effort HEART: regular rate & rhythm and no murmurs and no lower extremity edema ABDOMEN:abdomen soft, non-tender and normal bowel sounds Musculoskeletal:no cyanosis of digits and no clubbing  NEURO: alert & oriented x 3 with fluent speech, no focal motor/sensory deficits  LABORATORY DATA:  I have reviewed the data as listed CBC Latest Ref Rng & Units 01/09/2019 01/02/2019 12/12/2018  WBC 4.0 - 10.5 K/uL 4.7 4.5  4.6  Hemoglobin 12.0 - 15.0 g/dL 10.6(L) 11.2(L) 10.3(L)  Hematocrit 36.0 - 46.0 % 34.9(L) 37.3 35.2(L)  Platelets 150 - 400 K/uL 196 214 279     CMP Latest Ref Rng & Units 01/09/2019 01/02/2019 12/12/2018  Glucose 70 - 99 mg/dL 119(H) 89 94  BUN 8 - 23 mg/dL 16 11 8   Creatinine 0.44 - 1.00 mg/dL 0.75 0.73 0.82  Sodium 135 - 145 mmol/L 145 143 143  Potassium 3.5 - 5.1 mmol/L 3.7 3.8 3.9  Chloride 98 - 111 mmol/L 109 106 106  CO2 22 - 32 mmol/L 28 28 28   Calcium 8.9 - 10.3 mg/dL 8.6(L) 8.6(L) 9.2  Total Protein 6.5 - 8.1 g/dL 6.5 6.4(L) 7.0  Total Bilirubin 0.3 - 1.2 mg/dL 0.3 0.4 0.6  Alkaline Phos 38 - 126 U/L 74 69 69  AST 15 - 41 U/L 21 15 15   ALT 0 - 44 U/L 18 8 10       RADIOGRAPHIC STUDIES: I have personally reviewed the radiological images as listed and agreed with the findings in the report. No results found.   ASSESSMENT & PLAN:  Summer [name] is a 68 y.o. female with   1.Malignant neoplasm of upper-outer quadrant of right breast, StageIA, cT1b,N0,M0),  ER/PR+, HER2+, GradeII -She was recently diagnosed in 11/2018. She is s/p right breastlumpectomyand SLNB as of 11/28/18.  -We reviewed anddiscussedthe pathology results which showed her small grade II tumor was completely resected with negative margins and node negative. -due to her early stage disease, I do not think she needs staging scans -I discussed her HER2 positive grade II disease is more aggressive increases her risk of recurrence. I recommend adjuvant chemo to reduce her risk of recurrence before proceed ing with adjuvant radiation to reduce her risk of local recurrence and anti-estrogen therapy for 5-7 years to reduce her risk of distant recurrence.  -She started adjuvant weekly Taxol with Herceptinon 01/02/19. She tolerated first cycle poorly with bone/muscle pain and headaches. She saw PA Vann who gave her Robaxin. This has improved. She has recovered well   -She will start ice bags during infusion to reduce her effects. She is agreeable.  -if she has persistent bone pain, will change Taxol to Abraxane  -Labs reviewed, CBC and CMP WNL except Hg 10.6, BG 119, Ca 8.6. Overall adequate to proceed with Taxol today.   -F/u in 1 week    2. Anemia,iron deficiency -Pt notes she has been anemic in the past year -Last colonoscopy was in 04/2012 which wasunremarkable except diverticulosis.  -She had partial colectomy in the past due to severe diverticulosis. -This is possibly iron deficiency from diverticulosis.She denies any current bleeding.  -After COVID-19 improves and her chemo treatment I will refer herbackto Dr. Carlean Purl for colonoscopy -Her workup showed iron 17, ferritin 4, normal retic ct, folate Vitamin B12 and SPEP panels. -IV iron was given 11/26/18 and 12/12/18.  -Hg at 10.6 (01/09/19).  -will monitor her iron level every month    3.Trigeminal neuralgia  -on meds, stable  4. Neuropathy, Muscle pain  -Secondary to chemo Taxol -Will start ice bags with cycle 2  Taxol  -I discussed neuropathy can progress with more treatment. Will monitor  -PA Eliseo Squires started her on Robaxin. She can take Tylenol in between for breakthrough pain.    PLAN: -Labs reviewed and adequate to proceed with Taxol today and continue weekly  -f/u in 2 weeks    No problem-specific Assessment & Plan notes found for this encounter.  No orders of the defined types were placed in this encounter.  All questions were answered. The patient knows to call the clinic with any problems, questions or concerns. No barriers to learning was detected. I spent 20 minutes counseling the patient face to face. The total time spent in the appointment was 25 minutes and more than 50% was on counseling and review of test results     Truitt Merle, MD 01/09/2019   I, Joslyn Devon, am acting as scribe for Truitt Merle, MD.   I have reviewed the above documentation for accuracy and completeness, and I agree with the above.

## 2019-01-08 NOTE — Progress Notes (Signed)
Virtual Visit via Telephone Note  I connected with Summer [name] on 01/08/19 at  3:45 PM EDT by telephone and verified that I am speaking with the correct person using two identifiers.   I discussed the limitations, risks, security and privacy concerns of performing an evaluation and management service by telephone and the availability of in person appointments. I also discussed with the patient that there may be a patient responsible charge related to this service. The patient expressed understanding and agreed to proceed.   History of Present Illness: HISTORY 10/10/17 [name] is a 68 years old right-handed African American female, referred by her primary care physician Summer [name] for evaluation of right facial pain  She was diagnosed with right trigeminal neuralgia in 2006, presenting with severe electronics shooting pain at her right upper molar, she had microvascular decompression surgery at [name] by [name] around 2006, which has helped the severe pain, but she continued to have annoying almost constant pain at her right face, she describes hot cold sensitivity over right face, touching of right forehead region will also set of her right facial pain, she felt right tongue swelling, she bites on her right tongue sometimes, she works at customer service, on the phone many hours each day, with prolonged talking, she felt sandpaper was rubbing against her right tongue, also complains of right upper and lower lip numbness, intermittent electronics shooting pain,  Over the years, she has tried different medication, was evaluated by our office Summer [name] in 2008, she has tried gabapentin, Lyrica, Cymbalta, Trileptal, currently is taking Tegretol 200 mg twice a day, initially it helps her some, but failed to help work consistently, She denied visual change, recent few months of left lower back, left hip pain, radiating to left posterior thigh, to her left  leg and left foot, difficulty walking after prolonged sitting She complains of left low back pain, left hip pain, radiating pain to left foot, like walking on a dead, burning sensation, she continued to complains of right facial constant achy pain, there was no significant change after started on Oxtellar xR 300 mg every day, no significant side effects,  I have reviewed MRI, mild small vessel disease, no significant abnormality otherwise,  She had MRI lumbar spine (without) demonstrating: L5-S1: facet hypertrophy with moderate right and severe left foraminal stenosis  L4-5: pseudo disc bulging and facet hypertrophy with moderate right and mild left foraminal stenosis. There is 2 mm anterior spondylolisthesis of L4 on L5.  Degenerative spondylosis and disc bulging L1-2 and L4-5.  UPDATE Sep 30 2014:YY She felt sand paper sensation in her right tongue, flashing sensation, last 1-2 weeks, only take gabapentin 330m tid now. She also complains of snoring, frequent wakening during the nighttime, excessive sleepiness at daytime, F ESS score is 37, ESS is 9  UPDATE 01/26/2017CMMs. LJabbour 68year old female returns for follow-up. She has a history of right trigeminal neuralgia is currently on Neurontin 300 mg 3 times a day has breakthrough pain. 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 She occasionally will have upper and lower lip numbness. She returns for reevaluation  UPDATE February 09 2016:YY She complained increased right facial pain since April 2017, involving right V1/V2 branch, she is taking gabapentin 300 mg 4 times a day, only helped her mildly, triggered by wind blow on her right face, talking,  chewing, she works as a Restaurant manager, fast food, it is very difficult for her to perform her job,  Update March 01 2016:YY She presented to the emergency room February 21 2016 for nausea, unexpected fall, night  before that because severe facial pain, she took extra dose of lamotrigine, made it to 250 mg that particular day, laboratory evaluation showed mildly low potassium of CMP WAS GIVEN SUPPLEMENT, NORMAL CBC, lamotrigine level in June 15 was 15.6,  For a while, she had severe right facial pain, was taking gabapentin 300 mg 2 tablets every 2 hours, 3 tablets at night, about 11 tablets each day 3300 mg every day plus lamotrigine 100 mg twice a day,   Lamotrigine 100 mg twice a day has been very helpful, it has taking care of her long bothered right tongue sandpaper sensation. She is now taking gabapentin 300 mg 3/2/3 tablets each day, she has been off lamotrigine since February 26 2016, the sandpaper sensation on the right side of the tongue came back, she is now taking Cymbalta 30 mg 3 times a day  She has been complains 3 weeks history of urinary urgency, difficulty initiating urine, UA showed mild UTI, was treated with Cipro without improving her symptoms  UPDATE May 10 2016:YY She was evaluated by Dr. Salomon [name] in July 2017, is planning on to have gamma knife in early September, she complains of mild memory loss, difficulty handling her computer tasks sometimes, there was no family history of memory loss, she also complains of episode, right after she get out of the bed, She is on polypharmacy, now taking lamotrigine 100 mg twice a day, gabapentin 300 mg 4 in the morning, 2 at lunch time, 4 tablets at nighttime. She also has obesity, snoring, excessive daytime fatigue and sleepiness  We have personally reviewed CT head without contrast in June 2017, no acute abnormality, MRI of the cervical in 2008, multilevel degenerative changes, most severe at C5-6, C6-7, with herniated disc, mild canal stenosis, mild cord signal changes.  Laboratory evaluation in 2017, elevated WBC 11 point 8, hemoglobin of 13, no significant abnormality on BMP  UPDATE Apr 02 2017:YY We have personally reviewed MRI of the  brain in April 2018 generalized atrophy mild supratentorium small vessel disease MRI of cervical spine April, multilevel degenerative changes most severe at C6-7, mild canal stenosissignificant foraminal narrowing or signal changes,  She had a, knife by [facility] Dr. Angelene Giovanni in September 2017 she denies significant improvement, now cold air still trigger her right facial pain, bleeding from right temporal region along the right face, right jaw, electronic shooting pain, to her right lower molar region, she has intermittent left handtremor,driving,  She is now taking Lamictal 100 mg 3 tablets each day, gabapentin 600 mg 5 tablets each day, despite combination therapy, she remains symptomatic, she is able to go to work. She is on the phone 10 hours a day, mild memory loss. I reviewed laboratory evaluation in 2018, negative troponin, CBC with hemoglobin of 11.9, CMP, creatinine of 0.84, ESR, mild elevated C reactive protein 7.8, lamotrigine 12.9, TSH 1.2 7,  UPDATE 06/26/17 [name], 68 year old female returns for follow-up with history of trigeminal neuralgia. Her facial pain is not controlled. Patient continues to have right-sided facial pain and her tongue feels like sandpaper after being on her job for 10 hours a day. She has gargled with salt water. She works at a call center. She is currently on Lamictal 100 twice daily, Oxtellar XR 300 mg daily, gabapentin  672m 2 tabs in the a.m., 3 tabs at lunch 1-2 at Night. She denies side effects to the meds.She denies any falls. Tremor is stable She had Gamma knife by Dr. TSalomon Fickin September 2017. She has not followed up with him. He gabapentin level XI.2 on 04/02/2017. Lamictal level 11.3.These are therapeutic CBC and CMP in June within normal limits .She returns for reevaluation UPDATE1/31/2019CM.Mr. [name],68year old female returns for follow-up with history of right facial pain.When last seen her Oxtellar dose was increased with good  benefit. Her facial pain is in control. She is also on gabapentin and Lamictal. She denies any side effects to the medication. She had gamma knife surgery by Dr. TLawernce PittsSeptember 2017 Most recent comprehensive metabolic panel with sodium level of 140.and CBC within normal limits. She continues to work at a call center. She returns for reevaluation  UPDATE Oct 13 2018: She started Lamotrigine xr [id number] since Jan 27th 2020, she denies rash, but has itching all over including her face, skull, she has been on lamotrigine 100 to 200 mg daily for many years, had a similar itching episode in the past,  10/13/2018 Dr. YaKrista BlueMs. LlSchicklings a [age]ear old female presenting for follow-up for trigeminal neuralgia. She has had 2 gamma knife procedures most recent in 2019. She thinks she isn't feeling as good as she had hoped, in fact she thinks it may have made her worse. She still has pain to right side of her face. Pain under her eye, and pain under her chin. Her tongue sometimes feels like sandpaper. She is taking gabapentin 600 mg capsules, three in the morning, three midday, two at bedtime. When she takes the gabapentin it settles the pain down, making it manageable. Rarely she will take lamictal at bedtime. She reports 25% of the day she is pain free. In the morning is the worst pain. She doesn't like Oxtellar, because it doesn't work in her opinion. In the past she has tried lyrica, gabapentin, tegretol, trileptal, and cymbalta. 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    Update January 08 2019 SS: Taking Lamictal 200 ER once at bedtime, gabapentin 600 mg tablets, 6 tablets daily, history of whole body rash, itchiness,   Is currently getting chemotherapy for breast cancer, weekly Taxol with Herceptin every 3 weeks, started 01/02/2019,   Long history of trigeminal neuralgia, on the right side.  She reports her pain is better, she works at a call center, has to talk all day, this  can be exacerbating for the pain, she still has a sandpaper sensation to her tongue, some numbness on the right side of her lips, overall her pain is doing better.  She is happy with her current regimen of medication, the gabapentin works very well for her pain, she takes Lamictal extended release 200 mg at bedtime, reports she is tolerating medication well.  She denies any trouble walking or with balance, no falls.  She reports the rash she developed in February 2020 has subsided, in fact she can't even remember it   Observations/Objective: Alert, answers questions appropriately, speech is clear and concise, knowledgeable health condition  Assessment and Plan: 1.  Trigeminal neuralgia, right side  Overall her pain is under good control.  She will continue taking Lamictal extended release 200 mg at bedtime, gabapentin 600 mg tablets, 3 tablets in the morning, 3 tablets at lunch.  We discussed she should not exceed max dose of gabapentin, 3600 mg a day.  She  is currently undergoing chemotherapy treatment for breast cancer.  She is tolerating the Lamictal, without side effect.  Her Lamictal level was checked in January 2020, was normal, 5.3.  At her next visit we will recheck a Lamictal level.  She does not need refills at this time.  Follow Up Instructions: 3 to 4 months for an office revisit   I discussed the assessment and treatment plan with the patient. The patient was provided an opportunity to ask questions and all were answered. The patient agreed with the plan and demonstrated an understanding of the instructions.   The patient was advised to call back or seek an in-person evaluation if the symptoms worsen or if the condition fails to improve as anticipated.  I provided 23 minutes of non-face-to-face time during this encounter.  Evangeline Dakin, DNP  [facility] [address] [place], [place] [address] [phone]

## 2019-01-09 ENCOUNTER — Inpatient Hospital Stay: Payer: Federal, State, Local not specified - PPO | Attending: Hematology

## 2019-01-09 ENCOUNTER — Inpatient Hospital Stay: Payer: Federal, State, Local not specified - PPO

## 2019-01-09 ENCOUNTER — Inpatient Hospital Stay (HOSPITAL_BASED_OUTPATIENT_CLINIC_OR_DEPARTMENT_OTHER): Payer: Federal, State, Local not specified - PPO | Admitting: Hematology

## 2019-01-09 ENCOUNTER — Encounter: Payer: Self-pay | Admitting: Hematology

## 2019-01-09 ENCOUNTER — Other Ambulatory Visit: Payer: Self-pay

## 2019-01-09 ENCOUNTER — Telehealth: Payer: Self-pay | Admitting: *Deleted

## 2019-01-09 VITALS — BP 139/59 | HR 71 | Temp 100.0°F | Resp 18 | Ht 66.0 in | Wt 187.4 lb

## 2019-01-09 DIAGNOSIS — L299 Pruritus, unspecified: Secondary | ICD-10-CM | POA: Diagnosis not present

## 2019-01-09 DIAGNOSIS — D509 Iron deficiency anemia, unspecified: Secondary | ICD-10-CM | POA: Diagnosis not present

## 2019-01-09 DIAGNOSIS — R5383 Other fatigue: Secondary | ICD-10-CM | POA: Insufficient documentation

## 2019-01-09 DIAGNOSIS — Z95828 Presence of other vascular implants and grafts: Secondary | ICD-10-CM

## 2019-01-09 DIAGNOSIS — Z87442 Personal history of urinary calculi: Secondary | ICD-10-CM | POA: Insufficient documentation

## 2019-01-09 DIAGNOSIS — R439 Unspecified disturbances of smell and taste: Secondary | ICD-10-CM | POA: Diagnosis not present

## 2019-01-09 DIAGNOSIS — I351 Nonrheumatic aortic (valve) insufficiency: Secondary | ICD-10-CM | POA: Insufficient documentation

## 2019-01-09 DIAGNOSIS — R51 Headache: Secondary | ICD-10-CM | POA: Insufficient documentation

## 2019-01-09 DIAGNOSIS — Z5111 Encounter for antineoplastic chemotherapy: Secondary | ICD-10-CM | POA: Insufficient documentation

## 2019-01-09 DIAGNOSIS — M797 Fibromyalgia: Secondary | ICD-10-CM | POA: Insufficient documentation

## 2019-01-09 DIAGNOSIS — C50411 Malignant neoplasm of upper-outer quadrant of right female breast: Secondary | ICD-10-CM

## 2019-01-09 DIAGNOSIS — M1990 Unspecified osteoarthritis, unspecified site: Secondary | ICD-10-CM | POA: Diagnosis not present

## 2019-01-09 DIAGNOSIS — G500 Trigeminal neuralgia: Secondary | ICD-10-CM | POA: Diagnosis not present

## 2019-01-09 DIAGNOSIS — Z170 Estrogen receptor positive status [ER+]: Secondary | ICD-10-CM

## 2019-01-09 DIAGNOSIS — R630 Anorexia: Secondary | ICD-10-CM | POA: Insufficient documentation

## 2019-01-09 DIAGNOSIS — Z79899 Other long term (current) drug therapy: Secondary | ICD-10-CM

## 2019-01-09 DIAGNOSIS — R634 Abnormal weight loss: Secondary | ICD-10-CM | POA: Insufficient documentation

## 2019-01-09 DIAGNOSIS — M791 Myalgia, unspecified site: Secondary | ICD-10-CM | POA: Insufficient documentation

## 2019-01-09 DIAGNOSIS — G893 Neoplasm related pain (acute) (chronic): Secondary | ICD-10-CM | POA: Insufficient documentation

## 2019-01-09 DIAGNOSIS — R040 Epistaxis: Secondary | ICD-10-CM | POA: Insufficient documentation

## 2019-01-09 DIAGNOSIS — L02425 Furuncle of right lower limb: Secondary | ICD-10-CM | POA: Insufficient documentation

## 2019-01-09 DIAGNOSIS — T451X5S Adverse effect of antineoplastic and immunosuppressive drugs, sequela: Secondary | ICD-10-CM

## 2019-01-09 DIAGNOSIS — Z5112 Encounter for antineoplastic immunotherapy: Secondary | ICD-10-CM | POA: Insufficient documentation

## 2019-01-09 DIAGNOSIS — G620 Drug-induced polyneuropathy: Secondary | ICD-10-CM

## 2019-01-09 LAB — CMP (CANCER CENTER ONLY)
ALT: 18 U/L (ref 0–44)
AST: 21 U/L (ref 15–41)
Albumin: 3.4 g/dL — ABNORMAL LOW (ref 3.5–5.0)
Alkaline Phosphatase: 74 U/L (ref 38–126)
Anion gap: 8 (ref 5–15)
BUN: 16 mg/dL (ref 8–23)
CO2: 28 mmol/L (ref 22–32)
Calcium: 8.6 mg/dL — ABNORMAL LOW (ref 8.9–10.3)
Chloride: 109 mmol/L (ref 98–111)
Creatinine: 0.75 mg/dL (ref 0.44–1.00)
GFR, Est AFR Am: >60 mL/min (ref >60)
GFR, Estimated: >60 mL/min (ref >60)
Glucose, Bld: 119 mg/dL — ABNORMAL HIGH (ref 70–99)
Potassium: 3.7 mmol/L (ref 3.5–5.1)
Sodium: 145 mmol/L (ref 135–145)
Total Bilirubin: 0.3 mg/dL (ref 0.3–1.2)
Total Protein: 6.5 g/dL (ref 6.5–8.1)

## 2019-01-09 LAB — CBC WITH DIFFERENTIAL (CANCER CENTER ONLY)
Abs Immature Granulocytes: 0.08 10*3/uL — ABNORMAL HIGH (ref 0.00–0.07)
Basophils Absolute: 0.1 10*3/uL (ref 0.0–0.1)
Basophils Relative: 1 %
Eosinophils Absolute: 0.1 10*3/uL (ref 0.0–0.5)
Eosinophils Relative: 2 %
HCT: 34.9 % — ABNORMAL LOW (ref 36.0–46.0)
Hemoglobin: 10.6 g/dL — ABNORMAL LOW (ref 12.0–15.0)
Immature Granulocytes: 2 %
Lymphocytes Relative: 24 %
Lymphs Abs: 1.1 10*3/uL (ref 0.7–4.0)
MCH: 27.4 pg (ref 26.0–34.0)
MCHC: 30.4 g/dL (ref 30.0–36.0)
MCV: 90.2 fL (ref 80.0–100.0)
Monocytes Absolute: 0.2 10*3/uL (ref 0.1–1.0)
Monocytes Relative: 4 %
Neutro Abs: 3.2 10*3/uL (ref 1.7–7.7)
Neutrophils Relative %: 67 %
Platelet Count: 196 10*3/uL (ref 150–400)
RBC: 3.87 MIL/uL (ref 3.87–5.11)
RDW: 20.8 % — ABNORMAL HIGH (ref 11.5–15.5)
WBC Count: 4.7 10*3/uL (ref 4.0–10.5)
nRBC: 0 % (ref 0.0–0.2)

## 2019-01-09 MED ORDER — SODIUM CHLORIDE 0.9% FLUSH
10.0000 mL | INTRAVENOUS | Status: DC | PRN
  Administered 2019-01-09: 10 mL
  Filled 2019-01-09: qty 10

## 2019-01-09 MED ORDER — ACETAMINOPHEN 325 MG PO TABS
650.0000 mg | ORAL_TABLET | Freq: Once | ORAL | Status: AC
  Administered 2019-01-09: 650 mg via ORAL

## 2019-01-09 MED ORDER — DEXAMETHASONE SODIUM PHOSPHATE 10 MG/ML IJ SOLN
INTRAMUSCULAR | Status: AC
  Filled 2019-01-09: qty 1

## 2019-01-09 MED ORDER — FAMOTIDINE IN NACL 20-0.9 MG/50ML-% IV SOLN
20.0000 mg | Freq: Once | INTRAVENOUS | Status: AC
  Administered 2019-01-09: 12:00:00 20 mg via INTRAVENOUS

## 2019-01-09 MED ORDER — DEXAMETHASONE SODIUM PHOSPHATE 10 MG/ML IJ SOLN
10.0000 mg | Freq: Once | INTRAMUSCULAR | Status: AC
  Administered 2019-01-09: 10 mg via INTRAVENOUS

## 2019-01-09 MED ORDER — SODIUM CHLORIDE 0.9% FLUSH
10.0000 mL | INTRAVENOUS | Status: DC | PRN
  Administered 2019-01-09: 10 mL via INTRAVENOUS
  Filled 2019-01-09: qty 10

## 2019-01-09 MED ORDER — DIPHENHYDRAMINE HCL 50 MG/ML IJ SOLN
50.0000 mg | Freq: Once | INTRAMUSCULAR | Status: AC
  Administered 2019-01-09: 50 mg via INTRAVENOUS

## 2019-01-09 MED ORDER — FAMOTIDINE IN NACL 20-0.9 MG/50ML-% IV SOLN
INTRAVENOUS | Status: AC
  Filled 2019-01-09: qty 50

## 2019-01-09 MED ORDER — HEPARIN SOD (PORK) LOCK FLUSH 100 UNIT/ML IV SOLN
500.0000 [IU] | Freq: Once | INTRAVENOUS | Status: AC | PRN
  Administered 2019-01-09: 500 [IU]
  Filled 2019-01-09: qty 5

## 2019-01-09 MED ORDER — ACETAMINOPHEN 325 MG PO TABS
ORAL_TABLET | ORAL | Status: AC
  Filled 2019-01-09: qty 2

## 2019-01-09 MED ORDER — SODIUM CHLORIDE 0.9 % IV SOLN
80.0000 mg/m2 | Freq: Once | INTRAVENOUS | Status: AC
  Administered 2019-01-09: 162 mg via INTRAVENOUS
  Filled 2019-01-09: qty 27

## 2019-01-09 MED ORDER — SODIUM CHLORIDE 0.9 % IV SOLN
Freq: Once | INTRAVENOUS | Status: AC
  Administered 2019-01-09: 12:00:00 via INTRAVENOUS
  Filled 2019-01-09: qty 250

## 2019-01-09 MED ORDER — TRASTUZUMAB CHEMO 150 MG IV SOLR
2.0000 mg/kg | Freq: Once | INTRAVENOUS | Status: AC
  Administered 2019-01-09: 13:00:00 168 mg via INTRAVENOUS
  Filled 2019-01-09: qty 8

## 2019-01-09 MED ORDER — DIPHENHYDRAMINE HCL 50 MG/ML IJ SOLN
INTRAMUSCULAR | Status: AC
  Filled 2019-01-09: qty 1

## 2019-01-09 NOTE — Patient Instructions (Signed)

## 2019-01-09 NOTE — Telephone Encounter (Signed)
Left message for patient to follow up. Patient is scheduled for 2nd cycle chemo today.

## 2019-01-09 NOTE — Patient Instructions (Signed)
Trastuzumab injection for infusion What is this medicine? TRASTUZUMAB (tras TOO zoo mab) is a monoclonal antibody. It is used to treat breast cancer and stomach cancer. This medicine may be used for other purposes; ask your health care provider or pharmacist if you have questions. COMMON BRAND NAME(S): Herceptin, KANJINTI, OGIVRI What should I tell my health care provider before I take this medicine? They need to know if you have any of these conditions: -heart disease -heart failure -lung or breathing disease, like asthma -an unusual or allergic reaction to trastuzumab, benzyl alcohol, or other medications, foods, dyes, or preservatives -pregnant or trying to get pregnant -breast-feeding How should I use this medicine? This drug is given as an infusion into a vein. It is administered in a hospital or clinic by a specially trained health care professional. Talk to your pediatrician regarding the use of this medicine in children. This medicine is not approved for use in children. Overdosage: If you think you have taken too much of this medicine contact a poison control center or emergency room at once. NOTE: This medicine is only for you. Do not share this medicine with others. What if I miss a dose? It is important not to miss a dose. Call your doctor or health care professional if you are unable to keep an appointment. What may interact with this medicine? This medicine may interact with the following medications: -certain types of chemotherapy, such as daunorubicin, doxorubicin, epirubicin, and idarubicin This list may not describe all possible interactions. Give your health care provider a list of all the medicines, herbs, non-prescription drugs, or dietary supplements you use. Also tell them if you smoke, drink alcohol, or use illegal drugs. Some items may interact with your medicine. What should I watch for while using this medicine? Visit your doctor for checks on your progress. Report  any side effects. Continue your course of treatment even though you feel ill unless your doctor tells you to stop. Call your doctor or health care professional for advice if you get a fever, chills or sore throat, or other symptoms of a cold or flu. Do not treat yourself. Try to avoid being around people who are sick. You may experience fever, chills and shaking during your first infusion. These effects are usually mild and can be treated with other medicines. Report any side effects during the infusion to your health care professional. Fever and chills usually do not happen with later infusions. Do not become pregnant while taking this medicine or for 7 months after stopping it. Women should inform their doctor if they wish to become pregnant or think they might be pregnant. Women of child-bearing potential will need to have a negative pregnancy test before starting this medicine. There is a potential for serious side effects to an unborn child. Talk to your health care professional or pharmacist for more information. Do not breast-feed an infant while taking this medicine or for 7 months after stopping it. Women must use effective birth control with this medicine. What side effects may I notice from receiving this medicine? Side effects that you should report to your doctor or health care professional as soon as possible: -allergic reactions like skin rash, itching or hives, swelling of the face, lips, or tongue -chest pain or palpitations -cough -dizziness -feeling faint or lightheaded, falls -fever -general ill feeling or flu-like symptoms -signs of worsening heart failure like breathing problems; swelling in your legs and feet -unusually weak or tired Side effects that usually do not   require medical attention (report to your doctor or health care professional if they continue or are bothersome): -bone pain -changes in taste -diarrhea -joint pain -nausea/vomiting -weight loss This list may  not describe all possible side effects. Call your doctor for medical advice about side effects. You may report side effects to FDA at 1-800-FDA-1088. Where should I keep my medicine? This drug is given in a hospital or clinic and will not be stored at home. NOTE: This sheet is a summary. It may not cover all possible information. If you have questions about this medicine, talk to your doctor, pharmacist, or health care provider.  2019 Elsevier/Gold Standard (2016-08-21 14:37:52)     Paclitaxel injection What is this medicine? PACLITAXEL (PAK li TAX el) is a chemotherapy drug. It targets fast dividing cells, like cancer cells, and causes these cells to die. This medicine is used to treat ovarian cancer, breast cancer, lung cancer, Kaposi's sarcoma, and other cancers. This medicine may be used for other purposes; ask your health care provider or pharmacist if you have questions. COMMON BRAND NAME(S): Onxol, Taxol What should I tell my health care provider before I take this medicine? They need to know if you have any of these conditions: -history of irregular heartbeat -liver disease -low blood counts, like low white cell, platelet, or red cell counts -lung or breathing disease, like asthma -tingling of the fingers or toes, or other nerve disorder -an unusual or allergic reaction to paclitaxel, alcohol, polyoxyethylated castor oil, other chemotherapy, other medicines, foods, dyes, or preservatives -pregnant or trying to get pregnant -breast-feeding How should I use this medicine? This drug is given as an infusion into a vein. It is administered in a hospital or clinic by a specially trained health care professional. Talk to your pediatrician regarding the use of this medicine in children. Special care may be needed. Overdosage: If you think you have taken too much of this medicine contact a poison control center or emergency room at once. NOTE: This medicine is only for you. Do not share  this medicine with others. What if I miss a dose? It is important not to miss your dose. Call your doctor or health care professional if you are unable to keep an appointment. What may interact with this medicine? Do not take this medicine with any of the following medications: -disulfiram -metronidazole This medicine may also interact with the following medications: -antiviral medicines for hepatitis, HIV or AIDS -certain antibiotics like erythromycin and clarithromycin -certain medicines for fungal infections like ketoconazole and itraconazole -certain medicines for seizures like carbamazepine, phenobarbital, phenytoin -gemfibrozil -nefazodone -rifampin -St. John's wort This list may not describe all possible interactions. Give your health care provider a list of all the medicines, herbs, non-prescription drugs, or dietary supplements you use. Also tell them if you smoke, drink alcohol, or use illegal drugs. Some items may interact with your medicine. What should I watch for while using this medicine? Your condition will be monitored carefully while you are receiving this medicine. You will need important blood work done while you are taking this medicine. This medicine can cause serious allergic reactions. To reduce your risk you will need to take other medicine(s) before treatment with this medicine. If you experience allergic reactions like skin rash, itching or hives, swelling of the face, lips, or tongue, tell your doctor or health care professional right away. In some cases, you may be given additional medicines to help with side effects. Follow all directions for their use. This  drug may make you feel generally unwell. This is not uncommon, as chemotherapy can affect healthy cells as well as cancer cells. Report any side effects. Continue your course of treatment even though you feel ill unless your doctor tells you to stop. Call your doctor or health care professional for advice if  you get a fever, chills or sore throat, or other symptoms of a cold or flu. Do not treat yourself. This drug decreases your body's ability to fight infections. Try to avoid being around people who are sick. This medicine may increase your risk to bruise or bleed. Call your doctor or health care professional if you notice any unusual bleeding. Be careful brushing and flossing your teeth or using a toothpick because you may get an infection or bleed more easily. If you have any dental work done, tell your dentist you are receiving this medicine. Avoid taking products that contain aspirin, acetaminophen, ibuprofen, naproxen, or ketoprofen unless instructed by your doctor. These medicines may hide a fever. Do not become pregnant while taking this medicine. Women should inform their doctor if they wish to become pregnant or think they might be pregnant. There is a potential for serious side effects to an unborn child. Talk to your health care professional or pharmacist for more information. Do not breast-feed an infant while taking this medicine. Men are advised not to father a child while receiving this medicine. This product may contain alcohol. Ask your pharmacist or healthcare provider if this medicine contains alcohol. Be sure to tell all healthcare providers you are taking this medicine. Certain medicines, like metronidazole and disulfiram, can cause an unpleasant reaction when taken with alcohol. The reaction includes flushing, headache, nausea, vomiting, sweating, and increased thirst. The reaction can last from 30 minutes to several hours. What side effects may I notice from receiving this medicine? Side effects that you should report to your doctor or health care professional as soon as possible: -allergic reactions like skin rash, itching or hives, swelling of the face, lips, or tongue -breathing problems -changes in vision -fast, irregular heartbeat -high or low blood pressure -mouth  sores -pain, tingling, numbness in the hands or feet -signs of decreased platelets or bleeding - bruising, pinpoint red spots on the skin, black, tarry stools, blood in the urine -signs of decreased red blood cells - unusually weak or tired, feeling faint or lightheaded, falls -signs of infection - fever or chills, cough, sore throat, pain or difficulty passing urine -signs and symptoms of liver injury like dark yellow or brown urine; general ill feeling or flu-like symptoms; light-colored stools; loss of appetite; nausea; right upper belly pain; unusually weak or tired; yellowing of the eyes or skin -swelling of the ankles, feet, hands -unusually slow heartbeat Side effects that usually do not require medical attention (report to your doctor or health care professional if they continue or are bothersome): -diarrhea -hair loss -loss of appetite -muscle or joint pain -nausea, vomiting -pain, redness, or irritation at site where injected -tiredness This list may not describe all possible side effects. Call your doctor for medical advice about side effects. You may report side effects to FDA at 1-800-FDA-1088. Where should I keep my medicine? This drug is given in a hospital or clinic and will not be stored at home. NOTE: This sheet is a summary. It may not cover all possible information. If you have questions about this medicine, talk to your doctor, pharmacist, or health care provider.  2019 Elsevier/Gold Standard (2017-04-30 13:14:55)

## 2019-01-12 ENCOUNTER — Telehealth: Payer: Self-pay | Admitting: Hematology

## 2019-01-12 ENCOUNTER — Telehealth: Payer: Self-pay | Admitting: *Deleted

## 2019-01-12 NOTE — Telephone Encounter (Signed)
No los per 5/1. °

## 2019-01-13 ENCOUNTER — Telehealth: Payer: Self-pay | Admitting: *Deleted

## 2019-01-13 ENCOUNTER — Encounter: Payer: Self-pay | Admitting: Hematology

## 2019-01-13 ENCOUNTER — Encounter: Payer: Self-pay | Admitting: Medical

## 2019-01-13 ENCOUNTER — Encounter: Payer: Self-pay | Admitting: Internal Medicine

## 2019-01-13 NOTE — Telephone Encounter (Signed)
I tried calling the patient. I left a message to discuss her concerns.

## 2019-01-13 NOTE — Telephone Encounter (Signed)
Email from patient:  I am having burning under my tongue and I am having EXTREME pain to the right side of my head which keeps me up at night in is only on the right side and is causes me quite a bit of concern. it is a sharp pain that hits different areas and as of last night along with other spots was my temple. This is not the first time this has happened which I originally dusted off as being a headache but this is different than a headache. I am also concerned about the pain under my tongue. I also have pain in my stomach from time to time as well as indigestion.     please advise of what I should do.

## 2019-01-13 NOTE — Progress Notes (Signed)
I have reviewed and agreed above plan. 

## 2019-01-14 ENCOUNTER — Telehealth: Payer: Self-pay | Admitting: Hematology

## 2019-01-14 ENCOUNTER — Other Ambulatory Visit: Payer: Self-pay | Admitting: Internal Medicine

## 2019-01-14 ENCOUNTER — Encounter: Payer: Self-pay | Admitting: Internal Medicine

## 2019-01-14 ENCOUNTER — Telehealth: Payer: Self-pay | Admitting: Cardiology

## 2019-01-14 ENCOUNTER — Ambulatory Visit (INDEPENDENT_AMBULATORY_CARE_PROVIDER_SITE_OTHER): Payer: Federal, State, Local not specified - PPO | Admitting: Internal Medicine

## 2019-01-14 DIAGNOSIS — R29818 Other symptoms and signs involving the nervous system: Secondary | ICD-10-CM

## 2019-01-14 DIAGNOSIS — G44091 Other trigeminal autonomic cephalgias (TAC), intractable: Secondary | ICD-10-CM

## 2019-01-14 DIAGNOSIS — R51 Headache: Secondary | ICD-10-CM

## 2019-01-14 DIAGNOSIS — R519 Headache, unspecified: Secondary | ICD-10-CM

## 2019-01-14 NOTE — Assessment & Plan Note (Signed)
We cannot increase gabapentin dosing and this is not helping with current pain regardless. Ordered MRI head and neck to rule out dissection, worsening trigeminal neuralgia, or spread of cancer although this possibility is less likely.

## 2019-01-14 NOTE — Telephone Encounter (Signed)
Unable to reach to set up doxy.me/consent/5.5.20 appt 01/16/19

## 2019-01-14 NOTE — Telephone Encounter (Signed)
Returned patient's phone call regarding rescheduling an appointment, left a voicemail. 

## 2019-01-14 NOTE — Progress Notes (Signed)
Virtual Visit via Video Note  I connected with Summer [name] on 01/14/19 at 10:00 AM EDT by a video enabled telemedicine application and verified that I am speaking with the correct person using two identifiers.  The patient and the provider were at separate locations throughout the entire encounter.   I discussed the limitations of evaluation and management by telemedicine and the availability of in person appointments. The patient expressed understanding and agreed to proceed.  History of Present Illness: The patient is a 68 y.o. female with visit for right sided face and tongue pain which is severe and unrelenting. Started about 2 days ago. Has trigeminal neuralgia but this is different from her typical symptoms of this. She takes gabapentin daily and this is not helping this pain. It is keeping her from sleeping. Pain is along the side of her face and in the tongue. She is currently undergoing chemo for breast cancer. Has called her neurologist but was unable to get any recommendation from them yet. Denies new numbness of weakness in her face or speech changes. Denies rash or swelling in her head or face. Overall it is worsening and severe. Has tried gabapentin and tylenol  Observations/Objective: Appearance: in pain, breathing appears normal, normal grooming, abdomen does not appear distended, throat normal, memory normal, mental status is A and O times 3, no facial drooping or swelling, speech is normal, no nerve deficit noted in face, unable to do strength or sensation testing given format of visit  Assessment and Plan: See problem oriented charting  Follow Up Instructions: ordered MRI head and neck to rule out cause of the sudden severe pain or recurrence or spread of trigeminal neuralgia  I discussed the assessment and treatment plan with the patient. The patient was provided an opportunity to ask questions and all were answered. The patient agreed with the plan and demonstrated an  understanding of the instructions.   The patient was advised to call back or seek an in-person evaluation if the symptoms worsen or if the condition fails to improve as anticipated.  Hoyt Koch, MD

## 2019-01-14 NOTE — Telephone Encounter (Signed)
I have tried to call the patient for the 2nd time to discuss her concerns. She did not answer. I left a message asking her to call the office back if she needs to discuss her symptoms.

## 2019-01-15 NOTE — Telephone Encounter (Signed)
Left message to call and confirm appointment

## 2019-01-16 ENCOUNTER — Inpatient Hospital Stay: Payer: Federal, State, Local not specified - PPO

## 2019-01-16 ENCOUNTER — Other Ambulatory Visit: Payer: Self-pay

## 2019-01-16 ENCOUNTER — Telehealth: Payer: Federal, State, Local not specified - PPO | Admitting: Cardiology

## 2019-01-16 ENCOUNTER — Inpatient Hospital Stay (HOSPITAL_BASED_OUTPATIENT_CLINIC_OR_DEPARTMENT_OTHER): Payer: Federal, State, Local not specified - PPO | Admitting: Medical

## 2019-01-16 ENCOUNTER — Encounter: Payer: Self-pay | Admitting: *Deleted

## 2019-01-16 VITALS — BP 137/48 | HR 66 | Temp 98.3°F | Resp 20

## 2019-01-16 DIAGNOSIS — C50411 Malignant neoplasm of upper-outer quadrant of right female breast: Secondary | ICD-10-CM | POA: Diagnosis not present

## 2019-01-16 DIAGNOSIS — L853 Xerosis cutis: Secondary | ICD-10-CM

## 2019-01-16 DIAGNOSIS — Z170 Estrogen receptor positive status [ER+]: Secondary | ICD-10-CM

## 2019-01-16 DIAGNOSIS — D509 Iron deficiency anemia, unspecified: Secondary | ICD-10-CM

## 2019-01-16 DIAGNOSIS — Z95828 Presence of other vascular implants and grafts: Secondary | ICD-10-CM

## 2019-01-16 DIAGNOSIS — J302 Other seasonal allergic rhinitis: Secondary | ICD-10-CM

## 2019-01-16 LAB — CBC WITH DIFFERENTIAL (CANCER CENTER ONLY)
Abs Immature Granulocytes: 0.02 10*3/uL (ref 0.00–0.07)
Basophils Absolute: 0 10*3/uL (ref 0.0–0.1)
Basophils Relative: 1 %
Eosinophils Absolute: 0.1 10*3/uL (ref 0.0–0.5)
Eosinophils Relative: 2 %
HCT: 35.1 % — ABNORMAL LOW (ref 36.0–46.0)
Hemoglobin: 10.6 g/dL — ABNORMAL LOW (ref 12.0–15.0)
Immature Granulocytes: 1 %
Lymphocytes Relative: 37 %
Lymphs Abs: 1.2 10*3/uL (ref 0.7–4.0)
MCH: 27.2 pg (ref 26.0–34.0)
MCHC: 30.2 g/dL (ref 30.0–36.0)
MCV: 90 fL (ref 80.0–100.0)
Monocytes Absolute: 0.2 10*3/uL (ref 0.1–1.0)
Monocytes Relative: 5 %
Neutro Abs: 1.7 10*3/uL (ref 1.7–7.7)
Neutrophils Relative %: 54 %
Platelet Count: 285 10*3/uL (ref 150–400)
RBC: 3.9 MIL/uL (ref 3.87–5.11)
RDW: 20.7 % — ABNORMAL HIGH (ref 11.5–15.5)
WBC Count: 3.2 10*3/uL — ABNORMAL LOW (ref 4.0–10.5)
nRBC: 0 % (ref 0.0–0.2)

## 2019-01-16 LAB — CMP (CANCER CENTER ONLY)
ALT: 22 U/L (ref 0–44)
AST: 19 U/L (ref 15–41)
Albumin: 3.4 g/dL — ABNORMAL LOW (ref 3.5–5.0)
Alkaline Phosphatase: 74 U/L (ref 38–126)
Anion gap: 6 (ref 5–15)
BUN: 10 mg/dL (ref 8–23)
CO2: 29 mmol/L (ref 22–32)
Calcium: 8.7 mg/dL — ABNORMAL LOW (ref 8.9–10.3)
Chloride: 107 mmol/L (ref 98–111)
Creatinine: 0.75 mg/dL (ref 0.44–1.00)
GFR, Est AFR Am: >60 mL/min (ref >60)
GFR, Estimated: >60 mL/min (ref >60)
Glucose, Bld: 97 mg/dL (ref 70–99)
Potassium: 4.1 mmol/L (ref 3.5–5.1)
Sodium: 142 mmol/L (ref 135–145)
Total Bilirubin: 0.3 mg/dL (ref 0.3–1.2)
Total Protein: 6.5 g/dL (ref 6.5–8.1)

## 2019-01-16 MED ORDER — FAMOTIDINE IN NACL 20-0.9 MG/50ML-% IV SOLN
20.0000 mg | Freq: Once | INTRAVENOUS | Status: AC
  Administered 2019-01-16: 20 mg via INTRAVENOUS

## 2019-01-16 MED ORDER — DEXAMETHASONE SODIUM PHOSPHATE 10 MG/ML IJ SOLN
10.0000 mg | Freq: Once | INTRAMUSCULAR | Status: AC
  Administered 2019-01-16: 10 mg via INTRAVENOUS

## 2019-01-16 MED ORDER — SODIUM CHLORIDE 0.9% FLUSH
10.0000 mL | INTRAVENOUS | Status: DC | PRN
  Administered 2019-01-16: 10 mL via INTRAVENOUS
  Filled 2019-01-16: qty 10

## 2019-01-16 MED ORDER — ACETAMINOPHEN 325 MG PO TABS
650.0000 mg | ORAL_TABLET | Freq: Once | ORAL | Status: AC
  Administered 2019-01-16: 650 mg via ORAL

## 2019-01-16 MED ORDER — HEPARIN SOD (PORK) LOCK FLUSH 100 UNIT/ML IV SOLN
500.0000 [IU] | Freq: Once | INTRAVENOUS | Status: AC | PRN
  Administered 2019-01-16: 500 [IU]
  Filled 2019-01-16: qty 5

## 2019-01-16 MED ORDER — TRASTUZUMAB CHEMO 150 MG IV SOLR
2.0000 mg/kg | Freq: Once | INTRAVENOUS | Status: AC
  Administered 2019-01-16: 168 mg via INTRAVENOUS
  Filled 2019-01-16: qty 8

## 2019-01-16 MED ORDER — DIPHENHYDRAMINE HCL 50 MG/ML IJ SOLN
50.0000 mg | Freq: Once | INTRAMUSCULAR | Status: AC
  Administered 2019-01-16: 50 mg via INTRAVENOUS

## 2019-01-16 MED ORDER — SODIUM CHLORIDE 0.9 % IV SOLN
Freq: Once | INTRAVENOUS | Status: AC
  Administered 2019-01-16: 12:00:00 via INTRAVENOUS
  Filled 2019-01-16: qty 250

## 2019-01-16 MED ORDER — DEXAMETHASONE SODIUM PHOSPHATE 10 MG/ML IJ SOLN
INTRAMUSCULAR | Status: AC
  Filled 2019-01-16: qty 1

## 2019-01-16 MED ORDER — SODIUM CHLORIDE 0.9% FLUSH
10.0000 mL | INTRAVENOUS | Status: DC | PRN
  Administered 2019-01-16: 10 mL
  Filled 2019-01-16: qty 10

## 2019-01-16 MED ORDER — SODIUM CHLORIDE 0.9 % IV SOLN
80.0000 mg/m2 | Freq: Once | INTRAVENOUS | Status: AC
  Administered 2019-01-16: 162 mg via INTRAVENOUS
  Filled 2019-01-16: qty 27

## 2019-01-16 MED ORDER — FAMOTIDINE IN NACL 20-0.9 MG/50ML-% IV SOLN
INTRAVENOUS | Status: AC
  Filled 2019-01-16: qty 50

## 2019-01-16 MED ORDER — DIPHENHYDRAMINE HCL 50 MG/ML IJ SOLN
INTRAMUSCULAR | Status: AC
  Filled 2019-01-16: qty 1

## 2019-01-16 MED ORDER — ACETAMINOPHEN 325 MG PO TABS
ORAL_TABLET | ORAL | Status: AC
  Filled 2019-01-16: qty 2

## 2019-01-16 NOTE — Patient Instructions (Signed)
[facility] Discharge Instructions for Patients Receiving Chemotherapy  Today you received the following chemotherapy agents Trastuzumab (HERCEPTIN) & Paclitaxel (TAXOL).  To help prevent nausea and vomiting after your treatment, we encourage you to take your nausea medication as prescribed.   If you develop nausea and vomiting that is not controlled by your nausea medication, call the clinic.   BELOW ARE SYMPTOMS THAT SHOULD BE REPORTED IMMEDIATELY:  *FEVER GREATER THAN 100.5 F  *CHILLS WITH OR WITHOUT FEVER  NAUSEA AND VOMITING THAT IS NOT CONTROLLED WITH YOUR NAUSEA MEDICATION  *UNUSUAL SHORTNESS OF BREATH  *UNUSUAL BRUISING OR BLEEDING  TENDERNESS IN MOUTH AND THROAT WITH OR WITHOUT PRESENCE OF ULCERS  *URINARY PROBLEMS  *BOWEL PROBLEMS  UNUSUAL RASH Items with * indicate a potential emergency and should be followed up as soon as possible.  Feel free to call the clinic should you have any questions or concerns. The clinic phone number is [phone].  Please show the [facility] at check-in to the Emergency Department and triage nurse.  Coronavirus (COVID-19) Are you at risk?  Are you at risk for the Coronavirus (COVID-19)?  To be considered HIGH RISK for Coronavirus (COVID-19), you have to meet the following criteria:  . Traveled to [place], [place], [place], [place] or [place]; or in the [place] to [place], [place], [place], or [place]; and have fever, cough, and shortness of breath within the last 2 weeks of travel OR . Been in close contact with a person diagnosed with COVID-19 within the last 2 weeks and have fever, cough, and shortness of breath . IF YOU DO NOT MEET THESE CRITERIA, YOU ARE CONSIDERED LOW RISK FOR COVID-19.  What to do if you are HIGH RISK for COVID-19?  Marland Kitchen If you are having a medical emergency, call 911. . Seek medical care right away. Before you go to a doctor's office, urgent care or emergency department,  call ahead and tell them about your recent travel, contact with someone diagnosed with COVID-19, and your symptoms. You should receive instructions from your physician's office regarding next steps of care.  . When you arrive at healthcare provider, tell the healthcare staff immediately you have returned from visiting [place], [place], [place], [place] or [place]; or traveled in the [place] to [place], [place], [place], or [place]; in the last two weeks or you have been in close contact with a person diagnosed with COVID-19 in the last 2 weeks.   . Tell the health care staff about your symptoms: fever, cough and shortness of breath. . After you have been seen by a medical provider, you will be either: o Tested for (COVID-19) and discharged home on quarantine except to seek medical care if symptoms worsen, and asked to  - Stay home and avoid contact with others until you get your results (4-5 days)  - Avoid travel on public transportation if possible (such as bus, train, or airplane) or o Sent to the Emergency Department by EMS for evaluation, COVID-19 testing, and possible admission depending on your condition and test results.  What to do if you are LOW RISK for COVID-19?  Reduce your risk of any infection by using the same precautions used for avoiding the common cold or flu:  Marland Kitchen Wash your hands often with soap and warm water for at least 20 seconds.  If soap and water are not readily available, use an alcohol-based hand sanitizer with at least 60% alcohol.  Marland Kitchen  If coughing or sneezing, cover your mouth and nose by coughing or sneezing into the elbow areas of your shirt or coat, into a tissue or into your sleeve (not your hands). . Avoid shaking hands with others and consider head nods or verbal greetings only. . Avoid touching your eyes, nose, or mouth with unwashed hands.  . Avoid close contact with people who are sick. . Avoid places or events with large numbers of people in one  location, like concerts or sporting events. . Carefully consider travel plans you have or are making. . If you are planning any travel outside or inside the [place], visit the CDC's Travelers' Health webpage for the latest health notices. . If you have some symptoms but not all symptoms, continue to monitor at home and seek medical attention if your symptoms worsen. . If you are having a medical emergency, call 911.   [place] / e-Visit: [email or web address]         [facility]: [facility]: [phone]                   [facility]: [phone] \

## 2019-01-16 NOTE — Progress Notes (Signed)
The patient was seen in the infusion room today.  She reports that she has had dry and itching hands.  She has been using Eucerin cream without benefit.  She also reports that she has had nasal congestion.  She asked if there is something that she can take over-the-counter for both of these.  She was given information on Benadryl lotion for her hands and was told to begin using Claritin daily as needed for her nasal congestion.  Sandi Mealy, MHS, PA-C Physician Assistant

## 2019-01-16 NOTE — Patient Instructions (Signed)

## 2019-01-22 NOTE — Progress Notes (Signed)
[place]   Telephone:(336) [phone] Fax:(336) [phone]   Clinic Follow up Note   Patient Care Team: Hoyt Koch, MD as PCP - General (Internal Medicine) Mauro Kaufmann, RN as Oncology Nurse Navigator Rockwell Germany, RN as Oncology Nurse Navigator Excell Seltzer, MD as Consulting Physician (General Surgery) Truitt Merle, MD as Consulting Physician (Hematology) Gery Pray, MD as Consulting Physician (Radiation Oncology)  Date of Service:  01/23/2019  CHIEF COMPLAINT:  F/u in right breast cancer  SUMMARY OF ONCOLOGIC HISTORY: Oncology History   Cancer Staging Malignant neoplasm of upper-outer quadrant of right breast in female, estrogen receptor positive ([place]) Staging form: Breast, AJCC 8th Edition - Clinical stage from 11/10/2018: Stage IA (cT1b, cN0, cM0, G2, ER+, PR+, HER2+) - Signed by Truitt Merle, MD on 11/18/2018       Malignant neoplasm of upper-outer quadrant of right breast in female, estrogen receptor positive ([place])   11/07/2018 Mammogram    Diagnostic 11/07/18 IMPRESSION: 1. 1 x 0.7 x 0.6 cm hypoechoic mass with distortion at the 9-9:30 position of the RIGHT breast 6 cm from the nipple with distortion in the UPPER-OUTER RIGHT breast, corresponding to the mammographic abnormality. One RIGHT axillary lymph node with borderline cortical thickness. Tissue sampling of the RIGHT breast mass and borderline RIGHT axillary lymph node recommended. ADDENDUM: Patient returned today for biopsy of a single borderline thickened lymph node in the RIGHT axilla. I could not reproduce a morphologically abnormal lymph node today, possibly resolved in the interval. Today, only normal appearing lymph nodes were identified in the RIGHT axilla. As such, the axillary ultrasound-guided biopsy was canceled.    11/10/2018 Cancer Staging    Staging form: Breast, AJCC 8th Edition - Clinical stage from 11/10/2018: Stage IA (cT1b, cN0, cM0, G2, ER+, PR+, HER2+) - Signed  by Truitt Merle, MD on 11/18/2018    11/11/2018 Initial Biopsy    Diagnosis 11/11/18 Breast, right, needle core biopsy, upper outer - 9:30 o'clock position - INVASIVE DUCTAL CARCINOMA, GRADE II/III. - SEE MICROSCOPIC DESCRIPTION.    11/11/2018 Receptors her2    Results: IMMUNOHISTOCHEMICAL AND MORPHOMETRIC ANALYSIS PERFORMED MANUALLY The tumor cells are POSITIVE for Her2 (3+). Estrogen Receptor: 95%, POSITIVE, STRONG STAINING INTENSITY Progesterone Receptor: 5%, POSITIVE, STRONG STAINING INTENSITY Proliferation Marker Ki67: 20%    11/17/2018 Initial Diagnosis    Malignant neoplasm of upper-outer quadrant of right breast in female, estrogen receptor positive ([place])    11/28/2018 Surgery    RIGHT BREAST LUMPECTOMY WITH RADIOACTIVE SEED AND RIGHT AXILLARY SENTINEL LYMPH NODE BIOPSY by Dr. Excell Seltzer  11/28/18     11/28/2018 Pathology Results    Diagnosis 11/28/18  1. Breast, lumpectomy, Right w/seed - INVASIVE DUCTAL CARCINOMA, 1.6 CM, [place]TINGHAM GRADE 2 OF 3. - MARGINS OF RESECTION ARE NOT INVOLVED (CLOSEST MARGIN: LESS THAN 1 MM, ANTERIOR). - DUCTAL CARCINOMA IN SITU. - BIOPSY SITE CHANGES. - SEE ONCOLOGY TABLE. 2. Lymph node, sentinel, biopsy, right Axillary - ONE LYMPH NODE, NEGATIVE FOR CARCINOMA (0/1). 3. Lymph node, sentinel, biopsy, right Axillary - ONE LYMPH NODE, NEGATIVE FOR CARCINOMA (0/1).    11/28/2018 Cancer Staging    Staging form: Breast, AJCC 8th Edition - Pathologic stage from 11/28/2018: Stage IA (pT1c, pN0, cM0, G2, ER+, PR+, HER2+) - Signed by Truitt Merle, MD on 12/11/2018    12/22/2018 Procedure    Baseline ECHO 12/22/18  IMPRESSIONS  1. The left ventricle has normal systolic function with an ejection fraction of 60-65%. The cavity size was normal. There is mild concentric  left ventricular hypertrophy. Left ventricular diastolic parameters were normal.  2. The right ventricle has normal systolic function. The cavity was normal. There is no increase in right ventricular wall  thickness.  3. Left atrial size was moderately dilated.  4. The aortic valve is tricuspid. Aortic valve regurgitation was not assessed by color flow Doppler.     01/02/2019 -  Chemotherapy    adjuvant chemo weekly Taxol with Herceptin q3weeks starting 01/02/19       CURRENT THERAPY:  -adjuvant chemo weekly Taxol with Herceptin q3weeks starting 01/02/19 -IV iron as needed  INTERVAL HISTORY:  Summer [name] is here for a follow up and treatment. She presents to the clinic today by herself. She notes she has been having nose bleeds this week.. She had pink discharge for 20 minutes or less when she wake up in the morning and blow her nose. She notes her skin is darker of her hands and her hair is starting to fall out. She notes having right sided Has for the last month. This effects her sleep. This is different from her neuralgia.  She notes having right breast pain which last about 10 minutes which is mostly shooting pain. She notes left toes tingling. She uses ice at night, but not during infusion. She feels her left LE will swelling occasionally.    REVIEW OF SYSTEMS:   Constitutional: Denies fevers, chills or abnormal weight loss (+) Intermittent diffuse right sided HAs Eyes: Denies blurriness of vision Ears, nose, mouth, throat, and face: Denies mucositis or sore throat (+) Mild bleeding in nasal discharge  Respiratory: Denies cough, dyspnea or wheezes Cardiovascular: Denies palpitation, chest discomfort (+) Occasional Left lower extremity swelling Gastrointestinal:  Denies nausea, heartburn or change in bowel habits Skin: Denies abnormal skin rashes (+) Skin darkening of hands (+) Hair loss  Lymphatics: Denies new lymphadenopathy or easy bruising Neurological: (+) tingling of left toes Behavioral/Psych: Mood is stable, no new changes  Breast: (+) right breast shooting pain  All other systems were reviewed with the patient and are negative.  MEDICAL HISTORY:  Past Medical  History:  Diagnosis Date  . Abdominal pain, left lower quadrant 11/09/2009   Qualifier: Diagnosis of  By: Valma Cava LPN, Izora Gala    . Allergic rhinitis 08/26/2008   Qualifier: Diagnosis of  By: Sherren Mocha MD, Jory Ee   . Anemia, iron deficiency 11/21/2018  . Arthritis shoulders and back  . BACK PAIN, CHRONIC 06/13/2007   Qualifier: Diagnosis of  By: Sherren Mocha MD, Jory Ee   . 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 Constipation 07/01/2018  . Costochondritis 05/04/2014  . Diverticulitis of colon 06/13/2007   S/p segmental colectomy for perforated diverticulitis   . Diverticulosis   . Facial pain 10/06/2015  . Fibromyalgia   . Foot sprain, left, initial encounter 08/31/2016  . Glossitis 02/02/2016  . Headache 04/24/2016  . HEMATURIA UNSPECIFIED 06/25/2008   Qualifier: Diagnosis of  By: Sherren Mocha MD, Jory Ee   . HEMATURIA, HX OF 11/14/2009   Qualifier: Diagnosis of  By: Regino Schultze CMA (AAMA), Apolonio Schneiders    . History of kidney stones   . History of kidney stones   . History of shingles 2012--  no residual pain  . Itching 10/13/2018  . Left eye pain 07/01/2018  . Left ureteral calculus   . LLQ pain 04/24/2016  . LOC OSTEOARTHROS NOT SPEC PRIM/SEC OTH [facility] SITE 06/27/2009   Qualifier: Diagnosis of  By: Sherren Mocha  MD, Jory Ee   . Malignant neoplasm of upper-outer quadrant of right breast in female, estrogen receptor positive ([place]) 11/17/2018  . MCI (mild cognitive impairment)   . Memory loss 12/21/2016  . Morbid obesity ([place]) 11/09/2010  . Pain of right side of body 08/31/2016  . Pruritus 10/26/2016  . Right sided temporal headache 12/21/2016  . Tremor 12/21/2016  . Trigeminal neuralgia RIGHT  . Trigeminal neuralgia of right side of face 11/30/2010  . Trochanteric bursitis, right hip 10/26/2016  . Urgency of urination   . Weight loss 07/22/2017  . Zoster without complications 0/78/6754    SURGICAL HISTORY: Past Surgical History:  Procedure Laterality Date  . BREAST  LUMPECTOMY WITH RADIOACTIVE SEED AND SENTINEL LYMPH NODE BIOPSY Right 11/28/2018   Procedure: RIGHT BREAST LUMPECTOMY WITH RADIOACTIVE SEED AND RIGHT AXILLARY SENTINEL LYMPH NODE BIOPSY;  Surgeon: Excell Seltzer, MD;  Location: [facility];  Service: General;  Laterality: Right;  . CEREBRAL MICROVASCULAR DECOMPRESSION  07-05-2006   RIGHT TRIGEMINAL NERVE  . CYSTOSCOPY/RETROGRADE/URETEROSCOPY/STONE EXTRACTION WITH BASKET  07/04/2012   Procedure: CYSTOSCOPY/RETROGRADE/URETEROSCOPY/STONE EXTRACTION WITH BASKET;  Surgeon: Claybon Jabs, MD;  Location: [facility];  Service: Urology;  Laterality: Left;  . EXPLORATORY LAPAROTOMY/ RESECTION MID TO DISTAL SIGMOID AND PROXIMAL RECTUM/ END PROXIMAL SIGMOID COLOSTOMY  07-17-2006   PERFORATED DIVERTICULITIS WITH PERITONITIS  . gamma knife  05/17/2016   for trigeminal neuralgia, [facility], Dr Salomon Fick  . gamma knife  2019  . PORTACATH PLACEMENT Left 11/28/2018   Procedure: INSERTION PORT-A-CATH WITH ULTRASOUND;  Surgeon: Excell Seltzer, MD;  Location: [facility];  Service: General;  Laterality: Left;  . RESECTION COLOSTOMY/ CLOSURE COLOSTOMY WITH COLOPROCTOSTOMY  02-20-2007  . TONSILLECTOMY  AS CHILD  . URETEROSCOPY  07/04/2012   Procedure: URETEROSCOPY;  Surgeon: Claybon Jabs, MD;  Location: [facility];  Service: Urology;  Laterality: Left;  Marland Kitchen VAGINAL HYSTERECTOMY  1998   Partial  . WISDOM TOOTH EXTRACTION      I have reviewed the social history and family history with the patient and they are unchanged from previous note.  ALLERGIES:  is allergic to clindamycin hcl.  MEDICATIONS:  Current Outpatient Medications  Medication Sig Dispense Refill  . acetaminophen (TYLENOL) 500 MG tablet Take 1,000 mg by mouth every 6 (six) hours as needed (pain).     Marland Kitchen gabapentin (NEURONTIN) 600 MG tablet TAKE 2 TABLETS TID. MAX OF SIX TABS PER DAY. (Patient taking differently: Take 1,200 mg by mouth 3 (three) times daily. TAKE 2 TABLETS  TID. MAX OF SIX TABS PER DAY.) 540 tablet 3  . LamoTRIgine 200 MG TB24 24 hour tablet Take 1 tablet (200 mg total) by mouth at bedtime. (Patient taking differently: Take 100 mg by mouth at bedtime. ) 30 tablet 11  . lidocaine-prilocaine (EMLA) cream Apply to affected area once 30 g 3  . methocarbamol (ROBAXIN) 500 MG tablet Take 1 tablet (500 mg total) by mouth at bedtime as needed for muscle spasms. 30 tablet 0  . ondansetron (ZOFRAN) 8 MG tablet Take 1 tablet (8 mg total) by mouth 2 (two) times daily as needed (Nausea or vomiting). 30 tablet 1  . oxyCODONE (OXY IR/ROXICODONE) 5 MG immediate release tablet Take 1 tablet (5 mg total) by mouth every 6 (six) hours as needed for moderate pain, severe pain or breakthrough pain. 10 tablet 0  . prochlorperazine (COMPAZINE) 10 MG tablet Take 1 tablet (10 mg total) by mouth every 6 (six) hours as needed (Nausea or vomiting).  30 tablet 1   No current facility-administered medications for this visit.    Facility-Administered Medications Ordered in Other Visits  Medication Dose Route Frequency Provider Last Rate Last Dose  . sodium chloride flush (NS) 0.9 % injection 10 mL  10 mL Intracatheter PRN Truitt Merle, MD   10 mL at 01/23/19 1346    PHYSICAL EXAMINATION: ECOG PERFORMANCE STATUS: 1 - Symptomatic but completely ambulatory  Vitals:   01/23/19 0945  BP: (!) 143/62  Pulse: 82  Resp: 17  Temp: 98.9 F (37.2 C)  SpO2: 100%   Filed Weights   01/23/19 0945  Weight: 191 lb 4.8 oz (86.8 kg)    GENERAL:alert, no distress and comfortable SKIN: skin color, texture, turgor are normal, no rashes or significant lesions EYES: normal, Conjunctiva are pink and non-injected, sclera clear OROPHARYNX:no exudate, no erythema and lips, buccal mucosa, and tongue normal  NECK: supple, thyroid normal size, non-tender, without nodularity LYMPH:  no palpable lymphadenopathy in the cervical, axillary or inguinal LUNGS: clear to auscultation and percussion with  normal breathing effort HEART: regular rate & rhythm and no murmurs and no lower extremity edema ABDOMEN:abdomen soft, non-tender and normal bowel sounds Musculoskeletal:no cyanosis of digits and no clubbing  NEURO: alert & oriented x 3 with fluent speech, no focal motor/sensory deficits  LABORATORY DATA:  I have reviewed the data as listed CBC Latest Ref Rng & Units 01/23/2019 01/16/2019 01/09/2019  WBC 4.0 - 10.5 K/uL 3.1(L) 3.2(L) 4.7  Hemoglobin 12.0 - 15.0 g/dL 10.6(L) 10.6(L) 10.6(L)  Hematocrit 36.0 - 46.0 % 35.2(L) 35.1(L) 34.9(L)  Platelets 150 - 400 K/uL 301 285 196     CMP Latest Ref Rng & Units 01/23/2019 01/16/2019 01/09/2019  Glucose 70 - 99 mg/dL 142(H) 97 119(H)  BUN 8 - 23 mg/dL _0 Creatinine 0.44 - 1.00 mg/dL 0.79 0.75 0.75  Sodium 135 - 145 mmol/L 140 142 145  Potassium 3.5 - 5.1 mmol/L 4.1 4.1 3.7  Chloride 98 - 111 mmol/L 105 107 109  CO2 22 - 32 mmol/L _1 Calcium 8.9 - 10.3 mg/dL 8.7(L) 8.7(L) 8.6(L)  Total Protein 6.5 - 8.1 g/dL 6.2(L) 6.5 6.5  Total Bilirubin 0.3 - 1.2 mg/dL 0.3 0.3 0.3  Alkaline Phos 38 - 126 U/L 74 74 74  AST 15 - 41 U/L _2 ALT 0 - 44 U/L _3 RADIOGRAPHIC STUDIES: I have personally reviewed the radiological images as listed and agreed with the findings in the report. No results found.   ASSESSMENT & PLAN:  Zaide Mcclenahan is a 68 y.o. female with   1.Malignant neoplasm of upper-outer quadrant of right breast, StageIA, cT1b,N0,M0), ER/PR+, HER2+, GradeII -She was recently diagnosed in 11/2018. She is s/p right breastlumpectomyand SLNB as of 11/28/18.  -We reviewed anddiscussedthe pathology results which showed her small grade II tumor was completely resected with negative margins and node negative. -due to her early stage disease, I do not think she needs staging scans -I discussed her HER2 positive grade II disease is more aggressive increases her risk of recurrence. I recommend adjuvant chemo  to reduce her risk of recurrence before proceed ing with adjuvant radiation to reduce her risk of local recurrence and anti-estrogen therapy for 5-7 years to reduce her risk of distant recurrence.  -She started adjuvant weekly Taxol with Herceptinon 01/02/19. She tolerated first cycle poorly with bone/muscle pain and headaches. She saw PA Eliseo Squires who gave her  Robaxin. This has improved.  -She continues to have headaches which indicates that chemo is exacerbating her neuralgia. She also has experience skin darkening, hair loss and mild blood and nasal discharge. Will monitor her swelling for any indications of blood clots as chemo can increase her risk.  -I encouraged her to use ice bags for her hands and feet during chemo to decrease risk of neuropathy  -Labs revewied, CBC WNL except WBC 3.1, Hg 10.6, ANC 1.6, CMP unremarkable. Overall adequate to proceed with Taxol and Herceptin -F/u in 2 week   2. Anemia,iron deficiency -Pt notes she has been anemic in the past year -Last colonoscopy was in 04/2012 which wasunremarkable except diverticulosis.  -She had partial colectomy in the past due to severe diverticulosis. -This is possiblyiron deficiency from diverticulosis.She denies any current bleeding.  -After COVID-19 improvesand her chemo treatmentI will refer herbacktoDr. Gessnerfor colonoscopy -Her workup showed iron 17, ferritin 4, normal retic ct, folate Vitamin B12 and SPEP panels. -IV iron was given 3/18/20and 12/12/18.  -Hg at 10.6 (01/23/19), stable  -will monitor her iron level every month   3.Trigeminal neuralgia  -on meds -She has been having moderate right side Has which I suspect is exacerbation of her neuralgia secondary to chemo. Will monitor.  -She has oxycodone which she can use for significant pain.   4. Neuropathy, Muscle pain  -Secondary to chemo Taxol -I discussed neuropathy can progress with more treatment. Will monitor  -PA Eliseo Squires previously started her on  Robaxin. She can take Tylenol in between for breakthrough pain.  -Muscle pain overall resolved. Her neuropathy persists, mainly in her left toes. I again encouraged her to use ice bags during infusions.     PLAN: -Labs reviewed and adequate to proceed with Taxol and Herceptin today and continue weekly  -f/u in 2 weeks     No problem-specific Assessment & Plan notes found for this encounter.   Orders Placed This Encounter  Procedures  . Ferritin    Standing Status:   Standing    Number of Occurrences:   30    Standing Expiration Date:   01/23/2024  . Iron and TIBC    Standing Status:   Standing    Number of Occurrences:   20    Standing Expiration Date:   01/23/2024   All questions were answered. The patient knows to call the clinic with any problems, questions or concerns. No barriers to learning was detected. I spent 20 minutes counseling the patient face to face. The total time spent in the appointment was 25 minutes and more than 50% was on counseling and review of test results     Truitt Merle, MD 01/23/2019   I, Joslyn Devon, am acting as scribe for Truitt Merle, MD.   I have reviewed the above documentation for accuracy and completeness, and I agree with the above.

## 2019-01-23 ENCOUNTER — Other Ambulatory Visit: Payer: Self-pay

## 2019-01-23 ENCOUNTER — Inpatient Hospital Stay (HOSPITAL_BASED_OUTPATIENT_CLINIC_OR_DEPARTMENT_OTHER): Payer: Federal, State, Local not specified - PPO | Admitting: Medical

## 2019-01-23 ENCOUNTER — Telehealth: Payer: Self-pay | Admitting: Hematology

## 2019-01-23 ENCOUNTER — Inpatient Hospital Stay: Payer: Federal, State, Local not specified - PPO

## 2019-01-23 ENCOUNTER — Encounter: Payer: Self-pay | Admitting: Hematology

## 2019-01-23 ENCOUNTER — Inpatient Hospital Stay (HOSPITAL_BASED_OUTPATIENT_CLINIC_OR_DEPARTMENT_OTHER): Payer: Federal, State, Local not specified - PPO | Admitting: Hematology

## 2019-01-23 ENCOUNTER — Encounter: Payer: Self-pay | Admitting: *Deleted

## 2019-01-23 VITALS — BP 143/62 | HR 82 | Temp 98.9°F | Resp 17 | Ht 66.0 in | Wt 191.3 lb

## 2019-01-23 DIAGNOSIS — Z87442 Personal history of urinary calculi: Secondary | ICD-10-CM

## 2019-01-23 DIAGNOSIS — C50411 Malignant neoplasm of upper-outer quadrant of right female breast: Secondary | ICD-10-CM

## 2019-01-23 DIAGNOSIS — D509 Iron deficiency anemia, unspecified: Secondary | ICD-10-CM | POA: Diagnosis not present

## 2019-01-23 DIAGNOSIS — Z79899 Other long term (current) drug therapy: Secondary | ICD-10-CM

## 2019-01-23 DIAGNOSIS — G500 Trigeminal neuralgia: Secondary | ICD-10-CM

## 2019-01-23 DIAGNOSIS — M1990 Unspecified osteoarthritis, unspecified site: Secondary | ICD-10-CM

## 2019-01-23 DIAGNOSIS — Z95828 Presence of other vascular implants and grafts: Secondary | ICD-10-CM

## 2019-01-23 DIAGNOSIS — M791 Myalgia, unspecified site: Secondary | ICD-10-CM

## 2019-01-23 DIAGNOSIS — T451X5S Adverse effect of antineoplastic and immunosuppressive drugs, sequela: Secondary | ICD-10-CM

## 2019-01-23 DIAGNOSIS — M797 Fibromyalgia: Secondary | ICD-10-CM

## 2019-01-23 DIAGNOSIS — R040 Epistaxis: Secondary | ICD-10-CM

## 2019-01-23 DIAGNOSIS — Z170 Estrogen receptor positive status [ER+]: Secondary | ICD-10-CM

## 2019-01-23 DIAGNOSIS — L299 Pruritus, unspecified: Secondary | ICD-10-CM

## 2019-01-23 DIAGNOSIS — G620 Drug-induced polyneuropathy: Secondary | ICD-10-CM

## 2019-01-23 DIAGNOSIS — G893 Neoplasm related pain (acute) (chronic): Secondary | ICD-10-CM

## 2019-01-23 DIAGNOSIS — I351 Nonrheumatic aortic (valve) insufficiency: Secondary | ICD-10-CM

## 2019-01-23 LAB — CBC WITH DIFFERENTIAL (CANCER CENTER ONLY)
Abs Immature Granulocytes: 0.04 10*3/uL (ref 0.00–0.07)
Basophils Absolute: 0.1 10*3/uL (ref 0.0–0.1)
Basophils Relative: 2 %
Eosinophils Absolute: 0.1 10*3/uL (ref 0.0–0.5)
Eosinophils Relative: 2 %
HCT: 35.2 % — ABNORMAL LOW (ref 36.0–46.0)
Hemoglobin: 10.6 g/dL — ABNORMAL LOW (ref 12.0–15.0)
Immature Granulocytes: 1 %
Lymphocytes Relative: 39 %
Lymphs Abs: 1.2 10*3/uL (ref 0.7–4.0)
MCH: 27.5 pg (ref 26.0–34.0)
MCHC: 30.1 g/dL (ref 30.0–36.0)
MCV: 91.4 fL (ref 80.0–100.0)
Monocytes Absolute: 0.2 10*3/uL (ref 0.1–1.0)
Monocytes Relative: 5 %
Neutro Abs: 1.6 10*3/uL — ABNORMAL LOW (ref 1.7–7.7)
Neutrophils Relative %: 51 %
Platelet Count: 301 10*3/uL (ref 150–400)
RBC: 3.85 MIL/uL — ABNORMAL LOW (ref 3.87–5.11)
RDW: 20.2 % — ABNORMAL HIGH (ref 11.5–15.5)
WBC Count: 3.1 10*3/uL — ABNORMAL LOW (ref 4.0–10.5)
nRBC: 0 % (ref 0.0–0.2)

## 2019-01-23 LAB — CMP (CANCER CENTER ONLY)
ALT: 18 U/L (ref 0–44)
AST: 16 U/L (ref 15–41)
Albumin: 3.4 g/dL — ABNORMAL LOW (ref 3.5–5.0)
Alkaline Phosphatase: 74 U/L (ref 38–126)
Anion gap: 7 (ref 5–15)
BUN: 11 mg/dL (ref 8–23)
CO2: 28 mmol/L (ref 22–32)
Calcium: 8.7 mg/dL — ABNORMAL LOW (ref 8.9–10.3)
Chloride: 105 mmol/L (ref 98–111)
Creatinine: 0.79 mg/dL (ref 0.44–1.00)
GFR, Est AFR Am: >60 mL/min (ref >60)
GFR, Estimated: >60 mL/min (ref >60)
Glucose, Bld: 142 mg/dL — ABNORMAL HIGH (ref 70–99)
Potassium: 4.1 mmol/L (ref 3.5–5.1)
Sodium: 140 mmol/L (ref 135–145)
Total Bilirubin: 0.3 mg/dL (ref 0.3–1.2)
Total Protein: 6.2 g/dL — ABNORMAL LOW (ref 6.5–8.1)

## 2019-01-23 MED ORDER — SODIUM CHLORIDE 0.9% FLUSH
3.0000 mL | Freq: Once | INTRAVENOUS | Status: AC | PRN
  Administered 2019-01-23: 09:00:00 3 mL
  Filled 2019-01-23: qty 10

## 2019-01-23 MED ORDER — ACETAMINOPHEN 325 MG PO TABS
ORAL_TABLET | ORAL | Status: AC
  Filled 2019-01-23: qty 2

## 2019-01-23 MED ORDER — FAMOTIDINE IN NACL 20-0.9 MG/50ML-% IV SOLN
INTRAVENOUS | Status: AC
  Filled 2019-01-23: qty 50

## 2019-01-23 MED ORDER — FAMOTIDINE IN NACL 20-0.9 MG/50ML-% IV SOLN
20.0000 mg | Freq: Once | INTRAVENOUS | Status: AC
  Administered 2019-01-23: 20 mg via INTRAVENOUS

## 2019-01-23 MED ORDER — DEXAMETHASONE SODIUM PHOSPHATE 10 MG/ML IJ SOLN
INTRAMUSCULAR | Status: AC
  Filled 2019-01-23: qty 1

## 2019-01-23 MED ORDER — DEXAMETHASONE SODIUM PHOSPHATE 10 MG/ML IJ SOLN
10.0000 mg | Freq: Once | INTRAMUSCULAR | Status: AC
  Administered 2019-01-23: 10 mg via INTRAVENOUS

## 2019-01-23 MED ORDER — DIPHENHYDRAMINE HCL 50 MG/ML IJ SOLN
50.0000 mg | Freq: Once | INTRAMUSCULAR | Status: AC
  Administered 2019-01-23: 50 mg via INTRAVENOUS

## 2019-01-23 MED ORDER — SODIUM CHLORIDE 0.9% FLUSH
10.0000 mL | INTRAVENOUS | Status: DC | PRN
  Administered 2019-01-23: 10 mL
  Filled 2019-01-23: qty 10

## 2019-01-23 MED ORDER — ACETAMINOPHEN 325 MG PO TABS
650.0000 mg | ORAL_TABLET | Freq: Once | ORAL | Status: AC
  Administered 2019-01-23: 650 mg via ORAL

## 2019-01-23 MED ORDER — DIPHENHYDRAMINE HCL 50 MG/ML IJ SOLN
INTRAMUSCULAR | Status: AC
  Filled 2019-01-23: qty 1

## 2019-01-23 MED ORDER — HEPARIN SOD (PORK) LOCK FLUSH 100 UNIT/ML IV SOLN
500.0000 [IU] | Freq: Once | INTRAVENOUS | Status: AC | PRN
  Administered 2019-01-23: 500 [IU]
  Filled 2019-01-23: qty 5

## 2019-01-23 MED ORDER — TRASTUZUMAB CHEMO 150 MG IV SOLR
2.0000 mg/kg | Freq: Once | INTRAVENOUS | Status: AC
  Administered 2019-01-23: 168 mg via INTRAVENOUS
  Filled 2019-01-23: qty 8

## 2019-01-23 MED ORDER — SODIUM CHLORIDE 0.9 % IV SOLN
Freq: Once | INTRAVENOUS | Status: AC
  Administered 2019-01-23: 11:00:00 via INTRAVENOUS
  Filled 2019-01-23: qty 250

## 2019-01-23 MED ORDER — SODIUM CHLORIDE 0.9 % IV SOLN
80.0000 mg/m2 | Freq: Once | INTRAVENOUS | Status: AC
  Administered 2019-01-23: 13:00:00 162 mg via INTRAVENOUS
  Filled 2019-01-23: qty 27

## 2019-01-23 NOTE — Progress Notes (Signed)
The patient was seen in the infusion room today as she was receiving chemotherapy.  She reported that she was having standing at her port site.  The patient's port was assessed.  The patient demonstrated good blood return.  Her IV was infusing without difficulty.  The patient's IV site did not show any evidence of extravasation.  The patient was reassured and her treatment was continued and completed.  Sandi Mealy, MHS, PA-C Physician Assistant

## 2019-01-23 NOTE — Progress Notes (Signed)
Ice bags were delivered to pt during Taxol administration. Pt. Did not seem interested in ice, nor did she maintain direct contact with ice during tx. Education given.

## 2019-01-23 NOTE — Patient Instructions (Signed)
[facility] Discharge Instructions for Patients Receiving Chemotherapy  Today you received the following chemotherapy agents Trastuzumab (HERCEPTIN) & Paclitaxel (TAXOL).  To help prevent nausea and vomiting after your treatment, we encourage you to take your nausea medication as prescribed.   If you develop nausea and vomiting that is not controlled by your nausea medication, call the clinic.   BELOW ARE SYMPTOMS THAT SHOULD BE REPORTED IMMEDIATELY:  *FEVER GREATER THAN 100.5 F  *CHILLS WITH OR WITHOUT FEVER  NAUSEA AND VOMITING THAT IS NOT CONTROLLED WITH YOUR NAUSEA MEDICATION  *UNUSUAL SHORTNESS OF BREATH  *UNUSUAL BRUISING OR BLEEDING  TENDERNESS IN MOUTH AND THROAT WITH OR WITHOUT PRESENCE OF ULCERS  *URINARY PROBLEMS  *BOWEL PROBLEMS  UNUSUAL RASH Items with * indicate a potential emergency and should be followed up as soon as possible.  Feel free to call the clinic should you have any questions or concerns. The clinic phone number is [phone].  Please show the [facility] at check-in to the Emergency Department and triage nurse.  Coronavirus (COVID-19) Are you at risk?  Are you at risk for the Coronavirus (COVID-19)?  To be considered HIGH RISK for Coronavirus (COVID-19), you have to meet the following criteria:  . Traveled to [place], [place], [place], [place] or [place]; or in the [place] to [place], [place], [place], or [place]; and have fever, cough, and shortness of breath within the last 2 weeks of travel OR . Been in close contact with a person diagnosed with COVID-19 within the last 2 weeks and have fever, cough, and shortness of breath . IF YOU DO NOT MEET THESE CRITERIA, YOU ARE CONSIDERED LOW RISK FOR COVID-19.  What to do if you are HIGH RISK for COVID-19?  Marland Kitchen If you are having a medical emergency, call 911. . Seek medical care right away. Before you go to a doctor's office, urgent care or emergency department,  call ahead and tell them about your recent travel, contact with someone diagnosed with COVID-19, and your symptoms. You should receive instructions from your physician's office regarding next steps of care.  . When you arrive at healthcare provider, tell the healthcare staff immediately you have returned from visiting [place], [place], [place], [place] or [place]; or traveled in the [place] to [place], [place], [place], or [place]; in the last two weeks or you have been in close contact with a person diagnosed with COVID-19 in the last 2 weeks.   . Tell the health care staff about your symptoms: fever, cough and shortness of breath. . After you have been seen by a medical provider, you will be either: o Tested for (COVID-19) and discharged home on quarantine except to seek medical care if symptoms worsen, and asked to  - Stay home and avoid contact with others until you get your results (4-5 days)  - Avoid travel on public transportation if possible (such as bus, train, or airplane) or o Sent to the Emergency Department by EMS for evaluation, COVID-19 testing, and possible admission depending on your condition and test results.  What to do if you are LOW RISK for COVID-19?  Reduce your risk of any infection by using the same precautions used for avoiding the common cold or flu:  Marland Kitchen Wash your hands often with soap and warm water for at least 20 seconds.  If soap and water are not readily available, use an alcohol-based hand sanitizer with at least 60% alcohol.  Marland Kitchen  If coughing or sneezing, cover your mouth and nose by coughing or sneezing into the elbow areas of your shirt or coat, into a tissue or into your sleeve (not your hands). . Avoid shaking hands with others and consider head nods or verbal greetings only. . Avoid touching your eyes, nose, or mouth with unwashed hands.  . Avoid close contact with people who are sick. . Avoid places or events with large numbers of people in one  location, like concerts or sporting events. . Carefully consider travel plans you have or are making. . If you are planning any travel outside or inside the [place], visit the CDC's Travelers' Health webpage for the latest health notices. . If you have some symptoms but not all symptoms, continue to monitor at home and seek medical attention if your symptoms worsen. . If you are having a medical emergency, call 911.   [facility] / e-Visit: [email or web address]         [facility]: [facility]: [phone]                   [facility]: [phone] \

## 2019-01-23 NOTE — Telephone Encounter (Signed)
Scheduled appt per 5/15 los. ° °A calendar will be mailed out. °

## 2019-01-26 ENCOUNTER — Other Ambulatory Visit: Payer: Self-pay | Admitting: Emergency Medicine

## 2019-01-26 ENCOUNTER — Other Ambulatory Visit: Payer: Self-pay

## 2019-01-26 ENCOUNTER — Inpatient Hospital Stay (HOSPITAL_BASED_OUTPATIENT_CLINIC_OR_DEPARTMENT_OTHER): Payer: Federal, State, Local not specified - PPO | Admitting: Medical

## 2019-01-26 ENCOUNTER — Inpatient Hospital Stay: Payer: Federal, State, Local not specified - PPO

## 2019-01-26 ENCOUNTER — Telehealth: Payer: Self-pay

## 2019-01-26 ENCOUNTER — Encounter: Payer: Self-pay | Admitting: Hematology

## 2019-01-26 VITALS — BP 147/64 | HR 86 | Temp 99.2°F | Resp 18

## 2019-01-26 DIAGNOSIS — N390 Urinary tract infection, site not specified: Secondary | ICD-10-CM

## 2019-01-26 DIAGNOSIS — C50411 Malignant neoplasm of upper-outer quadrant of right female breast: Secondary | ICD-10-CM

## 2019-01-26 DIAGNOSIS — R350 Frequency of micturition: Secondary | ICD-10-CM

## 2019-01-26 DIAGNOSIS — Z95828 Presence of other vascular implants and grafts: Secondary | ICD-10-CM | POA: Diagnosis not present

## 2019-01-26 DIAGNOSIS — R51 Headache: Secondary | ICD-10-CM

## 2019-01-26 DIAGNOSIS — Z170 Estrogen receptor positive status [ER+]: Secondary | ICD-10-CM

## 2019-01-26 DIAGNOSIS — K219 Gastro-esophageal reflux disease without esophagitis: Secondary | ICD-10-CM | POA: Diagnosis not present

## 2019-01-26 DIAGNOSIS — D509 Iron deficiency anemia, unspecified: Secondary | ICD-10-CM

## 2019-01-26 DIAGNOSIS — R519 Headache, unspecified: Secondary | ICD-10-CM

## 2019-01-26 LAB — CBC WITH DIFFERENTIAL (CANCER CENTER ONLY)
Abs Immature Granulocytes: 0.01 10*3/uL (ref 0.00–0.07)
Basophils Absolute: 0 10*3/uL (ref 0.0–0.1)
Basophils Relative: 1 %
Eosinophils Absolute: 0 10*3/uL (ref 0.0–0.5)
Eosinophils Relative: 1 %
HCT: 36.4 % (ref 36.0–46.0)
Hemoglobin: 11.1 g/dL — ABNORMAL LOW (ref 12.0–15.0)
Immature Granulocytes: 0 %
Lymphocytes Relative: 34 %
Lymphs Abs: 1.2 10*3/uL (ref 0.7–4.0)
MCH: 27.7 pg (ref 26.0–34.0)
MCHC: 30.5 g/dL (ref 30.0–36.0)
MCV: 90.8 fL (ref 80.0–100.0)
Monocytes Absolute: 0.2 10*3/uL (ref 0.1–1.0)
Monocytes Relative: 5 %
Neutro Abs: 2.2 10*3/uL (ref 1.7–7.7)
Neutrophils Relative %: 59 %
Platelet Count: 308 10*3/uL (ref 150–400)
RBC: 4.01 MIL/uL (ref 3.87–5.11)
RDW: 19.6 % — ABNORMAL HIGH (ref 11.5–15.5)
WBC Count: 3.7 10*3/uL — ABNORMAL LOW (ref 4.0–10.5)
nRBC: 0.5 % — ABNORMAL HIGH (ref 0.0–0.2)

## 2019-01-26 LAB — CMP (CANCER CENTER ONLY)
ALT: 19 U/L (ref 0–44)
AST: 19 U/L (ref 15–41)
Albumin: 3.7 g/dL (ref 3.5–5.0)
Alkaline Phosphatase: 71 U/L (ref 38–126)
Anion gap: 8 (ref 5–15)
BUN: 11 mg/dL (ref 8–23)
CO2: 30 mmol/L (ref 22–32)
Calcium: 9.1 mg/dL (ref 8.9–10.3)
Chloride: 102 mmol/L (ref 98–111)
Creatinine: 0.75 mg/dL (ref 0.44–1.00)
GFR, Est AFR Am: >60 mL/min (ref >60)
GFR, Estimated: >60 mL/min (ref >60)
Glucose, Bld: 91 mg/dL (ref 70–99)
Potassium: 3.7 mmol/L (ref 3.5–5.1)
Sodium: 140 mmol/L (ref 135–145)
Total Bilirubin: 0.6 mg/dL (ref 0.3–1.2)
Total Protein: 6.7 g/dL (ref 6.5–8.1)

## 2019-01-26 LAB — URINALYSIS, COMPLETE (UACMP) WITH MICROSCOPIC
Bilirubin Urine: NEGATIVE
Glucose, UA: NEGATIVE mg/dL
Ketones, ur: NEGATIVE mg/dL
Leukocytes,Ua: NEGATIVE
Nitrite: NEGATIVE
Protein, ur: NEGATIVE mg/dL
Specific Gravity, Urine: 1.012 (ref 1.005–1.030)
pH: 7 (ref 5.0–8.0)

## 2019-01-26 LAB — FERRITIN: Ferritin: 143 ng/mL (ref 11–307)

## 2019-01-26 LAB — IRON AND TIBC
Iron: 142 ug/dL (ref 41–142)
Saturation Ratios: 50 % (ref 21–57)
TIBC: 285 ug/dL (ref 236–444)
UIBC: 143 ug/dL (ref 120–384)

## 2019-01-26 MED ORDER — OMEPRAZOLE 40 MG PO CPDR: 40.0000 mg | DELAYED_RELEASE_CAPSULE | Freq: Every day | ORAL | 5 refills | Status: DC

## 2019-01-26 MED ORDER — SODIUM CHLORIDE 0.9% FLUSH
10.0000 mL | Freq: Once | INTRAVENOUS | Status: AC
  Administered 2019-01-26: 10 mL
  Filled 2019-01-26: qty 10

## 2019-01-26 MED ORDER — HEPARIN SOD (PORK) LOCK FLUSH 100 UNIT/ML IV SOLN
500.0000 [IU] | Freq: Once | INTRAVENOUS | Status: AC
  Administered 2019-01-26: 500 [IU]
  Filled 2019-01-26: qty 5

## 2019-01-26 NOTE — Patient Instructions (Signed)
Urinary Tract Infection, Adult A urinary tract infection (UTI) is an infection of any part of the urinary tract. The urinary tract includes:  The kidneys.  The ureters.  The bladder.  The urethra. These organs make, store, and get rid of pee (urine) in the body. What are the causes? This is caused by germs (bacteria) in your genital area. These germs grow and cause swelling (inflammation) of your urinary tract. What increases the risk? You are more likely to develop this condition if:  You have a small, thin tube (catheter) to drain pee.  You cannot control when you pee or poop (incontinence).  You are female, and: ? You use these methods to prevent pregnancy: ? A medicine that kills sperm (spermicide). ? A device that blocks sperm (diaphragm). ? You have low levels of a female hormone (estrogen). ? You are pregnant.  You have genes that add to your risk.  You are sexually active.  You take antibiotic medicines.  You have trouble peeing because of: ? A prostate that is bigger than normal, if you are female. ? A blockage in the part of your body that drains pee from the bladder (urethra). ? A kidney stone. ? A nerve condition that affects your bladder (neurogenic bladder). ? Not getting enough to drink. ? Not peeing often enough.  You have other conditions, such as: ? Diabetes. ? A weak disease-fighting system (immune system). ? Sickle cell disease. ? Gout. ? Injury of the spine. What are the signs or symptoms? Symptoms of this condition include:  Needing to pee right away (urgently).  Peeing often.  Peeing small amounts often.  Pain or burning when peeing.  Blood in the pee.  Pee that smells bad or not like normal.  Trouble peeing.  Pee that is cloudy.  Fluid coming from the vagina, if you are female.  Pain in the belly or lower back. Other symptoms include:  Throwing up (vomiting).  No urge to eat.  Feeling mixed up (confused).  Being tired  and grouchy (irritable).  A fever.  Watery poop (diarrhea). How is this treated? This condition may be treated with:  Antibiotic medicine.  Other medicines.  Drinking enough water. Follow these instructions at home:  Medicines  Take over-the-counter and prescription medicines only as told by your doctor.  If you were prescribed an antibiotic medicine, take it as told by your doctor. Do not stop taking it even if you start to feel better. General instructions  Make sure you: ? Pee until your bladder is empty. ? Do not hold pee for a long time. ? Empty your bladder after sex. ? Wipe from front to back after pooping if you are a female. Use each tissue one time when you wipe.  Drink enough fluid to keep your pee pale yellow.  Keep all follow-up visits as told by your doctor. This is important. Contact a doctor if:  You do not get better after 1-2 days.  Your symptoms go away and then come back. Get help right away if:  You have very bad back pain.  You have very bad pain in your lower belly.  You have a fever.  You are sick to your stomach (nauseous).  You are throwing up. Summary  A urinary tract infection (UTI) is an infection of any part of the urinary tract.  This condition is caused by germs in your genital area.  There are many risk factors for a UTI. These include having a small, thin   tube to drain pee and not being able to control when you pee or poop.  Treatment includes antibiotic medicines for germs.  Drink enough fluid to keep your pee pale yellow. This information is not intended to replace advice given to you by your health care provider. Make sure you discuss any questions you have with your health care provider. Document Released: 02/13/2008 Document Revised: 03/06/2018 Document Reviewed: 03/06/2018 Elsevier Interactive Patient Education  2019 Elsevier Inc.  

## 2019-01-26 NOTE — Telephone Encounter (Signed)
Spoke with patient about coming in to be seen in [facility], she agreed, she will come at 1:00 for lab work and see Sandi Mealy at 1:45 in [facility]

## 2019-01-26 NOTE — Telephone Encounter (Signed)
Patient calls with complaint of urinary frequency and discomfort.  She thinks she has a UTI has been up all night.  Also her headaches are getting worse not relieved by Tylenol.  They also are keeping her awake.  [phone]

## 2019-01-26 NOTE — Progress Notes (Signed)
[facility]pt[facility] Clinic Progress Note   [name] [id number] 08/24/1951 68 y.o.  Summer [name] is managed by Dr. Truitt Merle  Actively treated with chemotherapy/immunotherapy/hormonal therapy: yes  Current therapy: Paclitaxel and trastuzumab  Last treated: 01/23/2019 (cycle 1, day 22)  Next scheduled appointment with provider: 02/20/2019  Assessment: Plan:    Port-A-Cath in place - Plan: sodium chloride flush (NS) 0.9 % injection 10 mL, heparin lock flush 100 unit/mL  Malignant neoplasm of upper-outer quadrant of right breast in female, estrogen receptor positive ([place])  Gastroesophageal reflux disease, esophagitis presence not specified - Plan: omeprazole (PRILOSEC) 40 MG capsule  Nonintractable headache, unspecified chronicity pattern, unspecified headache type  Urinary frequency   Stage I 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  She will return on 01/30/2019 for consideration of cycle 1, day 29 of therapy.  GERD: The patient was given a prescription for omeprazole 40 mg once daily.  Headaches: The patient was told that she could use Goody's powder occasionally if needed for headaches since she states that this is 1 of the few things that help.  Urinary frequency and urgency: A urinalysis returned showing no evidence of a urinary tract infection.  A urine culture is pending.  Please see After Visit Summary for patient specific instructions.  Future Appointments  Date Time Provider [place]  01/30/2019 10:15 AM [facility] LAB 2 [facility] None  01/30/2019 10:30 AM [facility] [place] FLUSH [facility] None  01/30/2019 11:00 AM [facility] INFUSION [facility] None  02/03/2019  2:40 PM GI-315 MR 2 GI-315MRI [facility] [address]  02/03/2019  3:30 PM GI-315 MR 2 GI-315MRI GI-[facility] [address]  02/06/2019 10:15 AM [facility] LAB 5 [facility] None   02/06/2019 10:30 AM [facility] [place] FLUSH [facility] None  02/06/2019 11:00 AM [facility] INFUSION [facility] None  02/10/2019  9:40 AM Sueanne Margarita, MD [facility]  02/13/2019 11:15 AM [facility] LAB 4 [facility] None  02/13/2019 11:30 AM [facility] [place] FLUSH [facility] None  02/13/2019 12:15 PM [facility] INFUSION [facility] None  02/20/2019  8:00 AM [facility] LAB 3 [facility] None  02/20/2019  8:15 AM [facility] [place] FLUSH [facility] None  02/20/2019  8:30 AM Truitt Merle, MD [facility] None  02/20/2019  9:30 AM [facility] INFUSION [facility] None  02/27/2019 10:00 AM [facility] LAB ONLY [facility] None  02/27/2019 10:15 AM [facility] [place] FLUSH [facility] None  02/27/2019 11:00 AM [facility] INFUSION [facility] None  03/06/2019  9:45 AM [facility] LAB 4 [facility] None  03/06/2019 10:00 AM [facility] [place] FLUSH [facility] None  03/06/2019 10:30 AM Truitt Merle, MD [facility] None  03/06/2019 11:30 AM [facility] INFUSION [facility] None  03/12/2019  9:45 AM [facility] LAB ONLY [facility] None  03/12/2019 10:00 AM [facility] [place] FLUSH [facility] None  03/12/2019 10:30 AM [facility] INFUSION [facility] None  03/20/2019  9:00 AM [facility] LAB 3 [facility] None  03/20/2019  9:15 AM [facility] [place] FLUSH [facility] None  03/20/2019  9:45 AM Truitt Merle, MD [facility] None  03/20/2019 11:00 AM [facility] INFUSION [facility] None  04/02/2019  3:15 PM Deveshwar, Abel Presto, MD [facility] None    No orders of the defined types were placed in this encounter.      Subjective:   Patient ID:  Summer [name] is a 68 y.o. (DOB [birth year]) female.  Chief Complaint:  Chief Complaint  Patient presents with  . Urinary Tract Infection    HPI Summer [name] is a 68 year old  female with a diagnosis of a stage I ER positive malignant neoplasm of the right breast.  She is status post cycle 1, day 22 of paclitaxel and trastuzumab which was dosed on 01/23/2019.  She presents to the office today with  multiple issues of concern including urinary frequency and urgency, nausea with one episode of vomiting this morning, stomach upset, and a headache which is ongoing.  She denies photosensitivity or visual changes.  She denies dysuria, hematuria, or foul-smelling urine.  She also denies fevers, chills, or sweats.  She reports that she has been taking BC powders and Goody's powders as needed for her headaches as these help.  She has a history of episodic headaches.  They have been more frequent since she has begun chemotherapy however.  Medications: I have reviewed the patient's current medications.  Allergies:  Allergies  Allergen Reactions  . Clindamycin Hcl Itching    Past Medical History:  Diagnosis Date  . Abdominal pain, left lower quadrant 11/09/2009   Qualifier: Diagnosis of  By: Valma Cava LPN, Izora Gala    . Allergic rhinitis 08/26/2008   Qualifier: Diagnosis of  By: Sherren Mocha MD, Jory Ee   . Anemia, iron deficiency 11/21/2018  . Arthritis shoulders and back  . BACK PAIN, CHRONIC 06/13/2007   Qualifier: Diagnosis of  By: Sherren Mocha MD, Jory Ee   . 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 Constipation 07/01/2018  . Costochondritis 05/04/2014  . Diverticulitis of colon 06/13/2007   S/p segmental colectomy for perforated diverticulitis   . Diverticulosis   . Facial pain 10/06/2015  . Fibromyalgia   . Foot sprain, left, initial encounter 08/31/2016  . Glossitis 02/02/2016  . Headache 04/24/2016  . HEMATURIA UNSPECIFIED 06/25/2008   Qualifier: Diagnosis of  By: Sherren Mocha MD, Jory Ee   . HEMATURIA, HX OF 11/14/2009   Qualifier: Diagnosis of  By: Regino Schultze CMA (AAMA), Apolonio Schneiders    . History of kidney stones   . History of kidney stones   . History of shingles 2012--  no residual pain  . Itching 10/13/2018  . Left eye pain 07/01/2018  . Left ureteral calculus   . LLQ pain 04/24/2016  . LOC OSTEOARTHROS NOT SPEC PRIM/SEC OTH [facility] SITE 06/27/2009   Qualifier:  Diagnosis of  By: Sherren Mocha MD, Jory Ee   . Malignant neoplasm of upper-outer quadrant of right breast in female, estrogen receptor positive ([place]) 11/17/2018  . MCI (mild cognitive impairment)   . Memory loss 12/21/2016  . Morbid obesity ([place]) 11/09/2010  . Pain of right side of body 08/31/2016  . Pruritus 10/26/2016  . Right sided temporal headache 12/21/2016  . Tremor 12/21/2016  . Trigeminal neuralgia RIGHT  . Trigeminal neuralgia of right side of face 11/30/2010  . Trochanteric bursitis, right hip 10/26/2016  . Urgency of urination   . Weight loss 07/22/2017  . Zoster without complications 3/97/6734    Past Surgical History:  Procedure Laterality Date  . BREAST LUMPECTOMY WITH RADIOACTIVE SEED AND SENTINEL LYMPH NODE BIOPSY Right 11/28/2018   Procedure: RIGHT BREAST LUMPECTOMY WITH RADIOACTIVE SEED AND RIGHT AXILLARY SENTINEL LYMPH NODE BIOPSY;  Surgeon: Excell Seltzer, MD;  Location: [facility];  Service: General;  Laterality: Right;  . CEREBRAL MICROVASCULAR DECOMPRESSION  07-05-2006   RIGHT TRIGEMINAL NERVE  . CYSTOSCOPY/RETROGRADE/URETEROSCOPY/STONE EXTRACTION WITH BASKET  07/04/2012   Procedure: CYSTOSCOPY/RETROGRADE/URETEROSCOPY/STONE EXTRACTION WITH BASKET;  Surgeon: Claybon Jabs, MD;  Location: [facility];  Service: Urology;  Laterality:  Left;  . EXPLORATORY LAPAROTOMY/ RESECTION MID TO DISTAL SIGMOID AND PROXIMAL RECTUM/ END PROXIMAL SIGMOID COLOSTOMY  07-17-2006   PERFORATED DIVERTICULITIS WITH PERITONITIS  . gamma knife  05/17/2016   for trigeminal neuralgia, [facility], Dr Salomon Fick  . gamma knife  2019  . PORTACATH PLACEMENT Left 11/28/2018   Procedure: INSERTION PORT-A-CATH WITH ULTRASOUND;  Surgeon: Excell Seltzer, MD;  Location: [facility];  Service: General;  Laterality: Left;  . RESECTION COLOSTOMY/ CLOSURE COLOSTOMY WITH COLOPROCTOSTOMY  02-20-2007  . TONSILLECTOMY  AS CHILD  . URETEROSCOPY  07/04/2012   Procedure: URETEROSCOPY;  Surgeon: Claybon Jabs, MD;   Location: [facility];  Service: Urology;  Laterality: Left;  Marland Kitchen VAGINAL HYSTERECTOMY  1998   Partial  . WISDOM TOOTH EXTRACTION      Family History  Problem Relation Age of Onset  . Heart disease Maternal Grandmother   . Diabetes Mother   . Breast cancer Maternal Aunt   . Lupus Cousin   . Colon cancer Neg Hx   . Esophageal cancer Neg Hx   . Rectal cancer Neg Hx   . Stomach cancer Neg Hx     Social History   Socioeconomic History  . Marital status: Married    Spouse name: Not on file  . Number of children: 1  . Years of education: College  . Highest education level: Not on file  Occupational History    Employer: UNEMPLOYED  . Occupation: HUMAN RESOURCES     Employer: [employer]  Social Needs  . Financial resource strain: Not on file  . Food insecurity:    Worry: Not on file    Inability: Not on file  . Transportation needs:    Medical: Not on file    Non-medical: Not on file  Tobacco Use  . Smoking status: Never Smoker  . Smokeless tobacco: Never Used  Substance and Sexual Activity  . Alcohol use: Yes    Comment: rarely  . Drug use: No  . Sexual activity: Not on file  Lifestyle  . Physical activity:    Days per week: Not on file    Minutes per session: Not on file  . Stress: Not on file  Relationships  . Social connections:    Talks on phone: Not on file    Gets together: Not on file    Attends religious service: Not on file    Active member of club or organization: Not on file    Attends meetings of clubs or organizations: Not on file    Relationship status: Not on file  . Intimate partner violence:    Fear of current or ex partner: Not on file    Emotionally abused: Not on file    Physically abused: Not on file    Forced sexual activity: Not on file  Other Topics Concern  . Not on file  Social History Narrative  . Not on file    Past Medical History, Surgical history, Social history, and Family history were reviewed and updated  as appropriate.   Please see review of systems for further details on the patient's review from today.   Review of Systems:  Review of Systems  Constitutional: Positive for appetite change. Negative for chills, diaphoresis and fever.  HENT: Negative for postnasal drip, sinus pressure, sinus pain, sneezing, sore throat and trouble swallowing.   Eyes: Negative for visual disturbance.  Respiratory: Negative for cough, choking, shortness of breath and wheezing.   Cardiovascular: Negative for chest  pain and palpitations.  Gastrointestinal: Positive for nausea and vomiting. Negative for constipation and diarrhea.       GERD  Genitourinary: Negative for decreased urine volume.  Neurological: Positive for headaches.    Objective:   Physical Exam:  BP (!) 147/64 (BP Location: Left Arm, Patient Position: Sitting)   Pulse 86   Temp 99.2 F (37.3 C) (Oral)   Resp 18   SpO2 100%  ECOG: 0  Physical Exam Constitutional:      General: She is not in acute distress.    Appearance: She is not diaphoretic.  HENT:     Head: Normocephalic and atraumatic.  Cardiovascular:     Rate and Rhythm: Normal rate and regular rhythm.     Heart sounds: Normal heart sounds. No murmur. No friction rub. No gallop.   Pulmonary:     Effort: Pulmonary effort is normal. No respiratory distress.     Breath sounds: Normal breath sounds. No wheezing or rales.  Abdominal:     General: Bowel sounds are normal. There is no distension.     Palpations: Abdomen is soft.     Tenderness: There is no abdominal tenderness. There is no guarding.  Skin:    General: Skin is warm and dry.     Findings: No erythema or rash.  Neurological:     Mental Status: She is alert.     Coordination: Coordination normal.     Gait: Gait normal.     Lab Review:     Component Value Date/Time   NA 140 01/26/2019 1341   K 3.7 01/26/2019 1341   CL 102 01/26/2019 1341   CO2 30 01/26/2019 1341   GLUCOSE 91 01/26/2019 1341   GLUCOSE  96 08/20/2006 1433   BUN 11 01/26/2019 1341   CREATININE 0.75 01/26/2019 1341   CALCIUM 9.1 01/26/2019 1341   PROT 6.7 01/26/2019 1341   ALBUMIN 3.7 01/26/2019 1341   AST 19 01/26/2019 1341   ALT 19 01/26/2019 1341   ALKPHOS 71 01/26/2019 1341   BILITOT 0.6 01/26/2019 1341   GFRNONAA >60 01/26/2019 1341   GFRAA >60 01/26/2019 1341       Component Value Date/Time   WBC 3.7 (L) 01/26/2019 1341   WBC 7.9 10/12/2018 2328   RBC 4.01 01/26/2019 1341   HGB 11.1 (L) 01/26/2019 1341   HCT 36.4 01/26/2019 1341   HCT 30.4 (L) 11/19/2018 1543   PLT 308 01/26/2019 1341   MCV 90.8 01/26/2019 1341   MCH 27.7 01/26/2019 1341   MCHC 30.5 01/26/2019 1341   RDW 19.6 (H) 01/26/2019 1341   LYMPHSABS 1.2 01/26/2019 1341   MONOABS 0.2 01/26/2019 1341   EOSABS 0.0 01/26/2019 1341   BASOSABS 0.0 01/26/2019 1341   -------------------------------  Imaging from last 24 hours (if applicable):  Radiology interpretation: No results found.

## 2019-01-27 ENCOUNTER — Telehealth: Payer: Self-pay | Admitting: *Deleted

## 2019-01-27 ENCOUNTER — Encounter: Payer: Self-pay | Admitting: Emergency Medicine

## 2019-01-27 LAB — URINE CULTURE: Culture: NO GROWTH

## 2019-01-27 NOTE — Telephone Encounter (Signed)
Spoke to pt she was at work and not able to make appt right then.  Will call back to reschedule 3 month f/u.  (aroung 04/09/19).

## 2019-01-27 NOTE — Addendum Note (Signed)
Addended by: Caryl Comes E on: 01/27/2019 11:13 AM   Modules accepted: SmartSet

## 2019-01-30 ENCOUNTER — Inpatient Hospital Stay: Payer: Federal, State, Local not specified - PPO

## 2019-01-30 ENCOUNTER — Other Ambulatory Visit: Payer: Self-pay

## 2019-01-30 VITALS — BP 108/62 | HR 78 | Temp 98.5°F | Resp 18 | Wt 181.8 lb

## 2019-01-30 DIAGNOSIS — C50411 Malignant neoplasm of upper-outer quadrant of right female breast: Secondary | ICD-10-CM | POA: Diagnosis not present

## 2019-01-30 DIAGNOSIS — D509 Iron deficiency anemia, unspecified: Secondary | ICD-10-CM

## 2019-01-30 DIAGNOSIS — Z95828 Presence of other vascular implants and grafts: Secondary | ICD-10-CM

## 2019-01-30 DIAGNOSIS — Z170 Estrogen receptor positive status [ER+]: Secondary | ICD-10-CM

## 2019-01-30 LAB — CMP (CANCER CENTER ONLY)
ALT: 15 U/L (ref 0–44)
AST: 18 U/L (ref 15–41)
Albumin: 3.7 g/dL (ref 3.5–5.0)
Alkaline Phosphatase: 74 U/L (ref 38–126)
Anion gap: 8 (ref 5–15)
BUN: 15 mg/dL (ref 8–23)
CO2: 29 mmol/L (ref 22–32)
Calcium: 9.7 mg/dL (ref 8.9–10.3)
Chloride: 105 mmol/L (ref 98–111)
Creatinine: 0.81 mg/dL (ref 0.44–1.00)
GFR, Est AFR Am: >60 mL/min (ref >60)
GFR, Estimated: >60 mL/min (ref >60)
Glucose, Bld: 109 mg/dL — ABNORMAL HIGH (ref 70–99)
Potassium: 4.1 mmol/L (ref 3.5–5.1)
Sodium: 142 mmol/L (ref 135–145)
Total Bilirubin: 0.4 mg/dL (ref 0.3–1.2)
Total Protein: 6.9 g/dL (ref 6.5–8.1)

## 2019-01-30 LAB — CBC WITH DIFFERENTIAL (CANCER CENTER ONLY)
Abs Immature Granulocytes: 0.06 10*3/uL (ref 0.00–0.07)
Basophils Absolute: 0.1 10*3/uL (ref 0.0–0.1)
Basophils Relative: 2 %
Eosinophils Absolute: 0.1 10*3/uL (ref 0.0–0.5)
Eosinophils Relative: 2 %
HCT: 39 % (ref 36.0–46.0)
Hemoglobin: 12 g/dL (ref 12.0–15.0)
Immature Granulocytes: 2 %
Lymphocytes Relative: 36 %
Lymphs Abs: 1.1 10*3/uL (ref 0.7–4.0)
MCH: 27.9 pg (ref 26.0–34.0)
MCHC: 30.8 g/dL (ref 30.0–36.0)
MCV: 90.7 fL (ref 80.0–100.0)
Monocytes Absolute: 0.2 10*3/uL (ref 0.1–1.0)
Monocytes Relative: 6 %
Neutro Abs: 1.6 10*3/uL — ABNORMAL LOW (ref 1.7–7.7)
Neutrophils Relative %: 52 %
Platelet Count: 334 10*3/uL (ref 150–400)
RBC: 4.3 MIL/uL (ref 3.87–5.11)
RDW: 19.3 % — ABNORMAL HIGH (ref 11.5–15.5)
WBC Count: 3.1 10*3/uL — ABNORMAL LOW (ref 4.0–10.5)
nRBC: 0 % (ref 0.0–0.2)

## 2019-01-30 MED ORDER — SODIUM CHLORIDE 0.9 % IV SOLN
Freq: Once | INTRAVENOUS | Status: AC
  Administered 2019-01-30: 12:00:00 via INTRAVENOUS
  Filled 2019-01-30: qty 250

## 2019-01-30 MED ORDER — DIPHENHYDRAMINE HCL 50 MG/ML IJ SOLN
50.0000 mg | Freq: Once | INTRAMUSCULAR | Status: AC
  Administered 2019-01-30: 12:00:00 50 mg via INTRAVENOUS

## 2019-01-30 MED ORDER — SODIUM CHLORIDE 0.9% FLUSH
10.0000 mL | INTRAVENOUS | Status: DC | PRN
  Administered 2019-01-30: 10 mL
  Filled 2019-01-30: qty 10

## 2019-01-30 MED ORDER — DIPHENHYDRAMINE HCL 50 MG/ML IJ SOLN
INTRAMUSCULAR | Status: AC
  Filled 2019-01-30: qty 1

## 2019-01-30 MED ORDER — ACETAMINOPHEN 325 MG PO TABS
650.0000 mg | ORAL_TABLET | Freq: Once | ORAL | Status: AC
  Administered 2019-01-30: 650 mg via ORAL

## 2019-01-30 MED ORDER — ACETAMINOPHEN 325 MG PO TABS
ORAL_TABLET | ORAL | Status: AC
  Filled 2019-01-30: qty 2

## 2019-01-30 MED ORDER — FAMOTIDINE IN NACL 20-0.9 MG/50ML-% IV SOLN
20.0000 mg | Freq: Once | INTRAVENOUS | Status: AC
  Administered 2019-01-30: 20 mg via INTRAVENOUS

## 2019-01-30 MED ORDER — DEXAMETHASONE SODIUM PHOSPHATE 10 MG/ML IJ SOLN
10.0000 mg | Freq: Once | INTRAMUSCULAR | Status: AC
  Administered 2019-01-30: 10 mg via INTRAVENOUS

## 2019-01-30 MED ORDER — TRASTUZUMAB CHEMO 150 MG IV SOLR
2.0000 mg/kg | Freq: Once | INTRAVENOUS | Status: AC
  Administered 2019-01-30: 168 mg via INTRAVENOUS
  Filled 2019-01-30: qty 8

## 2019-01-30 MED ORDER — FAMOTIDINE IN NACL 20-0.9 MG/50ML-% IV SOLN
INTRAVENOUS | Status: AC
  Filled 2019-01-30: qty 50

## 2019-01-30 MED ORDER — PALONOSETRON HCL INJECTION 0.25 MG/5ML
INTRAVENOUS | Status: AC
  Filled 2019-01-30: qty 5

## 2019-01-30 MED ORDER — HEPARIN SOD (PORK) LOCK FLUSH 100 UNIT/ML IV SOLN
500.0000 [IU] | Freq: Once | INTRAVENOUS | Status: AC | PRN
  Administered 2019-01-30: 500 [IU]
  Filled 2019-01-30: qty 5

## 2019-01-30 MED ORDER — SODIUM CHLORIDE 0.9 % IV SOLN
80.0000 mg/m2 | Freq: Once | INTRAVENOUS | Status: AC
  Administered 2019-01-30: 14:00:00 162 mg via INTRAVENOUS
  Filled 2019-01-30: qty 27

## 2019-01-30 MED ORDER — SODIUM CHLORIDE 0.9% FLUSH
10.0000 mL | Freq: Once | INTRAVENOUS | Status: AC
  Administered 2019-01-30: 10 mL
  Filled 2019-01-30: qty 10

## 2019-01-30 MED ORDER — DEXAMETHASONE SODIUM PHOSPHATE 10 MG/ML IJ SOLN
INTRAMUSCULAR | Status: AC
  Filled 2019-01-30: qty 1

## 2019-01-30 NOTE — Patient Instructions (Signed)
[facility] Discharge Instructions for Patients Receiving Chemotherapy  Today you received the following chemotherapy agents Trastuzumab (HERCEPTIN) & Paclitaxel (TAXOL).  To help prevent nausea and vomiting after your treatment, we encourage you to take your nausea medication as prescribed.   If you develop nausea and vomiting that is not controlled by your nausea medication, call the clinic.   BELOW ARE SYMPTOMS THAT SHOULD BE REPORTED IMMEDIATELY:  *FEVER GREATER THAN 100.5 F  *CHILLS WITH OR WITHOUT FEVER  NAUSEA AND VOMITING THAT IS NOT CONTROLLED WITH YOUR NAUSEA MEDICATION  *UNUSUAL SHORTNESS OF BREATH  *UNUSUAL BRUISING OR BLEEDING  TENDERNESS IN MOUTH AND THROAT WITH OR WITHOUT PRESENCE OF ULCERS  *URINARY PROBLEMS  *BOWEL PROBLEMS  UNUSUAL RASH Items with * indicate a potential emergency and should be followed up as soon as possible.  Feel free to call the clinic should you have any questions or concerns. The clinic phone number is [phone].  Please show the [facility] at check-in to the Emergency Department and triage nurse.  Coronavirus (COVID-19) Are you at risk?  Are you at risk for the Coronavirus (COVID-19)?  To be considered HIGH RISK for Coronavirus (COVID-19), you have to meet the following criteria:  . Traveled to [place], [place], [place], [place] or [place]; or in the [place] to [place], [place], [place], or [place]; and have fever, cough, and shortness of breath within the last 2 weeks of travel OR . Been in close contact with a person diagnosed with COVID-19 within the last 2 weeks and have fever, cough, and shortness of breath . IF YOU DO NOT MEET THESE CRITERIA, YOU ARE CONSIDERED LOW RISK FOR COVID-19.  What to do if you are HIGH RISK for COVID-19?  Marland Kitchen If you are having a medical emergency, call 911. . Seek medical care right away. Before you go to a doctor's office, urgent care or emergency department,  call ahead and tell them about your recent travel, contact with someone diagnosed with COVID-19, and your symptoms. You should receive instructions from your physician's office regarding next steps of care.  . When you arrive at healthcare provider, tell the healthcare staff immediately you have returned from visiting [place], [place], [place], [place] or [place]; or traveled in the [place] to [place], [place], [place], or [place]; in the last two weeks or you have been in close contact with a person diagnosed with COVID-19 in the last 2 weeks.   . Tell the health care staff about your symptoms: fever, cough and shortness of breath. . After you have been seen by a medical provider, you will be either: o Tested for (COVID-19) and discharged home on quarantine except to seek medical care if symptoms worsen, and asked to  - Stay home and avoid contact with others until you get your results (4-5 days)  - Avoid travel on public transportation if possible (such as bus, train, or airplane) or o Sent to the Emergency Department by EMS for evaluation, COVID-19 testing, and possible admission depending on your condition and test results.  What to do if you are LOW RISK for COVID-19?  Reduce your risk of any infection by using the same precautions used for avoiding the common cold or flu:  Marland Kitchen Wash your hands often with soap and warm water for at least 20 seconds.  If soap and water are not readily available, use an alcohol-based hand sanitizer with at least 60% alcohol.  Marland Kitchen  If coughing or sneezing, cover your mouth and nose by coughing or sneezing into the elbow areas of your shirt or coat, into a tissue or into your sleeve (not your hands). . Avoid shaking hands with others and consider head nods or verbal greetings only. . Avoid touching your eyes, nose, or mouth with unwashed hands.  . Avoid close contact with people who are sick. . Avoid places or events with large numbers of people in one  location, like concerts or sporting events. . Carefully consider travel plans you have or are making. . If you are planning any travel outside or inside the [place], visit the CDC's Travelers' Health webpage for the latest health notices. . If you have some symptoms but not all symptoms, continue to monitor at home and seek medical attention if your symptoms worsen. . If you are having a medical emergency, call 911.   [place] / e-Visit: [email or web address]         [facility]: [facility]: [phone]                   [facility]: [phone] \

## 2019-02-03 ENCOUNTER — Other Ambulatory Visit: Payer: Self-pay

## 2019-02-03 ENCOUNTER — Inpatient Hospital Stay: Payer: Federal, State, Local not specified - PPO

## 2019-02-03 ENCOUNTER — Encounter: Payer: Self-pay | Admitting: Hematology

## 2019-02-03 ENCOUNTER — Ambulatory Visit
Admission: RE | Admit: 2019-02-03 | Discharge: 2019-02-03 | Disposition: A | Discharge status: Home / Self Care | Payer: Federal, State, Local not specified - PPO | Source: Ambulatory Visit | Attending: Internal Medicine | Admitting: Internal Medicine

## 2019-02-03 ENCOUNTER — Other Ambulatory Visit: Payer: Self-pay | Admitting: *Deleted

## 2019-02-03 ENCOUNTER — Other Ambulatory Visit: Payer: Self-pay | Admitting: Internal Medicine

## 2019-02-03 ENCOUNTER — Inpatient Hospital Stay: Payer: Federal, State, Local not specified - PPO | Admitting: Hematology

## 2019-02-03 ENCOUNTER — Telehealth: Payer: Self-pay | Admitting: *Deleted

## 2019-02-03 VITALS — BP 130/71 | HR 83 | Temp 97.8°F | Resp 18 | Ht 66.0 in | Wt 180.9 lb

## 2019-02-03 DIAGNOSIS — C50411 Malignant neoplasm of upper-outer quadrant of right female breast: Secondary | ICD-10-CM

## 2019-02-03 DIAGNOSIS — R439 Unspecified disturbances of smell and taste: Secondary | ICD-10-CM

## 2019-02-03 DIAGNOSIS — R5383 Other fatigue: Secondary | ICD-10-CM

## 2019-02-03 DIAGNOSIS — Z170 Estrogen receptor positive status [ER+]: Secondary | ICD-10-CM

## 2019-02-03 DIAGNOSIS — G44091 Other trigeminal autonomic cephalgias (TAC), intractable: Secondary | ICD-10-CM

## 2019-02-03 DIAGNOSIS — D509 Iron deficiency anemia, unspecified: Secondary | ICD-10-CM

## 2019-02-03 DIAGNOSIS — Z95828 Presence of other vascular implants and grafts: Secondary | ICD-10-CM | POA: Diagnosis not present

## 2019-02-03 DIAGNOSIS — M1990 Unspecified osteoarthritis, unspecified site: Secondary | ICD-10-CM

## 2019-02-03 DIAGNOSIS — I351 Nonrheumatic aortic (valve) insufficiency: Secondary | ICD-10-CM

## 2019-02-03 DIAGNOSIS — T451X5S Adverse effect of antineoplastic and immunosuppressive drugs, sequela: Secondary | ICD-10-CM

## 2019-02-03 DIAGNOSIS — L02425 Furuncle of right lower limb: Secondary | ICD-10-CM

## 2019-02-03 DIAGNOSIS — R634 Abnormal weight loss: Secondary | ICD-10-CM

## 2019-02-03 DIAGNOSIS — G500 Trigeminal neuralgia: Secondary | ICD-10-CM

## 2019-02-03 DIAGNOSIS — R630 Anorexia: Secondary | ICD-10-CM

## 2019-02-03 DIAGNOSIS — M797 Fibromyalgia: Secondary | ICD-10-CM

## 2019-02-03 DIAGNOSIS — R29818 Other symptoms and signs involving the nervous system: Secondary | ICD-10-CM

## 2019-02-03 DIAGNOSIS — Z79899 Other long term (current) drug therapy: Secondary | ICD-10-CM

## 2019-02-03 DIAGNOSIS — G620 Drug-induced polyneuropathy: Secondary | ICD-10-CM

## 2019-02-03 DIAGNOSIS — Z87442 Personal history of urinary calculi: Secondary | ICD-10-CM

## 2019-02-03 LAB — CMP (CANCER CENTER ONLY)
ALT: 20 U/L (ref 0–44)
AST: 23 U/L (ref 15–41)
Albumin: 3.7 g/dL (ref 3.5–5.0)
Alkaline Phosphatase: 73 U/L (ref 38–126)
Anion gap: 6 (ref 5–15)
BUN: 11 mg/dL (ref 8–23)
CO2: 30 mmol/L (ref 22–32)
Calcium: 9.1 mg/dL (ref 8.9–10.3)
Chloride: 105 mmol/L (ref 98–111)
Creatinine: 0.75 mg/dL (ref 0.44–1.00)
GFR, Est AFR Am: >60 mL/min (ref >60)
GFR, Estimated: >60 mL/min (ref >60)
Glucose, Bld: 103 mg/dL — ABNORMAL HIGH (ref 70–99)
Potassium: 4.4 mmol/L (ref 3.5–5.1)
Sodium: 141 mmol/L (ref 135–145)
Total Bilirubin: 0.3 mg/dL (ref 0.3–1.2)
Total Protein: 6.9 g/dL (ref 6.5–8.1)

## 2019-02-03 LAB — CBC WITH DIFFERENTIAL (CANCER CENTER ONLY)
Abs Immature Granulocytes: 0.01 10*3/uL (ref 0.00–0.07)
Basophils Absolute: 0 10*3/uL (ref 0.0–0.1)
Basophils Relative: 1 %
Eosinophils Absolute: 0 10*3/uL (ref 0.0–0.5)
Eosinophils Relative: 1 %
HCT: 39.8 % (ref 36.0–46.0)
Hemoglobin: 12.1 g/dL (ref 12.0–15.0)
Immature Granulocytes: 0 %
Lymphocytes Relative: 36 %
Lymphs Abs: 1.4 10*3/uL (ref 0.7–4.0)
MCH: 27.9 pg (ref 26.0–34.0)
MCHC: 30.4 g/dL (ref 30.0–36.0)
MCV: 91.9 fL (ref 80.0–100.0)
Monocytes Absolute: 0.2 10*3/uL (ref 0.1–1.0)
Monocytes Relative: 4 %
Neutro Abs: 2.1 10*3/uL (ref 1.7–7.7)
Neutrophils Relative %: 58 %
Platelet Count: 313 10*3/uL (ref 150–400)
RBC: 4.33 MIL/uL (ref 3.87–5.11)
RDW: 19.3 % — ABNORMAL HIGH (ref 11.5–15.5)
WBC Count: 3.7 10*3/uL — ABNORMAL LOW (ref 4.0–10.5)
nRBC: 0.5 % — ABNORMAL HIGH (ref 0.0–0.2)

## 2019-02-03 MED ORDER — SODIUM CHLORIDE 0.9% FLUSH
10.0000 mL | Freq: Once | INTRAVENOUS | Status: AC
  Administered 2019-02-03: 10 mL
  Filled 2019-02-03: qty 10

## 2019-02-03 MED ORDER — GADOBENATE DIMEGLUMINE 529 MG/ML IV SOLN
18.0000 mL | Freq: Once | INTRAVENOUS | Status: AC | PRN
  Administered 2019-02-03: 18 mL via INTRAVENOUS

## 2019-02-03 MED ORDER — HEPARIN SOD (PORK) LOCK FLUSH 100 UNIT/ML IV SOLN
500.0000 [IU] | Freq: Once | INTRAVENOUS | Status: AC
  Administered 2019-02-03: 500 [IU]
  Filled 2019-02-03: qty 5
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
## 2019-02-03 NOTE — Telephone Encounter (Signed)
Received call from patient stating that she is having several side effects from her chemotherapy-pt is on Taxol and herceptin. Most recent treatment was on 01/30/19 She states she is having sensation changes in her toes and hands and also skin coloring changes in her hands and tongue-becoming darker. She states her skin is becoming very dry and itchy. She c/o increase in feeling nervous and has dizzyness. She c/o significant decrease in taste and decreased appetite. She states she is forcing herself to eat.  She states, on her home scales, she weighs 178. Her last weight here was 191. She is very concerned about the weight loss. She admits she is not eating enough. She is drinking fluids- mostly water but not 64 oz per. day Discussed with Dr. Burr Medico. Pt is to come in today for labs and to see Dr. Burr Medico. Pt is agreeable to this plan

## 2019-02-04 ENCOUNTER — Telehealth: Payer: Self-pay

## 2019-02-04 ENCOUNTER — Telehealth: Payer: Self-pay | Admitting: Hematology

## 2019-02-04 NOTE — Telephone Encounter (Signed)
Scheduled appt per 5/26 los. °

## 2019-02-04 NOTE — Telephone Encounter (Signed)
Nutrition  Referral received from Dr. Burr Medico.    Chart reviewed.    Called patient this am and no answer.  Left message on voicemail with call back number.  Spero Gunnels B. Zenia Resides, [place], [place] Registered Dietitian [phone] (pager)

## 2019-02-05 ENCOUNTER — Telehealth: Payer: Self-pay | Admitting: Hematology

## 2019-02-05 NOTE — Telephone Encounter (Signed)
Scheduled appt per sch msg. Called and left msg.  °

## 2019-02-06 ENCOUNTER — Inpatient Hospital Stay: Payer: Federal, State, Local not specified - PPO

## 2019-02-06 ENCOUNTER — Telehealth: Payer: Self-pay | Admitting: Cardiology

## 2019-02-06 ENCOUNTER — Telehealth: Payer: Self-pay | Admitting: *Deleted

## 2019-02-06 NOTE — Telephone Encounter (Signed)
Spoke with patient to follow up to see how she is doing with her treatments.  She states she doing ok except some numbness and tingling she is having in her hands and feet.  She was supposed to have treatment today but is being held this week and Dr. Burr Medico will re-evaluate next week 6/5.  She is having trouble with some constipation as well.  She has tried Miralax which she says never works for her.  She has been doing dulcolax.  She states she is passing gas. Informed she could try magnesium sulfate and try half the bottle first to see if she gets results and if no results could drink the other half.  Instructed her to call us back is she continues to have problems.  Patient verbalized understanding.

## 2019-02-06 NOTE — Telephone Encounter (Signed)
New Message              Patient is calling in today to get the link for her Virtual appointment with Dr. Radford Pax in June. Pls call to advise.

## 2019-02-06 NOTE — Telephone Encounter (Signed)

## 2019-02-09 ENCOUNTER — Other Ambulatory Visit: Payer: Self-pay | Admitting: Medical

## 2019-02-09 DIAGNOSIS — M62838 Other muscle spasm: Secondary | ICD-10-CM

## 2019-02-09 DIAGNOSIS — M791 Myalgia, unspecified site: Secondary | ICD-10-CM

## 2019-02-09 NOTE — Progress Notes (Signed)
Virtual Visit via Video Note   This visit type was conducted due to national recommendations for restrictions regarding the COVID-19 Pandemic (e.g. social distancing) in an effort to limit this patient's exposure and mitigate transmission in our community.  Due to her co-morbid illnesses, this patient is at least at moderate risk for complications without adequate follow up.  This format is felt to be most appropriate for this patient at this time.  All issues noted in this document were discussed and addressed.  A limited physical exam was performed with this format.  Please refer to the patient's chart for her consent to telehealth for [facility].   Evaluation Performed:  Cardiology Consult  This visit type was conducted due to national recommendations for restrictions regarding the COVID-19 Pandemic (e.g. social distancing).  This format is felt to be most appropriate for this patient at this time.  All issues noted in this document were discussed and addressed.  No physical exam was performed (except for noted visual exam findings with Video Visits).  Please refer to the patient's chart (MyChart message for video visits and phone note for telephone visits) for the patient's consent to telehealth for [facility].  Date:  02/10/2019   ID:  Summer [name], DOB 1950-10-21, MRN [id number]  Patient Location:  Home  Provider location:   [place]  PCP:  Hoyt Koch, MD and Truitt Merle, MD  Cardiologist:  NEW Electrophysiologist:  None   Chief Complaint:  Breast CA  History of Present Illness:    Summer [name] is a 68 y.o. female who presents via audio/video conferencing for a telehealth visit today.  This is a 68yo AAF who was diagnosed with no cardiac problems in the past and has never smoked.  Her grandmother on her mom's side had a dx of heart problems but she does not know what it was.  She is here today for followup and is doing well.  She denies any  PND, orthopnea,  palpitations or syncope. She says that through the years she may once in a while notice some tightness in the midsternal area of her chest that is nonexertional and usually occurs when she gets emotionally upset and sometimes when she eats.  She had this as far back as when she was a teenager and would get burning in her chest when she would run.  She exercises and never has any chest discomfort.  The pain she gets now is midsternal with no radiation but is associated with SOB.  There is no associated nausea or diaphoresis.  She is compliant with her meds and is tolerating meds with no SE.  She also gets occasional LE edema over the past year mainly in her left leg on and off over the past year.  She uses table salt and also salts her food when she cooks.  She has felt dizziness some since starting chemo.   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  She started chemo 5 weeks ago and will have a total of 12 weeks of chemo and then XRT.  Her chemo session was held due to 10lb weight loss.  She tells me she is restarting chemo at the end of this week.  Dr. 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  Her baseline 2D echocardiogram 12/22/2018 showed normal LV function with EF 60 to  65% and moderate enlargement of the left atrium.  Global longitudinal strain was -15.2%.  The patient does not have symptoms concerning for COVID-19 infection (fever, chills, cough, or new shortness of breath).    Prior CV studies:   The following studies were reviewed today:  2D echo 12/2018  Past Medical History:  Diagnosis Date  . Abdominal pain, left lower quadrant 11/09/2009   Qualifier: Diagnosis of  By: Valma Cava LPN, Izora Gala    . Allergic rhinitis 08/26/2008   Qualifier: Diagnosis of  By: Sherren Mocha MD, Jory Ee   . Anemia, iron deficiency 11/21/2018  . Arthritis  shoulders and back  . BACK PAIN, CHRONIC 06/13/2007   Qualifier: Diagnosis of  By: Sherren Mocha MD, Jory Ee   . 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 Constipation 07/01/2018  . Costochondritis 05/04/2014  . Diverticulitis of colon 06/13/2007   S/p segmental colectomy for perforated diverticulitis   . Diverticulosis   . Facial pain 10/06/2015  . Fibromyalgia   . Foot sprain, left, initial encounter 08/31/2016  . Glossitis 02/02/2016  . Headache 04/24/2016  . HEMATURIA UNSPECIFIED 06/25/2008   Qualifier: Diagnosis of  By: Sherren Mocha MD, Jory Ee   . HEMATURIA, HX OF 11/14/2009   Qualifier: Diagnosis of  By: Regino Schultze CMA (AAMA), Apolonio Schneiders    . History of kidney stones   . History of kidney stones   . History of shingles 2012--  no residual pain  . Itching 10/13/2018  . Left eye pain 07/01/2018  . Left ureteral calculus   . LLQ pain 04/24/2016  . LOC OSTEOARTHROS NOT SPEC PRIM/SEC OTH [facility] SITE 06/27/2009   Qualifier: Diagnosis of  By: Sherren Mocha MD, Jory Ee   . Malignant neoplasm of upper-outer quadrant of right breast in female, estrogen receptor positive ([place]) 11/17/2018  . MCI (mild cognitive impairment)   . Memory loss 12/21/2016  . Morbid obesity ([place]) 11/09/2010  . Pain of right side of body 08/31/2016  . Pruritus 10/26/2016  . Right sided temporal headache 12/21/2016  . Tremor 12/21/2016  . Trigeminal neuralgia RIGHT  . Trigeminal neuralgia of right side of face 11/30/2010  . Trochanteric bursitis, right hip 10/26/2016  . Urgency of urination   . Weight loss 07/22/2017  . Zoster without complications 8/65/7846   Past Surgical History:  Procedure Laterality Date  . BREAST LUMPECTOMY WITH RADIOACTIVE SEED AND SENTINEL LYMPH NODE BIOPSY Right 11/28/2018   Procedure: RIGHT BREAST LUMPECTOMY WITH RADIOACTIVE SEED AND RIGHT AXILLARY SENTINEL LYMPH NODE BIOPSY;  Surgeon: Excell Seltzer, MD;  Location: [place];  Service: General;  Laterality: Right;  . CEREBRAL  MICROVASCULAR DECOMPRESSION  07-05-2006   RIGHT TRIGEMINAL NERVE  . CYSTOSCOPY/RETROGRADE/URETEROSCOPY/STONE EXTRACTION WITH BASKET  07/04/2012   Procedure: CYSTOSCOPY/RETROGRADE/URETEROSCOPY/STONE EXTRACTION WITH BASKET;  Surgeon: Claybon Jabs, MD;  Location: [facility];  Service: Urology;  Laterality: Left;  . EXPLORATORY LAPAROTOMY/ RESECTION MID TO DISTAL SIGMOID AND PROXIMAL RECTUM/ END PROXIMAL SIGMOID COLOSTOMY  07-17-2006   PERFORATED DIVERTICULITIS WITH PERITONITIS  . gamma knife  05/17/2016   for trigeminal neuralgia, [facility], Dr Salomon Fick  . gamma knife  2019  . PORTACATH PLACEMENT Left 11/28/2018   Procedure: INSERTION PORT-A-CATH WITH ULTRASOUND;  Surgeon: Excell Seltzer, MD;  Location: [place];  Service: General;  Laterality: Left;  . RESECTION COLOSTOMY/ CLOSURE COLOSTOMY WITH COLOPROCTOSTOMY  02-20-2007  . TONSILLECTOMY  AS CHILD  . URETEROSCOPY  07/04/2012   Procedure: URETEROSCOPY;  Surgeon: Claybon Jabs,  MD;  Location: [place];  Service: Urology;  Laterality: Left;  Marland Kitchen VAGINAL HYSTERECTOMY  1998   Partial  . WISDOM TOOTH EXTRACTION       Current Meds  Medication Sig  . acetaminophen (TYLENOL) 500 MG tablet Take 1,000 mg by mouth every 6 (six) hours as needed (pain).   Marland Kitchen gabapentin (NEURONTIN) 600 MG tablet TAKE 2 TABLETS TID. MAX OF SIX TABS PER DAY. (Patient taking differently: Take 1,200 mg by mouth 3 (three) times daily. TAKE 2 TABLETS TID. MAX OF SIX TABS PER DAY.)  . lidocaine-prilocaine (EMLA) cream Apply to affected area once  . methocarbamol (ROBAXIN) 500 MG tablet Take 1 tablet (500 mg total) by mouth at bedtime as needed for muscle spasms.  Marland Kitchen omeprazole (PRILOSEC) 40 MG capsule Take 1 capsule (40 mg total) by mouth daily.  . ondansetron (ZOFRAN) 8 MG tablet Take 1 tablet (8 mg total) by mouth 2 (two) times daily as needed (Nausea or vomiting).  Marland Kitchen oxyCODONE (OXY IR/ROXICODONE) 5 MG immediate release tablet Take 1 tablet (5  mg total) by mouth every 6 (six) hours as needed for moderate pain, severe pain or breakthrough pain.  Marland Kitchen prochlorperazine (COMPAZINE) 10 MG tablet Take 1 tablet (10 mg total) by mouth every 6 (six) hours as needed (Nausea or vomiting).     Allergies:   Clindamycin hcl   Social History   Tobacco Use  . Smoking status: Never Smoker  . Smokeless tobacco: Never Used  Substance Use Topics  . Alcohol use: Yes    Comment: rarely  . Drug use: No     Family Hx: The patient's family history includes Breast cancer in her maternal aunt; Diabetes in her mother; Heart disease in her maternal grandmother; Lupus in her cousin. There is no history of Colon cancer, Esophageal cancer, Rectal cancer, or Stomach cancer.  ROS:   Please see the history of present illness.     All other systems reviewed and are negative.   Labs/Other Tests and Data Reviewed:    Recent Labs: 02/03/2019: ALT 20; BUN 11; Creatinine 0.75; Hemoglobin 12.1; Platelet Count 313; Potassium 4.4; Sodium 141   Recent Lipid Panel Lab Results  Component Value Date/Time   CHOL 203 (H) 07/22/2017 09:27 AM   TRIG 43.0 07/22/2017 09:27 AM   HDL 78.10 07/22/2017 09:27 AM   CHOLHDL 3 07/22/2017 09:27 AM   LDLCALC 117 (H) 07/22/2017 09:27 AM   LDLDIRECT 141.1 10/01/2013 08:20 AM    Wt Readings from Last 3 Encounters:  02/10/19 183 lb (83 kg)  02/03/19 180 lb 14.4 oz (82.1 kg)  01/30/19 181 lb 12 oz (82.4 kg)     Objective:    Vital Signs:  BP 114/61   Pulse 84   Ht 5\' 6"  (1.676 m)   Wt 183 lb (83 kg)   BMI 29.54 kg/m    CONSTITUTIONAL:  Well nourished, well developed female in no acute distress.  EYES: anicteric MOUTH: oral mucosa is pink RESPIRATORY: Normal respiratory effort, symmetric expansion CARDIOVASCULAR: No peripheral edema SKIN: No rash, lesions or ulcers MUSCULOSKELETAL: no digital cyanosis NEURO: Cranial Nerves II-XII grossly intact, moves all extremities PSYCH: Intact judgement and insight.  A&O x 3,  Mood/affect appropriate   ASSESSMENT & PLAN:    1.  Breast Cancer - she is s/p lumpectomy for stage Ia estrogen receptive breast CA.  She has started chemotherapy and is now finished her fifth dose.  She is scheduled to receive well doses total.  Her baseline echo showed normal LV function with EF 60 to 65% with a global longitudinal strain of -15.2%.  I am going to repeat a 2D echo in 2 months to make sure that this is stable.  This will be 3 months out from when she initiated chemotherapy.  If that is stable then we will plan another 2D echo after she is finished chemotherapy.  2.  Chest pain - she describes some vague chest pain that seems to be associated with shortness of breath.  She says she has had chest discomfort all through her life and even when she was a teenager when she would run she get burning in her chest.  She states that this pain is midsternal with no radiation but does cause her to develop shortness of breath.  It can come on with emotional stress as well as exertion.  She has never smoked but does have a family history of heart disease although at a later age in life.  I have recommended a coronary CTA to rule out underlying CAD.  This will also give [place] a calcium score.  3.  Lower extremity edema -this is intermittent and likely related to non-discretionary use of sodium in her diet.  She does salt her food when she is cooking.  I have asked her to follow a 2 g sodium diet and not salt her food when she is cooking.  4.  COVID-19 Education: The signs and symptoms of COVID-19 were discussed with the patient and how to seek care for testing (follow up with PCP or arrange E-visit).  The importance of social distancing was discussed today.  Patient Risk:   After full review of this patient's clinical status, I feel that they are at least moderate risk at this time.  Time:   Today, I have spent 25 minutes directly with the patient on telephone discussing medical problems including  2D echo, effects of chemo on LVF, CP and edema.  We also reviewed the symptoms of COVID 19 and the ways to protect against contracting the virus with telehealth technology.  I spent an additional 5 minutes reviewing patient's chart including office notes from oncology and 2D echo.  Medication Adjustments/Labs and Tests Ordered: Current medicines are reviewed at length with the patient today.  Concerns regarding medicines are outlined above.  Tests Ordered: No orders of the defined types were placed in this encounter.  Medication Changes: No orders of the defined types were placed in this encounter.   Disposition:  Follow up in 3 month(s)  Signed, Fransico Him, MD  02/10/2019 9:55 AM     [facility]

## 2019-02-10 ENCOUNTER — Other Ambulatory Visit: Payer: Self-pay

## 2019-02-10 ENCOUNTER — Encounter: Payer: Self-pay | Admitting: Cardiology

## 2019-02-10 ENCOUNTER — Telehealth (INDEPENDENT_AMBULATORY_CARE_PROVIDER_SITE_OTHER): Payer: Federal, State, Local not specified - PPO | Admitting: Cardiology

## 2019-02-10 VITALS — BP 114/61 | HR 84 | Ht 66.0 in | Wt 183.0 lb

## 2019-02-10 DIAGNOSIS — Z09 Encounter for follow-up examination after completed treatment for conditions other than malignant neoplasm: Secondary | ICD-10-CM

## 2019-02-10 DIAGNOSIS — R600 Localized edema: Secondary | ICD-10-CM | POA: Insufficient documentation

## 2019-02-10 DIAGNOSIS — R079 Chest pain, unspecified: Secondary | ICD-10-CM | POA: Insufficient documentation

## 2019-02-10 DIAGNOSIS — R0789 Other chest pain: Secondary | ICD-10-CM | POA: Insufficient documentation

## 2019-02-10 DIAGNOSIS — C50411 Malignant neoplasm of upper-outer quadrant of right female breast: Secondary | ICD-10-CM | POA: Diagnosis not present

## 2019-02-10 DIAGNOSIS — Z170 Estrogen receptor positive status [ER+]: Secondary | ICD-10-CM

## 2019-02-10 MED ORDER — METOPROLOL TARTRATE 100 MG PO TABS: ORAL_TABLET | ORAL | 0 refills | Status: DC

## 2019-02-10 NOTE — Patient Instructions (Addendum)
Medication Instructions:  Your physician recommends that you continue on your current medications as directed. Please refer to the Current Medication list given to you today.  If you need a refill on your cardiac medications before your next appointment, please call your pharmacy.   Lab work: None If you have labs (blood work) drawn today and your tests are completely normal, you will receive your results only by: Marland Kitchen MyChart Message (if you have MyChart) OR . A paper copy in the mail If you have any lab test that is abnormal or we need to change your treatment, we will call you to review the results.  Testing/Procedures: Your physician has requested that you have an echocardiogram. Echocardiography is a painless test that uses sound waves to create images of your heart. It provides your doctor with information about the size and shape of your heart and how well your heart's chambers and valves are working. This procedure takes approximately one hour. There are no restrictions for this procedure.  Your physician has requested that you have cardiac CT. Cardiac computed tomography (CT) is a painless test that uses an x-ray machine to take clear, detailed pictures of your heart. For further information please visit [email or web address]. Please follow instruction sheet as given.  Follow-Up: At [facility], you and your health needs are our priority.  As part of our continuing mission to provide you with exceptional heart care, we have created designated Provider Care Teams.  These Care Teams include your primary Cardiologist (physician) and Advanced Practice Providers (APPs -  Physician Assistants and Nurse Practitioners) who all work together to provide you with the care you need, when you need it. You will need a follow up appointment in 6 months.  Please call our office 2 months in advance to schedule this appointment.  You may see Dr. Radford Pax or one of the following Advanced Practice Providers on  your designated Care Team:   [place], PA-C Melina Copa, PA-C . Ermalinda Barrios, PA-C  Cardiac CT Instructions   Please arrive at the [facility] main entrance of [facility] at xx:xx AM (30-45 minutes prior to test start time)  [facility] [place], [place] [address] [phone]  Proceed to the [facility] Radiology Department (First Floor).  Please follow these instructions carefully (unless otherwise directed):  On the Night Before the Test: . Be sure to Drink plenty of water. . Do not consume any caffeinated/decaffeinated beverages or chocolate 12 hours prior to your test. . Do not take any antihistamines 12 hours prior to your test. . If you take Metformin do not take 24 hours prior to test. On the Day of the Test: . Drink plenty of water. Do not drink any water within one hour of the test. . Do not eat any food 4 hours prior to the test. . You may take your regular medications prior to the test.  . Take metoprolol (Lopressor) 100 mg, two hours prior to test.  After the Test: . Drink plenty of water. . After receiving IV contrast, you may experience a mild flushed feeling. This is normal. . On occasion, you may experience a mild rash up to 24 hours after the test. This is not dangerous. If this occurs, you can take Benadryl 25 mg and increase your fluid intake. . If you experience trouble breathing, this can be serious. If it is severe call 911 IMMEDIATELY. If it is mild, please call our office. . If you take  any of these medications: Glipizide/Metformin, Avandament, Glucavance, please do not take 48 hours after completing test.

## 2019-02-11 NOTE — Progress Notes (Signed)
[place]   [phone] [phone]   Clinic Follow up Note   Patient Care Team: Hoyt Koch, MD as PCP - General (Internal Medicine) Mauro Kaufmann, RN as Oncology Nurse Navigator Rockwell Germany, RN as Oncology Nurse Navigator Excell Seltzer, MD as Consulting Physician (General Surgery) Truitt Merle, MD as Consulting Physician (Hematology) Gery Pray, MD as Consulting Physician (Radiation Oncology)  Date of Service:  02/13/2019  CHIEF COMPLAINT: F/u in right breast cancer  SUMMARY OF ONCOLOGIC HISTORY: Oncology History   Cancer Staging Malignant neoplasm of upper-outer quadrant of right breast in female, estrogen receptor positive ([place]) Staging form: Breast, AJCC 8th Edition - Clinical stage from 11/10/2018: Stage IA (cT1b, cN0, cM0, G2, ER+, PR+, HER2+) - Signed by Truitt Merle, MD on 11/18/2018       Malignant neoplasm of upper-outer quadrant of right breast in female, estrogen receptor positive ([place])   11/07/2018 Mammogram    Diagnostic 11/07/18 IMPRESSION: 1. 1 x 0.7 x 0.6 cm hypoechoic mass with distortion at the 9-9:30 position of the RIGHT breast 6 cm from the nipple with distortion in the UPPER-OUTER RIGHT breast, corresponding to the mammographic abnormality. One RIGHT axillary lymph node with borderline cortical thickness. Tissue sampling of the RIGHT breast mass and borderline RIGHT axillary lymph node recommended. ADDENDUM: Patient returned today for biopsy of a single borderline thickened lymph node in the RIGHT axilla. I could not reproduce a morphologically abnormal lymph node today, possibly resolved in the interval. Today, only normal appearing lymph nodes were identified in the RIGHT axilla. As such, the axillary ultrasound-guided biopsy was canceled.    11/10/2018 Cancer Staging    Staging form: Breast, AJCC 8th Edition - Clinical stage from 11/10/2018: Stage IA (cT1b, cN0, cM0, G2, ER+, PR+, HER2+) - Signed  by Truitt Merle, MD on 11/18/2018    11/11/2018 Initial Biopsy    Diagnosis 11/11/18 Breast, right, needle core biopsy, upper outer - 9:30 o'clock position - INVASIVE DUCTAL CARCINOMA, GRADE II/III. - SEE MICROSCOPIC DESCRIPTION.    11/11/2018 Receptors her2    Results: IMMUNOHISTOCHEMICAL AND MORPHOMETRIC ANALYSIS PERFORMED MANUALLY The tumor cells are POSITIVE for Her2 (3+). Estrogen Receptor: 95%, POSITIVE, STRONG STAINING INTENSITY Progesterone Receptor: 5%, POSITIVE, STRONG STAINING INTENSITY Proliferation Marker Ki67: 20%    11/17/2018 Initial Diagnosis    Malignant neoplasm of upper-outer quadrant of right breast in female, estrogen receptor positive ([place])    11/28/2018 Surgery    RIGHT BREAST LUMPECTOMY WITH RADIOACTIVE SEED AND RIGHT AXILLARY SENTINEL LYMPH NODE BIOPSY by Dr. Excell Seltzer  11/28/18     11/28/2018 Pathology Results    Diagnosis 11/28/18  1. Breast, lumpectomy, Right w/seed - INVASIVE DUCTAL CARCINOMA, 1.6 CM, NOTTINGHAM GRADE 2 OF 3. - MARGINS OF RESECTION ARE NOT INVOLVED (CLOSEST MARGIN: LESS THAN 1 MM, ANTERIOR). - DUCTAL CARCINOMA IN SITU. - BIOPSY SITE CHANGES. - SEE ONCOLOGY TABLE. 2. Lymph node, sentinel, biopsy, right Axillary - ONE LYMPH NODE, NEGATIVE FOR CARCINOMA (0/1). 3. Lymph node, sentinel, biopsy, right Axillary - ONE LYMPH NODE, NEGATIVE FOR CARCINOMA (0/1).    11/28/2018 Cancer Staging    Staging form: Breast, AJCC 8th Edition - Pathologic stage from 11/28/2018: Stage IA (pT1c, pN0, cM0, G2, ER+, PR+, HER2+) - Signed by Truitt Merle, MD on 12/11/2018    12/22/2018 Procedure    Baseline ECHO 12/22/18  IMPRESSIONS  1. The left ventricle has normal systolic function with an ejection fraction of 60-65%. The cavity size was normal. There is mild concentric left  ventricular hypertrophy. Left ventricular diastolic parameters were normal.  2. The right ventricle has normal systolic function. The cavity was normal. There is no increase in right ventricular wall  thickness.  3. Left atrial size was moderately dilated.  4. The aortic valve is tricuspid. Aortic valve regurgitation was not assessed by color flow Doppler.     01/02/2019 - 02/13/2019 Chemotherapy    adjuvant chemo weekly Taxol with Herceptin q3weeks starting 01/02/19. Will d/c after 02/13/19 due to worsening neuropathy.          CURRENT THERAPY:  -Adjuvant chemo weekly Taxol with Herceptin starting 01/02/19. Will d/c after 02/13/19 and will switch to Kadcyla q3weeks starting 02/20/19 -IV iron as needed    INTERVAL HISTORY:  Summer [name] is here for a follow up and treatment. She presents to the clinic alone. She notes she is doing fair. She notes she feels better this week and able to gain weight. She notes her nausea and appetite is fair. She eats small portions through the day. She notes her neuropathy is present most of the time. She has tingling of finger tips and toes. She does not think this is getting worse. She is concerned her port is stinging her and very tender when she touches it at home. She did not see any opening. PA Eliseo Squires gave her Robaxin for cramping from neuropathy but she is having issues refilling. She has been taking Gabapentin 273m TID.     REVIEW OF SYSTEMS:   Constitutional: Denies fevers, chills (+) appetite fair but still able to gain weight.  Eyes: Denies blurriness of vision Ears, nose, mouth, throat, and face: Denies mucositis or sore throat Respiratory: Denies cough, dyspnea or wheezes Cardiovascular: Denies palpitation, chest discomfort or lower extremity swelling Gastrointestinal:  Denies nausea, heartburn or change in bowel habits Skin: Denies abnormal skin rashes (+) Left chest PAC very tender and stinging Lymphatics: Denies new lymphadenopathy or easy bruising Neurological: (+) Neuralgia of face (+)tingling of finger tips and toes Behavioral/Psych: Mood is stable, no new changes  All other systems were reviewed with the patient and are  negative.  MEDICAL HISTORY:  Past Medical History:  Diagnosis Date  . Abdominal pain, left lower quadrant 11/09/2009   Qualifier: Diagnosis of  By: NValma CavaLPN, NIzora Gala   . Allergic rhinitis 08/26/2008   Qualifier: Diagnosis of  By: TSherren MochaMD, JJory Ee  . Anemia, iron deficiency 11/21/2018  . Arthritis shoulders and back  . BACK PAIN, CHRONIC 06/13/2007   Qualifier: Diagnosis of  By: TSherren MochaMD, JJory Ee  . 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 Constipation 07/01/2018  . Costochondritis 05/04/2014  . Diverticulitis of colon 06/13/2007   S/p segmental colectomy for perforated diverticulitis   . Diverticulosis   . Facial pain 10/06/2015  . Fibromyalgia   . Foot sprain, left, initial encounter 08/31/2016  . Glossitis 02/02/2016  . Headache 04/24/2016  . HEMATURIA UNSPECIFIED 06/25/2008   Qualifier: Diagnosis of  By: TSherren MochaMD, JJory Ee  . HEMATURIA, HX OF 11/14/2009   Qualifier: Diagnosis of  By: VRegino SchultzeCMA (AAMA), RApolonio Schneiders   . History of kidney stones   . History of kidney stones   . History of shingles 2012--  no residual pain  . Itching 10/13/2018  . Left eye pain 07/01/2018  . Left ureteral calculus   . LLQ pain 04/24/2016  . LOC OSTEOARTHROS NOT SPEC PRIM/[facility] 06/27/2009  Qualifier: Diagnosis of  By: Sherren Mocha MD, Jory Ee Malignant neoplasm of upper-outer quadrant of right breast in female, estrogen receptor positive ([place]) 11/17/2018  . MCI (mild cognitive impairment)   . Memory loss 12/21/2016  . Morbid obesity ([place]) 11/09/2010  . Pain of right side of body 08/31/2016  . Pruritus 10/26/2016  . Right sided temporal headache 12/21/2016  . Tremor 12/21/2016  . Trigeminal neuralgia RIGHT  . Trigeminal neuralgia of right side of face 11/30/2010  . Trochanteric bursitis, right hip 10/26/2016  . Urgency of urination   . Weight loss 07/22/2017  . Zoster without complications 9/93/7169    SURGICAL HISTORY: Past Surgical History:   Procedure Laterality Date  . BREAST LUMPECTOMY WITH RADIOACTIVE SEED AND SENTINEL LYMPH NODE BIOPSY Right 11/28/2018   Procedure: RIGHT BREAST LUMPECTOMY WITH RADIOACTIVE SEED AND RIGHT AXILLARY SENTINEL LYMPH NODE BIOPSY;  Surgeon: Excell Seltzer, MD;  Location: [place];  Service: General;  Laterality: Right;  . CEREBRAL MICROVASCULAR DECOMPRESSION  07-05-2006   RIGHT TRIGEMINAL NERVE  . CYSTOSCOPY/RETROGRADE/URETEROSCOPY/STONE EXTRACTION WITH BASKET  07/04/2012   Procedure: CYSTOSCOPY/RETROGRADE/URETEROSCOPY/STONE EXTRACTION WITH BASKET;  Surgeon: Claybon Jabs, MD;  Location[facility];  Service: Urology;  Laterality: Left;  . EXPLORATORY LAPAROTOMY/ RESECTION MID TO DISTAL SIGMOID AND PROXIMAL RECTUM/ END PROXIMAL SIGMOID COLOSTOMY  07-17-2006   PERFORATED DIVERTICULITIS WITH PERITONITIS  . gamma knife  05/17/2016   for trigeminal neuralgia, [facility], Dr Salomon Fick  . gamma knife  2019  . PORTACATH PLACEMENT Left 11/28/2018   Procedure: INSERTION PORT-A-CATH WITH ULTRASOUND;  Surgeon: Excell Seltzer, MD;  Location: [place];  Service: General;  Laterality: Left;  . RESECTION COLOSTOMY/ CLOSURE COLOSTOMY WITH COLOPROCTOSTOMY  02-20-2007  . TONSILLECTOMY  AS CHILD  . URETEROSCOPY  07/04/2012   Procedure: URETEROSCOPY;  Surgeon: Claybon Jabs, MD;  Location: [facility];  Service: Urology;  Laterality: Left;  Marland Kitchen VAGINAL HYSTERECTOMY  1998   Partial  . WISDOM TOOTH EXTRACTION      I have reviewed the social history and family history with the patient and they are unchanged from previous note.  ALLERGIES:  is allergic to clindamycin hcl.  MEDICATIONS:  Current Outpatient Medications  Medication Sig Dispense Refill  . acetaminophen (TYLENOL) 500 MG tablet Take 1,000 mg by mouth every 6 (six) hours as needed (pain).     Marland Kitchen gabapentin (NEURONTIN) 600 MG tablet TAKE 2 TABLETS TID. MAX OF SIX TABS PER DAY. (Patient taking differently: Take 1,200 mg by mouth 3  (three) times daily. TAKE 2 TABLETS TID. MAX OF SIX TABS PER DAY.) 540 tablet 3  . lidocaine-prilocaine (EMLA) cream Apply to affected area once 30 g 3  . methocarbamol (ROBAXIN) 500 MG tablet Take 1 tablet (500 mg total) by mouth at bedtime as needed for muscle spasms. 30 tablet 0  . metoprolol tartrate (LOPRESSOR) 100 MG tablet Take 1 tablet, 100 mg, 2 hours before your cardiac CT. 1 tablet 0  . omeprazole (PRILOSEC) 40 MG capsule Take 1 capsule (40 mg total) by mouth daily. 30 capsule 5  . ondansetron (ZOFRAN) 8 MG tablet Take 1 tablet (8 mg total) by mouth 2 (two) times daily as needed (Nausea or vomiting). 30 tablet 1  . oxyCODONE (OXY IR/ROXICODONE) 5 MG immediate release tablet Take 1 tablet (5 mg total) by mouth every 6 (six) hours as needed for moderate pain, severe pain or breakthrough pain. 10 tablet 0  . prochlorperazine (COMPAZINE) 10 MG tablet Take 1 tablet (  10 mg total) by mouth every 6 (six) hours as needed (Nausea or vomiting). 30 tablet 1   No current facility-administered medications for this visit.     PHYSICAL EXAMINATION: ECOG PERFORMANCE STATUS: 1 - Symptomatic but completely ambulatory  Vitals:   02/13/19 1030  BP: (!) 128/55  Pulse: 88  Resp: 18  Temp: 97.6 F (36.4 C)  SpO2: 100%   Filed Weights   02/13/19 1030  Weight: 186 lb 14.4 oz (84.8 kg)    GENERAL:alert, no distress and comfortable SKIN: skin color, texture, turgor are normal, no rashes or significant lesions (+) Mild tenderness of left chest PAC, no evidence of open wound  EYES: normal, Conjunctiva are pink and non-injected, sclera clear  NECK: supple, thyroid normal size, non-tender, without nodularity LYMPH:  no palpable lymphadenopathy in the cervical, axillary  LUNGS: clear to auscultation and percussion with normal breathing effort HEART: regular rate & rhythm and no murmurs and no lower extremity edema ABDOMEN:abdomen soft, non-tender and normal bowel sounds Musculoskeletal:no cyanosis of  digits and no clubbing  NEURO: alert & oriented x 3 with fluent speech, no focal motor/sensory deficits  LABORATORY DATA:  I have reviewed the data as listed CBC Latest Ref Rng & Units 02/13/2019 02/03/2019 01/30/2019  WBC 4.0 - 10.5 K/uL 5.7 3.7(L) 3.1(L)  Hemoglobin 12.0 - 15.0 g/dL 12.0 12.1 12.0  Hematocrit 36.0 - 46.0 % 39.6 39.8 39.0  Platelets 150 - 400 K/uL 349 313 334     CMP Latest Ref Rng & Units 02/13/2019 02/03/2019 01/30/2019  Glucose 70 - 99 mg/dL 111(H) 103(H) 109(H)  BUN 8 - 23 mg/dL 8 11 15   Creatinine 0.44 - 1.00 mg/dL 0.78 0.75 0.81  Sodium 135 - 145 mmol/L 143 141 142  Potassium 3.5 - 5.1 mmol/L 4.0 4.4 4.1  Chloride 98 - 111 mmol/L 107 105 105  CO2 22 - 32 mmol/L 28 30 29   Calcium 8.9 - 10.3 mg/dL 9.1 9.1 9.7  Total Protein 6.5 - 8.1 g/dL 6.5 6.9 6.9  Total Bilirubin 0.3 - 1.2 mg/dL 0.4 0.3 0.4  Alkaline Phos 38 - 126 U/L 85 73 74  AST 15 - 41 U/L 20 23 18   ALT 0 - 44 U/L 18 20 15       RADIOGRAPHIC STUDIES: I have personally reviewed the radiological images as listed and agreed with the findings in the report. No results found.   ASSESSMENT & PLAN:  Summer [name] is a 68 y.o. female with   1.Malignant neoplasm of upper-outer quadrant of right breast, StageIA, pT1b,N0,M0, ER/PR+, HER2+, GradeII -She was recently diagnosed in 11/2018. She is s/p right breastlumpectomyand SLNB as of 11/28/18.  -Her pathology results showed her small grade II tumor was completely resected with negative margins and node negative. -She startedadjuvantweekly Taxol with Herceptinon4/24/20. Her side effects (fatigue, taste change, weight loss, neuropathy, skin change) continue to build up as chemo continues, chemo was held last week and she felt better  -Will continue Taxol today but given the risk of developing worsening neuropathy, I will switch to TDM-1 Kadcyla q3weeks, if she tolerates well will continue TDM-1 until 12/2019 but I will switch it to Herceptin if she  has poor tolerance  --Chemotherapy consent: Side effects including but does not not limited to, fatigue, nausea, vomiting, diarrhea, neuropathy, fluid retention, renal and kidney dysfunction, neutropenic fever, needed for blood transfusion, bleeding, heart failure, were discussed with patient in great detail. She agrees to proceed next week -Labs reviewed, CBC and CMP WNL  except BG 111. Overall adequate to proceed with Taxol and Herceptin today.  -F/u 1 week   2. Anemia,iron deficiency -Pt notes she has been anemic in the past year -Last colonoscopy was in 04/2012 which wasunremarkable except diverticulosis.  -She had partial colectomy in the past due to severe diverticulosis. -This is possiblyiron deficiency from diverticulosis.She denies any current bleeding.  -After COVID-19 improvesand her chemo treatmentI will refer herbacktoDr. Gessnerfor colonoscopy -Her workup showed iron 17, ferritin 4, normal retic ct, folate Vitamin B12 and SPEP panels. -IV iron was given 3/18/20and 12/12/18.  -will monitor her iron level every month -Hg normalizedrecently    3.Trigeminal neuralgia  -on Gabapentin 271m TID  -She has been having moderate right side Has which I suspect is exacerbation of her neuralgia secondary to chemo. Will monitor.  -She has oxycodone which she can use for significant pain. -02/03/19 brain and neck MRI were stable.    4. Peripheral Neuropathy  -Secondary to chemoTaxol started after cycle 3.  -I discussed neuropathy can progress with more treatment. Will monitor  -PA Vannpreviouslystarted her on Robaxin. She can take Tylenol in between for breakthrough pain. -Her neuropathy has worsened, mainly in her left toes. Mildly in her fingertips.  -She will continue to use ice bags during infusions.I encouraged her to start B complex.  -Starting 02/20/19 I will start her on TDM-1 Kadcyla.    5. Low appetite and weight loss  -Secondary to chemo she has had  taste change resulting in low appetite and weight loss -I encouraged her to increase her nutritional supplements to 3-4 a day  -Her appetite is fair but she continues to eat adequately and able to gain weight.  -I will set up dietician consult.  6. PAC Tenderness and stinging -No evidence of open wound or infection on exam today (02/13/19) -I discussed this could be skin breakdown in that area and she should watch it and keep it clean.    PLAN: -I will refill Robaxin today  -Labs reviewed and adequate to proceed with Taxol and Herceptin today  -Please cancel future appointments  -Lab, flush, f/u and chemo Kadcyla in 1, 4 and 7 weeks    No problem-specific Assessment & Plan notes found for this encounter.   No orders of the defined types were placed in this encounter.  All questions were answered. The patient knows to call the clinic with any problems, questions or concerns. No barriers to learning was detected. I spent 20 minutes counseling the patient face to face. The total time spent in the appointment was 25 minutes and more than 50% was on counseling and review of test results     YTruitt Merle MD 02/13/2019   I, AJoslyn Devon am acting as scribe for YTruitt Merle MD.   I have reviewed the above documentation for accuracy and completeness, and I agree with the above.

## 2019-02-12 ENCOUNTER — Telehealth: Payer: Self-pay | Admitting: Cardiology

## 2019-02-12 NOTE — Progress Notes (Signed)
MD reloading Hercepin on 6/5 due to missed Herceptin/Taxol last week.  Infuse dose over 30 minutes.Orders changed as requested.  Hardie Pulley, PharmD, BCPS, BCOP

## 2019-02-12 NOTE — Telephone Encounter (Signed)
Follow Up:    Returning Summer [name]'s call from yesterday.

## 2019-02-12 NOTE — Telephone Encounter (Signed)
Pt asking about CT.  States [place] sent her some information via MyChart and a "single pill" was sent to pharmacy. Pt informed that pill was to take prior to CT testing. Pt aware Suezanne Jacquet will follow up with her tomorrow about when this will occur and further instructions.

## 2019-02-13 ENCOUNTER — Other Ambulatory Visit: Payer: Self-pay

## 2019-02-13 ENCOUNTER — Inpatient Hospital Stay (HOSPITAL_BASED_OUTPATIENT_CLINIC_OR_DEPARTMENT_OTHER): Payer: Federal, State, Local not specified - PPO | Admitting: Medical

## 2019-02-13 ENCOUNTER — Inpatient Hospital Stay: Payer: Federal, State, Local not specified - PPO

## 2019-02-13 ENCOUNTER — Inpatient Hospital Stay: Payer: Federal, State, Local not specified - PPO | Attending: Hematology

## 2019-02-13 ENCOUNTER — Other Ambulatory Visit: Payer: Federal, State, Local not specified - PPO

## 2019-02-13 ENCOUNTER — Inpatient Hospital Stay: Payer: Federal, State, Local not specified - PPO | Admitting: Hematology

## 2019-02-13 ENCOUNTER — Telehealth: Payer: Self-pay | Admitting: Nutrition

## 2019-02-13 ENCOUNTER — Encounter: Payer: Self-pay | Admitting: Hematology

## 2019-02-13 VITALS — BP 128/55 | HR 88 | Temp 97.6°F | Resp 18 | Ht 66.0 in | Wt 186.9 lb

## 2019-02-13 VITALS — BP 129/63 | HR 74 | Resp 17

## 2019-02-13 DIAGNOSIS — Z170 Estrogen receptor positive status [ER+]: Secondary | ICD-10-CM | POA: Diagnosis not present

## 2019-02-13 DIAGNOSIS — M62838 Other muscle spasm: Secondary | ICD-10-CM

## 2019-02-13 DIAGNOSIS — M791 Myalgia, unspecified site: Secondary | ICD-10-CM

## 2019-02-13 DIAGNOSIS — G620 Drug-induced polyneuropathy: Secondary | ICD-10-CM | POA: Insufficient documentation

## 2019-02-13 DIAGNOSIS — Z79899 Other long term (current) drug therapy: Secondary | ICD-10-CM

## 2019-02-13 DIAGNOSIS — R634 Abnormal weight loss: Secondary | ICD-10-CM | POA: Diagnosis not present

## 2019-02-13 DIAGNOSIS — D509 Iron deficiency anemia, unspecified: Secondary | ICD-10-CM | POA: Insufficient documentation

## 2019-02-13 DIAGNOSIS — T451X5S Adverse effect of antineoplastic and immunosuppressive drugs, sequela: Secondary | ICD-10-CM

## 2019-02-13 DIAGNOSIS — C50411 Malignant neoplasm of upper-outer quadrant of right female breast: Secondary | ICD-10-CM | POA: Insufficient documentation

## 2019-02-13 DIAGNOSIS — Z5111 Encounter for antineoplastic chemotherapy: Secondary | ICD-10-CM | POA: Diagnosis not present

## 2019-02-13 DIAGNOSIS — L539 Erythematous condition, unspecified: Secondary | ICD-10-CM | POA: Insufficient documentation

## 2019-02-13 DIAGNOSIS — M797 Fibromyalgia: Secondary | ICD-10-CM | POA: Diagnosis not present

## 2019-02-13 DIAGNOSIS — T8090XA Unspecified complication following infusion and therapeutic injection, initial encounter: Secondary | ICD-10-CM

## 2019-02-13 DIAGNOSIS — Z5112 Encounter for antineoplastic immunotherapy: Secondary | ICD-10-CM | POA: Insufficient documentation

## 2019-02-13 DIAGNOSIS — G500 Trigeminal neuralgia: Secondary | ICD-10-CM | POA: Diagnosis not present

## 2019-02-13 DIAGNOSIS — Z95828 Presence of other vascular implants and grafts: Secondary | ICD-10-CM

## 2019-02-13 LAB — CMP (CANCER CENTER ONLY)
ALT: 18 U/L (ref 0–44)
AST: 20 U/L (ref 15–41)
Albumin: 3.5 g/dL (ref 3.5–5.0)
Alkaline Phosphatase: 85 U/L (ref 38–126)
Anion gap: 8 (ref 5–15)
BUN: 8 mg/dL (ref 8–23)
CO2: 28 mmol/L (ref 22–32)
Calcium: 9.1 mg/dL (ref 8.9–10.3)
Chloride: 107 mmol/L (ref 98–111)
Creatinine: 0.78 mg/dL (ref 0.44–1.00)
GFR, Est AFR Am: >60 mL/min (ref >60)
GFR, Estimated: >60 mL/min (ref >60)
Glucose, Bld: 111 mg/dL — ABNORMAL HIGH (ref 70–99)
Potassium: 4 mmol/L (ref 3.5–5.1)
Sodium: 143 mmol/L (ref 135–145)
Total Bilirubin: 0.4 mg/dL (ref 0.3–1.2)
Total Protein: 6.5 g/dL (ref 6.5–8.1)

## 2019-02-13 LAB — CBC WITH DIFFERENTIAL (CANCER CENTER ONLY)
Abs Immature Granulocytes: 0.07 10*3/uL (ref 0.00–0.07)
Basophils Absolute: 0.1 10*3/uL (ref 0.0–0.1)
Basophils Relative: 1 %
Eosinophils Absolute: 0.1 10*3/uL (ref 0.0–0.5)
Eosinophils Relative: 3 %
HCT: 39.6 % (ref 36.0–46.0)
Hemoglobin: 12 g/dL (ref 12.0–15.0)
Immature Granulocytes: 1 %
Lymphocytes Relative: 30 %
Lymphs Abs: 1.7 10*3/uL (ref 0.7–4.0)
MCH: 28.1 pg (ref 26.0–34.0)
MCHC: 30.3 g/dL (ref 30.0–36.0)
MCV: 92.7 fL (ref 80.0–100.0)
Monocytes Absolute: 0.7 10*3/uL (ref 0.1–1.0)
Monocytes Relative: 12 %
Neutro Abs: 3 10*3/uL (ref 1.7–7.7)
Neutrophils Relative %: 53 %
Platelet Count: 349 10*3/uL (ref 150–400)
RBC: 4.27 MIL/uL (ref 3.87–5.11)
RDW: 18.3 % — ABNORMAL HIGH (ref 11.5–15.5)
WBC Count: 5.7 10*3/uL (ref 4.0–10.5)
nRBC: 0 % (ref 0.0–0.2)

## 2019-02-13 MED ORDER — SODIUM CHLORIDE 0.9 % IV SOLN
Freq: Once | INTRAVENOUS | Status: AC
  Administered 2019-02-13: 13:00:00 via INTRAVENOUS
  Filled 2019-02-13: qty 250

## 2019-02-13 MED ORDER — FAMOTIDINE IN NACL 20-0.9 MG/50ML-% IV SOLN
20.0000 mg | Freq: Once | INTRAVENOUS | Status: AC
  Administered 2019-02-13: 20 mg via INTRAVENOUS

## 2019-02-13 MED ORDER — DEXAMETHASONE SODIUM PHOSPHATE 10 MG/ML IJ SOLN
10.0000 mg | Freq: Once | INTRAMUSCULAR | Status: AC
  Administered 2019-02-13: 10 mg via INTRAVENOUS

## 2019-02-13 MED ORDER — DIPHENHYDRAMINE HCL 50 MG/ML IJ SOLN
50.0000 mg | Freq: Once | INTRAMUSCULAR | Status: AC
  Administered 2019-02-13: 50 mg via INTRAVENOUS

## 2019-02-13 MED ORDER — METHOCARBAMOL 500 MG PO TABS: 500.0000 mg | ORAL_TABLET | Freq: Every evening | ORAL | 1 refills | Status: DC | PRN

## 2019-02-13 MED ORDER — HEPARIN SOD (PORK) LOCK FLUSH 100 UNIT/ML IV SOLN
500.0000 [IU] | Freq: Once | INTRAVENOUS | Status: AC | PRN
  Administered 2019-02-13: 500 [IU]
  Filled 2019-02-13: qty 5

## 2019-02-13 MED ORDER — METHYLPREDNISOLONE SODIUM SUCC 125 MG IJ SOLR
125.0000 mg | Freq: Once | INTRAMUSCULAR | Status: AC | PRN
  Administered 2019-02-13: 125 mg via INTRAVENOUS

## 2019-02-13 MED ORDER — MORPHINE SULFATE (PF) 4 MG/ML IV SOLN
INTRAVENOUS | Status: AC
  Filled 2019-02-13: qty 1

## 2019-02-13 MED ORDER — TRASTUZUMAB CHEMO 150 MG IV SOLR
4.0000 mg/kg | Freq: Once | INTRAVENOUS | Status: AC
  Administered 2019-02-13: 336 mg via INTRAVENOUS
  Filled 2019-02-13: qty 16

## 2019-02-13 MED ORDER — SODIUM CHLORIDE 0.9% FLUSH
10.0000 mL | INTRAVENOUS | Status: DC | PRN
  Administered 2019-02-13: 10 mL
  Filled 2019-02-13: qty 10

## 2019-02-13 MED ORDER — SODIUM CHLORIDE 0.9% FLUSH
10.0000 mL | Freq: Once | INTRAVENOUS | Status: AC
  Administered 2019-02-13: 10 mL
  Filled 2019-02-13: qty 10

## 2019-02-13 MED ORDER — FAMOTIDINE IN NACL 20-0.9 MG/50ML-% IV SOLN
INTRAVENOUS | Status: AC
  Filled 2019-02-13: qty 50

## 2019-02-13 MED ORDER — DIPHENHYDRAMINE HCL 50 MG/ML IJ SOLN
INTRAMUSCULAR | Status: AC
  Filled 2019-02-13: qty 1

## 2019-02-13 MED ORDER — DEXAMETHASONE SODIUM PHOSPHATE 10 MG/ML IJ SOLN
INTRAMUSCULAR | Status: AC
  Filled 2019-02-13: qty 1

## 2019-02-13 MED ORDER — SODIUM CHLORIDE 0.9 % IV SOLN
80.0000 mg/m2 | Freq: Once | INTRAVENOUS | Status: AC
  Administered 2019-02-13: 162 mg via INTRAVENOUS
  Filled 2019-02-13: qty 27

## 2019-02-13 MED ORDER — ACETAMINOPHEN 325 MG PO TABS
ORAL_TABLET | ORAL | Status: AC
  Filled 2019-02-13: qty 2

## 2019-02-13 MED ORDER — ACETAMINOPHEN 325 MG PO TABS
650.0000 mg | ORAL_TABLET | Freq: Once | ORAL | Status: AC
  Administered 2019-02-13: 650 mg via ORAL

## 2019-02-13 MED ORDER — MORPHINE SULFATE 4 MG/ML IJ SOLN
2.0000 mg | Freq: Once | INTRAMUSCULAR | Status: AC
  Administered 2019-02-13: 2 mg via INTRAVENOUS
  Filled 2019-02-13: qty 1

## 2019-02-13 NOTE — Progress Notes (Signed)
1428: Pt c/o chest pain, shortness of breath and lower back pain. Pt noted to be grabbing at her chest and short of breath. Redness noted to chest and neck. Pt given verbal reassurance.  Taxol infusion stopped and [place], [place] notified of pt reaction.  Oxygen placed on patient at 2 liters via nasal cannula.  Vitals obtained and stable.  1430Lucianne Lei, PA at bedside and orders received. Pt medicated per orders; refer to [facility].    1438: EKG obtained and given to [place], [place]: Pt denies any pain or shortness of breath. Respirations equal and unlabored. Skin warm and dry. Pt denies any complaints. Vitals updated and stable.  1448Lucianne Lei, PA at bedside. Pt denies any pain or complaints. Pt aware of plan to discontinue Taxol for this treatment and agreeable to plan. Pt educated on reaction to medication by Lucianne Lei, [place]. Pt had no further questions and verbalized understanding.  1530: Pt ambulated to bathroom with even and steady gait. Pt denied any complaints. Pt aware to call with any questions or concerns. Understanding verbalized by patient. Pt left clinic in no apparent distress.

## 2019-02-13 NOTE — Patient Instructions (Signed)

## 2019-02-13 NOTE — Addendum Note (Signed)
Addended by: Truitt Merle on: 02/13/2019 11:16 AM   Modules accepted: Orders

## 2019-02-13 NOTE — Progress Notes (Signed)
    DATE:  02/13/2019                                          X  CHEMO/IMMUNOTHERAPY REACTION             MD:  Dr. Truitt Merle   AGENT/BLOOD PRODUCT RECEIVING TODAY:               trastuzumab and paclitaxel   AGENT/BLOOD PRODUCT RECEIVING IMMEDIATELY PRIOR TO REACTION:           Paclitaxel    VS: BP:      162/64   P:        85       SPO2:        95% on 2 L via nasal cannula                BP:      138/77   P:        76       SPO2:        100% on 2 L via nasal cannula     REACTION(S):            Flushing, erythema, chest pain, and back pain   PREMEDS:      Benadryl, Pepcid, Tylenol, and dexamethasone   INTERVENTION: And EKG returned showing normal sinus rhythm with left axis deviation and left ventricular hypertrophy with QRS widening.  Patient was given Solu-Medrol 125 mg IV x1 and morphine 2 mg IV x1   Review of Systems  Review of Systems  Constitutional: Negative for chills, diaphoresis and fever.  HENT: Negative for trouble swallowing and voice change.   Respiratory: Negative for cough, chest tightness, shortness of breath and wheezing.   Cardiovascular: Positive for chest pain. Negative for palpitations.  Gastrointestinal: Negative for abdominal pain, constipation, diarrhea, nausea and vomiting.  Musculoskeletal: Positive for back pain. Negative for myalgias.  Skin:        flushing and erythema  Neurological: Negative for dizziness, light-headedness and headaches.     Physical Exam  Physical Exam Constitutional:      General: She is not in acute distress.    Appearance: She is not diaphoretic.  HENT:     Head: Normocephalic and atraumatic.  Cardiovascular:     Rate and Rhythm: Normal rate and regular rhythm.     Heart sounds: Normal heart sounds. No murmur. No friction rub. No gallop.   Pulmonary:     Effort: Pulmonary effort is normal. No respiratory distress.     Breath sounds: Normal breath sounds. No wheezing or rales.  Skin:    General: Skin is warm and dry.    Findings: Erythema present. No rash.     Comments: The patient was noted to have erythema of her face, neck, chest, and upper extremities which resolved after dosing with Solu-Medrol.  Neurological:     Mental Status: She is alert.     OUTCOME:                 The patient's symptoms abated after she was given Solu-Medrol and morphine sulfate.  It was decided by Dr. Morey Hummingbird that she would not be rechallenged with the remainder of her paclitaxel today.   Sandi Mealy, MHS, PA-C  This case was discussed with Dr. Burr Medico. She expressed agreement with my management of this patient.

## 2019-02-13 NOTE — Telephone Encounter (Signed)
Contacted pt to verify telephone visit for pre reg °

## 2019-02-13 NOTE — Patient Instructions (Addendum)
[facility] Discharge Instructions for Patients Receiving Chemotherapy  Today you received the following chemotherapy agents Trastuzumab (HERCEPTIN).  To help prevent nausea and vomiting after your treatment, we encourage you to take your nausea medication as prescribed.   If you develop nausea and vomiting that is not controlled by your nausea medication, call the clinic.   BELOW ARE SYMPTOMS THAT SHOULD BE REPORTED IMMEDIATELY:  *FEVER GREATER THAN 100.5 F  *CHILLS WITH OR WITHOUT FEVER  NAUSEA AND VOMITING THAT IS NOT CONTROLLED WITH YOUR NAUSEA MEDICATION  *UNUSUAL SHORTNESS OF BREATH  *UNUSUAL BRUISING OR BLEEDING  TENDERNESS IN MOUTH AND THROAT WITH OR WITHOUT PRESENCE OF ULCERS  *URINARY PROBLEMS  *BOWEL PROBLEMS  UNUSUAL RASH Items with * indicate a potential emergency and should be followed up as soon as possible.  Feel free to call the clinic should you have any questions or concerns. The clinic phone number is [phone].  Please show the CHEMO ALERT CARD at check-in to the Emergency Department and triage nurse.   

## 2019-02-13 NOTE — Telephone Encounter (Signed)
Spoke with the patient, she expressed understanding about her Cardiac CT instructions.

## 2019-02-14 NOTE — Progress Notes (Signed)
DISCONTINUE ON PATHWAY REGIMEN - Breast   Paclitaxel Weekly + Trastuzumab Weekly:   Administer weekly:     Paclitaxel      Trastuzumab-xxxx      Trastuzumab-xxxx   **Always confirm dose/schedule in your pharmacy ordering system**  Trastuzumab (Maintenance - NO Loading Dose):   A cycle is every 21 days:     Trastuzumab-xxxx   **Always confirm dose/schedule in your pharmacy ordering system**  REASON: Toxicities / Adverse Event PRIOR TREATMENT: [id number]: Weekly Paclitaxel + Trastuzumab x 12 Weeks, Followed by Trastuzumab Maintenance q21 Days x 13 Cycles TREATMENT RESPONSE: Unable to Evaluate  START OFF PATHWAY REGIMEN - Breast   [id number]:Ado-Trastuzumab Emtansine 3.6 mg/kg IV D1 q21 Days:   A cycle is every 21 days:     Ado-trastuzumab emtansine   **Always confirm dose/schedule in your pharmacy ordering system**  Patient Characteristics: Postoperative without Neoadjuvant Therapy (Pathologic Staging), Invasive Disease, Adjuvant Therapy, HER2 Positive, ER Positive, Node Negative, pT1c, pN0/N42m Therapeutic Status: Postoperative without Neoadjuvant Therapy (Pathologic Staging) AJCC Grade: G2 AJCC N Category: pN0 AJCC M Category: cM0 ER Status: Positive (+) AJCC 8 Stage Grouping: IA HER2 Status: Positive (+) Oncotype Dx Recurrence Score: Not Appropriate AJCC T Category: pT1c PR Status: Positive (+) Intent of Therapy: Curative Intent, Discussed with Patient

## 2019-02-16 ENCOUNTER — Telehealth: Payer: Self-pay | Admitting: Hematology

## 2019-02-16 ENCOUNTER — Inpatient Hospital Stay: Payer: Federal, State, Local not specified - PPO | Admitting: Nutrition

## 2019-02-16 NOTE — Progress Notes (Signed)
RD working remotely.  68 year old female diagnosed with breast cancer in Feb 2020.  PMH includes Arthritis.  Medications include Prilosec.  Labs include Glucose 111.  Height: 66 inches. Weight: 186.9 pounds UBW: 260 pounds BMI: 30.17.  Patient reports taste alterations. Denies mouth sores, nausea, vomiting and diarrhea. Reports occasional constipation. Drinks one ONS daily.  Nutrition Diagnosis: Unintended weight loss related to breast cancer and associated treatments as evidenced by about 6% wt loss over 3 months.  Interventions: Educated patient on taste alterations and provided fact sheets. Encouraged frequent small meals and snacks. Continue oral nutrition supplements. Discouraged continued weight loss. Encouraged bowel regimen.  Monitoring, Evaluation, Goals: Patient will tolerate adequate calories and protein to minimize weight loss  Next Visit: Patient will contact RD for follow up if needed.

## 2019-02-16 NOTE — Telephone Encounter (Signed)
Scheduled appt per 6/5 los. °

## 2019-02-18 NOTE — Progress Notes (Signed)
Summer [name]   [phone] [phone]   Clinic Follow up Note   Patient Care Team: Hoyt Koch, MD as PCP - General (Internal Medicine) Mauro Kaufmann, RN as Oncology Nurse Navigator Rockwell Germany, RN as Oncology Nurse Navigator Excell Seltzer, MD as Consulting Physician (General Surgery) Truitt Merle, MD as Consulting Physician (Hematology) Gery Pray, MD as Consulting Physician (Radiation Oncology)  Date of Service:  02/20/2019  CHIEF COMPLAINT: F/u of right breast cancer   SUMMARY OF ONCOLOGIC HISTORY: Oncology History Overview Note  Cancer Staging Malignant neoplasm of upper-outer quadrant of right breast in female, estrogen receptor positive ([place]) Staging form: Breast, AJCC 8th Edition - Clinical stage from 11/10/2018: Stage IA (cT1b, cN0, cM0, G2, ER+, PR+, HER2+) - Signed by Truitt Merle, MD on 11/18/2018     Malignant neoplasm of upper-outer quadrant of right breast in female, estrogen receptor positive ([place])  11/07/2018 Mammogram   Diagnostic 11/07/18 IMPRESSION: 1. 1 x 0.7 x 0.6 cm hypoechoic mass with distortion at the 9-9:30 position of the RIGHT breast 6 cm from the nipple with distortion in the UPPER-OUTER RIGHT breast, corresponding to the mammographic abnormality. One RIGHT axillary lymph node with borderline cortical thickness. Tissue sampling of the RIGHT breast mass and borderline RIGHT axillary lymph node recommended. ADDENDUM: Patient returned today for biopsy of a single borderline thickened lymph node in the RIGHT axilla. I could not reproduce a morphologically abnormal lymph node today, possibly resolved in the interval. Today, only normal appearing lymph nodes were identified in the RIGHT axilla. As such, the axillary ultrasound-guided biopsy was canceled.   11/10/2018 Cancer Staging   Staging form: Breast, AJCC 8th Edition - Clinical stage from 11/10/2018: Stage IA (cT1b, cN0, cM0, G2, ER+, PR+, HER2+) -  Signed by Truitt Merle, MD on 11/18/2018   11/11/2018 Initial Biopsy   Diagnosis 11/11/18 Breast, right, needle core biopsy, upper outer - 9:30 o'clock position - INVASIVE DUCTAL CARCINOMA, GRADE II/III. - SEE MICROSCOPIC DESCRIPTION.   11/11/2018 Receptors her2   Results: IMMUNOHISTOCHEMICAL AND MORPHOMETRIC ANALYSIS PERFORMED MANUALLY The tumor cells are POSITIVE for Her2 (3+). Estrogen Receptor: 95%, POSITIVE, STRONG STAINING INTENSITY Progesterone Receptor: 5%, POSITIVE, STRONG STAINING INTENSITY Proliferation Marker Ki67: 20%   11/17/2018 Initial Diagnosis   Malignant neoplasm of upper-outer quadrant of right breast in female, estrogen receptor positive ([place])   11/28/2018 Surgery   RIGHT BREAST LUMPECTOMY WITH RADIOACTIVE SEED AND RIGHT AXILLARY SENTINEL LYMPH NODE BIOPSY by Dr. Excell Seltzer  11/28/18    11/28/2018 Pathology Results   Diagnosis 11/28/18  1. Breast, lumpectomy, Right w/seed - INVASIVE DUCTAL CARCINOMA, 1.6 CM, NOTTINGHAM GRADE 2 OF 3. - MARGINS OF RESECTION ARE NOT INVOLVED (CLOSEST MARGIN: LESS THAN 1 MM, ANTERIOR). - DUCTAL CARCINOMA IN SITU. - BIOPSY SITE CHANGES. - SEE ONCOLOGY TABLE. 2. Lymph node, sentinel, biopsy, right Axillary - ONE LYMPH NODE, NEGATIVE FOR CARCINOMA (0/1). 3. Lymph node, sentinel, biopsy, right Axillary - ONE LYMPH NODE, NEGATIVE FOR CARCINOMA (0/1).   11/28/2018 Cancer Staging   Staging form: Breast, AJCC 8th Edition - Pathologic stage from 11/28/2018: Stage IA (pT1c, pN0, cM0, G2, ER+, PR+, HER2+) - Signed by Truitt Merle, MD on 12/11/2018   12/22/2018 Procedure   Baseline ECHO 12/22/18  IMPRESSIONS  1. The left ventricle has normal systolic function with an ejection fraction of 60-65%. The cavity size was normal. There is mild concentric left ventricular hypertrophy. Left ventricular diastolic parameters were normal.  2. The right ventricle has normal systolic function. The  cavity was normal. There is no increase in right ventricular wall thickness.   3. Left atrial size was moderately dilated.  4. The aortic valve is tricuspid. Aortic valve regurgitation was not assessed by color flow Doppler.    01/02/2019 - 02/13/2019 Chemotherapy   adjuvant chemo weekly Taxol with Herceptin q3weeks starting 01/02/19. Will d/c after 02/13/19 due to worsening neuropathy.       02/20/2019 -  Chemotherapy   Kadcyla q3weeks starting 02/20/19      CURRENT THERAPY:  -Adjuvant chemo weekly Taxol with Herceptin starting 01/02/19. Will d/c after 02/13/19 and will switch to Kadcyla q3weeks starting 02/20/19 -IV iron as needed  INTERVAL HISTORY:  Summer [name] is here for a follow up and start of Kadcyla. She presents to the clinic alone. She continues to have neuropathy especially in her right toes and all fingertips. She notes her last infusion with taxol caused a reaction. She has recovered. She notes her stinging at [facility] has resolved.     REVIEW OF SYSTEMS:   Constitutional: Denies fevers, chills or abnormal weight loss Eyes: Denies blurriness of vision Ears, nose, mouth, throat, and face: Denies mucositis or sore throat Respiratory: Denies cough, dyspnea or wheezes Cardiovascular: Denies palpitation, chest discomfort or lower extremity swelling Gastrointestinal:  Denies nausea, heartburn or change in bowel habits Skin: Denies abnormal skin rashes Lymphatics: Denies new lymphadenopathy or easy bruising Neurological: (+) Neuropathy, especially in fingertips and right toes Behavioral/Psych: Mood is stable, no new changes  All other systems were reviewed with the patient and are negative.  MEDICAL HISTORY:  Past Medical History:  Diagnosis Date  . Abdominal pain, left lower quadrant 11/09/2009   Qualifier: Diagnosis of  By: Valma Cava LPN, Izora Gala    . Allergic rhinitis 08/26/2008   Qualifier: Diagnosis of  By: Sherren Mocha MD, Jory Ee   . Anemia, iron deficiency 11/21/2018  . Arthritis shoulders and back  . BACK PAIN, CHRONIC 06/13/2007   Qualifier:  Diagnosis of  By: Sherren Mocha MD, Jory Ee   . 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 Constipation 07/01/2018  . Costochondritis 05/04/2014  . Diverticulitis of colon 06/13/2007   S/p segmental colectomy for perforated diverticulitis   . Diverticulosis   . Facial pain 10/06/2015  . Fibromyalgia   . Foot sprain, left, initial encounter 08/31/2016  . Glossitis 02/02/2016  . Headache 04/24/2016  . HEMATURIA UNSPECIFIED 06/25/2008   Qualifier: Diagnosis of  By: Sherren Mocha MD, Jory Ee   . HEMATURIA, HX OF 11/14/2009   Qualifier: Diagnosis of  By: Regino Schultze CMA (AAMA), Apolonio Schneiders    . History of kidney stones   . History of kidney stones   . History of shingles 2012--  no residual pain  . Itching 10/13/2018  . Left eye pain 07/01/2018  . Left ureteral calculus   . LLQ pain 04/24/2016  . LOC OSTEOARTHROS NOT SPEC PRIM/SEC OTH [facility] SITE 06/27/2009   Qualifier: Diagnosis of  By: Sherren Mocha MD, Jory Ee   . Malignant neoplasm of upper-outer quadrant of right breast in female, estrogen receptor positive ([place]) 11/17/2018  . MCI (mild cognitive impairment)   . Memory loss 12/21/2016  . Morbid obesity ([place]) 11/09/2010  . Pain of right side of body 08/31/2016  . Pruritus 10/26/2016  . Right sided temporal headache 12/21/2016  . Tremor 12/21/2016  . Trigeminal neuralgia RIGHT  . Trigeminal neuralgia of right side of face 11/30/2010  . Trochanteric bursitis, right hip 10/26/2016  .  Urgency of urination   . Weight loss 07/22/2017  . Zoster without complications 4/85/4627    SURGICAL HISTORY: Past Surgical History:  Procedure Laterality Date  . BREAST LUMPECTOMY WITH RADIOACTIVE SEED AND SENTINEL LYMPH NODE BIOPSY Right 11/28/2018   Procedure: RIGHT BREAST LUMPECTOMY WITH RADIOACTIVE SEED AND RIGHT AXILLARY SENTINEL LYMPH NODE BIOPSY;  Surgeon: Excell Seltzer, MD;  Location: [place];  Service: General;  Laterality: Right;  . CEREBRAL MICROVASCULAR DECOMPRESSION  07-05-2006    RIGHT TRIGEMINAL NERVE  . CYSTOSCOPY/RETROGRADE/URETEROSCOPY/STONE EXTRACTION WITH BASKET  07/04/2012   Procedure: CYSTOSCOPY/RETROGRADE/URETEROSCOPY/STONE EXTRACTION WITH BASKET;  Surgeon: Claybon Jabs, MD;  Location[facility];  Service: Urology;  Laterality: Left;  . EXPLORATORY LAPAROTOMY/ RESECTION MID TO DISTAL SIGMOID AND PROXIMAL RECTUM/ END PROXIMAL SIGMOID COLOSTOMY  07-17-2006   PERFORATED DIVERTICULITIS WITH PERITONITIS  . gamma knife  05/17/2016   for trigeminal neuralgia, [facility], Dr Salomon Fick  . gamma knife  2019  . PORTACATH PLACEMENT Left 11/28/2018   Procedure: INSERTION PORT-A-CATH WITH ULTRASOUND;  Surgeon: Excell Seltzer, MD;  Location: [place];  Service: General;  Laterality: Left;  . RESECTION COLOSTOMY/ CLOSURE COLOSTOMY WITH COLOPROCTOSTOMY  02-20-2007  . TONSILLECTOMY  AS CHILD  . URETEROSCOPY  07/04/2012   Procedure: URETEROSCOPY;  Surgeon: Claybon Jabs, MD;  Location: [facility];  Service: Urology;  Laterality: Left;  Marland Kitchen VAGINAL HYSTERECTOMY  1998   Partial  . WISDOM TOOTH EXTRACTION      I have reviewed the social history and family history with the patient and they are unchanged from previous note.  ALLERGIES:  is allergic to clindamycin hcl.  MEDICATIONS:  Current Outpatient Medications  Medication Sig Dispense Refill  . acetaminophen (TYLENOL) 500 MG tablet Take 1,000 mg by mouth every 6 (six) hours as needed (pain).     Marland Kitchen gabapentin (NEURONTIN) 600 MG tablet TAKE 2 TABLETS TID. MAX OF SIX TABS PER DAY. (Patient taking differently: Take 1,200 mg by mouth 3 (three) times daily. TAKE 2 TABLETS TID. MAX OF SIX TABS PER DAY.) 540 tablet 3  . lidocaine-prilocaine (EMLA) cream Apply to affected area once 30 g 3  . methocarbamol (ROBAXIN) 500 MG tablet Take 1 tablet (500 mg total) by mouth at bedtime as needed for muscle spasms. 30 tablet 1  . metoprolol tartrate (LOPRESSOR) 100 MG tablet Take 1 tablet, 100 mg, 2 hours before  your cardiac CT. 1 tablet 0  . omeprazole (PRILOSEC) 40 MG capsule Take 1 capsule (40 mg total) by mouth daily. 30 capsule 5  . oxyCODONE (OXY IR/ROXICODONE) 5 MG immediate release tablet Take 1 tablet (5 mg total) by mouth every 6 (six) hours as needed for moderate pain, severe pain or breakthrough pain. 10 tablet 0   No current facility-administered medications for this visit.    Facility-Administered Medications Ordered in Other Visits  Medication Dose Route Frequency Provider Last Rate Last Dose  . acetaminophen (TYLENOL) tablet 650 mg  650 mg Oral Once Truitt Merle, MD      . ado-trastuzumab emtansine [facility]) 300 mg in sodium chloride 0.9 % 250 mL chemo infusion  3.6 mg/kg (Treatment Plan Recorded) Intravenous Once Truitt Merle, MD      . diphenhydrAMINE (BENADRYL) capsule 50 mg  50 mg Oral Once Truitt Merle, MD      . heparin lock flush 100 unit/mL  500 Units Intracatheter Once PRN Truitt Merle, MD      . sodium chloride flush (NS) 0.9 % injection 10 mL  10  mL Intracatheter PRN Truitt Merle, MD        PHYSICAL EXAMINATION: ECOG PERFORMANCE STATUS: 1 - Symptomatic but completely ambulatory  Vitals:   02/20/19 0820  BP: (!) 137/59  Pulse: 70  Resp: 17  Temp: 98.2 F (36.8 C)  SpO2: 100%   Filed Weights   02/20/19 0820  Weight: 185 lb 14.4 oz (84.3 kg)    GENERAL:alert, no distress and comfortable SKIN: skin color, texture, turgor are normal, no rashes or significant lesions EYES: normal, Conjunctiva are pink and non-injected, sclera clear  NECK: supple, thyroid normal size, non-tender, without nodularity LYMPH:  no palpable lymphadenopathy in the cervical, axillary  LUNGS: clear to auscultation and percussion with normal breathing effort HEART: regular rate & rhythm and no murmurs and no lower extremity edema ABDOMEN:abdomen soft, non-tender and normal bowel sounds Musculoskeletal:no cyanosis of digits and no clubbing  NEURO: alert & oriented x 3 with fluent speech, no focal  motor/sensory deficits  LABORATORY DATA:  I have reviewed the data as listed CBC Latest Ref Rng & Units 02/20/2019 02/13/2019 02/03/2019  WBC 4.0 - 10.5 K/uL 7.8 5.7 3.7(L)  Hemoglobin 12.0 - 15.0 g/dL 12.1 12.0 12.1  Hematocrit 36.0 - 46.0 % 39.1 39.6 39.8  Platelets 150 - 400 K/uL 273 349 313     CMP Latest Ref Rng & Units 02/20/2019 02/13/2019 02/03/2019  Glucose 70 - 99 mg/dL 92 111(H) 103(H)  BUN 8 - 23 mg/dL 8 8 11   Creatinine 0.44 - 1.00 mg/dL 0.74 0.78 0.75  Sodium 135 - 145 mmol/L 141 143 141  Potassium 3.5 - 5.1 mmol/L 4.0 4.0 4.4  Chloride 98 - 111 mmol/L 104 107 105  CO2 22 - 32 mmol/L 28 28 30   Calcium 8.9 - 10.3 mg/dL 9.0 9.1 9.1  Total Protein 6.5 - 8.1 g/dL 6.3(L) 6.5 6.9  Total Bilirubin 0.3 - 1.2 mg/dL 0.3 0.4 0.3  Alkaline Phos 38 - 126 U/L 90 85 73  AST 15 - 41 U/L 19 20 23   ALT 0 - 44 U/L 22 18 20       RADIOGRAPHIC STUDIES: I have personally reviewed the radiological images as listed and agreed with the findings in the report. No results found.   ASSESSMENT & PLAN:  Summer [name] is a 68 y.o. female with   1.Malignant neoplasm of upper-outer quadrant of right breast, StageIA, pT1b,N0,M0, ER/PR+, HER2+, GradeII -She was recently diagnosed in 11/2018. She is s/p right breastlumpectomyand SLNB as of 11/28/18.  -Her pathology results showed her small grade II tumor was completely resected with negative margins and node negative. -She startedadjuvantweekly Taxol with Herceptinon4/24/20 but stopped after 6 weeks therapy due to worsening neuropathy and infusion reaction.  -Will proceed with TDM-1 Kadcyla q3weeks starting today. If she tolerates well will continue TDM-1 until 12/2019 but I will switch it to Herceptin if she has poor tolerance.  We again reviewed the potential side effects from Kadcyla, she voiced good understanding and agrees to proceed. -Labs reviewed, CBC WNL, CMP WNL. Overall adequate to start Kadcyla today. Will watch for  neuropathy.  -I discussion she can proceed with Radiation likely in July. Will refer him on next visit  -F/u in 3 weeks. She can contact clinic if she develops significant and unexpected side effects.    2. Anemia,iron deficiency -Pt notes she has been anemic in the past year -Last colonoscopy was in 04/2012 which wasunremarkable except diverticulosis.  -She had partial colectomy in the past due to severe diverticulosis. -This  is possiblyiron deficiency from diverticulosis.She denies any current bleeding.  -After COVID-19 improvesand her chemo treatmentI will refer herbacktoDr. Gessnerfor colonoscopy -Her workup showed iron 17, ferritin 4, normal retic ct, folate Vitamin B12 and SPEP panels. -IV iron was given 3/18/20and 12/12/18.  -will monitor her iron level every month -Hgnormalizedrecently   3.Trigeminal neuralgia  -on Gabapentin 277m TID  -She has been having moderate right side Has which I suspect is exacerbation of her neuralgia secondary to chemo. Will monitor.  -She has oxycodone which she can use for significant pain. -02/03/19 brain and neck MRI were stable.   4. Peripheral Neuropathy, G1 -Secondary to chemoTaxol started after cycle 3.  -I discussed neuropathy can progress with more treatment. Will monitor  -PA Vannpreviouslystarted her on Robaxin. She can take Tylenol in between for breakthrough pain.Continue -Her neuropathyhas worsened,mainly in her left toes. Mildly in her fingertips.  -Taxol switched to TDM-1 Kadcyla on 02/20/19  5. Low appetite and weight loss  -Secondary to chemo she has had taste change resulting in low appetite and weight loss -I encouraged her to increase her nutritional supplements to 3-4 a day  -Her appetite is fair but she continues to eat adequately and able to maintain weight.  -I previously set up dietician consult.     PLAN: -Labs reviewed and adeqaute to proceed with first cycle of Kadcyla today  -Lab,  flush, f/u and Kadcyle in 3 weeks    No problem-specific Assessment & Plan notes found for this encounter.   No orders of the defined types were placed in this encounter.  All questions were answered. The patient knows to call the clinic with any problems, questions or concerns. No barriers to learning was detected. I spent 20 minutes counseling the patient face to face. The total time spent in the appointment was 25 minutes and more than 50% was on counseling and review of test results     YTruitt Merle MD 02/20/2019   I, AJoslyn Devon am acting as scribe for YTruitt Merle MD.   I have reviewed the above documentation for accuracy and completeness, and I agree with the above.    Addendum  Her insurance has not approved Kadcyla, will postpone her treatment to next week.   YTruitt Merle

## 2019-02-20 ENCOUNTER — Inpatient Hospital Stay: Payer: Federal, State, Local not specified - PPO

## 2019-02-20 ENCOUNTER — Encounter: Payer: Self-pay | Admitting: Hematology

## 2019-02-20 ENCOUNTER — Inpatient Hospital Stay: Payer: Federal, State, Local not specified - PPO | Admitting: Hematology

## 2019-02-20 ENCOUNTER — Other Ambulatory Visit: Payer: Self-pay

## 2019-02-20 VITALS — BP 137/59 | HR 70 | Temp 98.2°F | Resp 17 | Ht 66.0 in | Wt 185.9 lb

## 2019-02-20 VITALS — BP 109/48 | HR 65 | Temp 98.1°F | Resp 16

## 2019-02-20 DIAGNOSIS — L539 Erythematous condition, unspecified: Secondary | ICD-10-CM

## 2019-02-20 DIAGNOSIS — G500 Trigeminal neuralgia: Secondary | ICD-10-CM

## 2019-02-20 DIAGNOSIS — C50411 Malignant neoplasm of upper-outer quadrant of right female breast: Secondary | ICD-10-CM

## 2019-02-20 DIAGNOSIS — G620 Drug-induced polyneuropathy: Secondary | ICD-10-CM

## 2019-02-20 DIAGNOSIS — M797 Fibromyalgia: Secondary | ICD-10-CM

## 2019-02-20 DIAGNOSIS — Z170 Estrogen receptor positive status [ER+]: Secondary | ICD-10-CM | POA: Diagnosis not present

## 2019-02-20 DIAGNOSIS — D509 Iron deficiency anemia, unspecified: Secondary | ICD-10-CM

## 2019-02-20 DIAGNOSIS — Z95828 Presence of other vascular implants and grafts: Secondary | ICD-10-CM

## 2019-02-20 DIAGNOSIS — Z79899 Other long term (current) drug therapy: Secondary | ICD-10-CM

## 2019-02-20 DIAGNOSIS — T451X5S Adverse effect of antineoplastic and immunosuppressive drugs, sequela: Secondary | ICD-10-CM

## 2019-02-20 DIAGNOSIS — R634 Abnormal weight loss: Secondary | ICD-10-CM

## 2019-02-20 LAB — CBC WITH DIFFERENTIAL (CANCER CENTER ONLY)
Abs Immature Granulocytes: 0.3 10*3/uL — ABNORMAL HIGH (ref 0.00–0.07)
Basophils Absolute: 0.1 10*3/uL (ref 0.0–0.1)
Basophils Relative: 1 %
Eosinophils Absolute: 0.3 10*3/uL (ref 0.0–0.5)
Eosinophils Relative: 4 %
HCT: 39.1 % (ref 36.0–46.0)
Hemoglobin: 12.1 g/dL (ref 12.0–15.0)
Immature Granulocytes: 4 %
Lymphocytes Relative: 28 %
Lymphs Abs: 2.2 10*3/uL (ref 0.7–4.0)
MCH: 28.3 pg (ref 26.0–34.0)
MCHC: 30.9 g/dL (ref 30.0–36.0)
MCV: 91.6 fL (ref 80.0–100.0)
Monocytes Absolute: 0.8 10*3/uL (ref 0.1–1.0)
Monocytes Relative: 10 %
Neutro Abs: 4.2 10*3/uL (ref 1.7–7.7)
Neutrophils Relative %: 53 %
Platelet Count: 273 10*3/uL (ref 150–400)
RBC: 4.27 MIL/uL (ref 3.87–5.11)
RDW: 17.5 % — ABNORMAL HIGH (ref 11.5–15.5)
WBC Count: 7.8 10*3/uL (ref 4.0–10.5)
nRBC: 0 % (ref 0.0–0.2)

## 2019-02-20 LAB — CMP (CANCER CENTER ONLY)
ALT: 22 U/L (ref 0–44)
AST: 19 U/L (ref 15–41)
Albumin: 3.4 g/dL — ABNORMAL LOW (ref 3.5–5.0)
Alkaline Phosphatase: 90 U/L (ref 38–126)
Anion gap: 9 (ref 5–15)
BUN: 8 mg/dL (ref 8–23)
CO2: 28 mmol/L (ref 22–32)
Calcium: 9 mg/dL (ref 8.9–10.3)
Chloride: 104 mmol/L (ref 98–111)
Creatinine: 0.74 mg/dL (ref 0.44–1.00)
GFR, Est AFR Am: >60 mL/min (ref >60)
GFR, Estimated: >60 mL/min (ref >60)
Glucose, Bld: 92 mg/dL (ref 70–99)
Potassium: 4 mmol/L (ref 3.5–5.1)
Sodium: 141 mmol/L (ref 135–145)
Total Bilirubin: 0.3 mg/dL (ref 0.3–1.2)
Total Protein: 6.3 g/dL — ABNORMAL LOW (ref 6.5–8.1)

## 2019-02-20 MED ORDER — DIPHENHYDRAMINE HCL 25 MG PO CAPS
ORAL_CAPSULE | ORAL | Status: AC
  Filled 2019-02-20: qty 2

## 2019-02-20 MED ORDER — SODIUM CHLORIDE 0.9 % IV SOLN
Freq: Once | INTRAVENOUS | Status: AC
  Administered 2019-02-20: 10:00:00 via INTRAVENOUS
  Filled 2019-02-20: qty 250

## 2019-02-20 MED ORDER — DIPHENHYDRAMINE HCL 25 MG PO CAPS
50.0000 mg | ORAL_CAPSULE | Freq: Once | ORAL | Status: AC
  Administered 2019-02-20: 50 mg via ORAL

## 2019-02-20 MED ORDER — HEPARIN SOD (PORK) LOCK FLUSH 100 UNIT/ML IV SOLN
500.0000 [IU] | Freq: Once | INTRAVENOUS | Status: AC | PRN
  Administered 2019-02-20: 500 [IU]
  Filled 2019-02-20: qty 5

## 2019-02-20 MED ORDER — LIDOCAINE-PRILOCAINE 2.5-2.5 % EX CREA: TOPICAL_CREAM | CUTANEOUS | 3 refills | Status: DC

## 2019-02-20 MED ORDER — SODIUM CHLORIDE 0.9 % IV SOLN
3.6000 mg/kg | Freq: Once | INTRAVENOUS | Status: AC
  Administered 2019-02-20: 300 mg via INTRAVENOUS
  Filled 2019-02-20: qty 15

## 2019-02-20 MED ORDER — ACETAMINOPHEN 325 MG PO TABS
ORAL_TABLET | ORAL | Status: AC
  Filled 2019-02-20: qty 2

## 2019-02-20 MED ORDER — SODIUM CHLORIDE 0.9% FLUSH
10.0000 mL | INTRAVENOUS | Status: DC | PRN
  Administered 2019-02-20: 10 mL
  Filled 2019-02-20: qty 10

## 2019-02-20 MED ORDER — ACETAMINOPHEN 325 MG PO TABS
650.0000 mg | ORAL_TABLET | Freq: Once | ORAL | Status: AC
  Administered 2019-02-20: 650 mg via ORAL

## 2019-02-20 MED ORDER — SODIUM CHLORIDE 0.9% FLUSH
10.0000 mL | Freq: Once | INTRAVENOUS | Status: AC
  Administered 2019-02-20: 10 mL
  Filled 2019-02-20: qty 10

## 2019-02-20 NOTE — Patient Instructions (Signed)
[facility] Discharge Instructions for Patients Receiving Chemotherapy  Today you received the following chemotherapy agents Kadcyla  To help prevent nausea and vomiting after your treatment, we encourage you to take your nausea medication as prescribed If you develop nausea and vomiting that is not controlled by your nausea medication, call the clinic.   BELOW ARE SYMPTOMS THAT SHOULD BE REPORTED IMMEDIATELY:  *FEVER GREATER THAN 100.5 F  *CHILLS WITH OR WITHOUT FEVER  NAUSEA AND VOMITING THAT IS NOT CONTROLLED WITH YOUR NAUSEA MEDICATION  *UNUSUAL SHORTNESS OF BREATH  *UNUSUAL BRUISING OR BLEEDING  TENDERNESS IN MOUTH AND THROAT WITH OR WITHOUT PRESENCE OF ULCERS  *URINARY PROBLEMS  *BOWEL PROBLEMS  UNUSUAL RASH Items with * indicate a potential emergency and should be followed up as soon as possible.  Feel free to call the clinic should you have any questions or concerns. The clinic phone number is [phone].  Please show the CHEMO ALERT CARD at check-in to the Emergency Department and triage nurse.   Ado-Trastuzumab Emtansine for injection What is this medicine? ADO-TRASTUZUMAB EMTANSINE (ADD oh traz TOO zuh mab em TAN zine) is a monoclonal antibody combined with chemotherapy. It is used to treat breast cancer. This medicine may be used for other purposes; ask your health care provider or pharmacist if you have questions. COMMON BRAND NAME(S): Kadcyla What should I tell my health care provider before I take this medicine? They need to know if you have any of these conditions: -heart disease -heart failure -infection (especially a virus infection such as chickenpox, cold sores, or herpes) -liver disease -lung or breathing disease, like asthma -an unusual or allergic reaction to ado-trastuzumab emtansine, other medications, foods, dyes, or preservatives -pregnant or trying to get pregnant -breast-feeding How should I use this medicine? This  medicine is for infusion into a vein. It is given by a health care professional in a hospital or clinic setting. Talk to your pediatrician regarding the use of this medicine in children. Special care may be needed. Overdosage: If you think you have taken too much of this medicine contact a poison control center or emergency room at once. NOTE: This medicine is only for you. Do not share this medicine with others. What if I miss a dose? It is important not to miss your dose. Call your doctor or health care professional if you are unable to keep an appointment. What may interact with this medicine? This medicine may also interact with the following medications: -atazanavir -boceprevir -clarithromycin -delavirdine -indinavir -dalfopristin; quinupristin -isoniazid, INH -itraconazole -ketoconazole -nefazodone -nelfinavir -ritonavir -telaprevir -telithromycin -tipranavir -voriconazole This list may not describe all possible interactions. Give your health care provider a list of all the medicines, herbs, non-prescription drugs, or dietary supplements you use. Also tell them if you smoke, drink alcohol, or use illegal drugs. Some items may interact with your medicine. What should I watch for while using this medicine? Visit your doctor for checks on your progress. This drug may make you feel generally unwell. This is not uncommon, as chemotherapy can affect healthy cells as well as cancer cells. Report any side effects. Continue your course of treatment even though you feel ill unless your doctor tells you to stop. You may need blood work done while you are taking this medicine. Call your doctor or health care professional for advice if you get a fever, chills or sore throat, or other symptoms of a cold or flu. Do not treat yourself. This drug decreases your body's ability   to fight infections. Try to avoid being around people who are sick. Be careful brushing and flossing your teeth or using a  toothpick because you may get an infection or bleed more easily. If you have any dental work done, tell your dentist you are receiving this medicine. Avoid taking products that contain aspirin, acetaminophen, ibuprofen, naproxen, or ketoprofen unless instructed by your doctor. These medicines may hide a fever. Do not become pregnant while taking this medicine or for 7 months after stopping it, men with female partners should use contraception during treatment and for 4 months after the last dose. Women should inform their doctor if they wish to become pregnant or think they might be pregnant. There is a potential for serious side effects to an unborn child. Do not breast-feed an infant while taking this medicine or for 7 months after the last dose. Men who have a partner who is pregnant or who is capable of becoming pregnant should use a condom during sexual activity while taking this medicine and for 4 months after stopping it. Men should inform their doctors if they wish to father a child. This medicine may lower sperm counts. Talk to your health care professional or pharmacist for more information. What side effects may I notice from receiving this medicine? Side effects that you should report to your doctor or health care professional as soon as possible: -allergic reactions like skin rash, itching or hives, swelling of the face, lips, or tongue -breathing problems -chest pain or palpitations -fever or chills, sore throat -general ill feeling or flu-like symptoms -light-colored stools -nausea, vomiting -pain, tingling, numbness in the hands or feet -signs and symptoms of bleeding such as bloody or black, tarry stools; red or dark-brown urine; spitting up blood or brown material that looks like coffee grounds; red spots on the skin; unusual bruising or bleeding from the eye, gums, or nose -swelling of the legs or ankles -yellowing of the eyes or skin Side effects that usually do not require  medical attention (report to your doctor or health care professional if they continue or are bothersome): -changes in taste -constipation -dizziness -headache -joint pain -muscle pain -trouble sleeping -unusually weak or tired This list may not describe all possible side effects. Call your doctor for medical advice about side effects. You may report side effects to FDA at 1-800-FDA-1088. Where should I keep my medicine? This drug is given in a hospital or clinic and will not be stored at home. NOTE: This sheet is a summary. It may not cover all possible information. If you have questions about this medicine, talk to your doctor, pharmacist, or health care provider.  2019 Elsevier/Gold Standard (2015-10-17 12:11:06)    

## 2019-02-20 NOTE — Progress Notes (Signed)
Patient called her insurance company to f/u authorization for [place]. She was told her drug didn't require review. Ok to proceed with treatment today per management. Message sent to PA team.  Hardie Pulley, PharmD, BCPS, BCOP

## 2019-02-23 ENCOUNTER — Telehealth: Payer: Self-pay | Admitting: Hematology

## 2019-02-23 NOTE — Telephone Encounter (Signed)
No los per 6/12.

## 2019-02-24 ENCOUNTER — Encounter: Payer: Self-pay | Admitting: Hematology

## 2019-02-27 ENCOUNTER — Ambulatory Visit: Payer: Federal, State, Local not specified - PPO

## 2019-02-27 ENCOUNTER — Other Ambulatory Visit: Payer: Federal, State, Local not specified - PPO

## 2019-02-27 NOTE — Telephone Encounter (Signed)
Message left with information of refusal.

## 2019-03-05 ENCOUNTER — Telehealth (HOSPITAL_COMMUNITY): Payer: Self-pay | Admitting: Emergency Medicine

## 2019-03-05 NOTE — Telephone Encounter (Signed)
Left message on voicemail with name and callback number Dejai Schubach RN Navigator Cardiac Imaging [facility] [phone] Office [phone] Cell  

## 2019-03-06 ENCOUNTER — Other Ambulatory Visit: Payer: Federal, State, Local not specified - PPO

## 2019-03-06 ENCOUNTER — Ambulatory Visit: Payer: Federal, State, Local not specified - PPO | Admitting: Hematology

## 2019-03-06 ENCOUNTER — Ambulatory Visit: Payer: Federal, State, Local not specified - PPO

## 2019-03-09 ENCOUNTER — Encounter (HOSPITAL_COMMUNITY): Payer: Self-pay

## 2019-03-09 ENCOUNTER — Ambulatory Visit (HOSPITAL_COMMUNITY): Admission: RE | Admit: 2019-03-09 | Payer: Federal, State, Local not specified - PPO | Source: Ambulatory Visit

## 2019-03-09 ENCOUNTER — Other Ambulatory Visit: Payer: Self-pay

## 2019-03-09 ENCOUNTER — Ambulatory Visit (HOSPITAL_COMMUNITY)
Admission: RE | Admit: 2019-03-09 | Discharge: 2019-03-09 | Disposition: A | Discharge status: Home / Self Care | Payer: Federal, State, Local not specified - PPO | Source: Ambulatory Visit | Attending: Cardiology | Admitting: Cardiology

## 2019-03-09 DIAGNOSIS — R079 Chest pain, unspecified: Secondary | ICD-10-CM

## 2019-03-09 DIAGNOSIS — R0789 Other chest pain: Secondary | ICD-10-CM | POA: Diagnosis present

## 2019-03-09 MED ORDER — IOHEXOL 350 MG/ML SOLN
80.0000 mL | Freq: Once | INTRAVENOUS | Status: AC | PRN
  Administered 2019-03-09: 100 mL via INTRAVENOUS

## 2019-03-09 MED ORDER — NITROGLYCERIN 0.4 MG SL SUBL
0.8000 mg | SUBLINGUAL_TABLET | Freq: Once | SUBLINGUAL | Status: AC
  Administered 2019-03-09: 0.8 mg via SUBLINGUAL

## 2019-03-09 MED ORDER — NITROGLYCERIN 0.4 MG SL SUBL
SUBLINGUAL_TABLET | SUBLINGUAL | Status: AC
  Filled 2019-03-09: qty 2

## 2019-03-09 MED ORDER — HEPARIN SOD (PORK) LOCK FLUSH 100 UNIT/ML IV SOLN
500.0000 [IU] | INTRAVENOUS | Status: AC | PRN
  Administered 2019-03-09: 500 [IU]

## 2019-03-10 NOTE — Progress Notes (Signed)
[place]   [phone] [phone]   Clinic Follow up Note   Patient Care Team: Hoyt Koch, MD as PCP - General (Internal Medicine) Mauro Kaufmann, RN as Oncology Nurse Navigator Rockwell Germany, RN as Oncology Nurse Navigator Excell Seltzer, MD as Consulting Physician (General Surgery) Truitt Merle, MD as Consulting Physician (Hematology) Gery Pray, MD as Consulting Physician (Radiation Oncology)  Date of Service:  03/12/2019  CHIEF COMPLAINT: F/u of right breast cancer   SUMMARY OF ONCOLOGIC HISTORY: Oncology History Overview Note  Cancer Staging Malignant neoplasm of upper-outer quadrant of right breast in female, estrogen receptor positive ([place]) Staging form: Breast, AJCC 8th Edition - Clinical stage from 11/10/2018: Stage IA (cT1b, cN0, cM0, G2, ER+, PR+, HER2+) - Signed by Truitt Merle, MD on 11/18/2018     Malignant neoplasm of upper-outer quadrant of right breast in female, estrogen receptor positive ([place])  11/07/2018 Mammogram   Diagnostic 11/07/18 IMPRESSION: 1. 1 x 0.7 x 0.6 cm hypoechoic mass with distortion at the 9-9:30 position of the RIGHT breast 6 cm from the nipple with distortion in the UPPER-OUTER RIGHT breast, corresponding to the mammographic abnormality. One RIGHT axillary lymph node with borderline cortical thickness. Tissue sampling of the RIGHT breast mass and borderline RIGHT axillary lymph node recommended. ADDENDUM: Patient returned today for biopsy of a single borderline thickened lymph node in the RIGHT axilla. I could not reproduce a morphologically abnormal lymph node today, possibly resolved in the interval. Today, only normal appearing lymph nodes were identified in the RIGHT axilla. As such, the axillary ultrasound-guided biopsy was canceled.   11/10/2018 Cancer Staging   Staging form: Breast, AJCC 8th Edition - Clinical stage from 11/10/2018: Stage IA (cT1b, cN0, cM0, G2, ER+, PR+, HER2+) -  Signed by Truitt Merle, MD on 11/18/2018   11/11/2018 Initial Biopsy   Diagnosis 11/11/18 Breast, right, needle core biopsy, upper outer - 9:30 o'clock position - INVASIVE DUCTAL CARCINOMA, GRADE II/III. - SEE MICROSCOPIC DESCRIPTION.   11/11/2018 Receptors her2   Results: IMMUNOHISTOCHEMICAL AND MORPHOMETRIC ANALYSIS PERFORMED MANUALLY The tumor cells are POSITIVE for Her2 (3+). Estrogen Receptor: 95%, POSITIVE, STRONG STAINING INTENSITY Progesterone Receptor: 5%, POSITIVE, STRONG STAINING INTENSITY Proliferation Marker Ki67: 20%   11/17/2018 Initial Diagnosis   Malignant neoplasm of upper-outer quadrant of right breast in female, estrogen receptor positive ([place])   11/28/2018 Surgery   RIGHT BREAST LUMPECTOMY WITH RADIOACTIVE SEED AND RIGHT AXILLARY SENTINEL LYMPH NODE BIOPSY by Dr. Excell Seltzer  11/28/18    11/28/2018 Pathology Results   Diagnosis 11/28/18  1. Breast, lumpectomy, Right w/seed - INVASIVE DUCTAL CARCINOMA, 1.6 CM, NOTTINGHAM GRADE 2 OF 3. - MARGINS OF RESECTION ARE NOT INVOLVED (CLOSEST MARGIN: LESS THAN 1 MM, ANTERIOR). - DUCTAL CARCINOMA IN SITU. - BIOPSY SITE CHANGES. - SEE ONCOLOGY TABLE. 2. Lymph node, sentinel, biopsy, right Axillary - ONE LYMPH NODE, NEGATIVE FOR CARCINOMA (0/1). 3. Lymph node, sentinel, biopsy, right Axillary - ONE LYMPH NODE, NEGATIVE FOR CARCINOMA (0/1).   11/28/2018 Cancer Staging   Staging form: Breast, AJCC 8th Edition - Pathologic stage from 11/28/2018: Stage IA (pT1c, pN0, cM0, G2, ER+, PR+, HER2+) - Signed by Truitt Merle, MD on 12/11/2018   12/22/2018 Procedure   Baseline ECHO 12/22/18  IMPRESSIONS  1. The left ventricle has normal systolic function with an ejection fraction of 60-65%. The cavity size was normal. There is mild concentric left ventricular hypertrophy. Left ventricular diastolic parameters were normal.  2. The right ventricle has normal systolic function. The  cavity was normal. There is no increase in right ventricular wall thickness.   3. Left atrial size was moderately dilated.  4. The aortic valve is tricuspid. Aortic valve regurgitation was not assessed by color flow Doppler.    01/02/2019 - 02/13/2019 Chemotherapy   adjuvant chemo weekly Taxol with Herceptin q3weeks starting 01/02/19. Will d/c after 02/13/19 due to worsening neuropathy.       02/20/2019 -  Chemotherapy   Kadcyla q3weeks starting 02/20/19      CURRENT THERAPY:  -Adjuvant chemo weekly Taxol with Herceptin starting 01/02/19. Will d/c after 02/13/19 and will switch to Kadcyla q3weeks starting 02/20/19 -IV iron as needed  INTERVAL HISTORY:  Summer [name] is here for a follow up and treatment. She presents to the clinic alone. She notes she is doing well overall. She feels better overall. She did have complete hair loss. She notes her hands color is returning to normal. She does still have neuropathy of her fingers and toes which is most at night. This is overall stable.  She feels she tolerated Kadcyle well with no nausea, diarrhea, improved appetite and energy.    REVIEW OF SYSTEMS:   Constitutional: Denies fevers, chills or abnormal weight loss Eyes: Denies blurriness of vision Ears, nose, mouth, throat, and face: Denies mucositis or sore throat Respiratory: Denies cough, dyspnea or wheezes Cardiovascular: Denies palpitation, chest discomfort or lower extremity swelling Gastrointestinal:  Denies nausea, heartburn or change in bowel habits Skin: Denies abnormal skin rashes (+) complete hair loss (+) skin color returning to normal  Lymphatics: Denies new lymphadenopathy or easy bruising Neurological: (+) neuropathy is stable in fingers and toes  Behavioral/Psych: Mood is stable, no new changes  All other systems were reviewed with the patient and are negative.  MEDICAL HISTORY:  Past Medical History:  Diagnosis Date  . Abdominal pain, left lower quadrant 11/09/2009   Qualifier: Diagnosis of  By: Valma Cava LPN, Izora Gala    . Allergic rhinitis  08/26/2008   Qualifier: Diagnosis of  By: Sherren Mocha MD, Jory Ee   . Anemia, iron deficiency 11/21/2018  . Arthritis shoulders and back  . BACK PAIN, CHRONIC 06/13/2007   Qualifier: Diagnosis of  By: Sherren Mocha MD, Jory Ee   . 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 Constipation 07/01/2018  . Costochondritis 05/04/2014  . Diverticulitis of colon 06/13/2007   S/p segmental colectomy for perforated diverticulitis   . Diverticulosis   . Facial pain 10/06/2015  . Fibromyalgia   . Foot sprain, left, initial encounter 08/31/2016  . Glossitis 02/02/2016  . Headache 04/24/2016  . HEMATURIA UNSPECIFIED 06/25/2008   Qualifier: Diagnosis of  By: Sherren Mocha MD, Jory Ee   . HEMATURIA, HX OF 11/14/2009   Qualifier: Diagnosis of  By: Regino Schultze CMA (AAMA), Apolonio Schneiders    . History of kidney stones   . History of kidney stones   . History of shingles 2012--  no residual pain  . Itching 10/13/2018  . Left eye pain 07/01/2018  . Left ureteral calculus   . LLQ pain 04/24/2016  . LOC OSTEOARTHROS NOT SPEC PRIM/SEC OTH [facility] SITE 06/27/2009   Qualifier: Diagnosis of  By: Sherren Mocha MD, Jory Ee   . Malignant neoplasm of upper-outer quadrant of right breast in female, estrogen receptor positive ([place]) 11/17/2018  . MCI (mild cognitive impairment)   . Memory loss 12/21/2016  . Morbid obesity ([place]) 11/09/2010  . Pain of right side of body 08/31/2016  . Pruritus  10/26/2016  . Right sided temporal headache 12/21/2016  . Tremor 12/21/2016  . Trigeminal neuralgia RIGHT  . Trigeminal neuralgia of right side of face 11/30/2010  . Trochanteric bursitis, right hip 10/26/2016  . Urgency of urination   . Weight loss 07/22/2017  . Zoster without complications 2/54/2706    SURGICAL HISTORY: Past Surgical History:  Procedure Laterality Date  . BREAST LUMPECTOMY WITH RADIOACTIVE SEED AND SENTINEL LYMPH NODE BIOPSY Right 11/28/2018   Procedure: RIGHT BREAST LUMPECTOMY WITH RADIOACTIVE SEED AND RIGHT  AXILLARY SENTINEL LYMPH NODE BIOPSY;  Surgeon: Excell Seltzer, MD;  Location: [facility];  Service: General;  Laterality: Right;  . CEREBRAL MICROVASCULAR DECOMPRESSION  07-05-2006   RIGHT TRIGEMINAL NERVE  . CYSTOSCOPY/RETROGRADE/URETEROSCOPY/STONE EXTRACTION WITH BASKET  07/04/2012   Procedure: CYSTOSCOPY/RETROGRADE/URETEROSCOPY/STONE EXTRACTION WITH BASKET;  Surgeon: Claybon Jabs, MD;  Location[facility];  Service: Urology;  Laterality: Left;  . EXPLORATORY LAPAROTOMY/ RESECTION MID TO DISTAL SIGMOID AND PROXIMAL RECTUM/ END PROXIMAL SIGMOID COLOSTOMY  07-17-2006   PERFORATED DIVERTICULITIS WITH PERITONITIS  . gamma knife  05/17/2016   for trigeminal neuralgia, [facility], Dr Salomon Fick  . gamma knife  2019  . PORTACATH PLACEMENT Left 11/28/2018   Procedure: INSERTION PORT-A-CATH WITH ULTRASOUND;  Surgeon: Excell Seltzer, MD;  Location: [facility];  Service: General;  Laterality: Left;  . RESECTION COLOSTOMY/ CLOSURE COLOSTOMY WITH COLOPROCTOSTOMY  02-20-2007  . TONSILLECTOMY  AS CHILD  . URETEROSCOPY  07/04/2012   Procedure: URETEROSCOPY;  Surgeon: Claybon Jabs, MD;  Location: [facility];  Service: Urology;  Laterality: Left;  Marland Kitchen VAGINAL HYSTERECTOMY  1998   Partial  . WISDOM TOOTH EXTRACTION      I have reviewed the social history and family history with the patient and they are unchanged from previous note.  ALLERGIES:  is allergic to clindamycin hcl.  MEDICATIONS:  Current Outpatient Medications  Medication Sig Dispense Refill  . acetaminophen (TYLENOL) 500 MG tablet Take 1,000 mg by mouth every 6 (six) hours as needed (pain).     Marland Kitchen gabapentin (NEURONTIN) 600 MG tablet TAKE 2 TABLETS TID. MAX OF SIX TABS PER DAY. (Patient taking differently: Take 1,200 mg by mouth 3 (three) times daily. TAKE 2 TABLETS TID. MAX OF SIX TABS PER DAY.) 540 tablet 3  . lidocaine-prilocaine (EMLA) cream Apply to affected area once 30 g 3  . methocarbamol (ROBAXIN) 500 MG  tablet Take 1 tablet (500 mg total) by mouth at bedtime as needed for muscle spasms. 30 tablet 1  . metoprolol tartrate (LOPRESSOR) 100 MG tablet Take 1 tablet, 100 mg, 2 hours before your cardiac CT. 1 tablet 0  . omeprazole (PRILOSEC) 40 MG capsule Take 1 capsule (40 mg total) by mouth daily. 30 capsule 5  . oxyCODONE (OXY IR/ROXICODONE) 5 MG immediate release tablet Take 1 tablet (5 mg total) by mouth every 6 (six) hours as needed for moderate pain, severe pain or breakthrough pain. 10 tablet 0   No current facility-administered medications for this visit.     PHYSICAL EXAMINATION: ECOG PERFORMANCE STATUS: 1 - Symptomatic but completely ambulatory  Vitals:   03/12/19 1031  BP: 136/64  Pulse: 77  Resp: 18  Temp: 98.6 F (37 C)  SpO2: 100%   Filed Weights   03/12/19 1031  Weight: 185 lb 1.6 oz (84 kg)    GENERAL:alert, no distress and comfortable SKIN: skin color, texture, turgor are normal, no rashes or significant lesions EYES: normal, Conjunctiva are pink and non-injected, sclera clear  NECK: supple, thyroid normal size, non-tender, without nodularity LYMPH:  no palpable lymphadenopathy in the cervical, axillary  LUNGS: clear to auscultation and percussion with normal breathing effort HEART: regular rate & rhythm and no murmurs and no lower extremity edema ABDOMEN:abdomen soft, non-tender and normal bowel sounds Breast exam deferred today   LABORATORY DATA:  I have reviewed the data as listed CBC Latest Ref Rng & Units 03/12/2019 02/20/2019 02/13/2019  WBC 4.0 - 10.5 K/uL 6.0 7.8 5.7  Hemoglobin 12.0 - 15.0 g/dL 12.5 12.1 12.0  Hematocrit 36.0 - 46.0 % 39.9 39.1 39.6  Platelets 150 - 400 K/uL 235 273 349     CMP Latest Ref Rng & Units 02/20/2019 02/13/2019 02/03/2019  Glucose 70 - 99 mg/dL 92 111(H) 103(H)  BUN 8 - 23 mg/dL _0 Creatinine 0.44 - 1.00 mg/dL 0.74 0.78 0.75  Sodium 135 - 145 mmol/L 141 143 141  Potassium 3.5 - 5.1 mmol/L 4.0 4.0 4.4  Chloride 98 - 111  mmol/L 104 107 105  CO2 22 - 32 mmol/L _1 Calcium 8.9 - 10.3 mg/dL 9.0 9.1 9.1  Total Protein 6.5 - 8.1 g/dL 6.3(L) 6.5 6.9  Total Bilirubin 0.3 - 1.2 mg/dL 0.3 0.4 0.3  Alkaline Phos 38 - 126 U/L 90 85 73  AST 15 - 41 U/L _2 ALT 0 - 44 U/L _3 RADIOGRAPHIC STUDIES: I have personally reviewed the radiological images as listed and agreed with the findings in the report. No results found.   ASSESSMENT & PLAN:  Summer [name] is a 68 y.o. female with   1.Malignant neoplasm of upper-outer quadrant of right breast, StageIA,pT1b,N0,M0, ER/PR+, HER2+, GradeII -She was recently diagnosed in 11/2018. She is s/p right breastlumpectomyand SLNB as of 11/28/18.  -Herpathology results showed her small grade II tumor was completely resected with negative margins and node negative. -She startedadjuvantweekly Taxol with Herceptinon4/24/20 but stopped after 6 weeks therapy due to worsening neuropathy and infusion reaction.  -Adjuvant chemo changed to TDM-1 (Kadcyla) q3weeks starting 02/20/19. If she tolerates we will continue anti-her2 treatment until 12/2019. I will likely switch to maintenance Herceptin later in her treatment or sooner if she has poor tolerance of Kadcyla.  -She has tolerated first cycle Kadcyla well. Neuropathy is stable.  -Labs reviewed, CBC and CMP WNL. Ferritin is still pending. Overall adequate to proceed with Kadcyla today.   -Next ECHO in 03/2019 with Dr Radford Pax.  -I discussed she can proceed with Radiation likely in July before starting anti-estrogen therapy.  -F/u in 3 weeks. She can contact clinic if she develops significant and unexpected side effects.    2. Anemia,iron deficiency -Pt notes she has been anemic in the past year -Last colonoscopy was in 04/2012 which wasunremarkable except diverticulosis.  -She had partial colectomy in the past due to severe diverticulosis. -This is possiblyiron deficiency from  diverticulosis.She denies any current bleeding.  -After COVID-19 improvesand her chemo treatmentI will refer herbacktoDr. Gessnerfor colonoscopy -Her workup showed iron 17, ferritin 4, normal retic ct, folate Vitamin B12 and SPEP panels. -IV iron was given 3/18/20and 12/12/18.  -will monitor her iron level every month -Hgnormalizedrecently, Ferritin still pending today   3.Trigeminal neuralgia  -onGabapentin 28m TID -She has been having moderate right side Has which I suspect is exacerbation of her neuralgia secondary to chemo. Will monitor.  -She has oxycodone which she can use for significant pain. -5/26/20brain and neck MRIwere stable.  4.PeripheralNeuropathy, G1 -Secondary to chemoTaxolstarted aftercycle 3. -I discussed neuropathy can progress with more treatment. Will monitor  -PA Vannpreviouslystarted her on Robaxin. She can take Tylenol in between for breakthrough pain.Continue -Her neuropathyhas worsened,mainly in her left toes. Mildly in her fingertips. -Taxol switched to TDM-1 Kadcyla on 02/20/19. Stable still   5. Low appetite and weight loss  -Secondary to chemo she has had taste change resulting in low appetite and weight loss -I encouraged her to increase her nutritional supplements to 3-4 a day -I previously set up dietician consult. -Her appetite has improved off Taxol and she is eating adequately and able to maintain weight.    PLAN: -Send Rad Onc Referral to Dr Sondra Come  -Send message to Dr Radford Pax about next ECHO in 03/2019.  -Labs reviewed and adeqaute to proceed with Kadcyla today  -Lab, flush, f/u and Kadcyle in 3 weeks    No problem-specific Assessment & Plan notes found for this encounter.   No orders of the defined types were placed in this encounter.  All questions were answered. The patient knows to call the clinic with any problems, questions or concerns. No barriers to learning was detected. I spent 20 minutes  counseling the patient face to face. The total time spent in the appointment was 25 minutes and more than 50% was on counseling and review of test results     Truitt Merle, MD 03/12/2019   I, Joslyn Devon, am acting as scribe for Truitt Merle, MD.   I have reviewed the above documentation for accuracy and completeness, and I agree with the above.

## 2019-03-12 ENCOUNTER — Ambulatory Visit: Payer: Federal, State, Local not specified - PPO

## 2019-03-12 ENCOUNTER — Other Ambulatory Visit: Payer: Self-pay

## 2019-03-12 ENCOUNTER — Inpatient Hospital Stay: Payer: Federal, State, Local not specified - PPO

## 2019-03-12 ENCOUNTER — Inpatient Hospital Stay: Payer: Federal, State, Local not specified - PPO | Admitting: Hematology

## 2019-03-12 ENCOUNTER — Telehealth: Payer: Self-pay | Admitting: Hematology

## 2019-03-12 ENCOUNTER — Encounter: Payer: Self-pay | Admitting: Hematology

## 2019-03-12 ENCOUNTER — Inpatient Hospital Stay: Payer: Federal, State, Local not specified - PPO | Attending: Hematology

## 2019-03-12 ENCOUNTER — Other Ambulatory Visit: Payer: Federal, State, Local not specified - PPO

## 2019-03-12 VITALS — BP 136/64 | HR 77 | Temp 98.6°F | Resp 18 | Ht 66.0 in | Wt 185.1 lb

## 2019-03-12 DIAGNOSIS — C50411 Malignant neoplasm of upper-outer quadrant of right female breast: Secondary | ICD-10-CM | POA: Diagnosis not present

## 2019-03-12 DIAGNOSIS — G620 Drug-induced polyneuropathy: Secondary | ICD-10-CM

## 2019-03-12 DIAGNOSIS — R21 Rash and other nonspecific skin eruption: Secondary | ICD-10-CM | POA: Diagnosis not present

## 2019-03-12 DIAGNOSIS — D509 Iron deficiency anemia, unspecified: Secondary | ICD-10-CM

## 2019-03-12 DIAGNOSIS — G500 Trigeminal neuralgia: Secondary | ICD-10-CM | POA: Insufficient documentation

## 2019-03-12 DIAGNOSIS — Z5112 Encounter for antineoplastic immunotherapy: Secondary | ICD-10-CM | POA: Diagnosis not present

## 2019-03-12 DIAGNOSIS — Z9049 Acquired absence of other specified parts of digestive tract: Secondary | ICD-10-CM | POA: Diagnosis not present

## 2019-03-12 DIAGNOSIS — Z9221 Personal history of antineoplastic chemotherapy: Secondary | ICD-10-CM | POA: Insufficient documentation

## 2019-03-12 DIAGNOSIS — Z79899 Other long term (current) drug therapy: Secondary | ICD-10-CM | POA: Insufficient documentation

## 2019-03-12 DIAGNOSIS — I351 Nonrheumatic aortic (valve) insufficiency: Secondary | ICD-10-CM | POA: Insufficient documentation

## 2019-03-12 DIAGNOSIS — Z170 Estrogen receptor positive status [ER+]: Secondary | ICD-10-CM | POA: Insufficient documentation

## 2019-03-12 DIAGNOSIS — T451X5S Adverse effect of antineoplastic and immunosuppressive drugs, sequela: Secondary | ICD-10-CM | POA: Insufficient documentation

## 2019-03-12 DIAGNOSIS — Z95828 Presence of other vascular implants and grafts: Secondary | ICD-10-CM

## 2019-03-12 DIAGNOSIS — R634 Abnormal weight loss: Secondary | ICD-10-CM | POA: Insufficient documentation

## 2019-03-12 LAB — CMP (CANCER CENTER ONLY)
ALT: 23 U/L (ref 0–44)
AST: 28 U/L (ref 15–41)
Albumin: 3.2 g/dL — ABNORMAL LOW (ref 3.5–5.0)
Alkaline Phosphatase: 97 U/L (ref 38–126)
Anion gap: 9 (ref 5–15)
BUN: 9 mg/dL (ref 8–23)
CO2: 28 mmol/L (ref 22–32)
Calcium: 8.7 mg/dL — ABNORMAL LOW (ref 8.9–10.3)
Chloride: 105 mmol/L (ref 98–111)
Creatinine: 0.72 mg/dL (ref 0.44–1.00)
GFR, Est AFR Am: >60 mL/min (ref >60)
GFR, Estimated: >60 mL/min (ref >60)
Glucose, Bld: 134 mg/dL — ABNORMAL HIGH (ref 70–99)
Potassium: 3.4 mmol/L — ABNORMAL LOW (ref 3.5–5.1)
Sodium: 142 mmol/L (ref 135–145)
Total Bilirubin: 0.4 mg/dL (ref 0.3–1.2)
Total Protein: 6.7 g/dL (ref 6.5–8.1)

## 2019-03-12 LAB — CBC WITH DIFFERENTIAL (CANCER CENTER ONLY)
Abs Immature Granulocytes: 0.01 10*3/uL (ref 0.00–0.07)
Basophils Absolute: 0.1 10*3/uL (ref 0.0–0.1)
Basophils Relative: 1 %
Eosinophils Absolute: 0.4 10*3/uL (ref 0.0–0.5)
Eosinophils Relative: 6 %
HCT: 39.9 % (ref 36.0–46.0)
Hemoglobin: 12.5 g/dL (ref 12.0–15.0)
Immature Granulocytes: 0 %
Lymphocytes Relative: 31 %
Lymphs Abs: 1.9 10*3/uL (ref 0.7–4.0)
MCH: 28.3 pg (ref 26.0–34.0)
MCHC: 31.3 g/dL (ref 30.0–36.0)
MCV: 90.5 fL (ref 80.0–100.0)
Monocytes Absolute: 0.4 10*3/uL (ref 0.1–1.0)
Monocytes Relative: 7 %
Neutro Abs: 3.2 10*3/uL (ref 1.7–7.7)
Neutrophils Relative %: 55 %
Platelet Count: 235 10*3/uL (ref 150–400)
RBC: 4.41 MIL/uL (ref 3.87–5.11)
RDW: 15.2 % (ref 11.5–15.5)
WBC Count: 6 10*3/uL (ref 4.0–10.5)
nRBC: 0 % (ref 0.0–0.2)

## 2019-03-12 LAB — FERRITIN: Ferritin: 64 ng/mL (ref 11–307)

## 2019-03-12 MED ORDER — DIPHENHYDRAMINE HCL 25 MG PO CAPS
ORAL_CAPSULE | ORAL | Status: AC
  Filled 2019-03-12: qty 2

## 2019-03-12 MED ORDER — SODIUM CHLORIDE 0.9 % IV SOLN
Freq: Once | INTRAVENOUS | Status: AC
  Administered 2019-03-12: 12:00:00 via INTRAVENOUS
  Filled 2019-03-12: qty 250

## 2019-03-12 MED ORDER — SODIUM CHLORIDE 0.9 % IV SOLN
3.6000 mg/kg | Freq: Once | INTRAVENOUS | Status: AC
  Administered 2019-03-12: 300 mg via INTRAVENOUS
  Filled 2019-03-12: qty 15

## 2019-03-12 MED ORDER — HEPARIN SOD (PORK) LOCK FLUSH 100 UNIT/ML IV SOLN
500.0000 [IU] | Freq: Once | INTRAVENOUS | Status: AC | PRN
  Administered 2019-03-12: 500 [IU]
  Filled 2019-03-12: qty 5

## 2019-03-12 MED ORDER — ACETAMINOPHEN 325 MG PO TABS
650.0000 mg | ORAL_TABLET | Freq: Once | ORAL | Status: AC
  Administered 2019-03-12: 650 mg via ORAL

## 2019-03-12 MED ORDER — SODIUM CHLORIDE 0.9% FLUSH
10.0000 mL | INTRAVENOUS | Status: DC | PRN
  Administered 2019-03-12: 10 mL
  Filled 2019-03-12: qty 10

## 2019-03-12 MED ORDER — DIPHENHYDRAMINE HCL 25 MG PO CAPS
50.0000 mg | ORAL_CAPSULE | Freq: Once | ORAL | Status: AC
  Administered 2019-03-12: 50 mg via ORAL

## 2019-03-12 MED ORDER — SODIUM CHLORIDE 0.9% FLUSH
10.0000 mL | Freq: Once | INTRAVENOUS | Status: AC
  Administered 2019-03-12: 10 mL
  Filled 2019-03-12: qty 10

## 2019-03-12 MED ORDER — ACETAMINOPHEN 325 MG PO TABS
ORAL_TABLET | ORAL | Status: AC
  Filled 2019-03-12: qty 2

## 2019-03-12 NOTE — Patient Instructions (Signed)
[facility] Discharge Instructions for Patients Receiving Chemotherapy  Today you received the following chemotherapy agents Kadcyla  To help prevent nausea and vomiting after your treatment, we encourage you to take your nausea medication as prescribed If you develop nausea and vomiting that is not controlled by your nausea medication, call the clinic.   BELOW ARE SYMPTOMS THAT SHOULD BE REPORTED IMMEDIATELY:  *FEVER GREATER THAN 100.5 F  *CHILLS WITH OR WITHOUT FEVER  NAUSEA AND VOMITING THAT IS NOT CONTROLLED WITH YOUR NAUSEA MEDICATION  *UNUSUAL SHORTNESS OF BREATH  *UNUSUAL BRUISING OR BLEEDING  TENDERNESS IN MOUTH AND THROAT WITH OR WITHOUT PRESENCE OF ULCERS  *URINARY PROBLEMS  *BOWEL PROBLEMS  UNUSUAL RASH Items with * indicate a potential emergency and should be followed up as soon as possible.  Feel free to call the clinic should you have any questions or concerns. The clinic phone number is [phone].  Please show the CHEMO ALERT CARD at check-in to the Emergency Department and triage nurse.   Ado-Trastuzumab Emtansine for injection What is this medicine? ADO-TRASTUZUMAB EMTANSINE (ADD oh traz TOO zuh mab em TAN zine) is a monoclonal antibody combined with chemotherapy. It is used to treat breast cancer. This medicine may be used for other purposes; ask your health care provider or pharmacist if you have questions. COMMON BRAND NAME(S): Kadcyla What should I tell my health care provider before I take this medicine? They need to know if you have any of these conditions: -heart disease -heart failure -infection (especially a virus infection such as chickenpox, cold sores, or herpes) -liver disease -lung or breathing disease, like asthma -an unusual or allergic reaction to ado-trastuzumab emtansine, other medications, foods, dyes, or preservatives -pregnant or trying to get pregnant -breast-feeding How should I use this medicine? This  medicine is for infusion into a vein. It is given by a health care professional in a hospital or clinic setting. Talk to your pediatrician regarding the use of this medicine in children. Special care may be needed. Overdosage: If you think you have taken too much of this medicine contact a poison control center or emergency room at once. NOTE: This medicine is only for you. Do not share this medicine with others. What if I miss a dose? It is important not to miss your dose. Call your doctor or health care professional if you are unable to keep an appointment. What may interact with this medicine? This medicine may also interact with the following medications: -atazanavir -boceprevir -clarithromycin -delavirdine -indinavir -dalfopristin; quinupristin -isoniazid, INH -itraconazole -ketoconazole -nefazodone -nelfinavir -ritonavir -telaprevir -telithromycin -tipranavir -voriconazole This list may not describe all possible interactions. Give your health care provider a list of all the medicines, herbs, non-prescription drugs, or dietary supplements you use. Also tell them if you smoke, drink alcohol, or use illegal drugs. Some items may interact with your medicine. What should I watch for while using this medicine? Visit your doctor for checks on your progress. This drug may make you feel generally unwell. This is not uncommon, as chemotherapy can affect healthy cells as well as cancer cells. Report any side effects. Continue your course of treatment even though you feel ill unless your doctor tells you to stop. You may need blood work done while you are taking this medicine. Call your doctor or health care professional for advice if you get a fever, chills or sore throat, or other symptoms of a cold or flu. Do not treat yourself. This drug decreases your body's ability   to fight infections. Try to avoid being around people who are sick. Be careful brushing and flossing your teeth or using a  toothpick because you may get an infection or bleed more easily. If you have any dental work done, tell your dentist you are receiving this medicine. Avoid taking products that contain aspirin, acetaminophen, ibuprofen, naproxen, or ketoprofen unless instructed by your doctor. These medicines may hide a fever. Do not become pregnant while taking this medicine or for 7 months after stopping it, men with female partners should use contraception during treatment and for 4 months after the last dose. Women should inform their doctor if they wish to become pregnant or think they might be pregnant. There is a potential for serious side effects to an unborn child. Do not breast-feed an infant while taking this medicine or for 7 months after the last dose. Men who have a partner who is pregnant or who is capable of becoming pregnant should use a condom during sexual activity while taking this medicine and for 4 months after stopping it. Men should inform their doctors if they wish to father a child. This medicine may lower sperm counts. Talk to your health care professional or pharmacist for more information. What side effects may I notice from receiving this medicine? Side effects that you should report to your doctor or health care professional as soon as possible: -allergic reactions like skin rash, itching or hives, swelling of the face, lips, or tongue -breathing problems -chest pain or palpitations -fever or chills, sore throat -general ill feeling or flu-like symptoms -light-colored stools -nausea, vomiting -pain, tingling, numbness in the hands or feet -signs and symptoms of bleeding such as bloody or black, tarry stools; red or dark-brown urine; spitting up blood or brown material that looks like coffee grounds; red spots on the skin; unusual bruising or bleeding from the eye, gums, or nose -swelling of the legs or ankles -yellowing of the eyes or skin Side effects that usually do not require  medical attention (report to your doctor or health care professional if they continue or are bothersome): -changes in taste -constipation -dizziness -headache -joint pain -muscle pain -trouble sleeping -unusually weak or tired This list may not describe all possible side effects. Call your doctor for medical advice about side effects. You may report side effects to FDA at 1-800-FDA-1088. Where should I keep my medicine? This drug is given in a hospital or clinic and will not be stored at home. NOTE: This sheet is a summary. It may not cover all possible information. If you have questions about this medicine, talk to your doctor, pharmacist, or health care provider.  2019 Elsevier/Gold Standard (2015-10-17 12:11:06)    

## 2019-03-12 NOTE — Telephone Encounter (Signed)
Scheduled appt per 7/2 los. No referral has been entered in yet.

## 2019-03-13 ENCOUNTER — Encounter: Payer: Self-pay | Admitting: Hematology

## 2019-03-18 ENCOUNTER — Ambulatory Visit: Payer: Federal, State, Local not specified - PPO

## 2019-03-18 ENCOUNTER — Ambulatory Visit: Payer: Federal, State, Local not specified - PPO | Admitting: Radiation Oncology

## 2019-03-20 ENCOUNTER — Other Ambulatory Visit: Payer: Federal, State, Local not specified - PPO

## 2019-03-20 ENCOUNTER — Ambulatory Visit: Payer: Federal, State, Local not specified - PPO

## 2019-03-20 ENCOUNTER — Ambulatory Visit: Payer: Federal, State, Local not specified - PPO | Admitting: Hematology

## 2019-03-21 ENCOUNTER — Ambulatory Visit (HOSPITAL_COMMUNITY)
Admission: EM | Admit: 2019-03-21 | Discharge: 2019-03-21 | Disposition: A | Discharge status: Home / Self Care | Payer: Federal, State, Local not specified - PPO | Attending: Family Medicine | Admitting: Family Medicine

## 2019-03-21 ENCOUNTER — Other Ambulatory Visit: Payer: Self-pay

## 2019-03-21 DIAGNOSIS — R21 Rash and other nonspecific skin eruption: Secondary | ICD-10-CM | POA: Diagnosis not present

## 2019-03-21 DIAGNOSIS — H9311 Tinnitus, right ear: Secondary | ICD-10-CM

## 2019-03-21 MED ORDER — TRIAMCINOLONE ACETONIDE 0.1 % EX OINT: 1.0000 "application " | TOPICAL_OINTMENT | Freq: Three times a day (TID) | CUTANEOUS | 0 refills | Status: DC

## 2019-03-21 MED ORDER — FAMOTIDINE 40 MG PO TABS: 40.0000 mg | ORAL_TABLET | Freq: Every day | ORAL | 3 refills | Status: DC

## 2019-03-21 NOTE — ED Triage Notes (Signed)
Per pt she has had a popping noise in her right ear for 2 days now. She also noticed a rash on the back of her neck that has spread on both sides. Has been taking benadryl. Rash does itch. Pt is also a cancer patient and is on Chemo.
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
## 2019-03-21 NOTE — Discharge Instructions (Signed)
Follow-up with your PCP regarding obtaining a hearing exam. I suspect the noise in your ear is related to hearing loss.

## 2019-03-23 ENCOUNTER — Encounter: Payer: Self-pay | Admitting: Internal Medicine

## 2019-03-25 ENCOUNTER — Telehealth: Payer: Self-pay | Admitting: Internal Medicine

## 2019-03-25 NOTE — Telephone Encounter (Signed)
Noted  

## 2019-03-25 NOTE — Telephone Encounter (Signed)
Patient is requesting to be seen through [facility].  Please advise.  Preferred Date Range: 03/25/2019 - 04/03/2019  Preferred Times: Any Time  Reason for visit: Request an Appointment  Comments: ringing in ears and itchy rash around neck

## 2019-03-25 NOTE — Telephone Encounter (Signed)
I have scheduled pt for tomorrow and given instruction to call the office for screening and arrival procedures.

## 2019-03-25 NOTE — Telephone Encounter (Signed)
As long as patient is symptom free can be seen in office

## 2019-03-25 NOTE — Progress Notes (Signed)
Location of Breast Cancer: Malignant neoplasm of upper-outer quadrant of right breast in female, estrogen receptor positive (HCC)  Histology per Pathology Report: 11/11/18: Diagnosis Breast, right, needle core biopsy, upper outer - 9:30 o'clock position - INVASIVE DUCTAL CARCINOMA, GRADE II/III.   Receptor Status: Estrogen Receptor: 95%, positive, strong Progesterone Receptor: 5%, positive, strong HER2: Positive (3+) Ki-67: 20%  Did patient present with symptoms (if so, please note symptoms) or was this found on screening mammography?: She had routine screening mammography on 10/30/18 showing a possible abnormality in the right breast. She underwent bilateral diagnostic mammography with tomography and right breast ultrasonography at [facility] on 11/07/18 showing: 1 cm hypoechoic mass with distortion in the UPPER-OUTER RIGHT breast, corresponding to the mammographic abnormality. One RIGHT axillary lymph node with borderline cortical thickness. Tissue sampling of the RIGHT breast mass and borderline RIGHT axillary lymph node recommended.  Biopsy on 11/11/18 showed: invasive ductal carcinoma grade II/III. Prognostic indicators significant for: estrogen receptor, 95% positive and progesterone receptor, 5% positive, both with strong staining intensity. Proliferation marker Ki67 at 20%. HER2 positive.  Past/Anticipated interventions by surgeon, if any: 11/28/18:Procedure: Procedure(s): Blue dye injection right breast, RIGHT BREAST LUMPECTOMY WITH RADIOACTIVE SEED AND RIGHT AXILLARY SENTINEL LYMPH NODE BIOPSY INSERTION PORT-A-CATH WITH ULTRASOUND guidance and fluoroscopy   Surgeon: Hoxworth, Benjamin T    Past/Anticipated interventions by medical oncology, if any: Chemotherapy Per Dr. Feng:  01/02/2019 - 02/13/2019 Chemotherapy   adjuvant chemo weekly Taxol with Herceptin q3weeks starting 01/02/19. Will d/c after 02/13/19 due to worsening neuropathy.       02/20/2019 -  Chemotherapy   Kadcyla  q3weeks starting 02/20/19     Lymphedema issues, if any:  Pt denies any s/s lymphedema  Pain issues, if any:  Pt denies c/o pain.  SAFETY ISSUES:  Prior radiation? No  Pacemaker/ICD? No  Possible current pregnancy? No  Is the patient on methotrexate? No  Current Complaints / other details:  Pt presents today for consult with Dr. Kinard for Radiation Oncology.   BP 135/62 (BP Location: Left Arm, Patient Position: Sitting)   Pulse 75   Temp 98.7 F (37.1 C) (Temporal)   Resp 20   Ht 5' 6" (1.676 m)   Wt 185 lb (83.9 kg)   SpO2 100%   BMI 29.86 kg/m   Wt Readings from Last 3 Encounters:  04/01/19 185 lb (83.9 kg)  03/12/19 185 lb 1.6 oz (84 kg)  02/20/19 185 lb 14.4 oz (84.3 kg)       Jill W Pfefferkorn, RN 04/01/2019,9:51 AM    

## 2019-03-26 ENCOUNTER — Ambulatory Visit: Payer: Federal, State, Local not specified - PPO | Admitting: Internal Medicine

## 2019-03-26 NOTE — Progress Notes (Signed)
Office Visit Note  Patient: Summer [name]             Date of Birth: March 29, 1951           MRN: [id number]             PCP: Hoyt Koch, MD Referring: Hoyt Koch, * Visit Date: 04/09/2019 Occupation: @GUAROCC @  Subjective:  Left knee pain and left trochanteric bursitis   History of Present Illness: Summer [name] is a 68 y.o. female with history of fibromyalgia, DDD, and osteoarthritis.  She presents today with increased left knee joint pain and intermittent swelling.  She had Visco gel injections in bilateral knee joints in February 2020.  She denies any right knee joint pain.  She states that the Visco has been effective but her pain has returned recently.  She is also having left trochanteric bursitis.  She states that she is undergoing chemo and radiation and experiences discomfort after radiation due to the positioning on the table.  She reports that she continues to take Robaxin 500 mg 1 tablet by mouth at bedtime for muscle spasms.  She has been having increased lower back pain recently.  She denies any neck pain or stiffness at this time.  She continues have some generalized muscle aches muscle tenderness due to fibromyalgia.  She states that overall her energy level has been good.  Activities of Daily Living:  Patient reports joint stiffness all day  Patient Reports nocturnal pain.  Difficulty dressing/grooming: Denies Difficulty climbing stairs: Reports Difficulty getting out of chair: Reports Difficulty using hands for taps, buttons, cutlery, and/or writing: Denies  Review of Systems  Constitutional: Negative for fatigue.  HENT: Negative for mouth sores, mouth dryness and nose dryness.   Eyes: Negative for pain, itching, visual disturbance and dryness.  Respiratory: Negative for cough, hemoptysis, shortness of breath, wheezing and difficulty breathing.   Cardiovascular: Negative for chest pain, palpitations, hypertension and swelling in  legs/feet.  Gastrointestinal: Negative for abdominal pain, blood in stool, constipation and diarrhea.  Endocrine: Negative for increased urination.  Genitourinary: Negative for painful urination.  Musculoskeletal: Positive for arthralgias, joint pain, joint swelling and morning stiffness. Negative for myalgias, muscle weakness, muscle tenderness and myalgias.  Skin: Negative for color change, pallor, rash, hair loss, nodules/bumps, redness, skin tightness, ulcers and sensitivity to sunlight.  Allergic/Immunologic: Negative for susceptible to infections.  Neurological: Negative for dizziness, numbness, headaches, memory loss and weakness.  Hematological: Negative for bruising/bleeding tendency and swollen glands.  Psychiatric/Behavioral: Positive for sleep disturbance. Negative for depressed mood and confusion. The patient is not nervous/anxious.     PMFS History:  Patient Active Problem List   Diagnosis Date Noted  . Chest pain of uncertain etiology 32/95/1884  . Bilateral leg edema 02/10/2019  . Port-A-Cath in place 01/30/2019  . Anemia, iron deficiency 11/21/2018  . Malignant neoplasm of upper-outer quadrant of right breast in female, estrogen receptor positive ([place]) 11/17/2018  . Itching 10/13/2018  . Left eye pain 07/01/2018  . Constipation 07/01/2018  . Weight loss 07/22/2017  . Memory loss 12/21/2016  . Tremor 12/21/2016  . Right sided temporal headache 12/21/2016  . Trochanteric bursitis, right hip 10/26/2016  . Pruritus 10/26/2016  . Foot sprain, left, initial encounter 08/31/2016  . Pain of right side of body 08/31/2016  . Headache 04/24/2016  . LLQ pain 04/24/2016  . Glossitis 02/02/2016  . Routine general medical examination at a health care facility 10/27/2015  . Facial pain 10/06/2015  .  Trigeminal neuralgia of right side of face 11/30/2010  . Morbid obesity ([place]) 11/09/2010  . Fibromyalgia 11/09/2010  . HEMATURIA, HX OF 11/14/2009  . LOC OSTEOARTHROS NOT SPEC  PRIM/SEC OTH [facility] SITE 06/27/2009  . Allergic rhinitis 08/26/2008  . 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 Abdominal pain, left lower quadrant 11/09/2009   Qualifier: Diagnosis of  By: Valma Cava LPN, Izora Gala    . Allergic rhinitis 08/26/2008   Qualifier: Diagnosis of  By: Sherren Mocha MD, Jory Ee   . Anemia, iron deficiency 11/21/2018  . Arthritis shoulders and back  . BACK PAIN, CHRONIC 06/13/2007   Qualifier: Diagnosis of  By: Sherren Mocha MD, Jory Ee   . 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 Constipation 07/01/2018  . Costochondritis 05/04/2014  . Diverticulitis of colon 06/13/2007   S/p segmental colectomy for perforated diverticulitis   . Diverticulosis   . Facial pain 10/06/2015  . Fibromyalgia   . Foot sprain, left, initial encounter 08/31/2016  . Glossitis 02/02/2016  . Headache 04/24/2016  . HEMATURIA UNSPECIFIED 06/25/2008   Qualifier: Diagnosis of  By: Sherren Mocha MD, Jory Ee   . HEMATURIA, HX OF 11/14/2009   Qualifier: Diagnosis of  By: Regino Schultze CMA (AAMA), Apolonio Schneiders    . History of kidney stones   . History of kidney stones   . History of shingles 2012--  no residual pain  . Itching 10/13/2018  . Left eye pain 07/01/2018  . Left ureteral calculus   . LLQ pain 04/24/2016  . LOC OSTEOARTHROS NOT SPEC PRIM/SEC OTH [facility] SITE 06/27/2009   Qualifier: Diagnosis of  By: Sherren Mocha MD, Jory Ee   . Malignant neoplasm of upper-outer quadrant of right breast in female, estrogen receptor positive ([place]) 11/17/2018  . MCI (mild cognitive impairment)   . Memory loss 12/21/2016  . Morbid obesity ([place]) 11/09/2010  . Pain of right side of body 08/31/2016  . Pruritus 10/26/2016  . Right sided temporal headache 12/21/2016  . Tremor 12/21/2016  . Trigeminal neuralgia RIGHT  . Trigeminal neuralgia of right side of face 11/30/2010  . Trochanteric bursitis, right hip 10/26/2016  . Urgency of urination   . Weight loss  07/22/2017  . Zoster without complications 7/54/4920    Family History  Problem Relation Age of Onset  . Heart disease Maternal Grandmother   . Diabetes Mother   . Breast cancer Maternal Aunt   . Lupus Cousin   . Colon cancer Neg Hx   . Esophageal cancer Neg Hx   . Rectal cancer Neg Hx   . Stomach cancer Neg Hx    Past Surgical History:  Procedure Laterality Date  . BREAST LUMPECTOMY WITH RADIOACTIVE SEED AND SENTINEL LYMPH NODE BIOPSY Right 11/28/2018   Procedure: RIGHT BREAST LUMPECTOMY WITH RADIOACTIVE SEED AND RIGHT AXILLARY SENTINEL LYMPH NODE BIOPSY;  Surgeon: Excell Seltzer, MD;  Location: [place];  Service: General;  Laterality: Right;  . CEREBRAL MICROVASCULAR DECOMPRESSION  07-05-2006   RIGHT TRIGEMINAL NERVE  . CYSTOSCOPY/RETROGRADE/URETEROSCOPY/STONE EXTRACTION WITH BASKET  07/04/2012   Procedure: CYSTOSCOPY/RETROGRADE/URETEROSCOPY/STONE EXTRACTION WITH BASKET;  Surgeon: Claybon Jabs, MD;  Location: [place];  Service: Urology;  Laterality: Left;  . EXPLORATORY LAPAROTOMY/ RESECTION MID TO DISTAL SIGMOID AND PROXIMAL RECTUM/ END PROXIMAL SIGMOID COLOSTOMY  07-17-2006   PERFORATED DIVERTICULITIS WITH PERITONITIS  . gamma knife  05/17/2016   for trigeminal neuralgia, [facility], Dr Salomon Fick  .  gamma knife  2019  . PORTACATH PLACEMENT Left 11/28/2018   Procedure: INSERTION PORT-A-CATH WITH ULTRASOUND;  Surgeon: Excell Seltzer, MD;  Location: [place];  Service: General;  Laterality: Left;  . RESECTION COLOSTOMY/ CLOSURE COLOSTOMY WITH COLOPROCTOSTOMY  02-20-2007  . TONSILLECTOMY  AS CHILD  . URETEROSCOPY  07/04/2012   Procedure: URETEROSCOPY;  Surgeon: Claybon Jabs, MD;  Location: [facility];  Service: Urology;  Laterality: Left;  Marland Kitchen VAGINAL HYSTERECTOMY  1998   Partial  . WISDOM TOOTH EXTRACTION     Social History   Social History Narrative  . Not on file   Immunization History  Administered Date(s) Administered  . Td  09/10/1998, 06/27/2009  . Tdap 09/07/2018  . Zoster 10/27/2015     Objective: Vital Signs: BP 126/71 (BP Location: Left Arm, Patient Position: Sitting, Cuff Size: Normal)   Pulse 83   Resp 13   Ht 5\' 6"  (1.676 m)   Wt 183 lb (83 kg)   BMI 29.54 kg/m    Physical Exam Vitals signs and nursing note reviewed.  Constitutional:      Appearance: She is well-developed.  HENT:     Head: Normocephalic and atraumatic.  Eyes:     Conjunctiva/sclera: Conjunctivae normal.  Neck:     Musculoskeletal: Normal range of motion.  Cardiovascular:     Rate and Rhythm: Normal rate and regular rhythm.     Heart sounds: Normal heart sounds.  Pulmonary:     Effort: Pulmonary effort is normal.     Breath sounds: Normal breath sounds.  Abdominal:     General: Bowel sounds are normal.     Palpations: Abdomen is soft.  Lymphadenopathy:     Cervical: No cervical adenopathy.  Skin:    General: Skin is warm and dry.     Capillary Refill: Capillary refill takes less than 2 seconds.  Neurological:     Mental Status: She is alert and oriented to person, place, and time.  Psychiatric:        Behavior: Behavior normal.      Musculoskeletal Exam: C-spine, thoracic spine, lumbar spine good range of motion.  No midline spinal tenderness.  No SI joint tenderness.  Shoulder joints, elbow joints, wrist joints, MCPs, PIPs, DIPs good range of motion no synovitis.  She has complete fist formation bilaterally.  Hip joints, knee joints, ankle joints, MCPs, PIPs, DIPs good range of motion no synovitis.  She has mild warmth of the left knee joint.  She has tenderness over the left trochanteric bursa.  No tenderness or swelling of ankle joints.  CDAI Exam: CDAI Score: - Patient Global: -; Provider Global: - Swollen: -; Tender: - Joint Exam   No joint exam has been documented for this visit   There is currently no information documented on the homunculus. Go to the Rheumatology activity and complete the homunculus  joint exam.  Investigation: No additional findings.  Imaging: No results found.  Recent Labs: Lab Results  Component Value Date   WBC 4.9 04/03/2019   HGB 13.0 04/03/2019   PLT 164 04/03/2019   NA 142 04/03/2019   K 3.5 04/03/2019   CL 106 04/03/2019   CO2 29 04/03/2019   GLUCOSE 130 (H) 04/03/2019   BUN 7 (L) 04/03/2019   CREATININE 0.76 04/03/2019   BILITOT 0.7 04/03/2019   ALKPHOS 104 04/03/2019   AST 31 04/03/2019   ALT 19 04/03/2019   PROT 6.6 04/03/2019   ALBUMIN 3.1 (L) 04/03/2019   CALCIUM  9.0 04/03/2019   GFRAA >60 04/03/2019    Speciality Comments: No specialty comments available.  Procedures:  Large Joint Inj: L knee on 04/09/2019 3:34 PM Indications: pain Details: 27 G 1.5 in needle, medial approach  Arthrogram: No  Medications: 1.5 mL lidocaine 1 %; 40 mg triamcinolone acetonide 40 MG/ML Aspirate: 0 mL Outcome: tolerated well, no immediate complications Procedure, treatment alternatives, risks and benefits explained, specific risks discussed. Consent was given by the patient. Immediately prior to procedure a time out was called to verify the correct patient, procedure, equipment, support staff and site/side marked as required. Patient was prepped and draped in the usual sterile fashion.   Large Joint Inj: L greater trochanter on 04/09/2019 3:34 PM Indications: pain Details: 27 G 1.5 in needle, lateral approach  Arthrogram: No  Medications: 40 mg triamcinolone acetonide 40 MG/ML; 1.5 mL lidocaine 1 % Aspirate: 0 mL Outcome: tolerated well, no immediate complications Procedure, treatment alternatives, risks and benefits explained, specific risks discussed. Consent was given by the patient. Immediately prior to procedure a time out was called to verify the correct patient, procedure, equipment, support staff and site/side marked as required. Patient was prepped and draped in the usual sterile fashion.     Allergies: Clindamycin hcl   Assessment /  Plan:     Visit Diagnoses: Primary osteoarthritis of both knees -she had bilateral Synvisc injections in February 2020.  She noted significant improvement following the Visco gel injections.  She has recently started having increased pain in her left knee joint.  She has warmth of the left knee on exam today.  X-rays of the left knee were obtained on 10/02/2018.  X-ray findings were consistent with moderate osteoarthritis and severe chondromalacia patella.  She requested a left knee joint cortisone injection.  She tolerated procedure well.  Procedure note was completed above.  Chronic left knee joint pain: X-rays of the left knee on 10/02/2018 revealed moderate osteoarthritis and severe chondromalacia patella.  She had Synvisc injections in February 2020 which provided significant relief.  Her left knee joint pain has returned and she has warmth on exam today.  She requested a left knee joint cortisone injection.  She tolerated procedure well.  Procedure note was completed above.  Trochanteric bursitis of left hip -She has tenderness over the left trochanteric bursa.  She requested a left trochanteric bursa cortisone injection.  She tolerated the procedure well.  The procedure note was completed above.  She was given a handout of exercises to perform.  DDD (degenerative disc disease), cervical - She has good ROM with no discomfort.  She has no symptoms of radiculopathy.  She has no neck stiffness.   DDD (degenerative disc disease), thoracic - She has no midline spinal tenderness.   DDD (degenerative disc disease), lumbar - She has recently been having increased lower back pain.  She has no midline spinal tenderness.  She has no symptoms of radiculopathy.  She is been experiencing lower back pain after radiation therapy due to positioning.  She has been taking Robaxin 500 mg 1 tablet by mouth at bedtime as needed for muscle spasms.  She has not noticed much improvement since starting on Robaxin.   Fibromyalgia - She continues to have generalized muscle aches muscle tenderness due to fibromyalgia.  Overall her fibromyalgia has been well controlled.  She states that her energy level has been good overall and she has been sleeping well.  She takes Robaxin 500 mg 1 tablet by mouth at bedtime to help  with muscle spasms.  History of insomnia - Overall she has been sleeping well at night.  Other medical conditions are listed as follows:  History of trigeminal neuralgia   History of diverticulitis   History of hematuria -  Orders: Orders Placed This Encounter  Procedures  . Large Joint Inj  . Large Joint Inj   No orders of the defined types were placed in this encounter.   Face-to-face time spent with patient was 30 minutes. Greater than 50% of time was spent in counseling and coordination of care.  Follow-Up Instructions: Return in about 6 months (around 10/10/2019) for Osteoarthritis, Fibromyalgia, DDD.   Ofilia Neas, PA-C  Note - This record has been created using Dragon software.  Chart creation errors have been sought, but may not always  have been located. Such creation errors do not reflect on  the standard of medical care.

## 2019-03-27 NOTE — Progress Notes (Signed)
[place]   Telephone:([phone] Fax:([phone]) [phone]   Clinic Follow up Note   Patient Care Team: Hoyt Koch, MD as PCP - General (Internal Medicine) Mauro Kaufmann, RN as Oncology Nurse Navigator Rockwell Germany, RN as Oncology Nurse Navigator Excell Seltzer, MD as Consulting Physician (General Surgery) Truitt Merle, MD as Consulting Physician (Hematology) Gery Pray, MD as Consulting Physician (Radiation Oncology)  Date of Service:  04/03/2019  CHIEF COMPLAINT: F/u of right breast cancer  SUMMARY OF ONCOLOGIC HISTORY: Oncology History Overview Note  Cancer Staging Malignant neoplasm of upper-outer quadrant of right breast in female, estrogen receptor positive ([place]) Staging form: Breast, AJCC 8th Edition - Clinical stage from 11/10/2018: Stage IA (cT1b, cN0, cM0, G2, ER+, PR+, HER2+) - Signed by Truitt Merle, MD on 11/18/2018     Malignant neoplasm of upper-outer quadrant of right breast in female, estrogen receptor positive ([place])  11/07/2018 Mammogram   Diagnostic 11/07/18 IMPRESSION: 1. 1 x 0.7 x 0.6 cm hypoechoic mass with distortion at the 9-9:30 position of the RIGHT breast 6 cm from the nipple with distortion in the UPPER-OUTER RIGHT breast, corresponding to the mammographic abnormality. One RIGHT axillary lymph node with borderline cortical thickness. Tissue sampling of the RIGHT breast mass and borderline RIGHT axillary lymph node recommended. ADDENDUM: Patient returned today for biopsy of a single borderline thickened lymph node in the RIGHT axilla. I could not reproduce a morphologically abnormal lymph node today, possibly resolved in the interval. Today, only normal appearing lymph nodes were identified in the RIGHT axilla. As such, the axillary ultrasound-guided biopsy was canceled.   11/10/2018 Cancer Staging   Staging form: Breast, AJCC 8th Edition - Clinical stage from 11/10/2018: Stage IA (cT1b, cN0, cM0, G2, ER+, PR+, HER2+) -  Signed by Truitt Merle, MD on 11/18/2018   11/11/2018 Initial Biopsy   Diagnosis 11/11/18 Breast, right, needle core biopsy, upper outer - 9:30 o'clock position - INVASIVE DUCTAL CARCINOMA, GRADE II/III. - SEE MICROSCOPIC DESCRIPTION.   11/11/2018 Receptors her2   Results: IMMUNOHISTOCHEMICAL AND MORPHOMETRIC ANALYSIS PERFORMED MANUALLY The tumor cells are POSITIVE for Her2 (3+). Estrogen Receptor: 95%, POSITIVE, STRONG STAINING INTENSITY Progesterone Receptor: 5%, POSITIVE, STRONG STAINING INTENSITY Proliferation Marker Ki67: 20%   11/17/2018 Initial Diagnosis   Malignant neoplasm of upper-outer quadrant of right breast in female, estrogen receptor positive ([place])   11/28/2018 Surgery   RIGHT BREAST LUMPECTOMY WITH RADIOACTIVE SEED AND RIGHT AXILLARY SENTINEL LYMPH NODE BIOPSY by Dr. Excell Seltzer  11/28/18    11/28/2018 Pathology Results   Diagnosis 11/28/18  1. Breast, lumpectomy, Right w/seed - INVASIVE DUCTAL CARCINOMA, 1.6 CM, [place]TINGHAM GRADE 2 OF 3. - MARGINS OF RESECTION ARE NOT INVOLVED (CLOSEST MARGIN: LESS THAN 1 MM, ANTERIOR). - DUCTAL CARCINOMA IN SITU. - BIOPSY SITE CHANGES. - SEE ONCOLOGY TABLE. 2. Lymph node, sentinel, biopsy, right Axillary - ONE LYMPH NODE, NEGATIVE FOR CARCINOMA (0/1). 3. Lymph node, sentinel, biopsy, right Axillary - ONE LYMPH NODE, NEGATIVE FOR CARCINOMA (0/1).   11/28/2018 Cancer Staging   Staging form: Breast, AJCC 8th Edition - Pathologic stage from 11/28/2018: Stage IA (pT1c, pN0, cM0, G2, ER+, PR+, HER2+) - Signed by Truitt Merle, MD on 12/11/2018   12/22/2018 Procedure   Baseline ECHO 12/22/18  IMPRESSIONS  1. The left ventricle has normal systolic function with an ejection fraction of 60-65%. The cavity size was normal. There is mild concentric left ventricular hypertrophy. Left ventricular diastolic parameters were normal.  2. The right ventricle has normal systolic function. The cavity  was normal. There is no increase in right ventricular wall thickness.   3. Left atrial size was moderately dilated.  4. The aortic valve is tricuspid. Aortic valve regurgitation was not assessed by color flow Doppler.    01/02/2019 - 02/13/2019 Chemotherapy   adjuvant chemo weekly Taxol with Herceptin q3weeks starting 01/02/19. Will d/c after 02/13/19 due to worsening neuropathy.        Radiation Therapy   RT with Dr. Sondra Come starting 04/09/19   02/20/2019 -  Chemotherapy   Kadcyla q3weeks starting 02/20/19      CURRENT THERAPY:  -Adjuvant chemo weekly Taxol with Herceptin starting 01/02/19. Will d/c after 02/13/19 and will switch to Kadcyla q3weeks starting 02/20/19 -IV iron as needed -RT starting 04/09/19   INTERVAL HISTORY:  Summer [name] is here for a follow up and treatment. She presents to the clinic alone. She notes she is doing well. She notes left toes swellings and feeling numb especially in certain shoes. Her right toe is mildly swelling, no numbness. She also notes tingling in her fingertips. She is still able to function adequately. She is taking B complex. She notes feeling rash of the back of her neck. She went to ED for it and was given cream. She saw her PCP who showed she had liquid in the base of her ear which has been irritating her. She plans to be seen by ENT for it.    REVIEW OF SYSTEMS:   Constitutional: Denies fevers, chills or abnormal weight loss Eyes: Denies blurriness of vision Ears, nose, mouth, throat, and face: Denies mucositis or sore throat (+) liquid at base of ear  Respiratory: Denies cough, dyspnea or wheezes Cardiovascular: Denies palpitation, chest discomfort or lower extremity swelling Gastrointestinal:  Denies nausea, heartburn or change in bowel habits Skin: (+) healing rash of posterior neck  Lymphatics: Denies new lymphadenopathy or easy bruising Neurological: (+) numbness of left toes and tingling of b/l fingertips  Behavioral/Psych: Mood is stable, no new changes  All other systems were reviewed with the  patient and are negative.  MEDICAL HISTORY:  Past Medical History:  Diagnosis Date  . Abdominal pain, left lower quadrant 11/09/2009   Qualifier: Diagnosis of  By: Valma Cava LPN, Izora Gala    . Allergic rhinitis 08/26/2008   Qualifier: Diagnosis of  By: Sherren Mocha MD, Jory Ee   . Anemia, iron deficiency 11/21/2018  . Arthritis shoulders and back  . BACK PAIN, CHRONIC 06/13/2007   Qualifier: Diagnosis of  By: Sherren Mocha MD, Jory Ee   . 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 Constipation 07/01/2018  . Costochondritis 05/04/2014  . Diverticulitis of colon 06/13/2007   S/p segmental colectomy for perforated diverticulitis   . Diverticulosis   . Facial pain 10/06/2015  . Fibromyalgia   . Foot sprain, left, initial encounter 08/31/2016  . Glossitis 02/02/2016  . Headache 04/24/2016  . HEMATURIA UNSPECIFIED 06/25/2008   Qualifier: Diagnosis of  By: Sherren Mocha MD, Jory Ee   . HEMATURIA, HX OF 11/14/2009   Qualifier: Diagnosis of  By: Regino Schultze CMA (AAMA), Apolonio Schneiders    . History of kidney stones   . History of kidney stones   . History of shingles 2012--  no residual pain  . Itching 10/13/2018  . Left eye pain 07/01/2018  . Left ureteral calculus   . LLQ pain 04/24/2016  . LOC OSTEOARTHROS NOT SPEC PRIM/SEC OTH [facility] SITE 06/27/2009   Qualifier: Diagnosis of  By: Sherren Mocha  MD, Jory Ee   . Malignant neoplasm of upper-outer quadrant of right breast in female, estrogen receptor positive ([place]) 11/17/2018  . MCI (mild cognitive impairment)   . Memory loss 12/21/2016  . Morbid obesity ([place]) 11/09/2010  . Pain of right side of body 08/31/2016  . Pruritus 10/26/2016  . Right sided temporal headache 12/21/2016  . Tremor 12/21/2016  . Trigeminal neuralgia RIGHT  . Trigeminal neuralgia of right side of face 11/30/2010  . Trochanteric bursitis, right hip 10/26/2016  . Urgency of urination   . Weight loss 07/22/2017  . Zoster without complications 3/33/5456    SURGICAL HISTORY: Past  Surgical History:  Procedure Laterality Date  . BREAST LUMPECTOMY WITH RADIOACTIVE SEED AND SENTINEL LYMPH NODE BIOPSY Right 11/28/2018   Procedure: RIGHT BREAST LUMPECTOMY WITH RADIOACTIVE SEED AND RIGHT AXILLARY SENTINEL LYMPH NODE BIOPSY;  Surgeon: Excell Seltzer, MD;  Location: [place];  Service: General;  Laterality: Right;  . CEREBRAL MICROVASCULAR DECOMPRESSION  07-05-2006   RIGHT TRIGEMINAL NERVE  . CYSTOSCOPY/RETROGRADE/URETEROSCOPY/STONE EXTRACTION WITH BASKET  07/04/2012   Procedure: CYSTOSCOPY/RETROGRADE/URETEROSCOPY/STONE EXTRACTION WITH BASKET;  Surgeon: Claybon Jabs, MD;  Location: [facility];  Service: Urology;  Laterality: Left;  . EXPLORATORY LAPAROTOMY/ RESECTION MID TO DISTAL SIGMOID AND PROXIMAL RECTUM/ END PROXIMAL SIGMOID COLOSTOMY  07-17-2006   PERFORATED DIVERTICULITIS WITH PERITONITIS  . gamma knife  05/17/2016   for trigeminal neuralgia, [facility], Dr Salomon Fick  . gamma knife  2019  . PORTACATH PLACEMENT Left 11/28/2018   Procedure: INSERTION PORT-A-CATH WITH ULTRASOUND;  Surgeon: Excell Seltzer, MD;  Location: [place];  Service: General;  Laterality: Left;  . RESECTION COLOSTOMY/ CLOSURE COLOSTOMY WITH COLOPROCTOSTOMY  02-20-2007  . TONSILLECTOMY  AS CHILD  . URETEROSCOPY  07/04/2012   Procedure: URETEROSCOPY;  Surgeon: Claybon Jabs, MD;  Location[facility];  Service: Urology;  Laterality: Left;  Marland Kitchen VAGINAL HYSTERECTOMY  1998   Partial  . WISDOM TOOTH EXTRACTION      I have reviewed the social history and family history with the patient and they are unchanged from previous note.  ALLERGIES:  is allergic to clindamycin hcl.  MEDICATIONS:  Current Outpatient Medications  Medication Sig Dispense Refill  . acetaminophen (TYLENOL) 500 MG tablet Take 1,000 mg by mouth every 6 (six) hours as needed (pain).     . famotidine (PEPCID) 40 MG tablet Take 1 tablet (40 mg total) by mouth daily. 30 tablet 3  . gabapentin (NEURONTIN)  600 MG tablet TAKE 2 TABLETS TID. MAX OF SIX TABS PER DAY. (Patient taking differently: Take 1,200 mg by mouth 3 (three) times daily. TAKE 2 TABLETS TID. MAX OF SIX TABS PER DAY.) 540 tablet 3  . lidocaine-prilocaine (EMLA) cream Apply to affected area once 30 g 3  . methocarbamol (ROBAXIN) 500 MG tablet Take 1 tablet (500 mg total) by mouth at bedtime as needed for muscle spasms. 30 tablet 1  . metoprolol tartrate (LOPRESSOR) 100 MG tablet Take 1 tablet, 100 mg, 2 hours before your cardiac CT. 1 tablet 0  . omeprazole (PRILOSEC) 40 MG capsule Take 1 capsule (40 mg total) by mouth daily. 30 capsule 5  . oxyCODONE (OXY IR/ROXICODONE) 5 MG immediate release tablet Take 1 tablet (5 mg total) by mouth every 6 (six) hours as needed for moderate pain, severe pain or breakthrough pain. 10 tablet 0  . triamcinolone ointment (KENALOG) 0.1 % Apply 1 application topically 3 (three) times daily. 90 g 0   No current facility-administered medications  for this visit.    Facility-Administered Medications Ordered in Other Visits  Medication Dose Route Frequency Provider Last Rate Last Dose  . ado-trastuzumab emtansine (KADCYLA) 300 mg in sodium chloride 0.9 % 250 mL chemo infusion  3.6 mg/kg (Treatment Plan Recorded) Intravenous Once Truitt Merle, MD      . heparin lock flush 100 unit/mL  500 Units Intracatheter Once PRN Truitt Merle, MD      . sodium chloride flush (NS) 0.9 % injection 10 mL  10 mL Intracatheter PRN Truitt Merle, MD        PHYSICAL EXAMINATION: ECOG PERFORMANCE STATUS: 1 - Symptomatic but completely ambulatory  Vitals:   04/03/19 1007  BP: 139/62  Pulse: 80  Resp: 18  Temp: 98.9 F (37.2 C)  SpO2: 100%   Filed Weights   04/03/19 1007  Weight: 184 lb 9.6 oz (83.7 kg)    GENERAL:alert, no distress and comfortable SKIN: skin color, texture, turgor are normal, no rashes or significant lesions EYES: normal, Conjunctiva are pink and non-injected, sclera clear  NECK: supple, thyroid normal  size, non-tender, without nodularity LYMPH:  no palpable lymphadenopathy in the cervical, axillary  LUNGS: clear to auscultation and percussion with normal breathing effort HEART: regular rate & rhythm and no murmurs and no lower extremity edema ABDOMEN:abdomen soft, non-tender and normal bowel sounds Musculoskeletal:no cyanosis of digits and no clubbing  NEURO: alert & oriented x 3 with fluent speech, no focal motor/sensory deficits  LABORATORY DATA:  I have reviewed the data as listed CBC Latest Ref Rng & Units 04/03/2019 03/12/2019 02/20/2019  WBC 4.0 - 10.5 K/uL 4.9 6.0 7.8  Hemoglobin 12.0 - 15.0 g/dL 13.0 12.5 12.1  Hematocrit 36.0 - 46.0 % 41.2 39.9 39.1  Platelets 150 - 400 K/uL 164 235 273     CMP Latest Ref Rng & Units 04/03/2019 03/12/2019 02/20/2019  Glucose 70 - 99 mg/dL 130(H) 134(H) 92  BUN 8 - 23 mg/dL 7(L) 9 8  Creatinine 0.44 - 1.00 mg/dL 0.76 0.72 0.74  Sodium 135 - 145 mmol/L 142 142 141  Potassium 3.5 - 5.1 mmol/L 3.5 3.4(L) 4.0  Chloride 98 - 111 mmol/L 106 105 104  CO2 22 - 32 mmol/L 29 28 28   Calcium 8.9 - 10.3 mg/dL 9.0 8.7(L) 9.0  Total Protein 6.5 - 8.1 g/dL 6.6 6.7 6.3(L)  Total Bilirubin 0.3 - 1.2 mg/dL 0.7 0.4 0.3  Alkaline Phos 38 - 126 U/L 104 97 90  AST 15 - 41 U/L 31 28 19   ALT 0 - 44 U/L 19 23 22       RADIOGRAPHIC STUDIES: I have personally reviewed the radiological images as listed and agreed with the findings in the report. No results found.   ASSESSMENT & PLAN:  Summer [name] is a 68 y.o. female with   1.Malignant neoplasm of upper-outer quadrant of right breast, StageIA,pT1b,N0,M0, ER/PR+, HER2+, GradeII -She was recently diagnosed in 11/2018. She is s/p right breastlumpectomyand SLNB as of 11/28/18.  -Herpathology results showed her small grade II tumor was completely resected with negative margins and node negative. -She startedadjuvantweekly Taxol with Herceptinon4/24/20butstopped after 6 weeks therapydue to  worsening neuropathyand infusion reaction. -Adjuvant chemo changed to TDM-1 (Kadcyla) q3weeksstarting 02/20/19. If she tolerates we will continue anti-her2 treatment until 12/2019. I will likely switch to maintenance Herceptin later in her treatment or sooner if she has poor tolerance of Kadcyla.  -She is tolerating Kadcyla well overall. Neuropathy is stable.  -She will start RT with Dr. Sondra Come on  04/09/19. I discussed after she completes, I plan to start her on antiestrogen therapy. Will discuss in more detail at that time.  -Labs reviewed, CBC and CMP WNL. Overall adequate to proceed with Kadcyla today. Her potassium was previously low, we discussed how to increase potassium in her diet so she does not need oral potassium supplement.  -F/u in 3 weeks. She can contact clinic if she develops significant and unexpected side effects.  2. Anemia,iron deficiency -Pt notes she has been anemic in the past year -Last colonoscopy was in 04/2012 which wasunremarkable except diverticulosis.  -She had partial colectomy in the past due to severe diverticulosis. -This is possiblyiron deficiency from diverticulosis.She denies any current bleeding.  -After COVID-19 improvesand her chemo treatmentI will refer herbacktoDr. Gessnerfor colonoscopy -Her workup showed iron 17, ferritin 4, normal retic ct, folate Vitamin B12 and SPEP panels. -IV iron was given 3/18/20and 12/12/18.  -will monitor her iron level every month -Hgnormalizedrecently, last ferritin normal as well.   3.Trigeminal neuralgia  -onGabapentin 222m TID -She has been having moderate right side Has which I suspect is exacerbation of her neuralgia secondary to chemo. Will monitor.  -She has oxycodone which she can use for significant pain. -5/26/20brain and neck MRIwere stable.  4.PeripheralNeuropathy, G1 -Secondary to chemoTaxolstarted aftercycle 3. -I discussed neuropathy can progress with more treatment. Will  monitor  -PA Vannpreviouslystarted her on Robaxin. She can take Tylenol in between for breakthrough pain.Continue -Taxol switched toTDM-1 Kadcylaon 02/20/19.   -Her neuropathynow stable with numbness in her left toes and tingling in her fingertips. -Continue B12 complex. I also encouraged her to exercise.   5. Low appetite and weight loss  -Secondary to chemo she has had taste change resulting in low appetite and weight loss -I encouraged her to increase her nutritional supplements to 3-4 a day -Ipreviouslyset up dietician consult. -Her appetite has improved off Taxol and she is eating adequately and able to maintain weight. Stable     PLAN: -Labs reviewed and adequate to proceed withKadcyla today  -Proceed with RT starting 04/09/19  -Lab, flush, f/u and Kadcyla in 3 weeks   No problem-specific Assessment & Plan notes found for this encounter.   No orders of the defined types were placed in this encounter.  All questions were answered. The patient knows to call the clinic with any problems, questions or concerns. No barriers to learning was detected. I spent 20 minutes counseling the patient face to face. The total time spent in the appointment was 25 minutes and more than 50% was on counseling and review of test results     YTruitt Merle MD 04/03/2019   I, AJoslyn Devon am acting as scribe for YTruitt Merle MD.   I have reviewed the above documentation for accuracy and completeness, and I agree with the above.

## 2019-04-01 ENCOUNTER — Encounter: Payer: Self-pay | Admitting: Radiation Oncology

## 2019-04-01 ENCOUNTER — Other Ambulatory Visit: Payer: Self-pay

## 2019-04-01 ENCOUNTER — Ambulatory Visit
Admission: RE | Admit: 2019-04-01 | Discharge: 2019-04-01 | Disposition: A | Discharge status: Home / Self Care | Payer: Federal, State, Local not specified - PPO | Source: Ambulatory Visit | Attending: Radiation Oncology | Admitting: Radiation Oncology

## 2019-04-01 VITALS — BP 135/62 | HR 75 | Temp 98.7°F | Resp 20 | Ht 66.0 in | Wt 185.0 lb

## 2019-04-01 DIAGNOSIS — Z170 Estrogen receptor positive status [ER+]: Secondary | ICD-10-CM | POA: Diagnosis not present

## 2019-04-01 DIAGNOSIS — Z79899 Other long term (current) drug therapy: Secondary | ICD-10-CM | POA: Insufficient documentation

## 2019-04-01 DIAGNOSIS — Z510 Encounter for antineoplastic radiation therapy: Secondary | ICD-10-CM | POA: Diagnosis not present

## 2019-04-01 DIAGNOSIS — T451X5A Adverse effect of antineoplastic and immunosuppressive drugs, initial encounter: Secondary | ICD-10-CM | POA: Diagnosis not present

## 2019-04-01 DIAGNOSIS — C50411 Malignant neoplasm of upper-outer quadrant of right female breast: Secondary | ICD-10-CM | POA: Insufficient documentation

## 2019-04-01 DIAGNOSIS — G620 Drug-induced polyneuropathy: Secondary | ICD-10-CM | POA: Diagnosis not present

## 2019-04-01 NOTE — Progress Notes (Signed)
Radiation Oncology         [phone]) [id number] ________________________________  Name: Summer [name] MRN: [id number]  Date: 04/01/2019  DOB: 1951/09/05  Re-Evaluation Note  CC: Hoyt Koch, MD  Truitt Merle, MD    ICD-10-CM   1. 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 She was seen in the multidisciplinary breast clinic on 11/19/2018.   She opted to proceed with right breast lumpectomy with lymph node biopsy on 11/28/2018. Pathology from the procedure revealed: invasive ductal carcinoma, 1.6 cm, grade 2; margins of resection not involved; ductal carcinoma in situ; two negative lymph nodes (0/2).   She has been treated with adjuvant chemotherapy under Dr. Burr Medico. She received weekly taxol with herceptin every 3 weeks from 01/02/2019 - 02/13/2019. She was switched to Kadcyla every 3 weeks on 02/20/2019 due to worsening neuropathy.  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 She denies pain, any signs or symptoms of lymphedema and any other symptoms.    Allergies:  is allergic to clindamycin hcl.  Meds: Current Outpatient Medications  Medication Sig Dispense Refill  . acetaminophen (TYLENOL) 500 MG tablet Take 1,000 mg by mouth every 6 (six) hours as needed (pain).     . famotidine (PEPCID) 40 MG tablet Take 1 tablet (40 mg total) by mouth daily. 30 tablet 3  . gabapentin (NEURONTIN) 600 MG tablet TAKE 2 TABLETS TID. MAX OF SIX TABS PER DAY. (Patient taking differently: Take 1,200 mg by mouth 3 (three) times daily. TAKE 2 TABLETS TID. MAX OF SIX TABS PER DAY.) 540 tablet 3  . lidocaine-prilocaine (EMLA) cream Apply to affected area  once 30 g 3  . methocarbamol (ROBAXIN) 500 MG tablet Take 1 tablet (500 mg total) by mouth at bedtime as needed for muscle spasms. 30 tablet 1  . omeprazole (PRILOSEC) 40 MG capsule Take 1 capsule (40 mg total) by mouth daily. 30 capsule 5  . oxyCODONE (OXY IR/ROXICODONE) 5 MG immediate release tablet Take 1 tablet (5 mg total) by mouth every 6 (six) hours as needed for moderate pain, severe pain or breakthrough pain. 10 tablet 0  . triamcinolone ointment (KENALOG) 0.1 % Apply 1 application topically 3 (three) times daily. 90 g 0  . metoprolol tartrate (LOPRESSOR) 100 MG tablet Take 1 tablet, 100 mg, 2 hours before your cardiac CT. (Patient not taking: Reported on 04/01/2019) 1 tablet 0   No current facility-administered medications for this encounter.     Physical Findings: The patient is in no acute distress. Patient is alert and oriented.  height is 5' 6" (1.676 m) and weight is 185 lb (83.9 kg). Her temporal temperature is 98.7 F (37.1 C). Her blood pressure is 135/62 and her pulse is 75. Her respiration is 20 and oxygen saturation is 100%.  No significant changes. Lungs are clear to auscultation bilaterally. Heart has regular rate and rhythm. No palpable cervical, supraclavicular, or axillary adenopathy. Abdomen soft, non-tender, normal bowel sounds. Left Breast: large and pendulous with no palpable mass, nipple discharge or bleeding. Right Breast: large and pendulous without palpable mass, nipple discharge or bleeding. She has a scar in the lateral aspect of the breast  from her lumpectomy which has healed nicely. She also has a separate axillary scar which is also well-healed.  Lab Findings: Lab Results  Component Value Date   WBC 6.0 03/12/2019   HGB 12.5 03/12/2019   HCT 39.9 03/12/2019   MCV 90.5 03/12/2019   PLT 235 03/12/2019    Radiographic Findings: Ct Coronary Morph W/cta Cor W/score W/ca W/cm &/or Wo/cm  Addendum Date: 03/09/2019   ADDENDUM REPORT: 03/09/2019 12:17  HISTORY: Chest Pain EXAM: Cardiac/Coronary  CT TECHNIQUE: The patient was scanned on a [employer]. PROTOCOL: A 120 kV prospective scan was triggered in the descending thoracic aorta at 111 HU's. Axial non-contrast 3 mm slices were carried out through the heart. The data set was analyzed on a dedicated work station and scored using the [place]. Gantry rotation speed was 250 msecs and collimation was .6 mm. Beta blockade and 0.8 mg of sl NTG was given. The 3D data set was reconstructed in 5% intervals of the 67-82 % of the R-R cycle. Diastolic phases were analyzed on a dedicated work station using MPR, MIP and VRT modes. The patient received 130m OMNIPAQUE IOHEXOL 350 MG/ML SOLN of contrast. FINDINGS: Coronary calcium score: The patient's coronary artery calcium score is 0. Coronary arteries: Normal coronary origins.  Right dominance. Right Coronary Artery: No detectable plaque or stenosis. Left Main Coronary Artery: No detectable plaque or stenosis. Left Anterior Descending Coronary Artery: No detectable plaque or stenosis. Left Circumflex Artery: No detectable plaque or stenosis. Aorta: Normal size, 28 mm at the mid ascending aorta (level of the PA bifurcation) measured double oblique. No calcifications. No dissection. Aortic Valve: No calcifications. Other findings: Normal pulmonary vein drainage into the left atrium. Normal left atrial appendage without a thrombus. Normal size of the pulmonary artery. Port-A-Cath in SVC IMPRESSION: 1. No evidence of CAD, CADRADS = 0. 2. Coronary calcium score of 0. This was 0 percentile for age and sex matched control. 3. Normal coronary origin with right dominance. Electronically Signed   By: GCherlynn Kaiser  On: 03/09/2019 12:17   Result Date: 03/09/2019 EXAM: OVER-READ INTERPRETATION  CT CHEST The following report is an over-read performed by radiologist Dr. DVinnie Langtonof [facility], [place]ah[place]on 03/09/2019. This over-read does not include  interpretation of cardiac or coronary anatomy or pathology. The coronary calcium score/coronary CTA interpretation by the cardiologist is attached. COMPARISON:  None. FINDINGS: Tip of Port-A-Cath at superior cavoatrial junction. Within the visualized portions of the thorax there are no suspicious appearing pulmonary nodules or masses, there is no acute consolidative airspace disease, no pleural effusions, no pneumothorax and no lymphadenopathy. Visualized portions of the upper abdomen are unremarkable. There are no aggressive appearing lytic or blastic lesions noted in the visualized portions of the skeleton. IMPRESSION: No significant incidental noncardiac findings are noted. Electronically Signed: By: DVinnie LangtonM.D. 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 Given the patient's large, pendulous breasts, she will be treated in a prone position. She is not a candidate for hypofractionated radiotherapy given her large breast size.  Plan:  Patient is scheduled for CT simulation later this morning. Anticipate 6.5 weeks of radiation therapy.  ____________________________________ JGery Pray MD   This document serves as a record of services personally performed by JGery Pray MD. It was  created on his behalf by Wilburn Mylar, a trained medical scribe. The creation of this record is based on the scribe's personal observations and the provider's statements to them. This document has been checked and approved by the attending provider.

## 2019-04-01 NOTE — Patient Instructions (Signed)
Coronavirus (COVID-19) Are you at risk?  Are you at risk for the Coronavirus (COVID-19)?  To be considered HIGH RISK for Coronavirus (COVID-19), you have to meet the following criteria:  . Traveled to [place], [place], [place], [place] or [place]; or in the [place] to [place], [place], [place], or [place]; and have fever, cough, and shortness of breath within the last 2 weeks of travel OR . Been in close contact with a person diagnosed with COVID-19 within the last 2 weeks and have fever, cough, and shortness of breath . IF YOU DO NOT MEET THESE CRITERIA, YOU ARE CONSIDERED LOW RISK FOR COVID-19.  What to do if you are HIGH RISK for COVID-19?  . If you are having a medical emergency, call 911. . Seek medical care right away. Before you go to a doctor's office, urgent care or emergency department, call ahead and tell them about your recent travel, contact with someone diagnosed with COVID-19, and your symptoms. You should receive instructions from your physician's office regarding next steps of care.  . When you arrive at healthcare provider, tell the healthcare staff immediately you have returned from visiting [place], [place], [place], [place] or [place]; or traveled in the [place] to [place], [place], [place], or [place]; in the last two weeks or you have been in close contact with a person diagnosed with COVID-19 in the last 2 weeks.   . Tell the health care staff about your symptoms: fever, cough and shortness of breath. . After you have been seen by a medical provider, you will be either: o Tested for (COVID-19) and discharged home on quarantine except to seek medical care if symptoms worsen, and asked to  - Stay home and avoid contact with others until you get your results (4-5 days)  - Avoid travel on public transportation if possible (such as bus, train, or airplane) or o Sent to the Emergency Department by EMS for evaluation, COVID-19 testing, and possible  admission depending on your condition and test results.  What to do if you are LOW RISK for COVID-19?  Reduce your risk of any infection by using the same precautions used for avoiding the common cold or flu:  . Wash your hands often with soap and warm water for at least 20 seconds.  If soap and water are not readily available, use an alcohol-based hand sanitizer with at least 60% alcohol.  . If coughing or sneezing, cover your mouth and nose by coughing or sneezing into the elbow areas of your shirt or coat, into a tissue or into your sleeve (not your hands). . Avoid shaking hands with others and consider head nods or verbal greetings only. . Avoid touching your eyes, nose, or mouth with unwashed hands.  . Avoid close contact with people who are sick. . Avoid places or events with large numbers of people in one location, like concerts or sporting events. . Carefully consider travel plans you have or are making. . If you are planning any travel outside or inside the [place], visit the CDC's Travelers' Health webpage for the latest health notices. . If you have some symptoms but not all symptoms, continue to monitor at home and seek medical attention if your symptoms worsen. . If you are having a medical emergency, call 911.   ADDITIONAL HEALTHCARE OPTIONS FOR PATIENTS  [facility] / e-Visit: [email or web address]         [facility]: [phone]  [facility]: [phone]                   [facility]: [phone]   

## 2019-04-02 ENCOUNTER — Ambulatory Visit: Payer: Self-pay | Admitting: Rheumatology

## 2019-04-03 ENCOUNTER — Inpatient Hospital Stay: Payer: Federal, State, Local not specified - PPO

## 2019-04-03 ENCOUNTER — Inpatient Hospital Stay: Payer: Federal, State, Local not specified - PPO | Admitting: Hematology

## 2019-04-03 ENCOUNTER — Other Ambulatory Visit: Payer: Self-pay

## 2019-04-03 ENCOUNTER — Encounter: Payer: Self-pay | Admitting: Hematology

## 2019-04-03 VITALS — BP 139/62 | HR 80 | Temp 98.9°F | Resp 18 | Ht 66.0 in | Wt 184.6 lb

## 2019-04-03 DIAGNOSIS — D509 Iron deficiency anemia, unspecified: Secondary | ICD-10-CM | POA: Diagnosis not present

## 2019-04-03 DIAGNOSIS — Z170 Estrogen receptor positive status [ER+]: Secondary | ICD-10-CM

## 2019-04-03 DIAGNOSIS — C50411 Malignant neoplasm of upper-outer quadrant of right female breast: Secondary | ICD-10-CM

## 2019-04-03 DIAGNOSIS — Z9049 Acquired absence of other specified parts of digestive tract: Secondary | ICD-10-CM

## 2019-04-03 DIAGNOSIS — G620 Drug-induced polyneuropathy: Secondary | ICD-10-CM | POA: Diagnosis not present

## 2019-04-03 DIAGNOSIS — Z95828 Presence of other vascular implants and grafts: Secondary | ICD-10-CM

## 2019-04-03 DIAGNOSIS — R21 Rash and other nonspecific skin eruption: Secondary | ICD-10-CM

## 2019-04-03 DIAGNOSIS — Z79899 Other long term (current) drug therapy: Secondary | ICD-10-CM

## 2019-04-03 DIAGNOSIS — T451X5S Adverse effect of antineoplastic and immunosuppressive drugs, sequela: Secondary | ICD-10-CM

## 2019-04-03 DIAGNOSIS — G500 Trigeminal neuralgia: Secondary | ICD-10-CM

## 2019-04-03 DIAGNOSIS — I351 Nonrheumatic aortic (valve) insufficiency: Secondary | ICD-10-CM

## 2019-04-03 LAB — CMP (CANCER CENTER ONLY)
ALT: 19 U/L (ref 0–44)
AST: 31 U/L (ref 15–41)
Albumin: 3.1 g/dL — ABNORMAL LOW (ref 3.5–5.0)
Alkaline Phosphatase: 104 U/L (ref 38–126)
Anion gap: 7 (ref 5–15)
BUN: 7 mg/dL — ABNORMAL LOW (ref 8–23)
CO2: 29 mmol/L (ref 22–32)
Calcium: 9 mg/dL (ref 8.9–10.3)
Chloride: 106 mmol/L (ref 98–111)
Creatinine: 0.76 mg/dL (ref 0.44–1.00)
GFR, Est AFR Am: >60 mL/min (ref >60)
GFR, Estimated: >60 mL/min (ref >60)
Glucose, Bld: 130 mg/dL — ABNORMAL HIGH (ref 70–99)
Potassium: 3.5 mmol/L (ref 3.5–5.1)
Sodium: 142 mmol/L (ref 135–145)
Total Bilirubin: 0.7 mg/dL (ref 0.3–1.2)
Total Protein: 6.6 g/dL (ref 6.5–8.1)

## 2019-04-03 LAB — CBC WITH DIFFERENTIAL/PLATELET
Abs Immature Granulocytes: 0.01 10*3/uL (ref 0.00–0.07)
Basophils Absolute: 0.1 10*3/uL (ref 0.0–0.1)
Basophils Relative: 1 %
Eosinophils Absolute: 0.2 10*3/uL (ref 0.0–0.5)
Eosinophils Relative: 4 %
HCT: 41.2 % (ref 36.0–46.0)
Hemoglobin: 13 g/dL (ref 12.0–15.0)
Immature Granulocytes: 0 %
Lymphocytes Relative: 43 %
Lymphs Abs: 2.1 10*3/uL (ref 0.7–4.0)
MCH: 28.9 pg (ref 26.0–34.0)
MCHC: 31.6 g/dL (ref 30.0–36.0)
MCV: 91.6 fL (ref 80.0–100.0)
Monocytes Absolute: 0.4 10*3/uL (ref 0.1–1.0)
Monocytes Relative: 9 %
Neutro Abs: 2.1 10*3/uL (ref 1.7–7.7)
Neutrophils Relative %: 43 %
Platelets: 164 10*3/uL (ref 150–400)
RBC: 4.5 MIL/uL (ref 3.87–5.11)
RDW: 13.8 % (ref 11.5–15.5)
WBC: 4.9 10*3/uL (ref 4.0–10.5)
nRBC: 0 % (ref 0.0–0.2)

## 2019-04-03 MED ORDER — HEPARIN SOD (PORK) LOCK FLUSH 100 UNIT/ML IV SOLN
500.0000 [IU] | Freq: Once | INTRAVENOUS | Status: AC | PRN
  Administered 2019-04-03: 500 [IU]
  Filled 2019-04-03: qty 5

## 2019-04-03 MED ORDER — SODIUM CHLORIDE 0.9% FLUSH
10.0000 mL | INTRAVENOUS | Status: DC | PRN
  Administered 2019-04-03: 10 mL
  Filled 2019-04-03: qty 10

## 2019-04-03 MED ORDER — DIPHENHYDRAMINE HCL 25 MG PO CAPS
50.0000 mg | ORAL_CAPSULE | Freq: Once | ORAL | Status: AC
  Administered 2019-04-03: 50 mg via ORAL

## 2019-04-03 MED ORDER — DIPHENHYDRAMINE HCL 25 MG PO CAPS
ORAL_CAPSULE | ORAL | Status: AC
  Filled 2019-04-03: qty 2

## 2019-04-03 MED ORDER — ACETAMINOPHEN 325 MG PO TABS
650.0000 mg | ORAL_TABLET | Freq: Once | ORAL | Status: AC
  Administered 2019-04-03: 650 mg via ORAL

## 2019-04-03 MED ORDER — SODIUM CHLORIDE 0.9 % IV SOLN
Freq: Once | INTRAVENOUS | Status: AC
  Administered 2019-04-03: 11:00:00 via INTRAVENOUS
  Filled 2019-04-03: qty 250

## 2019-04-03 MED ORDER — ACETAMINOPHEN 325 MG PO TABS
ORAL_TABLET | ORAL | Status: AC
  Filled 2019-04-03: qty 2

## 2019-04-03 MED ORDER — SODIUM CHLORIDE 0.9 % IV SOLN
3.6000 mg/kg | Freq: Once | INTRAVENOUS | Status: AC
  Administered 2019-04-03: 300 mg via INTRAVENOUS
  Filled 2019-04-03: qty 15

## 2019-04-03 MED ORDER — SODIUM CHLORIDE 0.9% FLUSH
10.0000 mL | Freq: Once | INTRAVENOUS | Status: AC
  Administered 2019-04-03: 10 mL
  Filled 2019-04-03: qty 10

## 2019-04-03 NOTE — Patient Instructions (Signed)
[facility] Discharge Instructions for Patients Receiving Chemotherapy  Today you received the following chemotherapy agents Ado-trastuzumab (KADCYLA).  To help prevent nausea and vomiting after your treatment, we encourage you to take your nausea medication as prescribed.   If you develop nausea and vomiting that is not controlled by your nausea medication, call the clinic.   BELOW ARE SYMPTOMS THAT SHOULD BE REPORTED IMMEDIATELY:  *FEVER GREATER THAN 100.5 F  *CHILLS WITH OR WITHOUT FEVER  NAUSEA AND VOMITING THAT IS NOT CONTROLLED WITH YOUR NAUSEA MEDICATION  *UNUSUAL SHORTNESS OF BREATH  *UNUSUAL BRUISING OR BLEEDING  TENDERNESS IN MOUTH AND THROAT WITH OR WITHOUT PRESENCE OF ULCERS  *URINARY PROBLEMS  *BOWEL PROBLEMS  UNUSUAL RASH Items with * indicate a potential emergency and should be followed up as soon as possible.  Feel free to call the clinic should you have any questions or concerns. The clinic phone number is [phone].  Please show the CHEMO ALERT CARD at check-in to the Emergency Department and triage nurse.  Coronavirus (COVID-19) Are you at risk?  Are you at risk for the Coronavirus (COVID-19)?  To be considered HIGH RISK for Coronavirus (COVID-19), you have to meet the following criteria:  . Traveled to [place], [place], [place], [place] or [place]; or in the [place] to [place], [place], [place], or [place]; and have fever, cough, and shortness of breath within the last 2 weeks of travel OR . Been in close contact with a person diagnosed with COVID-19 within the last 2 weeks and have fever, cough, and shortness of breath . IF YOU DO NOT MEET THESE CRITERIA, YOU ARE CONSIDERED LOW RISK FOR COVID-19.  What to do if you are HIGH RISK for COVID-19?  . If you are having a medical emergency, call 911. . Seek medical care right away. Before you go to a doctor's office, urgent care or emergency department, call ahead and tell  them about your recent travel, contact with someone diagnosed with COVID-19, and your symptoms. You should receive instructions from your physician's office regarding next steps of care.  . When you arrive at healthcare provider, tell the healthcare staff immediately you have returned from visiting [place], [place], [place], [place] or [place]; or traveled in the [place] to [place], [place], [place], or [place]; in the last two weeks or you have been in close contact with a person diagnosed with COVID-19 in the last 2 weeks.   . Tell the health care staff about your symptoms: fever, cough and shortness of breath. . After you have been seen by a medical provider, you will be either: o Tested for (COVID-19) and discharged home on quarantine except to seek medical care if symptoms worsen, and asked to  - Stay home and avoid contact with others until you get your results (4-5 days)  - Avoid travel on public transportation if possible (such as bus, train, or airplane) or o Sent to the Emergency Department by EMS for evaluation, COVID-19 testing, and possible admission depending on your condition and test results.  What to do if you are LOW RISK for COVID-19?  Reduce your risk of any infection by using the same precautions used for avoiding the common cold or flu:  . Wash your hands often with soap and warm water for at least 20 seconds.  If soap and water are not readily available, use an alcohol-based hand sanitizer with at least 60% alcohol.  . If coughing or   sneezing, cover your mouth and nose by coughing or sneezing into the elbow areas of your shirt or coat, into a tissue or into your sleeve (not your hands). . Avoid shaking hands with others and consider head nods or verbal greetings only. . Avoid touching your eyes, nose, or mouth with unwashed hands.  . Avoid close contact with people who are sick. . Avoid places or events with large numbers of people in one location, like concerts or  sporting events. . Carefully consider travel plans you have or are making. . If you are planning any travel outside or inside the [place], visit the CDC's Travelers' Health webpage for the latest health notices. . If you have some symptoms but not all symptoms, continue to monitor at home and seek medical attention if your symptoms worsen. . If you are having a medical emergency, call 911.   [place] / e-Visit: [email or web address]         [facility]: [facility]: [phone]                   [facility]: [phone]

## 2019-04-06 ENCOUNTER — Telehealth: Payer: Self-pay | Admitting: Hematology

## 2019-04-06 DIAGNOSIS — C50411 Malignant neoplasm of upper-outer quadrant of right female breast: Secondary | ICD-10-CM | POA: Diagnosis not present

## 2019-04-06 NOTE — Telephone Encounter (Signed)
No los per 7/24.

## 2019-04-08 ENCOUNTER — Ambulatory Visit
Admission: RE | Admit: 2019-04-08 | Discharge: 2019-04-08 | Disposition: A | Discharge status: Home / Self Care | Payer: Federal, State, Local not specified - PPO | Source: Ambulatory Visit | Attending: Radiation Oncology | Admitting: Radiation Oncology

## 2019-04-08 ENCOUNTER — Other Ambulatory Visit: Payer: Self-pay

## 2019-04-08 DIAGNOSIS — C50411 Malignant neoplasm of upper-outer quadrant of right female breast: Secondary | ICD-10-CM | POA: Diagnosis not present

## 2019-04-08 DIAGNOSIS — Z170 Estrogen receptor positive status [ER+]: Secondary | ICD-10-CM

## 2019-04-08 NOTE — Progress Notes (Signed)
  Radiation Oncology         [phone]) [phone] ________________________________  Name: Summer [name] MRN: [id number]  Date: 04/08/2019  DOB: 1951/04/16  Simulation Verification Note    ICD-10-CM   1. Malignant neoplasm of upper-outer quadrant of right breast in female, estrogen receptor positive ([place])  C50.411    Z17.0     Status: outpatient  NARRATIVE: The patient was brought to the treatment unit and placed in the planned treatment position. The clinical setup was verified. Then port films were obtained and uploaded to the radiation oncology medical record software.  The treatment beams were carefully compared against the planned radiation fields. The position location and shape of the radiation fields was reviewed. They targeted volume of tissue appears to be appropriately covered by the radiation beams. Organs at risk appear to be excluded as planned.  Based on my personal review, I approved the simulation verification. The patient's treatment will proceed as planned.  -----------------------------------  Blair Promise, PhD, MD  This document serves as a record of services personally performed by Gery Pray, MD. It was created on his behalf by Mary-Margaret Loma Messing, a trained medical scribe. The creation of this record is based on the scribe's personal observations and the provider's statements to them. This document has been checked and approved by the attending provider.

## 2019-04-09 ENCOUNTER — Ambulatory Visit: Payer: Federal, State, Local not specified - PPO | Admitting: Physician Assistant

## 2019-04-09 ENCOUNTER — Ambulatory Visit: Payer: Federal, State, Local not specified - PPO | Admitting: Internal Medicine

## 2019-04-09 ENCOUNTER — Encounter: Payer: Self-pay | Admitting: Physician Assistant

## 2019-04-09 ENCOUNTER — Other Ambulatory Visit: Payer: Self-pay

## 2019-04-09 ENCOUNTER — Ambulatory Visit
Admission: RE | Admit: 2019-04-09 | Discharge: 2019-04-09 | Disposition: A | Discharge status: Home / Self Care | Payer: Federal, State, Local not specified - PPO | Source: Ambulatory Visit | Attending: Radiation Oncology | Admitting: Radiation Oncology

## 2019-04-09 VITALS — BP 126/71 | HR 83 | Resp 13 | Ht 66.0 in | Wt 183.0 lb

## 2019-04-09 DIAGNOSIS — G8929 Other chronic pain: Secondary | ICD-10-CM

## 2019-04-09 DIAGNOSIS — M5136 Other intervertebral disc degeneration, lumbar region: Secondary | ICD-10-CM

## 2019-04-09 DIAGNOSIS — Z8719 Personal history of other diseases of the digestive system: Secondary | ICD-10-CM

## 2019-04-09 DIAGNOSIS — C50411 Malignant neoplasm of upper-outer quadrant of right female breast: Secondary | ICD-10-CM | POA: Diagnosis not present

## 2019-04-09 DIAGNOSIS — M7062 Trochanteric bursitis, left hip: Secondary | ICD-10-CM

## 2019-04-09 DIAGNOSIS — M797 Fibromyalgia: Secondary | ICD-10-CM

## 2019-04-09 DIAGNOSIS — M5030 Other cervical disc degeneration, unspecified cervical region: Secondary | ICD-10-CM

## 2019-04-09 DIAGNOSIS — Z8669 Personal history of other diseases of the nervous system and sense organs: Secondary | ICD-10-CM

## 2019-04-09 DIAGNOSIS — Z87898 Personal history of other specified conditions: Secondary | ICD-10-CM

## 2019-04-09 DIAGNOSIS — M170 Bilateral primary osteoarthritis of knee: Secondary | ICD-10-CM

## 2019-04-09 DIAGNOSIS — M5134 Other intervertebral disc degeneration, thoracic region: Secondary | ICD-10-CM

## 2019-04-09 DIAGNOSIS — Z87448 Personal history of other diseases of urinary system: Secondary | ICD-10-CM

## 2019-04-09 DIAGNOSIS — M25562 Pain in left knee: Secondary | ICD-10-CM

## 2019-04-09 NOTE — Patient Instructions (Signed)
 Hip Bursitis Rehab Ask your health care provider which exercises are safe for you. Do exercises exactly as told by your health care provider and adjust them as directed. It is normal to feel mild stretching, pulling, tightness, or discomfort as you do these exercises. Stop right away if you feel sudden pain or your pain gets worse. Do not begin these exercises until told by your health care provider. Stretching exercise This exercise warms up your muscles and joints and improves the movement and flexibility of your hip. This exercise also helps to relieve pain and stiffness. Iliotibial band stretch An iliotibial band is a strong band of muscle tissue that runs from the outer side of your hip to the outer side of your thigh and knee. 1. Lie on your side with your left / right leg in the top position. 2. Bend your left / right knee and grab your ankle. Stretch out your bottom arm to help you balance. 3. Slowly bring your knee back so your thigh is behind your body. 4. Slowly lower your knee toward the floor until you feel a gentle stretch on the outside of your left / right thigh. If you do not feel a stretch and your knee will not fall farther, place the heel of your other foot on top of your knee and pull your knee down toward the floor with your foot. 5. Hold this position for __________ seconds. 6. Slowly return to the starting position. Repeat __________ times. Complete this exercise __________ times a day. Strengthening exercises These exercises build strength and endurance in your hip and pelvis. Endurance is the ability to use your muscles for a long time, even after they get tired. Bridge This exercise strengthens the muscles that move your thigh backward (hip extensors). 1. Lie on your back on a firm surface with your knees bent and your feet flat on the floor. 2. Tighten your buttocks muscles and lift your buttocks off the floor until your trunk is level with your thighs. ? Do not  arch your back. ? You should feel the muscles working in your buttocks and the back of your thighs. If you do not feel these muscles, slide your feet 1-2 inches (2.5-5 cm) farther away from your buttocks. ? If this exercise is too easy, try doing it with your arms crossed over your chest. 3. Hold this position for __________ seconds. 4. Slowly lower your hips to the starting position. 5. Let your muscles relax completely after each repetition. Repeat __________ times. Complete this exercise __________ times a day. Squats This exercise strengthens the muscles in front of your thigh and knee (quadriceps). 1. Stand in front of a table, with your feet and knees pointing straight ahead. You may rest your hands on the table for balance but not for support. 2. Slowly bend your knees and lower your hips like you are going to sit in a chair. ? Keep your weight over your heels, not over your toes. ? Keep your lower legs upright so they are parallel with the table legs. ? Do not let your hips go lower than your knees. ? Do not bend lower than told by your health care provider. ? If your hip pain increases, do not bend as low. 3. Hold the squat position for __________ seconds. 4. Slowly push with your legs to return to standing. Do not use your hands to pull yourself to standing. Repeat __________ times. Complete this exercise __________ times a day. Hip hike 1.   Stand sideways on a bottom step. Stand on your left / right leg with your other foot unsupported next to the step. You can hold on to the railing or wall for balance if needed. 2. Keep your knees straight and your torso square. Then lift your left / right hip up toward the ceiling. 3. Hold this position for __________ seconds. 4. Slowly let your left / right hip lower toward the floor, past the starting position. Your foot should get closer to the floor. Do not lean or bend your knees. Repeat __________ times. Complete this exercise __________  times a day. Single leg stand 1. Without shoes, stand near a railing or in a doorway. You may hold on to the railing or door frame as needed for balance. 2. Squeeze your left / right buttock muscles, then lift up your other foot. ? Do not let your left / right hip push out to the side. ? It is helpful to stand in front of a mirror for this exercise so you can watch your hip. 3. Hold this position for __________ seconds. Repeat __________ times. Complete this exercise __________ times a day. This information is not intended to replace advice given to you by your health care provider. Make sure you discuss any questions you have with your health care provider. Document Released: 10/04/2004 Document Revised: 12/22/2018 Document Reviewed: 12/22/2018 Elsevier Patient Education  2020 [employer]  

## 2019-04-10 ENCOUNTER — Other Ambulatory Visit: Payer: Self-pay

## 2019-04-10 ENCOUNTER — Ambulatory Visit
Admission: RE | Admit: 2019-04-10 | Discharge: 2019-04-10 | Disposition: A | Discharge status: Home / Self Care | Payer: Federal, State, Local not specified - PPO | Source: Ambulatory Visit | Attending: Radiation Oncology | Admitting: Radiation Oncology

## 2019-04-10 DIAGNOSIS — C50411 Malignant neoplasm of upper-outer quadrant of right female breast: Secondary | ICD-10-CM | POA: Diagnosis not present

## 2019-04-13 ENCOUNTER — Other Ambulatory Visit: Payer: Self-pay

## 2019-04-13 ENCOUNTER — Ambulatory Visit
Admission: RE | Admit: 2019-04-13 | Discharge: 2019-04-13 | Disposition: A | Discharge status: Home / Self Care | Payer: Federal, State, Local not specified - PPO | Source: Ambulatory Visit | Attending: Radiation Oncology | Admitting: Radiation Oncology

## 2019-04-13 DIAGNOSIS — Z170 Estrogen receptor positive status [ER+]: Secondary | ICD-10-CM | POA: Insufficient documentation

## 2019-04-13 DIAGNOSIS — Z510 Encounter for antineoplastic radiation therapy: Secondary | ICD-10-CM | POA: Diagnosis not present

## 2019-04-13 DIAGNOSIS — C50411 Malignant neoplasm of upper-outer quadrant of right female breast: Secondary | ICD-10-CM | POA: Diagnosis not present

## 2019-04-14 ENCOUNTER — Other Ambulatory Visit: Payer: Self-pay

## 2019-04-14 ENCOUNTER — Ambulatory Visit
Admission: RE | Admit: 2019-04-14 | Discharge: 2019-04-14 | Disposition: A | Discharge status: Home / Self Care | Payer: Federal, State, Local not specified - PPO | Source: Ambulatory Visit | Attending: Radiation Oncology | Admitting: Radiation Oncology

## 2019-04-14 DIAGNOSIS — C50411 Malignant neoplasm of upper-outer quadrant of right female breast: Secondary | ICD-10-CM

## 2019-04-14 DIAGNOSIS — Z170 Estrogen receptor positive status [ER+]: Secondary | ICD-10-CM

## 2019-04-14 MED ORDER — ALRA NON-METALLIC DEODORANT (RAD-ONC)
1.0000 "application " | Freq: Once | TOPICAL | Status: AC
  Administered 2019-04-14: 1 via TOPICAL

## 2019-04-14 MED ORDER — RADIAPLEXRX EX GEL
Freq: Two times a day (BID) | CUTANEOUS | Status: DC
  Administered 2019-04-14: 16:00:00 via TOPICAL

## 2019-04-15 ENCOUNTER — Ambulatory Visit
Admission: RE | Admit: 2019-04-15 | Discharge: 2019-04-15 | Disposition: A | Discharge status: Home / Self Care | Payer: Federal, State, Local not specified - PPO | Source: Ambulatory Visit | Attending: Radiation Oncology | Admitting: Radiation Oncology

## 2019-04-15 ENCOUNTER — Other Ambulatory Visit: Payer: Self-pay

## 2019-04-15 DIAGNOSIS — C50411 Malignant neoplasm of upper-outer quadrant of right female breast: Secondary | ICD-10-CM | POA: Diagnosis not present

## 2019-04-16 ENCOUNTER — Encounter: Payer: Self-pay | Admitting: Internal Medicine

## 2019-04-16 ENCOUNTER — Ambulatory Visit: Payer: Federal, State, Local not specified - PPO | Admitting: Internal Medicine

## 2019-04-16 ENCOUNTER — Other Ambulatory Visit: Payer: Self-pay

## 2019-04-16 ENCOUNTER — Ambulatory Visit (INDEPENDENT_AMBULATORY_CARE_PROVIDER_SITE_OTHER): Payer: Federal, State, Local not specified - PPO | Admitting: Internal Medicine

## 2019-04-16 ENCOUNTER — Ambulatory Visit
Admission: RE | Admit: 2019-04-16 | Discharge: 2019-04-16 | Disposition: A | Discharge status: Home / Self Care | Payer: Federal, State, Local not specified - PPO | Source: Ambulatory Visit | Attending: Radiation Oncology | Admitting: Radiation Oncology

## 2019-04-16 DIAGNOSIS — H9311 Tinnitus, right ear: Secondary | ICD-10-CM

## 2019-04-16 DIAGNOSIS — C50411 Malignant neoplasm of upper-outer quadrant of right female breast: Secondary | ICD-10-CM | POA: Diagnosis not present

## 2019-04-16 DIAGNOSIS — H9319 Tinnitus, unspecified ear: Secondary | ICD-10-CM | POA: Insufficient documentation

## 2019-04-16 MED ORDER — CORTISPORIN-TC 3.3-3-10-0.5 MG/ML OT SUSP: 3.0000 [drp] | Freq: Three times a day (TID) | OTIC | 0 refills | Status: DC

## 2019-04-16 NOTE — Patient Instructions (Signed)
We have sent in the ear drops 3 drops 3 times a day for 3 days.

## 2019-04-16 NOTE — Assessment & Plan Note (Signed)
Rx cortisporin to see if this helps as TM slightly bulging. Counseled about likely related to hearing loss. If no improvement she would like to consider ENT.

## 2019-04-16 NOTE — Progress Notes (Signed)
   Subjective:   Patient ID: Summer [name], female    DOB: 1951/03/31, 68 y.o.   MRN: [id number]  HPI The patient is a 68 YO female coming in for concerns about tinnitus in the right ear. Started a couple months ago. Was initially waxing and waning. She then got it more regular and went to urgent care. Was told she had fluid in her ear and that she should see an ENT specialist. She is not having ear pain. Some hearing loss noted. Ringing in the right ear worse in the morning. She does not always have. Denies sinus drainage or pressure. Denies fevers or chills. Is currently undergoing radiation therapy.   Review of Systems  Constitutional: Negative.   HENT: Positive for hearing loss and tinnitus. Negative for congestion, dental problem, drooling, ear pain, facial swelling, postnasal drip, rhinorrhea and sinus pain.   Eyes: Negative.   Respiratory: Negative for cough, chest tightness and shortness of breath.   Cardiovascular: Negative for chest pain, palpitations and leg swelling.  Gastrointestinal: Negative for abdominal distention, abdominal pain, constipation, diarrhea, nausea and vomiting.  Musculoskeletal: Negative.   Skin: Negative.   Neurological: Negative.   Psychiatric/Behavioral: Negative.     Objective:  Physical Exam Constitutional:      Appearance: She is well-developed.  HENT:     Head: Normocephalic and atraumatic.     Ears:     Comments: Slightly bulging TMs bilaterally, clear fluid Neck:     Musculoskeletal: Normal range of motion.  Cardiovascular:     Rate and Rhythm: Normal rate and regular rhythm.  Pulmonary:     Effort: Pulmonary effort is normal. No respiratory distress.     Breath sounds: Normal breath sounds. No wheezing or rales.  Abdominal:     General: Bowel sounds are normal. There is no distension.     Palpations: Abdomen is soft.     Tenderness: There is no abdominal tenderness. There is no rebound.  Skin:    General: Skin is warm and dry.   Neurological:     Mental Status: She is alert and oriented to person, place, and time.     Coordination: Coordination normal.     Vitals:   04/16/19 1305  BP: 122/70  Pulse: 62  Temp: 99 F (37.2 C)  TempSrc: Oral  SpO2: 96%  Weight: 184 lb (83.5 kg)  Height: 5\' 6"  (1.676 m)    Assessment & Plan:

## 2019-04-17 ENCOUNTER — Ambulatory Visit
Admission: RE | Admit: 2019-04-17 | Discharge: 2019-04-17 | Disposition: A | Discharge status: Home / Self Care | Payer: Federal, State, Local not specified - PPO | Source: Ambulatory Visit | Attending: Radiation Oncology | Admitting: Radiation Oncology

## 2019-04-17 DIAGNOSIS — C50411 Malignant neoplasm of upper-outer quadrant of right female breast: Secondary | ICD-10-CM | POA: Diagnosis not present

## 2019-04-20 ENCOUNTER — Other Ambulatory Visit: Payer: Self-pay

## 2019-04-20 ENCOUNTER — Ambulatory Visit
Admission: RE | Admit: 2019-04-20 | Discharge: 2019-04-20 | Disposition: A | Discharge status: Home / Self Care | Payer: Federal, State, Local not specified - PPO | Source: Ambulatory Visit | Attending: Radiation Oncology | Admitting: Radiation Oncology

## 2019-04-20 ENCOUNTER — Telehealth: Payer: Self-pay

## 2019-04-20 ENCOUNTER — Encounter: Payer: Self-pay | Admitting: Hematology

## 2019-04-20 DIAGNOSIS — C50411 Malignant neoplasm of upper-outer quadrant of right female breast: Secondary | ICD-10-CM | POA: Diagnosis not present

## 2019-04-20 NOTE — Progress Notes (Signed)
[facility]   [phone] [phone]   Clinic Follow up Note   Patient Care Team: Hoyt Koch, MD as PCP - General (Internal Medicine) Mauro Kaufmann, RN as Oncology Nurse Navigator Rockwell Germany, RN as Oncology Nurse Navigator Excell Seltzer, MD as Consulting Physician (General Surgery) Truitt Merle, MD as Consulting Physician (Hematology) Gery Pray, MD as Consulting Physician (Radiation Oncology)  Date of Service:  04/23/2019  CHIEF COMPLAINT: F/u of right breast cancer  SUMMARY OF ONCOLOGIC HISTORY: Oncology History Overview Note  Cancer Staging Malignant neoplasm of upper-outer quadrant of right breast in female, estrogen receptor positive ([place]) Staging form: Breast, AJCC 8th Edition - Clinical stage from 11/10/2018: Stage IA (cT1b, cN0, cM0, G2, ER+, PR+, HER2+) - Signed by Truitt Merle, MD on 11/18/2018     Malignant neoplasm of upper-outer quadrant of right breast in female, estrogen receptor positive ([place])  11/07/2018 Mammogram   Diagnostic 11/07/18 IMPRESSION: 1. 1 x 0.7 x 0.6 cm hypoechoic mass with distortion at the 9-9:30 position of the RIGHT breast 6 cm from the nipple with distortion in the UPPER-OUTER RIGHT breast, corresponding to the mammographic abnormality. One RIGHT axillary lymph node with borderline cortical thickness. Tissue sampling of the RIGHT breast mass and borderline RIGHT axillary lymph node recommended. ADDENDUM: Patient returned today for biopsy of a single borderline thickened lymph node in the RIGHT axilla. I could not reproduce a morphologically abnormal lymph node today, possibly resolved in the interval. Today, only normal appearing lymph nodes were identified in the RIGHT axilla. As such, the axillary ultrasound-guided biopsy was canceled.   11/10/2018 Cancer Staging   Staging form: Breast, AJCC 8th Edition - Clinical stage from 11/10/2018: Stage IA (cT1b, cN0, cM0, G2, ER+, PR+, HER2+) -  Signed by Truitt Merle, MD on 11/18/2018   11/11/2018 Initial Biopsy   Diagnosis 11/11/18 Breast, right, needle core biopsy, upper outer - 9:30 o'clock position - INVASIVE DUCTAL CARCINOMA, GRADE II/III. - SEE MICROSCOPIC DESCRIPTION.   11/11/2018 Receptors her2   Results: IMMUNOHISTOCHEMICAL AND MORPHOMETRIC ANALYSIS PERFORMED MANUALLY The tumor cells are POSITIVE for Her2 (3+). Estrogen Receptor: 95%, POSITIVE, STRONG STAINING INTENSITY Progesterone Receptor: 5%, POSITIVE, STRONG STAINING INTENSITY Proliferation Marker Ki67: 20%   11/17/2018 Initial Diagnosis   Malignant neoplasm of upper-outer quadrant of right breast in female, estrogen receptor positive ([place])   11/28/2018 Surgery   RIGHT BREAST LUMPECTOMY WITH RADIOACTIVE SEED AND RIGHT AXILLARY SENTINEL LYMPH NODE BIOPSY by Dr. Excell Seltzer  11/28/18    11/28/2018 Pathology Results   Diagnosis 11/28/18  1. Breast, lumpectomy, Right w/seed - INVASIVE DUCTAL CARCINOMA, 1.6 CM, NOTTINGHAM GRADE 2 OF 3. - MARGINS OF RESECTION ARE NOT INVOLVED (CLOSEST MARGIN: LESS THAN 1 MM, ANTERIOR). - DUCTAL CARCINOMA IN SITU. - BIOPSY SITE CHANGES. - SEE ONCOLOGY TABLE. 2. Lymph node, sentinel, biopsy, right Axillary - ONE LYMPH NODE, NEGATIVE FOR CARCINOMA (0/1). 3. Lymph node, sentinel, biopsy, right Axillary - ONE LYMPH NODE, NEGATIVE FOR CARCINOMA (0/1).   11/28/2018 Cancer Staging   Staging form: Breast, AJCC 8th Edition - Pathologic stage from 11/28/2018: Stage IA (pT1c, pN0, cM0, G2, ER+, PR+, HER2+) - Signed by Truitt Merle, MD on 12/11/2018   12/22/2018 Procedure   Baseline ECHO 12/22/18  IMPRESSIONS  1. The left ventricle has normal systolic function with an ejection fraction of 60-65%. The cavity size was normal. There is mild concentric left ventricular hypertrophy. Left ventricular diastolic parameters were normal.  2. The right ventricle has normal systolic function. The cavity  was normal. There is no increase in right ventricular wall thickness.   3. Left atrial size was moderately dilated.  4. The aortic valve is tricuspid. Aortic valve regurgitation was not assessed by color flow Doppler.    01/02/2019 - 02/13/2019 Chemotherapy   adjuvant chemo weekly Taxol with Herceptin q3weeks starting 01/02/19. Will d/c after 02/13/19 due to worsening neuropathy.        Radiation Therapy   RT with Dr. Sondra Come starting 04/09/19   02/20/2019 -  Chemotherapy   Kadcyla q3weeks starting 02/20/19      CURRENT THERAPY:  -Adjuvant chemo weekly Taxol with Herceptin starting 01/02/19. Will d/c after 02/13/19 and will switch to Kadcyla q3weeks starting 02/20/19. Due to worsening Muscle cramps will switch Kadcyla to Herceptin on 04/23/19  -IV iron as needed -RT started 04/09/19  INTERVAL HISTORY:  Summer [name] is here for a follow up and treatment. She presents to the clinic alone. She notes having worsening muscle cramps now all over her body. This has effected her sleep. This has been constant. She has tried warm and cold packs. She has tried potassium and Magnesium. She notes she has take Robaxin, Tylenol and ibuprofen without relief. She does not mind restarting Herceptin. She would like her infusions to move to Fridays.    REVIEW OF SYSTEMS:   Constitutional: Denies fevers, chills or abnormal weight loss Eyes: Denies blurriness of vision Ears, nose, mouth, throat, and face: Denies mucositis or sore throat Respiratory: Denies cough, dyspnea or wheezes Cardiovascular: Denies palpitation, chest discomfort or lower extremity swelling Gastrointestinal:  Denies nausea, heartburn or change in bowel habits Skin: Denies abnormal skin rashes MSK: (+) Worsening muscle cramps, especially in legs, feet Lymphatics: Denies new lymphadenopathy or easy bruising Neurological:Denies numbness, tingling or new weaknesses Behavioral/Psych: Mood is stable, no new changes  All other systems were reviewed with the patient and are negative.  MEDICAL HISTORY:   Past Medical History:  Diagnosis Date  . Abdominal pain, left lower quadrant 11/09/2009   Qualifier: Diagnosis of  By: Valma Cava LPN, Izora Gala    . Allergic rhinitis 08/26/2008   Qualifier: Diagnosis of  By: Sherren Mocha MD, Jory Ee   . Anemia, iron deficiency 11/21/2018  . Arthritis shoulders and back  . BACK PAIN, CHRONIC 06/13/2007   Qualifier: Diagnosis of  By: Sherren Mocha MD, Jory Ee   . 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 Constipation 07/01/2018  . Costochondritis 05/04/2014  . Diverticulitis of colon 06/13/2007   S/p segmental colectomy for perforated diverticulitis   . Diverticulosis   . Facial pain 10/06/2015  . Fibromyalgia   . Foot sprain, left, initial encounter 08/31/2016  . Glossitis 02/02/2016  . Headache 04/24/2016  . HEMATURIA UNSPECIFIED 06/25/2008   Qualifier: Diagnosis of  By: Sherren Mocha MD, Jory Ee   . HEMATURIA, HX OF 11/14/2009   Qualifier: Diagnosis of  By: Regino Schultze CMA (AAMA), Apolonio Schneiders    . History of kidney stones   . History of kidney stones   . History of shingles 2012--  no residual pain  . Itching 10/13/2018  . Left eye pain 07/01/2018  . Left ureteral calculus   . LLQ pain 04/24/2016  . LOC OSTEOARTHROS NOT SPEC PRIM/SEC OTH [facility] SITE 06/27/2009   Qualifier: Diagnosis of  By: Sherren Mocha MD, Jory Ee   . Malignant neoplasm of upper-outer quadrant of right breast in female, estrogen receptor positive ([place]) 11/17/2018  . MCI (mild cognitive impairment)   .  Memory loss 12/21/2016  . Morbid obesity ([place]) 11/09/2010  . Pain of right side of body 08/31/2016  . Pruritus 10/26/2016  . Right sided temporal headache 12/21/2016  . Tremor 12/21/2016  . Trigeminal neuralgia RIGHT  . Trigeminal neuralgia of right side of face 11/30/2010  . Trochanteric bursitis, right hip 10/26/2016  . Urgency of urination   . Weight loss 07/22/2017  . Zoster without complications 1/61/0960    SURGICAL HISTORY: Past Surgical History:  Procedure Laterality Date  .  BREAST LUMPECTOMY WITH RADIOACTIVE SEED AND SENTINEL LYMPH NODE BIOPSY Right 11/28/2018   Procedure: RIGHT BREAST LUMPECTOMY WITH RADIOACTIVE SEED AND RIGHT AXILLARY SENTINEL LYMPH NODE BIOPSY;  Surgeon: Excell Seltzer, MD;  Location: [facility];  Service: General;  Laterality: Right;  . CEREBRAL MICROVASCULAR DECOMPRESSION  07-05-2006   RIGHT TRIGEMINAL NERVE  . CYSTOSCOPY/RETROGRADE/URETEROSCOPY/STONE EXTRACTION WITH BASKET  07/04/2012   Procedure: CYSTOSCOPY/RETROGRADE/URETEROSCOPY/STONE EXTRACTION WITH BASKET;  Surgeon: Claybon Jabs, MD;  Location: [facility];  Service: Urology;  Laterality: Left;  . EXPLORATORY LAPAROTOMY/ RESECTION MID TO DISTAL SIGMOID AND PROXIMAL RECTUM/ END PROXIMAL SIGMOID COLOSTOMY  07-17-2006   PERFORATED DIVERTICULITIS WITH PERITONITIS  . gamma knife  05/17/2016   for trigeminal neuralgia, [facility], Dr Salomon Fick  . gamma knife  2019  . PORTACATH PLACEMENT Left 11/28/2018   Procedure: INSERTION PORT-A-CATH WITH ULTRASOUND;  Surgeon: Excell Seltzer, MD;  Location: [facility];  Service: General;  Laterality: Left;  . RESECTION COLOSTOMY/ CLOSURE COLOSTOMY WITH COLOPROCTOSTOMY  02-20-2007  . TONSILLECTOMY  AS CHILD  . URETEROSCOPY  07/04/2012   Procedure: URETEROSCOPY;  Surgeon: Claybon Jabs, MD;  Location: [facility];  Service: Urology;  Laterality: Left;  Marland Kitchen VAGINAL HYSTERECTOMY  1998   Partial  . WISDOM TOOTH EXTRACTION      I have reviewed the social history and family history with the patient and they are unchanged from previous note.  ALLERGIES:  is allergic to clindamycin hcl.  MEDICATIONS:  Current Outpatient Medications  Medication Sig Dispense Refill  . acetaminophen (TYLENOL) 500 MG tablet Take 1,000 mg by mouth every 6 (six) hours as needed (pain).     . famotidine (PEPCID) 40 MG tablet Take 1 tablet (40 mg total) by mouth daily. 30 tablet 3  . gabapentin (NEURONTIN) 600 MG tablet TAKE 2 TABLETS TID. MAX OF SIX TABS  PER DAY. (Patient taking differently: Take 1,200 mg by mouth 3 (three) times daily. TAKE 2 TABLETS TID. MAX OF SIX TABS PER DAY.) 540 tablet 3  . lidocaine-prilocaine (EMLA) cream Apply to affected area once 30 g 3  . methocarbamol (ROBAXIN) 500 MG tablet Take 1 tablet (500 mg total) by mouth at bedtime as needed for muscle spasms. 30 tablet 1  . metoprolol tartrate (LOPRESSOR) 100 MG tablet Take 1 tablet, 100 mg, 2 hours before your cardiac CT. 1 tablet 0  . neomycin-colistin-hydrocortisone-thonzonium (CORTISPORIN-TC) 3.11-10-08-0.5 MG/ML OTIC suspension Place 3 drops into both ears 3 (three) times daily. 10 mL 0  . omeprazole (PRILOSEC) 40 MG capsule Take 1 capsule (40 mg total) by mouth daily. 30 capsule 5  . oxyCODONE (OXY IR/ROXICODONE) 5 MG immediate release tablet Take 1 tablet (5 mg total) by mouth every 6 (six) hours as needed for moderate pain, severe pain or breakthrough pain. 10 tablet 0  . triamcinolone ointment (KENALOG) 0.1 % Apply 1 application topically 3 (three) times daily. 90 g 0  . traMADol (ULTRAM) 50 MG tablet Take 1 tablet (50 mg total) by mouth  every 6 (six) hours as needed. 30 tablet 0   No current facility-administered medications for this visit.    Facility-Administered Medications Ordered in Other Visits  Medication Dose Route Frequency Provider Last Rate Last Dose  . heparin lock flush 100 unit/mL  500 Units Intracatheter Once PRN Truitt Merle, MD      . sodium chloride flush (NS) 0.9 % injection 10 mL  10 mL Intracatheter PRN Truitt Merle, MD      . trastuzumab-dkst (OGIVRI) 504 mg in sodium chloride 0.9 % 250 mL chemo infusion  6 mg/kg (Treatment Plan Recorded) Intravenous Once Truitt Merle, MD 548 mL/hr at 04/23/19 1232 504 mg at 04/23/19 1232    PHYSICAL EXAMINATION: ECOG PERFORMANCE STATUS: 1 - Symptomatic but completely ambulatory  Vitals:   04/23/19 1008  BP: (!) 143/70  Pulse: 65  Resp: 18  Temp: 98.2 F (36.8 C)  SpO2: 100%   Filed Weights   04/23/19 1008   Weight: 184 lb 1.6 oz (83.5 kg)    GENERAL:alert, no distress and comfortable SKIN: skin color, texture, turgor are normal, no rashes or significant lesions EYES: normal, Conjunctiva are pink and non-injected, sclera clear  NECK: supple, thyroid normal size, non-tender, without nodularity LYMPH:  no palpable lymphadenopathy in the cervical, axillary  LUNGS: clear to auscultation and percussion with normal breathing effort HEART: regular rate & rhythm and no murmurs and no lower extremity edema ABDOMEN:abdomen soft, non-tender and normal bowel sounds Musculoskeletal:no cyanosis of digits and no clubbing  NEURO: alert & oriented x 3 with fluent speech, no focal motor/sensory deficits  LABORATORY DATA:  I have reviewed the data as listed CBC Latest Ref Rng & Units 04/23/2019 04/03/2019 03/12/2019  WBC 4.0 - 10.5 K/uL 6.3 4.9 6.0  Hemoglobin 12.0 - 15.0 g/dL 12.9 13.0 12.5  Hematocrit 36.0 - 46.0 % 40.7 41.2 39.9  Platelets 150 - 400 K/uL 126(L) 164 235     CMP Latest Ref Rng & Units 04/23/2019 04/03/2019 03/12/2019  Glucose 70 - 99 mg/dL 92 130(H) 134(H)  BUN 8 - 23 mg/dL 12 7(L) 9  Creatinine 0.44 - 1.00 mg/dL 0.74 0.76 0.72  Sodium 135 - 145 mmol/L 140 142 142  Potassium 3.5 - 5.1 mmol/L 3.8 3.5 3.4(L)  Chloride 98 - 111 mmol/L 102 106 105  CO2 22 - 32 mmol/L 29 29 28   Calcium 8.9 - 10.3 mg/dL 9.3 9.0 8.7(L)  Total Protein 6.5 - 8.1 g/dL 6.8 6.6 6.7  Total Bilirubin 0.3 - 1.2 mg/dL 0.8 0.7 0.4  Alkaline Phos 38 - 126 U/L 93 104 97  AST 15 - 41 U/L 30 31 28   ALT 0 - 44 U/L 23 19 23       RADIOGRAPHIC STUDIES: I have personally reviewed the radiological images as listed and agreed with the findings in the report. No results found.   ASSESSMENT & PLAN:  Summer [name] is a 68 y.o. female with   1.Malignant neoplasm of upper-outer quadrant of right breast, StageIA,pT1b,N0,M0, ER/PR+, HER2+, GradeII -She was recently diagnosed in 11/2018. She is s/p right  breastlumpectomyand SLNB as of 11/28/18.  -Herpathology results showed her small grade II tumor was completely resected with negative margins and node negative. -She startedadjuvantweekly Taxol with Herceptinon4/24/20butstopped after 6 weeks therapydue to worsening neuropathyand infusion reaction. -Adjuvant chemo changed toTDM-1(Kadcyla)q3weeksstarting6/12/20. If she tolerates we will continueanti-her2 treatmentuntil 12/2019. I will likelyswitchto maintenanceHerceptinlater in her treatment orsooner if she has poor toleranceof Kadcyla.  -She started RT with Dr. Sondra Come on 04/09/19. She  is tolerating RT well so far.  -Given her significant worsening muscle cramps, will hold Kadcyla and restart her Herceptin for now. If her cramps much improve or resolve may restart Kadcyla at a lower dose.  -Labs reviewed, CBC and CMP WNL except PLT 126K. Iron panel still pending. Overall adequate to proceed withHerceptin today.  -F/u in 3 weeks  2. Significant Muscle Cramps and body pain  -Worsened lately, now diffuse across body, especially in her legs and feet.  -I advised her to increase her water intake.  -I also recommend OTC Magnesium, potassium supplements and increase calcium in diet.  -She has not felt relief with Robaxin, OTC Tylenol and Ibuprofen. I will call in Tramadol 62m q6-8hours as needed. I discussed this can cause drowsiness and constipation, she will watch for this.  -I discussed Kadcyla is the only new medication she is on, it is likely her pain is related to it. I will stop it and start Herceptin for now.  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 Anemia,iron deficiency -Pt notes she has been anemic in the past year -Last colonoscopy was in 04/2012 which wasunremarkable except diverticulosis.  -She had partial colectomy in the past due to severe diverticulosis. -This is possiblyiron  deficiency from diverticulosis.She denies any current bleeding.  -After COVID-19 improvesand her chemo treatmentI will refer herbacktoDr. Gessnerfor colonoscopy -Her workup showed iron 17, ferritin 4, normal retic ct, folate Vitamin B12 and SPEP panels. -IV iron was given 3/18/20and 12/12/18.  -will monitor her iron level every month -Hgnormalizedrecently, iron panel still pending today (04/23/19)  4.Trigeminal neuralgia  -onGabapentin 2096mTID -She has been having moderate right side Has which I suspect is exacerbation of her neuralgia secondary to chemo. Will monitor.  -She has oxycodone which she can use for significant pain. -5/26/20brain and neck MRIwere stable.  5.PeripheralNeuropathy, G1-2 -Secondary to chemoTaxolstarted aftercycle 3. -I discussed neuropathy can progress with more treatment. Will monitor  -PA Vannpreviouslystarted her on Robaxin. She can take Tylenol in between for breakthrough pain.Continue -Taxol switched toTDM-1 Kadcylaon 02/20/19.   -Her neuropathynow stable with numbness in her left toes and tingling in her fingertips. -Continue B12 complex. I also encouraged her to exercise.      PLAN: -hold Kadcyla for now due to her worsening cramps  -Labs reviewed and adequate to proceed withHerceptin today  -Continue RT -Lab, flush, f/u and Herceptin in 3 weeks -called in tramadol for her today    No problem-specific Assessment & Plan notes found for this encounter.   No orders of the defined types were placed in this encounter.  All questions were answered. The patient knows to call the clinic with any problems, questions or concerns. No barriers to learning was detected. I spent 20 minutes counseling the patient face to face. The total time spent in the appointment was 25 minutes and more than 50% was on counseling and review of test results     YaTruitt MerleMD 04/23/2019   I, AmJoslyn Devonam acting as scribe for YaTruitt MerleMD.   I have reviewed the above documentation for accuracy and completeness, and I agree with the above.

## 2019-04-20 NOTE — Telephone Encounter (Signed)
Patient calls stating she is having terrible leg cramps at night, has not slept since Friday night.  Some of her toes feel numb.  None of the medications she is currently taking is help.  She is requesting Dr. Burr Medico prescribe something to help with this.  [phone]

## 2019-04-21 ENCOUNTER — Other Ambulatory Visit: Payer: Self-pay

## 2019-04-21 ENCOUNTER — Ambulatory Visit
Admission: RE | Admit: 2019-04-21 | Discharge: 2019-04-21 | Disposition: A | Discharge status: Home / Self Care | Payer: Federal, State, Local not specified - PPO | Source: Ambulatory Visit | Attending: Radiation Oncology | Admitting: Radiation Oncology

## 2019-04-21 ENCOUNTER — Encounter: Payer: Self-pay | Admitting: Rheumatology

## 2019-04-21 DIAGNOSIS — C50411 Malignant neoplasm of upper-outer quadrant of right female breast: Secondary | ICD-10-CM | POA: Diagnosis not present

## 2019-04-21 NOTE — Telephone Encounter (Signed)
I spoke with Dr. Estanislado Pandy and she does not think it is related to her osteoarthritis.  Please advise patient to reach out to Dr. Burr Medico.

## 2019-04-22 ENCOUNTER — Other Ambulatory Visit: Payer: Self-pay

## 2019-04-22 ENCOUNTER — Ambulatory Visit
Admission: RE | Admit: 2019-04-22 | Discharge: 2019-04-22 | Disposition: A | Discharge status: Home / Self Care | Payer: Federal, State, Local not specified - PPO | Source: Ambulatory Visit | Attending: Radiation Oncology | Admitting: Radiation Oncology

## 2019-04-22 ENCOUNTER — Telehealth: Payer: Self-pay

## 2019-04-22 DIAGNOSIS — C50411 Malignant neoplasm of upper-outer quadrant of right female breast: Secondary | ICD-10-CM | POA: Diagnosis not present

## 2019-04-22 NOTE — Telephone Encounter (Signed)
Patient call with complaint of having leg cramps and cramps in feet so bad unable to sleep. Also having cramping in her hands.  She has called a couple of times and is requesting something be called in today for this.   ______________________________ Message given to Dr. Burr Medico.

## 2019-04-23 ENCOUNTER — Ambulatory Visit
Admission: RE | Admit: 2019-04-23 | Discharge: 2019-04-23 | Disposition: A | Discharge status: Home / Self Care | Payer: Federal, State, Local not specified - PPO | Source: Ambulatory Visit | Attending: Radiation Oncology | Admitting: Radiation Oncology

## 2019-04-23 ENCOUNTER — Inpatient Hospital Stay: Payer: Federal, State, Local not specified - PPO | Admitting: Hematology

## 2019-04-23 ENCOUNTER — Inpatient Hospital Stay: Payer: Federal, State, Local not specified - PPO

## 2019-04-23 ENCOUNTER — Other Ambulatory Visit: Payer: Self-pay

## 2019-04-23 ENCOUNTER — Inpatient Hospital Stay: Payer: Federal, State, Local not specified - PPO | Attending: Hematology

## 2019-04-23 ENCOUNTER — Telehealth: Payer: Self-pay | Admitting: Hematology

## 2019-04-23 ENCOUNTER — Encounter: Payer: Self-pay | Admitting: Hematology

## 2019-04-23 VITALS — BP 143/70 | HR 65 | Temp 98.2°F | Resp 18 | Ht 66.0 in | Wt 184.1 lb

## 2019-04-23 DIAGNOSIS — Z923 Personal history of irradiation: Secondary | ICD-10-CM | POA: Insufficient documentation

## 2019-04-23 DIAGNOSIS — Z9221 Personal history of antineoplastic chemotherapy: Secondary | ICD-10-CM | POA: Insufficient documentation

## 2019-04-23 DIAGNOSIS — I351 Nonrheumatic aortic (valve) insufficiency: Secondary | ICD-10-CM | POA: Insufficient documentation

## 2019-04-23 DIAGNOSIS — Z170 Estrogen receptor positive status [ER+]: Secondary | ICD-10-CM

## 2019-04-23 DIAGNOSIS — T451X5A Adverse effect of antineoplastic and immunosuppressive drugs, initial encounter: Secondary | ICD-10-CM | POA: Diagnosis not present

## 2019-04-23 DIAGNOSIS — K5900 Constipation, unspecified: Secondary | ICD-10-CM | POA: Insufficient documentation

## 2019-04-23 DIAGNOSIS — Z95828 Presence of other vascular implants and grafts: Secondary | ICD-10-CM

## 2019-04-23 DIAGNOSIS — C50411 Malignant neoplasm of upper-outer quadrant of right female breast: Secondary | ICD-10-CM

## 2019-04-23 DIAGNOSIS — R252 Cramp and spasm: Secondary | ICD-10-CM | POA: Diagnosis not present

## 2019-04-23 DIAGNOSIS — Z79899 Other long term (current) drug therapy: Secondary | ICD-10-CM | POA: Insufficient documentation

## 2019-04-23 DIAGNOSIS — G620 Drug-induced polyneuropathy: Secondary | ICD-10-CM | POA: Insufficient documentation

## 2019-04-23 DIAGNOSIS — D509 Iron deficiency anemia, unspecified: Secondary | ICD-10-CM | POA: Insufficient documentation

## 2019-04-23 DIAGNOSIS — G500 Trigeminal neuralgia: Secondary | ICD-10-CM | POA: Insufficient documentation

## 2019-04-23 DIAGNOSIS — Z5112 Encounter for antineoplastic immunotherapy: Secondary | ICD-10-CM | POA: Diagnosis not present

## 2019-04-23 LAB — CBC WITH DIFFERENTIAL/PLATELET
Abs Immature Granulocytes: 0.02 10*3/uL (ref 0.00–0.07)
Basophils Absolute: 0 10*3/uL (ref 0.0–0.1)
Basophils Relative: 1 %
Eosinophils Absolute: 0.1 10*3/uL (ref 0.0–0.5)
Eosinophils Relative: 2 %
HCT: 40.7 % (ref 36.0–46.0)
Hemoglobin: 12.9 g/dL (ref 12.0–15.0)
Immature Granulocytes: 0 %
Lymphocytes Relative: 25 %
Lymphs Abs: 1.6 10*3/uL (ref 0.7–4.0)
MCH: 29.1 pg (ref 26.0–34.0)
MCHC: 31.7 g/dL (ref 30.0–36.0)
MCV: 91.7 fL (ref 80.0–100.0)
Monocytes Absolute: 0.7 10*3/uL (ref 0.1–1.0)
Monocytes Relative: 11 %
Neutro Abs: 3.9 10*3/uL (ref 1.7–7.7)
Neutrophils Relative %: 61 %
Platelets: 126 10*3/uL — ABNORMAL LOW (ref 150–400)
RBC: 4.44 MIL/uL (ref 3.87–5.11)
RDW: 14.6 % (ref 11.5–15.5)
WBC: 6.3 10*3/uL (ref 4.0–10.5)
nRBC: 0 % (ref 0.0–0.2)

## 2019-04-23 LAB — COMPREHENSIVE METABOLIC PANEL
ALT: 23 U/L (ref 0–44)
AST: 30 U/L (ref 15–41)
Albumin: 3.3 g/dL — ABNORMAL LOW (ref 3.5–5.0)
Alkaline Phosphatase: 93 U/L (ref 38–126)
Anion gap: 9 (ref 5–15)
BUN: 12 mg/dL (ref 8–23)
CO2: 29 mmol/L (ref 22–32)
Calcium: 9.3 mg/dL (ref 8.9–10.3)
Chloride: 102 mmol/L (ref 98–111)
Creatinine, Ser: 0.74 mg/dL (ref 0.44–1.00)
GFR calc Af Amer: >60 mL/min (ref >60)
GFR calc non Af Amer: >60 mL/min (ref >60)
Glucose, Bld: 92 mg/dL (ref 70–99)
Potassium: 3.8 mmol/L (ref 3.5–5.1)
Sodium: 140 mmol/L (ref 135–145)
Total Bilirubin: 0.8 mg/dL (ref 0.3–1.2)
Total Protein: 6.8 g/dL (ref 6.5–8.1)

## 2019-04-23 LAB — IRON AND TIBC
Iron: 64 ug/dL (ref 41–142)
Saturation Ratios: 17 % — ABNORMAL LOW (ref 21–57)
TIBC: 378 ug/dL (ref 236–444)
UIBC: 314 ug/dL (ref 120–384)

## 2019-04-23 LAB — FERRITIN: Ferritin: 42 ng/mL (ref 11–307)

## 2019-04-23 MED ORDER — HEPARIN SOD (PORK) LOCK FLUSH 100 UNIT/ML IV SOLN
500.0000 [IU] | Freq: Once | INTRAVENOUS | Status: AC | PRN
  Administered 2019-04-23: 13:00:00 500 [IU]
  Filled 2019-04-23: qty 5

## 2019-04-23 MED ORDER — SODIUM CHLORIDE 0.9% FLUSH
10.0000 mL | INTRAVENOUS | Status: DC | PRN
  Administered 2019-04-23: 10 mL
  Filled 2019-04-23: qty 10

## 2019-04-23 MED ORDER — DIPHENHYDRAMINE HCL 25 MG PO CAPS
ORAL_CAPSULE | ORAL | Status: AC
  Filled 2019-04-23: qty 2

## 2019-04-23 MED ORDER — TRASTUZUMAB-DKST CHEMO 150 MG IV SOLR
6.0000 mg/kg | Freq: Once | INTRAVENOUS | Status: AC
  Administered 2019-04-23: 504 mg via INTRAVENOUS
  Filled 2019-04-23: qty 24

## 2019-04-23 MED ORDER — TRAMADOL HCL 50 MG PO TABS: 50.0000 mg | ORAL_TABLET | Freq: Four times a day (QID) | ORAL | 0 refills | Status: DC | PRN

## 2019-04-23 MED ORDER — ACETAMINOPHEN 325 MG PO TABS
650.0000 mg | ORAL_TABLET | Freq: Once | ORAL | Status: AC
  Administered 2019-04-23: 650 mg via ORAL

## 2019-04-23 MED ORDER — ACETAMINOPHEN 325 MG PO TABS
ORAL_TABLET | ORAL | Status: AC
  Filled 2019-04-23: qty 2

## 2019-04-23 MED ORDER — DIPHENHYDRAMINE HCL 25 MG PO CAPS
50.0000 mg | ORAL_CAPSULE | Freq: Once | ORAL | Status: AC
  Administered 2019-04-23: 50 mg via ORAL

## 2019-04-23 MED ORDER — SODIUM CHLORIDE 0.9% FLUSH
10.0000 mL | Freq: Once | INTRAVENOUS | Status: AC
  Administered 2019-04-23: 10 mL
  Filled 2019-04-23: qty 10

## 2019-04-23 MED ORDER — SODIUM CHLORIDE 0.9 % IV SOLN
Freq: Once | INTRAVENOUS | Status: AC
  Administered 2019-04-23: 12:00:00 via INTRAVENOUS
  Filled 2019-04-23: qty 250

## 2019-04-23 NOTE — Progress Notes (Signed)
MD switching to Ogivri (trastuzumab-dkst) at least for 04/23/19.  Pt c/o "cramping" on Kadcyla.  MD will hold Kadcyla at least for 04/23/19 but possibly may restart Kadcyla at some point.   The following biosimilar Ogivri (trastuzumab-dkst) has been selected for use in this patient.  No PA required.  Kennith Center, Pharm.D., CPP 04/23/2019@10 :56 AM

## 2019-04-23 NOTE — Telephone Encounter (Signed)
Scheduled appt per 8/13 los.  Infusion will inform patient of the new appt and a print out.

## 2019-04-23 NOTE — Patient Instructions (Signed)
[facility] Discharge Instructions for Patients Receiving Chemotherapy  Today you received the following chemotherapy agents: Trastuzumab-dkst Madalyn Rob)  To help prevent nausea and vomiting after your treatment, we encourage you to take your nausea medication as directed.   If you develop nausea and vomiting that is not controlled by your nausea medication, call the clinic.   BELOW ARE SYMPTOMS THAT SHOULD BE REPORTED IMMEDIATELY:  *FEVER GREATER THAN 100.5 F  *CHILLS WITH OR WITHOUT FEVER  NAUSEA AND VOMITING THAT IS NOT CONTROLLED WITH YOUR NAUSEA MEDICATION  *UNUSUAL SHORTNESS OF BREATH  *UNUSUAL BRUISING OR BLEEDING  TENDERNESS IN MOUTH AND THROAT WITH OR WITHOUT PRESENCE OF ULCERS  *URINARY PROBLEMS  *BOWEL PROBLEMS  UNUSUAL RASH Items with * indicate a potential emergency and should be followed up as soon as possible.  Feel free to call the clinic should you have any questions or concerns. The clinic phone number is [phone].  Please show the [place] [facility] at check-in to the Emergency Department and triage nurse.  Coronavirus (COVID-19) Are you at risk?  Are you at risk for the Coronavirus (COVID-19)?  To be considered HIGH RISK for Coronavirus (COVID-19), you have to meet the following criteria:  . Traveled to [place], [place], [place], [place] or [place]; or in the [place] to [place], [place], [place], or [place]; and have fever, cough, and shortness of breath within the last 2 weeks of travel OR . Been in close contact with a person diagnosed with COVID-19 within the last 2 weeks and have fever, cough, and shortness of breath . IF YOU DO NOT MEET THESE CRITERIA, YOU ARE CONSIDERED LOW RISK FOR COVID-19.  What to do if you are HIGH RISK for COVID-19?  Marland Kitchen If you are having a medical emergency, call 911. . Seek medical care right away. Before you go to a doctor's office, urgent care or emergency department, call ahead and tell them  about your recent travel, contact with someone diagnosed with COVID-19, and your symptoms. You should receive instructions from your physician's office regarding next steps of care.  . When you arrive at healthcare provider, tell the healthcare staff immediately you have returned from visiting [place], [place], [place], [place] or [place]; or traveled in the [place] to [place], [place], [place], or [place]; in the last two weeks or you have been in close contact with a person diagnosed with COVID-19 in the last 2 weeks.   . Tell the health care staff about your symptoms: fever, cough and shortness of breath. . After you have been seen by a medical provider, you will be either: o Tested for (COVID-19) and discharged home on quarantine except to seek medical care if symptoms worsen, and asked to  - Stay home and avoid contact with others until you get your results (4-5 days)  - Avoid travel on public transportation if possible (such as bus, train, or airplane) or o Sent to the Emergency Department by EMS for evaluation, COVID-19 testing, and possible admission depending on your condition and test results.  What to do if you are LOW RISK for COVID-19?  Reduce your risk of any infection by using the same precautions used for avoiding the common cold or flu:  Marland Kitchen Wash your hands often with soap and warm water for at least 20 seconds.  If soap and water are not readily available, use an alcohol-based hand sanitizer with at least 60% alcohol.  . If coughing or  sneezing, cover your mouth and nose by coughing or sneezing into the elbow areas of your shirt or coat, into a tissue or into your sleeve (not your hands). . Avoid shaking hands with others and consider head nods or verbal greetings only. . Avoid touching your eyes, nose, or mouth with unwashed hands.  . Avoid close contact with people who are sick. . Avoid places or events with large numbers of people in one location, like concerts or  sporting events. . Carefully consider travel plans you have or are making. . If you are planning any travel outside or inside the [place], visit the CDC's Travelers' Health webpage for the latest health notices. . If you have some symptoms but not all symptoms, continue to monitor at home and seek medical attention if your symptoms worsen. . If you are having a medical emergency, call 911.   [place] / e-Visit: [email or web address]         [facility]: [facility]: [phone]                   [facility]: [phone]

## 2019-04-24 ENCOUNTER — Ambulatory Visit
Admission: RE | Admit: 2019-04-24 | Discharge: 2019-04-24 | Disposition: A | Discharge status: Home / Self Care | Payer: Federal, State, Local not specified - PPO | Source: Ambulatory Visit | Attending: Radiation Oncology | Admitting: Radiation Oncology

## 2019-04-24 ENCOUNTER — Encounter: Payer: Self-pay | Admitting: Hematology

## 2019-04-24 ENCOUNTER — Other Ambulatory Visit: Payer: Self-pay

## 2019-04-24 DIAGNOSIS — C50411 Malignant neoplasm of upper-outer quadrant of right female breast: Secondary | ICD-10-CM | POA: Diagnosis not present

## 2019-04-27 ENCOUNTER — Other Ambulatory Visit: Payer: Self-pay

## 2019-04-27 ENCOUNTER — Ambulatory Visit
Admission: RE | Admit: 2019-04-27 | Discharge: 2019-04-27 | Disposition: A | Discharge status: Home / Self Care | Payer: Federal, State, Local not specified - PPO | Source: Ambulatory Visit | Attending: Radiation Oncology | Admitting: Radiation Oncology

## 2019-04-27 DIAGNOSIS — C50411 Malignant neoplasm of upper-outer quadrant of right female breast: Secondary | ICD-10-CM | POA: Diagnosis not present

## 2019-04-28 ENCOUNTER — Ambulatory Visit
Admission: RE | Admit: 2019-04-28 | Discharge: 2019-04-28 | Disposition: A | Discharge status: Home / Self Care | Payer: Federal, State, Local not specified - PPO | Source: Ambulatory Visit | Attending: Radiation Oncology | Admitting: Radiation Oncology

## 2019-04-28 ENCOUNTER — Other Ambulatory Visit: Payer: Self-pay

## 2019-04-28 DIAGNOSIS — C50411 Malignant neoplasm of upper-outer quadrant of right female breast: Secondary | ICD-10-CM | POA: Diagnosis not present

## 2019-04-29 ENCOUNTER — Ambulatory Visit
Admission: RE | Admit: 2019-04-29 | Discharge: 2019-04-29 | Disposition: A | Discharge status: Home / Self Care | Payer: Federal, State, Local not specified - PPO | Source: Ambulatory Visit | Attending: Radiation Oncology | Admitting: Radiation Oncology

## 2019-04-29 ENCOUNTER — Other Ambulatory Visit: Payer: Self-pay

## 2019-04-29 DIAGNOSIS — C50411 Malignant neoplasm of upper-outer quadrant of right female breast: Secondary | ICD-10-CM | POA: Diagnosis not present

## 2019-04-30 ENCOUNTER — Ambulatory Visit
Admission: RE | Admit: 2019-04-30 | Discharge: 2019-04-30 | Disposition: A | Discharge status: Home / Self Care | Payer: Federal, State, Local not specified - PPO | Source: Ambulatory Visit | Attending: Radiation Oncology | Admitting: Radiation Oncology

## 2019-04-30 ENCOUNTER — Other Ambulatory Visit: Payer: Self-pay

## 2019-04-30 DIAGNOSIS — C50411 Malignant neoplasm of upper-outer quadrant of right female breast: Secondary | ICD-10-CM | POA: Diagnosis not present

## 2019-05-01 ENCOUNTER — Other Ambulatory Visit: Payer: Self-pay

## 2019-05-01 ENCOUNTER — Ambulatory Visit (HOSPITAL_COMMUNITY)
Admission: RE | Admit: 2019-05-01 | Discharge: 2019-05-01 | Disposition: A | Discharge status: Home / Self Care | Payer: Federal, State, Local not specified - PPO | Source: Ambulatory Visit | Attending: Hematology | Admitting: Hematology

## 2019-05-01 ENCOUNTER — Ambulatory Visit
Admission: RE | Admit: 2019-05-01 | Discharge: 2019-05-01 | Disposition: A | Discharge status: Home / Self Care | Payer: Federal, State, Local not specified - PPO | Source: Ambulatory Visit | Attending: Radiation Oncology | Admitting: Radiation Oncology

## 2019-05-01 DIAGNOSIS — Z170 Estrogen receptor positive status [ER+]: Secondary | ICD-10-CM

## 2019-05-01 DIAGNOSIS — C50411 Malignant neoplasm of upper-outer quadrant of right female breast: Secondary | ICD-10-CM | POA: Diagnosis present

## 2019-05-01 DIAGNOSIS — I358 Other nonrheumatic aortic valve disorders: Secondary | ICD-10-CM | POA: Diagnosis not present

## 2019-05-01 NOTE — Progress Notes (Signed)
  Echocardiogram 2D Echocardiogram has been performed.  Matilde Bash 05/01/2019, 2:38 PM

## 2019-05-03 ENCOUNTER — Encounter: Payer: Self-pay | Admitting: Internal Medicine

## 2019-05-03 ENCOUNTER — Encounter: Payer: Self-pay | Admitting: Hematology

## 2019-05-04 ENCOUNTER — Other Ambulatory Visit: Payer: Self-pay

## 2019-05-04 ENCOUNTER — Ambulatory Visit
Admission: RE | Admit: 2019-05-04 | Discharge: 2019-05-04 | Disposition: A | Discharge status: Home / Self Care | Payer: Federal, State, Local not specified - PPO | Source: Ambulatory Visit | Attending: Radiation Oncology | Admitting: Radiation Oncology

## 2019-05-04 DIAGNOSIS — C50411 Malignant neoplasm of upper-outer quadrant of right female breast: Secondary | ICD-10-CM | POA: Diagnosis not present

## 2019-05-05 ENCOUNTER — Other Ambulatory Visit: Payer: Self-pay

## 2019-05-05 ENCOUNTER — Telehealth: Payer: Self-pay | Admitting: Neurology

## 2019-05-05 ENCOUNTER — Ambulatory Visit
Admission: RE | Admit: 2019-05-05 | Discharge: 2019-05-05 | Disposition: A | Discharge status: Home / Self Care | Payer: Federal, State, Local not specified - PPO | Source: Ambulatory Visit | Attending: Radiation Oncology | Admitting: Radiation Oncology

## 2019-05-05 ENCOUNTER — Telehealth: Payer: Self-pay | Admitting: Hematology

## 2019-05-05 DIAGNOSIS — C50411 Malignant neoplasm of upper-outer quadrant of right female breast: Secondary | ICD-10-CM | POA: Diagnosis not present

## 2019-05-05 NOTE — Telephone Encounter (Signed)
I spoke to the patient and she has been scheduled for an appt on 05/12/2019.

## 2019-05-05 NOTE — Telephone Encounter (Signed)
Please give her a follow up visit with me at my next available, I was contact by her oncologist for treatment for muscle spasm

## 2019-05-05 NOTE — Telephone Encounter (Signed)
Open in error

## 2019-05-06 ENCOUNTER — Ambulatory Visit
Admission: RE | Admit: 2019-05-06 | Discharge: 2019-05-06 | Disposition: A | Discharge status: Home / Self Care | Payer: Federal, State, Local not specified - PPO | Source: Ambulatory Visit | Attending: Radiation Oncology | Admitting: Radiation Oncology

## 2019-05-06 ENCOUNTER — Other Ambulatory Visit: Payer: Self-pay

## 2019-05-06 DIAGNOSIS — C50411 Malignant neoplasm of upper-outer quadrant of right female breast: Secondary | ICD-10-CM | POA: Diagnosis not present

## 2019-05-07 ENCOUNTER — Ambulatory Visit
Admission: RE | Admit: 2019-05-07 | Discharge: 2019-05-07 | Disposition: A | Discharge status: Home / Self Care | Payer: Federal, State, Local not specified - PPO | Source: Ambulatory Visit | Attending: Radiation Oncology | Admitting: Radiation Oncology

## 2019-05-07 DIAGNOSIS — C50411 Malignant neoplasm of upper-outer quadrant of right female breast: Secondary | ICD-10-CM | POA: Diagnosis not present

## 2019-05-08 ENCOUNTER — Other Ambulatory Visit: Payer: Self-pay

## 2019-05-08 ENCOUNTER — Ambulatory Visit: Admission: RE | Admit: 2019-05-08 | Payer: Federal, State, Local not specified - PPO | Source: Ambulatory Visit

## 2019-05-11 ENCOUNTER — Ambulatory Visit
Admission: RE | Admit: 2019-05-11 | Discharge: 2019-05-11 | Disposition: A | Discharge status: Home / Self Care | Payer: Federal, State, Local not specified - PPO | Source: Ambulatory Visit | Attending: Radiation Oncology | Admitting: Radiation Oncology

## 2019-05-11 ENCOUNTER — Other Ambulatory Visit: Payer: Self-pay | Admitting: Radiation Oncology

## 2019-05-11 DIAGNOSIS — Z170 Estrogen receptor positive status [ER+]: Secondary | ICD-10-CM

## 2019-05-11 DIAGNOSIS — C50411 Malignant neoplasm of upper-outer quadrant of right female breast: Secondary | ICD-10-CM | POA: Diagnosis not present

## 2019-05-11 MED ORDER — OXYCODONE HCL 5 MG PO TABS: 5.0000 mg | ORAL_TABLET | Freq: Four times a day (QID) | ORAL | 0 refills | Status: DC | PRN

## 2019-05-11 MED ORDER — SONAFINE EX EMUL
1.0000 "application " | Freq: Two times a day (BID) | CUTANEOUS | Status: DC
  Administered 2019-05-11: 1 via TOPICAL

## 2019-05-11 MED ORDER — RADIAPLEXRX EX GEL
Freq: Once | CUTANEOUS | Status: AC
  Administered 2019-05-11: 16:00:00 via TOPICAL

## 2019-05-11 NOTE — Progress Notes (Signed)
[place]   Telephone:(336) [phone] [phone]   Clinic Follow up Note   Patient Care Team: Hoyt Koch, MD as PCP - General (Internal Medicine) Mauro Kaufmann, RN as Oncology Nurse Navigator Rockwell Germany, RN as Oncology Nurse Navigator Excell Seltzer, MD as Consulting Physician (General Surgery) Truitt Merle, MD as Consulting Physician (Hematology) Gery Pray, MD as Consulting Physician (Radiation Oncology)  Date of Service:  05/15/2019  CHIEF COMPLAINT: F/u of right breast cancer  SUMMARY OF ONCOLOGIC HISTORY: Oncology History Overview Note  Cancer Staging Malignant neoplasm of upper-outer quadrant of right breast in female, estrogen receptor positive ([place]) Staging form: Breast, AJCC 8th Edition - Clinical stage from 11/10/2018: Stage IA (cT1b, cN0, cM0, G2, ER+, PR+, HER2+) - Signed by Truitt Merle, MD on 11/18/2018     Malignant neoplasm of upper-outer quadrant of right breast in female, estrogen receptor positive ([place])  11/07/2018 Mammogram   Diagnostic 11/07/18 IMPRESSION: 1. 1 x 0.7 x 0.6 cm hypoechoic mass with distortion at the 9-9:30 position of the RIGHT breast 6 cm from the nipple with distortion in the UPPER-OUTER RIGHT breast, corresponding to the mammographic abnormality. One RIGHT axillary lymph node with borderline cortical thickness. Tissue sampling of the RIGHT breast mass and borderline RIGHT axillary lymph node recommended. ADDENDUM: Patient returned today for biopsy of a single borderline thickened lymph node in the RIGHT axilla. I could not reproduce a morphologically abnormal lymph node today, possibly resolved in the interval. Today, only normal appearing lymph nodes were identified in the RIGHT axilla. As such, the axillary ultrasound-guided biopsy was canceled.   11/10/2018 Cancer Staging   Staging form: Breast, AJCC 8th Edition - Clinical stage from 11/10/2018: Stage IA (cT1b, cN0, cM0, G2, ER+, PR+, HER2+) -  Signed by Truitt Merle, MD on 11/18/2018   11/11/2018 Initial Biopsy   Diagnosis 11/11/18 Breast, right, needle core biopsy, upper outer - 9:30 o'clock position - INVASIVE DUCTAL CARCINOMA, GRADE II/III. - SEE MICROSCOPIC DESCRIPTION.   11/11/2018 Receptors her2   Results: IMMUNOHISTOCHEMICAL AND MORPHOMETRIC ANALYSIS PERFORMED MANUALLY The tumor cells are POSITIVE for Her2 (3+). Estrogen Receptor: 95%, POSITIVE, STRONG STAINING INTENSITY Progesterone Receptor: 5%, POSITIVE, STRONG STAINING INTENSITY Proliferation Marker Ki67: 20%   11/17/2018 Initial Diagnosis   Malignant neoplasm of upper-outer quadrant of right breast in female, estrogen receptor positive ([place])   11/28/2018 Surgery   RIGHT BREAST LUMPECTOMY WITH RADIOACTIVE SEED AND RIGHT AXILLARY SENTINEL LYMPH NODE BIOPSY by Dr. Excell Seltzer  11/28/18    11/28/2018 Pathology Results   Diagnosis 11/28/18  1. Breast, lumpectomy, Right w/seed - INVASIVE DUCTAL CARCINOMA, 1.6 CM, NOTTINGHAM GRADE 2 OF 3. - MARGINS OF RESECTION ARE NOT INVOLVED (CLOSEST MARGIN: LESS THAN 1 MM, ANTERIOR). - DUCTAL CARCINOMA IN SITU. - BIOPSY SITE CHANGES. - SEE ONCOLOGY TABLE. 2. Lymph node, sentinel, biopsy, right Axillary - ONE LYMPH NODE, NEGATIVE FOR CARCINOMA (0/1). 3. Lymph node, sentinel, biopsy, right Axillary - ONE LYMPH NODE, NEGATIVE FOR CARCINOMA (0/1).   11/28/2018 Cancer Staging   Staging form: Breast, AJCC 8th Edition - Pathologic stage from 11/28/2018: Stage IA (pT1c, pN0, cM0, G2, ER+, PR+, HER2+) - Signed by Truitt Merle, MD on 12/11/2018   12/22/2018 Procedure   Baseline ECHO 12/22/18  IMPRESSIONS  1. The left ventricle has normal systolic function with an ejection fraction of 60-65%. The cavity size was normal. There is mild concentric left ventricular hypertrophy. Left ventricular diastolic parameters were normal.  2. The right ventricle has normal systolic function. The cavity  was normal. There is no increase in right ventricular wall thickness.   3. Left atrial size was moderately dilated.  4. The aortic valve is tricuspid. Aortic valve regurgitation was not assessed by color flow Doppler.    01/02/2019 -  Chemotherapy   Adjuvant chemo weekly Taxol with Herceptin starting 01/02/19. Will d/c after 02/13/19 and will switch to Kadcyla q3weeks starting 02/20/19 due to worsening neuropathy. Due to worsening Muscle cramps Kadcyla stopped 04/03/19 and switched to Herceptin q3weeks on 04/23/19.    04/09/2019 -  Radiation Therapy   RT with Dr. Sondra Come 04/09/19-05/28/19      CURRENT THERAPY:  -Adjuvant chemo weekly Taxol with Herceptin starting 01/02/19. Will d/c after 02/13/19 and will switch to Kadcyla q3weeks starting 02/20/19 due to worsening neuropathy. Due to worsening Muscle cramps will switch Kadcyla to Herceptin on 04/23/19  -IV iron as needed -RT 04/09/19-05/27/28   INTERVAL HISTORY:  Summer [name] is here for a follow up and treatment. She presents to the clinic alone. She notes she saw Dr Krista Blue who started her on Gabapentin now 2 tabs of 626m TID. That is the max of the neuralgia but given her neuropathy it can be increased. However even with this high dose it was not enough to control her pain back in late night/early morning. This interrupts her sleep. She feels her muscle cramping is getting worse. She plans to do nerve study with Dr YKrista Blueon 06/24/19. She took Cymbalta before and did not work for her. She has also tried many other medications without success.  She is tolerating Herceptin well. She notes skin redness and darkness and soreness of her right breast and axilla from RT.    REVIEW OF SYSTEMS:   Constitutional: Denies fevers, chills or abnormal weight loss Eyes: Denies blurriness of vision Ears, nose, mouth, throat, and face: Denies mucositis or sore throat Respiratory: Denies cough, dyspnea or wheezes Cardiovascular: Denies palpitation, chest discomfort or lower extremity swelling Gastrointestinal:  Denies nausea,  heartburn or change in bowel habits Skin: Denies abnormal skin rashes MSK: (+) worsening muscle cramping  Lymphatics: Denies new lymphadenopathy or easy bruising Neurological: (+) stable neuralgia with numbness of her lips Behavioral/Psych: Mood is stable, no new changes  Breast: (+) right breast redness, darkness and soreness from RT All other systems were reviewed with the patient and are negative.  MEDICAL HISTORY:  Past Medical History:  Diagnosis Date  . Abdominal pain, left lower quadrant 11/09/2009   Qualifier: Diagnosis of  By: NValma CavaLPN, NIzora Gala   . Allergic rhinitis 08/26/2008   Qualifier: Diagnosis of  By: TSherren MochaMD, JJory Ee  . Anemia, iron deficiency 11/21/2018  . Arthritis shoulders and back  . BACK PAIN, CHRONIC 06/13/2007   Qualifier: Diagnosis of  By: TSherren MochaMD, JJory Ee  . 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 Constipation 07/01/2018  . Costochondritis 05/04/2014  . Diverticulitis of colon 06/13/2007   S/p segmental colectomy for perforated diverticulitis   . Diverticulosis   . Facial pain 10/06/2015  . Fibromyalgia   . Foot sprain, left, initial encounter 08/31/2016  . Glossitis 02/02/2016  . Headache 04/24/2016  . HEMATURIA UNSPECIFIED 06/25/2008   Qualifier: Diagnosis of  By: TSherren MochaMD, JJory Ee  . HEMATURIA, HX OF 11/14/2009   Qualifier: Diagnosis of  By: VRegino SchultzeCMA (AAMA), RApolonio Schneiders   . History of kidney stones   . History of kidney stones   .  History of shingles 2012--  no residual pain  . Itching 10/13/2018  . Left eye pain 07/01/2018  . Left ureteral calculus   . LLQ pain 04/24/2016  . LOC OSTEOARTHROS NOT SPEC PRIM/SEC OTH [facility] SITE 06/27/2009   Qualifier: Diagnosis of  By: Sherren Mocha MD, Jory Ee   . Malignant neoplasm of upper-outer quadrant of right breast in female, estrogen receptor positive ([place]) 11/17/2018  . MCI (mild cognitive impairment)   . Memory loss 12/21/2016  . Morbid obesity ([place]) 11/09/2010  . Pain  of right side of body 08/31/2016  . Pruritus 10/26/2016  . Right sided temporal headache 12/21/2016  . Tremor 12/21/2016  . Trigeminal neuralgia RIGHT  . Trigeminal neuralgia of right side of face 11/30/2010  . Trochanteric bursitis, right hip 10/26/2016  . Urgency of urination   . Weight loss 07/22/2017  . Zoster without complications 04/19/1750    SURGICAL HISTORY: Past Surgical History:  Procedure Laterality Date  . BREAST LUMPECTOMY WITH RADIOACTIVE SEED AND SENTINEL LYMPH NODE BIOPSY Right 11/28/2018   Procedure: RIGHT BREAST LUMPECTOMY WITH RADIOACTIVE SEED AND RIGHT AXILLARY SENTINEL LYMPH NODE BIOPSY;  Surgeon: Excell Seltzer, MD;  Location: [facility];  Service: General;  Laterality: Right;  . CEREBRAL MICROVASCULAR DECOMPRESSION  07-05-2006   RIGHT TRIGEMINAL NERVE  . CYSTOSCOPY/RETROGRADE/URETEROSCOPY/STONE EXTRACTION WITH BASKET  07/04/2012   Procedure: CYSTOSCOPY/RETROGRADE/URETEROSCOPY/STONE EXTRACTION WITH BASKET;  Surgeon: Claybon Jabs, MD;  Location: [facility];  Service: Urology;  Laterality: Left;  . EXPLORATORY LAPAROTOMY/ RESECTION MID TO DISTAL SIGMOID AND PROXIMAL RECTUM/ END PROXIMAL SIGMOID COLOSTOMY  07-17-2006   PERFORATED DIVERTICULITIS WITH PERITONITIS  . gamma knife  05/17/2016   for trigeminal neuralgia, [facility], Dr Salomon Fick  . gamma knife  2019  . PORTACATH PLACEMENT Left 11/28/2018   Procedure: INSERTION PORT-A-CATH WITH ULTRASOUND;  Surgeon: Excell Seltzer, MD;  Location: [facility];  Service: General;  Laterality: Left;  . RESECTION COLOSTOMY/ CLOSURE COLOSTOMY WITH COLOPROCTOSTOMY  02-20-2007  . TONSILLECTOMY  AS CHILD  . URETEROSCOPY  07/04/2012   Procedure: URETEROSCOPY;  Surgeon: Claybon Jabs, MD;  Location[facility];  Service: Urology;  Laterality: Left;  Marland Kitchen VAGINAL HYSTERECTOMY  1998   Partial  . WISDOM TOOTH EXTRACTION      I have reviewed the social history and family history with the patient and they are  unchanged from previous note.  ALLERGIES:  is allergic to clindamycin hcl.  MEDICATIONS:  Current Outpatient Medications  Medication Sig Dispense Refill  . acetaminophen (TYLENOL) 500 MG tablet Take 1,000 mg by mouth every 6 (six) hours as needed.    . gabapentin (NEURONTIN) 600 MG tablet TAKE 2 TABLETS TID. Extra one tab at bed time 630 tablet 4  . lidocaine-prilocaine (EMLA) cream Apply to affected area once 30 g 3  . triamcinolone ointment (KENALOG) 0.1 % Apply 1 application topically 3 (three) times daily. 90 g 0   No current facility-administered medications for this visit.     PHYSICAL EXAMINATION: ECOG PERFORMANCE STATUS: 1 - Symptomatic but completely ambulatory  Vitals with BMI 05/15/2019  Height   Weight   BMI   Systolic 025  Diastolic 65  Pulse 73   GENERAL:alert, no distress and comfortable SKIN: skin color, texture, turgor are normal, no rashes or significant lesions EYES: normal, Conjunctiva are pink and non-injected, sclera clear  NECK: supple, thyroid normal size, non-tender, without nodularity LYMPH:  no palpable lymphadenopathy in the cervical, axillary  LUNGS: clear to auscultation and percussion with normal  breathing effort HEART: regular rate & rhythm and no murmurs and no lower extremity edema ABDOMEN:abdomen soft, non-tender and normal bowel sounds Musculoskeletal:no cyanosis of digits and no clubbing  NEURO: alert & oriented x 3 with fluent speech, no focal motor/sensory deficits  LABORATORY DATA:  I have reviewed the data as listed CBC Latest Ref Rng & Units 05/15/2019 04/23/2019 04/03/2019  WBC 4.0 - 10.5 K/uL 5.1 6.3 4.9  Hemoglobin 12.0 - 15.0 g/dL 12.8 12.9 13.0  Hematocrit 36.0 - 46.0 % 40.4 40.7 41.2  Platelets 150 - 400 K/uL 158 126(L) 164     CMP Latest Ref Rng & Units 05/15/2019 04/23/2019 04/03/2019  Glucose 70 - 99 mg/dL 84 92 130(H)  BUN 8 - 23 mg/dL 13 12 7(L)  Creatinine 0.44 - 1.00 mg/dL 0.73 0.74 0.76  Sodium 135 - 145 mmol/L 140 140  142  Potassium 3.5 - 5.1 mmol/L 3.8 3.8 3.5  Chloride 98 - 111 mmol/L 105 102 106  CO2 22 - 32 mmol/L _0 Calcium 8.9 - 10.3 mg/dL 8.7(L) 9.3 9.0  Total Protein 6.5 - 8.1 g/dL 6.6 6.8 6.6  Total Bilirubin 0.3 - 1.2 mg/dL 0.4 0.8 0.7  Alkaline Phos 38 - 126 U/L 88 93 104  AST 15 - 41 U/L _1 ALT 0 - 44 U/L _2 RADIOGRAPHIC STUDIES: I have personally reviewed the radiological images as listed and agreed with the findings in the report. No results found.   ASSESSMENT & PLAN:  Summer [name] is a 68 y.o. female with   1.Malignant neoplasm of upper-outer quadrant of right breast, StageIA,pT1b,N0,M0, ER/PR+, HER2+, GradeII -She was recently diagnosed in 11/2018. She is s/p right breastlumpectomyand SLNB as of 11/28/18.  -Herpathology results showed her small grade II tumor was completely resected with negative margins and node negative. -She startedadjuvantweekly Taxol with Herceptinon4/24/20butstopped after 6 weeks therapydue to worsening neuropathyand infusion reaction. -Adjuvant chemo changed toTDM-1(Kadcyla)q3weeksstarting6/12/20. If she tolerates we will continueanti-her2 treatmentuntil 12/2019. I will likelyswitchto maintenanceHerceptinlater in her treatment orsooner if she has poor toleranceof Kadcyla.  -She started RT with Dr. Sondra Come on 04/09/19 and plan to complete on 05/28/19.She is tolerating moderately well with skin change and soreness.  -Given her significant worsening muscle cramps stopped Kadcyla and restarted her Herceptin on 04/23/19.  -Labs reviewed, CBC and CMP WNL except Ca 8.7, albumin 3.2. Overall adequate to proceed withHerceptin today. I encouraged her to increase protein in her diet.  -Last ECHO EF was mildly lower but still in normal range on 05/01/19 (from 60-65% to 55-60%). I will ask Dr. Radford Pax to review it and may repeat her echo sooner than 3 months  -continue Herceptin every 3 weeks  -continue RT  -F/u  in 6 weeks to discuss start of antiestrogen therapy.  2. Significant Muscle Cramps and body pain  -Worsened lately, now diffuse across body, especially in her legs and feet.  -I advised her to increase her water intake and I also recommend OTC Magnesium, potassium supplements and increase calcium in diet.  -She has not felt relief with Robaxin, OTC Tylenol and Ibuprofen. On 04/23/19 I called in Tramadol 49m q6-8hours as needed. I do not plan to refill.  -I discussed Kadcyla is the only new medication she is on, it is likely her pain is related to it. I switched her to Herceptin on 04/23/19.  -She notes her muscle cramping has worsened lately. She has been seen by her neurologist Dr  Krista Blue who increased her Gabapenitn to max dose 2 tabs 623m TID. This is still not controlling all her pain from this, neuralgia and Neuropathy. She will have nerve study on 1014/20.   3. Anemia,iron deficiency -Pt notes she has been anemic in the past year -Last colonoscopy was in 04/2012 which wasunremarkable except diverticulosis.  -She had partial colectomy in the past due to severe diverticulosis. -This is possiblyiron deficiency from diverticulosis.She denies any current bleeding.  -After COVID-19 improvesand her chemo treatmentI will refer herbacktoDr. Gessnerfor colonoscopy -Her workup showed iron 17, ferritin 4, normal retic ct, folate Vitamin B12 and SPEP panels. -IV iron was given 3/18/20and 12/12/18.  -will monitor her iron level every month -Hgnormalizedrecently and 04/2019 iron panel normal  4. Trigeminal neuralgia  -onGabapentin -She has been having moderate right side Has which I suspect is exacerbation of her neuralgia secondary to chemo. Will monitor.  -She has oxycodone which she can use for significant pain.  -5/26/20brain and neck MRIwere stable. -Has not been completely controlled but managing with occasionally worsened flares.  -With this, neuropathy and recent muscle  cramping she is now on 2 tabs 606mTID.    5.PeripheralNeuropathy, G1-2 -Secondary to chemoTaxolstarted aftercycle 3. -I discussed neuropathy can progress with more treatment. Will monitor  -PA Vannpreviouslystarted her on Robaxin. She can take Tylenol in between for breakthrough pain.Continue -Taxol switched toTDM-1 Kadcylaon 02/20/19 which was switched to Herceptin on 04/23/19 -Her neuropathynow stable with numbnessin her left toes and tinglingin her fingertips.Stable.  -Continue B12 complex. I also encouraged her to exercise. -With this, neuralgia and recent muscle cramping she is now on 2 tabs 60055mID.      PLAN: -Labs reviewed andadequateto proceed withHerceptin today -Continue RT -Lab, flush, andHerceptinin 3, 6 and 9 weeks -F/u in 6 weeks to start AI    No problem-specific Assessment & Plan notes found for this encounter.   No orders of the defined types were placed in this encounter.  All questions were answered. The patient knows to call the clinic with any problems, questions or concerns. No barriers to learning was detected. I spent 20 minutes counseling the patient face to face. The total time spent in the appointment was 25 minutes and more than 50% was on counseling and review of test results     YanTruitt MerleD 05/15/2019   I, AmoJoslyn Devonm acting as scribe for YanTruitt MerleD.   I have reviewed the above documentation for accuracy and completeness, and I agree with the above.

## 2019-05-12 ENCOUNTER — Other Ambulatory Visit: Payer: Self-pay | Admitting: *Deleted

## 2019-05-12 ENCOUNTER — Ambulatory Visit
Admission: RE | Admit: 2019-05-12 | Discharge: 2019-05-12 | Disposition: A | Discharge status: Home / Self Care | Payer: Federal, State, Local not specified - PPO | Source: Ambulatory Visit | Attending: Radiation Oncology | Admitting: Radiation Oncology

## 2019-05-12 ENCOUNTER — Encounter: Payer: Self-pay | Admitting: Neurology

## 2019-05-12 ENCOUNTER — Other Ambulatory Visit: Payer: Self-pay

## 2019-05-12 ENCOUNTER — Ambulatory Visit (INDEPENDENT_AMBULATORY_CARE_PROVIDER_SITE_OTHER): Payer: Federal, State, Local not specified - PPO | Admitting: Neurology

## 2019-05-12 ENCOUNTER — Telehealth: Payer: Self-pay | Admitting: Neurology

## 2019-05-12 VITALS — BP 122/69 | HR 71 | Temp 98.0°F | Ht 66.0 in | Wt 182.5 lb

## 2019-05-12 DIAGNOSIS — Z170 Estrogen receptor positive status [ER+]: Secondary | ICD-10-CM | POA: Insufficient documentation

## 2019-05-12 DIAGNOSIS — C50411 Malignant neoplasm of upper-outer quadrant of right female breast: Secondary | ICD-10-CM | POA: Diagnosis present

## 2019-05-12 DIAGNOSIS — G500 Trigeminal neuralgia: Secondary | ICD-10-CM

## 2019-05-12 DIAGNOSIS — Z510 Encounter for antineoplastic radiation therapy: Secondary | ICD-10-CM | POA: Diagnosis not present

## 2019-05-12 DIAGNOSIS — R252 Cramp and spasm: Secondary | ICD-10-CM | POA: Insufficient documentation

## 2019-05-12 MED ORDER — GABAPENTIN 600 MG PO TABS: 600.0000 mg | ORAL_TABLET | Freq: Three times a day (TID) | ORAL | 4 refills | Status: DC

## 2019-05-12 MED ORDER — GABAPENTIN 600 MG PO TABS: ORAL_TABLET | ORAL | 4 refills | Status: DC

## 2019-05-12 NOTE — Telephone Encounter (Signed)
I returned the call to the pharmacy and clarified the gabapentin prescription.

## 2019-05-12 NOTE — Telephone Encounter (Signed)
Summer [name] @[facility]  has called to discuss pt's script for  gabapentin (NEURONTIN) 600 MG tablet please call

## 2019-05-12 NOTE — Progress Notes (Signed)
PATIENT: Summer [name] DOB: 1951-07-11  REASON FOR VISIT: follow up HISTORY FROM: patient  HISTORY OF PRESENT ILLNESS: Today 05/12/19  Ms. Fishel is a 68 year old female presenting for follow-up for trigeminal neuralgia. She has had 2 gamma knife procedures most recent in 2019. She thinks she isn't feeling as good as she had hoped, in fact she thinks it may have made her worse. She still has pain to right side of her face. Pain under her eye, and pain under her chin. Her tongue sometimes feels like sandpaper. She is taking gabapentin 600 mg capsules, three in the morning, three midday, two at bedtime. When she takes the gabapentin it settles the pain down, making it manageable. Rarely she will take lamictal at bedtime. She reports 25% of the day she is pain free. In the morning is the worst pain. She doesn't like Oxtellar, because it doesn't work in her opinion. In the past she has tried lyrica, gabapentin, tegretol, trileptal, and cymbalta. 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    HISTORY 10/10/17 CM Ms. Whetsel is a 68 years old right-handed African American female, referred by her primary care physician Dr. Stevie Kern for evaluation of right facial pain  She was diagnosed with right trigeminal neuralgia in 2006, presenting with severe electronics shooting pain at her right upper molar, she had microvascular decompression surgery at [facility] by Dr. Harrison Mons around 2006, which has helped the severe pain, but she continued to have annoying almost constant pain at her right face, she describes hot cold sensitivity over right face, touching of right forehead region will also set of her right facial pain, she felt right tongue swelling, she bites on her right tongue sometimes, she works at customer service, on the phone many hours each day, with prolonged talking, she felt sandpaper was rubbing against her right tongue, also complains of right upper  and lower lip numbness, intermittent electronics shooting pain,  Over the years, she has tried different medication, was evaluated by our office Dr. Brett Fairy in 2008, she has tried gabapentin, Lyrica, Cymbalta, Trileptal, currently is taking Tegretol 200 mg twice a day, initially it helps her some, but failed to help work consistently, She denied visual change, recent few months of left lower back, left hip pain, radiating to left posterior thigh, to her left leg and left foot, difficulty walking after prolonged sitting She complains of left low back pain, left hip pain, radiating pain to left foot, like walking on a dead, burning sensation, she continued to complains of right facial constant achy pain, there was no significant change after started on Oxtellar xR 300 mg every day, no significant side effects,  I have reviewed MRI, mild small vessel disease, no significant abnormality otherwise,  She had MRI lumbar spine (without) demonstrating: L5-S1: facet hypertrophy with moderate right and severe left foraminal stenosis  L4-5: pseudo disc bulging and facet hypertrophy with moderate right and mild left foraminal stenosis. There is 2 mm anterior spondylolisthesis of L4 on L5.  Degenerative spondylosis and disc bulging L1-2 and L4-5.  UPDATE Sep 30 2014:YY She felt sand paper sensation in her right tongue, flashing sensation, last 1-2 weeks, only take gabapentin 325m tid now. She also complains of snoring, frequent wakening during the nighttime, excessive sleepiness at daytime, F ESS score is 37, ESS is 9  UPDATE 01/26/2017CMMs. LFlis 68year old female returns for follow-up. She has a history of right trigeminal neuralgia is currently on Neurontin  300 mg 3 times a day has breakthrough pain. 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 She occasionally will have upper and lower lip numbness. She returns  for reevaluation  UPDATE February 09 2016:YY She complained increased right facial pain since April 2017, involving right V1/V2 branch, she is taking gabapentin 300 mg 4 times a day, only helped her mildly, triggered by wind blow on her right face, talking, chewing, she works as a Restaurant manager, fast food, it is very difficult for her to perform her job,  Update March 01 2016:YY She presented to the emergency room February 21 2016 for nausea, unexpected fall, night before that because severe facial pain, she took extra dose of lamotrigine, made it to 250 mg that particular day, laboratory evaluation showed mildly low potassium of CMP WAS GIVEN SUPPLEMENT, NORMAL CBC, lamotrigine level in June 15 was 15.6,  For a while, she had severe right facial pain, was taking gabapentin 300 mg 2 tablets every 2 hours, 3 tablets at night, about 11 tablets each day 3300 mg every day plus lamotrigine 100 mg twice a day,   Lamotrigine 100 mg twice a day has been very helpful, it has taking care of her long bothered right tongue sandpaper sensation. She is now taking gabapentin 300 mg 3/2/3 tablets each day, she has been off lamotrigine since February 26 2016, the sandpaper sensation on the right side of the tongue came back, she is now taking Cymbalta 30 mg 3 times a day  She has been complains 3 weeks history of urinary urgency, difficulty initiating urine, UA showed mild UTI, was treated with Cipro without improving her symptoms  UPDATE May 10 2016:YY She was evaluated by Dr. Salomon Fick in July 2017, is planning on to have gamma knife in early September, she complains of mild memory loss, difficulty handling her computer tasks sometimes, there was no family history of memory loss, she also complains of episode, right after she get out of the bed, She is on polypharmacy, now taking lamotrigine 100 mg twice a day, gabapentin 300 mg 4 in the morning, 2 at lunch time, 4 tablets at nighttime. She also has obesity, snoring,  excessive daytime fatigue and sleepiness  We have personally reviewed CT head without contrast in June 2017, no acute abnormality, MRI of the cervical in 2008, multilevel degenerative changes, most severe at C5-6, C6-7, with herniated disc, mild canal stenosis, mild cord signal changes.  Laboratory evaluation in 2017, elevated WBC 11 point 8, hemoglobin of 13, no significant abnormality on BMP  UPDATE Apr 02 2017:YY We have personally reviewed MRI of the brain in April 2018 generalized atrophy mild supratentorium small vessel disease MRI of cervical spine April, multilevel degenerative changes most severe at C6-7, mild canal stenosissignificant foraminal narrowing or signal changes,  She had a, knife by [facility] Dr. Angelene Giovanni in September 2017 she denies significant improvement, now cold air still trigger her right facial pain, bleeding from right temporal region along the right face, right jaw, electronic shooting pain, to her right lower molar region, she has intermittent left handtremor,driving,  She is now taking Lamictal 100 mg 3 tablets each day, gabapentin 600 mg 5 tablets each day, despite combination therapy, she remains symptomatic, she is able to go to work. She is on the phone 10 hours a day, mild memory loss. I reviewed laboratory evaluation in 2018, negative troponin, CBC with hemoglobin  of 11.9, CMP, creatinine of 0.84, ESR, mild elevated C reactive protein 7.8, lamotrigine 12.9, TSH 1.2 7,   UPDATE Sept 1 2020: She was diagnosed with right breast cancer, had right lobectomy followed by chemo and radiation therapy, since April 2020, she noticed increased bilateral feet paresthesia, and also intermittent bilateral finger paresthesia, despite taking gabapentin 600 mg up to 3 times a day, her right trigeminal pain is under good control with current dose, she also has frequent lower extremity muscle spasm, has tried muscle relaxant methocarbamol with limited help   REVIEW OF  SYSTEMS: Out of a complete 14 system review of symptoms, the patient complains only of the following symptoms, and all other reviewed systems are negative  Facial pain   ALLERGIES: Allergies  Allergen Reactions  . Clindamycin Hcl Itching    HOME MEDICATIONS: Outpatient Medications Prior to Visit  Medication Sig Dispense Refill  . acetaminophen (TYLENOL) 500 MG tablet Take 1,000 mg by mouth every 6 (six) hours as needed (pain).     . famotidine (PEPCID) 40 MG tablet Take 1 tablet (40 mg total) by mouth daily. 30 tablet 3  . gabapentin (NEURONTIN) 600 MG tablet TAKE 2 TABLETS TID. MAX OF SIX TABS PER DAY. (Patient taking differently: Take 1,200 mg by mouth 3 (three) times daily. TAKE 2 TABLETS TID. MAX OF SIX TABS PER DAY.) 540 tablet 3  . lidocaine-prilocaine (EMLA) cream Apply to affected area once 30 g 3  . methocarbamol (ROBAXIN) 500 MG tablet Take 1 tablet (500 mg total) by mouth at bedtime as needed for muscle spasms. 30 tablet 1  . metoprolol tartrate (LOPRESSOR) 100 MG tablet Take 1 tablet, 100 mg, 2 hours before your cardiac CT. 1 tablet 0  . neomycin-colistin-hydrocortisone-thonzonium (CORTISPORIN-TC) 3.11-10-08-0.5 MG/ML OTIC suspension Place 3 drops into both ears 3 (three) times daily. 10 mL 0  . omeprazole (PRILOSEC) 40 MG capsule Take 1 capsule (40 mg total) by mouth daily. 30 capsule 5  . oxyCODONE (OXY IR/ROXICODONE) 5 MG immediate release tablet Take 1 tablet (5 mg total) by mouth every 6 (six) hours as needed for moderate pain or severe pain. 30 tablet 0  . traMADol (ULTRAM) 50 MG tablet Take 1 tablet (50 mg total) by mouth every 6 (six) hours as needed. 30 tablet 0  . triamcinolone ointment (KENALOG) 0.1 % Apply 1 application topically 3 (three) times daily. 90 g 0   No facility-administered medications prior to visit.     PAST MEDICAL HISTORY: Past Medical History:  Diagnosis Date  . Abdominal pain, left lower quadrant 11/09/2009   Qualifier: Diagnosis of  By: Valma Cava  LPN, Izora Gala    . Allergic rhinitis 08/26/2008   Qualifier: Diagnosis of  By: Sherren Mocha MD, Jory Ee   . Anemia, iron deficiency 11/21/2018  . Arthritis shoulders and back  . BACK PAIN, CHRONIC 06/13/2007   Qualifier: Diagnosis of  By: Sherren Mocha MD, Jory Ee   . 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 Constipation 07/01/2018  . Costochondritis 05/04/2014  . Diverticulitis of colon 06/13/2007   S/p segmental colectomy for perforated diverticulitis   . Diverticulosis   . Facial pain 10/06/2015  . Fibromyalgia   . Foot sprain, left, initial encounter 08/31/2016  . Glossitis 02/02/2016  . Headache 04/24/2016  . HEMATURIA UNSPECIFIED 06/25/2008   Qualifier: Diagnosis of  By: Sherren Mocha MD, Jory Ee   . HEMATURIA, HX OF 11/14/2009   Qualifier: Diagnosis of  By: Regino Schultze  CMA (AAMA), Apolonio Schneiders    . History of kidney stones   . History of kidney stones   . History of shingles 2012--  no residual pain  . Itching 10/13/2018  . Left eye pain 07/01/2018  . Left ureteral calculus   . LLQ pain 04/24/2016  . LOC OSTEOARTHROS NOT SPEC PRIM/SEC OTH [facility] SITE 06/27/2009   Qualifier: Diagnosis of  By: Sherren Mocha MD, Jory Ee   . Malignant neoplasm of upper-outer quadrant of right breast in female, estrogen receptor positive ([place]) 11/17/2018  . MCI (mild cognitive impairment)   . Memory loss 12/21/2016  . Morbid obesity ([place]) 11/09/2010  . Pain of right side of body 08/31/2016  . Pruritus 10/26/2016  . Right sided temporal headache 12/21/2016  . Tremor 12/21/2016  . Trigeminal neuralgia RIGHT  . Trigeminal neuralgia of right side of face 11/30/2010  . Trochanteric bursitis, right hip 10/26/2016  . Urgency of urination   . Weight loss 07/22/2017  . Zoster without complications 10/06/5168    PAST SURGICAL HISTORY: Past Surgical History:  Procedure Laterality Date  . BREAST LUMPECTOMY WITH RADIOACTIVE SEED AND SENTINEL LYMPH NODE BIOPSY Right 11/28/2018   Procedure: RIGHT BREAST  LUMPECTOMY WITH RADIOACTIVE SEED AND RIGHT AXILLARY SENTINEL LYMPH NODE BIOPSY;  Surgeon: Excell Seltzer, MD;  Location: [place];  Service: General;  Laterality: Right;  . CEREBRAL MICROVASCULAR DECOMPRESSION  07-05-2006   RIGHT TRIGEMINAL NERVE  . CYSTOSCOPY/RETROGRADE/URETEROSCOPY/STONE EXTRACTION WITH BASKET  07/04/2012   Procedure: CYSTOSCOPY/RETROGRADE/URETEROSCOPY/STONE EXTRACTION WITH BASKET;  Surgeon: Claybon Jabs, MD;  Location: [facility];  Service: Urology;  Laterality: Left;  . EXPLORATORY LAPAROTOMY/ RESECTION MID TO DISTAL SIGMOID AND PROXIMAL RECTUM/ END PROXIMAL SIGMOID COLOSTOMY  07-17-2006   PERFORATED DIVERTICULITIS WITH PERITONITIS  . gamma knife  05/17/2016   for trigeminal neuralgia, [facility], Dr Salomon Fick  . gamma knife  2019  . PORTACATH PLACEMENT Left 11/28/2018   Procedure: INSERTION PORT-A-CATH WITH ULTRASOUND;  Surgeon: Excell Seltzer, MD;  Location: [place];  Service: General;  Laterality: Left;  . RESECTION COLOSTOMY/ CLOSURE COLOSTOMY WITH COLOPROCTOSTOMY  02-20-2007  . TONSILLECTOMY  AS CHILD  . URETEROSCOPY  07/04/2012   Procedure: URETEROSCOPY;  Surgeon: Claybon Jabs, MD;  Location: [facility];  Service: Urology;  Laterality: Left;  Marland Kitchen VAGINAL HYSTERECTOMY  1998   Partial  . WISDOM TOOTH EXTRACTION      FAMILY HISTORY: Family History  Problem Relation Age of Onset  . Heart disease Maternal Grandmother   . Diabetes Mother   . Breast cancer Maternal Aunt   . Lupus Cousin   . Colon cancer Neg Hx   . Esophageal cancer Neg Hx   . Rectal cancer Neg Hx   . Stomach cancer Neg Hx     SOCIAL HISTORY: Social History   Socioeconomic History  . Marital status: Married    Spouse name: Not on file  . Number of children: 1  . Years of education: College  . Highest education level: Not on file  Occupational History    Employer: UNEMPLOYED  . Occupation: HUMAN RESOURCES     Employer: [employer]  Social Needs  .  Financial resource strain: Not on file  . Food insecurity    Worry: Not on file    Inability: Not on file  . Transportation needs    Medical: Not on file    Non-medical: Not on file  Tobacco Use  . Smoking status: Never Smoker  . Smokeless tobacco: Never Used  Substance and Sexual Activity  . Alcohol use: Yes    Comment: rarely  . Drug use: No  . Sexual activity: Not on file  Lifestyle  . Physical activity    Days per week: Not on file    Minutes per session: Not on file  . Stress: Not on file  Relationships  . Social Herbalist on phone: Not on file    Gets together: Not on file    Attends religious service: Not on file    Active member of club or organization: Not on file    Attends meetings of clubs or organizations: Not on file    Relationship status: Not on file  . Intimate partner violence    Fear of current or ex partner: Not on file    Emotionally abused: Not on file    Physically abused: Not on file    Forced sexual activity: Not on file  Other Topics Concern  . Not on file  Social History Narrative  . Not on file      PHYSICAL EXAM  Vitals:   05/12/19 1128  Height: _0  (1.676 m)   Body mass index is 29.71 kg/m.  Generalized: Well developed, in no acute distress   Neurological examination  Mentation: Alert oriented to time, place, history taking. Follows all commands speech and language fluent Cranial nerve II-XII: Pupils were equal round reactive to light. Extraocular movements were full, visual field were full on confrontational test. Facial sensation and strength were normal. Uvula tongue midline. Head turning and shoulder shrug  were normal and symmetric. Motor: The motor testing reveals 5 over 5 strength of all 4 extremities. Good symmetric motor tone is noted throughout.  Sensory: Decreased light touch to right side of her face V1-V3. Sensory testing is intact to soft touch on all 4 extremities. No evidence of extinction is noted.   Coordination: Cerebellar testing reveals good finger-nose-finger and heel-to-shin bilaterally.  Gait and station: Gait is normal. Tandem gait is normal. Romberg is negative. No drift is seen.  Reflexes: Deep tendon reflexes are symmetric and normal bilaterally.   DIAGNOSTIC DATA (LABS, IMAGING, TESTING) - I reviewed patient records, labs, notes, testing and imaging myself where available.  Lab Results  Component Value Date   WBC 6.3 04/23/2019   HGB 12.9 04/23/2019   HCT 40.7 04/23/2019   MCV 91.7 04/23/2019   PLT 126 (L) 04/23/2019      Component Value Date/Time   NA 140 04/23/2019 0936   K 3.8 04/23/2019 0936   CL 102 04/23/2019 0936   CO2 29 04/23/2019 0936   GLUCOSE 92 04/23/2019 0936   GLUCOSE 96 08/20/2006 1433   BUN 12 04/23/2019 0936   CREATININE 0.74 04/23/2019 0936   CREATININE 0.76 04/03/2019 0951   CALCIUM 9.3 04/23/2019 0936   PROT 6.8 04/23/2019 0936   ALBUMIN 3.3 (L) 04/23/2019 0936   AST 30 04/23/2019 0936   AST 31 04/03/2019 0951   ALT 23 04/23/2019 0936   ALT 19 04/03/2019 0951   ALKPHOS 93 04/23/2019 0936   BILITOT 0.8 04/23/2019 0936   BILITOT 0.7 04/03/2019 0951   GFRNONAA >60 04/23/2019 0936   GFRNONAA >60 04/03/2019 0951   GFRAA >60 04/23/2019 0936   GFRAA >60 04/03/2019 0951   Lab Results  Component Value Date   CHOL 203 (H) 07/22/2017   HDL 78.10 07/22/2017   LDLCALC 117 (H) 07/22/2017   LDLDIRECT 141.1 10/01/2013   TRIG 43.0 07/22/2017  CHOLHDL 3 07/22/2017   Lab Results  Component Value Date   HGBA1C 5.6 07/01/2018   Lab Results  Component Value Date   VITAMINB12 329 11/19/2018   Lab Results  Component Value Date   TSH 0.728 10/28/2017      ASSESSMENT AND PLAN 68 y.o. 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 Ph.D.   [facility] [place], [place] [address] Phone: [phone] Fax:      [phone]

## 2019-05-13 ENCOUNTER — Other Ambulatory Visit: Payer: Self-pay

## 2019-05-13 ENCOUNTER — Ambulatory Visit
Admission: RE | Admit: 2019-05-13 | Discharge: 2019-05-13 | Disposition: A | Discharge status: Home / Self Care | Payer: Federal, State, Local not specified - PPO | Source: Ambulatory Visit | Attending: Radiation Oncology | Admitting: Radiation Oncology

## 2019-05-13 DIAGNOSIS — C50411 Malignant neoplasm of upper-outer quadrant of right female breast: Secondary | ICD-10-CM | POA: Diagnosis not present

## 2019-05-14 ENCOUNTER — Ambulatory Visit: Payer: Federal, State, Local not specified - PPO

## 2019-05-14 ENCOUNTER — Other Ambulatory Visit: Payer: Federal, State, Local not specified - PPO

## 2019-05-14 ENCOUNTER — Ambulatory Visit: Payer: Federal, State, Local not specified - PPO | Admitting: Hematology

## 2019-05-14 ENCOUNTER — Other Ambulatory Visit: Payer: Self-pay

## 2019-05-14 ENCOUNTER — Ambulatory Visit
Admission: RE | Admit: 2019-05-14 | Discharge: 2019-05-14 | Disposition: A | Discharge status: Home / Self Care | Payer: Federal, State, Local not specified - PPO | Source: Ambulatory Visit | Attending: Radiation Oncology | Admitting: Radiation Oncology

## 2019-05-14 DIAGNOSIS — C50411 Malignant neoplasm of upper-outer quadrant of right female breast: Secondary | ICD-10-CM | POA: Diagnosis not present

## 2019-05-15 ENCOUNTER — Inpatient Hospital Stay: Payer: Federal, State, Local not specified - PPO

## 2019-05-15 ENCOUNTER — Other Ambulatory Visit: Payer: Self-pay

## 2019-05-15 ENCOUNTER — Encounter: Payer: Self-pay | Admitting: Hematology

## 2019-05-15 ENCOUNTER — Ambulatory Visit
Admission: RE | Admit: 2019-05-15 | Discharge: 2019-05-15 | Disposition: A | Discharge status: Home / Self Care | Payer: Federal, State, Local not specified - PPO | Source: Ambulatory Visit | Attending: Radiation Oncology | Admitting: Radiation Oncology

## 2019-05-15 ENCOUNTER — Inpatient Hospital Stay: Payer: Federal, State, Local not specified - PPO | Attending: Hematology

## 2019-05-15 ENCOUNTER — Inpatient Hospital Stay (HOSPITAL_BASED_OUTPATIENT_CLINIC_OR_DEPARTMENT_OTHER): Payer: Federal, State, Local not specified - PPO | Admitting: Hematology

## 2019-05-15 VITALS — BP 154/65 | HR 73 | Temp 98.8°F | Resp 16

## 2019-05-15 DIAGNOSIS — R252 Cramp and spasm: Secondary | ICD-10-CM | POA: Insufficient documentation

## 2019-05-15 DIAGNOSIS — M797 Fibromyalgia: Secondary | ICD-10-CM | POA: Insufficient documentation

## 2019-05-15 DIAGNOSIS — T451X5A Adverse effect of antineoplastic and immunosuppressive drugs, initial encounter: Secondary | ICD-10-CM | POA: Insufficient documentation

## 2019-05-15 DIAGNOSIS — C50411 Malignant neoplasm of upper-outer quadrant of right female breast: Secondary | ICD-10-CM | POA: Insufficient documentation

## 2019-05-15 DIAGNOSIS — G500 Trigeminal neuralgia: Secondary | ICD-10-CM | POA: Insufficient documentation

## 2019-05-15 DIAGNOSIS — Z170 Estrogen receptor positive status [ER+]: Secondary | ICD-10-CM

## 2019-05-15 DIAGNOSIS — G620 Drug-induced polyneuropathy: Secondary | ICD-10-CM | POA: Insufficient documentation

## 2019-05-15 DIAGNOSIS — Z5112 Encounter for antineoplastic immunotherapy: Secondary | ICD-10-CM | POA: Diagnosis not present

## 2019-05-15 DIAGNOSIS — Z9221 Personal history of antineoplastic chemotherapy: Secondary | ICD-10-CM | POA: Diagnosis not present

## 2019-05-15 DIAGNOSIS — D509 Iron deficiency anemia, unspecified: Secondary | ICD-10-CM | POA: Diagnosis not present

## 2019-05-15 DIAGNOSIS — Z923 Personal history of irradiation: Secondary | ICD-10-CM | POA: Diagnosis not present

## 2019-05-15 DIAGNOSIS — M1990 Unspecified osteoarthritis, unspecified site: Secondary | ICD-10-CM | POA: Insufficient documentation

## 2019-05-15 DIAGNOSIS — Z79899 Other long term (current) drug therapy: Secondary | ICD-10-CM | POA: Diagnosis not present

## 2019-05-15 DIAGNOSIS — Z95828 Presence of other vascular implants and grafts: Secondary | ICD-10-CM

## 2019-05-15 LAB — COMPREHENSIVE METABOLIC PANEL
ALT: 22 U/L (ref 0–44)
AST: 31 U/L (ref 15–41)
Albumin: 3.2 g/dL — ABNORMAL LOW (ref 3.5–5.0)
Alkaline Phosphatase: 88 U/L (ref 38–126)
Anion gap: 8 (ref 5–15)
BUN: 13 mg/dL (ref 8–23)
CO2: 27 mmol/L (ref 22–32)
Calcium: 8.7 mg/dL — ABNORMAL LOW (ref 8.9–10.3)
Chloride: 105 mmol/L (ref 98–111)
Creatinine, Ser: 0.73 mg/dL (ref 0.44–1.00)
GFR calc Af Amer: >60 mL/min (ref >60)
GFR calc non Af Amer: >60 mL/min (ref >60)
Glucose, Bld: 84 mg/dL (ref 70–99)
Potassium: 3.8 mmol/L (ref 3.5–5.1)
Sodium: 140 mmol/L (ref 135–145)
Total Bilirubin: 0.4 mg/dL (ref 0.3–1.2)
Total Protein: 6.6 g/dL (ref 6.5–8.1)

## 2019-05-15 LAB — CBC WITH DIFFERENTIAL/PLATELET
Abs Immature Granulocytes: 0.01 10*3/uL (ref 0.00–0.07)
Basophils Absolute: 0 10*3/uL (ref 0.0–0.1)
Basophils Relative: 1 %
Eosinophils Absolute: 0.1 10*3/uL (ref 0.0–0.5)
Eosinophils Relative: 1 %
HCT: 40.4 % (ref 36.0–46.0)
Hemoglobin: 12.8 g/dL (ref 12.0–15.0)
Immature Granulocytes: 0 %
Lymphocytes Relative: 30 %
Lymphs Abs: 1.5 10*3/uL (ref 0.7–4.0)
MCH: 29.8 pg (ref 26.0–34.0)
MCHC: 31.7 g/dL (ref 30.0–36.0)
MCV: 94 fL (ref 80.0–100.0)
Monocytes Absolute: 0.6 10*3/uL (ref 0.1–1.0)
Monocytes Relative: 12 %
Neutro Abs: 2.9 10*3/uL (ref 1.7–7.7)
Neutrophils Relative %: 56 %
Platelets: 158 10*3/uL (ref 150–400)
RBC: 4.3 MIL/uL (ref 3.87–5.11)
RDW: 14.6 % (ref 11.5–15.5)
WBC: 5.1 10*3/uL (ref 4.0–10.5)
nRBC: 0 % (ref 0.0–0.2)

## 2019-05-15 MED ORDER — SODIUM CHLORIDE 0.9 % IV SOLN
Freq: Once | INTRAVENOUS | Status: AC
  Administered 2019-05-15: 14:00:00 via INTRAVENOUS
  Filled 2019-05-15: qty 250

## 2019-05-15 MED ORDER — SODIUM CHLORIDE 0.9% FLUSH
10.0000 mL | Freq: Once | INTRAVENOUS | Status: AC
  Administered 2019-05-15: 10 mL
  Filled 2019-05-15: qty 10

## 2019-05-15 MED ORDER — DIPHENHYDRAMINE HCL 25 MG PO CAPS
50.0000 mg | ORAL_CAPSULE | Freq: Once | ORAL | Status: AC
  Administered 2019-05-15: 50 mg via ORAL

## 2019-05-15 MED ORDER — ACETAMINOPHEN 325 MG PO TABS
650.0000 mg | ORAL_TABLET | Freq: Once | ORAL | Status: AC
  Administered 2019-05-15: 650 mg via ORAL

## 2019-05-15 MED ORDER — SODIUM CHLORIDE 0.9% FLUSH
10.0000 mL | INTRAVENOUS | Status: DC | PRN
  Administered 2019-05-15: 10 mL
  Filled 2019-05-15: qty 10

## 2019-05-15 MED ORDER — DIPHENHYDRAMINE HCL 25 MG PO CAPS
ORAL_CAPSULE | ORAL | Status: AC
  Filled 2019-05-15: qty 2

## 2019-05-15 MED ORDER — HEPARIN SOD (PORK) LOCK FLUSH 100 UNIT/ML IV SOLN
500.0000 [IU] | Freq: Once | INTRAVENOUS | Status: AC | PRN
  Administered 2019-05-15: 500 [IU]
  Filled 2019-05-15: qty 5

## 2019-05-15 MED ORDER — TRASTUZUMAB-DKST CHEMO 150 MG IV SOLR
6.0000 mg/kg | Freq: Once | INTRAVENOUS | Status: AC
  Administered 2019-05-15: 504 mg via INTRAVENOUS
  Filled 2019-05-15: qty 24

## 2019-05-15 MED ORDER — ACETAMINOPHEN 325 MG PO TABS
ORAL_TABLET | ORAL | Status: AC
  Filled 2019-05-15: qty 2

## 2019-05-15 NOTE — Patient Instructions (Signed)
[facility] Discharge Instructions for Patients Receiving Chemotherapy  Today you received the following chemotherapy agents: Trastuzumab-dkst (Ogivri)  To help prevent nausea and vomiting after your treatment, we encourage you to take your nausea medication as directed.   If you develop nausea and vomiting that is not controlled by your nausea medication, call the clinic.   BELOW ARE SYMPTOMS THAT SHOULD BE REPORTED IMMEDIATELY:  *FEVER GREATER THAN 100.5 F  *CHILLS WITH OR WITHOUT FEVER  NAUSEA AND VOMITING THAT IS NOT CONTROLLED WITH YOUR NAUSEA MEDICATION  *UNUSUAL SHORTNESS OF BREATH  *UNUSUAL BRUISING OR BLEEDING  TENDERNESS IN MOUTH AND THROAT WITH OR WITHOUT PRESENCE OF ULCERS  *URINARY PROBLEMS  *BOWEL PROBLEMS  UNUSUAL RASH Items with * indicate a potential emergency and should be followed up as soon as possible.  Feel free to call the clinic should you have any questions or concerns. The clinic phone number is [phone].  Please show the CHEMO ALERT CARD at check-in to the Emergency Department and triage nurse.  Coronavirus (COVID-19) Are you at risk?  Are you at risk for the Coronavirus (COVID-19)?  To be considered HIGH RISK for Coronavirus (COVID-19), you have to meet the following criteria:  . Traveled to [place], [place], [place], [place] or [place]; or in the [place] to [place], [place], [place], or [place]; and have fever, cough, and shortness of breath within the last 2 weeks of travel OR . Been in close contact with a person diagnosed with COVID-19 within the last 2 weeks and have fever, cough, and shortness of breath . IF YOU DO NOT MEET THESE CRITERIA, YOU ARE CONSIDERED LOW RISK FOR COVID-19.  What to do if you are HIGH RISK for COVID-19?  . If you are having a medical emergency, call 911. . Seek medical care right away. Before you go to a doctor's office, urgent care or emergency department, call ahead and tell them  about your recent travel, contact with someone diagnosed with COVID-19, and your symptoms. You should receive instructions from your physician's office regarding next steps of care.  . When you arrive at healthcare provider, tell the healthcare staff immediately you have returned from visiting [place], [place], [place], [place] or [place]; or traveled in the [place] to [place], [place], [place], or [place]; in the last two weeks or you have been in close contact with a person diagnosed with COVID-19 in the last 2 weeks.   . Tell the health care staff about your symptoms: fever, cough and shortness of breath. . After you have been seen by a medical provider, you will be either: o Tested for (COVID-19) and discharged home on quarantine except to seek medical care if symptoms worsen, and asked to  - Stay home and avoid contact with others until you get your results (4-5 days)  - Avoid travel on public transportation if possible (such as bus, train, or airplane) or o Sent to the Emergency Department by EMS for evaluation, COVID-19 testing, and possible admission depending on your condition and test results.  What to do if you are LOW RISK for COVID-19?  Reduce your risk of any infection by using the same precautions used for avoiding the common cold or flu:  . Wash your hands often with soap and warm water for at least 20 seconds.  If soap and water are not readily available, use an alcohol-based hand sanitizer with at least 60% alcohol.  . If coughing or   sneezing, cover your mouth and nose by coughing or sneezing into the elbow areas of your shirt or coat, into a tissue or into your sleeve (not your hands). . Avoid shaking hands with others and consider head nods or verbal greetings only. . Avoid touching your eyes, nose, or mouth with unwashed hands.  . Avoid close contact with people who are sick. . Avoid places or events with large numbers of people in one location, like concerts or  sporting events. . Carefully consider travel plans you have or are making. . If you are planning any travel outside or inside the [place], visit the CDC's Travelers' Health webpage for the latest health notices. . If you have some symptoms but not all symptoms, continue to monitor at home and seek medical attention if your symptoms worsen. . If you are having a medical emergency, call 911.   [place] / e-Visit: [email or web address]         [facility]: [facility]: [phone]                   [facility]: [phone]

## 2019-05-19 ENCOUNTER — Ambulatory Visit
Admission: RE | Admit: 2019-05-19 | Discharge: 2019-05-19 | Disposition: A | Discharge status: Home / Self Care | Payer: Federal, State, Local not specified - PPO | Source: Ambulatory Visit | Attending: Radiation Oncology | Admitting: Radiation Oncology

## 2019-05-19 ENCOUNTER — Other Ambulatory Visit: Payer: Self-pay | Admitting: *Deleted

## 2019-05-19 DIAGNOSIS — C50411 Malignant neoplasm of upper-outer quadrant of right female breast: Secondary | ICD-10-CM | POA: Diagnosis not present

## 2019-05-20 ENCOUNTER — Other Ambulatory Visit: Payer: Self-pay

## 2019-05-20 ENCOUNTER — Telehealth: Payer: Self-pay | Admitting: Hematology

## 2019-05-20 ENCOUNTER — Encounter: Payer: Self-pay | Admitting: *Deleted

## 2019-05-20 ENCOUNTER — Ambulatory Visit: Payer: Federal, State, Local not specified - PPO

## 2019-05-20 ENCOUNTER — Ambulatory Visit
Admission: RE | Admit: 2019-05-20 | Discharge: 2019-05-20 | Disposition: A | Discharge status: Home / Self Care | Payer: Federal, State, Local not specified - PPO | Source: Ambulatory Visit | Attending: Radiation Oncology | Admitting: Radiation Oncology

## 2019-05-20 ENCOUNTER — Telehealth: Payer: Self-pay | Admitting: *Deleted

## 2019-05-20 ENCOUNTER — Other Ambulatory Visit: Payer: Self-pay | Admitting: *Deleted

## 2019-05-20 DIAGNOSIS — C50411 Malignant neoplasm of upper-outer quadrant of right female breast: Secondary | ICD-10-CM | POA: Diagnosis not present

## 2019-05-20 MED ORDER — GABAPENTIN 600 MG PO TABS: ORAL_TABLET | ORAL | 3 refills | Status: DC

## 2019-05-20 NOTE — Telephone Encounter (Signed)
She was seen in our office on 05/12/2019.  She was taking gabapentin 600mg , two tablets TID.  One additional 600mg  tablet was added at bedtime, if needed.  She has prescription coverage with [place] [phone]).  Her insurance plan denied a quantity override request for the additional tablet.  They stated the maximum dosage covered per day is 3600mg .  This issue has been discussed with Dr. Krista Blue.  She would like the patient to try the additional tablet at bedtime with her home supply.  If it is helpful, then she would have the option of using a goodrx.com coupon for the additional quantity. As of today's pricing on the website, the cost for 30 tablets of gabapentin 600mg  ranges depending of the pharmacy used.  The least expensive location was [facility] for $7.31 and the most expensive location was [facility] for $27.38.  However, pricing is subject to change.   I was unable to get in touch with the patient by phone.  I left her a message.  She is also active on mychart so I also communicated with her by this method as well.

## 2019-05-20 NOTE — Telephone Encounter (Signed)
Called and spoke with patient. Confirmed upcoming appts  

## 2019-05-21 ENCOUNTER — Other Ambulatory Visit: Payer: Self-pay

## 2019-05-21 ENCOUNTER — Ambulatory Visit: Payer: Federal, State, Local not specified - PPO

## 2019-05-21 ENCOUNTER — Encounter (HOSPITAL_COMMUNITY): Payer: Self-pay | Admitting: Cardiology

## 2019-05-21 ENCOUNTER — Ambulatory Visit
Admission: RE | Admit: 2019-05-21 | Discharge: 2019-05-21 | Disposition: A | Discharge status: Home / Self Care | Payer: Federal, State, Local not specified - PPO | Source: Ambulatory Visit | Attending: Radiation Oncology | Admitting: Radiation Oncology

## 2019-05-21 DIAGNOSIS — C50411 Malignant neoplasm of upper-outer quadrant of right female breast: Secondary | ICD-10-CM | POA: Diagnosis not present

## 2019-05-22 ENCOUNTER — Ambulatory Visit
Admission: RE | Admit: 2019-05-22 | Discharge: 2019-05-22 | Disposition: A | Discharge status: Home / Self Care | Payer: Federal, State, Local not specified - PPO | Source: Ambulatory Visit | Attending: Radiation Oncology | Admitting: Radiation Oncology

## 2019-05-22 ENCOUNTER — Other Ambulatory Visit: Payer: Self-pay

## 2019-05-22 DIAGNOSIS — C50411 Malignant neoplasm of upper-outer quadrant of right female breast: Secondary | ICD-10-CM | POA: Diagnosis not present

## 2019-05-25 ENCOUNTER — Ambulatory Visit
Admission: RE | Admit: 2019-05-25 | Discharge: 2019-05-25 | Disposition: A | Discharge status: Home / Self Care | Payer: Federal, State, Local not specified - PPO | Source: Ambulatory Visit | Attending: Radiation Oncology | Admitting: Radiation Oncology

## 2019-05-25 DIAGNOSIS — C50411 Malignant neoplasm of upper-outer quadrant of right female breast: Secondary | ICD-10-CM | POA: Diagnosis not present

## 2019-05-26 ENCOUNTER — Ambulatory Visit
Admission: RE | Admit: 2019-05-26 | Discharge: 2019-05-26 | Disposition: A | Discharge status: Home / Self Care | Payer: Federal, State, Local not specified - PPO | Source: Ambulatory Visit | Attending: Radiation Oncology | Admitting: Radiation Oncology

## 2019-05-26 ENCOUNTER — Other Ambulatory Visit: Payer: Self-pay

## 2019-05-26 DIAGNOSIS — Z170 Estrogen receptor positive status [ER+]: Secondary | ICD-10-CM

## 2019-05-26 DIAGNOSIS — C50411 Malignant neoplasm of upper-outer quadrant of right female breast: Secondary | ICD-10-CM | POA: Diagnosis not present

## 2019-05-26 MED ORDER — SONAFINE EX EMUL: 1.0000 "application " | Freq: Two times a day (BID) | CUTANEOUS | Status: DC

## 2019-05-27 ENCOUNTER — Ambulatory Visit: Payer: Federal, State, Local not specified - PPO

## 2019-05-27 ENCOUNTER — Ambulatory Visit
Admission: RE | Admit: 2019-05-27 | Discharge: 2019-05-27 | Disposition: A | Discharge status: Home / Self Care | Payer: Federal, State, Local not specified - PPO | Source: Ambulatory Visit | Attending: Radiation Oncology | Admitting: Radiation Oncology

## 2019-05-27 ENCOUNTER — Other Ambulatory Visit: Payer: Self-pay

## 2019-05-27 DIAGNOSIS — C50411 Malignant neoplasm of upper-outer quadrant of right female breast: Secondary | ICD-10-CM | POA: Diagnosis not present

## 2019-05-28 ENCOUNTER — Encounter: Payer: Self-pay | Admitting: Radiation Oncology

## 2019-05-28 ENCOUNTER — Ambulatory Visit
Admission: RE | Admit: 2019-05-28 | Discharge: 2019-05-28 | Disposition: A | Discharge status: Home / Self Care | Payer: Federal, State, Local not specified - PPO | Source: Ambulatory Visit | Attending: Radiation Oncology | Admitting: Radiation Oncology

## 2019-05-28 ENCOUNTER — Other Ambulatory Visit: Payer: Self-pay

## 2019-05-28 DIAGNOSIS — C50411 Malignant neoplasm of upper-outer quadrant of right female breast: Secondary | ICD-10-CM | POA: Diagnosis not present

## 2019-05-29 ENCOUNTER — Encounter: Payer: Self-pay | Admitting: *Deleted

## 2019-05-29 ENCOUNTER — Telehealth (HOSPITAL_COMMUNITY): Payer: Self-pay

## 2019-05-29 NOTE — Telephone Encounter (Signed)
New message    Just an FYI. We have made several attempts to contact this patient including sending a letter to schedule or reschedule their echocardiogram. We will be removing the patient from the echo WQ.   9.10.20 @ 11:54am mail reminder letter Summer [name]  9.1.20 @ 10:20am lm on home vm Summer [name] 8.26.20 @ 1:58pm lm on home vm - Summer [name]

## 2019-06-05 ENCOUNTER — Inpatient Hospital Stay: Payer: Federal, State, Local not specified - PPO

## 2019-06-05 ENCOUNTER — Other Ambulatory Visit: Payer: Self-pay

## 2019-06-05 VITALS — BP 134/67 | HR 69 | Temp 98.9°F | Resp 18

## 2019-06-05 DIAGNOSIS — Z170 Estrogen receptor positive status [ER+]: Secondary | ICD-10-CM

## 2019-06-05 DIAGNOSIS — C50411 Malignant neoplasm of upper-outer quadrant of right female breast: Secondary | ICD-10-CM | POA: Diagnosis not present

## 2019-06-05 DIAGNOSIS — D509 Iron deficiency anemia, unspecified: Secondary | ICD-10-CM

## 2019-06-05 LAB — CMP (CANCER CENTER ONLY)
ALT: 17 U/L (ref 0–44)
AST: 30 U/L (ref 15–41)
Albumin: 3.4 g/dL — ABNORMAL LOW (ref 3.5–5.0)
Alkaline Phosphatase: 103 U/L (ref 38–126)
Anion gap: 7 (ref 5–15)
BUN: 10 mg/dL (ref 8–23)
CO2: 28 mmol/L (ref 22–32)
Calcium: 9 mg/dL (ref 8.9–10.3)
Chloride: 106 mmol/L (ref 98–111)
Creatinine: 0.72 mg/dL (ref 0.44–1.00)
GFR, Est AFR Am: >60 mL/min (ref >60)
GFR, Estimated: >60 mL/min (ref >60)
Glucose, Bld: 87 mg/dL (ref 70–99)
Potassium: 3.9 mmol/L (ref 3.5–5.1)
Sodium: 141 mmol/L (ref 135–145)
Total Bilirubin: 0.4 mg/dL (ref 0.3–1.2)
Total Protein: 6.7 g/dL (ref 6.5–8.1)

## 2019-06-05 LAB — CBC WITH DIFFERENTIAL (CANCER CENTER ONLY)
Abs Immature Granulocytes: 0.01 10*3/uL (ref 0.00–0.07)
Basophils Absolute: 0 10*3/uL (ref 0.0–0.1)
Basophils Relative: 1 %
Eosinophils Absolute: 0.1 10*3/uL (ref 0.0–0.5)
Eosinophils Relative: 3 %
HCT: 42.1 % (ref 36.0–46.0)
Hemoglobin: 13.3 g/dL (ref 12.0–15.0)
Immature Granulocytes: 0 %
Lymphocytes Relative: 33 %
Lymphs Abs: 1.2 10*3/uL (ref 0.7–4.0)
MCH: 29.2 pg (ref 26.0–34.0)
MCHC: 31.6 g/dL (ref 30.0–36.0)
MCV: 92.5 fL (ref 80.0–100.0)
Monocytes Absolute: 0.4 10*3/uL (ref 0.1–1.0)
Monocytes Relative: 12 %
Neutro Abs: 1.8 10*3/uL (ref 1.7–7.7)
Neutrophils Relative %: 51 %
Platelet Count: 191 10*3/uL (ref 150–400)
RBC: 4.55 MIL/uL (ref 3.87–5.11)
RDW: 14 % (ref 11.5–15.5)
WBC Count: 3.6 10*3/uL — ABNORMAL LOW (ref 4.0–10.5)
nRBC: 0 % (ref 0.0–0.2)

## 2019-06-05 LAB — FERRITIN: Ferritin: 21 ng/mL (ref 11–307)

## 2019-06-05 MED ORDER — HEPARIN SOD (PORK) LOCK FLUSH 100 UNIT/ML IV SOLN
500.0000 [IU] | Freq: Once | INTRAVENOUS | Status: AC | PRN
  Administered 2019-06-05: 10:00:00 500 [IU]
  Filled 2019-06-05: qty 5

## 2019-06-05 MED ORDER — DIPHENHYDRAMINE HCL 25 MG PO CAPS
50.0000 mg | ORAL_CAPSULE | Freq: Once | ORAL | Status: AC
  Administered 2019-06-05: 09:00:00 50 mg via ORAL

## 2019-06-05 MED ORDER — ACETAMINOPHEN 325 MG PO TABS
ORAL_TABLET | ORAL | Status: AC
  Filled 2019-06-05: qty 2

## 2019-06-05 MED ORDER — ACETAMINOPHEN 325 MG PO TABS
650.0000 mg | ORAL_TABLET | Freq: Once | ORAL | Status: AC
  Administered 2019-06-05: 650 mg via ORAL

## 2019-06-05 MED ORDER — SODIUM CHLORIDE 0.9 % IV SOLN
Freq: Once | INTRAVENOUS | Status: AC
  Administered 2019-06-05: 09:00:00 via INTRAVENOUS
  Filled 2019-06-05: qty 250

## 2019-06-05 MED ORDER — TRASTUZUMAB-DKST CHEMO 150 MG IV SOLR
6.0000 mg/kg | Freq: Once | INTRAVENOUS | Status: AC
  Administered 2019-06-05: 10:00:00 504 mg via INTRAVENOUS
  Filled 2019-06-05: qty 24

## 2019-06-05 MED ORDER — SODIUM CHLORIDE 0.9% FLUSH
10.0000 mL | INTRAVENOUS | Status: DC | PRN
  Administered 2019-06-05: 10 mL
  Filled 2019-06-05: qty 10

## 2019-06-05 MED ORDER — DIPHENHYDRAMINE HCL 25 MG PO CAPS
ORAL_CAPSULE | ORAL | Status: AC
  Filled 2019-06-05: qty 2

## 2019-06-05 NOTE — Patient Instructions (Signed)
[facility] Discharge Instructions for Patients Receiving Chemotherapy  Today you received the following chemotherapy agents: Trastuzumab-dkst.  To help prevent nausea and vomiting after your treatment, we encourage you to take your nausea medication as directed.   If you develop nausea and vomiting that is not controlled by your nausea medication, call the clinic.   BELOW ARE SYMPTOMS THAT SHOULD BE REPORTED IMMEDIATELY:  *FEVER GREATER THAN 100.5 F  *CHILLS WITH OR WITHOUT FEVER  NAUSEA AND VOMITING THAT IS NOT CONTROLLED WITH YOUR NAUSEA MEDICATION  *UNUSUAL SHORTNESS OF BREATH  *UNUSUAL BRUISING OR BLEEDING  TENDERNESS IN MOUTH AND THROAT WITH OR WITHOUT PRESENCE OF ULCERS  *URINARY PROBLEMS  *BOWEL PROBLEMS  UNUSUAL RASH Items with * indicate a potential emergency and should be followed up as soon as possible.  Feel free to call the clinic should you have any questions or concerns. The clinic phone number is [phone].  Please show the [facility] at check-in to the Emergency Department and triage nurse.  Coronavirus (COVID-19) Are you at risk?  Are you at risk for the Coronavirus (COVID-19)?  To be considered HIGH RISK for Coronavirus (COVID-19), you have to meet the following criteria:  . Traveled to [place], [place], [place], [place] or [place]; or in the [place] to [place], [place], [place], or [place]; and have fever, cough, and shortness of breath within the last 2 weeks of travel OR . Been in close contact with a person diagnosed with COVID-19 within the last 2 weeks and have fever, cough, and shortness of breath . IF YOU DO NOT MEET THESE CRITERIA, YOU ARE CONSIDERED LOW RISK FOR COVID-19.  What to do if you are HIGH RISK for COVID-19?  Marland Kitchen If you are having a medical emergency, call 911. . Seek medical care right away. Before you go to a doctor's office, urgent care or emergency department, call ahead and tell them about  your recent travel, contact with someone diagnosed with COVID-19, and your symptoms. You should receive instructions from your physician's office regarding next steps of care.  . When you arrive at healthcare provider, tell the healthcare staff immediately you have returned from visiting [place], [place], [place], [place] or [place]; or traveled in the [place] to [place], [place], [place], or [place]; in the last two weeks or you have been in close contact with a person diagnosed with COVID-19 in the last 2 weeks.   . Tell the health care staff about your symptoms: fever, cough and shortness of breath. . After you have been seen by a medical provider, you will be either: o Tested for (COVID-19) and discharged home on quarantine except to seek medical care if symptoms worsen, and asked to  - Stay home and avoid contact with others until you get your results (4-5 days)  - Avoid travel on public transportation if possible (such as bus, train, or airplane) or o Sent to the Emergency Department by EMS for evaluation, COVID-19 testing, and possible admission depending on your condition and test results.  What to do if you are LOW RISK for COVID-19?  Reduce your risk of any infection by using the same precautions used for avoiding the common cold or flu:  Marland Kitchen Wash your hands often with soap and warm water for at least 20 seconds.  If soap and water are not readily available, use an alcohol-based hand sanitizer with at least 60% alcohol.  . If coughing or sneezing,  cover your mouth and nose by coughing or sneezing into the elbow areas of your shirt or coat, into a tissue or into your sleeve (not your hands). . Avoid shaking hands with others and consider head nods or verbal greetings only. . Avoid touching your eyes, nose, or mouth with unwashed hands.  . Avoid close contact with people who are sick. . Avoid places or events with large numbers of people in one location, like concerts or sporting  events. . Carefully consider travel plans you have or are making. . If you are planning any travel outside or inside the [place], visit the CDC's Travelers' Health webpage for the latest health notices. . If you have some symptoms but not all symptoms, continue to monitor at home and seek medical attention if your symptoms worsen. . If you are having a medical emergency, call 911.   [place] / e-Visit: [email or web address]         [facility]: [facility]: [phone]                   [facility]: [phone]

## 2019-06-11 ENCOUNTER — Telehealth: Payer: Self-pay | Admitting: Neurology

## 2019-06-11 NOTE — Telephone Encounter (Signed)
I called patient and LVM asking her to call back to reschedule her NCV/EMG.

## 2019-06-11 NOTE — Telephone Encounter (Signed)
-----   Message from Desmond Lope, RN sent at 06/11/2019  9:36 AM EDT ----- Do you mind contacting this patient to reschedule her NCV/EMG?  She was scheduled on 06/24/2019 but it is in a slot that Dr. Jannifer Franklin has reserved for his patients. She has been taken off the schedule for that day.  She sometimes will not answer her phone but she is active on mychart.  I really appreciate it.  I have multiple other reschedules I'm working on between patients, due to template change, so this very helpful.  Thank you!

## 2019-06-22 NOTE — Progress Notes (Signed)
  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 She experienced pain both to the skin and inside the breast, as well as some itching. She reported the pain was worse underneath the breast and nipple. She was not noted to have moist desquamation.   Plan: The patient will follow-up with radiation oncology in 1 month.  ________________________________________________   Blair Promise, PhD, MD  This document serves as a record of services personally performed by Gery Pray, MD. It was created on his behalf by Wilburn Mylar, a trained medical scribe. The creation of this record is based on the scribe's personal observations and the provider's statements to them. This document has been checked and approved by the attending provider.

## 2019-06-22 NOTE — Progress Notes (Signed)
[place]   Telephone:([phone]) [phone] [phone]   Clinic Follow up Note   Patient Care Team: Hoyt Koch, MD as PCP - General (Internal Medicine) Mauro Kaufmann, RN as Oncology Nurse Navigator Rockwell Germany, RN as Oncology Nurse Navigator Excell Seltzer, MD as Consulting Physician (General Surgery) Truitt Merle, MD as Consulting Physician (Hematology) Gery Pray, MD as Consulting Physician (Radiation Oncology)  Date of Service:  06/26/2019  CHIEF COMPLAINT: F/u of right breast cancer  SUMMARY OF ONCOLOGIC HISTORY: Oncology History Overview Note  Cancer Staging Malignant neoplasm of upper-outer quadrant of right breast in female, estrogen receptor positive (Summer [name]) Staging form: Breast, AJCC 8th Edition - Clinical stage from 11/10/2018: Stage IA (cT1b, cN0, cM0, G2, ER+, PR+, HER2+) - Signed by Truitt Merle, MD on 11/18/2018     Malignant neoplasm of upper-outer quadrant of right breast in female, estrogen receptor positive (Summer [name])  11/07/2018 Mammogram   Diagnostic 11/07/18 IMPRESSION: 1. 1 x 0.7 x 0.6 cm hypoechoic mass with distortion at the 9-9:30 position of the RIGHT breast 6 cm from the nipple with distortion in the UPPER-OUTER RIGHT breast, corresponding to the mammographic abnormality. One RIGHT axillary lymph node with borderline cortical thickness. Tissue sampling of the RIGHT breast mass and borderline RIGHT axillary lymph node recommended. ADDENDUM: Patient returned today for biopsy of a single borderline thickened lymph node in the RIGHT axilla. I could not reproduce a morphologically abnormal lymph node today, possibly resolved in the interval. Today, only normal appearing lymph nodes were identified in the RIGHT axilla. As such, the axillary ultrasound-guided biopsy was canceled.   11/10/2018 Cancer Staging   Staging form: Breast, AJCC 8th Edition - Clinical stage from 11/10/2018: Stage IA (cT1b, cN0, cM0, G2, ER+, PR+, HER2+) -  Signed by Truitt Merle, MD on 11/18/2018   11/11/2018 Initial Biopsy   Diagnosis 11/11/18 Breast, right, needle core biopsy, upper outer - 9:30 o'clock position - INVASIVE DUCTAL CARCINOMA, GRADE II/III. - SEE MICROSCOPIC DESCRIPTION.   11/11/2018 Receptors her2   Results: IMMUNOHISTOCHEMICAL AND MORPHOMETRIC ANALYSIS PERFORMED MANUALLY The tumor cells are POSITIVE for Her2 (3+). Estrogen Receptor: 95%, POSITIVE, STRONG STAINING INTENSITY Progesterone Receptor: 5%, POSITIVE, STRONG STAINING INTENSITY Proliferation Marker Ki67: 20%   11/17/2018 Initial Diagnosis   Malignant neoplasm of upper-outer quadrant of right breast in female, estrogen receptor positive (Summer [name])   11/28/2018 Surgery   RIGHT BREAST LUMPECTOMY WITH RADIOACTIVE SEED AND RIGHT AXILLARY SENTINEL LYMPH NODE BIOPSY by Dr. Excell Seltzer  11/28/18    11/28/2018 Pathology Results   Diagnosis 11/28/18  1. Breast, lumpectomy, Right w/seed - INVASIVE DUCTAL CARCINOMA, 1.6 CM, NOTTINGHAM GRADE 2 OF 3. - MARGINS OF RESECTION ARE NOT INVOLVED (CLOSEST MARGIN: LESS THAN 1 MM, ANTERIOR). - DUCTAL CARCINOMA IN SITU. - BIOPSY SITE CHANGES. - SEE ONCOLOGY TABLE. 2. Lymph node, sentinel, biopsy, right Axillary - ONE LYMPH NODE, NEGATIVE FOR CARCINOMA (0/1). 3. Lymph node, sentinel, biopsy, right Axillary - ONE LYMPH NODE, NEGATIVE FOR CARCINOMA (0/1).   11/28/2018 Cancer Staging   Staging form: Breast, AJCC 8th Edition - Pathologic stage from 11/28/2018: Stage IA (pT1c, pN0, cM0, G2, ER+, PR+, HER2+) - Signed by Truitt Merle, MD on 12/11/2018   12/22/2018 Procedure   Baseline ECHO 12/22/18  IMPRESSIONS  1. The left ventricle has normal systolic function with an ejection fraction of 60-65%. The cavity size was normal. There is mild concentric left ventricular hypertrophy. Left ventricular diastolic parameters were normal.  2. The right ventricle has normal systolic function. The cavity  was normal. There is no increase in right ventricular wall thickness.   3. Left atrial size was moderately dilated.  4. The aortic valve is tricuspid. Aortic valve regurgitation was not assessed by color flow Doppler.    01/02/2019 -  Chemotherapy   Adjuvant chemo weekly Taxol with Herceptin starting 01/02/19. Will d/c after 02/13/19 and will switch to Kadcyla q3weeks starting 02/20/19 due to worsening neuropathy. Due to worsening Muscle cramps Kadcyla stopped 04/03/19 and switched to Herceptin q3weeks on 04/23/19.    04/09/2019 -  Radiation Therapy   RT with Dr. Sondra Come 04/09/19-05/28/19   06/29/2019 -  Anti-estrogen oral therapy   Exemestane 93m daily starting 06/29/19      CURRENT THERAPY:  -Adjuvant chemo weekly Taxol with Herceptin starting 01/02/19. Will d/c after 02/13/19 and will switch to Kadcyla q3weeks starting 02/20/19 due to worsening neuropathy. Due to worsening Muscle cramps will switch Kadcyla to Herceptin on 04/23/19 -IV iron as needed -Exemestane 231mdaily starting 06/29/19  INTERVAL HISTORY:  Summer [name] here for a follow up and maintenance treatment. She presents to the clinic alone. She notes her cramping has been stable. She notes. Dr. YaKrista Blueanceled her f/u appointment after she was told to increase 1. She is now on Gabapentin 60086m times a day. This has not worked for her. She has rescheduled for 07/2019. She notes her numbness of feet (L>R) is still there. She feels like since stopping Kadcyla her neuropathy in her feet overall improved. She notes having arthritis diffusely.     REVIEW OF SYSTEMS:   Constitutional: Denies fevers, chills or abnormal weight loss Eyes: Denies blurriness of vision Ears, nose, mouth, throat, and face: Denies mucositis or sore throat Respiratory: Denies cough, dyspnea or wheezes Cardiovascular: Denies palpitation, chest discomfort or lower extremity swelling Gastrointestinal:  Denies nausea, heartburn or change in bowel habits Skin: Denies abnormal skin rashes Lymphatics: Denies new  lymphadenopathy or easy bruising Neurological: (+) Neuralgia of face (+) Numbness of feet, L>R (+) Tingling of fingertips  MSK: (+) Muscular cramps (+) Arthritis, mainly in her left knee and hip Behavioral/Psych: Mood is stable, no new changes  All other systems were reviewed with the patient and are negative.  MEDICAL HISTORY:  Past Medical History:  Diagnosis Date  . Abdominal pain, left lower quadrant 11/09/2009   Qualifier: Diagnosis of  By: NecValma CavaN, NanIzora Gala . Allergic rhinitis 08/26/2008   Qualifier: Diagnosis of  By: TodSherren Mocha, JefJory Ee. Anemia, iron deficiency 11/21/2018  . Arthritis shoulders and back  . BACK PAIN, CHRONIC 06/13/2007   Qualifier: Diagnosis of  By: TodSherren Mocha, JefJory Ee. 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 Constipation 07/01/2018  . Costochondritis 05/04/2014  . Diverticulitis of colon 06/13/2007   S/p segmental colectomy for perforated diverticulitis   . Diverticulosis   . Facial pain 10/06/2015  . Fibromyalgia   . Foot sprain, left, initial encounter 08/31/2016  . Glossitis 02/02/2016  . Headache 04/24/2016  . HEMATURIA UNSPECIFIED 06/25/2008   Qualifier: Diagnosis of  By: TodSherren Mocha, JefJory Ee. HEMATURIA, HX OF 11/14/2009   Qualifier: Diagnosis of  By: VerRegino SchultzeA (AAMA), RacApolonio Schneiders . History of kidney stones   . History of kidney stones   . History of shingles 2012--  no residual pain  . Itching 10/13/2018  . Left eye pain 07/01/2018  . Left ureteral calculus   .  LLQ pain 04/24/2016  . LOC OSTEOARTHROS NOT SPEC PRIM/SEC OTH [facility] SITE 06/27/2009   Qualifier: Diagnosis of  By: Sherren Mocha MD, Jory Ee   . Malignant neoplasm of upper-outer quadrant of right breast in female, estrogen receptor positive ([place]) 11/17/2018  . MCI (mild cognitive impairment)   . Memory loss 12/21/2016  . Morbid obesity ([place]) 11/09/2010  . Pain of right side of body 08/31/2016  . Pruritus 10/26/2016  . Right sided temporal headache  12/21/2016  . Tremor 12/21/2016  . Trigeminal neuralgia RIGHT  . Trigeminal neuralgia of right side of face 11/30/2010  . Trochanteric bursitis, right hip 10/26/2016  . Urgency of urination   . Weight loss 07/22/2017  . Zoster without complications 6/33/3545    SURGICAL HISTORY: Past Surgical History:  Procedure Laterality Date  . BREAST LUMPECTOMY WITH RADIOACTIVE SEED AND SENTINEL LYMPH NODE BIOPSY Right 11/28/2018   Procedure: RIGHT BREAST LUMPECTOMY WITH RADIOACTIVE SEED AND RIGHT AXILLARY SENTINEL LYMPH NODE BIOPSY;  Surgeon: Excell Seltzer, MD;  Location: [place];  Service: General;  Laterality: Right;  . CEREBRAL MICROVASCULAR DECOMPRESSION  07-05-2006   RIGHT TRIGEMINAL NERVE  . CYSTOSCOPY/RETROGRADE/URETEROSCOPY/STONE EXTRACTION WITH BASKET  07/04/2012   Procedure: CYSTOSCOPY/RETROGRADE/URETEROSCOPY/STONE EXTRACTION WITH BASKET;  Surgeon: Claybon Jabs, MD;  Location: [facility];  Service: Urology;  Laterality: Left;  . EXPLORATORY LAPAROTOMY/ RESECTION MID TO DISTAL SIGMOID AND PROXIMAL RECTUM/ END PROXIMAL SIGMOID COLOSTOMY  07-17-2006   PERFORATED DIVERTICULITIS WITH PERITONITIS  . gamma knife  05/17/2016   for trigeminal neuralgia, [facility], Dr Salomon Fick  . gamma knife  2019  . PORTACATH PLACEMENT Left 11/28/2018   Procedure: INSERTION PORT-A-CATH WITH ULTRASOUND;  Surgeon: Excell Seltzer, MD;  Location: [place];  Service: General;  Laterality: Left;  . RESECTION COLOSTOMY/ CLOSURE COLOSTOMY WITH COLOPROCTOSTOMY  02-20-2007  . TONSILLECTOMY  AS CHILD  . URETEROSCOPY  07/04/2012   Procedure: URETEROSCOPY;  Surgeon: Claybon Jabs, MD;  Location: [facility];  Service: Urology;  Laterality: Left;  Marland Kitchen VAGINAL HYSTERECTOMY  1998   Partial  . WISDOM TOOTH EXTRACTION      I have reviewed the social history and family history with the patient and they are unchanged from previous note.  ALLERGIES:  is allergic to clindamycin hcl.  MEDICATIONS:   Current Outpatient Medications  Medication Sig Dispense Refill  . acetaminophen (TYLENOL) 500 MG tablet Take 1,000 mg by mouth every 6 (six) hours as needed.    Marland Kitchen exemestane (AROMASIN) 25 MG tablet Take 1 tablet (25 mg total) by mouth daily after breakfast. 30 tablet 3  . gabapentin (NEURONTIN) 600 MG tablet TAKE 2 TABLETS TID. 540 tablet 3  . lidocaine-prilocaine (EMLA) cream Apply to affected area once 30 g 3  . triamcinolone ointment (KENALOG) 0.1 % Apply 1 application topically 3 (three) times daily. 90 g 0   No current facility-administered medications for this visit.     PHYSICAL EXAMINATION: ECOG PERFORMANCE STATUS: 1 - Symptomatic but completely ambulatory  Vitals:   06/26/19 0948  BP: (!) 149/60  Pulse: 76  Resp: 17  Temp: 98.3 F (36.8 C)  SpO2: 98%   Filed Weights   06/26/19 0948  Weight: 186 lb 4.8 oz (84.5 kg)    GENERAL:alert, no distress and comfortable SKIN: skin color, texture, turgor are normal, no rashes or significant lesions EYES: normal, Conjunctiva are pink and non-injected, sclera clear  NECK: supple, thyroid normal size, non-tender, without nodularity LYMPH:  no palpable lymphadenopathy in the cervical, axillary  LUNGS: clear to auscultation and percussion with normal breathing effort HEART: regular rate & rhythm and no murmurs and no lower extremity edema ABDOMEN:abdomen soft, non-tender and normal bowel sounds Musculoskeletal:no cyanosis of digits and no clubbing  NEURO: alert & oriented x 3 with fluent speech, no focal motor/sensory deficits BREAST: S/p right lumpectomy (+) skin breakdown from RT is healing well (+) Diffuse Skin hyperpigmentation. No palpable mass, nodules or adenopathy bilaterally. Breast exam benign.   LABORATORY DATA:  I have reviewed the data as listed CBC Latest Ref Rng & Units 06/26/2019 06/05/2019 05/15/2019  WBC 4.0 - 10.5 K/uL 4.3 3.6(L) 5.1  Hemoglobin 12.0 - 15.0 g/dL 12.8 13.3 12.8  Hematocrit 36.0 - 46.0 % 40.6 42.1  40.4  Platelets 150 - 400 K/uL 198 191 158     CMP Latest Ref Rng & Units 06/26/2019 06/05/2019 05/15/2019  Glucose 70 - 99 mg/dL 89 87 84  BUN 8 - 23 mg/dL 15 10 13   Creatinine 0.44 - 1.00 mg/dL 0.71 0.72 0.73  Sodium 135 - 145 mmol/L 143 141 140  Potassium 3.5 - 5.1 mmol/L 4.0 3.9 3.8  Chloride 98 - 111 mmol/L 107 106 105  CO2 22 - 32 mmol/L 28 28 27   Calcium 8.9 - 10.3 mg/dL 8.7(L) 9.0 8.7(L)  Total Protein 6.5 - 8.1 g/dL 6.4(L) 6.7 6.6  Total Bilirubin 0.3 - 1.2 mg/dL 0.3 0.4 0.4  Alkaline Phos 38 - 126 U/L 96 103 88  AST 15 - 41 U/L 29 30 31   ALT 0 - 44 U/L 20 17 22       RADIOGRAPHIC STUDIES: I have personally reviewed the radiological images as listed and agreed with the findings in the report. No results found.   ASSESSMENT & PLAN:  Summer [name] is a 68 y.o. female with   1.Malignant neoplasm of upper-outer quadrant of right breast, StageIA,pT1b,N0,M0, ER/PR+, HER2+, GradeII -She was recently diagnosed in 11/2018. She is s/p right breastlumpectomyand SLNB as of 11/28/18.  -Herpathology results showed her small grade II tumor was completely resected with negative margins and node negative. -She startedadjuvantweekly Taxol with Herceptinon4/24/20butstopped after 6 weeks therapydue to worsening neuropathyand infusion reaction. -Adjuvant chemo changed toTDM-1(Kadcyla)q3weeksstarting6/12/20. If she tolerates we will continueanti-her2 treatmentuntil 12/2019. I will likelyswitchto maintenanceHerceptinlater in her treatment orsooner if she has poor toleranceof Kadcyla.  -She startedRT with Dr. Sondra Come on 04/09/19 and plan to complete on 05/28/19.She is tolerating moderately well with skin change and soreness.  -Given her significant worsening muscle cramps stopped Kadcyla and restarted her Herceptin on 04/23/19.  -Last ECHO EF was mildly lower but still in normal range on 05/01/19 (from 60-65% to 55-60%). I will ask Dr. Radford Pax to review it and may  repeat her echo sooner than 3 months.  -She has tolerating Herceptin with improved neuropathy. Her Muscle cramping is stable.  -I discussed since she has completed RT, I recommend her to start antiestrogen therapy to reduce her risk of distant recurrence. Given her ER/PR positive postmenopausal status and arthritis, I recommend Exemestane for 5-7 years.    -The potential benefit and side effects, which includes but not limited to, hot flash, skin and vaginal dryness, metabolic changes ( increased blood glucose, cholesterol, weight, etc.), slightly in increased risk of cardiovascular disease, cataracts, muscular and joint discomfort, osteopenia and osteoporosis, etc, were discussed with her in great details. I gave her a print out of this medication. She is interested and agrees to start  -will get a baseline bone density scan  -If she  does not tolerate Exemestane will switch to Tamoxifen or another AI.  -Labs reviewed, CBC and CMP WNL except Ca 8.7, albumin 3.2. Overall adequate to proceed withHerceptin today. -continue Herceptin every 3 weeks  -f/u in 9 weeks   2. Significant Muscle Crampsand body pain -Worsened lately, now diffuse across body, especially in her legs and feet.  -I advised her to increase her water intake and I also recommend OTC Magnesium, potassium supplements and increase calcium in diet.  -She has not felt relief with Robaxin, OTC Tylenol and Ibuprofen. On 04/23/19 I previously called in Tramadol 47m q6-8hours as needed. I do not plan to refill.  -I discussed Kadcyla is the only new medication she is on, it is likely her pain is related to it. I switched her to Herceptin on 04/23/19.  -She notes her muscle cramping has worsened lately. She has been seen by her neurologist Dr YKrista Bluewho increased her Gabapentin to 6022m7 tabs a day. This is still not controlling all her pain muscular cramps. Her nerve study appointment was rescheduled for 07/29/2019.   3. Anemia,iron  deficiency -Pt notes she has been anemic in the past year -Last colonoscopy was in 04/2012 which wasunremarkable except diverticulosis.  -She had partial colectomy in the past due to severe diverticulosis. -This is possiblyiron deficiency from diverticulosis.She denies any current bleeding.  -After COVID-19 improvesand her chemo treatmentI will refer herbacktoDr. Gessnerfor colonoscopy -Her workup showed iron 17, ferritin 4, normal retic ct, folate Vitamin B12 and SPEP panels. -IV iron was given 3/18/20and 12/12/18.  -Has been resolved lately   4. Trigeminal neuralgia  -onGabapentin -She has been having moderate right side Has which I suspect is exacerbation of her neuralgia secondary to chemo. Will monitor.  -She has oxycodone which she can use for significant pain.  -5/26/20brain and neck MRIwere stable. -Has not been completely controlled but managing with occasionally worsened flares.  -With this, neuropathy and recent muscle cramping she is now Gabapentin 60035m tabs a day.   5.PeripheralNeuropathy, G1-2 -Secondary to chemoTaxolstarted aftercycle 3. -I discussed neuropathy can progress with more treatment. Will monitor  -PA Vannpreviouslystarted her on Robaxin. She can take Tylenol in between for breakthrough pain.Continue -Taxol switched toTDM-1 Kadcylaon 02/20/19 which was switched to Herceptin on 04/23/19 -Her neuropathyhas overall improved in her feet, with still has significant numbness of 2 left toes  and tinglingin her fingertips. -Continue B12 complex. I also encouraged her to exercise. -With this, neuralgia and recent muscle cramping her Gabapentin was increased to 600m61mtabs a day by Dr.Atanacio Melnyk    PLAN: -Send message to her Dr. TurnRadford Pax f/u Echo -I called in Exemestane to start when she receives it.  -Labs reviewed andadequateto proceed withHerceptintoday -Lab, flush, andHerceptinin 3, 6 and 9 weeks -F/u in 9 weeks    No  problem-specific Assessment & Plan notes found for this encounter.   Orders Placed This Encounter  Procedures  . DG Bone Density    Standing Status:   Future    Standing Expiration Date:   06/25/2020    Order Specific Question:   Reason for Exam (SYMPTOM  OR DIAGNOSIS REQUIRED)    Answer:   screening    Order Specific Question:   [facility]    [facility] The patient knows to call the clinic with any problems, questions or concerns. No barriers to learning was detected. I spent 20 minutes counseling the patient face to face.  The total time spent in the appointment was 25 minutes and more than 50% was on counseling and review of test results     Truitt Merle, MD 06/26/2019   I, Joslyn Devon, am acting as scribe for Truitt Merle, MD.   I have reviewed the above documentation for accuracy and completeness, and I agree with the above.

## 2019-06-24 ENCOUNTER — Encounter: Payer: Federal, State, Local not specified - PPO | Admitting: Neurology

## 2019-06-25 ENCOUNTER — Encounter: Payer: Federal, State, Local not specified - PPO | Admitting: Neurology

## 2019-06-26 ENCOUNTER — Inpatient Hospital Stay: Payer: Federal, State, Local not specified - PPO

## 2019-06-26 ENCOUNTER — Inpatient Hospital Stay: Payer: Federal, State, Local not specified - PPO | Attending: Hematology | Admitting: Hematology

## 2019-06-26 ENCOUNTER — Other Ambulatory Visit: Payer: Self-pay

## 2019-06-26 ENCOUNTER — Encounter: Payer: Self-pay | Admitting: Hematology

## 2019-06-26 VITALS — BP 149/60 | HR 76 | Temp 98.3°F | Resp 17 | Ht 66.0 in | Wt 186.3 lb

## 2019-06-26 DIAGNOSIS — Z79811 Long term (current) use of aromatase inhibitors: Secondary | ICD-10-CM | POA: Insufficient documentation

## 2019-06-26 DIAGNOSIS — C50411 Malignant neoplasm of upper-outer quadrant of right female breast: Secondary | ICD-10-CM

## 2019-06-26 DIAGNOSIS — Z5112 Encounter for antineoplastic immunotherapy: Secondary | ICD-10-CM | POA: Diagnosis not present

## 2019-06-26 DIAGNOSIS — Z95828 Presence of other vascular implants and grafts: Secondary | ICD-10-CM

## 2019-06-26 DIAGNOSIS — Z87442 Personal history of urinary calculi: Secondary | ICD-10-CM | POA: Diagnosis not present

## 2019-06-26 DIAGNOSIS — Z79899 Other long term (current) drug therapy: Secondary | ICD-10-CM | POA: Insufficient documentation

## 2019-06-26 DIAGNOSIS — E2839 Other primary ovarian failure: Secondary | ICD-10-CM | POA: Diagnosis not present

## 2019-06-26 DIAGNOSIS — D509 Iron deficiency anemia, unspecified: Secondary | ICD-10-CM | POA: Insufficient documentation

## 2019-06-26 DIAGNOSIS — Z170 Estrogen receptor positive status [ER+]: Secondary | ICD-10-CM

## 2019-06-26 DIAGNOSIS — M1990 Unspecified osteoarthritis, unspecified site: Secondary | ICD-10-CM | POA: Diagnosis not present

## 2019-06-26 DIAGNOSIS — G620 Drug-induced polyneuropathy: Secondary | ICD-10-CM | POA: Insufficient documentation

## 2019-06-26 DIAGNOSIS — T451X5A Adverse effect of antineoplastic and immunosuppressive drugs, initial encounter: Secondary | ICD-10-CM | POA: Diagnosis not present

## 2019-06-26 LAB — CMP (CANCER CENTER ONLY)
ALT: 20 U/L (ref 0–44)
AST: 29 U/L (ref 15–41)
Albumin: 3.2 g/dL — ABNORMAL LOW (ref 3.5–5.0)
Alkaline Phosphatase: 96 U/L (ref 38–126)
Anion gap: 8 (ref 5–15)
BUN: 15 mg/dL (ref 8–23)
CO2: 28 mmol/L (ref 22–32)
Calcium: 8.7 mg/dL — ABNORMAL LOW (ref 8.9–10.3)
Chloride: 107 mmol/L (ref 98–111)
Creatinine: 0.71 mg/dL (ref 0.44–1.00)
GFR, Est AFR Am: >60 mL/min (ref >60)
GFR, Estimated: >60 mL/min (ref >60)
Glucose, Bld: 89 mg/dL (ref 70–99)
Potassium: 4 mmol/L (ref 3.5–5.1)
Sodium: 143 mmol/L (ref 135–145)
Total Bilirubin: 0.3 mg/dL (ref 0.3–1.2)
Total Protein: 6.4 g/dL — ABNORMAL LOW (ref 6.5–8.1)

## 2019-06-26 LAB — CBC WITH DIFFERENTIAL (CANCER CENTER ONLY)
Abs Immature Granulocytes: 0.02 10*3/uL (ref 0.00–0.07)
Basophils Absolute: 0.1 10*3/uL (ref 0.0–0.1)
Basophils Relative: 1 %
Eosinophils Absolute: 0.1 10*3/uL (ref 0.0–0.5)
Eosinophils Relative: 3 %
HCT: 40.6 % (ref 36.0–46.0)
Hemoglobin: 12.8 g/dL (ref 12.0–15.0)
Immature Granulocytes: 1 %
Lymphocytes Relative: 31 %
Lymphs Abs: 1.3 10*3/uL (ref 0.7–4.0)
MCH: 28.8 pg (ref 26.0–34.0)
MCHC: 31.5 g/dL (ref 30.0–36.0)
MCV: 91.4 fL (ref 80.0–100.0)
Monocytes Absolute: 0.4 10*3/uL (ref 0.1–1.0)
Monocytes Relative: 9 %
Neutro Abs: 2.4 10*3/uL (ref 1.7–7.7)
Neutrophils Relative %: 55 %
Platelet Count: 198 10*3/uL (ref 150–400)
RBC: 4.44 MIL/uL (ref 3.87–5.11)
RDW: 13.7 % (ref 11.5–15.5)
WBC Count: 4.3 10*3/uL (ref 4.0–10.5)
nRBC: 0 % (ref 0.0–0.2)

## 2019-06-26 MED ORDER — EXEMESTANE 25 MG PO TABS: 25.0000 mg | ORAL_TABLET | Freq: Every day | ORAL | 3 refills | Status: DC

## 2019-06-26 MED ORDER — TRASTUZUMAB-DKST CHEMO 150 MG IV SOLR
6.0000 mg/kg | Freq: Once | INTRAVENOUS | Status: AC
  Administered 2019-06-26: 504 mg via INTRAVENOUS
  Filled 2019-06-26: qty 24

## 2019-06-26 MED ORDER — HEPARIN SOD (PORK) LOCK FLUSH 100 UNIT/ML IV SOLN
500.0000 [IU] | Freq: Once | INTRAVENOUS | Status: AC | PRN
  Administered 2019-06-26: 500 [IU]
  Filled 2019-06-26: qty 5

## 2019-06-26 MED ORDER — DIPHENHYDRAMINE HCL 25 MG PO CAPS
50.0000 mg | ORAL_CAPSULE | Freq: Once | ORAL | Status: AC
  Administered 2019-06-26: 50 mg via ORAL

## 2019-06-26 MED ORDER — SODIUM CHLORIDE 0.9% FLUSH
10.0000 mL | Freq: Once | INTRAVENOUS | Status: AC
  Administered 2019-06-26: 10 mL
  Filled 2019-06-26: qty 10

## 2019-06-26 MED ORDER — ACETAMINOPHEN 325 MG PO TABS
ORAL_TABLET | ORAL | Status: AC
  Filled 2019-06-26: qty 2

## 2019-06-26 MED ORDER — DIPHENHYDRAMINE HCL 25 MG PO CAPS
ORAL_CAPSULE | ORAL | Status: AC
  Filled 2019-06-26: qty 2

## 2019-06-26 MED ORDER — ACETAMINOPHEN 325 MG PO TABS
650.0000 mg | ORAL_TABLET | Freq: Once | ORAL | Status: AC
  Administered 2019-06-26: 650 mg via ORAL

## 2019-06-26 MED ORDER — SODIUM CHLORIDE 0.9% FLUSH
10.0000 mL | INTRAVENOUS | Status: DC | PRN
  Administered 2019-06-26: 10 mL
  Filled 2019-06-26: qty 10

## 2019-06-26 MED ORDER — SODIUM CHLORIDE 0.9 % IV SOLN
Freq: Once | INTRAVENOUS | Status: AC
  Administered 2019-06-26: 11:00:00 via INTRAVENOUS
  Filled 2019-06-26: qty 250

## 2019-06-26 NOTE — Patient Instructions (Signed)
[facility] Discharge Instructions for Patients Receiving Chemotherapy  Today you received the following chemotherapy agents trastuzumab.  To help prevent nausea and vomiting after your treatment, we encourage you to take your nausea medication as directed.    If you develop nausea and vomiting that is not controlled by your nausea medication, call the clinic.   BELOW ARE SYMPTOMS THAT SHOULD BE REPORTED IMMEDIATELY:  *FEVER GREATER THAN 100.5 F  *CHILLS WITH OR WITHOUT FEVER  NAUSEA AND VOMITING THAT IS NOT CONTROLLED WITH YOUR NAUSEA MEDICATION  *UNUSUAL SHORTNESS OF BREATH  *UNUSUAL BRUISING OR BLEEDING  TENDERNESS IN MOUTH AND THROAT WITH OR WITHOUT PRESENCE OF ULCERS  *URINARY PROBLEMS  *BOWEL PROBLEMS  UNUSUAL RASH Items with * indicate a potential emergency and should be followed up as soon as possible.  Feel free to call the clinic should you have any questions or concerns. The clinic phone number is [phone].  Please show the CHEMO ALERT CARD at check-in to the Emergency Department and triage nurse.   

## 2019-06-29 ENCOUNTER — Telehealth: Payer: Self-pay | Admitting: Hematology

## 2019-06-29 NOTE — Telephone Encounter (Signed)
Scheduled appt per 10/16 los.  Patient will get a printout at her next scheduled appt.

## 2019-07-02 ENCOUNTER — Encounter: Payer: Self-pay | Admitting: Hematology

## 2019-07-02 ENCOUNTER — Telehealth: Payer: Self-pay

## 2019-07-02 NOTE — Telephone Encounter (Signed)
I called the patient and she will reach out to Dr Burr Medico about whether or not she wants a repeat Echo prior to her Virtual Visit with Dr Radford Pax in December.

## 2019-07-03 ENCOUNTER — Other Ambulatory Visit: Payer: Self-pay

## 2019-07-03 DIAGNOSIS — Z170 Estrogen receptor positive status [ER+]: Secondary | ICD-10-CM

## 2019-07-03 DIAGNOSIS — C50411 Malignant neoplasm of upper-outer quadrant of right female breast: Secondary | ICD-10-CM

## 2019-07-06 ENCOUNTER — Other Ambulatory Visit: Payer: Self-pay

## 2019-07-06 ENCOUNTER — Encounter: Payer: Self-pay | Admitting: Hematology

## 2019-07-06 DIAGNOSIS — Z170 Estrogen receptor positive status [ER+]: Secondary | ICD-10-CM

## 2019-07-06 DIAGNOSIS — C50411 Malignant neoplasm of upper-outer quadrant of right female breast: Secondary | ICD-10-CM

## 2019-07-08 ENCOUNTER — Encounter: Payer: Self-pay | Admitting: *Deleted

## 2019-07-09 ENCOUNTER — Telehealth: Payer: Self-pay | Admitting: *Deleted

## 2019-07-09 NOTE — Telephone Encounter (Signed)
Email from patient:  [employer] refused to pay for my prescription for gabapentin for unknown reason.  However I believe it is the way and/or it is written since you included an extra pill per day for cramping. Can they be contacted for approval or do we need another med to replace.  Down to last few pills. Help!   I spoke to the patient and she would like to stay on her current dose of gabapentin 600mg , two tabs TID.  I have called the pharmacy and this dosage is covered by her insurance. I spoke to [place] and she was able to process the prescription with a zero dollar co-pay.  [employer] will not pay for any additional quantities.  We had discussed her using a goodrx.com coupon for the extra 30 tablets. The patient would rather wait until after her NCV/EMG on 07/29/2019 to discuss further medication changes.

## 2019-07-10 ENCOUNTER — Telehealth: Payer: Self-pay | Admitting: *Deleted

## 2019-07-10 NOTE — Telephone Encounter (Signed)
CALLED PATIENT TO INFORM OF FU WITH DR. KINARD ON 07-16-19 @ 11AM, LVM FOR A RETURN CALL

## 2019-07-14 ENCOUNTER — Other Ambulatory Visit: Payer: Self-pay

## 2019-07-14 ENCOUNTER — Ambulatory Visit (HOSPITAL_COMMUNITY): Payer: Federal, State, Local not specified - PPO | Attending: Cardiology

## 2019-07-14 DIAGNOSIS — C50411 Malignant neoplasm of upper-outer quadrant of right female breast: Secondary | ICD-10-CM | POA: Diagnosis present

## 2019-07-14 DIAGNOSIS — Z170 Estrogen receptor positive status [ER+]: Secondary | ICD-10-CM | POA: Diagnosis not present

## 2019-07-16 ENCOUNTER — Encounter: Payer: Self-pay | Admitting: Radiation Oncology

## 2019-07-16 ENCOUNTER — Other Ambulatory Visit: Payer: Self-pay

## 2019-07-16 ENCOUNTER — Ambulatory Visit
Admission: RE | Admit: 2019-07-16 | Discharge: 2019-07-16 | Disposition: A | Discharge status: Home / Self Care | Payer: Federal, State, Local not specified - PPO | Source: Ambulatory Visit | Attending: Radiation Oncology | Admitting: Radiation Oncology

## 2019-07-16 VITALS — BP 128/64 | HR 79 | Temp 98.0°F | Resp 18 | Ht 66.0 in | Wt 187.4 lb

## 2019-07-16 DIAGNOSIS — C50411 Malignant neoplasm of upper-outer quadrant of right female breast: Secondary | ICD-10-CM | POA: Diagnosis present

## 2019-07-16 DIAGNOSIS — Z79899 Other long term (current) drug therapy: Secondary | ICD-10-CM | POA: Insufficient documentation

## 2019-07-16 DIAGNOSIS — Z79811 Long term (current) use of aromatase inhibitors: Secondary | ICD-10-CM | POA: Diagnosis not present

## 2019-07-16 DIAGNOSIS — Z923 Personal history of irradiation: Secondary | ICD-10-CM | POA: Diagnosis not present

## 2019-07-16 DIAGNOSIS — Z170 Estrogen receptor positive status [ER+]: Secondary | ICD-10-CM | POA: Diagnosis not present

## 2019-07-16 NOTE — Progress Notes (Signed)
Summer [name] presents today for f/u with Dr. Sondra Come. Pt reports fatigue from radiation has resolved. Pt denies c/o pain, but does report RIGHT breast is "tight". Pt does not use any moisturizer on breast. Breast is hyperpigmented and swollen.  BP 128/64 (BP Location: Left Arm, Patient Position: Sitting)   Pulse 79   Temp 98 F (36.7 C) (Temporal)   Resp 18   Ht 5\' 6"  (1.676 m)   Wt 187 lb 6 oz (85 kg)   SpO2 99%   BMI 30.24 kg/m   Wt Readings from Last 3 Encounters:  07/16/19 187 lb 6 oz (85 kg)  06/26/19 186 lb 4.8 oz (84.5 kg)  05/12/19 182 lb 8 oz (82.8 kg)   Loma Sousa, RN BSN

## 2019-07-16 NOTE — Patient Instructions (Signed)
Coronavirus (COVID-19) Are you at risk?  Are you at risk for the Coronavirus (COVID-19)?  To be considered HIGH RISK for Coronavirus (COVID-19), you have to meet the following criteria:  . Traveled to [place], [place], [place], [place] or [place]; or in the [place] to [place], [place], [place], or [place]; and have fever, cough, and shortness of breath within the last 2 weeks of travel OR . Been in close contact with a person diagnosed with COVID-19 within the last 2 weeks and have fever, cough, and shortness of breath . IF YOU DO NOT MEET THESE CRITERIA, YOU ARE CONSIDERED LOW RISK FOR COVID-19.  What to do if you are HIGH RISK for COVID-19?  . If you are having a medical emergency, call 911. . Seek medical care right away. Before you go to a doctor's office, urgent care or emergency department, call ahead and tell them about your recent travel, contact with someone diagnosed with COVID-19, and your symptoms. You should receive instructions from your physician's office regarding next steps of care.  . When you arrive at healthcare provider, tell the healthcare staff immediately you have returned from visiting [place], [place], [place], [place] or [place]; or traveled in the [place] to [place], [place], [place], or [place]; in the last two weeks or you have been in close contact with a person diagnosed with COVID-19 in the last 2 weeks.   . Tell the health care staff about your symptoms: fever, cough and shortness of breath. . After you have been seen by a medical provider, you will be either: o Tested for (COVID-19) and discharged home on quarantine except to seek medical care if symptoms worsen, and asked to  - Stay home and avoid contact with others until you get your results (4-5 days)  - Avoid travel on public transportation if possible (such as bus, train, or airplane) or o Sent to the Emergency Department by EMS for evaluation, COVID-19 testing, and possible  admission depending on your condition and test results.  What to do if you are LOW RISK for COVID-19?  Reduce your risk of any infection by using the same precautions used for avoiding the common cold or flu:  . Wash your hands often with soap and warm water for at least 20 seconds.  If soap and water are not readily available, use an alcohol-based hand sanitizer with at least 60% alcohol.  . If coughing or sneezing, cover your mouth and nose by coughing or sneezing into the elbow areas of your shirt or coat, into a tissue or into your sleeve (not your hands). . Avoid shaking hands with others and consider head nods or verbal greetings only. . Avoid touching your eyes, nose, or mouth with unwashed hands.  . Avoid close contact with people who are sick. . Avoid places or events with large numbers of people in one location, like concerts or sporting events. . Carefully consider travel plans you have or are making. . If you are planning any travel outside or inside the [place], visit the CDC's Travelers' Health webpage for the latest health notices. . If you have some symptoms but not all symptoms, continue to monitor at home and seek medical attention if your symptoms worsen. . If you are having a medical emergency, call 911.   ADDITIONAL HEALTHCARE OPTIONS FOR PATIENTS  [facility] / e-Visit: [email or web address]         [facility]: [phone]  [place]   [facility]: [phone]                   [facility]: [phone]   

## 2019-07-16 NOTE — Progress Notes (Signed)
Radiation Oncology         [phone]) [phone] ________________________________  Name: Summer [name] MRN: [id number]  Date: 07/16/2019  DOB: January 07, 1951  Follow-Up Visit Note  CC: Hoyt Koch, MD  Hoyt Koch, *    ICD-10-CM   1. 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 She continues follow up with Dr. Burr Medico and was started on exemestane on 06/27/2019.  On review of systems, she reports her radiation-induced fatigue has resolved since completion. She denies pain but notes her right breast is "tight." She denies using any moisturizer on the breast.  Of note, she is scheduled for bone density testing tomorrow, 07/17/2019.  ALLERGIES:  is allergic to clindamycin hcl.  Meds: Current Outpatient Medications  Medication Sig Dispense Refill  . acetaminophen (TYLENOL) 500 MG tablet Take 1,000 mg by mouth every 6 (six) hours as needed.    Marland Kitchen exemestane (AROMASIN) 25 MG tablet Take 1 tablet (25 mg total) by mouth daily after breakfast. 30 tablet 3  . gabapentin (NEURONTIN) 600 MG tablet TAKE 2 TABLETS TID. 540 tablet 3  . lidocaine-prilocaine (EMLA) cream Apply to affected area once (Patient not taking: Reported on 07/16/2019) 30 g 3  . triamcinolone ointment (KENALOG) 0.1 % Apply 1 application topically 3 (three) times daily. (Patient not taking: Reported on 07/16/2019) 90 g 0   No current facility-administered medications for this encounter.     Physical Findings: The patient is in no  acute distress. Patient is alert and oriented.  height is '5\' 6"'$  (1.676 m) and weight is 187 lb 6 oz (85 kg). Her temporal temperature is 98 F (36.7 C). Her blood pressure is 128/64 and her pulse is 79. Her respiration is 18 and oxygen saturation is 99%. .  No significant changes. Lungs are clear to auscultation bilaterally. Heart has regular rate and rhythm. No palpable cervical, supraclavicular, or axillary adenopathy. Abdomen soft, non-tender, normal bowel sounds. The right breast area has healed well without palpable or visible signs of recurrence.  Diffuse hyperpigmentation changes noted in the right breast and some edema..  The edema does not seem to be bothering her.  Both breasts are very large and pendulous  Lab Findings: Lab Results  Component Value Date   WBC 4.3 06/26/2019   HGB 12.8 06/26/2019   HCT 40.6 06/26/2019   MCV 91.4 06/26/2019   PLT 198 06/26/2019    Radiographic Findings: No results found.  Impression:  The patient is recovering from the effects of radiation.  No evidence of recurrence on clinical exam.  We discussed consideration for physical therapy concerning her mild breast lymphedema but since she is asymptomatic she does not wish to pursue this issue  Plan: As needed follow-up with radiation oncology.  Patient will continue close follow-up in medical oncology.  ____________________________________ Gery Pray, MD   This document serves as a record of services personally performed by Gery Pray, MD. It was created on his behalf by Wilburn Mylar, a trained medical  scribe. The creation of this record is based on the scribe's personal observations and the provider's statements to them. This document has been checked and approved by the attending provider.

## 2019-07-17 ENCOUNTER — Inpatient Hospital Stay: Payer: Federal, State, Local not specified - PPO

## 2019-07-17 ENCOUNTER — Other Ambulatory Visit: Payer: Self-pay

## 2019-07-17 ENCOUNTER — Ambulatory Visit
Admission: RE | Admit: 2019-07-17 | Discharge: 2019-07-17 | Disposition: A | Discharge status: Home / Self Care | Payer: Federal, State, Local not specified - PPO | Source: Ambulatory Visit | Attending: Hematology | Admitting: Hematology

## 2019-07-17 ENCOUNTER — Inpatient Hospital Stay: Payer: Federal, State, Local not specified - PPO | Attending: Hematology

## 2019-07-17 VITALS — BP 124/56 | HR 71 | Temp 98.6°F | Resp 18

## 2019-07-17 DIAGNOSIS — Z95828 Presence of other vascular implants and grafts: Secondary | ICD-10-CM

## 2019-07-17 DIAGNOSIS — Z170 Estrogen receptor positive status [ER+]: Secondary | ICD-10-CM | POA: Insufficient documentation

## 2019-07-17 DIAGNOSIS — C50411 Malignant neoplasm of upper-outer quadrant of right female breast: Secondary | ICD-10-CM | POA: Diagnosis present

## 2019-07-17 DIAGNOSIS — Z5112 Encounter for antineoplastic immunotherapy: Secondary | ICD-10-CM | POA: Insufficient documentation

## 2019-07-17 DIAGNOSIS — D509 Iron deficiency anemia, unspecified: Secondary | ICD-10-CM

## 2019-07-17 DIAGNOSIS — E2839 Other primary ovarian failure: Secondary | ICD-10-CM

## 2019-07-17 LAB — CBC WITH DIFFERENTIAL (CANCER CENTER ONLY)
Abs Immature Granulocytes: 0.01 10*3/uL (ref 0.00–0.07)
Basophils Absolute: 0.1 10*3/uL (ref 0.0–0.1)
Basophils Relative: 1 %
Eosinophils Absolute: 0.1 10*3/uL (ref 0.0–0.5)
Eosinophils Relative: 3 %
HCT: 39.5 % (ref 36.0–46.0)
Hemoglobin: 12.5 g/dL (ref 12.0–15.0)
Immature Granulocytes: 0 %
Lymphocytes Relative: 27 %
Lymphs Abs: 1.1 10*3/uL (ref 0.7–4.0)
MCH: 28.9 pg (ref 26.0–34.0)
MCHC: 31.6 g/dL (ref 30.0–36.0)
MCV: 91.4 fL (ref 80.0–100.0)
Monocytes Absolute: 0.5 10*3/uL (ref 0.1–1.0)
Monocytes Relative: 11 %
Neutro Abs: 2.4 10*3/uL (ref 1.7–7.7)
Neutrophils Relative %: 58 %
Platelet Count: 174 10*3/uL (ref 150–400)
RBC: 4.32 MIL/uL (ref 3.87–5.11)
RDW: 13.2 % (ref 11.5–15.5)
WBC Count: 4.2 10*3/uL (ref 4.0–10.5)
nRBC: 0 % (ref 0.0–0.2)

## 2019-07-17 LAB — CMP (CANCER CENTER ONLY)
ALT: 11 U/L (ref 0–44)
AST: 18 U/L (ref 15–41)
Albumin: 3.2 g/dL — ABNORMAL LOW (ref 3.5–5.0)
Alkaline Phosphatase: 91 U/L (ref 38–126)
Anion gap: 9 (ref 5–15)
BUN: 12 mg/dL (ref 8–23)
CO2: 28 mmol/L (ref 22–32)
Calcium: 8.8 mg/dL — ABNORMAL LOW (ref 8.9–10.3)
Chloride: 104 mmol/L (ref 98–111)
Creatinine: 0.74 mg/dL (ref 0.44–1.00)
GFR, Est AFR Am: >60 mL/min (ref >60)
GFR, Estimated: >60 mL/min (ref >60)
Glucose, Bld: 102 mg/dL — ABNORMAL HIGH (ref 70–99)
Potassium: 3.7 mmol/L (ref 3.5–5.1)
Sodium: 141 mmol/L (ref 135–145)
Total Bilirubin: 0.7 mg/dL (ref 0.3–1.2)
Total Protein: 6.3 g/dL — ABNORMAL LOW (ref 6.5–8.1)

## 2019-07-17 LAB — FERRITIN: Ferritin: 21 ng/mL (ref 11–307)

## 2019-07-17 MED ORDER — ACETAMINOPHEN 325 MG PO TABS
ORAL_TABLET | ORAL | Status: AC
  Filled 2019-07-17: qty 2

## 2019-07-17 MED ORDER — SODIUM CHLORIDE 0.9 % IV SOLN
Freq: Once | INTRAVENOUS | Status: AC
  Administered 2019-07-17: 09:00:00 via INTRAVENOUS
  Filled 2019-07-17: qty 250

## 2019-07-17 MED ORDER — HEPARIN SOD (PORK) LOCK FLUSH 100 UNIT/ML IV SOLN
500.0000 [IU] | Freq: Once | INTRAVENOUS | Status: AC | PRN
  Administered 2019-07-17: 500 [IU]
  Filled 2019-07-17: qty 5

## 2019-07-17 MED ORDER — DIPHENHYDRAMINE HCL 25 MG PO CAPS
ORAL_CAPSULE | ORAL | Status: AC
  Filled 2019-07-17: qty 2

## 2019-07-17 MED ORDER — DIPHENHYDRAMINE HCL 25 MG PO CAPS
50.0000 mg | ORAL_CAPSULE | Freq: Once | ORAL | Status: AC
  Administered 2019-07-17: 50 mg via ORAL

## 2019-07-17 MED ORDER — SODIUM CHLORIDE 0.9% FLUSH
10.0000 mL | Freq: Once | INTRAVENOUS | Status: AC
  Administered 2019-07-17: 10 mL
  Filled 2019-07-17: qty 10

## 2019-07-17 MED ORDER — TRASTUZUMAB-DKST CHEMO 150 MG IV SOLR
6.0000 mg/kg | Freq: Once | INTRAVENOUS | Status: AC
  Administered 2019-07-17: 504 mg via INTRAVENOUS
  Filled 2019-07-17: qty 24

## 2019-07-17 MED ORDER — ACETAMINOPHEN 325 MG PO TABS
650.0000 mg | ORAL_TABLET | Freq: Once | ORAL | Status: AC
  Administered 2019-07-17: 650 mg via ORAL

## 2019-07-17 MED ORDER — SODIUM CHLORIDE 0.9% FLUSH
10.0000 mL | INTRAVENOUS | Status: DC | PRN
  Administered 2019-07-17: 10 mL
  Filled 2019-07-17: qty 10

## 2019-07-17 NOTE — Patient Instructions (Signed)
[facility] Discharge Instructions for Patients Receiving Chemotherapy  Today you received the following chemotherapy agents: Trastuzumab   To help prevent nausea and vomiting after your treatment, we encourage you to take your nausea medication  as prescribed.    If you develop nausea and vomiting that is not controlled by your nausea medication, call the clinic.   BELOW ARE SYMPTOMS THAT SHOULD BE REPORTED IMMEDIATELY:  *FEVER GREATER THAN 100.5 F  *CHILLS WITH OR WITHOUT FEVER  NAUSEA AND VOMITING THAT IS NOT CONTROLLED WITH YOUR NAUSEA MEDICATION  *UNUSUAL SHORTNESS OF BREATH  *UNUSUAL BRUISING OR BLEEDING  TENDERNESS IN MOUTH AND THROAT WITH OR WITHOUT PRESENCE OF ULCERS  *URINARY PROBLEMS  *BOWEL PROBLEMS  UNUSUAL RASH Items with * indicate a potential emergency and should be followed up as soon as possible.  Feel free to call the clinic should you have any questions or concerns. The clinic phone number is [phone].  Please show the CHEMO ALERT CARD at check-in to the Emergency Department and triage nurse.   

## 2019-07-23 ENCOUNTER — Encounter: Payer: Self-pay | Admitting: Neurology

## 2019-07-29 ENCOUNTER — Other Ambulatory Visit: Payer: Self-pay

## 2019-07-29 ENCOUNTER — Ambulatory Visit: Payer: Federal, State, Local not specified - PPO | Admitting: Neurology

## 2019-07-29 ENCOUNTER — Telehealth: Payer: Self-pay

## 2019-07-29 ENCOUNTER — Ambulatory Visit (INDEPENDENT_AMBULATORY_CARE_PROVIDER_SITE_OTHER): Payer: Federal, State, Local not specified - PPO | Admitting: Neurology

## 2019-07-29 DIAGNOSIS — R252 Cramp and spasm: Secondary | ICD-10-CM

## 2019-07-29 DIAGNOSIS — G500 Trigeminal neuralgia: Secondary | ICD-10-CM

## 2019-07-29 DIAGNOSIS — R202 Paresthesia of skin: Secondary | ICD-10-CM | POA: Diagnosis not present

## 2019-07-29 DIAGNOSIS — Z0289 Encounter for other administrative examinations: Secondary | ICD-10-CM

## 2019-07-29 MED ORDER — DULOXETINE HCL 60 MG PO CPEP: 60.0000 mg | ORAL_CAPSULE | Freq: Every day | ORAL | 12 refills | Status: DC

## 2019-07-29 NOTE — Telephone Encounter (Signed)
Left VM for pt that Dr. Sondra Come would order PT for breast swelling/pain/tightness. This RN's direct number left for any further questions/concerns. Loma Sousa, RN BSN

## 2019-07-29 NOTE — Progress Notes (Signed)
I have add on Cymbalta 60mg  daily to her medication regime

## 2019-07-29 NOTE — Procedures (Signed)
Full Name: Summer [name] Gender: Female MRN #: [id number] Date of Birth: 10/14/1950    Visit Date: 07/29/2019 07:11 Age: 68 Years 3 Months Old Examining Physician: Marcial Pacas, MD  Referring Physician: Marcial Pacas, MD History:    68 year old female, with history of breast cancer treatment, presented with bilateral feet and toes paresthesia.  Summary of the test:  Nerve conduction study: Bilateral sural, superficial peroneal sensory responses were normal.  Left median, ulnar sensory and motor responses were normal.  Bilateral tibial, peroneal to EDB motor responses normal.  Electromyography: Selected needle examinations of left lower extremity muscles were normal.  Conclusion:   This is a normal study.  There is no electrodiagnostic evidence of large fiber peripheral neuropathy, or left lumbosacral radiculopathy.   ------------------------------- Marcial Pacas, M.D. Ph.D.  [facility] [place], [place] [address] Tel: [phone] Fax: [phone]        [facility]    Nerve / Sites Muscle Latency Ref. Amplitude Ref. Rel Amp Segments Distance Velocity Ref. Area    ms ms mV mV %  cm m/s m/s mVms  L Median - APB     Wrist APB 3.6 ?4.4 10.9 ?4.0 100 Wrist - APB 7   35.2     Upper arm APB 7.4  10.7  97.6 Upper arm - Wrist 21 55 ?49 34.4  L Ulnar - ADM     Wrist ADM 2.8 ?3.3 10.0 ?6.0 100 Wrist - ADM 7   43.8     B.Elbow ADM 5.8  9.0  89.3 B.Elbow - Wrist 18 59 ?49 41.5     A.Elbow ADM 7.8  8.9  98.8 A.Elbow - B.Elbow 10 52 ?49 40.9         A.Elbow - Wrist      L Peroneal - EDB     Ankle EDB 5.9 ?6.5 3.6 ?2.0 100 Ankle - EDB 9   15.0     Fib head EDB 12.4  3.7  104 Fib head - Ankle 29 45 ?44 14.4     Pop fossa EDB 14.6  3.6  97.4 Pop fossa - Fib head 10 46 ?44 13.3         Pop fossa - Ankle      R Peroneal - EDB     Ankle EDB 4.9 ?6.5 3.8 ?2.0 100 Ankle - EDB 9   14.9     Fib head EDB 11.7  3.4  90.4 Fib head - Ankle 30 44 ?44 14.5     Pop  fossa EDB 13.9  3.0  89.4 Pop fossa - Fib head 10 45 ?44 12.9         Pop fossa - Ankle      L Tibial - AH     Ankle AH 5.1 ?5.8 5.6 ?4.0 100 Ankle - AH 9   12.7     Pop fossa AH 13.6  4.9  87.5 Pop fossa - Ankle 37 43 ?41 11.9  R Tibial - AH     Ankle AH 4.5 ?5.8 6.1 ?4.0 100 Ankle - AH 9   12.7     Pop fossa AH 13.6  6.2  102 Pop fossa - Ankle 38 42 ?41 12.5                  SNC    Nerve / Sites Rec. Site Peak Lat Ref.  Amp Ref. Segments Distance    ms ms V V  cm  L Sural - Ankle (Calf)     Calf Ankle 3.4 ?4.4 11 ?6 Calf - Ankle 14  R Sural - Ankle (Calf)     Calf Ankle 3.5 ?4.4 10 ?6 Calf - Ankle 14  L Superficial peroneal - Ankle     Lat leg Ankle 4.0 ?4.4 7 ?6 Lat leg - Ankle 14  R Superficial peroneal - Ankle     Lat leg Ankle 4.1 ?4.4 6 ?6 Lat leg - Ankle 14  L Median - Orthodromic (Dig II, Mid palm)     Dig II Wrist 3.4 ?3.4 10 ?10 Dig II - Wrist 13  L Ulnar - Orthodromic, (Dig V, Mid palm)     Dig V Wrist 2.9 ?3.1 7 ?5 Dig V - Wrist 14                 F  Wave    Nerve F Lat Ref.   ms ms  L Tibial - AH 50.1 ?56.0  R Tibial - AH 52.6 ?56.0  L Ulnar - ADM 27.2 ?32.0           EMG       EMG Summary Table    Spontaneous MUAP Recruitment  Muscle IA Fib PSW Fasc Other Amp Dur. Poly Pattern  L. Tibialis anterior Normal None None None _______ Normal Normal Normal Normal  L. Tibialis posterior Normal None None None _______ Normal Normal Normal Normal  L. Peroneus longus Normal None None None _______ Normal Normal Normal Normal  L. Gastrocnemius (Medial head) Normal None None None _______ Normal Normal Normal Normal  L. Vastus lateralis Normal None None None _______ Normal Normal Normal Normal

## 2019-07-30 ENCOUNTER — Other Ambulatory Visit: Payer: Self-pay | Admitting: Radiation Oncology

## 2019-07-30 DIAGNOSIS — C50411 Malignant neoplasm of upper-outer quadrant of right female breast: Secondary | ICD-10-CM

## 2019-07-30 DIAGNOSIS — Z170 Estrogen receptor positive status [ER+]: Secondary | ICD-10-CM

## 2019-08-03 ENCOUNTER — Other Ambulatory Visit: Payer: Self-pay

## 2019-08-03 ENCOUNTER — Telehealth: Payer: Self-pay | Admitting: *Deleted

## 2019-08-03 ENCOUNTER — Ambulatory Visit: Payer: Federal, State, Local not specified - PPO | Attending: Radiation Oncology | Admitting: Physical Therapy

## 2019-08-03 DIAGNOSIS — L599 Disorder of the skin and subcutaneous tissue related to radiation, unspecified: Secondary | ICD-10-CM | POA: Diagnosis present

## 2019-08-03 DIAGNOSIS — M25611 Stiffness of right shoulder, not elsewhere classified: Secondary | ICD-10-CM

## 2019-08-03 DIAGNOSIS — I890 Lymphedema, not elsewhere classified: Secondary | ICD-10-CM | POA: Insufficient documentation

## 2019-08-03 DIAGNOSIS — R262 Difficulty in walking, not elsewhere classified: Secondary | ICD-10-CM | POA: Insufficient documentation

## 2019-08-03 DIAGNOSIS — M6281 Muscle weakness (generalized): Secondary | ICD-10-CM | POA: Insufficient documentation

## 2019-08-03 MED ORDER — DICLOFENAC SODIUM 1 % EX GEL: 2.0000 g | Freq: Four times a day (QID) | CUTANEOUS | 5 refills | Status: DC

## 2019-08-03 NOTE — Patient Instructions (Signed)
SHOULDER: Flexion - Supine (Cane)        Cancer Rehab [phone]    Hold cane in both hands. Raise arms up overhead. Do not allow back to arch. Hold _5__ seconds. Do __5-10__ times; __1-2__ times a day.   SELF ASSISTED WITH OBJECT: Shoulder Abduction / Adduction - Supine    Hold cane with both hands. Move both arms from side to side, keep elbows straight.  Hold when stretch felt for __5__ seconds. Repeat __5-10__ times; __1-2__ times a day. Once this becomes easier progress to third picture bringing affected arm towards ear by staying out to side. Same hold for _5_seconds. Repeat  _5-10_ times, _1-2_ times/day.  Shoulder Blade Stretch    Clasp fingers behind head with elbows touching in front of face. Pull elbows back while pressing shoulder blades together. Relax and hold as tolerated, can place pillow under elbow here for comfort as needed and to allow for prolonged stretch.  Repeat __5__ times. Do __1-2__ sessions per day.     First of all, check with your insurance company to see if provider is in [place] (for wigs and compression sleeves / gloves/gauntlets )  [place], [place] [address] [phone]  Will file some insurances --- call for appointment   Second to [facility] (for mastectomy prosthetics and garments) [place], [place] [address] [phone] Will file some insurances --- call for appointment  [facility]  [address]  [place], [place] [address] [phone] Lower extremity garments  [facility] [place], [place]  [address] [address] ( Medicaid certified lymphedema fitter) [phone] [email or web address]  [place]  [address], [place] [address] [phone]  Other Resources: National Lymphedema Network:  www.lymphnet.org www.[employer].com for  patient articles and self manual lymph drainage information www.[email or web address] has informative articles.  [email or web address] [email or web address]

## 2019-08-03 NOTE — Addendum Note (Signed)
Addended by: Noberto Retort C on: 08/03/2019 01:58 PM   Modules accepted: Orders

## 2019-08-03 NOTE — Telephone Encounter (Addendum)
Per vo by Dr. Felecia Shelling, okay to send in prescription for diclofenac gel 1%, apply four times daily prn, 100g tube x 5 refills.  Replied to patient's mychart message to let her know this prescription has been sent to the pharmacy.

## 2019-08-03 NOTE — Therapy (Signed)
[place], [place], [address] Phone: [phone]   Fax:  [phone]  Physical Therapy Evaluation  Patient Details  Name: Summer [name] MRN: [id number] Date of Birth: 1950/11/11 Referring Provider (PT): Dr. Sondra Come    Encounter Date: 08/03/2019  PT End of Session - 08/03/19 1030    Visit Number  1    Number of Visits  9    Date for PT Re-Evaluation  09/02/19    PT Start Time  0900    PT Stop Time  0957    PT Time Calculation (min)  57 min    Activity Tolerance  Patient tolerated treatment well    Behavior During Therapy  [facility] for tasks assessed/performed       Past Medical History:  Diagnosis Date  . Abdominal pain, left lower quadrant 11/09/2009   Qualifier: Diagnosis of  By: Valma Cava LPN, Izora Gala    . Allergic rhinitis 08/26/2008   Qualifier: Diagnosis of  By: Sherren Mocha MD, Jory Ee   . Anemia, iron deficiency 11/21/2018  . Arthritis shoulders and back  . BACK PAIN, CHRONIC 06/13/2007   Qualifier: Diagnosis of  By: Sherren Mocha MD, Jory Ee   . 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 Constipation 07/01/2018  . Costochondritis 05/04/2014  . Diverticulitis of colon 06/13/2007   S/p segmental colectomy for perforated diverticulitis   . Diverticulosis   . Facial pain 10/06/2015  . Fibromyalgia   . Foot sprain, left, initial encounter 08/31/2016  . Glossitis 02/02/2016  . Headache 04/24/2016  . HEMATURIA UNSPECIFIED 06/25/2008   Qualifier: Diagnosis of  By: Sherren Mocha MD, Jory Ee   . HEMATURIA, HX OF 11/14/2009   Qualifier: Diagnosis of  By: Regino Schultze CMA (AAMA), Apolonio Schneiders    . History of kidney stones   . History of kidney stones   . History of shingles 2012--  no residual pain  . Itching 10/13/2018  . Left eye pain 07/01/2018  . Left ureteral calculus   . LLQ pain 04/24/2016  . LOC OSTEOARTHROS NOT SPEC PRIM/SEC OTH [facility] SITE 06/27/2009   Qualifier: Diagnosis of   By: Sherren Mocha MD, Jory Ee   . Malignant neoplasm of upper-outer quadrant of right breast in female, estrogen receptor positive ([place]) 11/17/2018  . MCI (mild cognitive impairment)   . Memory loss 12/21/2016  . Morbid obesity ([place]) 11/09/2010  . Pain of right side of body 08/31/2016  . Pruritus 10/26/2016  . Right sided temporal headache 12/21/2016  . Tremor 12/21/2016  . Trigeminal neuralgia RIGHT  . Trigeminal neuralgia of right side of face 11/30/2010  . Trochanteric bursitis, right hip 10/26/2016  . Urgency of urination   . Weight loss 07/22/2017  . Zoster without complications 2/75/1700    Past Surgical History:  Procedure Laterality Date  . BREAST LUMPECTOMY WITH RADIOACTIVE SEED AND SENTINEL LYMPH NODE BIOPSY Right 11/28/2018   Procedure: RIGHT BREAST LUMPECTOMY WITH RADIOACTIVE SEED AND RIGHT AXILLARY SENTINEL LYMPH NODE BIOPSY;  Surgeon: Excell Seltzer, MD;  Location: [facility];  Service: General;  Laterality: Right;  . CEREBRAL MICROVASCULAR DECOMPRESSION  07-05-2006   RIGHT TRIGEMINAL NERVE  . CYSTOSCOPY/RETROGRADE/URETEROSCOPY/STONE EXTRACTION WITH BASKET  07/04/2012   Procedure: CYSTOSCOPY/RETROGRADE/URETEROSCOPY/STONE EXTRACTION WITH BASKET;  Surgeon: Claybon Jabs, MD;  Location: [facility];  Service: Urology;  Laterality: Left;  . EXPLORATORY LAPAROTOMY/ RESECTION MID TO DISTAL SIGMOID AND PROXIMAL RECTUM/ END PROXIMAL SIGMOID COLOSTOMY  07-17-2006   PERFORATED  DIVERTICULITIS WITH PERITONITIS  . gamma knife  05/17/2016   for trigeminal neuralgia, [facility], Dr Salomon Fick  . gamma knife  2019  . PORTACATH PLACEMENT Left 11/28/2018   Procedure: INSERTION PORT-A-CATH WITH ULTRASOUND;  Surgeon: Excell Seltzer, MD;  Location: [facility];  Service: General;  Laterality: Left;  . RESECTION COLOSTOMY/ CLOSURE COLOSTOMY WITH COLOPROCTOSTOMY  02-20-2007  . TONSILLECTOMY  AS CHILD  . URETEROSCOPY  07/04/2012   Procedure: URETEROSCOPY;  Surgeon: Claybon Jabs, MD;  [facility];  Service: Urology;  Laterality: Left;  Marland Kitchen VAGINAL HYSTERECTOMY  1998   Partial  . WISDOM TOOTH EXTRACTION      There were no vitals filed for this visit.   Subjective Assessment - 08/03/19 0913    Subjective  Pt states her symptoms change from day to day.  She intemittently has pain and tightness in her right breast and axilla,but feels ok to day.  She has been having trouble with cramping and numbness in her legs, feet and hands and thought that was why she was coming here. She has not been wearing a compression bra as she felt better  with less support on her breasts.    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 It measures 1 cm and is located in the lower outer quadrant. It is triple negative with a Ki67 of 20%. She also has trigeminal neuralgia.  Pt returns ton 08/03/2019 after lumpectomy ( March 20 with 2 sentinel nodes by Dr. Excell Seltzer) and radiation( Dr. Sondra Come)  and continues with chemotherapy ( Dr. Burr Medico) until next April. ( She gets a treatment every 3 weeks) She has started to experience  neuropathy in her hands and feet and is being treated by her nueurologist Dr. Krista Blue for that. She is sent her by Dr. Sondra Come for breast lymphedmea    Patient Stated Goals  do  what she needs to do    Currently in Pain?  No/denies   just swelling and tight        [facility] PT Assessment - 08/03/19 0001      Assessment   Medical Diagnosis  Right breast cancer    Referring Provider (PT)  Dr. Sondra Come     Onset Date/Surgical Date  10/30/18    Hand Dominance  Right    Prior Therapy  baseline eval       Precautions   Precautions  Other (comment)    Precaution Comments  at risk for lymphedema       Restrictions   Weight Bearing Restrictions  No      Balance Screen   Has the patient fallen in the past 6 months  No    Has the patient had a decrease in activity level because of a fear of falling?   No       Home Environment   Living Environment  Private residence    [place]   She lives with her 66 y.o. son   Available Help at Discharge  Family      Prior Function   Level of Independence  Independent    Vocation  Full time employment    Geophysicist/field seismologist service    works at [employer]    Leisure  walks about 2-3 times a week for an hour or more    sometimes limited to feet cramping      Cognition   Overall Cognitive  Status  Within Functional Limits for tasks assessed      Observation/Other Assessments   Observations  pt comes in wearing a thin sports type of bra that she had from home.  She says the more compressive bras were not comfortable.  She has large pendulous breast with flatness in left breast, more fullness in right breast  has larger, darker right breast with palpable fullness especially around aereola     Quick DASH   63.64      Sensation   Additional Comments  not formally tested, but pt reports increasing problems in hands and feet       Functional Tests   Functional tests  Sit to Stand      Sit to Stand   Comments  8 repetition in 30 seconds RPE 5/10       Posture/Postural Control   Posture/Postural Control  Postural limitations    Postural Limitations  Rounded Shoulders;Forward head;Decreased lumbar lordosis      ROM / Strength   AROM / PROM / Strength  Strength      AROM   Right Shoulder Flexion  145 Degrees    Right Shoulder ABduction  165 Degrees    Right Shoulder External Rotation  85 Degrees    Left Shoulder Flexion  170 Degrees    Left Shoulder ABduction  170 Degrees    Left Shoulder External Rotation  90 Degrees      Strength   Overall Strength  Deficits    Overall Strength Comments  appears to have generalized muscle atrophy     Strength Assessment Site  Hand    Right/Left hand  Right;Left    Right Hand Grip (lbs)  45/55/50    Left Hand Grip (lbs)  35/40/40      Palpation   Palpation comment  Palpable  fullness in dependent areas of breast and also in right axilla with arm overhead.  Pt does not have palpable cording       Standardized Balance Assessment   Standardized Balance Assessment  Timed Up and Go Test      Timed Up and Go Test   Normal TUG (seconds)  9.06        LYMPHEDEMA/ONCOLOGY QUESTIONNAIRE - 08/03/19 1024      Type   Cancer Type  Right breast cancer       Surgeries   Lumpectomy Date  11/28/18    Number Lymph Nodes Removed  2      Treatment   Active Chemotherapy Treatment  Yes    Past Radiation Treatment  No    Current Hormone Treatment  Yes    Drug Name  aromasin      What other symptoms do you have   Are you Having Heaviness or Tightness  Yes    Are you having Pain  Yes    Are you having pitting edema  No    Is it Hard or Difficult finding clothes that fit  No    Do you have infections  No    Is there Decreased scar mobility  No    Stemmer Sign  No      Lymphedema Assessments   Lymphedema Assessments  Upper extremities      Right Upper Extremity Lymphedema   10 cm Proximal to Olecranon Process  31 cm    Olecranon Process  23.5 cm    15 cm Proximal to Ulnar Styloid Process  25.5 cm    10 cm Proximal to Ulnar  Styloid Process  23 cm    Just Proximal to Ulnar Styloid Process  19 cm    Across Hand at [employer]  19.2 cm    At [place] of 2nd Digit  6.2 cm      Left Upper Extremity Lymphedema   10 cm Proximal to Olecranon Process  29.5 cm    Olecranon Process  24 cm    15 cm Proximal to Ulnar Styloid Process  24 cm    10 cm Proximal to Ulnar Styloid Process  21.2 cm    Just Proximal to Ulnar Styloid Process  16 cm    Across Hand at [employer]  19 cm    At [place] of 2nd Digit  6.2 cm          Quick Dash - 08/03/19 0001    Open a tight or new jar  Unable    Do heavy household chores (wash walls, wash floors)  Severe difficulty    Carry a shopping bag or briefcase  Severe difficulty    Wash your back  Severe difficulty    Use a knife to  cut food  Unable    Recreational activities in which you take some force or impact through your arm, shoulder, or hand (golf, hammering, tennis)  Severe difficulty    During the past week, to what extent has your arm, shoulder or hand problem interfered with your normal social activities with family, friends, neighbors, or groups?  Modererately    During the past week, to what extent has your arm, shoulder or hand problem limited your work or other regular daily activities  Modererately    Arm, shoulder, or hand pain.  Moderate    Tingling (pins and needles) in your arm, shoulder, or hand  Moderate    Difficulty Sleeping  No difficulty    DASH Score  63.64 %           Objective measurements completed on examination: See above findings.      [place] Adult PT Treatment/Exercise - 08/03/19 0001      Self-Care   Self-Care  Other Self-Care Comments    Other Self-Care Comments   gave pt information to call [facility] for compressin bra       Exercises   Exercises  Shoulder      Shoulder Exercises: Supine   Other Supine Exercises  dowel rod flexion and abduction and butterfly stretch to stretch axilla and anterior chest                   PT Long Term Goals - 08/03/19 1042      PT LONG TERM GOAL #1   Title  Patient will demonstrate she has regained full shoulder ROM and function post operaitvely compared to baselines.    Time  4    Period  Weeks    Status  New      PT LONG TERM GOAL #2   Title  Pt will report she can manage right breast lymphedema with self MLD and use of compression    Time  4    Period  Weeks    Status  New      PT LONG TERM GOAL #3   Title  Pt will be independent in a home exercise program for ROM and strength    Time  4    Period  Weeks    Status  New      PT LONG  TERM GOAL #4   Title  Pt will reduce Quick DASH to < 35 indicating and improvment in function of arms    Baseline  63.64 on 08/03/2019    Time  4    Period  Weeks    Status   New      PT LONG TERM GOAL #5   Title  Pt will increase # of reps of sit to stand in 30 seconds to 10  indicating an improvement in general strength    Time  4    Period  Weeks    Status  New             Plan - 08/03/19 1031    Clinical Impression Statement  Pt comes to PT after completion of surgery and radiation but with ongoing chemo.  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 She has decreased sit to stand reps in 30 sec and TUG  indicating a general functional weakness and and decreased balance  She will benefit from PT to mitigate these effects of cancer treatment .    Personal Factors and Comorbidities  Comorbidity 3+    Comorbidities  previous surgery and radiation, ongoing chemo with CIPN symptoms    Examination-Activity Limitations  Locomotion Level    Stability/Clinical Decision Making  Evolving/Moderate complexity    Clinical Decision Making  Moderate    Rehab Potential  Excellent    PT Frequency  2x / week    PT Duration  4 weeks    PT Treatment/Interventions  ADLs/Self Care Home Management;Patient/family education;Therapeutic exercise    PT Next Visit Plan  begin teach self MLD and use of compression ( assess compression bras as she gets them)  to manage lymphedema of breast and in axilla. Begin HEP for core and general strength and balance, consider Strength ABC program    PT Home Exercise Plan  Post op shoulder ROM HEP, script request sent to Bary Castilla to send to [facility] ,    Consulted and Agree with Plan of Care  Patient       Patient will benefit from skilled therapeutic intervention in order to improve the following deficits and impairments:  Decreased range of motion, Impaired UE functional use, Pain,  Decreased knowledge of precautions, Postural dysfunction, Increased fascial restricitons, Increased muscle spasms, Decreased endurance, Impaired perceived functional ability, Decreased strength, Increased edema, Decreased mobility, Impaired sensation  Visit Diagnosis: Disorder of the skin and subcutaneous tissue related to radiation, unspecified - Plan: PT plan of care cert/re-cert  Lymphedema, not elsewhere classified - Plan: PT plan of care cert/re-cert  Muscle weakness (generalized) - Plan: PT plan of care cert/re-cert  Difficulty in walking, not elsewhere classified - Plan: PT plan of care cert/re-cert  Stiffness of right shoulder, not elsewhere classified - Plan: PT plan of care cert/re-cert     Problem List Patient Active Problem List   Diagnosis Date Noted  . Muscle cramps 05/12/2019  . Cramps, muscle, general 04/23/2019  . Tinnitus 04/16/2019  . Chest pain of uncertain etiology 26/71/2458  . Bilateral leg edema 02/10/2019  . Port-A-Cath in place 01/30/2019  . Anemia, iron deficiency 11/21/2018  . Malignant neoplasm of upper-outer quadrant of right breast in female, estrogen receptor positive ([place]) 11/17/2018  .  Itching 10/13/2018  . Left eye pain 07/01/2018  . Constipation 07/01/2018  . Weight loss 07/22/2017  . Memory loss 12/21/2016  . Tremor 12/21/2016  . Right sided temporal headache 12/21/2016  . Trochanteric bursitis, right hip 10/26/2016  . Pruritus 10/26/2016  . Foot sprain, left, initial encounter 08/31/2016  . Pain of right side of body 08/31/2016  . Headache 04/24/2016  . LLQ pain 04/24/2016  . Glossitis 02/02/2016  . Routine general medical examination at a health care facility 10/27/2015  . Facial pain 10/06/2015  . Trigeminal neuralgia of right side of face 11/30/2010  . Morbid obesity ([place]) 11/09/2010  . Fibromyalgia 11/09/2010  . HEMATURIA, HX OF 11/14/2009  . LOC OSTEOARTHROS NOT SPEC PRIM/SEC OTH [facility] SITE 06/27/2009  . Allergic rhinitis  08/26/2008  . 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 Owens Shark PT  Norwood Levo 08/03/2019, 10:47 AM  [place], [place], [address] Phone: [phone]   Fax:  [phone]  Name: Summer [name] MRN: [id number] Date of Birth: 1950/11/01

## 2019-08-03 NOTE — Telephone Encounter (Signed)
Email from patient:  Please note DULoxetine is the only prescription turned in no gel for my feet was given to pharmacist.  please send in for me.  Thanks

## 2019-08-04 ENCOUNTER — Other Ambulatory Visit: Payer: Self-pay | Admitting: Hematology

## 2019-08-07 ENCOUNTER — Other Ambulatory Visit: Payer: Self-pay

## 2019-08-07 ENCOUNTER — Inpatient Hospital Stay: Payer: Federal, State, Local not specified - PPO

## 2019-08-07 ENCOUNTER — Telehealth: Payer: Self-pay | Admitting: Hematology

## 2019-08-07 VITALS — BP 152/61 | HR 87 | Temp 98.6°F | Resp 17 | Ht 66.0 in | Wt 192.1 lb

## 2019-08-07 DIAGNOSIS — D509 Iron deficiency anemia, unspecified: Secondary | ICD-10-CM

## 2019-08-07 DIAGNOSIS — Z170 Estrogen receptor positive status [ER+]: Secondary | ICD-10-CM

## 2019-08-07 DIAGNOSIS — C50411 Malignant neoplasm of upper-outer quadrant of right female breast: Secondary | ICD-10-CM | POA: Diagnosis not present

## 2019-08-07 LAB — CBC WITH DIFFERENTIAL (CANCER CENTER ONLY)
Abs Immature Granulocytes: 0.02 10*3/uL (ref 0.00–0.07)
Basophils Absolute: 0.1 10*3/uL (ref 0.0–0.1)
Basophils Relative: 1 %
Eosinophils Absolute: 0.1 10*3/uL (ref 0.0–0.5)
Eosinophils Relative: 2 %
HCT: 39.4 % (ref 36.0–46.0)
Hemoglobin: 12.4 g/dL (ref 12.0–15.0)
Immature Granulocytes: 0 %
Lymphocytes Relative: 23 %
Lymphs Abs: 1.1 10*3/uL (ref 0.7–4.0)
MCH: 29.1 pg (ref 26.0–34.0)
MCHC: 31.5 g/dL (ref 30.0–36.0)
MCV: 92.5 fL (ref 80.0–100.0)
Monocytes Absolute: 0.4 10*3/uL (ref 0.1–1.0)
Monocytes Relative: 9 %
Neutro Abs: 3 10*3/uL (ref 1.7–7.7)
Neutrophils Relative %: 65 %
Platelet Count: 202 10*3/uL (ref 150–400)
RBC: 4.26 MIL/uL (ref 3.87–5.11)
RDW: 13.4 % (ref 11.5–15.5)
WBC Count: 4.6 10*3/uL (ref 4.0–10.5)
nRBC: 0 % (ref 0.0–0.2)

## 2019-08-07 LAB — IRON AND TIBC
Iron: 95 ug/dL (ref 41–142)
Saturation Ratios: 28 % (ref 21–57)
TIBC: 338 ug/dL (ref 236–444)
UIBC: 243 ug/dL (ref 120–384)

## 2019-08-07 LAB — CMP (CANCER CENTER ONLY)
ALT: 15 U/L (ref 0–44)
AST: 21 U/L (ref 15–41)
Albumin: 3.4 g/dL — ABNORMAL LOW (ref 3.5–5.0)
Alkaline Phosphatase: 101 U/L (ref 38–126)
Anion gap: 10 (ref 5–15)
BUN: 13 mg/dL (ref 8–23)
CO2: 26 mmol/L (ref 22–32)
Calcium: 8.6 mg/dL — ABNORMAL LOW (ref 8.9–10.3)
Chloride: 104 mmol/L (ref 98–111)
Creatinine: 0.78 mg/dL (ref 0.44–1.00)
GFR, Est AFR Am: >60 mL/min (ref >60)
GFR, Estimated: >60 mL/min (ref >60)
Glucose, Bld: 140 mg/dL — ABNORMAL HIGH (ref 70–99)
Potassium: 3.7 mmol/L (ref 3.5–5.1)
Sodium: 140 mmol/L (ref 135–145)
Total Bilirubin: 1.4 mg/dL — ABNORMAL HIGH (ref 0.3–1.2)
Total Protein: 6.6 g/dL (ref 6.5–8.1)

## 2019-08-07 MED ORDER — SODIUM CHLORIDE 0.9% FLUSH
10.0000 mL | INTRAVENOUS | Status: DC | PRN
  Administered 2019-08-07: 10 mL
  Filled 2019-08-07: qty 10

## 2019-08-07 MED ORDER — DIPHENHYDRAMINE HCL 25 MG PO CAPS
ORAL_CAPSULE | ORAL | Status: AC
  Filled 2019-08-07: qty 2

## 2019-08-07 MED ORDER — ACETAMINOPHEN 325 MG PO TABS
ORAL_TABLET | ORAL | Status: AC
  Filled 2019-08-07: qty 2

## 2019-08-07 MED ORDER — DIPHENHYDRAMINE HCL 25 MG PO CAPS
50.0000 mg | ORAL_CAPSULE | Freq: Once | ORAL | Status: AC
  Administered 2019-08-07: 50 mg via ORAL

## 2019-08-07 MED ORDER — ACETAMINOPHEN 325 MG PO TABS
650.0000 mg | ORAL_TABLET | Freq: Once | ORAL | Status: AC
  Administered 2019-08-07: 650 mg via ORAL

## 2019-08-07 MED ORDER — TRASTUZUMAB-DKST CHEMO 150 MG IV SOLR
6.0000 mg/kg | Freq: Once | INTRAVENOUS | Status: AC
  Administered 2019-08-07: 504 mg via INTRAVENOUS
  Filled 2019-08-07: qty 24

## 2019-08-07 MED ORDER — HEPARIN SOD (PORK) LOCK FLUSH 100 UNIT/ML IV SOLN
500.0000 [IU] | Freq: Once | INTRAVENOUS | Status: AC | PRN
  Administered 2019-08-07: 500 [IU]
  Filled 2019-08-07: qty 5

## 2019-08-07 MED ORDER — SODIUM CHLORIDE 0.9 % IV SOLN
Freq: Once | INTRAVENOUS | Status: AC
  Administered 2019-08-07: 10:00:00 via INTRAVENOUS
  Filled 2019-08-07: qty 250

## 2019-08-07 NOTE — Patient Instructions (Signed)
[facility] Discharge Instructions for Patients Receiving Chemotherapy  Today you received the following chemotherapy agent: Ogivri   To help prevent nausea and vomiting after your treatment, we encourage you to take your nausea medication as directed.    If you develop nausea and vomiting that is not controlled by your nausea medication, call the clinic.   BELOW ARE SYMPTOMS THAT SHOULD BE REPORTED IMMEDIATELY:  *FEVER GREATER THAN 100.5 F  *CHILLS WITH OR WITHOUT FEVER  NAUSEA AND VOMITING THAT IS NOT CONTROLLED WITH YOUR NAUSEA MEDICATION  *UNUSUAL SHORTNESS OF BREATH  *UNUSUAL BRUISING OR BLEEDING  TENDERNESS IN MOUTH AND THROAT WITH OR WITHOUT PRESENCE OF ULCERS  *URINARY PROBLEMS  *BOWEL PROBLEMS  UNUSUAL RASH Items with * indicate a potential emergency and should be followed up as soon as possible.  Feel free to call the clinic should you have any questions or concerns. The clinic phone number is [phone].  Please show the CHEMO ALERT CARD at check-in to the Emergency Department and triage nurse.   

## 2019-08-07 NOTE — Telephone Encounter (Signed)
Added lab prior to 9:30 am chemo this morning per 11/27 schedule message. Not able to reach patient.

## 2019-08-11 ENCOUNTER — Other Ambulatory Visit: Payer: Self-pay

## 2019-08-11 ENCOUNTER — Ambulatory Visit: Payer: Federal, State, Local not specified - PPO | Admitting: Neurology

## 2019-08-11 ENCOUNTER — Ambulatory Visit: Payer: Federal, State, Local not specified - PPO | Attending: Radiation Oncology

## 2019-08-11 ENCOUNTER — Telehealth: Payer: Self-pay

## 2019-08-11 ENCOUNTER — Telehealth: Payer: Federal, State, Local not specified - PPO | Admitting: Cardiology

## 2019-08-11 DIAGNOSIS — R262 Difficulty in walking, not elsewhere classified: Secondary | ICD-10-CM | POA: Diagnosis present

## 2019-08-11 DIAGNOSIS — M6281 Muscle weakness (generalized): Secondary | ICD-10-CM | POA: Diagnosis present

## 2019-08-11 DIAGNOSIS — R293 Abnormal posture: Secondary | ICD-10-CM | POA: Diagnosis present

## 2019-08-11 DIAGNOSIS — L599 Disorder of the skin and subcutaneous tissue related to radiation, unspecified: Secondary | ICD-10-CM | POA: Diagnosis not present

## 2019-08-11 DIAGNOSIS — Z170 Estrogen receptor positive status [ER+]: Secondary | ICD-10-CM | POA: Diagnosis present

## 2019-08-11 DIAGNOSIS — M25611 Stiffness of right shoulder, not elsewhere classified: Secondary | ICD-10-CM | POA: Diagnosis present

## 2019-08-11 DIAGNOSIS — C50511 Malignant neoplasm of lower-outer quadrant of right female breast: Secondary | ICD-10-CM | POA: Diagnosis present

## 2019-08-11 DIAGNOSIS — I890 Lymphedema, not elsewhere classified: Secondary | ICD-10-CM | POA: Diagnosis present

## 2019-08-11 NOTE — Therapy (Signed)
[place], [place], [address] Phone: [phone]   Fax:  [phone]  Physical Therapy Treatment  Patient Details  Name: Summer [name] MRN: [id number] Date of Birth: July 18, 1951 Referring Provider (PT): Dr. Sondra Come    Encounter Date: 08/11/2019  PT End of Session - 08/11/19 0906    Visit Number  2    Number of Visits  9    Date for PT Re-Evaluation  09/02/19    PT Start Time  0803    PT Stop Time  0904    PT Time Calculation (min)  61 min    Activity Tolerance  Patient tolerated treatment well    Behavior During Therapy  [facility] for tasks assessed/performed       Past Medical History:  Diagnosis Date  . Abdominal pain, left lower quadrant 11/09/2009   Qualifier: Diagnosis of  By: Valma Cava LPN, Izora Gala    . Allergic rhinitis 08/26/2008   Qualifier: Diagnosis of  By: Sherren Mocha MD, Jory Ee   . Anemia, iron deficiency 11/21/2018  . Arthritis shoulders and back  . BACK PAIN, CHRONIC 06/13/2007   Qualifier: Diagnosis of  By: Sherren Mocha MD, Jory Ee   . 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 Constipation 07/01/2018  . Costochondritis 05/04/2014  . Diverticulitis of colon 06/13/2007   S/p segmental colectomy for perforated diverticulitis   . Diverticulosis   . Facial pain 10/06/2015  . Fibromyalgia   . Foot sprain, left, initial encounter 08/31/2016  . Glossitis 02/02/2016  . Headache 04/24/2016  . HEMATURIA UNSPECIFIED 06/25/2008   Qualifier: Diagnosis of  By: Sherren Mocha MD, Jory Ee   . HEMATURIA, HX OF 11/14/2009   Qualifier: Diagnosis of  By: Regino Schultze CMA (AAMA), Apolonio Schneiders    . History of kidney stones   . History of kidney stones   . History of shingles 2012--  no residual pain  . Itching 10/13/2018  . Left eye pain 07/01/2018  . Left ureteral calculus   . LLQ pain 04/24/2016  . LOC OSTEOARTHROS NOT SPEC PRIM/SEC OTH [facility] SITE 06/27/2009   Qualifier: Diagnosis of   By: Sherren Mocha MD, Jory Ee   . Malignant neoplasm of upper-outer quadrant of right breast in female, estrogen receptor positive ([place]) 11/17/2018  . MCI (mild cognitive impairment)   . Memory loss 12/21/2016  . Morbid obesity ([place]) 11/09/2010  . Pain of right side of body 08/31/2016  . Pruritus 10/26/2016  . Right sided temporal headache 12/21/2016  . Tremor 12/21/2016  . Trigeminal neuralgia RIGHT  . Trigeminal neuralgia of right side of face 11/30/2010  . Trochanteric bursitis, right hip 10/26/2016  . Urgency of urination   . Weight loss 07/22/2017  . Zoster without complications 8/67/6720    Past Surgical History:  Procedure Laterality Date  . BREAST LUMPECTOMY WITH RADIOACTIVE SEED AND SENTINEL LYMPH NODE BIOPSY Right 11/28/2018   Procedure: RIGHT BREAST LUMPECTOMY WITH RADIOACTIVE SEED AND RIGHT AXILLARY SENTINEL LYMPH NODE BIOPSY;  Surgeon: Excell Seltzer, MD;  Location: [place];  Service: General;  Laterality: Right;  . CEREBRAL MICROVASCULAR DECOMPRESSION  07-05-2006   RIGHT TRIGEMINAL NERVE  . CYSTOSCOPY/RETROGRADE/URETEROSCOPY/STONE EXTRACTION WITH BASKET  07/04/2012   Procedure: CYSTOSCOPY/RETROGRADE/URETEROSCOPY/STONE EXTRACTION WITH BASKET;  Surgeon: Claybon Jabs, MD;  Location: [facility];  Service: Urology;  Laterality: Left;  . EXPLORATORY LAPAROTOMY/ RESECTION MID TO DISTAL SIGMOID AND PROXIMAL RECTUM/ END PROXIMAL SIGMOID COLOSTOMY  07-17-2006   PERFORATED  DIVERTICULITIS WITH PERITONITIS  . gamma knife  05/17/2016   for trigeminal neuralgia, [facility], Dr Salomon Fick  . gamma knife  2019  . PORTACATH PLACEMENT Left 11/28/2018   Procedure: INSERTION PORT-A-CATH WITH ULTRASOUND;  Surgeon: Excell Seltzer, MD;  Location: [place];  Service: General;  Laterality: Left;  . RESECTION COLOSTOMY/ CLOSURE COLOSTOMY WITH COLOPROCTOSTOMY  02-20-2007  . TONSILLECTOMY  AS CHILD  . URETEROSCOPY  07/04/2012   Procedure: URETEROSCOPY;  Surgeon: Claybon Jabs, MD;  Location:  [facility];  Service: Urology;  Laterality: Left;  Marland Kitchen VAGINAL HYSTERECTOMY  1998   Partial  . WISDOM TOOTH EXTRACTION      There were no vitals filed for this visit.  Subjective Assessment - 08/11/19 0804    Subjective  I'm a little sore with some pain today in my Rt shoulder and axilla this morning. I think I overdid it this weekend with Thanksgiving and decorating my house for Christmas and it's just still sore from that. I have an appt at [facility] in mid Dec to be measured for a compression bra.    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 It measures 1 cm and is located in the lower outer quadrant. It is triple negative with a Ki67 of 20%. She also has trigeminal neuralgia.  Pt returns ton 08/03/2019 after lumpectomy ( March 20 with 2 sentinel nodes by Dr. Excell Seltzer) and radiation( Dr. Sondra Come)  and continues with chemotherapy ( Dr. Burr Medico) until next April. ( She gets a treatment every 3 weeks) She has started to experience  neuropathy in her hands and feet and is being treated by her nueurologist Dr. Krista Blue for that. She is sent her by Dr. Sondra Come for breast lymphedmea    Patient Stated Goals  do  what she needs to do    Currently in Pain?  Yes    Pain Score  4     Pain Location  Shoulder    Pain Orientation  Right    Pain Descriptors / Indicators  Sore    Pain Type  Surgical pain    Pain Onset  In the past 7 days    Pain Frequency  Intermittent    Aggravating Factors   just sore from the exercises    Pain Relieving Factors  rest                       [facility] Adult PT Treatment/Exercise - 08/11/19 0001      Manual Therapy   Manual Therapy  Manual Lymphatic Drainage (MLD);Myofascial release;Passive ROM    Myofascial Release  To Rt axilla during P/ROM and gentle UE pulling    Manual Lymphatic Drainage (MLD)  In Supine: Short neck,5 diaphragmatic breaths Rt inguinal and Lt axilla  nodes, Rt axillo-inguinal and anterior inter-axillary anastomosis, then focused on Rt breast, then into Lt S/L for further work to lateral breast and posterior inter-axillary and Lt axillo-inguinal anastomosis, then finished retracing steps in supine beginning to teach pt during    Passive ROM  In Supine to Rt shoulder into flexion, abduction and D2 to pts tolerance                  PT Long Term Goals - 08/03/19 1042      PT LONG TERM GOAL #1   Title  Patient will demonstrate she has regained full shoulder ROM and function post operaitvely  compared to baselines.    Time  4    Period  Weeks    Status  New      PT LONG TERM GOAL #2   Title  Pt will report she can manage right breast lymphedema with self MLD and use of compression    Time  4    Period  Weeks    Status  New      PT LONG TERM GOAL #3   Title  Pt will be independent in a home exercise program for ROM and strength    Time  4    Period  Weeks    Status  New      PT LONG TERM GOAL #4   Title  Pt will reduce Quick DASH to < 35 indicating and improvment in function of arms    Baseline  63.64 on 08/03/2019    Time  4    Period  Weeks    Status  New      PT LONG TERM GOAL #5   Title  Pt will increase # of reps of sit to stand in 30 seconds to 10  indicating an improvement in general strength    Time  4    Period  Weeks    Status  New            Plan - 08/11/19 1021    Clinical Impression Statement  First session of manual lymph drainage of Rt breast and included P/ROM of Rt shoulder. Began instructing pt in MLD while performing and some softening was noted at lateral aspect of breast. Pt repors some soreness of Rt shoulder after but reports this has been normal after HEP stretching at home as well.    Personal Factors and Comorbidities  Comorbidity 3+    Comorbidities  previous surgery and radiation, ongoing chemo with CIPN symptoms    Examination-Activity Limitations  Locomotion Level     Stability/Clinical Decision Making  Evolving/Moderate complexity    Rehab Potential  Excellent    PT Frequency  2x / week    PT Duration  4 weeks    PT Treatment/Interventions  ADLs/Self Care Home Management;Patient/family education;Therapeutic exercise    PT Next Visit Plan  Cont and teach self MLD having pt return correct demo and assess use of compression ( assess compression bras as she gets them)  to manage lymphedema of breast and in axilla. Begin HEP for core and general strength and balance, consider Strength ABC program    PT Home Exercise Plan  Post op shoulder ROM HEP, script request sent to Bary Castilla to send to [facility] ,    Consulted and Agree with Plan of Care  Patient       Patient will benefit from skilled therapeutic intervention in order to improve the following deficits and impairments:  Decreased range of motion, Impaired UE functional use, Pain, Decreased knowledge of precautions, Postural dysfunction, Increased fascial restricitons, Increased muscle spasms, Decreased endurance, Impaired perceived functional ability, Decreased strength, Increased edema, Decreased mobility, Impaired sensation  Visit Diagnosis: Disorder of the skin and subcutaneous tissue related to radiation, unspecified  Lymphedema, not elsewhere classified  Muscle weakness (generalized)  Difficulty in walking, not elsewhere classified  Stiffness of right shoulder, not elsewhere classified     Problem List Patient Active Problem List   Diagnosis Date Noted  . Muscle cramps 05/12/2019  . Cramps, muscle, general 04/23/2019  . Tinnitus 04/16/2019  . Chest pain of uncertain etiology  02/10/2019  . Bilateral leg edema 02/10/2019  . Port-A-Cath in place 01/30/2019  . Anemia, iron deficiency 11/21/2018  . Malignant neoplasm of upper-outer quadrant of right breast in female, estrogen receptor positive ([place]) 11/17/2018  . Itching 10/13/2018  . Left eye pain 07/01/2018  . Constipation  07/01/2018  . Weight loss 07/22/2017  . Memory loss 12/21/2016  . Tremor 12/21/2016  . Right sided temporal headache 12/21/2016  . Trochanteric bursitis, right hip 10/26/2016  . Pruritus 10/26/2016  . Foot sprain, left, initial encounter 08/31/2016  . Pain of right side of body 08/31/2016  . Headache 04/24/2016  . LLQ pain 04/24/2016  . Glossitis 02/02/2016  . Routine general medical examination at a health care facility 10/27/2015  . Facial pain 10/06/2015  . Trigeminal neuralgia of right side of face 11/30/2010  . Morbid obesity ([place]) 11/09/2010  . Fibromyalgia 11/09/2010  . HEMATURIA, HX OF 11/14/2009  . LOC OSTEOARTHROS NOT SPEC PRIM/SEC OTH [facility] SITE 06/27/2009  . Allergic rhinitis 08/26/2008  . 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

## 2019-08-11 NOTE — Telephone Encounter (Signed)
Spoke with patient to have appointment with Dr. Radford Pax today 12/01 rescheduled. Patient did not feel that she needed an appointment so did not want to reschedule.

## 2019-08-11 NOTE — Telephone Encounter (Signed)
Left message with patient to call back about her appointment with Dr. Radford Pax today at 3:20PM. Per Dr. Radford Pax would like to reschedule her for 12/3 at 9:20AM

## 2019-08-13 ENCOUNTER — Other Ambulatory Visit: Payer: Self-pay

## 2019-08-13 ENCOUNTER — Encounter: Payer: Self-pay | Admitting: Rehabilitation

## 2019-08-13 ENCOUNTER — Ambulatory Visit: Payer: Federal, State, Local not specified - PPO | Admitting: Rehabilitation

## 2019-08-13 DIAGNOSIS — C50511 Malignant neoplasm of lower-outer quadrant of right female breast: Secondary | ICD-10-CM

## 2019-08-13 DIAGNOSIS — I890 Lymphedema, not elsewhere classified: Secondary | ICD-10-CM

## 2019-08-13 DIAGNOSIS — R262 Difficulty in walking, not elsewhere classified: Secondary | ICD-10-CM

## 2019-08-13 DIAGNOSIS — M25611 Stiffness of right shoulder, not elsewhere classified: Secondary | ICD-10-CM

## 2019-08-13 DIAGNOSIS — L599 Disorder of the skin and subcutaneous tissue related to radiation, unspecified: Secondary | ICD-10-CM

## 2019-08-13 DIAGNOSIS — R293 Abnormal posture: Secondary | ICD-10-CM

## 2019-08-13 DIAGNOSIS — M6281 Muscle weakness (generalized): Secondary | ICD-10-CM

## 2019-08-13 DIAGNOSIS — Z170 Estrogen receptor positive status [ER+]: Secondary | ICD-10-CM

## 2019-08-13 NOTE — Therapy (Signed)
[place], [place], [address] Phone: [phone]   Fax:  [phone]  Physical Therapy Treatment  Patient Details  Name: Melodi Happel MRN: [id number] Date of Birth: 09/13/1950 Referring Provider (PT): Dr. Sondra Come    Encounter Date: 08/13/2019  PT End of Session - 08/13/19 1705    Visit Number  3    Number of Visits  9    Date for PT Re-Evaluation  09/02/19    PT Start Time  1603    PT Stop Time  1656    PT Time Calculation (min)  53 min    Activity Tolerance  Patient tolerated treatment well    Behavior During Therapy  [facility] for tasks assessed/performed       Past Medical History:  Diagnosis Date  . Abdominal pain, left lower quadrant 11/09/2009   Qualifier: Diagnosis of  By: Valma Cava LPN, Izora Gala    . Allergic rhinitis 08/26/2008   Qualifier: Diagnosis of  By: Sherren Mocha MD, Jory Ee   . Anemia, iron deficiency 11/21/2018  . Arthritis shoulders and back  . BACK PAIN, CHRONIC 06/13/2007   Qualifier: Diagnosis of  By: Sherren Mocha MD, Jory Ee   . 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 Constipation 07/01/2018  . Costochondritis 05/04/2014  . Diverticulitis of colon 06/13/2007   S/p segmental colectomy for perforated diverticulitis   . Diverticulosis   . Facial pain 10/06/2015  . Fibromyalgia   . Foot sprain, left, initial encounter 08/31/2016  . Glossitis 02/02/2016  . Headache 04/24/2016  . HEMATURIA UNSPECIFIED 06/25/2008   Qualifier: Diagnosis of  By: Sherren Mocha MD, Jory Ee   . HEMATURIA, HX OF 11/14/2009   Qualifier: Diagnosis of  By: Regino Schultze CMA (AAMA), Apolonio Schneiders    . History of kidney stones   . History of kidney stones   . History of shingles 2012--  no residual pain  . Itching 10/13/2018  . Left eye pain 07/01/2018  . Left ureteral calculus   . LLQ pain 04/24/2016  . LOC OSTEOARTHROS NOT SPEC PRIM/SEC OTH [facility] SITE 06/27/2009   Qualifier: Diagnosis of   By: Sherren Mocha MD, Jory Ee   . Malignant neoplasm of upper-outer quadrant of right breast in female, estrogen receptor positive ([place]) 11/17/2018  . MCI (mild cognitive impairment)   . Memory loss 12/21/2016  . Morbid obesity ([place]) 11/09/2010  . Pain of right side of body 08/31/2016  . Pruritus 10/26/2016  . Right sided temporal headache 12/21/2016  . Tremor 12/21/2016  . Trigeminal neuralgia RIGHT  . Trigeminal neuralgia of right side of face 11/30/2010  . Trochanteric bursitis, right hip 10/26/2016  . Urgency of urination   . Weight loss 07/22/2017  . Zoster without complications 8/50/2774    Past Surgical History:  Procedure Laterality Date  . BREAST LUMPECTOMY WITH RADIOACTIVE SEED AND SENTINEL LYMPH NODE BIOPSY Right 11/28/2018   Procedure: RIGHT BREAST LUMPECTOMY WITH RADIOACTIVE SEED AND RIGHT AXILLARY SENTINEL LYMPH NODE BIOPSY;  Surgeon: Excell Seltzer, MD;  Location: [place];  Service: General;  Laterality: Right;  . CEREBRAL MICROVASCULAR DECOMPRESSION  07-05-2006   RIGHT TRIGEMINAL NERVE  . CYSTOSCOPY/RETROGRADE/URETEROSCOPY/STONE EXTRACTION WITH BASKET  07/04/2012   Procedure: CYSTOSCOPY/RETROGRADE/URETEROSCOPY/STONE EXTRACTION WITH BASKET;  Surgeon: Claybon Jabs, MD;  Location: [facility];  Service: Urology;  Laterality: Left;  . EXPLORATORY LAPAROTOMY/ RESECTION MID TO DISTAL SIGMOID AND PROXIMAL RECTUM/ END PROXIMAL SIGMOID COLOSTOMY  07-17-2006   PERFORATED  DIVERTICULITIS WITH PERITONITIS  . gamma knife  05/17/2016   for trigeminal neuralgia, [facility], Dr Salomon Fick  . gamma knife  2019  . PORTACATH PLACEMENT Left 11/28/2018   Procedure: INSERTION PORT-A-CATH WITH ULTRASOUND;  Surgeon: Excell Seltzer, MD;  Location: [place];  Service: General;  Laterality: Left;  . RESECTION COLOSTOMY/ CLOSURE COLOSTOMY WITH COLOPROCTOSTOMY  02-20-2007  . TONSILLECTOMY  AS CHILD  . URETEROSCOPY  07/04/2012   Procedure: URETEROSCOPY;  Surgeon: Claybon Jabs, MD;  Location:  [facility];  Service: Urology;  Laterality: Left;  Marland Kitchen VAGINAL HYSTERECTOMY  1998   Partial  . WISDOM TOOTH EXTRACTION      There were no vitals filed for this visit.  Subjective Assessment - 08/13/19 1605    Subjective  My left foot is starting to cramp right now.    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 It measures 1 cm and is located in the lower outer quadrant. It is triple negative with a Ki67 of 20%. She also has trigeminal neuralgia.  Pt returns ton 08/03/2019 after lumpectomy ( March 20 with 2 sentinel nodes by Dr. Excell Seltzer) and radiation( Dr. Sondra Come)  and continues with chemotherapy ( Dr. Burr Medico) until next April. ( She gets a treatment every 3 weeks) She has started to experience  neuropathy in her hands and feet and is being treated by her nueurologist Dr. Krista Blue for that. She is sent her by Dr. Sondra Come for breast lymphedmea    Patient Stated Goals  do  what she needs to do    Currently in Pain?  Yes    Pain Score  5     Pain Location  Ankle    Pain Orientation  Left    Pain Descriptors / Indicators  Cramping    Pain Type  Chronic pain    Pain Onset  Today    Pain Frequency  Constant                       [facility] Adult PT Treatment/Exercise - 08/13/19 0001      Manual Therapy   Manual Lymphatic Drainage (MLD)  In Supine: Short neck,5 diaphragmatic breaths Rt inguinal and Lt axilla nodes, Rt axillo-inguinal and anterior inter-axillary anastomosis, then focused on Rt breast, then into Lt S/L for further work to lateral breast and posterior inter-axillary and Lt axillo-inguinal anastomosis, then finished retracing steps in supine beginning to teach pt during. Focused mainly on self MDL with written instructions added to the handout as needed. Pt able to perform mainly supine and using opposite hand to move around the large breast tissue as needed                  PT  Long Term Goals - 08/03/19 1042      PT LONG TERM GOAL #1   Title  Patient will demonstrate she has regained full shoulder ROM and function post operaitvely compared to baselines.    Time  4    Period  Weeks    Status  New      PT LONG TERM GOAL #2   Title  Pt will report she can manage right breast lymphedema with self MLD and use of compression    Time  4    Period  Weeks    Status  New      PT LONG TERM GOAL #3   Title  Pt  will be independent in a home exercise program for ROM and strength    Time  4    Period  Weeks    Status  New      PT LONG TERM GOAL #4   Title  Pt will reduce Quick DASH to < 35 indicating and improvment in function of arms    Baseline  63.64 on 08/03/2019    Time  4    Period  Weeks    Status  New      PT LONG TERM GOAL #5   Title  Pt will increase # of reps of sit to stand in 30 seconds to 10  indicating an improvement in general strength    Time  4    Period  Weeks    Status  New            Plan - 08/13/19 1707    Clinical Impression Statement  Focused on self MLD today with PT reading instruction and then helping pt perform accordingly.  Made notes on handout accordingly.  Pt reports the breast feels great today and only some fibrosis noted in sidelying to the lateral breast.  Pt does continue to have increased Rt shoulder pain she thinks due to anastrozole and stretching.  May be ready for gait/balance/TE soon    PT Frequency  2x / week    PT Duration  4 weeks    PT Treatment/Interventions  ADLs/Self Care Home Management;Patient/family education;Therapeutic exercise;Manual techniques;Passive range of motion;Manual lymph drainage    PT Next Visit Plan  how was self MLD, check performance again, need to move on to balance and strength for neuropathy soon? see how pt feels about status    Consulted and Agree with Plan of Care  Patient       Patient will benefit from skilled therapeutic intervention in order to improve the following deficits  and impairments:     Visit Diagnosis: Disorder of the skin and subcutaneous tissue related to radiation, unspecified  Lymphedema, not elsewhere classified  Muscle weakness (generalized)  Difficulty in walking, not elsewhere classified  Stiffness of right shoulder, not elsewhere classified  Malignant neoplasm of lower-outer quadrant of right breast of female, estrogen receptor positive ([place])  Abnormal posture     Problem List Patient Active Problem List   Diagnosis Date Noted  . Muscle cramps 05/12/2019  . Cramps, muscle, general 04/23/2019  . Tinnitus 04/16/2019  . Chest pain of uncertain etiology 16/38/4536  . Bilateral leg edema 02/10/2019  . Port-A-Cath in place 01/30/2019  . Anemia, iron deficiency 11/21/2018  . Malignant neoplasm of upper-outer quadrant of right breast in female, estrogen receptor positive ([place]) 11/17/2018  . Itching 10/13/2018  . Left eye pain 07/01/2018  . Constipation 07/01/2018  . Weight loss 07/22/2017  . Memory loss 12/21/2016  . Tremor 12/21/2016  . Right sided temporal headache 12/21/2016  . Trochanteric bursitis, right hip 10/26/2016  . Pruritus 10/26/2016  . Foot sprain, left, initial encounter 08/31/2016  . Pain of right side of body 08/31/2016  . Headache 04/24/2016  . LLQ pain 04/24/2016  . Glossitis 02/02/2016  . Routine general medical examination at a health care facility 10/27/2015  . Facial pain 10/06/2015  . Trigeminal neuralgia of right side of face 11/30/2010  . Morbid obesity ([place]) 11/09/2010  . Fibromyalgia 11/09/2010  . HEMATURIA, HX OF 11/14/2009  . LOC OSTEOARTHROS NOT SPEC PRIM/SEC OTH [facility] SITE 06/27/2009  . Allergic rhinitis 08/26/2008  . VULVA INTRAEPITHELIAL NEOPLASIA, VIN  I 06/18/2008    Stark Bray 08/13/2019, 5:10 PM  [place], [place], [address] Phone: [phone]   Fax:  [phone]  Name: Bonnita Newby MRN: [id number] Date of Birth: 1951/07/15

## 2019-08-13 NOTE — Patient Instructions (Signed)

## 2019-08-17 ENCOUNTER — Other Ambulatory Visit: Payer: Self-pay

## 2019-08-17 ENCOUNTER — Ambulatory Visit: Payer: Federal, State, Local not specified - PPO

## 2019-08-17 DIAGNOSIS — L599 Disorder of the skin and subcutaneous tissue related to radiation, unspecified: Secondary | ICD-10-CM

## 2019-08-17 DIAGNOSIS — R293 Abnormal posture: Secondary | ICD-10-CM

## 2019-08-17 DIAGNOSIS — M25611 Stiffness of right shoulder, not elsewhere classified: Secondary | ICD-10-CM

## 2019-08-17 DIAGNOSIS — M6281 Muscle weakness (generalized): Secondary | ICD-10-CM

## 2019-08-17 DIAGNOSIS — R262 Difficulty in walking, not elsewhere classified: Secondary | ICD-10-CM

## 2019-08-17 DIAGNOSIS — I890 Lymphedema, not elsewhere classified: Secondary | ICD-10-CM

## 2019-08-17 DIAGNOSIS — Z170 Estrogen receptor positive status [ER+]: Secondary | ICD-10-CM

## 2019-08-17 DIAGNOSIS — C50511 Malignant neoplasm of lower-outer quadrant of right female breast: Secondary | ICD-10-CM

## 2019-08-17 NOTE — Therapy (Signed)
[place], [place], [address] Phone: [phone]   Fax:  [phone]  Physical Therapy Treatment  Patient Details  Name: Summer [name] MRN: [id number] Date of Birth: 1951/02/02 Referring Provider (PT): Dr. Sondra Come    Encounter Date: 08/17/2019  PT End of Session - 08/17/19 0806    Visit Number  4    Number of Visits  9    Date for PT Re-Evaluation  09/02/19    PT Start Time  0805    PT Stop Time  0900    PT Time Calculation (min)  55 min    Activity Tolerance  Patient tolerated treatment well    Behavior During Therapy  [facility] for tasks assessed/performed       Past Medical History:  Diagnosis Date  . Abdominal pain, left lower quadrant 11/09/2009   Qualifier: Diagnosis of  By: Valma Cava LPN, Izora Gala    . Allergic rhinitis 08/26/2008   Qualifier: Diagnosis of  By: Sherren Mocha MD, Jory Ee   . Anemia, iron deficiency 11/21/2018  . Arthritis shoulders and back  . BACK PAIN, CHRONIC 06/13/2007   Qualifier: Diagnosis of  By: Sherren Mocha MD, Jory Ee   . 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 Constipation 07/01/2018  . Costochondritis 05/04/2014  . Diverticulitis of colon 06/13/2007   S/p segmental colectomy for perforated diverticulitis   . Diverticulosis   . Facial pain 10/06/2015  . Fibromyalgia   . Foot sprain, left, initial encounter 08/31/2016  . Glossitis 02/02/2016  . Headache 04/24/2016  . HEMATURIA UNSPECIFIED 06/25/2008   Qualifier: Diagnosis of  By: Sherren Mocha MD, Jory Ee   . HEMATURIA, HX OF 11/14/2009   Qualifier: Diagnosis of  By: Regino Schultze CMA (AAMA), Apolonio Schneiders    . History of kidney stones   . History of kidney stones   . History of shingles 2012--  no residual pain  . Itching 10/13/2018  . Left eye pain 07/01/2018  . Left ureteral calculus   . LLQ pain 04/24/2016  . LOC OSTEOARTHROS NOT SPEC PRIM/SEC OTH [facility] SITE 06/27/2009   Qualifier: Diagnosis of   By: Sherren Mocha MD, Jory Ee   . Malignant neoplasm of upper-outer quadrant of right breast in female, estrogen receptor positive ([place]) 11/17/2018  . MCI (mild cognitive impairment)   . Memory loss 12/21/2016  . Morbid obesity ([place]) 11/09/2010  . Pain of right side of body 08/31/2016  . Pruritus 10/26/2016  . Right sided temporal headache 12/21/2016  . Tremor 12/21/2016  . Trigeminal neuralgia RIGHT  . Trigeminal neuralgia of right side of face 11/30/2010  . Trochanteric bursitis, right hip 10/26/2016  . Urgency of urination   . Weight loss 07/22/2017  . Zoster without complications 0/25/4270    Past Surgical History:  Procedure Laterality Date  . BREAST LUMPECTOMY WITH RADIOACTIVE SEED AND SENTINEL LYMPH NODE BIOPSY Right 11/28/2018   Procedure: RIGHT BREAST LUMPECTOMY WITH RADIOACTIVE SEED AND RIGHT AXILLARY SENTINEL LYMPH NODE BIOPSY;  Surgeon: Excell Seltzer, MD;  Location: [place];  Service: General;  Laterality: Right;  . CEREBRAL MICROVASCULAR DECOMPRESSION  07-05-2006   RIGHT TRIGEMINAL NERVE  . CYSTOSCOPY/RETROGRADE/URETEROSCOPY/STONE EXTRACTION WITH BASKET  07/04/2012   Procedure: CYSTOSCOPY/RETROGRADE/URETEROSCOPY/STONE EXTRACTION WITH BASKET;  Surgeon: Claybon Jabs, MD;  Location: [facility];  Service: Urology;  Laterality: Left;  . EXPLORATORY LAPAROTOMY/ RESECTION MID TO DISTAL SIGMOID AND PROXIMAL RECTUM/ END PROXIMAL SIGMOID COLOSTOMY  07-17-2006   PERFORATED  DIVERTICULITIS WITH PERITONITIS  . gamma knife  05/17/2016   for trigeminal neuralgia, [facility], Dr Salomon Fick  . gamma knife  2019  . PORTACATH PLACEMENT Left 11/28/2018   Procedure: INSERTION PORT-A-CATH WITH ULTRASOUND;  Surgeon: Excell Seltzer, MD;  Location: [place];  Service: General;  Laterality: Left;  . RESECTION COLOSTOMY/ CLOSURE COLOSTOMY WITH COLOPROCTOSTOMY  02-20-2007  . TONSILLECTOMY  AS CHILD  . URETEROSCOPY  07/04/2012   Procedure: URETEROSCOPY;  Surgeon: Claybon Jabs, MD;  Location:  [facility];  Service: Urology;  Laterality: Left;  Marland Kitchen VAGINAL HYSTERECTOMY  1998   Partial  . WISDOM TOOTH EXTRACTION      There were no vitals filed for this visit.  Subjective Assessment - 08/17/19 0807    Subjective  Pt reports that occasoinally she gets a shooting pain in her L breast. She has no pain in her R side at this time. She states that she does have neuropathy in both feet with pain at the tip of her toes.    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 It measures 1 cm and is located in the lower outer quadrant. It is triple negative with a Ki67 of 20%. She also has trigeminal neuralgia.  Pt returns ton 08/03/2019 after lumpectomy ( March 20 with 2 sentinel nodes by Dr. Excell Seltzer) and radiation( Dr. Sondra Come)  and continues with chemotherapy ( Dr. Burr Medico) until next April. ( She gets a treatment every 3 weeks) She has started to experience  neuropathy in her hands and feet and is being treated by her nueurologist Dr. Krista Blue for that. She is sent her by Dr. Sondra Come for breast lymphedmea    Patient Stated Goals  do  what she needs to do                       [facility] Adult PT Treatment/Exercise - 08/17/19 0001      Manual Therapy   Manual Therapy  Manual Lymphatic Drainage (MLD);Soft tissue mobilization    Soft tissue mobilization  in supine tightness/tenderness noted to the long head of the R biceps; decreased moderately following STM    Manual Lymphatic Drainage (MLD)  In Supine: 5 diaphragmatic breathes, short neck, Bil axillary ondes, Bil inguinal nodes, established R axillo-inguinal and anterior interaxillary anastomosis, superior R breast, anterior interaxillary anastomosis, inferior R breast, R axillo-inguinal anastomosis, then reworked all surfaces pt was educated througohut with therapist only helping with hand placement, sequencing and direction/skin stretch/pressure.     Passive ROM   P/ROM into flexoin/abduction and external rotation with light end range stretching and intermittent STM to the long head of the biceps due to pt reporting pain in this area which is limiting ROM due to pain.              PT Education - 08/17/19 0950    Education Details  Pt was educated on possible use of compression to assist with trigeminal neralgia pain, discussed the role of the piriformis in her LLE pain due to pt has siginificant pain and cramping in her LLE when sitting in figure 4 position in chair. Discussed that physical therapy most likely will not help with numbness and her MD may be able to give her a better idea if she should expect any improvement with this symptom. Discussed working on balance and hip/Ankle/LE strength next session. Pt was educated on MLD with an emphasis on skin  stretch, direction, hand placement and pressure with physical therapist direction for sequencing.    Person(s) Educated  Patient    Methods  Explanation;Demonstration;Tactile cues;Verbal cues;Handout    Comprehension  Verbalized understanding;Returned demonstration          PT Long Term Goals - 08/03/19 1042      PT LONG TERM GOAL #1   Title  Patient will demonstrate she has regained full shoulder ROM and function post operaitvely compared to baselines.    Time  4    Period  Weeks    Status  New      PT LONG TERM GOAL #2   Title  Pt will report she can manage right breast lymphedema with self MLD and use of compression    Time  4    Period  Weeks    Status  New      PT LONG TERM GOAL #3   Title  Pt will be independent in a home exercise program for ROM and strength    Time  4    Period  Weeks    Status  New      PT LONG TERM GOAL #4   Title  Pt will reduce Quick DASH to < 35 indicating and improvment in function of arms    Baseline  63.64 on 08/03/2019    Time  4    Period  Weeks    Status  New      PT LONG TERM GOAL #5   Title  Pt will increase # of reps of sit to stand in  30 seconds to 10  indicating an improvement in general strength    Time  4    Period  Weeks    Status  New            Plan - 08/17/19 0569    Clinical Impression Statement  Pt went over self MLD for the R breast with demonstration, VC and tactile cueing with hand over hand contact for correct technique. An emphasis on direction, skin stretch and pressure this session to ensure adequate movement of fluid throughout self MLD. Pt was assisted with hand placement and switching between R and L UE in order to improve ease of movement. Palpable tightness/tenderness noted at the long head of the R biceps; decreased moderately following STM. Discussed neuropathy and the role of physical therapy in neuropathy with patient including that PT may help with cramping symptoms but most likely will not help with numbness. Discussed use of compression at ther face for her trigeminal neuralgia due to pt reports increased pain when turning her head to that side in supine and feels better when turning her head toward the opposite side to assist with support to decrease symptoms. Pt was educated on the piriformis and the role of the piriformis when sitting and crossing legs in figure 4 due to pt states that she gets cramping and pain in her LLE when crossing her legs in figure 4 position while sitting. Pt will benefit from continue POC at this time.    Personal Factors and Comorbidities  Comorbidity 3+    Comorbidities  previous surgery and radiation, ongoing chemo with CIPN symptoms    Examination-Activity Limitations  Locomotion Level    PT Frequency  2x / week    PT Duration  4 weeks    PT Treatment/Interventions  ADLs/Self Care Home Management;Patient/family education;Therapeutic exercise;Manual techniques;Passive range of motion;Manual lymph drainage    PT Next Visit Plan  Assess piriformis, begin heel raises and pelvic stabiliation exercises, need to move on to balance and strength for neuropathy soon? see how  pt feels about status    PT Home Exercise Plan  Post op shoulder ROM HEP, script request sent to Bary Castilla to send to [facility]    Recommended Other Services  check for script    Consulted and Agree with Plan of Care  Patient       Patient will benefit from skilled therapeutic intervention in order to improve the following deficits and impairments:  Decreased range of motion, Impaired UE functional use, Pain, Decreased knowledge of precautions, Postural dysfunction, Increased fascial restricitons, Increased muscle spasms, Decreased endurance, Impaired perceived functional ability, Decreased strength, Increased edema, Decreased mobility, Impaired sensation  Visit Diagnosis: Disorder of the skin and subcutaneous tissue related to radiation, unspecified  Lymphedema, not elsewhere classified  Muscle weakness (generalized)  Difficulty in walking, not elsewhere classified  Stiffness of right shoulder, not elsewhere classified  Malignant neoplasm of lower-outer quadrant of right breast of female, estrogen receptor positive (HCC)  Abnormal posture     Problem List Patient Active Problem List   Diagnosis Date Noted  . Muscle cramps 05/12/2019  . Cramps, muscle, general 04/23/2019  . Tinnitus 04/16/2019  . Chest pain of uncertain etiology 75/61/2548  . Bilateral leg edema 02/10/2019  . Port-A-Cath in place 01/30/2019  . Anemia, iron deficiency 11/21/2018  . Malignant neoplasm of upper-outer quadrant of right breast in female, estrogen receptor positive ([place]) 11/17/2018  . Itching 10/13/2018  . Left eye pain 07/01/2018  . Constipation 07/01/2018  . Weight loss 07/22/2017  . Memory loss 12/21/2016  . Tremor 12/21/2016  . Right sided temporal headache 12/21/2016  . Trochanteric bursitis, right hip 10/26/2016  . Pruritus 10/26/2016  . Foot sprain, left, initial encounter 08/31/2016  . Pain of right side of body 08/31/2016  . Headache 04/24/2016  . LLQ pain 04/24/2016  .  Glossitis 02/02/2016  . Routine general medical examination at a health care facility 10/27/2015  . Facial pain 10/06/2015  . Trigeminal neuralgia of right side of face 11/30/2010  . Morbid obesity ([place]) 11/09/2010  . Fibromyalgia 11/09/2010  . HEMATURIA, HX OF 11/14/2009  . LOC OSTEOARTHROS NOT SPEC PRIM/SEC OTH [facility] SITE 06/27/2009  . Allergic rhinitis 08/26/2008  . 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

## 2019-08-19 ENCOUNTER — Ambulatory Visit: Payer: Federal, State, Local not specified - PPO

## 2019-08-19 ENCOUNTER — Other Ambulatory Visit: Payer: Self-pay

## 2019-08-19 DIAGNOSIS — C50511 Malignant neoplasm of lower-outer quadrant of right female breast: Secondary | ICD-10-CM

## 2019-08-19 DIAGNOSIS — L599 Disorder of the skin and subcutaneous tissue related to radiation, unspecified: Secondary | ICD-10-CM | POA: Diagnosis not present

## 2019-08-19 DIAGNOSIS — R293 Abnormal posture: Secondary | ICD-10-CM

## 2019-08-19 DIAGNOSIS — Z170 Estrogen receptor positive status [ER+]: Secondary | ICD-10-CM

## 2019-08-19 DIAGNOSIS — M25611 Stiffness of right shoulder, not elsewhere classified: Secondary | ICD-10-CM

## 2019-08-19 DIAGNOSIS — M6281 Muscle weakness (generalized): Secondary | ICD-10-CM

## 2019-08-19 DIAGNOSIS — R262 Difficulty in walking, not elsewhere classified: Secondary | ICD-10-CM

## 2019-08-19 DIAGNOSIS — I890 Lymphedema, not elsewhere classified: Secondary | ICD-10-CM

## 2019-08-19 NOTE — Therapy (Signed)
[place], [place], [address] Phone: [phone]   Fax:  [phone]  Physical Therapy Treatment  Patient Details  Name: Summer [name] MRN: [id number] Date of Birth: 1951/05/25 Referring Provider (PT): Dr. Sondra Come    Encounter Date: 08/19/2019  PT End of Session - 08/19/19 0805    Visit Number  5    Number of Visits  9    Date for PT Re-Evaluation  09/02/19    PT Start Time  0803    PT Stop Time  0900    PT Time Calculation (min)  57 min    Activity Tolerance  Patient tolerated treatment well    Behavior During Therapy  [facility] for tasks assessed/performed       Past Medical History:  Diagnosis Date  . Abdominal pain, left lower quadrant 11/09/2009   Qualifier: Diagnosis of  By: Valma Cava LPN, Izora Gala    . Allergic rhinitis 08/26/2008   Qualifier: Diagnosis of  By: Sherren Mocha MD, Jory Ee   . Anemia, iron deficiency 11/21/2018  . Arthritis shoulders and back  . BACK PAIN, CHRONIC 06/13/2007   Qualifier: Diagnosis of  By: Sherren Mocha MD, Jory Ee   . 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 Constipation 07/01/2018  . Costochondritis 05/04/2014  . Diverticulitis of colon 06/13/2007   S/p segmental colectomy for perforated diverticulitis   . Diverticulosis   . Facial pain 10/06/2015  . Fibromyalgia   . Foot sprain, left, initial encounter 08/31/2016  . Glossitis 02/02/2016  . Headache 04/24/2016  . HEMATURIA UNSPECIFIED 06/25/2008   Qualifier: Diagnosis of  By: Sherren Mocha MD, Jory Ee   . HEMATURIA, HX OF 11/14/2009   Qualifier: Diagnosis of  By: Regino Schultze CMA (AAMA), Apolonio Schneiders    . History of kidney stones   . History of kidney stones   . History of shingles 2012--  no residual pain  . Itching 10/13/2018  . Left eye pain 07/01/2018  . Left ureteral calculus   . LLQ pain 04/24/2016  . LOC OSTEOARTHROS NOT SPEC PRIM/SEC OTH [facility] SITE 06/27/2009   Qualifier: Diagnosis of   By: Sherren Mocha MD, Jory Ee   . Malignant neoplasm of upper-outer quadrant of right breast in female, estrogen receptor positive ([place]) 11/17/2018  . MCI (mild cognitive impairment)   . Memory loss 12/21/2016  . Morbid obesity ([place]) 11/09/2010  . Pain of right side of body 08/31/2016  . Pruritus 10/26/2016  . Right sided temporal headache 12/21/2016  . Tremor 12/21/2016  . Trigeminal neuralgia RIGHT  . Trigeminal neuralgia of right side of face 11/30/2010  . Trochanteric bursitis, right hip 10/26/2016  . Urgency of urination   . Weight loss 07/22/2017  . Zoster without complications 0/13/1438    Past Surgical History:  Procedure Laterality Date  . BREAST LUMPECTOMY WITH RADIOACTIVE SEED AND SENTINEL LYMPH NODE BIOPSY Right 11/28/2018   Procedure: RIGHT BREAST LUMPECTOMY WITH RADIOACTIVE SEED AND RIGHT AXILLARY SENTINEL LYMPH NODE BIOPSY;  Surgeon: Excell Seltzer, MD;  Location: [place];  Service: General;  Laterality: Right;  . CEREBRAL MICROVASCULAR DECOMPRESSION  07-05-2006   RIGHT TRIGEMINAL NERVE  . CYSTOSCOPY/RETROGRADE/URETEROSCOPY/STONE EXTRACTION WITH BASKET  07/04/2012   Procedure: CYSTOSCOPY/RETROGRADE/URETEROSCOPY/STONE EXTRACTION WITH BASKET;  Surgeon: Claybon Jabs, MD;  Location: [facility];  Service: Urology;  Laterality: Left;  . EXPLORATORY LAPAROTOMY/ RESECTION MID TO DISTAL SIGMOID AND PROXIMAL RECTUM/ END PROXIMAL SIGMOID COLOSTOMY  07-17-2006   PERFORATED  DIVERTICULITIS WITH PERITONITIS  . gamma knife  05/17/2016   for trigeminal neuralgia, [facility], Dr Salomon Fick  . gamma knife  2019  . PORTACATH PLACEMENT Left 11/28/2018   Procedure: INSERTION PORT-A-CATH WITH ULTRASOUND;  Surgeon: Excell Seltzer, MD;  Location: [place];  Service: General;  Laterality: Left;  . RESECTION COLOSTOMY/ CLOSURE COLOSTOMY WITH COLOPROCTOSTOMY  02-20-2007  . TONSILLECTOMY  AS CHILD  . URETEROSCOPY  07/04/2012   Procedure: URETEROSCOPY;  Surgeon: Claybon Jabs, MD;  Location:  [facility];  Service: Urology;  Laterality: Left;  Marland Kitchen VAGINAL HYSTERECTOMY  1998   Partial  . WISDOM TOOTH EXTRACTION      There were no vitals filed for this visit.  Subjective Assessment - 08/19/19 0806    Subjective  Pt states that she has a little bit of pain in her R anterior shoulder.    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 It measures 1 cm and is located in the lower outer quadrant. It is triple negative with a Ki67 of 20%. She also has trigeminal neuralgia.  Pt returns ton 08/03/2019 after lumpectomy ( March 20 with 2 sentinel nodes by Dr. Excell Seltzer) and radiation( Dr. Sondra Come)  and continues with chemotherapy ( Dr. Burr Medico) until next April. ( She gets a treatment every 3 weeks) She has started to experience  neuropathy in her hands and feet and is being treated by her nueurologist Dr. Krista Blue for that. She is sent her by Dr. Sondra Come for breast lymphedmea    Patient Stated Goals  do  what she needs to do    Currently in Pain?  Yes    Pain Score  3     Pain Location  Shoulder    Pain Orientation  Right    Pain Descriptors / Indicators  Aching    Pain Type  Chronic pain                       [facility] Adult PT Treatment/Exercise - 08/19/19 0001      Exercises   Exercises  Knee/Hip      Knee/Hip Exercises: Standing   Heel Raises  Both;20 reps;1 set    Heel Raises Limitations  VC for slow movements and tactile cueing to prevent leaning posteriorly.     Other Standing Knee Exercises  Toe raises 20x with hand held assist       Knee/Hip Exercises: Supine   Other Supine Knee/Hip Exercises  Supine clamshell with green theraband 2x 10  with VC for slow and correct movement.     Other Supine Knee/Hip Exercises  Supine piriformis stretch 2x 20 seconds Bil with pt rpeorting on the L VC for correct movement.       Manual Therapy   Manual Therapy  Soft tissue mobilization    Soft tissue  mobilization  In side-lying: L piriformis light STM pt had spasming that was causing a ticklish sensation that decreased significantly, Bil LE at gastroc/soleus and anterior tibialis decreased moderately following light STM             PT Education - 08/19/19 0901    Education Details  --          PT Long Term Goals - 08/03/19 1042      PT LONG TERM GOAL #1   Title  Patient will demonstrate she has regained full shoulder ROM and function post operaitvely compared to baselines.  Time  4    Period  Weeks    Status  New      PT LONG TERM GOAL #2   Title  Pt will report she can manage right breast lymphedema with self MLD and use of compression    Time  4    Period  Weeks    Status  New      PT LONG TERM GOAL #3   Title  Pt will be independent in a home exercise program for ROM and strength    Time  4    Period  Weeks    Status  New      PT LONG TERM GOAL #4   Title  Pt will reduce Quick DASH to < 35 indicating and improvment in function of arms    Baseline  63.64 on 08/03/2019    Time  4    Period  Weeks    Status  New      PT LONG TERM GOAL #5   Title  Pt will increase # of reps of sit to stand in 30 seconds to 10  indicating an improvement in general strength    Time  4    Period  Weeks    Status  New            Plan - 08/19/19 0803    Clinical Impression Statement  Pt presents with palpable tightness/tenderness noted in the L piriformis and lower legs, improved significantly following very light STM with spasms decreasing in the L piriformis. Pt was able to tolerate light pelvic stabilization ther-ex and lower leg strengthening w/o an increase in pain or cramping. Pt was provided with HEP and green theraband she will continue at this time with her HEP for her shoulders. Pt will benefit from continued POC at this time.    Personal Factors and Comorbidities  Comorbidity 3+    Comorbidities  previous surgery and radiation, ongoing chemo with CIPN symptoms     Examination-Activity Limitations  Locomotion Level    PT Frequency  2x / week    PT Duration  4 weeks    PT Treatment/Interventions  ADLs/Self Care Home Management;Patient/family education;Therapeutic exercise;Manual techniques;Passive range of motion;Manual lymph drainage    PT Next Visit Plan  Assess STM tothe piriformis and lower legs and new HEP , begin heel raises and pelvic stabiliation exercises, need to move on to balance and strength for neuropathy soon? see how pt feels about status    PT Home Exercise Plan  Post op shoulder ROM HEP, script request sent to Bary Castilla to send to [facility], Access Code: [email or web address]    Consulted and Agree with Plan of Care  Patient       Patient will benefit from skilled therapeutic intervention in order to improve the following deficits and impairments:  Decreased range of motion, Impaired UE functional use, Pain, Decreased knowledge of precautions, Postural dysfunction, Increased fascial restricitons, Increased muscle spasms, Decreased endurance, Impaired perceived functional ability, Decreased strength, Increased edema, Decreased mobility, Impaired sensation  Visit Diagnosis: Abnormal posture  Malignant neoplasm of lower-outer quadrant of right breast of female, estrogen receptor positive (HCC)  Stiffness of right shoulder, not elsewhere classified  Difficulty in walking, not elsewhere classified  Muscle weakness (generalized)  Lymphedema, not elsewhere classified  Disorder of the skin and subcutaneous tissue related to radiation, unspecified     Problem List Patient Active Problem List   Diagnosis Date Noted  . Muscle cramps 05/12/2019  .  Cramps, muscle, general 04/23/2019  . Tinnitus 04/16/2019  . Chest pain of uncertain etiology 66/44/0347  . Bilateral leg edema 02/10/2019  . Port-A-Cath in place 01/30/2019  . Anemia, iron deficiency 11/21/2018  . Malignant neoplasm of upper-outer quadrant of right breast in female,  estrogen receptor positive ([place]) 11/17/2018  . Itching 10/13/2018  . Left eye pain 07/01/2018  . Constipation 07/01/2018  . Weight loss 07/22/2017  . Memory loss 12/21/2016  . Tremor 12/21/2016  . Right sided temporal headache 12/21/2016  . Trochanteric bursitis, right hip 10/26/2016  . Pruritus 10/26/2016  . Foot sprain, left, initial encounter 08/31/2016  . Pain of right side of body 08/31/2016  . Headache 04/24/2016  . LLQ pain 04/24/2016  . Glossitis 02/02/2016  . Routine general medical examination at a health care facility 10/27/2015  . Facial pain 10/06/2015  . Trigeminal neuralgia of right side of face 11/30/2010  . Morbid obesity ([place]) 11/09/2010  . Fibromyalgia 11/09/2010  . HEMATURIA, HX OF 11/14/2009  . LOC OSTEOARTHROS NOT SPEC PRIM/SEC OTH [facility] SITE 06/27/2009  . Allergic rhinitis 08/26/2008  . 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

## 2019-08-19 NOTE — Patient Instructions (Signed)
Access Code: [email or web address]  URL: [email or web address]  Date: 08/19/2019  Prepared by: Tomma Rakers   Exercises Hooklying Clamshell with Resistance - 20 reps - 1 sets - 2x daily - 7x weekly Supine Piriformis Stretch with Foot on Ground - 2 reps - 1 sets - 20 seconds hold - 1x daily - 7x weekly Heel rises with counter support - 20 reps - 1 sets - 2x daily - 7x weekly Toe Raises with Counter Support - 20 reps - 1 sets - 2x daily - 7x weekly

## 2019-08-24 ENCOUNTER — Ambulatory Visit: Payer: Federal, State, Local not specified - PPO | Admitting: Physical Therapy

## 2019-08-24 ENCOUNTER — Encounter: Payer: Self-pay | Admitting: Physical Therapy

## 2019-08-24 ENCOUNTER — Other Ambulatory Visit: Payer: Self-pay

## 2019-08-24 DIAGNOSIS — L599 Disorder of the skin and subcutaneous tissue related to radiation, unspecified: Secondary | ICD-10-CM | POA: Diagnosis not present

## 2019-08-24 DIAGNOSIS — R293 Abnormal posture: Secondary | ICD-10-CM

## 2019-08-24 DIAGNOSIS — M6281 Muscle weakness (generalized): Secondary | ICD-10-CM

## 2019-08-24 DIAGNOSIS — R262 Difficulty in walking, not elsewhere classified: Secondary | ICD-10-CM

## 2019-08-24 DIAGNOSIS — I890 Lymphedema, not elsewhere classified: Secondary | ICD-10-CM

## 2019-08-24 DIAGNOSIS — M25611 Stiffness of right shoulder, not elsewhere classified: Secondary | ICD-10-CM

## 2019-08-24 NOTE — Patient Instructions (Signed)
[email or web address] Categories Active older adults or tai chi

## 2019-08-24 NOTE — Therapy (Signed)
[place], [place], [address] Phone: [phone]   Fax:  [phone]  Physical Therapy Treatment  Patient Details  Name: Summer [name] MRN: [id number] Date of Birth: 22-Sep-1950 Referring Provider (PT): Dr. Sondra Come    Encounter Date: 08/24/2019  PT End of Session - 08/24/19 0855    Visit Number  6    Number of Visits  9    Date for PT Re-Evaluation  09/02/19    PT Start Time  0805    PT Stop Time  0850    PT Time Calculation (min)  45 min    Activity Tolerance  Patient tolerated treatment well    Behavior During Therapy  [facility] for tasks assessed/performed       Past Medical History:  Diagnosis Date  . Abdominal pain, left lower quadrant 11/09/2009   Qualifier: Diagnosis of  By: Valma Cava LPN, Izora Gala    . Allergic rhinitis 08/26/2008   Qualifier: Diagnosis of  By: Sherren Mocha MD, Jory Ee   . Anemia, iron deficiency 11/21/2018  . Arthritis shoulders and back  . BACK PAIN, CHRONIC 06/13/2007   Qualifier: Diagnosis of  By: Sherren Mocha MD, Jory Ee   . 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 Constipation 07/01/2018  . Costochondritis 05/04/2014  . Diverticulitis of colon 06/13/2007   S/p segmental colectomy for perforated diverticulitis   . Diverticulosis   . Facial pain 10/06/2015  . Fibromyalgia   . Foot sprain, left, initial encounter 08/31/2016  . Glossitis 02/02/2016  . Headache 04/24/2016  . HEMATURIA UNSPECIFIED 06/25/2008   Qualifier: Diagnosis of  By: Sherren Mocha MD, Jory Ee   . HEMATURIA, HX OF 11/14/2009   Qualifier: Diagnosis of  By: Regino Schultze CMA (AAMA), Apolonio Schneiders    . History of kidney stones   . History of kidney stones   . History of shingles 2012--  no residual pain  . Itching 10/13/2018  . Left eye pain 07/01/2018  . Left ureteral calculus   . LLQ pain 04/24/2016  . LOC OSTEOARTHROS NOT SPEC PRIM/SEC OTH [facility] SITE 06/27/2009   Qualifier: Diagnosis of   By: Sherren Mocha MD, Jory Ee   . Malignant neoplasm of upper-outer quadrant of right breast in female, estrogen receptor positive ([place]) 11/17/2018  . MCI (mild cognitive impairment)   . Memory loss 12/21/2016  . Morbid obesity ([place]) 11/09/2010  . Pain of right side of body 08/31/2016  . Pruritus 10/26/2016  . Right sided temporal headache 12/21/2016  . Tremor 12/21/2016  . Trigeminal neuralgia RIGHT  . Trigeminal neuralgia of right side of face 11/30/2010  . Trochanteric bursitis, right hip 10/26/2016  . Urgency of urination   . Weight loss 07/22/2017  . Zoster without complications 2/37/6283    Past Surgical History:  Procedure Laterality Date  . BREAST LUMPECTOMY WITH RADIOACTIVE SEED AND SENTINEL LYMPH NODE BIOPSY Right 11/28/2018   Procedure: RIGHT BREAST LUMPECTOMY WITH RADIOACTIVE SEED AND RIGHT AXILLARY SENTINEL LYMPH NODE BIOPSY;  Surgeon: Excell Seltzer, MD;  Location: [place];  Service: General;  Laterality: Right;  . CEREBRAL MICROVASCULAR DECOMPRESSION  07-05-2006   RIGHT TRIGEMINAL NERVE  . CYSTOSCOPY/RETROGRADE/URETEROSCOPY/STONE EXTRACTION WITH BASKET  07/04/2012   Procedure: CYSTOSCOPY/RETROGRADE/URETEROSCOPY/STONE EXTRACTION WITH BASKET;  Surgeon: Claybon Jabs, MD;  Location: [facility];  Service: Urology;  Laterality: Left;  . EXPLORATORY LAPAROTOMY/ RESECTION MID TO DISTAL SIGMOID AND PROXIMAL RECTUM/ END PROXIMAL SIGMOID COLOSTOMY  07-17-2006   PERFORATED  DIVERTICULITIS WITH PERITONITIS  . gamma knife  05/17/2016   for trigeminal neuralgia, [facility], Dr Salomon Fick  . gamma knife  2019  . PORTACATH PLACEMENT Left 11/28/2018   Procedure: INSERTION PORT-A-CATH WITH ULTRASOUND;  Surgeon: Excell Seltzer, MD;  Location: [place];  Service: General;  Laterality: Left;  . RESECTION COLOSTOMY/ CLOSURE COLOSTOMY WITH COLOPROCTOSTOMY  02-20-2007  . TONSILLECTOMY  AS CHILD  . URETEROSCOPY  07/04/2012   Procedure: URETEROSCOPY;  Surgeon: Claybon Jabs, MD;  Location:  [facility];  Service: Urology;  Laterality: Left;  Marland Kitchen VAGINAL HYSTERECTOMY  1998   Partial  . WISDOM TOOTH EXTRACTION      There were no vitals filed for this visit.  Subjective Assessment - 08/24/19 0811    Subjective  Pt says she going for her compression bra this week. She feels like she is doing her manual lymph drainage fine and the symptoms in her right breast are improving. 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 It measures 1 cm and is located in the lower outer quadrant. It is triple negative with a Ki67 of 20%. She also has trigeminal neuralgia.  Pt returns ton 08/03/2019 after lumpectomy ( March 20 with 2 sentinel nodes by Dr. Excell Seltzer) and radiation( Dr. Sondra Come)  and continues with chemotherapy ( Dr. Burr Medico) until next April. ( She gets a treatment every 3 weeks) She has started to experience  neuropathy in her hands and feet and is being treated by her nueurologist Dr. Krista Blue for that. She is sent her by Dr. Sondra Come for breast lymphedmea                       [facility] Adult PT Treatment/Exercise - 08/24/19 0001      Exercises   Exercises  Neck;Shoulder;Lumbar;Other Exercises    Other Exercises   gave pt link for [email or web address] for active older adults and tai chi       Neck Exercises: Seated   Other Seated Exercise  neck and thoracici ROM 5 reps in each direction       Lumbar Exercises: Stretches   Lower Trunk Rotation  5 reps      Lumbar Exercises: Standing   Other Standing Lumbar Exercises  feet parrallel, weight shift back and forth and side to side and circle, heel drops  also staggered stance back and forth       Shoulder Exercises: Supine   Protraction  AROM;Right;Left;10 reps    Protraction Limitations  with dowel     Other Supine Exercises  manual resistance for midrange diagonal strengthening x 10  reps       Shoulder Exercises: Standing   Row  Strengthening;Right;Left;10 reps;Theraband   bilateral and unilateral with cues for core stability    Theraband Level (Shoulder Row)  Level 1 (Yellow)      Manual Therapy   Manual therapy comments  pt with point tenderness at pec area     Passive ROM  PROM to right shoulder in all directions              PT Education - 08/24/19 0855    Education Details  link for [email or web address] encouraged home exercise and walking.  Also standing break every hour at work with weight shift    Person(s) Educated  Patient    Methods  Explanation;Handout;Demonstration  Comprehension  Verbalized understanding          PT Long Term Goals - 08/03/19 1042      PT LONG TERM GOAL #1   Title  Patient will demonstrate she has regained full shoulder ROM and function post operaitvely compared to baselines.    Time  4    Period  Weeks    Status  New      PT LONG TERM GOAL #2   Title  Pt will report she can manage right breast lymphedema with self MLD and use of compression    Time  4    Period  Weeks    Status  New      PT LONG TERM GOAL #3   Title  Pt will be independent in a home exercise program for ROM and strength    Time  4    Period  Weeks    Status  New      PT LONG TERM GOAL #4   Title  Pt will reduce Quick DASH to < 35 indicating and improvment in function of arms    Baseline  63.64 on 08/03/2019    Time  4    Period  Weeks    Status  New      PT LONG TERM GOAL #5   Title  Pt will increase # of reps of sit to stand in 30 seconds to 10  indicating an improvement in general strength    Time  4    Period  Weeks    Status  New            Plan - 08/24/19 2878    Clinical Impression Statement  Pt reports she is doing well with MLD and plans to get her compression bra this week.  She has tenderness at pec major and minor with painful extreme of external rotation.  She had tighness in her spine and general body aches. Worked on  AROM and began strengthening to scapular muscles.    Comorbidities  previous surgery and radiation, ongoing chemo with CIPN symptoms    PT Frequency  2x / week    PT Duration  4 weeks    PT Treatment/Interventions  ADLs/Self Care Home Management;Patient/family education;Therapeutic exercise;Manual techniques;Passive range of motion;Manual lymph drainage    PT Next Visit Plan  Assess compression bra when pt gets it and add foam to pec area if needed.  teach supine scap series and add isometrics to shoulder for strength. when shoulder is less irritable, Assess STM tothe piriformis and lower legs and new HEP , begin heel raises and pelvic stabiliation exercises, need to move on to balance and strength for neuropathy soon? see how pt feels about status    PT Home Exercise Plan  Post op shoulder ROM HEP, script request sent to Bary Castilla to send to [facility], Access Code: [email or web address]       Patient will benefit from skilled therapeutic intervention in order to improve the following deficits and impairments:  Decreased range of motion, Impaired UE functional use, Pain, Decreased knowledge of precautions, Postural dysfunction, Increased fascial restricitons, Increased muscle spasms, Decreased endurance, Impaired perceived functional ability, Decreased strength, Increased edema, Decreased mobility, Impaired sensation  Visit Diagnosis: Abnormal posture  Stiffness of right shoulder, not elsewhere classified  Muscle weakness (generalized)  Difficulty in walking, not elsewhere classified  Lymphedema, not elsewhere classified  Disorder of the skin and subcutaneous tissue related to radiation, unspecified  Problem List Patient Active Problem List   Diagnosis Date Noted  . Muscle cramps 05/12/2019  . Cramps, muscle, general 04/23/2019  . Tinnitus 04/16/2019  . Chest pain of uncertain etiology 46/65/9935  . Bilateral leg edema 02/10/2019  . Port-A-Cath in place 01/30/2019  . Anemia, iron  deficiency 11/21/2018  . Malignant neoplasm of upper-outer quadrant of right breast in female, estrogen receptor positive ([place]) 11/17/2018  . Itching 10/13/2018  . Left eye pain 07/01/2018  . Constipation 07/01/2018  . Weight loss 07/22/2017  . Memory loss 12/21/2016  . Tremor 12/21/2016  . Right sided temporal headache 12/21/2016  . Trochanteric bursitis, right hip 10/26/2016  . Pruritus 10/26/2016  . Foot sprain, left, initial encounter 08/31/2016  . Pain of right side of body 08/31/2016  . Headache 04/24/2016  . LLQ pain 04/24/2016  . Glossitis 02/02/2016  . Routine general medical examination at a health care facility 10/27/2015  . Facial pain 10/06/2015  . Trigeminal neuralgia of right side of face 11/30/2010  . Morbid obesity ([place]) 11/09/2010  . Fibromyalgia 11/09/2010  . HEMATURIA, HX OF 11/14/2009  . LOC OSTEOARTHROS NOT SPEC PRIM/SEC OTH [facility] SITE 06/27/2009  . Allergic rhinitis 08/26/2008  . 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 Owens Shark PT  Norwood Levo 08/24/2019, 9:03 AM  [facility], [place], [address] Phone: [phone]   Fax:  [phone]  Name: Rolande Moe MRN: [id number] Date of Birth: 01-Jul-1951

## 2019-08-27 ENCOUNTER — Other Ambulatory Visit: Payer: Self-pay | Admitting: Neurology

## 2019-08-27 NOTE — Progress Notes (Signed)
[place]   [phone] [phone]   Clinic Follow up Note   Patient Care Team: Summer Koch, MD as PCP - General (Internal Medicine) Summer Margarita, MD as PCP - Cardiology (Cardiology) Summer Kaufmann, RN as Oncology Nurse Navigator Summer Germany, RN as Oncology Nurse Navigator Summer Seltzer, MD (Inactive) as Consulting Physician (General Surgery) Summer Merle, MD as Consulting Physician (Hematology) Summer Pray, MD as Consulting Physician (Radiation Oncology)  Date of Service:  08/28/2019  CHIEF COMPLAINT: F/u of right breast cancer  SUMMARY OF ONCOLOGIC HISTORY: Oncology History Overview Note  Cancer Staging Malignant neoplasm of upper-outer quadrant of right breast in female, estrogen receptor positive ([place]) Staging form: Breast, AJCC 8th Edition - Clinical stage from 11/10/2018: Stage IA (cT1b, cN0, cM0, G2, ER+, PR+, HER2+) - Signed by Summer Merle, MD on 11/18/2018     Malignant neoplasm of upper-outer quadrant of right breast in female, estrogen receptor positive ([place])  11/07/2018 Mammogram   Diagnostic 11/07/18 IMPRESSION: 1. 1 x 0.7 x 0.6 cm hypoechoic mass with distortion at the 9-9:30 position of the RIGHT breast 6 cm from the nipple with distortion in the UPPER-OUTER RIGHT breast, corresponding to the mammographic abnormality. One RIGHT axillary lymph node with borderline cortical thickness. Tissue sampling of the RIGHT breast mass and borderline RIGHT axillary lymph node recommended. ADDENDUM: Patient returned today for biopsy of a single borderline thickened lymph node in the RIGHT axilla. I could not reproduce a morphologically abnormal lymph node today, possibly resolved in the interval. Today, only normal appearing lymph nodes were identified in the RIGHT axilla. As such, the axillary ultrasound-guided biopsy was canceled.   11/10/2018 Cancer Staging   Staging form: Breast, AJCC 8th Edition - Clinical stage  from 11/10/2018: Stage IA (cT1b, cN0, cM0, G2, ER+, PR+, HER2+) - Signed by Summer Merle, MD on 11/18/2018   11/11/2018 Initial Biopsy   Diagnosis 11/11/18 Breast, right, needle core biopsy, upper outer - 9:30 o'clock position - INVASIVE DUCTAL CARCINOMA, GRADE II/III. - SEE MICROSCOPIC DESCRIPTION.   11/11/2018 Receptors her2   Results: IMMUNOHISTOCHEMICAL AND MORPHOMETRIC ANALYSIS PERFORMED MANUALLY The tumor cells are POSITIVE for Her2 (3+). Estrogen Receptor: 95%, POSITIVE, STRONG STAINING INTENSITY Progesterone Receptor: 5%, POSITIVE, STRONG STAINING INTENSITY Proliferation Marker Ki67: 20%   11/17/2018 Initial Diagnosis   Malignant neoplasm of upper-outer quadrant of right breast in female, estrogen receptor positive ([place])   11/28/2018 Surgery   RIGHT BREAST LUMPECTOMY WITH RADIOACTIVE SEED AND RIGHT AXILLARY SENTINEL LYMPH NODE BIOPSY by Dr. Excell [name]  11/28/18    11/28/2018 Pathology Results   Diagnosis 11/28/18  1. Breast, lumpectomy, Right w/seed - INVASIVE DUCTAL CARCINOMA, 1.6 CM, [place] GRADE 2 OF 3. - MARGINS OF RESECTION ARE NOT INVOLVED (CLOSEST MARGIN: LESS THAN 1 MM, ANTERIOR). - DUCTAL CARCINOMA IN SITU. - BIOPSY SITE CHANGES. - SEE ONCOLOGY TABLE. 2. Lymph node, sentinel, biopsy, right Axillary - ONE LYMPH NODE, NEGATIVE FOR CARCINOMA (0/1). 3. Lymph node, sentinel, biopsy, right Axillary - ONE LYMPH NODE, NEGATIVE FOR CARCINOMA (0/1).   11/28/2018 Cancer Staging   Staging form: Breast, AJCC 8th Edition - Pathologic stage from 11/28/2018: Stage IA (pT1c, pN0, cM0, G2, ER+, PR+, HER2+) - Signed by Summer Merle, MD on 12/11/2018   12/22/2018 Procedure   Baseline ECHO 12/22/18  IMPRESSIONS  1. The left ventricle has normal systolic function with an ejection fraction of 60-65%. The cavity size was normal. There is mild concentric left ventricular hypertrophy. Left ventricular diastolic parameters were normal.  2. The right ventricle has normal systolic function. The cavity was  normal. There is no increase in right ventricular wall thickness.  3. Left atrial size was moderately dilated.  4. The aortic valve is tricuspid. Aortic valve regurgitation was not assessed by color flow Doppler.    01/02/2019 -  Chemotherapy   Adjuvant chemo weekly Taxol with Herceptin starting 01/02/19. Will d/c after 02/13/19 and will switch to Kadcyla q3weeks starting 02/20/19 due to worsening neuropathy. Due to worsening Muscle cramps Kadcyla stopped 04/03/19 and switched to Herceptin q3weeks on 04/23/19.    04/09/2019 -  Radiation Therapy   RT with Dr. Sondra [name] 04/09/19-05/28/19   06/29/2019 -  Anti-estrogen oral therapy   Exemestane 90m daily starting 06/29/19      CURRENT THERAPY:  -Adjuvant chemo weekly Taxol with Herceptin starting 01/02/19. Will d/c after 02/13/19 and will switch to Kadcyla q3weeks starting 6/12/20due to worsening neuropathy. Due to worsening Muscle cramps will switch Kadcyla to Herceptin on 04/23/19 -IV iron as needed -Exemestane 279mdaily starting 06/29/19   INTERVAL HISTORY:  [name] here for a follow up and maintenance treatment. She presents to the clinic alone. She notes she is doing fairly well. She notes she is taking Exemestane. She notes b/l knee pain and walking has been painful. She also notes extreme pain in her b/l shoulders. She notes her pain occurs a few times a week and has stiffness in the morning. She notes she has had arthritis before exemestane mainly in her knees and hips. She feels her current joint pain can flare up to a 10/10, mostly at night or with high activity. She notes her shoulder pain started after she started exemestane. She is willing to continue Exemestane for now.  She notes yesterday she felt lateral right breast nodule or thickness. She also notes she feels mild burning or soreness in her PAC. No redness or swelling.    REVIEW OF SYSTEMS:   Constitutional: Denies fevers, chills or abnormal weight loss Eyes:  Denies blurriness of vision Ears, nose, mouth, throat, and face: Denies mucositis or sore throat Respiratory: Denies cough, dyspnea or wheezes Cardiovascular: Denies palpitation, chest discomfort or lower extremity swelling Gastrointestinal:  Denies nausea, heartburn or change in bowel habits Skin: Denies abnormal skin rashes MSK: (+) B/l knee and shoulder joint pain intermittent  Lymphatics: Denies new lymphadenopathy or easy bruising Neurological:Denies numbness, tingling or new weaknesses Behavioral/Psych: Mood is stable, no new changes  Breast: (+) Lateral right breast thickness All other systems were reviewed with the patient and are negative.  MEDICAL HISTORY:  Past Medical History:  Diagnosis Date  . Abdominal pain, left lower quadrant 11/09/2009   Qualifier: Diagnosis of  By: NeValma CavaPN, NaIzora Gala  . Allergic rhinitis 08/26/2008   Qualifier: Diagnosis of  By: ToSherren MochaD, JeJory Ee . Anemia, iron deficiency 11/21/2018  . Arthritis shoulders and back  . BACK PAIN, CHRONIC 06/13/2007   Qualifier: Diagnosis of  By: ToSherren MochaD, JeJory Ee . 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 Constipation 07/01/2018  . Costochondritis 05/04/2014  . Diverticulitis of colon 06/13/2007   S/p segmental colectomy for perforated diverticulitis   . Diverticulosis   . Facial pain 10/06/2015  . Fibromyalgia   . Foot sprain, left, initial encounter 08/31/2016  . Glossitis 02/02/2016  . Headache 04/24/2016  . HEMATURIA UNSPECIFIED 06/25/2008   Qualifier: Diagnosis of  By: ToSherren MochaD, JeJory Ee .  HEMATURIA, HX OF 11/14/2009   Qualifier: Diagnosis of  By: Regino Schultze CMA (AAMA), Apolonio Schneiders    . History of kidney stones   . History of kidney stones   . History of shingles 2012--  no residual pain  . Itching 10/13/2018  . Left eye pain 07/01/2018  . Left ureteral calculus   . LLQ pain 04/24/2016  . LOC OSTEOARTHROS NOT SPEC PRIM/SEC OTH [facility] SITE 06/27/2009   Qualifier:  Diagnosis of  By: Sherren Mocha MD, Jory Ee   . Malignant neoplasm of upper-outer quadrant of right breast in female, estrogen receptor positive ([place]) 11/17/2018  . MCI (mild cognitive impairment)   . Memory loss 12/21/2016  . Morbid obesity ([place]) 11/09/2010  . Pain of right side of body 08/31/2016  . Pruritus 10/26/2016  . Right sided temporal headache 12/21/2016  . Tremor 12/21/2016  . Trigeminal neuralgia RIGHT  . Trigeminal neuralgia of right side of face 11/30/2010  . Trochanteric bursitis, right hip 10/26/2016  . Urgency of urination   . Weight loss 07/22/2017  . Zoster without complications 7/89/3810    SURGICAL HISTORY: Past Surgical History:  Procedure Laterality Date  . BREAST LUMPECTOMY WITH RADIOACTIVE SEED AND SENTINEL LYMPH NODE BIOPSY Right 11/28/2018   Procedure: RIGHT BREAST LUMPECTOMY WITH RADIOACTIVE SEED AND RIGHT AXILLARY SENTINEL LYMPH NODE BIOPSY;  Surgeon: Summer Seltzer, MD;  Location: [facility];  Service: General;  Laterality: Right;  . CEREBRAL MICROVASCULAR DECOMPRESSION  07-05-2006   RIGHT TRIGEMINAL NERVE  . CYSTOSCOPY/RETROGRADE/URETEROSCOPY/STONE EXTRACTION WITH BASKET  07/04/2012   Procedure: CYSTOSCOPY/RETROGRADE/URETEROSCOPY/STONE EXTRACTION WITH BASKET;  Surgeon: Claybon Jabs, MD;  Location: [facility];  Service: Urology;  Laterality: Left;  . EXPLORATORY LAPAROTOMY/ RESECTION MID TO DISTAL SIGMOID AND PROXIMAL RECTUM/ END PROXIMAL SIGMOID COLOSTOMY  07-17-2006   PERFORATED DIVERTICULITIS WITH PERITONITIS  . gamma knife  05/17/2016   for trigeminal neuralgia, [facility], Dr Salomon Fick  . gamma knife  2019  . PORTACATH PLACEMENT Left 11/28/2018   Procedure: INSERTION PORT-A-CATH WITH ULTRASOUND;  Surgeon: Summer Seltzer, MD;  Location: [facility];  Service: General;  Laterality: Left;  . RESECTION COLOSTOMY/ CLOSURE COLOSTOMY WITH COLOPROCTOSTOMY  02-20-2007  . TONSILLECTOMY  AS CHILD  . URETEROSCOPY  07/04/2012   Procedure: URETEROSCOPY;  Surgeon:  Claybon Jabs, MD;  Location: [facility];  Service: Urology;  Laterality: Left;  Marland Kitchen VAGINAL HYSTERECTOMY  1998   Partial  . WISDOM TOOTH EXTRACTION      I have reviewed the social history and family history with the patient and they are unchanged from previous note.  ALLERGIES:  is allergic to clindamycin hcl.  MEDICATIONS:  Current Outpatient Medications  Medication Sig Dispense Refill  . acetaminophen (TYLENOL) 500 MG tablet Take 1,000 mg by mouth every 6 (six) hours as needed.    . diclofenac Sodium (VOLTAREN) 1 % GEL Apply 2 g topically 4 (four) times daily. 100 g 5  . DULoxetine (CYMBALTA) 60 MG capsule TAKE 1 CAPSULE BY MOUTH EVERY DAY 90 capsule 3  . exemestane (AROMASIN) 25 MG tablet Take 1 tablet (25 mg total) by mouth daily after breakfast. 30 tablet 3  . gabapentin (NEURONTIN) 600 MG tablet TAKE 2 TABLETS TID. 540 tablet 3  . lidocaine-prilocaine (EMLA) cream Apply to affected area once 30 g 3  . triamcinolone ointment (KENALOG) 0.1 % Apply 1 application topically 3 (three) times daily. 90 g 0   No current facility-administered medications for this visit.   Facility-Administered Medications Ordered in Other Visits  Medication Dose Route Frequency Provider Last Rate Last Admin  . sodium chloride flush (NS) 0.9 % injection 10 mL  10 mL Intracatheter PRN Summer Merle, MD   10 mL at 08/28/19 1218    PHYSICAL EXAMINATION: ECOG PERFORMANCE STATUS: 1 - Symptomatic but completely ambulatory  Vitals:   08/28/19 0951  BP: (!) 153/67  Pulse: 76  Resp: 18  Temp: 98 F (36.7 C)  SpO2: 90%   Filed Weights   08/28/19 0951  Weight: 191 lb 9.6 oz (86.9 kg)    GENERAL:alert, no distress and comfortable SKIN: skin color, texture, turgor are normal, no rashes or significant lesions EYES: normal, Conjunctiva are pink and non-injected, sclera clear  NECK: supple, thyroid normal size, non-tender, without nodularity LYMPH:  no palpable lymphadenopathy in the cervical,  axillary  LUNGS: clear to auscultation and percussion with normal breathing effort HEART: regular rate & rhythm and no murmurs and no lower extremity edema ABDOMEN:abdomen soft, non-tender and normal bowel sounds Musculoskeletal:no cyanosis of digits and no clubbing  NEURO: alert & oriented x 3 with fluent speech, no focal motor/sensory deficits BREAST: S/p right breast lumpectomy: Surgical incision healed well (+) Right breast lymphedema and skin hyperpigmentation (+)skin thickness below right axillary incision, likely scar tissue. No palpable mass, nodules bilaterally. Breast exam benign.   LABORATORY DATA:  I have reviewed the data as listed CBC Latest Ref Rng & Units 08/28/2019 08/07/2019 07/17/2019  WBC 4.0 - 10.5 K/uL 4.8 4.6 4.2  Hemoglobin 12.0 - 15.0 g/dL 12.3 12.4 12.5  Hematocrit 36.0 - 46.0 % 39.1 39.4 39.5  Platelets 150 - 400 K/uL 206 202 174     CMP Latest Ref Rng & Units 08/28/2019 08/07/2019 07/17/2019  Glucose 70 - 99 mg/dL 92 140(H) 102(H)  BUN 8 - 23 mg/dL 12 13 12   Creatinine 0.44 - 1.00 mg/dL 0.73 0.78 0.74  Sodium 135 - 145 mmol/L 141 140 141  Potassium 3.5 - 5.1 mmol/L 4.1 3.7 3.7  Chloride 98 - 111 mmol/L 105 104 104  CO2 22 - 32 mmol/L 29 26 28   Calcium 8.9 - 10.3 mg/dL 8.6(L) 8.6(L) 8.8(L)  Total Protein 6.5 - 8.1 g/dL 6.5 6.6 6.3(L)  Total Bilirubin 0.3 - 1.2 mg/dL 0.8 1.4(H) 0.7  Alkaline Phos 38 - 126 U/L 110 101 91  AST 15 - 41 U/L 19 21 18   ALT 0 - 44 U/L 12 15 11       RADIOGRAPHIC STUDIES: I have personally reviewed the radiological images as listed and agreed with the findings in the report. No results found.   ASSESSMENT & PLAN:  Chassidy Layson is a 68 y.o. female with   1.Malignant neoplasm of upper-outer quadrant of right breast, StageIA,pT1b,N0,M0, ER/PR+, HER2+, GradeII -She was diagnosed in 11/2018. She is s/p right breastlumpectomyand SLNB as of 11/28/18 and adjuvant radiation.  -She startedadjuvantweekly Taxol with  Herceptinon4/24/20butstopped after 6 weeks therapydue to worsening neuropathyand infusion reaction. She also tried Kadcyla but due to worsening muscle cramps she was changed to Herceptin injections q3weeks starting 04/23/19. If she tolerates we will continueanti-her2 treatmentuntil 12/2019. -I started her on antiestrogen therapy with Exemestane in 06/2019. She has tolerated moderately due to known joint pain worsening in shoulders and knees. She is willing to continue for now.  If intolerable I recommend she switch to Tamoxifen.   -She is otherwise clinically doing well. Her repeat Echo is adequate and normal. Physical exam shows right breast lymphedema and scar tissue. Labs reviewed and adequate to proceed Herceptin  today. Continue every 3 weeks.  -continue PT for her Lymphedema.  -Continue Exemestane -Next Mammogram in 11/2019, Next ECHO in 10/2019  -F/u in 9 weeks   2. Significant Muscle Cramps, Diffuse body pain, joint pain/arthritis  possible related to chemo, improved lately  -She has been seen by her neurologist Dr Krista Blue who increased her Gabapentin to 672m 7 tabs a day. She was to have nerve study in 07/2019 -She does have arthritis progression due to Exemestane with b/l knee and shoulder pain. I recommend she start OTC Glucosamine and increase exercise to help her pain.   3. Anemia,iron deficiency -Last colonoscopy was in 04/2012 which wasunremarkable except diverticulosis.  -She had partial colectomy in the past due to severe diverticulosis. -This is possiblyiron deficiency from diverticulosis.She denies any current bleeding.  -After COVID-19 improvesand her chemo treatmentI will refer herbacktoDr. Gessnerfor colonoscopy -Her workup showed iron 17, ferritin 4, normal retic ct, folate Vitamin B12 and SPEP panels. -IV iron was given 3/18/20and 12/12/18. Has been resolved lately   4. Trigeminal neuralgia  -5/26/20brain and neck MRIwere stable. -She has been having  moderate right side HA which I suspect is exacerbation of her neuralgia secondary to chemo. -Has not been completely controlled but managing with occasionally worsened flares.  -She has oxycodone which she can use for significant pain. Otherwise she is on Gabapentin 6056m7 tabs a day.   5.PeripheralNeuropathy, G1-2 -Secondary to chemoTaxolstarted aftercycle 3. -Taxol switched toTDM-1 KaDemetrius Charity/12/20which was switched to Herceptin on 04/23/19 -On Robaxin, B12 and Gabapentin.  -Her neuropathyhas overall improved in her feet, with still has significant numbness of 2 left toes  and tinglingin her fingertips.  6. Osteopenia  -Her baseline DEXA from 07/2019 shows osteopenia with lowest T-score -1.1 at left hip.  -Will continue to monitor on AI as this can weaken her bones. Repeat every 2 years.  -I recommend she start OTC calcium and Vitamin D.     PLAN: -Labs reviewed andadequateto proceed withHerceptintoday -Continue exemestane, will watch her arthralgia  -Lab, flush, andHerceptinin 3, 6 and 9 weeks -F/u in 9 weeks  -I answered all her questions to her understanding and satisfaction.  -continue PT for Lymphedema  -ECHO in 10/2019  -Mammogram in 11/2019   No problem-specific Assessment & Plan notes found for this encounter.   Orders Placed This Encounter  Procedures  . MM DIAG BREAST TOMO BILATERAL    Standing Status:   Future    Standing Expiration Date:   08/27/2020    Order Specific Question:   Reason for Exam (SYMPTOM  OR DIAGNOSIS REQUIRED)    Answer:   screening    Order Specific Question:   [facility]    [facility]    Standing Status:   Future    Standing Expiration Date:   11/25/2020    Order Specific Question:   Where [place] this [place]    [place]   [facility]  Order Specific Question:   Perflutren DEFINITY (image enhancing agent) should be administered unless  hypersensitivity or allergy exist    Answer:   Administer Perflutren    Order Specific Question:   Is a special reader required? (athlete or structural heart)    Answer:   No    Order Specific Question:   Reason for exam-Echo    Answer:   Chemo  V67.2 / Z09   All questions were answered. The patient knows to call the clinic  with any problems, questions or concerns. No barriers to learning was detected. I spent 20 minutes counseling the patient face to face. The total time spent in the appointment was 25 minutes and more than 50% was on counseling and review of test results     Summer Merle, MD 08/28/2019   I, Joslyn Devon, am acting as scribe for Summer Merle, MD.   I have reviewed the above documentation for accuracy and completeness, and I agree with the above.

## 2019-08-28 ENCOUNTER — Other Ambulatory Visit: Payer: Self-pay

## 2019-08-28 ENCOUNTER — Encounter: Payer: Self-pay | Admitting: Hematology

## 2019-08-28 ENCOUNTER — Inpatient Hospital Stay: Payer: Federal, State, Local not specified - PPO

## 2019-08-28 ENCOUNTER — Inpatient Hospital Stay (HOSPITAL_BASED_OUTPATIENT_CLINIC_OR_DEPARTMENT_OTHER): Payer: Federal, State, Local not specified - PPO | Admitting: Hematology

## 2019-08-28 ENCOUNTER — Inpatient Hospital Stay: Payer: Federal, State, Local not specified - PPO | Attending: Hematology

## 2019-08-28 VITALS — BP 153/67 | HR 76 | Temp 98.0°F | Resp 18 | Ht 66.0 in | Wt 191.6 lb

## 2019-08-28 DIAGNOSIS — G500 Trigeminal neuralgia: Secondary | ICD-10-CM | POA: Diagnosis not present

## 2019-08-28 DIAGNOSIS — T451X5A Adverse effect of antineoplastic and immunosuppressive drugs, initial encounter: Secondary | ICD-10-CM | POA: Insufficient documentation

## 2019-08-28 DIAGNOSIS — Z9221 Personal history of antineoplastic chemotherapy: Secondary | ICD-10-CM | POA: Diagnosis not present

## 2019-08-28 DIAGNOSIS — Z79811 Long term (current) use of aromatase inhibitors: Secondary | ICD-10-CM | POA: Insufficient documentation

## 2019-08-28 DIAGNOSIS — C50411 Malignant neoplasm of upper-outer quadrant of right female breast: Secondary | ICD-10-CM | POA: Diagnosis present

## 2019-08-28 DIAGNOSIS — Z170 Estrogen receptor positive status [ER+]: Secondary | ICD-10-CM

## 2019-08-28 DIAGNOSIS — Z923 Personal history of irradiation: Secondary | ICD-10-CM | POA: Diagnosis not present

## 2019-08-28 DIAGNOSIS — G620 Drug-induced polyneuropathy: Secondary | ICD-10-CM | POA: Insufficient documentation

## 2019-08-28 DIAGNOSIS — Z5112 Encounter for antineoplastic immunotherapy: Secondary | ICD-10-CM | POA: Insufficient documentation

## 2019-08-28 DIAGNOSIS — M25519 Pain in unspecified shoulder: Secondary | ICD-10-CM | POA: Insufficient documentation

## 2019-08-28 DIAGNOSIS — Z79899 Other long term (current) drug therapy: Secondary | ICD-10-CM | POA: Diagnosis not present

## 2019-08-28 DIAGNOSIS — M797 Fibromyalgia: Secondary | ICD-10-CM | POA: Insufficient documentation

## 2019-08-28 DIAGNOSIS — D509 Iron deficiency anemia, unspecified: Secondary | ICD-10-CM

## 2019-08-28 DIAGNOSIS — M170 Bilateral primary osteoarthritis of knee: Secondary | ICD-10-CM | POA: Diagnosis not present

## 2019-08-28 DIAGNOSIS — M8580 Other specified disorders of bone density and structure, unspecified site: Secondary | ICD-10-CM | POA: Diagnosis not present

## 2019-08-28 DIAGNOSIS — Z95828 Presence of other vascular implants and grafts: Secondary | ICD-10-CM

## 2019-08-28 DIAGNOSIS — Z791 Long term (current) use of non-steroidal anti-inflammatories (NSAID): Secondary | ICD-10-CM | POA: Insufficient documentation

## 2019-08-28 LAB — CBC WITH DIFFERENTIAL (CANCER CENTER ONLY)
Abs Immature Granulocytes: 0.02 10*3/uL (ref 0.00–0.07)
Basophils Absolute: 0 10*3/uL (ref 0.0–0.1)
Basophils Relative: 1 %
Eosinophils Absolute: 0.2 10*3/uL (ref 0.0–0.5)
Eosinophils Relative: 4 %
HCT: 39.1 % (ref 36.0–46.0)
Hemoglobin: 12.3 g/dL (ref 12.0–15.0)
Immature Granulocytes: 0 %
Lymphocytes Relative: 26 %
Lymphs Abs: 1.3 10*3/uL (ref 0.7–4.0)
MCH: 29 pg (ref 26.0–34.0)
MCHC: 31.5 g/dL (ref 30.0–36.0)
MCV: 92.2 fL (ref 80.0–100.0)
Monocytes Absolute: 0.5 10*3/uL (ref 0.1–1.0)
Monocytes Relative: 11 %
Neutro Abs: 2.8 10*3/uL (ref 1.7–7.7)
Neutrophils Relative %: 58 %
Platelet Count: 206 10*3/uL (ref 150–400)
RBC: 4.24 MIL/uL (ref 3.87–5.11)
RDW: 13.7 % (ref 11.5–15.5)
WBC Count: 4.8 10*3/uL (ref 4.0–10.5)
nRBC: 0 % (ref 0.0–0.2)

## 2019-08-28 LAB — CMP (CANCER CENTER ONLY)
ALT: 12 U/L (ref 0–44)
AST: 19 U/L (ref 15–41)
Albumin: 3.3 g/dL — ABNORMAL LOW (ref 3.5–5.0)
Alkaline Phosphatase: 110 U/L (ref 38–126)
Anion gap: 7 (ref 5–15)
BUN: 12 mg/dL (ref 8–23)
CO2: 29 mmol/L (ref 22–32)
Calcium: 8.6 mg/dL — ABNORMAL LOW (ref 8.9–10.3)
Chloride: 105 mmol/L (ref 98–111)
Creatinine: 0.73 mg/dL (ref 0.44–1.00)
GFR, Est AFR Am: >60 mL/min (ref >60)
GFR, Estimated: >60 mL/min (ref >60)
Glucose, Bld: 92 mg/dL (ref 70–99)
Potassium: 4.1 mmol/L (ref 3.5–5.1)
Sodium: 141 mmol/L (ref 135–145)
Total Bilirubin: 0.8 mg/dL (ref 0.3–1.2)
Total Protein: 6.5 g/dL (ref 6.5–8.1)

## 2019-08-28 LAB — FERRITIN: Ferritin: 29 ng/mL (ref 11–307)

## 2019-08-28 MED ORDER — SODIUM CHLORIDE 0.9% FLUSH
10.0000 mL | Freq: Once | INTRAVENOUS | Status: AC
  Administered 2019-08-28: 09:00:00 10 mL
  Filled 2019-08-28: qty 10

## 2019-08-28 MED ORDER — TRASTUZUMAB-DKST CHEMO 150 MG IV SOLR
6.0000 mg/kg | Freq: Once | INTRAVENOUS | Status: AC
  Administered 2019-08-28: 504 mg via INTRAVENOUS
  Filled 2019-08-28: qty 24

## 2019-08-28 MED ORDER — SODIUM CHLORIDE 0.9 % IV SOLN
Freq: Once | INTRAVENOUS | Status: AC
  Filled 2019-08-28: qty 250

## 2019-08-28 MED ORDER — DIPHENHYDRAMINE HCL 25 MG PO CAPS
50.0000 mg | ORAL_CAPSULE | Freq: Once | ORAL | Status: AC
  Administered 2019-08-28: 11:00:00 50 mg via ORAL

## 2019-08-28 MED ORDER — ACETAMINOPHEN 325 MG PO TABS
650.0000 mg | ORAL_TABLET | Freq: Once | ORAL | Status: AC
  Administered 2019-08-28: 650 mg via ORAL

## 2019-08-28 MED ORDER — DIPHENHYDRAMINE HCL 25 MG PO CAPS
ORAL_CAPSULE | ORAL | Status: AC
  Filled 2019-08-28: qty 2

## 2019-08-28 MED ORDER — ACETAMINOPHEN 325 MG PO TABS
ORAL_TABLET | ORAL | Status: AC
  Filled 2019-08-28: qty 2

## 2019-08-28 MED ORDER — SODIUM CHLORIDE 0.9% FLUSH
10.0000 mL | INTRAVENOUS | Status: DC | PRN
  Administered 2019-08-28: 10 mL
  Filled 2019-08-28: qty 10

## 2019-08-28 MED ORDER — HEPARIN SOD (PORK) LOCK FLUSH 100 UNIT/ML IV SOLN
500.0000 [IU] | Freq: Once | INTRAVENOUS | Status: AC | PRN
  Administered 2019-08-28: 500 [IU]
  Filled 2019-08-28: qty 5

## 2019-08-28 NOTE — Patient Instructions (Signed)
[facility] Discharge Instructions for Patients Receiving Chemotherapy  Today you received the following chemotherapy agent: Ogivri   To help prevent nausea and vomiting after your treatment, we encourage you to take your nausea medication as directed.    If you develop nausea and vomiting that is not controlled by your nausea medication, call the clinic.   BELOW ARE SYMPTOMS THAT SHOULD BE REPORTED IMMEDIATELY:  *FEVER GREATER THAN 100.5 F  *CHILLS WITH OR WITHOUT FEVER  NAUSEA AND VOMITING THAT IS NOT CONTROLLED WITH YOUR NAUSEA MEDICATION  *UNUSUAL SHORTNESS OF BREATH  *UNUSUAL BRUISING OR BLEEDING  TENDERNESS IN MOUTH AND THROAT WITH OR WITHOUT PRESENCE OF ULCERS  *URINARY PROBLEMS  *BOWEL PROBLEMS  UNUSUAL RASH Items with * indicate a potential emergency and should be followed up as soon as possible.  Feel free to call the clinic should you have any questions or concerns. The clinic phone number is [phone].  Please show the CHEMO ALERT CARD at check-in to the Emergency Department and triage nurse.   

## 2019-08-28 NOTE — Progress Notes (Signed)
Patient complained of "itching" inside the port. She has complained about this numerous times prior and has seen symptom management as well. I told her to speak with Dr Burr Medico today about it today.

## 2019-08-28 NOTE — Patient Instructions (Signed)

## 2019-08-31 ENCOUNTER — Telehealth: Payer: Self-pay | Admitting: Hematology

## 2019-08-31 ENCOUNTER — Encounter: Payer: Self-pay | Admitting: Physical Therapy

## 2019-08-31 ENCOUNTER — Ambulatory Visit: Payer: Federal, State, Local not specified - PPO | Admitting: Physical Therapy

## 2019-08-31 ENCOUNTER — Other Ambulatory Visit: Payer: Self-pay

## 2019-08-31 DIAGNOSIS — L599 Disorder of the skin and subcutaneous tissue related to radiation, unspecified: Secondary | ICD-10-CM

## 2019-08-31 DIAGNOSIS — R293 Abnormal posture: Secondary | ICD-10-CM

## 2019-08-31 DIAGNOSIS — M25611 Stiffness of right shoulder, not elsewhere classified: Secondary | ICD-10-CM

## 2019-08-31 DIAGNOSIS — M6281 Muscle weakness (generalized): Secondary | ICD-10-CM

## 2019-08-31 NOTE — Patient Instructions (Signed)

## 2019-08-31 NOTE — Therapy (Signed)
[place], [place], [address] Phone: [phone]   Fax:  [phone]  Physical Therapy Treatment  Patient Details  Name: Summer [name] MRN: [id number] Date of Birth: 05-05-1951 Referring Provider (PT): Dr. Sondra Come    Encounter Date: 08/31/2019  PT End of Session - 08/31/19 1246    Visit Number  7    Number of Visits  9    Date for PT Re-Evaluation  09/02/19    PT Start Time  0800    PT Stop Time  0845    PT Time Calculation (min)  45 min    Activity Tolerance  Patient tolerated treatment well    Behavior During Therapy  [facility] for tasks assessed/performed       Past Medical History:  Diagnosis Date  . Abdominal pain, left lower quadrant 11/09/2009   Qualifier: Diagnosis of  By: Valma Cava LPN, Izora Gala    . Allergic rhinitis 08/26/2008   Qualifier: Diagnosis of  By: Sherren Mocha MD, Jory Ee   . Anemia, iron deficiency 11/21/2018  . Arthritis shoulders and back  . BACK PAIN, CHRONIC 06/13/2007   Qualifier: Diagnosis of  By: Sherren Mocha MD, Jory Ee   . 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 Constipation 07/01/2018  . Costochondritis 05/04/2014  . Diverticulitis of colon 06/13/2007   S/p segmental colectomy for perforated diverticulitis   . Diverticulosis   . Facial pain 10/06/2015  . Fibromyalgia   . Foot sprain, left, initial encounter 08/31/2016  . Glossitis 02/02/2016  . Headache 04/24/2016  . HEMATURIA UNSPECIFIED 06/25/2008   Qualifier: Diagnosis of  By: Sherren Mocha MD, Jory Ee   . HEMATURIA, HX OF 11/14/2009   Qualifier: Diagnosis of  By: Regino Schultze CMA (AAMA), Apolonio Schneiders    . History of kidney stones   . History of kidney stones   . History of shingles 2012--  no residual pain  . Itching 10/13/2018  . Left eye pain 07/01/2018  . Left ureteral calculus   . LLQ pain 04/24/2016  . LOC OSTEOARTHROS NOT SPEC PRIM/SEC OTH [facility] SITE 06/27/2009   Qualifier: Diagnosis of   By: Sherren Mocha MD, Jory Ee   . Malignant neoplasm of upper-outer quadrant of right breast in female, estrogen receptor positive ([place]) 11/17/2018  . MCI (mild cognitive impairment)   . Memory loss 12/21/2016  . Morbid obesity ([place]) 11/09/2010  . Pain of right side of body 08/31/2016  . Pruritus 10/26/2016  . Right sided temporal headache 12/21/2016  . Tremor 12/21/2016  . Trigeminal neuralgia RIGHT  . Trigeminal neuralgia of right side of face 11/30/2010  . Trochanteric bursitis, right hip 10/26/2016  . Urgency of urination   . Weight loss 07/22/2017  . Zoster without complications 1/70/0174    Past Surgical History:  Procedure Laterality Date  . BREAST LUMPECTOMY WITH RADIOACTIVE SEED AND SENTINEL LYMPH NODE BIOPSY Right 11/28/2018   Procedure: RIGHT BREAST LUMPECTOMY WITH RADIOACTIVE SEED AND RIGHT AXILLARY SENTINEL LYMPH NODE BIOPSY;  Surgeon: Excell Seltzer, MD;  Location: [facility];  Service: General;  Laterality: Right;  . CEREBRAL MICROVASCULAR DECOMPRESSION  07-05-2006   RIGHT TRIGEMINAL NERVE  . CYSTOSCOPY/RETROGRADE/URETEROSCOPY/STONE EXTRACTION WITH BASKET  07/04/2012   Procedure: CYSTOSCOPY/RETROGRADE/URETEROSCOPY/STONE EXTRACTION WITH BASKET;  Surgeon: Claybon Jabs, MD;  Location: [facility];  Service: Urology;  Laterality: Left;  . EXPLORATORY LAPAROTOMY/ RESECTION MID TO DISTAL SIGMOID AND PROXIMAL RECTUM/ END PROXIMAL SIGMOID COLOSTOMY  07-17-2006   PERFORATED  DIVERTICULITIS WITH PERITONITIS  . gamma knife  05/17/2016   for trigeminal neuralgia, [facility], Dr Salomon Fick  . gamma knife  2019  . PORTACATH PLACEMENT Left 11/28/2018   Procedure: INSERTION PORT-A-CATH WITH ULTRASOUND;  Surgeon: Excell Seltzer, MD;  Location: [facility];  Service: General;  Laterality: Left;  . RESECTION COLOSTOMY/ CLOSURE COLOSTOMY WITH COLOPROCTOSTOMY  02-20-2007  . TONSILLECTOMY  AS CHILD  . URETEROSCOPY  07/04/2012   Procedure: URETEROSCOPY;  Surgeon: Claybon Jabs, MD;  [facility]  Service: Urology;  Laterality: Left;  Marland Kitchen VAGINAL HYSTERECTOMY  1998   Partial  . WISDOM TOOTH EXTRACTION      There were no vitals filed for this visit.  Subjective Assessment - 08/31/19 0807    Subjective  Pt states she missed her appointment to get her compression bras because she got the days mixed up.  She has been trying to do the MLD but had some shocking, stabbing pain and went to see Dr. Burr Medico who said she thought it was from surgery . 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 It measures 1 cm and is located in the lower outer quadrant. It is triple negative with a Ki67 of 20%. She also has trigeminal neuralgia.  Pt returns ton 08/03/2019 after lumpectomy ( March 20 with 2 sentinel nodes by Dr. Excell Seltzer) and radiation( Dr. Sondra Come)  and continues with chemotherapy ( Dr. Burr Medico) until next April. ( She gets a treatment every 3 weeks) She has started to experience  neuropathy in her hands and feet and is being treated by her nueurologist Dr. Krista Blue for that. She is sent her by Dr. Sondra Come for breast lymphedmea         [facility] PT Assessment - 08/31/19 0001      AROM   Right Shoulder Flexion  147 Degrees                   [facility] Adult PT Treatment/Exercise - 08/31/19 0001      Exercises   Exercises  Shoulder      Neck Exercises: Seated   Other Seated Exercise  neck and thoracici ROM 5 reps in each direction       Shoulder Exercises: Supine   Protraction  AROM;Strengthening;Both    Horizontal ABduction  Strengthening;Both;5 reps;Theraband    Theraband Level (Shoulder Horizontal ABduction)  Level 1 (Yellow)    External Rotation  Strengthening;Both;5 reps    Theraband Level (Shoulder External Rotation)  Level 1 (Yellow)    Flexion  Strengthening;Both;5 reps;Theraband   narrow and wide grip    Theraband Level  (Shoulder Flexion)  Level 1 (Yellow)    Diagonals  Strengthening;Both;5 reps;Theraband    Theraband Level (Shoulder Diagonals)  Level 1 (Yellow)      Shoulder Exercises: Sidelying   ABduction  AROM;Right;5 reps      Manual Therapy   Manual Lymphatic Drainage (MLD)  In Supine: Short neck,5 diaphragmatic breaths Rt inguinal and Lt axilla nodes, Rt axillo-inguinal and, then focused on Rt breast, then into Lt S/L for further work to lateral breast and posterior inter-axillary and Lt axillo-inguinal anastomosis, then finished retracing steps     Passive ROM  PROM to right shoulder in all directions              PT Education - 08/31/19 1245    Education  Details  supine scapular series    Person(s) Educated  Patient    Methods  Explanation;Demonstration;Handout    Comprehension  Verbalized understanding          PT Long Term Goals - 08/03/19 1042      PT LONG TERM GOAL #1   Title  Patient will demonstrate she has regained full shoulder ROM and function post operaitvely compared to baselines.    Time  4    Period  Weeks    Status  New      PT LONG TERM GOAL #2   Title  Pt will report she can manage right breast lymphedema with self MLD and use of compression    Time  4    Period  Weeks    Status  New      PT LONG TERM GOAL #3   Title  Pt will be independent in a home exercise program for ROM and strength    Time  4    Period  Weeks    Status  New      PT LONG TERM GOAL #4   Title  Pt will reduce Quick DASH to < 35 indicating and improvment in function of arms    Baseline  63.64 on 08/03/2019    Time  4    Period  Weeks    Status  New      PT LONG TERM GOAL #5   Title  Pt will increase # of reps of sit to stand in 30 seconds to 10  indicating an improvement in general strength    Time  4    Period  Weeks    Status  New            Plan - 08/31/19 1246    Clinical Impression Statement  Pt had some pain over the weekend . she has not gotten her compression  bras yets. Focused on MLD today and upgraded strengthening exercise in supine with yellow theraband    Comorbidities  previous surgery and radiation, ongoing chemo with CIPN symptoms    Stability/Clinical Decision Making  Evolving/Moderate complexity    Rehab Potential  Excellent    PT Frequency  2x / week    PT Duration  4 weeks    PT Treatment/Interventions  ADLs/Self Care Home Management;Patient/family education;Therapeutic exercise;Manual techniques;Passive range of motion;Manual lymph drainage    PT Next Visit Plan  Assess compression bra when pt gets it and add foam to pec area if needed.  review supine scap series and add isometrics and low rows to shoulder for strength. when shoulder is less irritable, Assess STM tothe piriformis and lower legs and new HEP , begin heel raises and pelvic stabiliation exercises, need to move on to balance and strength for neuropathy soon? see how pt feels about status    Consulted and Agree with Plan of Care  Patient       Patient will benefit from skilled therapeutic intervention in order to improve the following deficits and impairments:  Decreased range of motion, Impaired UE functional use, Pain, Decreased knowledge of precautions, Postural dysfunction, Increased fascial restricitons, Increased muscle spasms, Decreased endurance, Impaired perceived functional ability, Decreased strength, Increased edema, Decreased mobility, Impaired sensation  Visit Diagnosis: Abnormal posture  Stiffness of right shoulder, not elsewhere classified  Muscle weakness (generalized)  Disorder of the skin and subcutaneous tissue related to radiation, unspecified     Problem List Patient Active Problem List   Diagnosis Date Noted  .  Muscle cramps 05/12/2019  . Cramps, muscle, general 04/23/2019  . Tinnitus 04/16/2019  . Chest pain of uncertain etiology 35/00/9381  . Bilateral leg edema 02/10/2019  . Port-A-Cath in place 01/30/2019  . Anemia, iron deficiency  11/21/2018  . Malignant neoplasm of upper-outer quadrant of right breast in female, estrogen receptor positive ([place]) 11/17/2018  . Itching 10/13/2018  . Left eye pain 07/01/2018  . Constipation 07/01/2018  . Weight loss 07/22/2017  . Memory loss 12/21/2016  . Tremor 12/21/2016  . Right sided temporal headache 12/21/2016  . Trochanteric bursitis, right hip 10/26/2016  . Pruritus 10/26/2016  . Foot sprain, left, initial encounter 08/31/2016  . Pain of right side of body 08/31/2016  . Headache 04/24/2016  . LLQ pain 04/24/2016  . Glossitis 02/02/2016  . Routine general medical examination at a health care facility 10/27/2015  . Facial pain 10/06/2015  . Trigeminal neuralgia of right side of face 11/30/2010  . Morbid obesity ([place]) 11/09/2010  . Fibromyalgia 11/09/2010  . HEMATURIA, HX OF 11/14/2009  . LOC OSTEOARTHROS NOT SPEC PRIM/SEC OTH [facility] SITE 06/27/2009  . Allergic rhinitis 08/26/2008  . 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 Owens Shark PT  Norwood Levo 08/31/2019, 12:49 PM  [place], [place], [address] Phone: [phone]   Fax:  [phone]  Name: Summer [name] MRN: [id number] Date of Birth: 1951-03-10

## 2019-08-31 NOTE — Telephone Encounter (Signed)
Scheduled appt per 12/18 los.  Left a vm of the appt date and time 

## 2019-09-01 ENCOUNTER — Encounter: Payer: Self-pay | Admitting: *Deleted

## 2019-09-02 ENCOUNTER — Ambulatory Visit: Payer: Federal, State, Local not specified - PPO | Admitting: Physical Therapy

## 2019-09-02 ENCOUNTER — Encounter: Payer: Self-pay | Admitting: Physical Therapy

## 2019-09-02 ENCOUNTER — Other Ambulatory Visit: Payer: Self-pay

## 2019-09-02 DIAGNOSIS — R262 Difficulty in walking, not elsewhere classified: Secondary | ICD-10-CM

## 2019-09-02 DIAGNOSIS — L599 Disorder of the skin and subcutaneous tissue related to radiation, unspecified: Secondary | ICD-10-CM | POA: Diagnosis not present

## 2019-09-02 DIAGNOSIS — M6281 Muscle weakness (generalized): Secondary | ICD-10-CM

## 2019-09-02 DIAGNOSIS — M25611 Stiffness of right shoulder, not elsewhere classified: Secondary | ICD-10-CM

## 2019-09-02 DIAGNOSIS — I890 Lymphedema, not elsewhere classified: Secondary | ICD-10-CM

## 2019-09-02 DIAGNOSIS — R293 Abnormal posture: Secondary | ICD-10-CM

## 2019-09-02 NOTE — Therapy (Signed)
[place], [place], [address] Phone: [phone]   Fax:  [phone]  Physical Therapy Treatment  Patient Details  Name: Summer [name] MRN: [id number] Date of Birth: July 19, 1951 Referring Provider (PT): Dr. Sondra Come    Encounter Date: 09/02/2019  PT End of Session - 09/02/19 0846    Visit Number  8    Number of Visits  9    Date for PT Re-Evaluation  09/02/19    PT Start Time  0800    PT Stop Time  0843    PT Time Calculation (min)  43 min    Activity Tolerance  Patient tolerated treatment well    Behavior During Therapy  [facility] for tasks assessed/performed       Past Medical History:  Diagnosis Date  . Abdominal pain, left lower quadrant 11/09/2009   Qualifier: Diagnosis of  By: Valma Cava LPN, Izora Gala    . Allergic rhinitis 08/26/2008   Qualifier: Diagnosis of  By: Sherren Mocha MD, Jory Ee   . Anemia, iron deficiency 11/21/2018  . Arthritis shoulders and back  . BACK PAIN, CHRONIC 06/13/2007   Qualifier: Diagnosis of  By: Sherren Mocha MD, Jory Ee   . 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 Constipation 07/01/2018  . Costochondritis 05/04/2014  . Diverticulitis of colon 06/13/2007   S/p segmental colectomy for perforated diverticulitis   . Diverticulosis   . Facial pain 10/06/2015  . Fibromyalgia   . Foot sprain, left, initial encounter 08/31/2016  . Glossitis 02/02/2016  . Headache 04/24/2016  . HEMATURIA UNSPECIFIED 06/25/2008   Qualifier: Diagnosis of  By: Sherren Mocha MD, Jory Ee   . HEMATURIA, HX OF 11/14/2009   Qualifier: Diagnosis of  By: Regino Schultze CMA (AAMA), Apolonio Schneiders    . History of kidney stones   . History of kidney stones   . History of shingles 2012--  no residual pain  . Itching 10/13/2018  . Left eye pain 07/01/2018  . Left ureteral calculus   . LLQ pain 04/24/2016  . LOC OSTEOARTHROS NOT SPEC PRIM/SEC OTH [facility] SITE 06/27/2009   Qualifier: Diagnosis of   By: Sherren Mocha MD, Jory Ee   . Malignant neoplasm of upper-outer quadrant of right breast in female, estrogen receptor positive ([name]) 11/17/2018  . MCI (mild cognitive impairment)   . Memory loss 12/21/2016  . Morbid obesity ([place]) 11/09/2010  . Pain of right side of body 08/31/2016  . Pruritus 10/26/2016  . Right sided temporal headache 12/21/2016  . Tremor 12/21/2016  . Trigeminal neuralgia RIGHT  . Trigeminal neuralgia of right side of face 11/30/2010  . Trochanteric bursitis, right hip 10/26/2016  . Urgency of urination   . Weight loss 07/22/2017  . Zoster without complications 8/92/1194    Past Surgical History:  Procedure Laterality Date  . BREAST LUMPECTOMY WITH RADIOACTIVE SEED AND SENTINEL LYMPH NODE BIOPSY Right 11/28/2018   Procedure: RIGHT BREAST LUMPECTOMY WITH RADIOACTIVE SEED AND RIGHT AXILLARY SENTINEL LYMPH NODE BIOPSY;  Surgeon: Excell Seltzer, MD;  Location: [place] [place] [facility];  Service: General;  Laterality: Right;  . CEREBRAL MICROVASCULAR DECOMPRESSION  07-05-2006   RIGHT TRIGEMINAL NERVE  . CYSTOSCOPY/RETROGRADE/URETEROSCOPY/STONE EXTRACTION WITH BASKET  07/04/2012   Procedure: CYSTOSCOPY/RETROGRADE/URETEROSCOPY/STONE EXTRACTION WITH BASKET;  Surgeon: Claybon Jabs, MD;  Location: [facility];  Service: Urology;  Laterality: Left;  . EXPLORATORY LAPAROTOMY/ RESECTION MID TO DISTAL SIGMOID AND PROXIMAL RECTUM/ END PROXIMAL SIGMOID COLOSTOMY  07-17-2006   PERFORATED  DIVERTICULITIS WITH PERITONITIS  . gamma knife  05/17/2016   for trigeminal neuralgia, [facility], Dr Salomon Fick  . gamma knife  2019  . PORTACATH PLACEMENT Left 11/28/2018   Procedure: INSERTION PORT-A-CATH WITH ULTRASOUND;  Surgeon: Excell Seltzer, MD;  Location: [facility];  Service: General;  Laterality: Left;  . RESECTION COLOSTOMY/ CLOSURE COLOSTOMY WITH COLOPROCTOSTOMY  02-20-2007  . TONSILLECTOMY  AS CHILD  . URETEROSCOPY  07/04/2012   Procedure: URETEROSCOPY;  Surgeon: Claybon Jabs, MD;  Location:  [facility];  Service: Urology;  Laterality: Left;  Marland Kitchen VAGINAL HYSTERECTOMY  1998   Partial  . WISDOM TOOTH EXTRACTION      There were no vitals filed for this visit.  Subjective Assessment - 09/02/19 0802    Subjective  Pt says she is doing well today. 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 It measures 1 cm and is located in the lower outer quadrant. It is triple negative with a Ki67 of 20%. She also has trigeminal neuralgia.  Pt returns ton 08/03/2019 after lumpectomy ( March 20 with 2 sentinel nodes by Dr. Excell Seltzer) and radiation( Dr. Sondra Come)  and continues with chemotherapy ( Dr. Burr Medico) until next April. ( She gets a treatment every 3 weeks) She has started to experience  neuropathy in her hands and feet and is being treated by her nueurologist Dr. Krista Blue for that. She is sent her by Dr. Sondra Come for breast lymphedmea    Currently in Pain?  No/denies         [facility] PT Assessment - 09/02/19 0001      Observation/Other Assessments   Quick DASH   4.55      Functional Tests   Functional tests  Sit to Stand      Sit to Stand   Comments  11 repetitions in 30 sec.      AROM   Right Shoulder Flexion  165 Degrees   with stretch    Right Shoulder ABduction  175 Degrees           Quick Dash - 09/02/19 0001    Open a tight or new jar  Mild difficulty    Do heavy household chores (wash walls, wash floors)  No difficulty    Carry a shopping bag or briefcase  No difficulty    Wash your back  No difficulty    Use a knife to cut food  No difficulty    Recreational activities in which you take some force or impact through your arm, shoulder, or hand (golf, hammering, tennis)  No difficulty    During the past week, to what extent has your arm, shoulder or hand problem interfered with your normal social activities with family, friends, neighbors, or groups?  Not at  all    During the past week, to what extent has your arm, shoulder or hand problem limited your work or other regular daily activities  Not at all    Arm, shoulder, or hand pain.  Mild    Tingling (pins and needles) in your arm, shoulder, or hand  None    Difficulty Sleeping  No difficulty    DASH Score  4.55 %             [facility] Adult PT Treatment/Exercise - 09/02/19 0001      Neck Exercises: Seated   Other Seated Exercise  neck and thoracici  ROM 5 reps in each direction       Knee/Hip Exercises: Standing   Heel Raises  Both;20 reps    Forward Lunges  Right;Left;10 reps    Lateral Step Up  Right;Left;10 reps    Forward Step Up  Right;Left;10 reps      Shoulder Exercises: Standing   External Rotation  Strengthening;Right;Left;Theraband   7 reps   Theraband Level (Shoulder External Rotation)  Level 2 (Red)    Internal Rotation  Strengthening;Right;Left;Theraband   7   Theraband Level (Shoulder Internal Rotation)  Level 2 (Red)    Flexion  Strengthening;Right;Left;Theraband   7   Theraband Level (Shoulder Flexion)  Level 2 (Red)    Extension  Strengthening;Right;Left;Theraband   7   Theraband Level (Shoulder Extension)  Level 2 (Red)    Row  Strengthening;Right;Left;Both;20 reps   10 bilaterally and 10 alternating    Theraband Level (Shoulder Row)  Level 2 (Red)      Shoulder Exercises: Pulleys   Flexion  2 minutes      Shoulder Exercises: Therapy Ball   Flexion  Both;10 reps    Other Therapy Ball Exercises  forearm on ball for pt to do diagaonal stretches                   PT Long Term Goals - 09/02/19 7341      PT LONG TERM GOAL #1   Title  Patient will demonstrate she has regained full shoulder ROM and function post operaitvely compared to baselines.    Status  Achieved      PT LONG TERM GOAL #2   Title  Pt will report she can manage right breast lymphedema with self MLD and use of compression    Baseline  pt to get compression bra, but feels the  lymphedema is better    Status  Achieved      PT LONG TERM GOAL #3   Title  Pt will be independent in a home exercise program for ROM and strength    Status  Achieved      PT LONG TERM GOAL #4   Title  Pt will reduce Quick DASH to < 35 indicating and improvment in function of arms    Baseline  63.64 on 08/03/2019, 4.55 on 09/02/2019    Status  Achieved      PT LONG TERM GOAL #5   Title  Pt will increase # of reps of sit to stand in 30 seconds to 10  indicating an improvement in general strength    Baseline  8 at [place], 11 on 09/02/2019    Status  Achieved            Plan - 09/02/19 0847    Clinical Impression Statement  Pt has made much improvement in breast lymphedema and is awaiting getting her compression bra that will help too.  She has been instructed in home exercise and is making gains in ROM and stretngth.  Her goals have been met and she is ready to discharge from PT    Personal Factors and Comorbidities  Comorbidity 3+    Comorbidities  previous surgery and radiation, ongoing chemo with CIPN symptoms    PT Treatment/Interventions  ADLs/Self Care Home Management;Patient/family education;Therapeutic exercise;Manual techniques;Passive range of motion;Manual lymph drainage    PT Next Visit Plan  discharge this episode    PT Home Exercise Plan  Post op shoulder ROM HEP, script request sent to Bary Castilla to send to [facility], Access Code: [email or web address], [place] shoulder strength program       Patient will benefit from skilled therapeutic intervention in order to improve the following deficits and impairments:  Decreased range of motion, Impaired UE functional use, Pain, Decreased knowledge of precautions, Postural dysfunction, Increased fascial restricitons, Increased muscle spasms, Decreased endurance, Impaired perceived functional ability, Decreased strength, Increased edema, Decreased mobility, Impaired sensation  Visit Diagnosis: Abnormal posture  Stiffness of  right shoulder, not elsewhere classified  Muscle weakness (generalized)  Disorder of the skin and subcutaneous tissue related to radiation, unspecified  Difficulty in walking, not elsewhere classified  Lymphedema, not elsewhere classified     Problem List Patient Active Problem List   Diagnosis Date Noted  . Muscle cramps 05/12/2019  . Cramps, muscle, general 04/23/2019  . Tinnitus 04/16/2019  . Chest pain of uncertain etiology 64/68/0321  . Bilateral leg edema 02/10/2019  . Port-A-Cath in place 01/30/2019  . Anemia, iron deficiency 11/21/2018  . Malignant neoplasm of upper-outer quadrant of right breast in female, estrogen receptor positive ([place]) 11/17/2018  . Itching 10/13/2018  . Left eye pain 07/01/2018  . Constipation 07/01/2018  . Weight loss 07/22/2017  . Memory loss 12/21/2016  . Tremor 12/21/2016  . Right sided temporal headache 12/21/2016  . Trochanteric bursitis, right hip 10/26/2016  . Pruritus 10/26/2016  . Foot sprain, left, initial encounter 08/31/2016  . Pain of right side of body 08/31/2016  . Headache 04/24/2016  . LLQ pain 04/24/2016  . Glossitis 02/02/2016  . Routine general medical examination at a health care facility 10/27/2015  . Facial pain 10/06/2015  . Trigeminal neuralgia of right side of face 11/30/2010  . Morbid obesity ([place]) 11/09/2010  . Fibromyalgia 11/09/2010  . HEMATURIA, HX OF 11/14/2009  . LOC OSTEOARTHROS NOT SPEC PRIM/SEC OTH [facility] SITE 06/27/2009  . Allergic rhinitis 08/26/2008  . 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  Patient goals were met. Patient is being discharged due to being  pleased with the current functional level.  ?????    Donato Heinz. Owens Shark PT  [place] Levo 09/02/2019, 8:50 AM  [place], [place], [address] Phone: [phone]   Fax:  [phone]  Name: Kalany Diekmann MRN: [id number] Date of Birth: July 12, 1951

## 2019-09-02 NOTE — Patient Instructions (Signed)

## 2019-09-10 ENCOUNTER — Encounter: Payer: Self-pay | Admitting: *Deleted

## 2019-09-18 ENCOUNTER — Inpatient Hospital Stay: Payer: Federal, State, Local not specified - PPO | Attending: Hematology

## 2019-09-18 ENCOUNTER — Other Ambulatory Visit: Payer: Self-pay

## 2019-09-18 VITALS — BP 132/73 | HR 76 | Temp 98.7°F | Resp 18

## 2019-09-18 DIAGNOSIS — C50411 Malignant neoplasm of upper-outer quadrant of right female breast: Secondary | ICD-10-CM | POA: Diagnosis not present

## 2019-09-18 DIAGNOSIS — Z170 Estrogen receptor positive status [ER+]: Secondary | ICD-10-CM | POA: Insufficient documentation

## 2019-09-18 DIAGNOSIS — Z5112 Encounter for antineoplastic immunotherapy: Secondary | ICD-10-CM | POA: Insufficient documentation

## 2019-09-18 MED ORDER — TRASTUZUMAB-DKST CHEMO 150 MG IV SOLR
6.0000 mg/kg | Freq: Once | INTRAVENOUS | Status: AC
  Administered 2019-09-18: 12:00:00 504 mg via INTRAVENOUS
  Filled 2019-09-18: qty 24

## 2019-09-18 MED ORDER — ACETAMINOPHEN 325 MG PO TABS
ORAL_TABLET | ORAL | Status: AC
  Filled 2019-09-18: qty 2

## 2019-09-18 MED ORDER — HEPARIN SOD (PORK) LOCK FLUSH 100 UNIT/ML IV SOLN
500.0000 [IU] | Freq: Once | INTRAVENOUS | Status: AC | PRN
  Administered 2019-09-18: 12:00:00 500 [IU]
  Filled 2019-09-18: qty 5

## 2019-09-18 MED ORDER — DIPHENHYDRAMINE HCL 25 MG PO CAPS
ORAL_CAPSULE | ORAL | Status: AC
  Filled 2019-09-18: qty 2

## 2019-09-18 MED ORDER — SODIUM CHLORIDE 0.9 % IV SOLN
Freq: Once | INTRAVENOUS | Status: AC
  Filled 2019-09-18: qty 250

## 2019-09-18 MED ORDER — SODIUM CHLORIDE 0.9% FLUSH
10.0000 mL | INTRAVENOUS | Status: DC | PRN
  Administered 2019-09-18: 12:00:00 10 mL
  Filled 2019-09-18: qty 10

## 2019-09-18 MED ORDER — DIPHENHYDRAMINE HCL 25 MG PO CAPS
50.0000 mg | ORAL_CAPSULE | Freq: Once | ORAL | Status: AC
  Administered 2019-09-18: 50 mg via ORAL

## 2019-09-18 MED ORDER — ACETAMINOPHEN 325 MG PO TABS
650.0000 mg | ORAL_TABLET | Freq: Once | ORAL | Status: AC
  Administered 2019-09-18: 11:00:00 650 mg via ORAL

## 2019-09-18 NOTE — Patient Instructions (Signed)
[facility] Discharge Instructions for Patients Receiving Chemotherapy  Today you received the following chemotherapy agent: Ogivri   To help prevent nausea and vomiting after your treatment, we encourage you to take your nausea medication as directed.    If you develop nausea and vomiting that is not controlled by your nausea medication, call the clinic.   BELOW ARE SYMPTOMS THAT SHOULD BE REPORTED IMMEDIATELY:  *FEVER GREATER THAN 100.5 F  *CHILLS WITH OR WITHOUT FEVER  NAUSEA AND VOMITING THAT IS NOT CONTROLLED WITH YOUR NAUSEA MEDICATION  *UNUSUAL SHORTNESS OF BREATH  *UNUSUAL BRUISING OR BLEEDING  TENDERNESS IN MOUTH AND THROAT WITH OR WITHOUT PRESENCE OF ULCERS  *URINARY PROBLEMS  *BOWEL PROBLEMS  UNUSUAL RASH Items with * indicate a potential emergency and should be followed up as soon as possible.  Feel free to call the clinic should you have any questions or concerns. The clinic phone number is [phone].  Please show the CHEMO ALERT CARD at check-in to the Emergency Department and triage nurse.   

## 2019-09-20 ENCOUNTER — Other Ambulatory Visit: Payer: Self-pay | Admitting: Hematology

## 2019-10-01 NOTE — Progress Notes (Signed)
Office Visit Note  Patient: Summer [name]             Date of Birth: June 15, 1951           MRN: [id number]             PCP: Hoyt Koch, MD Referring: Hoyt Koch, * Visit Date: 10/08/2019 Occupation: @GUAROCC @  Subjective:  Left knee joint pain   History of Present Illness: Summer [name] is a 69 y.o. female with history of osteoarthritis, DDD, and fibromyalgia.  Patient presents today with pain in bilateral knee joints.  Summer [name] states the pain in left knee has been severe.  Summer [name] experiences intermittent joint swelling in the left knee joint.  Summer [name] states that Summer [name] has difficulty climbing steps and getting up from a chair to the discomfort.  Summer [name] is also started to experience discomfort in the left trochanteric bursa due to gait changes.  Summer [name] would like a left knee joint and left trochanter bursa cortisone injection today.  Summer [name] would also like to apply for Visco gel injections for both knee joints.  Summer [name] requested to update her handicap placard.  Summer [name] denies any increased neck pain or lower back pain at this time.  Summer [name] continues have generalized muscle aches muscle tenderness due to fibromyalgia.  Overall her fatigue has been stable and Summer [name] is been sleeping well at night.    Activities of Daily Living:  Patient reports joint stiffness all day  Patient Reports nocturnal pain.  Difficulty dressing/grooming: Denies Difficulty climbing stairs: Reports Difficulty getting out of chair: Reports Difficulty using hands for taps, buttons, cutlery, and/or writing: Reports  Review of Systems  Constitutional: Negative for fatigue.  HENT: Negative for mouth sores, mouth dryness and nose dryness.   Eyes: Negative for pain, visual disturbance and dryness.  Respiratory: Negative for cough, hemoptysis, shortness of breath and difficulty breathing.   Cardiovascular: Positive for swelling in legs/feet. Negative for chest pain, palpitations and hypertension.  Gastrointestinal:  Negative for blood in stool, constipation and diarrhea.  Endocrine: Negative for heat intolerance and increased urination.  Genitourinary: Negative for painful urination.  Musculoskeletal: Positive for arthralgias, joint pain, joint swelling, muscle weakness, morning stiffness and muscle tenderness. Negative for myalgias and myalgias.  Skin: Negative for color change, pallor, rash, hair loss, nodules/bumps, skin tightness, ulcers and sensitivity to sunlight.  Allergic/Immunologic: Negative for susceptible to infections.  Neurological: Negative for dizziness, numbness, headaches and weakness.  Hematological: Negative for bruising/bleeding tendency and swollen glands.  Psychiatric/Behavioral: Negative for depressed mood and sleep disturbance. The patient is not nervous/anxious.     PMFS History:  Patient Active Problem List   Diagnosis Date Noted  . Muscle cramps 05/12/2019  . Cramps, muscle, general 04/23/2019  . Tinnitus 04/16/2019  . Chest pain of uncertain etiology [id number]  . Bilateral leg edema 02/10/2019  . Port-A-Cath in place 01/30/2019  . Anemia, iron deficiency 11/21/2018  . Malignant neoplasm of upper-outer quadrant of right breast in female, estrogen receptor positive ([place]) 11/17/2018  . Itching 10/13/2018  . Left eye pain 07/01/2018  . Constipation 07/01/2018  . Weight loss 07/22/2017  . Memory loss 12/21/2016  . Tremor 12/21/2016  . Right sided temporal headache 12/21/2016  . Trochanteric bursitis, right hip 10/26/2016  . Pruritus 10/26/2016  . Foot sprain, left, initial encounter 08/31/2016  . Pain of right side of body 08/31/2016  . Headache 04/24/2016  . LLQ pain 04/24/2016  . Glossitis 02/02/2016  . Routine general medical examination at a  health care facility 10/27/2015  . Facial pain 10/06/2015  . Trigeminal neuralgia of right side of face 11/30/2010  . Morbid obesity ([place]) 11/09/2010  . Fibromyalgia 11/09/2010  . HEMATURIA, HX OF 11/14/2009  . LOC  OSTEOARTHROS NOT SPEC PRIM/SEC OTH [facility] SITE 06/27/2009  . Allergic rhinitis 08/26/2008  . 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 Abdominal pain, left lower quadrant 11/09/2009   Qualifier: Diagnosis of  By: Valma Cava LPN, Izora Gala    . Allergic rhinitis 08/26/2008   Qualifier: Diagnosis of  By: Sherren Mocha MD, Jory Ee   . Anemia, iron deficiency 11/21/2018  . Arthritis shoulders and back  . BACK PAIN, CHRONIC 06/13/2007   Qualifier: Diagnosis of  By: Sherren Mocha MD, Jory Ee   . 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 Constipation 07/01/2018  . Costochondritis 05/04/2014  . Diverticulitis of colon 06/13/2007   S/p segmental colectomy for perforated diverticulitis   . Diverticulosis   . Facial pain 10/06/2015  . Fibromyalgia   . Foot sprain, left, initial encounter 08/31/2016  . Glossitis 02/02/2016  . Headache 04/24/2016  . HEMATURIA UNSPECIFIED 06/25/2008   Qualifier: Diagnosis of  By: Sherren Mocha MD, Jory Ee   . HEMATURIA, HX OF 11/14/2009   Qualifier: Diagnosis of  By: Regino Schultze CMA (AAMA), Apolonio Schneiders    . History of kidney stones   . History of kidney stones   . History of shingles 2012--  no residual pain  . Itching 10/13/2018  . Left eye pain 07/01/2018  . Left ureteral calculus   . LLQ pain 04/24/2016  . LOC OSTEOARTHROS NOT SPEC PRIM/SEC OTH [facility] SITE 06/27/2009   Qualifier: Diagnosis of  By: Sherren Mocha MD, Jory Ee   . Malignant neoplasm of upper-outer quadrant of right breast in female, estrogen receptor positive ([place]) 11/17/2018  . MCI (mild cognitive impairment)   . Memory loss 12/21/2016  . Morbid obesity ([place]) 11/09/2010  . Pain of right side of body 08/31/2016  . Pruritus 10/26/2016  . Right sided temporal headache 12/21/2016  . Tremor 12/21/2016  . Trigeminal neuralgia RIGHT  . Trigeminal neuralgia of right side of face 11/30/2010  . Trochanteric bursitis, right hip 10/26/2016  . Urgency of urination     . Weight loss 07/22/2017  . Zoster without complications [id number]    Family History  Problem Relation Age of Onset  . Heart disease Maternal Grandmother   . Diabetes Mother   . Breast cancer Maternal Aunt   . Lupus Cousin   . Colon cancer Neg Hx   . Esophageal cancer Neg Hx   . Rectal cancer Neg Hx   . Stomach cancer Neg Hx    Past Surgical History:  Procedure Laterality Date  . BREAST LUMPECTOMY WITH RADIOACTIVE SEED AND SENTINEL LYMPH NODE BIOPSY Right 11/28/2018   Procedure: RIGHT BREAST LUMPECTOMY WITH RADIOACTIVE SEED AND RIGHT AXILLARY SENTINEL LYMPH NODE BIOPSY;  Surgeon: Excell Seltzer, MD;  Location: [place];  Service: General;  Laterality: Right;  . CEREBRAL MICROVASCULAR DECOMPRESSION  07-05-2006   RIGHT TRIGEMINAL NERVE  . CYSTOSCOPY/RETROGRADE/URETEROSCOPY/STONE EXTRACTION WITH BASKET  07/04/2012   Procedure: CYSTOSCOPY/RETROGRADE/URETEROSCOPY/STONE EXTRACTION WITH BASKET;  Surgeon: Claybon Jabs, MD;  Location: [facility];  Service: Urology;  Laterality: Left;  . EXPLORATORY LAPAROTOMY/ RESECTION MID TO DISTAL SIGMOID AND PROXIMAL RECTUM/ END PROXIMAL SIGMOID COLOSTOMY  07-17-2006   PERFORATED DIVERTICULITIS WITH PERITONITIS  . gamma knife  05/17/2016   for trigeminal neuralgia, [facility], Dr Salomon Fick  . gamma knife  2019  . PORTACATH PLACEMENT Left 11/28/2018   Procedure: INSERTION PORT-A-CATH WITH ULTRASOUND;  Surgeon: Excell Seltzer, MD;  Location: [place];  Service: General;  Laterality: Left;  . RESECTION COLOSTOMY/ CLOSURE COLOSTOMY WITH COLOPROCTOSTOMY  02-20-2007  . TONSILLECTOMY  AS CHILD  . URETEROSCOPY  07/04/2012   Procedure: URETEROSCOPY;  Surgeon: Claybon Jabs, MD;  Location: [facility];  Service: Urology;  Laterality: Left;  Marland Kitchen VAGINAL HYSTERECTOMY  1998   Partial  . WISDOM TOOTH EXTRACTION     Social History   Social History Narrative  . Not on file   Immunization History  Administered Date(s) Administered   . Td 09/10/1998, 06/27/2009  . Tdap 09/07/2018  . Zoster 10/27/2015     Objective: Vital Signs: BP (!) 114/57 (BP Location: Left Arm, Patient Position: Sitting, Cuff Size: Normal)   Pulse 73   Resp 16   Ht 5\' 6"  (1.676 m)   Wt 190 lb (86.2 kg)   BMI 30.67 kg/m    Physical Exam Vitals and nursing note reviewed.  Constitutional:      Appearance: Summer [name] is well-developed.  HENT:     Head: Normocephalic and atraumatic.  Eyes:     Conjunctiva/sclera: Conjunctivae normal.  Cardiovascular:     Rate and Rhythm: Normal rate and regular rhythm.     Heart sounds: Normal heart sounds.  Pulmonary:     Effort: Pulmonary effort is normal.     Breath sounds: Normal breath sounds.  Abdominal:     General: Bowel sounds are normal.     Palpations: Abdomen is soft.  Musculoskeletal:     Cervical back: Normal range of motion.  Lymphadenopathy:     Cervical: No cervical adenopathy.  Skin:    General: Skin is warm and dry.     Capillary Refill: Capillary refill takes less than 2 seconds.  Neurological:     Mental Status: Summer [name] is alert and oriented to person, place, and time.  Psychiatric:        Behavior: Behavior normal.      Musculoskeletal Exam: C-spine limited range of motion with lateral rotation.  Mild thoracic kyphosis noted.  Good range of motion lumbar spine.  Shoulder joint abduction to about 90 degrees bilaterally.  Elbow joints, wrist joints, MCPs, PIPs and DIPs good range of motion no synovitis.  Summer [name] has complete fist formation bilaterally.  Hip joints have good range of motion with no discomfort.  Summer [name] has tenderness over the left trochanteric bursa.  Knee joints have good range of motion with discomfort bilaterally.  Summer [name] has mild warmth of the left knee but no effusion was noted.  Ankle joints have good range of motion with no tenderness or inflammation.  CDAI Exam: CDAI Score: -- Patient Global: --; Provider Global: -- Swollen: --; Tender: -- Joint Exam 10/08/2019   No  joint exam has been documented for this visit   There is currently no information documented on the homunculus. Go to the Rheumatology activity and complete the homunculus joint exam.  Investigation: No additional findings.  Imaging: No results found.  Recent Labs: Lab Results  Component Value Date   WBC 4.8 08/28/2019   HGB 12.3 08/28/2019   PLT 206 08/28/2019   NA 141 08/28/2019   K 4.1 08/28/2019   CL 105 08/28/2019   CO2 29 08/28/2019   GLUCOSE 92 08/28/2019   BUN 12 08/28/2019   CREATININE  0.73 08/28/2019   BILITOT 0.8 08/28/2019   ALKPHOS 110 08/28/2019   AST 19 08/28/2019   ALT 12 08/28/2019   PROT 6.5 08/28/2019   ALBUMIN 3.3 (L) 08/28/2019   CALCIUM 8.6 (L) 08/28/2019   GFRAA >60 08/28/2019    Speciality Comments: No specialty comments available.  Procedures:  Large Joint Inj: L knee on 10/08/2019 2:06 PM Indications: pain Details: 27 G 1.5 in needle, medial approach  Arthrogram: No  Medications: 1.5 mL lidocaine 1 %; 40 mg triamcinolone acetonide 40 MG/ML Aspirate: 0 mL Outcome: tolerated well, no immediate complications Procedure, treatment alternatives, risks and benefits explained, specific risks discussed. Consent was given by the patient. Immediately prior to procedure a time out was called to verify the correct patient, procedure, equipment, support staff and site/side marked as required. Patient was prepped and draped in the usual sterile fashion.   Large Joint Inj: L greater trochanter on 10/08/2019 2:07 PM Indications: pain Details: 27 G 1.5 in needle, lateral approach  Arthrogram: No  Medications: 40 mg triamcinolone acetonide 40 MG/ML; 1.5 mL lidocaine 1 % Aspirate: 0 mL Outcome: tolerated well, no immediate complications Procedure, treatment alternatives, risks and benefits explained, specific risks discussed. Consent was given by the patient. Immediately prior to procedure a time out was called to verify the correct patient, procedure,  equipment, support staff and site/side marked as required. Patient was prepped and draped in the usual sterile fashion.     Allergies: Clindamycin hcl   Assessment / Plan:     Visit Diagnoses: Primary osteoarthritis of both knees: Summer [name] presents today with pain in bilateral knee joints.  The pain in her left knee has been severe recently.  Summer [name] has been having increased difficulty climbing steps and getting up from a chair due to the discomfort Summer [name] experiences.  Summer [name] feels that Summer [name] has had to compensate for her left knee joint pain which has been causing her right knee to be more uncomfortable.  Summer [name] has good range of motion of both knee joints with some discomfort on exam.  Summer [name] has mild warmth of the left knee but no effusion was noted.  Summer [name] requested a left knee joint cortisone injection. Summer [name] tolerated the procedure well. X-rays of both knee joints were obtained today.  Summer [name] would like to apply for Visco gel injections for both knee joints.  We discussed the importance of lower extremity muscle strengthening.  Summer [name] was given a handout on knee exercises to perform.  We updated her permanent handicap placard form today.  Summer [name] will follow-up in the office in 6 months.  Chronic pain of both knees -Summer [name] continues to have chronic pain in both knee joints.  X-rays of both knees were updated today.  We will apply for Visco gel injections for both knees.  The left knee joint was injected with cortisone today.  Summer [name] tolerated the procedure well.  The procedure note was completed above.  Plan: XR KNEE 3 VIEW RIGHT, XR KNEE 3 VIEW LEFT.  The x-ray showed moderate osteoarthritis and severe chondromalacia patella.  Trochanteric bursitis of left hip: Summer [name] presents today with discomfort due to left trochanteric bursitis.  Summer [name] has tenderness with palpation on exam.  Summer [name] requested a left trochanteric bursa cortisone injection.  Summer [name] tolerated the procedure well.  The procedure note was completed above.  Summer [name] was given a handout of  exercises to perform.  DDD (degenerative disc disease), cervical: Summer [name] has limited ROM with lateral rotation.  No symptoms of radiculopathy.   DDD (  degenerative disc disease), thoracic: Mild thoracic kyphosis.  No midline spinal tenderness.   DDD (degenerative disc disease), lumbar: Summer [name] has intermittent lower back pain.  Summer [name] is not having any increased discomfort at this time.  Summer [name] has no symptoms of radiculopathy.  Summer [name] has no midline spinal tenderness.   Fibromyalgia: Summer [name] continues have generalized muscle aches and muscle tenderness due to fibromyalgia.  History of insomnia: Summer [name] has been sleeping well at night.   Other medical conditions are listed as follows:   History of trigeminal neuralgia  History of diverticulitis   Orders: Orders Placed This Encounter  Procedures  . Large Joint Inj  . Large Joint Inj  . XR KNEE 3 VIEW RIGHT  . XR KNEE 3 VIEW LEFT   No orders of the defined types were placed in this encounter.   Face-to-face time spent with patient was 30 minutes. Greater than 50% of time was spent in counseling and coordination of care.  Follow-Up Instructions: Return in about 6 months (around 04/06/2020) for Osteoarthritis, Fibromyalgia, DDD.   Ofilia Neas, PA-C  I examined and evaluated the patient with Hazel Sams PA.  Summer [name] continues to have a lot of discomfort in her knee joints.  Summer [name] had no warmth swelling or effusion.  I reviewed x-rays today which showed moderate osteoarthritis and severe chondromalacia patella.  Per her request her left knee joint was injected with cortisone injection.  The cortisone gives her only short-term relief.  We will also apply for Visco supplement injections.  The plan of care was discussed as noted above.  Bo Merino, MD Note - This record has been created using Editor, commissioning.  Chart creation errors have been sought, but may not always  have been located. Such creation errors do not reflect on  the standard of medical  care.

## 2019-10-08 ENCOUNTER — Ambulatory Visit: Payer: Self-pay

## 2019-10-08 ENCOUNTER — Telehealth: Payer: Self-pay

## 2019-10-08 ENCOUNTER — Encounter: Payer: Self-pay | Admitting: Rheumatology

## 2019-10-08 ENCOUNTER — Ambulatory Visit: Payer: Federal, State, Local not specified - PPO | Admitting: Rheumatology

## 2019-10-08 ENCOUNTER — Other Ambulatory Visit: Payer: Self-pay

## 2019-10-08 VITALS — BP 114/57 | HR 73 | Resp 16 | Ht 66.0 in | Wt 190.0 lb

## 2019-10-08 DIAGNOSIS — M7062 Trochanteric bursitis, left hip: Secondary | ICD-10-CM | POA: Diagnosis not present

## 2019-10-08 DIAGNOSIS — M25561 Pain in right knee: Secondary | ICD-10-CM

## 2019-10-08 DIAGNOSIS — M170 Bilateral primary osteoarthritis of knee: Secondary | ICD-10-CM | POA: Diagnosis not present

## 2019-10-08 DIAGNOSIS — M5030 Other cervical disc degeneration, unspecified cervical region: Secondary | ICD-10-CM

## 2019-10-08 DIAGNOSIS — M5134 Other intervertebral disc degeneration, thoracic region: Secondary | ICD-10-CM

## 2019-10-08 DIAGNOSIS — M25562 Pain in left knee: Secondary | ICD-10-CM

## 2019-10-08 DIAGNOSIS — Z8669 Personal history of other diseases of the nervous system and sense organs: Secondary | ICD-10-CM

## 2019-10-08 DIAGNOSIS — Z8719 Personal history of other diseases of the digestive system: Secondary | ICD-10-CM

## 2019-10-08 DIAGNOSIS — Z87898 Personal history of other specified conditions: Secondary | ICD-10-CM

## 2019-10-08 DIAGNOSIS — G8929 Other chronic pain: Secondary | ICD-10-CM

## 2019-10-08 DIAGNOSIS — M5136 Other intervertebral disc degeneration, lumbar region: Secondary | ICD-10-CM

## 2019-10-08 DIAGNOSIS — M797 Fibromyalgia: Secondary | ICD-10-CM

## 2019-10-08 MED ORDER — TRIAMCINOLONE ACETONIDE 40 MG/ML IJ SUSP
40.0000 mg | INTRAMUSCULAR | Status: AC | PRN
  Administered 2019-10-08: 40 mg via INTRA_ARTICULAR

## 2019-10-08 MED ORDER — LIDOCAINE HCL 1 % IJ SOLN
1.5000 mL | INTRAMUSCULAR | Status: AC | PRN
  Administered 2019-10-08: 1.5 mL

## 2019-10-08 NOTE — Patient Instructions (Signed)
Hip Bursitis Rehab Ask your health care provider which exercises are safe for you. Do exercises exactly as told by your health care provider and adjust them as directed. It is normal to feel mild stretching, pulling, tightness, or discomfort as you do these exercises. Stop right away if you feel sudden pain or your pain gets worse. Do not begin these exercises until told by your health care provider. Stretching exercise This exercise warms up your muscles and joints and improves the movement and flexibility of your hip. This exercise also helps to relieve pain and stiffness. Iliotibial band stretch An iliotibial band is a strong band of muscle tissue that runs from the outer side of your hip to the outer side of your thigh and knee. 1. Lie on your side with your left / right leg in the top position. 2. Bend your left / right knee and grab your ankle. Stretch out your bottom arm to help you balance. 3. Slowly bring your knee back so your thigh is behind your body. 4. Slowly lower your knee toward the floor until you feel a gentle stretch on the outside of your left / right thigh. If you do not feel a stretch and your knee will not fall farther, place the heel of your other foot on top of your knee and pull your knee down toward the floor with your foot. 5. Hold this position for __________ seconds. 6. Slowly return to the starting position. Repeat __________ times. Complete this exercise __________ times a day. Strengthening exercises These exercises build strength and endurance in your hip and pelvis. Endurance is the ability to use your muscles for a long time, even after they get tired. Bridge This exercise strengthens the muscles that move your thigh backward (hip extensors). 1. Lie on your back on a firm surface with your knees bent and your feet flat on the floor. 2. Tighten your buttocks muscles and lift your buttocks off the floor until your trunk is level with your thighs. ? Do not arch  your back. ? You should feel the muscles working in your buttocks and the back of your thighs. If you do not feel these muscles, slide your feet 1-2 inches (2.5-5 cm) farther away from your buttocks. ? If this exercise is too easy, try doing it with your arms crossed over your chest. 3. Hold this position for __________ seconds. 4. Slowly lower your hips to the starting position. 5. Let your muscles relax completely after each repetition. Repeat __________ times. Complete this exercise __________ times a day. Squats This exercise strengthens the muscles in front of your thigh and knee (quadriceps). 1. Stand in front of a table, with your feet and knees pointing straight ahead. You may rest your hands on the table for balance but not for support. 2. Slowly bend your knees and lower your hips like you are going to sit in a chair. ? Keep your weight over your heels, not over your toes. ? Keep your lower legs upright so they are parallel with the table legs. ? Do not let your hips go lower than your knees. ? Do not bend lower than told by your health care provider. ? If your hip pain increases, do not bend as low. 3. Hold the squat position for __________ seconds. 4. Slowly push with your legs to return to standing. Do not use your hands to pull yourself to standing. Repeat __________ times. Complete this exercise __________ times a day. Hip hike 1. Stand   sideways on a bottom step. Stand on your left / right leg with your other foot unsupported next to the step. You can hold on to the railing or wall for balance if needed. 2. Keep your knees straight and your torso square. Then lift your left / right hip up toward the ceiling. 3. Hold this position for __________ seconds. 4. Slowly let your left / right hip lower toward the floor, past the starting position. Your foot should get closer to the floor. Do not lean or bend your knees. Repeat __________ times. Complete this exercise __________ times a  day. Single leg stand 1. Without shoes, stand near a railing or in a doorway. You may hold on to the railing or door frame as needed for balance. 2. Squeeze your left / right buttock muscles, then lift up your other foot. ? Do not let your left / right hip push out to the side. ? It is helpful to stand in front of a mirror for this exercise so you can watch your hip. 3. Hold this position for __________ seconds. Repeat __________ times. Complete this exercise __________ times a day. This information is not intended to replace advice given to you by your health care provider. Make sure you discuss any questions you have with your health care provider. Document Revised: 12/22/2018 Document Reviewed: 12/22/2018 Elsevier Patient Education  2020 [place] [employer]ers, 15(4), [id number]. Retrieved June 16, 2018 from http://clinicalkey.com/nursing">  Knee Exercises Ask your health care provider which exercises are safe for you. Do exercises exactly as told by your health care provider and adjust them as directed. It is normal to feel mild stretching, pulling, tightness, or discomfort as you do these exercises. Stop right away if you feel sudden pain or your pain gets worse. Do not begin these exercises until told by your health care provider. Stretching and range-of-motion exercises These exercises warm up your muscles and joints and improve the movement and flexibility of your knee. These exercises also help to relieve pain and swelling. Knee extension, prone 1. Lie on your abdomen (prone position) on a bed. 2. Place your left / right knee just beyond the edge of the surface so your knee is not on the bed. You can put a towel under your left / right thigh just above your kneecap for comfort. 3. Relax your leg muscles and allow gravity to straighten your knee (extension). You should feel a stretch behind your left / right knee. 4. Hold this position for __________  seconds. 5. Scoot up so your knee is supported between repetitions. Repeat __________ times. Complete this exercise __________ times a day. Knee flexion, active  1. Lie on your back with both legs straight. If this causes back discomfort, bend your left / right knee so your foot is flat on the floor. 2. Slowly slide your left / right heel back toward your buttocks. Stop when you feel a gentle stretch in the front of your knee or thigh (flexion). 3. Hold this position for __________ seconds. 4. Slowly slide your left / right heel back to the starting position. Repeat __________ times. Complete this exercise __________ times a day. Quadriceps stretch, prone  1. Lie on your abdomen on a firm surface, such as a bed or padded floor. 2. Bend your left / right knee and hold your ankle. If you cannot reach your ankle or pant leg, loop a belt around your foot and grab the belt instead. 3. Gently pull your heel  toward your buttocks. Your knee should not slide out to the side. You should feel a stretch in the front of your thigh and knee (quadriceps). 4. Hold this position for __________ seconds. Repeat __________ times. Complete this exercise __________ times a day. Hamstring, supine 1. Lie on your back (supine position). 2. Loop a belt or towel over the ball of your left / right foot. The ball of your foot is on the walking surface, right under your toes. 3. Straighten your left / right knee and slowly pull on the belt to raise your leg until you feel a gentle stretch behind your knee (hamstring). ? Do not let your knee bend while you do this. ? Keep your other leg flat on the floor. 4. Hold this position for __________ seconds. Repeat __________ times. Complete this exercise __________ times a day. Strengthening exercises These exercises build strength and endurance in your knee. Endurance is the ability to use your muscles for a long time, even after they get tired. Quadriceps, isometric This  exercise stretches the muscles in front of your thigh (quadriceps) without moving your knee joint (isometric). 1. Lie on your back with your left / right leg extended and your other knee bent. Put a rolled towel or small pillow under your knee if told by your health care provider. 2. Slowly tense the muscles in the front of your left / right thigh. You should see your kneecap slide up toward your hip or see increased dimpling just above the knee. This motion will push the back of the knee toward the floor. 3. For __________ seconds, hold the muscle as tight as you can without increasing your pain. 4. Relax the muscles slowly and completely. Repeat __________ times. Complete this exercise __________ times a day. Straight leg raises This exercise stretches the muscles in front of your thigh (quadriceps) and the muscles that move your hips (hip flexors). 1. Lie on your back with your left / right leg extended and your other knee bent. 2. Tense the muscles in the front of your left / right thigh. You should see your kneecap slide up or see increased dimpling just above the knee. Your thigh may even shake a bit. 3. Keep these muscles tight as you raise your leg 4-6 inches (10-15 cm) off the floor. Do not let your knee bend. 4. Hold this position for __________ seconds. 5. Keep these muscles tense as you lower your leg. 6. Relax your muscles slowly and completely after each repetition. Repeat __________ times. Complete this exercise __________ times a day. Hamstring, isometric 1. Lie on your back on a firm surface. 2. Bend your left / right knee about __________ degrees. 3. Dig your left / right heel into the surface as if you are trying to pull it toward your buttocks. Tighten the muscles in the back of your thighs (hamstring) to "dig" as hard as you can without increasing any pain. 4. Hold this position for __________ seconds. 5. Release the tension gradually and allow your muscles to relax  completely for __________ seconds after each repetition. Repeat __________ times. Complete this exercise __________ times a day. Hamstring curls If told by your health care provider, do this exercise while wearing ankle weights. Begin with __________ lb weights. Then increase the weight by 1 lb (0.5 kg) increments. Do not wear ankle weights that are more than __________ lb. 1. Lie on your abdomen with your legs straight. 2. Bend your left / right knee as far as you  can without feeling pain. Keep your hips flat against the floor. 3. Hold this position for __________ seconds. 4. Slowly lower your leg to the starting position. Repeat __________ times. Complete this exercise __________ times a day. Squats This exercise strengthens the muscles in front of your thigh and knee (quadriceps). 1. Stand in front of a table, with your feet and knees pointing straight ahead. You may rest your hands on the table for balance but not for support. 2. Slowly bend your knees and lower your hips like you are going to sit in a chair. ? Keep your weight over your heels, not over your toes. ? Keep your lower legs upright so they are parallel with the table legs. ? Do not let your hips go lower than your knees. ? Do not bend lower than told by your health care provider. ? If your knee pain increases, do not bend as low. 3. Hold the squat position for __________ seconds. 4. Slowly push with your legs to return to standing. Do not use your hands to pull yourself to standing. Repeat __________ times. Complete this exercise __________ times a day. Wall slides This exercise strengthens the muscles in front of your thigh and knee (quadriceps). 1. Lean your back against a smooth wall or door, and walk your feet out 18-24 inches (46-61 cm) from it. 2. Place your feet hip-width apart. 3. Slowly slide down the wall or door until your knees bend __________ degrees. Keep your knees over your heels, not over your toes. Keep  your knees in line with your hips. 4. Hold this position for __________ seconds. Repeat __________ times. Complete this exercise __________ times a day. Straight leg raises This exercise strengthens the muscles that rotate the leg at the hip and move it away from your body (hip abductors). 1. Lie on your side with your left / right leg in the top position. Lie so your head, shoulder, knee, and hip line up. You may bend your bottom knee to help you keep your balance. 2. Roll your hips slightly forward so your hips are stacked directly over each other and your left / right knee is facing forward. 3. Leading with your heel, lift your top leg 4-6 inches (10-15 cm). You should feel the muscles in your outer hip lifting. ? Do not let your foot drift forward. ? Do not let your knee roll toward the ceiling. 4. Hold this position for __________ seconds. 5. Slowly return your leg to the starting position. 6. Let your muscles relax completely after each repetition. Repeat __________ times. Complete this exercise __________ times a day. Straight leg raises This exercise stretches the muscles that move your hips away from the front of the pelvis (hip extensors). 1. Lie on your abdomen on a firm surface. You can put a pillow under your hips if that is more comfortable. 2. Tense the muscles in your buttocks and lift your left / right leg about 4-6 inches (10-15 cm). Keep your knee straight as you lift your leg. 3. Hold this position for __________ seconds. 4. Slowly lower your leg to the starting position. 5. Let your leg relax completely after each repetition. Repeat __________ times. Complete this exercise __________ times a day. This information is not intended to replace advice given to you by your health care provider. Make sure you discuss any questions you have with your health care provider. Document Revised: 06/17/2018 Document Reviewed: 06/17/2018 Elsevier Patient Education  2020 [employer].

## 2019-10-08 NOTE — Telephone Encounter (Signed)
Please apply for bilateral knee visco, per Taylor Dale, PA-C. Thanks!  

## 2019-10-09 ENCOUNTER — Inpatient Hospital Stay: Payer: Federal, State, Local not specified - PPO

## 2019-10-09 ENCOUNTER — Other Ambulatory Visit: Payer: Self-pay

## 2019-10-09 VITALS — BP 135/65 | HR 76 | Temp 99.3°F | Resp 18

## 2019-10-09 DIAGNOSIS — C50411 Malignant neoplasm of upper-outer quadrant of right female breast: Secondary | ICD-10-CM

## 2019-10-09 DIAGNOSIS — Z170 Estrogen receptor positive status [ER+]: Secondary | ICD-10-CM

## 2019-10-09 MED ORDER — SODIUM CHLORIDE 0.9 % IV SOLN
Freq: Once | INTRAVENOUS | Status: AC
  Filled 2019-10-09: qty 250

## 2019-10-09 MED ORDER — DIPHENHYDRAMINE HCL 25 MG PO CAPS
ORAL_CAPSULE | ORAL | Status: AC
  Filled 2019-10-09: qty 2

## 2019-10-09 MED ORDER — SODIUM CHLORIDE 0.9% FLUSH
10.0000 mL | INTRAVENOUS | Status: DC | PRN
  Administered 2019-10-09: 10 mL
  Filled 2019-10-09: qty 10

## 2019-10-09 MED ORDER — DIPHENHYDRAMINE HCL 25 MG PO CAPS
50.0000 mg | ORAL_CAPSULE | Freq: Once | ORAL | Status: AC
  Administered 2019-10-09: 50 mg via ORAL

## 2019-10-09 MED ORDER — ACETAMINOPHEN 325 MG PO TABS
650.0000 mg | ORAL_TABLET | Freq: Once | ORAL | Status: AC
  Administered 2019-10-09: 650 mg via ORAL

## 2019-10-09 MED ORDER — HEPARIN SOD (PORK) LOCK FLUSH 100 UNIT/ML IV SOLN
500.0000 [IU] | Freq: Once | INTRAVENOUS | Status: AC | PRN
  Administered 2019-10-09: 500 [IU]
  Filled 2019-10-09: qty 5

## 2019-10-09 MED ORDER — TRASTUZUMAB-DKST CHEMO 150 MG IV SOLR
6.0000 mg/kg | Freq: Once | INTRAVENOUS | Status: AC
  Administered 2019-10-09: 504 mg via INTRAVENOUS
  Filled 2019-10-09: qty 24

## 2019-10-09 MED ORDER — ACETAMINOPHEN 325 MG PO TABS
ORAL_TABLET | ORAL | Status: AC
  Filled 2019-10-09: qty 2

## 2019-10-09 NOTE — Progress Notes (Signed)
OK to treat based on Dec 2020 labs and no labs today per MD Burr Medico

## 2019-10-09 NOTE — Patient Instructions (Signed)
[facility] Discharge Instructions for Patients Receiving Chemotherapy  Today you received the following chemotherapy agents: Trastuzumab   To help prevent nausea and vomiting after your treatment, we encourage you to take your nausea medication  as prescribed.    If you develop nausea and vomiting that is not controlled by your nausea medication, call the clinic.   BELOW ARE SYMPTOMS THAT SHOULD BE REPORTED IMMEDIATELY:  *FEVER GREATER THAN 100.5 F  *CHILLS WITH OR WITHOUT FEVER  NAUSEA AND VOMITING THAT IS NOT CONTROLLED WITH YOUR NAUSEA MEDICATION  *UNUSUAL SHORTNESS OF BREATH  *UNUSUAL BRUISING OR BLEEDING  TENDERNESS IN MOUTH AND THROAT WITH OR WITHOUT PRESENCE OF ULCERS  *URINARY PROBLEMS  *BOWEL PROBLEMS  UNUSUAL RASH Items with * indicate a potential emergency and should be followed up as soon as possible.  Feel free to call the clinic should you have any questions or concerns. The clinic phone number is [phone].  Please show the CHEMO ALERT CARD at check-in to the Emergency Department and triage nurse.   

## 2019-10-16 NOTE — Telephone Encounter (Signed)
I called to check on patient but did not get a response.  The cortisone injection can be effective anywhere from 2 to 4 weeks.  I which is just taking Tylenol and icing her knees.  If she has persistent pain after February then she should contact us.

## 2019-10-21 ENCOUNTER — Ambulatory Visit (HOSPITAL_COMMUNITY)
Admission: RE | Admit: 2019-10-21 | Discharge: 2019-10-21 | Disposition: A | Discharge status: Home / Self Care | Payer: Federal, State, Local not specified - PPO | Source: Ambulatory Visit | Attending: Hematology | Admitting: Hematology

## 2019-10-21 ENCOUNTER — Other Ambulatory Visit: Payer: Self-pay

## 2019-10-21 DIAGNOSIS — Z170 Estrogen receptor positive status [ER+]: Secondary | ICD-10-CM | POA: Diagnosis not present

## 2019-10-21 DIAGNOSIS — C50411 Malignant neoplasm of upper-outer quadrant of right female breast: Secondary | ICD-10-CM | POA: Diagnosis not present

## 2019-10-21 DIAGNOSIS — Z01818 Encounter for other preprocedural examination: Secondary | ICD-10-CM | POA: Insufficient documentation

## 2019-10-21 NOTE — Telephone Encounter (Signed)
Schedule for Visco Knee injections.  Please call, and schedule for Synvisc series, Bilateral Knees Buy and Bill No PA required.  A Co-pay of $40.00 for each visit is required.  Insurance will cover 70% after $150.00 co-pay.

## 2019-10-21 NOTE — Progress Notes (Signed)
Echocardiogram 2D Echocardiogram has been performed.  Oneal Deputy Pierre Cumpton 10/21/2019, 10:55 AM

## 2019-10-22 ENCOUNTER — Encounter: Payer: Self-pay | Admitting: Hematology

## 2019-10-23 ENCOUNTER — Other Ambulatory Visit: Payer: Self-pay

## 2019-10-23 ENCOUNTER — Encounter: Payer: Self-pay | Admitting: *Deleted

## 2019-10-23 DIAGNOSIS — C50411 Malignant neoplasm of upper-outer quadrant of right female breast: Secondary | ICD-10-CM

## 2019-10-23 DIAGNOSIS — Z170 Estrogen receptor positive status [ER+]: Secondary | ICD-10-CM

## 2019-10-23 DIAGNOSIS — Z95828 Presence of other vascular implants and grafts: Secondary | ICD-10-CM

## 2019-10-23 NOTE — Telephone Encounter (Signed)
I left a vm for patient to call office.

## 2019-10-23 NOTE — Telephone Encounter (Signed)
I spoke with Summer [name].  I reassured her this is not uncommon and the "thrombus" should not cause any harm.  I told her that Dr. Burr Medico is ordering a dye study to be done to evaluate the possible thrombus and remove thrombus.  I told Summer Montoya she wil be hearing from our IR department today or Monday.  She verbalized understanding.

## 2019-10-26 ENCOUNTER — Encounter: Payer: Self-pay | Admitting: Internal Medicine

## 2019-10-27 ENCOUNTER — Other Ambulatory Visit: Payer: Self-pay

## 2019-10-27 ENCOUNTER — Ambulatory Visit (HOSPITAL_COMMUNITY)
Admission: RE | Admit: 2019-10-27 | Discharge: 2019-10-27 | Disposition: A | Discharge status: Home / Self Care | Payer: Federal, State, Local not specified - PPO | Source: Ambulatory Visit | Attending: Hematology | Admitting: Hematology

## 2019-10-27 DIAGNOSIS — Z170 Estrogen receptor positive status [ER+]: Secondary | ICD-10-CM | POA: Diagnosis not present

## 2019-10-27 DIAGNOSIS — Z452 Encounter for adjustment and management of vascular access device: Secondary | ICD-10-CM | POA: Diagnosis present

## 2019-10-27 DIAGNOSIS — C50411 Malignant neoplasm of upper-outer quadrant of right female breast: Secondary | ICD-10-CM | POA: Diagnosis not present

## 2019-10-27 DIAGNOSIS — Z95828 Presence of other vascular implants and grafts: Secondary | ICD-10-CM

## 2019-10-27 HISTORY — PX: IR CV LINE INJECTION: IMG2294

## 2019-10-27 MED ORDER — HEPARIN SOD (PORK) LOCK FLUSH 100 UNIT/ML IV SOLN
INTRAVENOUS | Status: AC
  Filled 2019-10-27: qty 5

## 2019-10-27 MED ORDER — IOHEXOL 300 MG/ML  SOLN
50.0000 mL | Freq: Once | INTRAMUSCULAR | Status: AC | PRN
  Administered 2019-10-27: 15 mL via INTRAVENOUS

## 2019-10-28 ENCOUNTER — Encounter: Payer: Self-pay | Admitting: *Deleted

## 2019-10-28 ENCOUNTER — Telehealth: Payer: Self-pay | Admitting: *Deleted

## 2019-10-28 NOTE — Telephone Encounter (Signed)
Called the patient, left a detailed message requesting a call back to either schedule with Dr. Carlean Purl or an APP concerning her IDA.  MyChart message was also sent to the patient requesting a call back to schedule as well.

## 2019-10-28 NOTE — Telephone Encounter (Signed)
-----   Message from Gatha Mayer, MD sent at 10/27/2019  4:49 PM EST ----- I am not sure what happened here but we will call her to set up an appointment.  Marjory Meints - please ask her to come see me or an APP about iron deficiency anemia ----- Message ----- From: Truitt Merle, MD Sent: 01/06/2019  12:04 PM EST To: Gatha Mayer, MD  Sounds good, thanks  ----- Message ----- From: Gatha Mayer, MD Sent: 01/06/2019  11:58 AM EDT To: Patti E Martinique, CMA, Truitt Merle, MD  Krista Blue,  I will place her on my recall list for July to get her back in and when she completes chemo if you think to send me another ask that will help.  Glendell Docker ----- Message ----- From: Truitt Merle, MD Sent: 01/02/2019  10:46 AM EDT To: Gatha Mayer, MD  Hi Glendell Docker,  You did her last colonoscopy in 2013. She has diverticulosis and had partial colectomy. She started adjuvant chemo for breast cancer today, plan for 3 months. She has been having iron deficient anemia for the past year, I recently gave her iv iron, improved.  Could you see her back in 3 months, to consider repeating colonoscopy after she completes chemo?  Thanks  Krista Blue

## 2019-10-29 ENCOUNTER — Telehealth: Payer: Self-pay | Admitting: Hematology

## 2019-10-29 NOTE — Telephone Encounter (Signed)
I had to leave a message regarding reschedule due to weather

## 2019-10-30 ENCOUNTER — Other Ambulatory Visit: Payer: Self-pay

## 2019-10-30 ENCOUNTER — Inpatient Hospital Stay: Payer: Federal, State, Local not specified - PPO | Attending: Hematology

## 2019-10-30 ENCOUNTER — Encounter: Payer: Self-pay | Admitting: Internal Medicine

## 2019-10-30 ENCOUNTER — Telehealth: Payer: Self-pay | Admitting: Hematology

## 2019-10-30 ENCOUNTER — Inpatient Hospital Stay: Payer: Federal, State, Local not specified - PPO

## 2019-10-30 ENCOUNTER — Inpatient Hospital Stay: Payer: Federal, State, Local not specified - PPO | Admitting: Hematology

## 2019-10-30 ENCOUNTER — Encounter: Payer: Self-pay | Admitting: Hematology

## 2019-10-30 VITALS — BP 123/58 | HR 81 | Temp 98.7°F | Resp 18 | Ht 66.0 in | Wt 180.1 lb

## 2019-10-30 DIAGNOSIS — M797 Fibromyalgia: Secondary | ICD-10-CM | POA: Diagnosis not present

## 2019-10-30 DIAGNOSIS — R05 Cough: Secondary | ICD-10-CM | POA: Diagnosis not present

## 2019-10-30 DIAGNOSIS — G620 Drug-induced polyneuropathy: Secondary | ICD-10-CM | POA: Diagnosis not present

## 2019-10-30 DIAGNOSIS — G500 Trigeminal neuralgia: Secondary | ICD-10-CM | POA: Diagnosis not present

## 2019-10-30 DIAGNOSIS — R5383 Other fatigue: Secondary | ICD-10-CM | POA: Insufficient documentation

## 2019-10-30 DIAGNOSIS — D509 Iron deficiency anemia, unspecified: Secondary | ICD-10-CM

## 2019-10-30 DIAGNOSIS — R252 Cramp and spasm: Secondary | ICD-10-CM | POA: Diagnosis not present

## 2019-10-30 DIAGNOSIS — C50411 Malignant neoplasm of upper-outer quadrant of right female breast: Secondary | ICD-10-CM

## 2019-10-30 DIAGNOSIS — Z170 Estrogen receptor positive status [ER+]: Secondary | ICD-10-CM

## 2019-10-30 DIAGNOSIS — Z923 Personal history of irradiation: Secondary | ICD-10-CM | POA: Insufficient documentation

## 2019-10-30 DIAGNOSIS — Z452 Encounter for adjustment and management of vascular access device: Secondary | ICD-10-CM | POA: Insufficient documentation

## 2019-10-30 DIAGNOSIS — T451X5A Adverse effect of antineoplastic and immunosuppressive drugs, initial encounter: Secondary | ICD-10-CM | POA: Diagnosis not present

## 2019-10-30 DIAGNOSIS — U071 COVID-19: Secondary | ICD-10-CM | POA: Diagnosis not present

## 2019-10-30 DIAGNOSIS — M8580 Other specified disorders of bone density and structure, unspecified site: Secondary | ICD-10-CM | POA: Diagnosis not present

## 2019-10-30 DIAGNOSIS — Z9221 Personal history of antineoplastic chemotherapy: Secondary | ICD-10-CM | POA: Diagnosis not present

## 2019-10-30 DIAGNOSIS — Z79811 Long term (current) use of aromatase inhibitors: Secondary | ICD-10-CM | POA: Insufficient documentation

## 2019-10-30 DIAGNOSIS — R002 Palpitations: Secondary | ICD-10-CM | POA: Diagnosis not present

## 2019-10-30 DIAGNOSIS — Z79899 Other long term (current) drug therapy: Secondary | ICD-10-CM | POA: Diagnosis not present

## 2019-10-30 DIAGNOSIS — M25519 Pain in unspecified shoulder: Secondary | ICD-10-CM | POA: Insufficient documentation

## 2019-10-30 DIAGNOSIS — Z791 Long term (current) use of non-steroidal anti-inflammatories (NSAID): Secondary | ICD-10-CM | POA: Insufficient documentation

## 2019-10-30 DIAGNOSIS — M170 Bilateral primary osteoarthritis of knee: Secondary | ICD-10-CM | POA: Insufficient documentation

## 2019-10-30 DIAGNOSIS — Z95828 Presence of other vascular implants and grafts: Secondary | ICD-10-CM

## 2019-10-30 LAB — CBC WITH DIFFERENTIAL/PLATELET
Abs Immature Granulocytes: 0.04 10*3/uL (ref 0.00–0.07)
Basophils Absolute: 0 10*3/uL (ref 0.0–0.1)
Basophils Relative: 0 %
Eosinophils Absolute: 0.2 10*3/uL (ref 0.0–0.5)
Eosinophils Relative: 3 %
HCT: 39.6 % (ref 36.0–46.0)
Hemoglobin: 12.6 g/dL (ref 12.0–15.0)
Immature Granulocytes: 1 %
Lymphocytes Relative: 24 %
Lymphs Abs: 1.4 10*3/uL (ref 0.7–4.0)
MCH: 29 pg (ref 26.0–34.0)
MCHC: 31.8 g/dL (ref 30.0–36.0)
MCV: 91 fL (ref 80.0–100.0)
Monocytes Absolute: 0.3 10*3/uL (ref 0.1–1.0)
Monocytes Relative: 6 %
Neutro Abs: 3.8 10*3/uL (ref 1.7–7.7)
Neutrophils Relative %: 66 %
Platelets: 147 10*3/uL — ABNORMAL LOW (ref 150–400)
RBC: 4.35 MIL/uL (ref 3.87–5.11)
RDW: 12.9 % (ref 11.5–15.5)
WBC: 5.7 10*3/uL (ref 4.0–10.5)
nRBC: 0 % (ref 0.0–0.2)

## 2019-10-30 LAB — COMPREHENSIVE METABOLIC PANEL
ALT: 15 U/L (ref 0–44)
AST: 26 U/L (ref 15–41)
Albumin: 3.2 g/dL — ABNORMAL LOW (ref 3.5–5.0)
Alkaline Phosphatase: 88 U/L (ref 38–126)
Anion gap: 8 (ref 5–15)
BUN: 11 mg/dL (ref 8–23)
CO2: 30 mmol/L (ref 22–32)
Calcium: 8.4 mg/dL — ABNORMAL LOW (ref 8.9–10.3)
Chloride: 104 mmol/L (ref 98–111)
Creatinine, Ser: 0.68 mg/dL (ref 0.44–1.00)
GFR calc Af Amer: >60 mL/min (ref >60)
GFR calc non Af Amer: >60 mL/min (ref >60)
Glucose, Bld: 95 mg/dL (ref 70–99)
Potassium: 3.4 mmol/L — ABNORMAL LOW (ref 3.5–5.1)
Sodium: 142 mmol/L (ref 135–145)
Total Bilirubin: 0.5 mg/dL (ref 0.3–1.2)
Total Protein: 6.7 g/dL (ref 6.5–8.1)

## 2019-10-30 LAB — IRON AND TIBC
Iron: 15 ug/dL — ABNORMAL LOW (ref 41–142)
Saturation Ratios: 5 % — ABNORMAL LOW (ref 21–57)
TIBC: 280 ug/dL (ref 236–444)
UIBC: 266 ug/dL (ref 120–384)

## 2019-10-30 LAB — FERRITIN: Ferritin: 56 ng/mL (ref 11–307)

## 2019-10-30 MED ORDER — SODIUM CHLORIDE 0.9% FLUSH
10.0000 mL | Freq: Once | INTRAVENOUS | Status: AC
  Administered 2019-10-30: 10 mL
  Filled 2019-10-30: qty 10

## 2019-10-30 MED ORDER — HEPARIN SOD (PORK) LOCK FLUSH 100 UNIT/ML IV SOLN
500.0000 [IU] | Freq: Once | INTRAVENOUS | Status: AC
  Administered 2019-10-30: 500 [IU]
  Filled 2019-10-30: qty 5

## 2019-10-30 NOTE — Progress Notes (Signed)
Summer [name]   [phone] [phone]   Clinic Follow up Note   Patient Care Team: Hoyt Koch, MD as PCP - General (Internal Medicine) Sueanne Margarita, MD as PCP - Cardiology (Cardiology) Mauro Kaufmann, RN as Oncology Nurse Navigator Rockwell Germany, RN as Oncology Nurse Navigator Excell Seltzer, MD (Inactive) as Consulting Physician (General Surgery) Truitt Merle, MD as Consulting Physician (Hematology) Gery Pray, MD as Consulting Physician (Radiation Oncology)  Date of Service:  10/30/2019  CHIEF COMPLAINT: F/u of right breast cancer  SUMMARY OF ONCOLOGIC HISTORY: Oncology History Overview Note  Cancer Staging Malignant neoplasm of upper-outer quadrant of right breast in female, estrogen receptor positive ([place]) Staging form: Breast, AJCC 8th Edition - Clinical stage from 11/10/2018: Stage IA (cT1b, cN0, cM0, G2, ER+, PR+, HER2+) - Signed by Truitt Merle, MD on 11/18/2018     Malignant neoplasm of upper-outer quadrant of right breast in female, estrogen receptor positive ([place])  11/07/2018 Mammogram   Diagnostic 11/07/18 IMPRESSION: 1. 1 x 0.7 x 0.6 cm hypoechoic mass with distortion at the 9-9:30 position of the RIGHT breast 6 cm from the nipple with distortion in the UPPER-OUTER RIGHT breast, corresponding to the mammographic abnormality. One RIGHT axillary lymph node with borderline cortical thickness. Tissue sampling of the RIGHT breast mass and borderline RIGHT axillary lymph node recommended. ADDENDUM: Patient returned today for biopsy of a single borderline thickened lymph node in the RIGHT axilla. I could not reproduce a morphologically abnormal lymph node today, possibly resolved in the interval. Today, only normal appearing lymph nodes were identified in the RIGHT axilla. As such, the axillary ultrasound-guided biopsy was canceled.   11/10/2018 Cancer Staging   Staging form: Breast, AJCC 8th Edition - Clinical stage  from 11/10/2018: Stage IA (cT1b, cN0, cM0, G2, ER+, PR+, HER2+) - Signed by Truitt Merle, MD on 11/18/2018   11/11/2018 Initial Biopsy   Diagnosis 11/11/18 Breast, right, needle core biopsy, upper outer - 9:30 o'clock position - INVASIVE DUCTAL CARCINOMA, GRADE II/III. - SEE MICROSCOPIC DESCRIPTION.   11/11/2018 Receptors her2   Results: IMMUNOHISTOCHEMICAL AND MORPHOMETRIC ANALYSIS PERFORMED MANUALLY The tumor cells are POSITIVE for Her2 (3+). Estrogen Receptor: 95%, POSITIVE, STRONG STAINING INTENSITY Progesterone Receptor: 5%, POSITIVE, STRONG STAINING INTENSITY Proliferation Marker Ki67: 20%   11/17/2018 Initial Diagnosis   Malignant neoplasm of upper-outer quadrant of right breast in female, estrogen receptor positive ([place])   11/28/2018 Surgery   RIGHT BREAST LUMPECTOMY WITH RADIOACTIVE SEED AND RIGHT AXILLARY SENTINEL LYMPH NODE BIOPSY by Dr. Excell Seltzer  11/28/18    11/28/2018 Pathology Results   Diagnosis 11/28/18  1. Breast, lumpectomy, Right w/seed - INVASIVE DUCTAL CARCINOMA, 1.6 CM, NOTTINGHAM GRADE 2 OF 3. - MARGINS OF RESECTION ARE NOT INVOLVED (CLOSEST MARGIN: LESS THAN 1 MM, ANTERIOR). - DUCTAL CARCINOMA IN SITU. - BIOPSY SITE CHANGES. - SEE ONCOLOGY TABLE. 2. Lymph node, sentinel, biopsy, right Axillary - ONE LYMPH NODE, NEGATIVE FOR CARCINOMA (0/1). 3. Lymph node, sentinel, biopsy, right Axillary - ONE LYMPH NODE, NEGATIVE FOR CARCINOMA (0/1).   11/28/2018 Cancer Staging   Staging form: Breast, AJCC 8th Edition - Pathologic stage from 11/28/2018: Stage IA (pT1c, pN0, cM0, G2, ER+, PR+, HER2+) - Signed by Truitt Merle, MD on 12/11/2018   12/22/2018 Procedure   Baseline ECHO 12/22/18  IMPRESSIONS  1. The left ventricle has normal systolic function with an ejection fraction of 60-65%. The cavity size was normal. There is mild concentric left ventricular hypertrophy. Left ventricular diastolic parameters were normal.  2. The right ventricle has normal systolic function. The cavity was  normal. There is no increase in right ventricular wall thickness.  3. Left atrial size was moderately dilated.  4. The aortic valve is tricuspid. Aortic valve regurgitation was not assessed by color flow Doppler.    01/02/2019 -  Chemotherapy   Adjuvant chemo weekly Taxol with Herceptin starting 01/02/19. Will d/c after 02/13/19 and will switch to Kadcyla q3weeks starting 02/20/19 due to worsening neuropathy. Due to worsening Muscle cramps Kadcyla stopped 04/03/19 and switched to Herceptin q3weeks on 04/23/19.    04/09/2019 -  Radiation Therapy   RT with Dr. Sondra Come 04/09/19-05/28/19   06/29/2019 -  Anti-estrogen oral therapy   Exemestane 30m daily starting 06/29/19      CURRENT THERAPY:  -Adjuvant chemo weekly Taxol with Herceptin starting 01/02/19. Will d/c after 02/13/19 and will switch to Kadcyla q3weeks starting 6/12/20due to worsening neuropathy. Due to worsening Muscle cramps will switch Kadcyla to Herceptin on 04/23/19 -IV iron as needed -Exemestane 240mdaily starting 06/29/19   INTERVAL HISTORY:  Summer [name] here for a follow up. She presents to the clinic alone. She notes she recently has been very tired and exhausted for the past 2 weeks. She notes she stayed home from work yesterday and today. 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 She denies SOB with activity. She also notes occasional cough with clear thick phlegm. She notes she sleep partially sitting up and if she wear to lay flat she will have discomfort in her mid chest. She notes she still has taste and smell but thinks taste more salty or sweet. She denies fever. She notes she has reached out to Dr TuRadford Paxut not sure if she ever had consult with her.  She notes worsening of numbness of left toes and notes one of her toes are crossing.    REVIEW OF SYSTEMS:   Constitutional: Denies fevers, chills or abnormal weight loss (+) increased fatigue Eyes: Denies blurriness of vision Ears, nose,  mouth, throat, and face: Denies mucositis or sore throat Respiratory: Denies dyspnea or wheezes (+) Occasional cough with clear thick phlegm.  Cardiovascular: Denies chest discomfort or lower extremity swelling (+) Mid chest palpitations  Gastrointestinal:  Denies nausea, heartburn or change in bowel habits Skin: Denies abnormal skin rashes Lymphatics: Denies new lymphadenopathy or easy bruising Neurological: (+) numbness of toes, mainly left Behavioral/Psych: Mood is stable, no new changes  All other systems were reviewed with the patient and are negative.  MEDICAL HISTORY:  Past Medical History:  Diagnosis Date  . Abdominal pain, left lower quadrant 11/09/2009   Qualifier: Diagnosis of  By: NeValma CavaPN, NaIzora Gala  . Allergic rhinitis 08/26/2008   Qualifier: Diagnosis of  By: ToSherren MochaD, JeJory Ee . Anemia, iron deficiency 11/21/2018  . Arthritis shoulders and back  . BACK PAIN, CHRONIC 06/13/2007   Qualifier: Diagnosis of  By: ToSherren MochaD, JeJory Ee . 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 Constipation 07/01/2018  . Costochondritis 05/04/2014  . Diverticulitis of colon 06/13/2007   S/p segmental colectomy for perforated diverticulitis   . Diverticulosis   . Facial pain 10/06/2015  . Fibromyalgia   . Foot sprain, left, initial encounter 08/31/2016  . Glossitis 02/02/2016  . Headache 04/24/2016  . HEMATURIA UNSPECIFIED 06/25/2008   Qualifier: Diagnosis of  By: ToSherren MochaD, JeJory Ee . HEMATURIA,  HX OF 11/14/2009   Qualifier: Diagnosis of  By: Regino Schultze CMA ([place]), Apolonio Schneiders    . History of kidney stones   . History of kidney stones   . History of shingles 2012--  no residual pain  . Itching 10/13/2018  . Left eye pain 07/01/2018  . Left ureteral calculus   . LLQ pain 04/24/2016  . LOC OSTEOARTHROS NOT SPEC PRIM/SEC OTH [facility] SITE 06/27/2009   Qualifier: Diagnosis of  By: Sherren Mocha MD, Jory Ee   . Malignant neoplasm of upper-outer quadrant of right  breast in female, estrogen receptor positive ([place]) 11/17/2018  . MCI (mild cognitive impairment)   . Memory loss 12/21/2016  . Morbid obesity ([place]) 11/09/2010  . Pain of right side of body 08/31/2016  . Pruritus 10/26/2016  . Right sided temporal headache 12/21/2016  . Tremor 12/21/2016  . Trigeminal neuralgia RIGHT  . Trigeminal neuralgia of right side of face 11/30/2010  . Trochanteric bursitis, right hip 10/26/2016  . Urgency of urination   . Weight loss 07/22/2017  . Zoster without complications 01/25/6159    SURGICAL HISTORY: Past Surgical History:  Procedure Laterality Date  . BREAST LUMPECTOMY WITH RADIOACTIVE SEED AND SENTINEL LYMPH NODE BIOPSY Right 11/28/2018   Procedure: RIGHT BREAST LUMPECTOMY WITH RADIOACTIVE SEED AND RIGHT AXILLARY SENTINEL LYMPH NODE BIOPSY;  Surgeon: Excell Seltzer, MD;  Location: [facility];  Service: General;  Laterality: Right;  . CEREBRAL MICROVASCULAR DECOMPRESSION  07-05-2006   RIGHT TRIGEMINAL NERVE  . CYSTOSCOPY/RETROGRADE/URETEROSCOPY/STONE EXTRACTION WITH BASKET  07/04/2012   Procedure: CYSTOSCOPY/RETROGRADE/URETEROSCOPY/STONE EXTRACTION WITH BASKET;  Surgeon: [place] Jabs, MD;  Location: [facility];  Service: Urology;  Laterality: Left;  . EXPLORATORY LAPAROTOMY/ RESECTION MID TO DISTAL SIGMOID AND PROXIMAL RECTUM/ END PROXIMAL SIGMOID COLOSTOMY  07-17-2006   PERFORATED DIVERTICULITIS WITH PERITONITIS  . gamma knife  05/17/2016   for trigeminal neuralgia, [facility], Dr Salomon Fick  . gamma knife  2019  . IR CV LINE INJECTION  10/27/2019  . PORTACATH PLACEMENT Left 11/28/2018   Procedure: INSERTION PORT-A-CATH WITH ULTRASOUND;  Surgeon: Excell Seltzer, MD;  Location: [facility];  Service: General;  Laterality: Left;  . RESECTION COLOSTOMY/ CLOSURE COLOSTOMY WITH COLOPROCTOSTOMY  02-20-2007  . TONSILLECTOMY  AS CHILD  . URETEROSCOPY  07/04/2012   Procedure: URETEROSCOPY;  Surgeon: [place] Jabs, MD;  Location: [facility];   Service: Urology;  Laterality: Left;  Marland Kitchen VAGINAL HYSTERECTOMY  1998   Partial  . WISDOM TOOTH EXTRACTION      I have reviewed the social history and family history with the patient and they are unchanged from previous note.  ALLERGIES:  is allergic to clindamycin hcl.  MEDICATIONS:  Current Outpatient Medications  Medication Sig Dispense Refill  . acetaminophen (TYLENOL) 500 MG tablet Take 1,000 mg by mouth every 6 (six) hours as needed.    . diclofenac Sodium (VOLTAREN) 1 % GEL Apply 2 g topically 4 (four) times daily. 100 g 5  . DULoxetine (CYMBALTA) 60 MG capsule TAKE 1 CAPSULE BY MOUTH EVERY DAY 90 capsule 3  . exemestane (AROMASIN) 25 MG tablet TAKE 1 TABLET (25 MG TOTAL) BY MOUTH DAILY AFTER BREAKFAST. 90 tablet 1  . gabapentin (NEURONTIN) 600 MG tablet TAKE 2 TABLETS TID. 540 tablet 3   No current facility-administered medications for this visit.    PHYSICAL EXAMINATION: ECOG PERFORMANCE STATUS: 2 - Symptomatic, <50% confined to bed  Vitals:   10/30/19 1311  BP: (!) 123/58  Pulse: 81  Resp: 18  Temp:  98.7 F (37.1 C)   Filed Weights   10/30/19 1311  Weight: 180 lb 1.6 oz (81.7 kg)    GENERAL:alert, no distress and comfortable SKIN: skin color, texture, turgor are normal, no rashes or significant lesions EYES: normal, Conjunctiva are pink and non-injected, sclera clear  NECK: supple, thyroid normal size, non-tender, without nodularity LYMPH:  no palpable lymphadenopathy in the cervical, axillary  LUNGS: clear to auscultation and percussion with normal breathing effort HEART: regular rate & rhythm and no murmurs and no lower extremity edema ABDOMEN:abdomen soft, non-tender and normal bowel sounds Musculoskeletal:no cyanosis of digits and no clubbing  NEURO: alert & oriented x 3 with fluent speech, no focal motor/sensory deficits BREAST: s/p left lumpectomy: Surgical incision healed well. (+) skin related nodule of right lateral breast. No palpable mass, nodules or  adenopathy bilaterally. Breast exam benign.   LABORATORY DATA:  I have reviewed the data as listed CBC Latest Ref Rng & Units 10/30/2019 08/28/2019 08/07/2019  WBC 4.0 - 10.5 K/uL 5.7 4.8 4.6  Hemoglobin 12.0 - 15.0 g/dL 12.6 12.3 12.4  Hematocrit 36.0 - 46.0 % 39.6 39.1 39.4  Platelets 150 - 400 K/uL 147(L) 206 202     CMP Latest Ref Rng & Units 10/30/2019 08/28/2019 08/07/2019  Glucose 70 - 99 mg/dL 95 92 140(H)  BUN 8 - 23 mg/dL 11 12 13   Creatinine 0.44 - 1.00 mg/dL 0.68 0.73 0.78  Sodium 135 - 145 mmol/L 142 141 140  Potassium 3.5 - 5.1 mmol/L 3.4(L) 4.1 3.7  Chloride 98 - 111 mmol/L 104 105 104  CO2 22 - 32 mmol/L 30 29 26   Calcium 8.9 - 10.3 mg/dL 8.4(L) 8.6(L) 8.6(L)  Total Protein 6.5 - 8.1 g/dL 6.7 6.5 6.6  Total Bilirubin 0.3 - 1.2 mg/dL 0.5 0.8 1.4(H)  Alkaline Phos 38 - 126 U/L 88 110 101  AST 15 - 41 U/L 26 19 21   ALT 0 - 44 U/L 15 12 15       RADIOGRAPHIC STUDIES: I have personally reviewed the radiological images as listed and agreed with the findings in the report. No results found.   ASSESSMENT & PLAN:  Nou Chard is a 69 y.o. female with   1.Malignant neoplasm of upper-outer quadrant of right breast, StageIA,pT1b,N0,M0, ER/PR+, HER2+, GradeII -She was diagnosed in 11/2018. She is s/p right breastlumpectomyand SLNB  and adjuvant radiation.  -She startedadjuvantweekly Taxol with Herceptinon4/24/20butstopped after 6 weeks therapydue to worsening neuropathyand infusion reaction. She also tried Kadcyla but due to worsening muscle cramps she was changed to Herceptin infusion q3weeks starting 04/23/19. If she tolerates we will continueanti-her2 treatmentuntil 12/2019. -I started her on antiestrogen therapy with Exemestane in 06/2019. She has tolerated moderately due to known joint pain worsening in shoulders and knees. She is willing to continue for now.  -She recently has been having cough and significant fatigue. She has had COVID19 test  yesterday and results pending. Labs reviewed, CBC and CMP WNL except plt 147K  . Her anemia resolved. Given her pending COVID19 test, will postpone Herceptin from tomorrow to next week if she is COVID19 negative.  -I discussed option of Herceptin injections so she does not have to use her port. She declined.  -Her 10/2019 ECHO was adequate.  -Continue Exemestane -Next Mammogram on 11/02/19 -F/u in 9 weeks   2. Significant Muscle Cramps, Diffuse body pain, joint pain/arthritis  possible related to chemo, improved lately  -She has been seen by her neurologist Dr Krista Blue who increased herGabapentinto 657m 7  tabs a day. She was to have nerve study in 07/2019 -She does have arthritis progression due to Exemestane with b/l knee and shoulder pain. I previously recommend she start OTC Glucosamine and increase exercise to help her pain.   3. Anemia,iron deficiency -Last colonoscopy was in 04/2012 which wasunremarkable except diverticulosis.  -She had partial colectomy in the past due to severe diverticulosis. -This is possiblyiron deficiency from diverticulosis.She denies any current bleeding.  -Her workup in early 2020 showed iron 17, ferritin 4, normal retic ct, folate Vitamin B12 and SPEP panels. -she received iv iron and anemia has resolved   -Dr. Carlean Purl is open to have Colonoscopy for further evaluation. I recommend she proceed.  -IV iron was given 3/18/20and 12/12/18. Has been resolved lately  4. Trigeminal neuralgia  -5/26/20brain and neck MRIwere stable. -She has been having moderate right side HA which I suspect is exacerbation of her neuralgia secondary to chemo. -Has not been completely controlled but managing with occasionally worsened flares.  -She has oxycodone which she can use for significant pain. Otherwise she is on Gabapentin 640m 7 tabs a day.  5.PeripheralNeuropathy, G1-2 -Secondary to chemoTaxolstarted aftercycle 3. -Taxol switched toTDM-1 KDemetrius Charity 6/12/20which was switched to Herceptin on 04/23/19 -On Robaxin, B12 and Gabapentin.  -Her neuropathyhas overall improved in her feet, with still has significant numbness of 2 left toes and tinglingin her fingertips. -With numbness of her left toes she feels they are crossing. I recommend she f/u with her podiatrist.   6. Osteopenia  -Her baseline DEXA from 07/2019 shows osteopenia with lowest T-score -1.1 at left hip.  -Will continue to monitor on AI as this can weaken her bones. Repeat every 2 years.  -I recommend she start OTC calcium and Vitamin D.   7. Cough, worsening fatigue  -She has recently had cough with clear thick phlegm. She notes being significantly fatigued for the past 2 weeks. She denies fever.  -She notes she was tested for COVID19 yesterday at [facility] and does not have results yet. She will let clinic know results. If positive we will postpone treatment.  -Normal lung and heart function on exam today (10/30/19).  -She is fine to proceed with COVID19 vaccine after her current symptoms resolve.  -She also has been having mid chest palpitations. She has not been formally seen by Dr TRadford Paxyet. -If COVID19 negative and fatigue does not improve she can do thyroid work up with her PCP. I does not have high suspicion for breast cancer recurrence  -Per patient request I will write letter for her to be off work next week given symptoms.    PLAN: -Proceed with Colonoscopy by Dr. GCarlean Purlsoon  -Continue exemestane -Flush, andHerceptinnext week and in 3, 6 and 9 weeks after  -Lab and F/u in9 weeks -Mammogram in 11/2019 -Per request I write letter for her job to be off next week.  -she will inform u[place]if her COVID test came back positive    No problem-specific Assessment & Plan notes found for this encounter.   No orders of the defined types were placed in this encounter.  All questions were answered. The patient knows to call the clinic with any problems, questions or  concerns. No barriers to learning was detected. The total time spent in the appointment was 30 minutes.     YTruitt Merle MD 10/30/2019   I, AJoslyn Devon am acting as scribe for YTruitt Merle MD.   I have reviewed the above documentation for accuracy and completeness,  and I agree with the above.   Addendum  Pt informed that her COVID test from yesterday was positive, she has reached out to her PCP Dr. Sharlet Salina for COVID management. She had no hypoxia in our office.   I will postpone her Herceptin treatment for 3 weeks.  Truitt Merle  10/30/2019

## 2019-10-30 NOTE — Telephone Encounter (Signed)
I talk with patient regarding her schedule and she stated she was normally on friday

## 2019-10-31 ENCOUNTER — Encounter: Payer: Self-pay | Admitting: Internal Medicine

## 2019-10-31 ENCOUNTER — Inpatient Hospital Stay: Payer: Federal, State, Local not specified - PPO

## 2019-11-02 ENCOUNTER — Emergency Department (HOSPITAL_COMMUNITY): Payer: Federal, State, Local not specified - PPO

## 2019-11-02 ENCOUNTER — Emergency Department (HOSPITAL_COMMUNITY)
Admission: EM | Admit: 2019-11-02 | Discharge: 2019-11-02 | Disposition: A | Discharge status: Home / Self Care | Payer: Federal, State, Local not specified - PPO | Attending: Emergency Medicine | Admitting: Emergency Medicine

## 2019-11-02 ENCOUNTER — Encounter (HOSPITAL_COMMUNITY): Payer: Self-pay | Admitting: Emergency Medicine

## 2019-11-02 ENCOUNTER — Other Ambulatory Visit: Payer: Self-pay

## 2019-11-02 DIAGNOSIS — U071 COVID-19: Secondary | ICD-10-CM | POA: Diagnosis not present

## 2019-11-02 DIAGNOSIS — R079 Chest pain, unspecified: Secondary | ICD-10-CM

## 2019-11-02 DIAGNOSIS — Z79899 Other long term (current) drug therapy: Secondary | ICD-10-CM | POA: Diagnosis not present

## 2019-11-02 DIAGNOSIS — R0789 Other chest pain: Secondary | ICD-10-CM | POA: Diagnosis present

## 2019-11-02 LAB — BASIC METABOLIC PANEL
Anion gap: 10 (ref 5–15)
BUN: 11 mg/dL (ref 8–23)
CO2: 30 mmol/L (ref 22–32)
Calcium: 9.1 mg/dL (ref 8.9–10.3)
Chloride: 103 mmol/L (ref 98–111)
Creatinine, Ser: 0.58 mg/dL (ref 0.44–1.00)
GFR calc Af Amer: >60 mL/min (ref >60)
GFR calc non Af Amer: >60 mL/min (ref >60)
Glucose, Bld: 94 mg/dL (ref 70–99)
Potassium: 2.9 mmol/L — ABNORMAL LOW (ref 3.5–5.1)
Sodium: 143 mmol/L (ref 135–145)

## 2019-11-02 LAB — TROPONIN I (HIGH SENSITIVITY): Troponin I (High Sensitivity): 3 ng/L (ref <18)

## 2019-11-02 LAB — CBC
HCT: 38.4 % (ref 36.0–46.0)
Hemoglobin: 12.1 g/dL (ref 12.0–15.0)
MCH: 28.7 pg (ref 26.0–34.0)
MCHC: 31.5 g/dL (ref 30.0–36.0)
MCV: 91.2 fL (ref 80.0–100.0)
Platelets: 229 10*3/uL (ref 150–400)
RBC: 4.21 MIL/uL (ref 3.87–5.11)
RDW: 12.7 % (ref 11.5–15.5)
WBC: 5.6 10*3/uL (ref 4.0–10.5)
nRBC: 0 % (ref 0.0–0.2)

## 2019-11-02 LAB — D-DIMER, QUANTITATIVE: D-Dimer, Quant: 2.23 ug/mL-FEU — ABNORMAL HIGH (ref 0.00–0.50)

## 2019-11-02 MED ORDER — HEPARIN SOD (PORK) LOCK FLUSH 100 UNIT/ML IV SOLN
INTRAVENOUS | Status: AC
  Administered 2019-11-02: 500 [IU]
  Filled 2019-11-02: qty 5

## 2019-11-02 MED ORDER — SODIUM CHLORIDE 0.9% FLUSH
3.0000 mL | Freq: Once | INTRAVENOUS | Status: AC
  Administered 2019-11-02: 3 mL via INTRAVENOUS

## 2019-11-02 MED ORDER — SODIUM CHLORIDE (PF) 0.9 % IJ SOLN
INTRAMUSCULAR | Status: AC
  Filled 2019-11-02: qty 50

## 2019-11-02 MED ORDER — HEPARIN SOD (PORK) LOCK FLUSH 100 UNIT/ML IV SOLN: 500.0000 [IU] | Freq: Once | INTRAVENOUS | Status: AC

## 2019-11-02 MED ORDER — IOHEXOL 350 MG/ML SOLN
80.0000 mL | Freq: Once | INTRAVENOUS | Status: AC | PRN
  Administered 2019-11-02: 80 mL via INTRAVENOUS

## 2019-11-02 NOTE — Telephone Encounter (Signed)
I spoke with patient in reference to scheduling Visco injections. Patient does not want to schedule injections due to amount she will be responsible for with insurance. Patient will call to schedule injections at a later date, if she changes her mind.

## 2019-11-02 NOTE — ED Notes (Signed)
Patient remained 99% RA while ambulating.

## 2019-11-02 NOTE — ED Notes (Signed)
Patient transported to CT 

## 2019-11-02 NOTE — Discharge Instructions (Signed)
Recommend follow-up with primary doctor regarding ongoing management of your COVID-19.  You can be set up for a virtual visit for recheck this week.  Return to ER if you develop any difficulty in breathing, worsening chest pain, other new concerning symptoms.  Recommend Tylenol Motrin as needed for pain control.

## 2019-11-02 NOTE — ED Triage Notes (Signed)
Pt complaint of ongoing cough and chest discomfort with movement/talking; patient tested and positive for COVID 2/17. Pt also verbalizes diarrhea and gas.

## 2019-11-03 ENCOUNTER — Telehealth: Payer: Self-pay | Admitting: Nurse Practitioner

## 2019-11-03 ENCOUNTER — Encounter: Payer: Self-pay | Admitting: *Deleted

## 2019-11-03 NOTE — Telephone Encounter (Signed)
Called to Discuss with patient about Covid symptoms and the use of bamlanivimab, a monoclonal antibody infusion for those with mild to moderate Covid symptoms and at a high risk of hospitalization.     Pt is qualified for this infusion at the [facility] infusion center due to co-morbid conditions and/or a member of an at-risk group.     Unable to reach pt - left mesage

## 2019-11-03 NOTE — ED Provider Notes (Signed)
[facility] DE[facility] Provider Note   CSN: [id number] Arrival date & time: 11/02/19  1536     History Chief Complaint  Patient presents with  . COVID  . Chest Pain    Summer [name] is a 69 y.o. female.  Breast cancer, followed by Dr. Burr Medico.  Recent diagnosis COVID-19 2/17.  Presents to ER with complaints of chest pain.  Patient states thus far she has had relatively mild symptoms, has had generalized weakness, intermittent episodes of loose stools and bad gas.  No abdominal pain.  States after she has episodes of coughing spells develops some chest discomfort.  No ongoing chest pain at this time.  No alleviating factors except for stopping the cough.  No fever.  HPI     Past Medical History:  Diagnosis Date  . Abdominal pain, left lower quadrant 11/09/2009   Qualifier: Diagnosis of  By: Valma Cava LPN, Izora Gala    . Allergic rhinitis 08/26/2008   Qualifier: Diagnosis of  By: Sherren Mocha MD, Jory Ee   . Anemia, iron deficiency 11/21/2018  . Arthritis shoulders and back  . BACK PAIN, CHRONIC 06/13/2007   Qualifier: Diagnosis of  By: Sherren Mocha MD, Jory Ee   . 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 Constipation 07/01/2018  . Costochondritis 05/04/2014  . Diverticulitis of colon 06/13/2007   S/p segmental colectomy for perforated diverticulitis   . Diverticulosis   . Facial pain 10/06/2015  . Fibromyalgia   . Foot sprain, left, initial encounter 08/31/2016  . Glossitis 02/02/2016  . Headache 04/24/2016  . HEMATURIA UNSPECIFIED 06/25/2008   Qualifier: Diagnosis of  By: Sherren Mocha MD, Jory Ee   . HEMATURIA, HX OF 11/14/2009   Qualifier: Diagnosis of  By: Regino Schultze CMA (AAMA), Apolonio Schneiders    . History of kidney stones   . History of kidney stones   . History of shingles 2012--  no residual pain  . Itching 10/13/2018  . Left eye pain 07/01/2018  . Left ureteral calculus   . LLQ pain 04/24/2016  . 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 Malignant neoplasm of upper-outer quadrant of right breast in female, estrogen receptor positive ([place]) 11/17/2018  . MCI (mild cognitive impairment)   . Memory loss 12/21/2016  . Morbid obesity ([place]) 11/09/2010  . Pain of right side of body 08/31/2016  . Pruritus 10/26/2016  . Right sided temporal headache 12/21/2016  . Tremor 12/21/2016  . Trigeminal neuralgia RIGHT  . Trigeminal neuralgia of right side of face 11/30/2010  . Trochanteric bursitis, right hip 10/26/2016  . Urgency of urination   . Weight loss 07/22/2017  . Zoster without complications [id number]    Patient Active Problem List   Diagnosis Date Noted  . Muscle cramps 05/12/2019  . Cramps, muscle, general 04/23/2019  . Tinnitus 04/16/2019  . Chest pain of uncertain etiology [id number]  . Bilateral leg edema 02/10/2019  . Port-A-Cath in place 01/30/2019  . Anemia, iron deficiency 11/21/2018  . Malignant neoplasm of upper-outer quadrant of right breast in female, estrogen receptor positive ([place]) 11/17/2018  . Itching 10/13/2018  . Left eye pain 07/01/2018  . Constipation 07/01/2018  . Weight loss 07/22/2017  . Memory loss 12/21/2016  . Tremor 12/21/2016  . Right sided temporal headache 12/21/2016  . Trochanteric bursitis, right hip 10/26/2016  . Pruritus 10/26/2016  . Foot sprain, left,  initial encounter 08/31/2016  . Pain of right side of body 08/31/2016  . Headache 04/24/2016  . LLQ pain 04/24/2016  . Glossitis 02/02/2016  . Routine general medical examination at a health care facility 10/27/2015  . Facial pain 10/06/2015  . Trigeminal neuralgia of right side of face 11/30/2010  . Morbid obesity ([place]) 11/09/2010  . Fibromyalgia 11/09/2010  . HEMATURIA, HX OF 11/14/2009  . LOC OSTEOARTHROS NOT SPEC PRIM/SEC OTH [facility] SITE 06/27/2009  . Allergic rhinitis 08/26/2008  . 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 BREAST LUMPECTOMY WITH RADIOACTIVE SEED AND SENTINEL LYMPH NODE BIOPSY Right 11/28/2018   Procedure: RIGHT BREAST LUMPECTOMY WITH RADIOACTIVE SEED AND RIGHT AXILLARY SENTINEL LYMPH NODE BIOPSY;  Surgeon: Excell Seltzer, MD;  Location: [facility];  Service: General;  Laterality: Right;  . CEREBRAL MICROVASCULAR DECOMPRESSION  07-05-2006   RIGHT TRIGEMINAL NERVE  . CYSTOSCOPY/RETROGRADE/URETEROSCOPY/STONE EXTRACTION WITH BASKET  07/04/2012   Procedure: CYSTOSCOPY/RETROGRADE/URETEROSCOPY/STONE EXTRACTION WITH BASKET;  Surgeon: Claybon Jabs, MD;  Location: [facility];  Service: Urology;  Laterality: Left;  . EXPLORATORY LAPAROTOMY/ RESECTION MID TO DISTAL SIGMOID AND PROXIMAL RECTUM/ END PROXIMAL SIGMOID COLOSTOMY  07-17-2006   PERFORATED DIVERTICULITIS WITH PERITONITIS  . gamma knife  05/17/2016   for trigeminal neuralgia, [facility], Dr Salomon Fick  . gamma knife  2019  . IR CV LINE INJECTION  10/27/2019  . PORTACATH PLACEMENT Left 11/28/2018   Procedure: INSERTION PORT-A-CATH WITH ULTRASOUND;  Surgeon: Excell Seltzer, MD;  Location: [place];  Service: General;  Laterality: Left;  . RESECTION COLOSTOMY/ CLOSURE COLOSTOMY WITH COLOPROCTOSTOMY  02-20-2007  . TONSILLECTOMY  AS CHILD  . URETEROSCOPY  07/04/2012   Procedure: URETEROSCOPY;  Surgeon: Claybon Jabs, MD;  Location: [facility];  Service: Urology;  Laterality: Left;  Marland Kitchen VAGINAL HYSTERECTOMY  1998   Partial  . WISDOM TOOTH EXTRACTION       OB History   No obstetric history on file.     Family History  Problem Relation Age of Onset  . Heart disease Maternal Grandmother   . Diabetes Mother   . Breast cancer Maternal Aunt   . Lupus Cousin   . Colon cancer Neg Hx   . Esophageal cancer Neg Hx   . Rectal cancer Neg Hx   . Stomach cancer Neg Hx     Social History   Tobacco Use  . Smoking status: Never Smoker  . Smokeless tobacco: Never Used    Substance Use Topics  . Alcohol use: Yes    Comment: rarely  . Drug use: No    Home Medications Prior to Admission medications   Medication Sig Start Date End Date Taking? Authorizing Provider  acetaminophen (TYLENOL) 500 MG tablet Take 1,000 mg by mouth every 6 (six) hours as needed.    [provider]  diclofenac Sodium (VOLTAREN) 1 % GEL Apply 2 g topically 4 (four) times daily. 08/03/19   Sater, Nanine Means, MD  DULoxetine (CYMBALTA) 60 MG capsule TAKE 1 CAPSULE BY MOUTH EVERY DAY 08/27/19   Marcial Pacas, MD  exemestane (AROMASIN) 25 MG tablet TAKE 1 TABLET (25 MG TOTAL) BY MOUTH DAILY AFTER BREAKFAST. 09/21/19   Truitt Merle, MD  gabapentin (NEURONTIN) 600 MG tablet TAKE 2 TABLETS TID. 05/20/19   Marcial Pacas, MD    Allergies    Clindamycin hcl  Review of Systems   Review of Systems  Constitutional: Negative for chills and fever.  HENT: Negative for ear pain and sore throat.   Eyes: Negative for pain and visual disturbance.  Respiratory: Negative for cough and shortness of breath.   Cardiovascular: Positive for chest pain. Negative for palpitations.  Gastrointestinal: Negative for abdominal pain and vomiting.  Genitourinary: Negative for dysuria and hematuria.  Musculoskeletal: Negative for arthralgias and back pain.  Skin: Negative for color change and rash.  Neurological: Negative for seizures and syncope.  All other systems reviewed and are negative.   Physical Exam Updated Vital Signs BP (!) 149/62   Pulse 87   Temp 100.1 F (37.8 C) (Oral)   Resp 16   Ht 5\' 6"  (1.676 m)   Wt 80.3 kg   SpO2 97%   BMI 28.57 kg/m   Physical Exam Vitals and nursing note reviewed.  Constitutional:      General: She is not in acute distress.    Appearance: She is well-developed.  HENT:     Head: Normocephalic and atraumatic.  Eyes:     Conjunctiva/sclera: Conjunctivae normal.  Cardiovascular:     Rate and Rhythm: Normal rate and regular rhythm.     Heart sounds: No murmur.   Pulmonary:     Effort: Pulmonary effort is normal. No respiratory distress.     Breath sounds: Normal breath sounds.  Chest:     Chest wall: No deformity or tenderness.  Abdominal:     Palpations: Abdomen is soft.     Tenderness: There is no abdominal tenderness.  Musculoskeletal:     Cervical back: Neck supple.  Skin:    General: Skin is warm and dry.  Neurological:     Mental Status: She is alert.     ED Results / Procedures / Treatments   Labs (all labs ordered are listed, but only abnormal results are displayed) Labs Reviewed  BASIC METABOLIC PANEL - Abnormal; Notable for the following components:      Result Value   Potassium 2.9 (*)    All other components within normal limits  D-DIMER, QUANTITATIVE (NOT AT [facility]) - Abnormal; Notable for the following components:   D-Dimer, Quant 2.23 (*)    All other components within normal limits  CBC  TROPONIN I (HIGH SENSITIVITY)    EKG EKG Interpretation  Date/Time:  Monday November 02 2019 15:45:19 EST Ventricular Rate:  93 PR Interval:    QRS Duration: 117 QT Interval:  340 QTC Calculation: 423 R Axis:   -45 Text Interpretation: Sinus rhythm LVH with IVCD, LAD and secondary repol abnrm Baseline wander in lead(s) II III aVR aVF since last tracing no significant change Confirmed by Daleen Bo [phone]) on 11/02/2019 3:51:57 PM   Radiology CT Angio Chest PE W and/or Wo Contrast  Result Date: 11/02/2019 CLINICAL DATA:  Chest pain, shortness of breath, elevated D-dimer. COVID positive 10/28/2019 EXAM: CT ANGIOGRAPHY CHEST WITH CONTRAST TECHNIQUE: Multidetector CT imaging of the chest was performed using the standard protocol during bolus administration of intravenous contrast. Multiplanar CT image reconstructions and MIPs were obtained to evaluate the vascular anatomy. CONTRAST:  61mL OMNIPAQUE IOHEXOL 350 MG/ML SOLN COMPARISON:  Radiograph earlier this day. FINDINGS: Cardiovascular: There are no filling defects within the  pulmonary arteries to suggest pulmonary embolus. Subsegmental branches are not well assessed due to contrast bolus timing and soft tissue attenuation from habitus. No aortic dissection or aneurysm. Borderline cardiomegaly with mild right heart dilatation. No pericardial effusion. Left chest port remains in place, tip in the lower SVC. Mediastinum/Nodes: No enlarged mediastinal or hilar  lymph nodes. No esophageal wall thickening. No visualized thyroid nodule. No axillary adenopathy. Lungs/Pleura: Patchy ground-glass opacities throughout both lungs. No pulmonary edema or pleural fluid. The trachea and mainstem bronchi are patent. Upper Abdomen: Simple cyst in the upper left kidney. No acute findings. Musculoskeletal: There are no acute or suspicious osseous abnormalities. Mild degenerative change in the midthoracic spine. Review of the MIP images confirms the above findings. IMPRESSION: 1. No pulmonary embolus. 2. Patchy ground-glass opacities throughout both lungs, pattern consistent with COVID-19 pneumonia. Parenchymal involvement is mild-to-moderate. Electronically Signed   By: Keith Rake M.D.   On: 11/02/2019 20:48   DG Chest Port 1 View  Result Date: 11/02/2019 CLINICAL DATA:  Cough. EXAM: PORTABLE CHEST 1 VIEW COMPARISON:  November 28, 2018. FINDINGS: Stable cardiomediastinal silhouette. No pneumothorax or pleural effusion is noted. Left internal jugular Port-A-Cath is unchanged in position. Both lungs are clear. The visualized skeletal structures are unremarkable. IMPRESSION: No active disease. Electronically Signed   By: Marijo Conception M.D.   On: 11/02/2019 16:52    Procedures Procedures (including critical care time)  Medications Ordered in ED Medications  sodium chloride flush (NS) 0.9 % injection 3 mL (3 mLs Intravenous Given 11/02/19 1818)  sodium chloride (PF) 0.9 % injection (  Given by Other 11/02/19 2107)  iohexol (OMNIPAQUE) 350 MG/ML injection 80 mL (80 mLs Intravenous Contrast Given  11/02/19 2022)  heparin lock flush 100 unit/mL (500 Units Intracatheter Given 11/02/19 2124)    ED Course  I have reviewed the triage vital signs and the nursing notes.  Pertinent labs & imaging results that were available during my care of the patient were reviewed by me and considered in my medical decision making (see chart for details).    MDM Rules/Calculators/A&P                      69 year old lady who presented to the emergency department with complaints of chest pain in setting of recent COVID-19 diagnosis.  Breast cancer patient.  On exam patient is known to be very well-appearing, normal oxygen on room air.  No respiratory difficulty.  CXR clear.  EKG without ischemic changes, troponin within normal limits.  Given chest pain and risk factors, check dimer, elevated.  CTA chest negative for acute PE.  Mild patchy groundglass opacities consistent with mild COVID-19 pneumonia.  At this time, believe patient is appropriate for discharge and outpatient management.  Recommend close follow-up with primary doctor and oncology.  Reviewed return precautions in detail, discharged home.    After the discussed management above, the patient was determined to be safe for discharge.  The patient was in agreement with this plan and all questions regarding their care were answered.  ED return precautions were discussed and the patient will return to the ED with any significant worsening of condition.   Final Clinical Impression(s) / ED Diagnoses Final diagnoses:  COVID-19  Chest pain, unspecified type    Rx / DC Orders ED Discharge Orders    None       Lucrezia Starch, MD 11/03/19 0128

## 2019-11-04 ENCOUNTER — Ambulatory Visit: Payer: Federal, State, Local not specified - PPO

## 2019-11-04 ENCOUNTER — Telehealth: Payer: Self-pay | Admitting: Hematology

## 2019-11-04 NOTE — Telephone Encounter (Signed)
Scheduled appt per 2/19 sch message- pt aware of 3/12 appt

## 2019-11-05 NOTE — Telephone Encounter (Signed)
Appointment has been made for Friday 2/26.

## 2019-11-05 NOTE — Telephone Encounter (Signed)
Called pt to see how she was feeling. Pt stated that she is feeling better. Informed pt that I did not receive the email she sent to me with the positive COVID result (performed by [facility]).   Pt went to the hospital and is still in need of a work note.   She has been out of work since 11/02/2019 and tested positive for COVID.

## 2019-11-06 ENCOUNTER — Encounter: Payer: Self-pay | Admitting: Internal Medicine

## 2019-11-06 ENCOUNTER — Ambulatory Visit (INDEPENDENT_AMBULATORY_CARE_PROVIDER_SITE_OTHER): Payer: Federal, State, Local not specified - PPO | Admitting: Internal Medicine

## 2019-11-06 DIAGNOSIS — U071 COVID-19: Secondary | ICD-10-CM

## 2019-11-06 NOTE — Progress Notes (Signed)
Virtual Visit via Video Note  I connected with Summer [name] on 11/06/19 at  9:20 AM EST by a video enabled telemedicine application and verified that I am speaking with the correct person using two identifiers.  The patient and the provider were at separate locations throughout the entire encounter.   I discussed the limitations of evaluation and management by telemedicine and the availability of in person appointments. The patient expressed understanding and agreed to proceed. The patient and the provider were the only parties present for the visit unless noted in HPI below.  History of Present Illness: The patient is a 69 y.o. female with visit for covid-19 tested positive on 10/28/19. Started with symptoms some before this. Feels she got this at work. Someone else at her work was coughing for some time. Has some cough worse at night time. Some coughing fits with talking a lot. Denies SOB or chest pains. Denies nausea/vomiting/diarrhea. Denies fevers or chills. Overall it is improving. Has tried otc medication for sleep melatonin which helped her sleep.  Observations/Objective: Appearance: normal, breathing appears normal, no coughing during visit, casual grooming, abdomen does not appear distended, throat normal, memory normal, mental status is A and O times 3  Assessment and Plan: See problem oriented charting  Follow Up Instructions: return to work note with March 8th as return date given symptoms and onset.   I discussed the assessment and treatment plan with the patient. The patient was provided an opportunity to ask questions and all were answered. The patient agreed with the plan and demonstrated an understanding of the instructions.   The patient was advised to call back or seek an in-person evaluation if the symptoms worsen or if the condition fails to improve as anticipated.  Hoyt Koch, MD

## 2019-11-06 NOTE — Telephone Encounter (Signed)
Dr. Carlean Purl, please be advised that I attempted to call the patient again to get her scheduled with either you or one of the APPs to evaluate her IDA. She declined saying she had COVID and would call back later to be scheduled.

## 2019-11-06 NOTE — Assessment & Plan Note (Signed)
Oncology will delay treatment until 3/12. Return to work date 11/16/19 and work note given today. If worsening call or return.

## 2019-11-10 ENCOUNTER — Encounter: Payer: Self-pay | Admitting: Internal Medicine

## 2019-11-10 ENCOUNTER — Ambulatory Visit: Payer: Federal, State, Local not specified - PPO | Admitting: Physician Assistant

## 2019-11-11 ENCOUNTER — Telehealth: Payer: Self-pay

## 2019-11-11 NOTE — Telephone Encounter (Deleted)
Error

## 2019-11-13 NOTE — Progress Notes (Signed)
[place]   [phone]) [phone] [phone]) [phone]   Clinic Follow up Note   Patient Care Team: Hoyt Koch, MD as PCP - General (Internal Medicine) Sueanne Margarita, MD as PCP - Cardiology (Cardiology) Mauro Kaufmann, RN as Oncology Nurse Navigator Rockwell Germany, RN as Oncology Nurse Navigator Excell Seltzer, MD (Inactive) as Consulting Physician (General Surgery) Truitt Merle, MD as Consulting Physician (Hematology) Gery Pray, MD as Consulting Physician (Radiation Oncology)  Date of Service:  11/20/2019  CHIEF COMPLAINT: F/u of right breast cancer  SUMMARY OF ONCOLOGIC HISTORY: Oncology History Overview Note  Cancer Staging Malignant neoplasm of upper-outer quadrant of right breast in female, estrogen receptor positive ([place]) Staging form: Breast, AJCC 8th Edition - Clinical stage from 11/10/2018: Stage IA (cT1b, cN0, cM0, G2, ER+, PR+, HER2+) - Signed by Truitt Merle, MD on 11/18/2018     Malignant neoplasm of upper-outer quadrant of right breast in female, estrogen receptor positive ([place])  11/07/2018 Mammogram   Diagnostic 11/07/18 IMPRESSION: 1. 1 x 0.7 x 0.6 cm hypoechoic mass with distortion at the 9-9:30 position of the RIGHT breast 6 cm from the nipple with distortion in the UPPER-OUTER RIGHT breast, corresponding to the mammographic abnormality. One RIGHT axillary lymph node with borderline cortical thickness. Tissue sampling of the RIGHT breast mass and borderline RIGHT axillary lymph node recommended. ADDENDUM: Patient returned today for biopsy of a single borderline thickened lymph node in the RIGHT axilla. I could not reproduce a morphologically abnormal lymph node today, possibly resolved in the interval. Today, only normal appearing lymph nodes were identified in the RIGHT axilla. As such, the axillary ultrasound-guided biopsy was canceled.   11/10/2018 Cancer Staging   Staging form: Breast, AJCC 8th Edition - Clinical stage  from 11/10/2018: Stage IA (cT1b, cN0, cM0, G2, ER+, PR+, HER2+) - Signed by Truitt Merle, MD on 11/18/2018   11/11/2018 Initial Biopsy   Diagnosis 11/11/18 Breast, right, needle core biopsy, upper outer - 9:30 o'clock position - INVASIVE DUCTAL CARCINOMA, GRADE II/III. - SEE MICROSCOPIC DESCRIPTION.   11/11/2018 Receptors her2   Results: IMMUNOHISTOCHEMICAL AND MORPHOMETRIC ANALYSIS PERFORMED MANUALLY The tumor cells are POSITIVE for Her2 (3+). Estrogen Receptor: 95%, POSITIVE, STRONG STAINING INTENSITY Progesterone Receptor: 5%, POSITIVE, STRONG STAINING INTENSITY Proliferation Marker Ki67: 20%   11/17/2018 Initial Diagnosis   Malignant neoplasm of upper-outer quadrant of right breast in female, estrogen receptor positive ([place])   11/28/2018 Surgery   RIGHT BREAST LUMPECTOMY WITH RADIOACTIVE SEED AND RIGHT AXILLARY SENTINEL LYMPH NODE BIOPSY by Dr. Excell Seltzer  11/28/18    11/28/2018 Pathology Results   Diagnosis 11/28/18  1. Breast, lumpectomy, Right w/seed - INVASIVE DUCTAL CARCINOMA, 1.6 CM, [place]TINGHAM GRADE 2 OF 3. - MARGINS OF RESECTION ARE NOT INVOLVED (CLOSEST MARGIN: LESS THAN 1 MM, ANTERIOR). - DUCTAL CARCINOMA IN SITU. - BIOPSY SITE CHANGES. - SEE ONCOLOGY TABLE. 2. Lymph node, sentinel, biopsy, right Axillary - ONE LYMPH NODE, NEGATIVE FOR CARCINOMA (0/1). 3. Lymph node, sentinel, biopsy, right Axillary - ONE LYMPH NODE, NEGATIVE FOR CARCINOMA (0/1).   11/28/2018 Cancer Staging   Staging form: Breast, AJCC 8th Edition - Pathologic stage from 11/28/2018: Stage IA (pT1c, pN0, cM0, G2, ER+, PR+, HER2+) - Signed by Truitt Merle, MD on 12/11/2018   12/22/2018 Procedure   Baseline ECHO 12/22/18  IMPRESSIONS  1. The left ventricle has normal systolic function with an ejection fraction of 60-65%. The cavity size was normal. There is mild concentric left ventricular hypertrophy. Left ventricular diastolic parameters were normal.  2. The right ventricle has normal systolic function. The cavity was  normal. There is no increase in right ventricular wall thickness.  3. Left atrial size was moderately dilated.  4. The aortic valve is tricuspid. Aortic valve regurgitation was not assessed by color flow Doppler.    01/02/2019 -  Chemotherapy   Adjuvant chemo weekly Taxol with Herceptin starting 01/02/19. Will d/c after 02/13/19 and will switch to Kadcyla q3weeks starting 02/20/19 due to worsening neuropathy. Due to worsening Muscle cramps Kadcyla stopped 04/03/19 and switched to Herceptin q3weeks on 04/23/19.    04/09/2019 - 05/28/2019 Radiation Therapy   RT with Dr. Sondra Come 04/09/19-05/28/19   06/29/2019 -  Anti-estrogen oral therapy   Exemestane 96m daily starting 06/29/19      CURRENT THERAPY:  -Adjuvant chemo weekly Taxol with Herceptin starting 01/02/19. Will d/c after 02/13/19 and will switch to Kadcyla q3weeks starting 6/12/20due to worsening neuropathy. Due to worsening Muscle cramps I switched Kadcyla to Herceptin on 04/23/19 -IV iron as needed -Exemestane 287mdaily starting 06/29/19  INTERVAL HISTORY:  CaMilani Lowensteins here for a follow up and treatment. She presents to the clinic alone. She notes she continues to have neuropathy mostly in her left toes and improvement in her hands, nearly resolved. She notes she does feel off balance lately.     REVIEW OF SYSTEMS:   Constitutional: Denies fevers, chills or abnormal weight loss Eyes: Denies blurriness of vision Ears, nose, mouth, throat, and face: Denies mucositis or sore throat Respiratory: Denies cough, dyspnea or wheezes Cardiovascular: Denies palpitation, chest discomfort or lower extremity swelling Gastrointestinal:  Denies nausea, heartburn or change in bowel habits Skin: Denies abnormal skin rashes Lymphatics: Denies new lymphadenopathy or easy bruising Neurological: (+) Stable neuropathy mainly in left toes, nearly resolved in hands  Behavioral/Psych: Mood is stable, no new changes  All other systems were  reviewed with the patient and are negative.  MEDICAL HISTORY:  Past Medical History:  Diagnosis Date  . Abdominal pain, left lower quadrant 11/09/2009   Qualifier: Diagnosis of  By: NeValma CavaPN, NaIzora Gala  . Allergic rhinitis 08/26/2008   Qualifier: Diagnosis of  By: ToSherren MochaD, JeJory Ee . Anemia, iron deficiency 11/21/2018  . Arthritis shoulders and back  . BACK PAIN, CHRONIC 06/13/2007   Qualifier: Diagnosis of  By: ToSherren MochaD, JeJory Ee . 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 Constipation 07/01/2018  . Costochondritis 05/04/2014  . Diverticulitis of colon 06/13/2007   S/p segmental colectomy for perforated diverticulitis   . Diverticulosis   . Facial pain 10/06/2015  . Fibromyalgia   . Foot sprain, left, initial encounter 08/31/2016  . Glossitis 02/02/2016  . Headache 04/24/2016  . HEMATURIA UNSPECIFIED 06/25/2008   Qualifier: Diagnosis of  By: ToSherren MochaD, JeJory Ee . HEMATURIA, HX OF 11/14/2009   Qualifier: Diagnosis of  By: VeRegino SchultzeMA (AAMA), RaApolonio Schneiders  . History of kidney stones   . History of kidney stones   . History of shingles 2012--  no residual pain  . Itching 10/13/2018  . Left eye pain 07/01/2018  . Left ureteral calculus   . LLQ pain 04/24/2016  . LOC OSTE[facility]ARTHROS [facility] SPEC PRIM[facility] 06/27/2009   Qualifier: Diagnosis of  By: ToSherren MochaD, JeJory Ee . Malignant neoplasm of upper-outer quadrant of right breast in female, estrogen receptor positive ([facility]3/05/2019  . MCI (mild cognitive  impairment)   . Memory loss 12/21/2016  . Morbid obesity ([place]) 11/09/2010  . Pain of right side of body 08/31/2016  . Pruritus 10/26/2016  . Right sided temporal headache 12/21/2016  . Tremor 12/21/2016  . Trigeminal neuralgia RIGHT  . Trigeminal neuralgia of right side of face 11/30/2010  . Trochanteric bursitis, right hip 10/26/2016  . Urgency of urination   . Weight loss 07/22/2017  . Zoster without complications 1/61/0960    SURGICAL  HISTORY: Past Surgical History:  Procedure Laterality Date  . BREAST LUMPECTOMY WITH RADIOACTIVE SEED AND SENTINEL LYMPH NODE BIOPSY Right 11/28/2018   Procedure: RIGHT BREAST LUMPECTOMY WITH RADIOACTIVE SEED AND RIGHT AXILLARY SENTINEL LYMPH NODE BIOPSY;  Surgeon: Excell Seltzer, MD;  Location: [place];  Service: General;  Laterality: Right;  . CEREBRAL MICROVASCULAR DECOMPRESSION  07-05-2006   RIGHT TRIGEMINAL NERVE  . CYSTOSCOPY/RETROGRADE/URETEROSCOPY/STONE EXTRACTION WITH BASKET  07/04/2012   Procedure: CYSTOSCOPY/RETROGRADE/URETEROSCOPY/STONE EXTRACTION WITH BASKET;  Surgeon: Claybon Jabs, MD;  Location[facility];  Service: Urology;  Laterality: Left;  . EXPLORATORY LAPAROTOMY/ RESECTION MID TO DISTAL SIGMOID AND PROXIMAL RECTUM/ END PROXIMAL SIGMOID COLOSTOMY  07-17-2006   PERFORATED DIVERTICULITIS WITH PERITONITIS  . gamma knife  05/17/2016   for trigeminal neuralgia, [facility], Dr Salomon Fick  . gamma knife  2019  . IR CV LINE INJECTION  10/27/2019  . PORTACATH PLACEMENT Left 11/28/2018   Procedure: INSERTION PORT-A-CATH WITH ULTRASOUND;  Surgeon: Excell Seltzer, MD;  Location: [place];  Service: General;  Laterality: Left;  . RESECTION COLOSTOMY/ CLOSURE COLOSTOMY WITH COLOPROCTOSTOMY  02-20-2007  . TONSILLECTOMY  AS CHILD  . URETEROSCOPY  07/04/2012   Procedure: URETEROSCOPY;  Surgeon: Claybon Jabs, MD;  Location: [facility];  Service: Urology;  Laterality: Left;  Marland Kitchen VAGINAL HYSTERECTOMY  1998   Partial  . WISDOM TOOTH EXTRACTION      I have reviewed the social history and family history with the patient and they are unchanged from previous note.  ALLERGIES:  is allergic to clindamycin hcl.  MEDICATIONS:  Current Outpatient Medications  Medication Sig Dispense Refill  . acetaminophen (TYLENOL) 500 MG tablet Take 1,000 mg by mouth every 6 (six) hours as needed.    . diclofenac Sodium (VOLTAREN) 1 % GEL Apply 2 g topically 4 (four) times  daily. 100 g 5  . DULoxetine (CYMBALTA) 60 MG capsule TAKE 1 CAPSULE BY MOUTH EVERY DAY 90 capsule 3  . exemestane (AROMASIN) 25 MG tablet TAKE 1 TABLET (25 MG TOTAL) BY MOUTH DAILY AFTER BREAKFAST. 90 tablet 1  . gabapentin (NEURONTIN) 600 MG tablet TAKE 2 TABLETS TID. 540 tablet 3   No current facility-administered medications for this visit.    PHYSICAL EXAMINATION: ECOG PERFORMANCE STATUS: 1 - Symptomatic but completely ambulatory  Vitals:   11/20/19 1009  BP: (!) 132/53  Pulse: 76  Resp: 17  Temp: 98 F (36.7 C)  SpO2: 100%   Filed Weights   11/20/19 1009  Weight: 185 lb (83.9 kg)    Due to COVID19 we will limit examination to appearance. Patient had no complaints.  GENERAL:alert, no distress and comfortable SKIN: skin color normal, no rashes or significant lesions EYES: normal, Conjunctiva are pink and non-injected, sclera clear  NEURO: alert & oriented x 3 with fluent speech   LABORATORY DATA:  I have reviewed the data as listed CBC Latest Ref Rng & Units 11/20/2019 11/02/2019 10/30/2019  WBC 4.0 - 10.5 K/uL 4.8 5.6 5.7  Hemoglobin 12.0 - 15.0 g/dL  12.7 12.1 12.6  Hematocrit 36.0 - 46.0 % 40.2 38.4 39.6  Platelets 150 - 400 K/uL 227 229 147(L)     CMP Latest Ref Rng & Units 11/20/2019 11/02/2019 10/30/2019  Glucose 70 - 99 mg/dL 126(H) 94 95  BUN 8 - 23 mg/dL 14 11 11   Creatinine 0.44 - 1.00 mg/dL 0.74 0.58 0.68  Sodium 135 - 145 mmol/L 139 143 142  Potassium 3.5 - 5.1 mmol/L 3.9 2.9(L) 3.4(L)  Chloride 98 - 111 mmol/L 105 103 104  CO2 22 - 32 mmol/L 27 30 30   Calcium 8.9 - 10.3 mg/dL 8.8(L) 9.1 8.4(L)  Total Protein 6.5 - 8.1 g/dL 6.7 - 6.7  Total Bilirubin 0.3 - 1.2 mg/dL 0.7 - 0.5  Alkaline Phos 38 - 126 U/L 108 - 88  AST 15 - 41 U/L 19 - 26  ALT 0 - 44 U/L 9 - 15      RADIOGRAPHIC STUDIES: I have personally reviewed the radiological images as listed and agreed with the findings in the report. No results found.   ASSESSMENT & PLAN:  Summer [name] is a 69 y.o. female with   1.Malignant neoplasm of upper-outer quadrant of right breast, StageIA,pT1b,N0,M0, ER/PR+, HER2+, GradeII -She was diagnosed in 11/2018. She is s/p right breastlumpectomyand SLNB  and adjuvant radiation.  -She startedadjuvantweekly Taxol with Herceptinon4/24/20butstopped after 6 weeks therapydue to worsening neuropathyand infusion reaction. She also tried Kadcyla but due to worsening muscle cramps she was changed to Herceptin infusion q3weeks starting 04/23/19. If she tolerates we will continueanti-her2 treatmentuntil 12/2019. -I started her on antiestrogen therapy with Exemestane in 06/2019. She has tolerated moderately due to known joint pain worsening in shoulders and knees. She is willing to continue for now.  -She is clinically stable overall with mild neuropathy in her left toes. She is recovering well from [place]. Labs reviewed and overall adequate to proceed with Herceptin today.  -She has 6 treatments remaining after today. Will repeat labs every 3 treatment given they are stable. .  -Continue Exemestane -Next Mammogram on 12/08/19 F/u in 9 and 18 weeks.    2. Significant Muscle Cramps, Diffuse body pain, joint pain/arthritis  possible related to chemo, improved lately  -She has been seen by her neurologist Dr Krista Blue who increased herGabapentinto 61m 7 tabs a day. She was to have nerve study in 07/2019 -She does have arthritis progression due to Exemestane with b/l knee and shoulder pain. I previously recommend she start OTC Glucosamine and increase exercise to help her pain.   3. Anemia,iron deficiency -Last colonoscopy was in 04/2012 which wasunremarkable except diverticulosis.  -She had partial colectomy in the past due to severe diverticulosis. -This is possiblyiron deficiency from diverticulosis.She denies any current bleeding.  -Her workup in early 2020 showed iron 17, ferritin 4, normal retic ct, folate Vitamin B12 and  SPEP panels. -she received iv iron and anemia has resolved   -Dr. GCarlean Purlis open to have Colonoscopy for further evaluation. I recommend she proceed.  -IV iron was given 3/18/20and 12/12/18. Has been resolved lately  4. Trigeminal neuralgia  -5/26/20brain and neck MRIwere stable. -She has been having moderate right side HA which I suspect is exacerbation of her neuralgia secondary to chemo. -Has not been completely controlled but managing with occasionally worsened flares.  -She has oxycodone which she can use for significant pain. Otherwise she is on Gabapentin 606m7 tabs a day.  5.PeripheralNeuropathy, G1-2 -Secondary to chemoTaxolstarted aftercycle 3. -Taxol switched toTDM-1 Kadcylaon 6/12/20which was  switched to Herceptin on 04/23/19 -On Robaxin, B12 and Gabapentin.  -Her neuropathyhas overall improved in her feet, but still significant 2 left toes. Her fingers are nearly resolved. She does note being off balanced lately. I encouraged her to be careful with walking to avoid fall.  -I recommend she f/u with her podiatrist and neurologist. I also suggest PT to help with her balance.   6. Osteopenia  -Her baseline DEXA from 07/2019 shows osteopenia with lowest T-score -1.1 at left hip.  -Will continue to monitor on AI as this can weaken her bones. Repeat every 2 years.  -I recommend she start OTC calcium and Vitamin D.   7. COVID19 (+)   -She has recently had cough with clear thick phlegm. She notes being significantly fatigued for the past 2 weeks. She denies fever.   -She also has been having mid chest palpitations. She has not been formally seen by Dr Radford Pax yet. -She tested positive for COVID19 on 10/29/19 through [facility]. She was quarantined at home and did not require treatment. She did have ED visit and workout indicated b/l Pneumonia.     PLAN: -Labs reviewed and adequate to proceed with Herceptin today  -Continue exemestane -Trastuzumab every 3 weeks  X6 -Lab and F/u in9 and 18 weeks  -Mammogram on 12/08/19 -ECHO in early May     No problem-specific Assessment & Plan notes found for this encounter.   Orders Placed This Encounter  Procedures  . ECHOCARDIOGRAM COMPLETE    Standing Status:   Future    Standing Expiration Date:   02/19/2021    Order Specific Question:   Where should this test be performed    Answer:   [facility]    Order Specific Question:   Perflutren DEFINITY (image enhancing agent) should be administered unless hypersensitivity or allergy exist    Answer:   Administer Perflutren    Order Specific Question:   Is a special reader required? (athlete or structural heart)    Answer:   Yes    Order Specific Question:   Reason for exam-Echo    Answer:   Chemo  V67.2 / Z09    Order Specific Question:   Release to patient    Answer:   Immediate    Order Specific Question:   Other Comments    Answer:   please let Dr. Gwenyth Ober read her chemo   All questions were answered. The patient knows to call the clinic with any problems, questions or concerns. No barriers to learning was detected. The total time spent in the appointment was 30 minutes.     Truitt Merle, MD 11/20/2019   I, Joslyn Devon, am acting as scribe for Truitt Merle, MD.   I have reviewed the above documentation for accuracy and completeness, and I agree with the above.

## 2019-11-18 ENCOUNTER — Other Ambulatory Visit: Payer: Self-pay | Admitting: Hematology

## 2019-11-20 ENCOUNTER — Encounter: Payer: Self-pay | Admitting: Hematology

## 2019-11-20 ENCOUNTER — Inpatient Hospital Stay (HOSPITAL_BASED_OUTPATIENT_CLINIC_OR_DEPARTMENT_OTHER): Payer: Federal, State, Local not specified - PPO | Admitting: Hematology

## 2019-11-20 ENCOUNTER — Inpatient Hospital Stay: Payer: Federal, State, Local not specified - PPO

## 2019-11-20 ENCOUNTER — Inpatient Hospital Stay: Payer: Federal, State, Local not specified - PPO | Attending: Hematology

## 2019-11-20 ENCOUNTER — Other Ambulatory Visit: Payer: Self-pay

## 2019-11-20 VITALS — BP 132/53 | HR 76 | Temp 98.0°F | Resp 17 | Ht 66.0 in | Wt 185.0 lb

## 2019-11-20 DIAGNOSIS — Z923 Personal history of irradiation: Secondary | ICD-10-CM | POA: Diagnosis not present

## 2019-11-20 DIAGNOSIS — C50411 Malignant neoplasm of upper-outer quadrant of right female breast: Secondary | ICD-10-CM | POA: Insufficient documentation

## 2019-11-20 DIAGNOSIS — Z170 Estrogen receptor positive status [ER+]: Secondary | ICD-10-CM | POA: Diagnosis not present

## 2019-11-20 DIAGNOSIS — Z79811 Long term (current) use of aromatase inhibitors: Secondary | ICD-10-CM | POA: Diagnosis not present

## 2019-11-20 DIAGNOSIS — Z5112 Encounter for antineoplastic immunotherapy: Secondary | ICD-10-CM | POA: Diagnosis not present

## 2019-11-20 DIAGNOSIS — Z791 Long term (current) use of non-steroidal anti-inflammatories (NSAID): Secondary | ICD-10-CM | POA: Insufficient documentation

## 2019-11-20 DIAGNOSIS — M797 Fibromyalgia: Secondary | ICD-10-CM | POA: Insufficient documentation

## 2019-11-20 DIAGNOSIS — D509 Iron deficiency anemia, unspecified: Secondary | ICD-10-CM | POA: Diagnosis not present

## 2019-11-20 DIAGNOSIS — Z79899 Other long term (current) drug therapy: Secondary | ICD-10-CM | POA: Insufficient documentation

## 2019-11-20 DIAGNOSIS — R252 Cramp and spasm: Secondary | ICD-10-CM | POA: Insufficient documentation

## 2019-11-20 DIAGNOSIS — M8580 Other specified disorders of bone density and structure, unspecified site: Secondary | ICD-10-CM | POA: Insufficient documentation

## 2019-11-20 DIAGNOSIS — M25519 Pain in unspecified shoulder: Secondary | ICD-10-CM | POA: Insufficient documentation

## 2019-11-20 DIAGNOSIS — G500 Trigeminal neuralgia: Secondary | ICD-10-CM | POA: Insufficient documentation

## 2019-11-20 DIAGNOSIS — Z95828 Presence of other vascular implants and grafts: Secondary | ICD-10-CM

## 2019-11-20 DIAGNOSIS — M170 Bilateral primary osteoarthritis of knee: Secondary | ICD-10-CM | POA: Insufficient documentation

## 2019-11-20 DIAGNOSIS — G629 Polyneuropathy, unspecified: Secondary | ICD-10-CM | POA: Insufficient documentation

## 2019-11-20 DIAGNOSIS — R5383 Other fatigue: Secondary | ICD-10-CM | POA: Insufficient documentation

## 2019-11-20 DIAGNOSIS — Z9221 Personal history of antineoplastic chemotherapy: Secondary | ICD-10-CM | POA: Diagnosis not present

## 2019-11-20 LAB — COMPREHENSIVE METABOLIC PANEL
ALT: 9 U/L (ref 0–44)
AST: 19 U/L (ref 15–41)
Albumin: 3.2 g/dL — ABNORMAL LOW (ref 3.5–5.0)
Alkaline Phosphatase: 108 U/L (ref 38–126)
Anion gap: 7 (ref 5–15)
BUN: 14 mg/dL (ref 8–23)
CO2: 27 mmol/L (ref 22–32)
Calcium: 8.8 mg/dL — ABNORMAL LOW (ref 8.9–10.3)
Chloride: 105 mmol/L (ref 98–111)
Creatinine, Ser: 0.74 mg/dL (ref 0.44–1.00)
GFR calc Af Amer: >60 mL/min (ref >60)
GFR calc non Af Amer: >60 mL/min (ref >60)
Glucose, Bld: 126 mg/dL — ABNORMAL HIGH (ref 70–99)
Potassium: 3.9 mmol/L (ref 3.5–5.1)
Sodium: 139 mmol/L (ref 135–145)
Total Bilirubin: 0.7 mg/dL (ref 0.3–1.2)
Total Protein: 6.7 g/dL (ref 6.5–8.1)

## 2019-11-20 LAB — CBC WITH DIFFERENTIAL/PLATELET
Abs Immature Granulocytes: 0.02 10*3/uL (ref 0.00–0.07)
Basophils Absolute: 0 10*3/uL (ref 0.0–0.1)
Basophils Relative: 1 %
Eosinophils Absolute: 0.1 10*3/uL (ref 0.0–0.5)
Eosinophils Relative: 1 %
HCT: 40.2 % (ref 36.0–46.0)
Hemoglobin: 12.7 g/dL (ref 12.0–15.0)
Immature Granulocytes: 0 %
Lymphocytes Relative: 28 %
Lymphs Abs: 1.3 10*3/uL (ref 0.7–4.0)
MCH: 29.2 pg (ref 26.0–34.0)
MCHC: 31.6 g/dL (ref 30.0–36.0)
MCV: 92.4 fL (ref 80.0–100.0)
Monocytes Absolute: 0.4 10*3/uL (ref 0.1–1.0)
Monocytes Relative: 8 %
Neutro Abs: 3 10*3/uL (ref 1.7–7.7)
Neutrophils Relative %: 62 %
Platelets: 227 10*3/uL (ref 150–400)
RBC: 4.35 MIL/uL (ref 3.87–5.11)
RDW: 13.6 % (ref 11.5–15.5)
WBC: 4.8 10*3/uL (ref 4.0–10.5)
nRBC: 0 % (ref 0.0–0.2)

## 2019-11-20 MED ORDER — DIPHENHYDRAMINE HCL 25 MG PO CAPS
ORAL_CAPSULE | ORAL | Status: AC
  Filled 2019-11-20: qty 2

## 2019-11-20 MED ORDER — SODIUM CHLORIDE 0.9 % IV SOLN
Freq: Once | INTRAVENOUS | Status: AC
  Filled 2019-11-20: qty 250

## 2019-11-20 MED ORDER — DIPHENHYDRAMINE HCL 25 MG PO CAPS
50.0000 mg | ORAL_CAPSULE | Freq: Once | ORAL | Status: AC
  Administered 2019-11-20: 50 mg via ORAL

## 2019-11-20 MED ORDER — TRASTUZUMAB-DKST CHEMO 150 MG IV SOLR
8.0000 mg/kg | Freq: Once | INTRAVENOUS | Status: AC
  Administered 2019-11-20: 672 mg via INTRAVENOUS
  Filled 2019-11-20: qty 32

## 2019-11-20 MED ORDER — ACETAMINOPHEN 325 MG PO TABS
ORAL_TABLET | ORAL | Status: AC
  Filled 2019-11-20: qty 2

## 2019-11-20 MED ORDER — SODIUM CHLORIDE 0.9% FLUSH
10.0000 mL | INTRAVENOUS | Status: DC | PRN
  Administered 2019-11-20: 10 mL
  Filled 2019-11-20: qty 10

## 2019-11-20 MED ORDER — ACETAMINOPHEN 325 MG PO TABS
650.0000 mg | ORAL_TABLET | Freq: Once | ORAL | Status: AC
  Administered 2019-11-20: 650 mg via ORAL

## 2019-11-20 MED ORDER — SODIUM CHLORIDE 0.9% FLUSH
10.0000 mL | Freq: Once | INTRAVENOUS | Status: AC
  Administered 2019-11-20: 10 mL
  Filled 2019-11-20: qty 10

## 2019-11-20 MED ORDER — HEPARIN SOD (PORK) LOCK FLUSH 100 UNIT/ML IV SOLN
500.0000 [IU] | Freq: Once | INTRAVENOUS | Status: AC | PRN
  Administered 2019-11-20: 500 [IU]
  Filled 2019-11-20: qty 5

## 2019-11-20 NOTE — Patient Instructions (Signed)
[facility] Discharge Instructions for Patients Receiving Chemotherapy  Today you received the following chemotherapy agents: Trastuzumab   To help prevent nausea and vomiting after your treatment, we encourage you to take your nausea medication  as prescribed.    If you develop nausea and vomiting that is not controlled by your nausea medication, call the clinic.   BELOW ARE SYMPTOMS THAT SHOULD BE REPORTED IMMEDIATELY:  *FEVER GREATER THAN 100.5 F  *CHILLS WITH OR WITHOUT FEVER  NAUSEA AND VOMITING THAT IS NOT CONTROLLED WITH YOUR NAUSEA MEDICATION  *UNUSUAL SHORTNESS OF BREATH  *UNUSUAL BRUISING OR BLEEDING  TENDERNESS IN MOUTH AND THROAT WITH OR WITHOUT PRESENCE OF ULCERS  *URINARY PROBLEMS  *BOWEL PROBLEMS  UNUSUAL RASH Items with * indicate a potential emergency and should be followed up as soon as possible.  Feel free to call the clinic should you have any questions or concerns. The clinic phone number is [phone].  Please show the CHEMO ALERT CARD at check-in to the Emergency Department and triage nurse.   

## 2019-11-23 ENCOUNTER — Telehealth: Payer: Self-pay | Admitting: Hematology

## 2019-11-23 NOTE — Telephone Encounter (Signed)
Scheduled appt per 3/12 los.  Spoke with pt and she is aware of the appt date and time.

## 2019-11-25 ENCOUNTER — Ambulatory Visit: Payer: Federal, State, Local not specified - PPO

## 2019-11-26 ENCOUNTER — Encounter: Payer: Self-pay | Admitting: *Deleted

## 2019-11-27 ENCOUNTER — Other Ambulatory Visit: Payer: Federal, State, Local not specified - PPO

## 2019-11-27 ENCOUNTER — Ambulatory Visit: Payer: Federal, State, Local not specified - PPO | Admitting: Hematology

## 2019-11-27 ENCOUNTER — Ambulatory Visit: Payer: Federal, State, Local not specified - PPO

## 2019-12-04 ENCOUNTER — Encounter (HOSPITAL_COMMUNITY): Payer: Self-pay

## 2019-12-04 ENCOUNTER — Emergency Department (HOSPITAL_BASED_OUTPATIENT_CLINIC_OR_DEPARTMENT_OTHER): Payer: Federal, State, Local not specified - PPO

## 2019-12-04 ENCOUNTER — Encounter: Payer: Self-pay | Admitting: Family

## 2019-12-04 ENCOUNTER — Emergency Department (HOSPITAL_COMMUNITY)
Admission: EM | Admit: 2019-12-04 | Discharge: 2019-12-04 | Disposition: A | Discharge status: Home / Self Care | Payer: Federal, State, Local not specified - PPO | Attending: Emergency Medicine | Admitting: Emergency Medicine

## 2019-12-04 ENCOUNTER — Encounter: Payer: Self-pay | Admitting: Internal Medicine

## 2019-12-04 ENCOUNTER — Ambulatory Visit: Payer: Federal, State, Local not specified - PPO | Admitting: Family

## 2019-12-04 ENCOUNTER — Other Ambulatory Visit: Payer: Self-pay

## 2019-12-04 VITALS — BP 130/64 | HR 82 | Temp 98.3°F | Ht 66.0 in

## 2019-12-04 DIAGNOSIS — Z853 Personal history of malignant neoplasm of breast: Secondary | ICD-10-CM | POA: Diagnosis not present

## 2019-12-04 DIAGNOSIS — R2242 Localized swelling, mass and lump, left lower limb: Secondary | ICD-10-CM | POA: Diagnosis present

## 2019-12-04 DIAGNOSIS — M25562 Pain in left knee: Secondary | ICD-10-CM | POA: Diagnosis not present

## 2019-12-04 DIAGNOSIS — M79605 Pain in left leg: Secondary | ICD-10-CM

## 2019-12-04 DIAGNOSIS — Z9221 Personal history of antineoplastic chemotherapy: Secondary | ICD-10-CM | POA: Insufficient documentation

## 2019-12-04 DIAGNOSIS — M79609 Pain in unspecified limb: Secondary | ICD-10-CM | POA: Diagnosis not present

## 2019-12-04 HISTORY — DX: Disorder of kidney and ureter, unspecified: N28.9

## 2019-12-04 MED ORDER — PREDNISONE 50 MG PO TABS: ORAL_TABLET | ORAL | 0 refills | Status: DC

## 2019-12-04 MED ORDER — PREDNISONE 20 MG PO TABS
60.0000 mg | ORAL_TABLET | Freq: Once | ORAL | Status: AC
  Administered 2019-12-04: 60 mg via ORAL
  Filled 2019-12-04: qty 3

## 2019-12-04 NOTE — ED Triage Notes (Signed)
Patient reports that she began having pain to her left knee and behind the knee yesterday. Patient states the pain and swelling radiates to the mid thigh and down to her toes, Patient is currently receiving chemo.

## 2019-12-04 NOTE — Progress Notes (Signed)
Summer [name] is a 69 y.o. female with the following history as recorded in EpicCare:  Patient Active Problem List   Diagnosis Date Noted  . COVID-19 11/06/2019  . Muscle cramps 05/12/2019  . Cramps, muscle, general 04/23/2019  . Tinnitus 04/16/2019  . Chest pain of uncertain etiology 123XX123  . Bilateral leg edema 02/10/2019  . Port-A-Cath in place 01/30/2019  . Anemia, iron deficiency 11/21/2018  . Malignant neoplasm of upper-outer quadrant of right breast in female, estrogen receptor positive ([place]) 11/17/2018  . Itching 10/13/2018  . Left eye pain 07/01/2018  . Constipation 07/01/2018  . Weight loss 07/22/2017  . Memory loss 12/21/2016  . Tremor 12/21/2016  . Right sided temporal headache 12/21/2016  . Trochanteric bursitis, right hip 10/26/2016  . Pruritus 10/26/2016  . Foot sprain, left, initial encounter 08/31/2016  . Pain of right side of body 08/31/2016  . Headache 04/24/2016  . LLQ pain 04/24/2016  . Glossitis 02/02/2016  . Routine general medical examination at a health care facility 10/27/2015  . Facial pain 10/06/2015  . Trigeminal neuralgia of right side of face 11/30/2010  . Morbid obesity ([place]) 11/09/2010  . Fibromyalgia 11/09/2010  . HEMATURIA, HX OF 11/14/2009  . LOC OSTEOARTHROS NOT SPEC PRIM/SEC OTH [facility] SITE 06/27/2009  . Allergic rhinitis 08/26/2008  . 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 acetaminophen (TYLENOL) 500 MG tablet Take 1,000 mg by mouth every 6 (six) hours as needed.    . diclofenac Sodium (VOLTAREN) 1 % GEL Apply 2 g topically 4 (four) times daily. 100 g 5  . exemestane (AROMASIN) 25 MG tablet TAKE 1 TABLET (25 MG TOTAL) BY MOUTH DAILY AFTER BREAKFAST. 90 tablet 1  . gabapentin (NEURONTIN) 600 MG tablet TAKE 2 TABLETS TID. 540 tablet 3  . DULoxetine (CYMBALTA) 60 MG capsule TAKE 1 CAPSULE BY MOUTH EVERY DAY (Patient taking differently: Take 60 mg  by mouth daily. ) 90 capsule 3   No current facility-administered medications for this visit.    Allergies: Clindamycin hcl  Past Medical History:  Diagnosis Date  . Abdominal pain, left lower quadrant 11/09/2009   Qualifier: Diagnosis of  By: Valma Cava LPN, Izora Gala    . Allergic rhinitis 08/26/2008   Qualifier: Diagnosis of  By: Sherren Mocha MD, Jory Ee   . Anemia, iron deficiency 11/21/2018  . Arthritis shoulders and back  . BACK PAIN, CHRONIC 06/13/2007   Qualifier: Diagnosis of  By: Sherren Mocha MD, Jory Ee   . 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 Constipation 07/01/2018  . Costochondritis 05/04/2014  . Diverticulitis of colon 06/13/2007   S/p segmental colectomy for perforated diverticulitis   . Diverticulosis   . Facial pain 10/06/2015  . Fibromyalgia   . Foot sprain, left, initial encounter 08/31/2016  . Glossitis 02/02/2016  . Headache 04/24/2016  . HEMATURIA UNSPECIFIED 06/25/2008   Qualifier: Diagnosis of  By: Sherren Mocha MD, Jory Ee   . HEMATURIA, HX OF 11/14/2009   Qualifier: Diagnosis of  By: Regino Schultze CMA (AAMA), Apolonio Schneiders    . History of kidney stones   . History of kidney stones   . History of shingles 2012--  no residual pain  . Itching 10/13/2018  . Left eye pain 07/01/2018  . Left ureteral calculus   . LLQ pain 04/24/2016  . LOC OSTEOARTHROS NOT SPEC PRIM/SEC OTH [facility] SITE 06/27/2009   Qualifier: Diagnosis of  By: Sherren Mocha MD, Jory Ee Malignant neoplasm of upper-outer quadrant of right breast in female, estrogen receptor positive ([place]) 11/17/2018  . MCI (mild cognitive impairment)   . Memory loss 12/21/2016  . Morbid obesity ([place]) 11/09/2010  . Pain of right side of body 08/31/2016  . Pruritus 10/26/2016  . Renal disorder   . Right sided temporal headache 12/21/2016  . Tremor 12/21/2016  . Trigeminal neuralgia RIGHT  . Trigeminal neuralgia of right side of face 11/30/2010  . Trochanteric bursitis, right hip 10/26/2016  . Urgency of urination    . Weight loss 07/22/2017  . Zoster without complications [id number]    Past Surgical History:  Procedure Laterality Date  . BREAST LUMPECTOMY WITH RADIOACTIVE SEED AND SENTINEL LYMPH NODE BIOPSY Right 11/28/2018   Procedure: RIGHT BREAST LUMPECTOMY WITH RADIOACTIVE SEED AND RIGHT AXILLARY SENTINEL LYMPH NODE BIOPSY;  Surgeon: Excell Seltzer, MD;  Location: [place];  Service: General;  Laterality: Right;  . CEREBRAL MICROVASCULAR DECOMPRESSION  07-05-2006   RIGHT TRIGEMINAL NERVE  . CYSTOSCOPY/RETROGRADE/URETEROSCOPY/STONE EXTRACTION WITH BASKET  07/04/2012   Procedure: CYSTOSCOPY/RETROGRADE/URETEROSCOPY/STONE EXTRACTION WITH BASKET;  Surgeon: Claybon Jabs, MD;  Location[facility];  Service: Urology;  Laterality: Left;  . EXPLORATORY LAPAROTOMY/ RESECTION MID TO DISTAL SIGMOID AND PROXIMAL RECTUM/ END PROXIMAL SIGMOID COLOSTOMY  07-17-2006   PERFORATED DIVERTICULITIS WITH PERITONITIS  . gamma knife  05/17/2016   for trigeminal neuralgia, [facility], Dr Salomon Fick  . gamma knife  2019  . IR CV LINE INJECTION  10/27/2019  . PORTACATH PLACEMENT Left 11/28/2018   Procedure: INSERTION PORT-A-CATH WITH ULTRASOUND;  Surgeon: Excell Seltzer, MD;  Location: [place];  Service: General;  Laterality: Left;  . RESECTION COLOSTOMY/ CLOSURE COLOSTOMY WITH COLOPROCTOSTOMY  02-20-2007  . TONSILLECTOMY  AS CHILD  . URETEROSCOPY  07/04/2012   Procedure: URETEROSCOPY;  Surgeon: Claybon Jabs, MD;  Location: [facility];  Service: Urology;  Laterality: Left;  Marland Kitchen VAGINAL HYSTERECTOMY  1998   Partial  . WISDOM TOOTH EXTRACTION      Family History  Problem Relation Age of Onset  . Heart disease Maternal Grandmother   . Diabetes Mother   . Breast cancer Maternal Aunt   . Lupus Cousin   . Colon cancer Neg Hx   . Esophageal cancer Neg Hx   . Rectal cancer Neg Hx   . Stomach cancer Neg Hx     Social History   Tobacco Use  . Smoking status: Never Smoker  . Smokeless  tobacco: Never Used  Substance Use Topics  . Alcohol use: Yes    Comment: rarely    Subjective:  4 day history of "extreme" left knee/ left lower leg pain; notes this pain is very different than her arthritis pain; contacted another provider earlier today who recommended that she be seen here for DVT evaluation; patient is currently being treated for breast cancer;  She denies any cough or chest pain;   Objective:  Vitals:   12/04/19 1553  BP: 130/64  Pulse: 82  Temp: 98.3 F (36.8 C)  TempSrc: Oral  SpO2: 97%  Height: 5\' 6"  (1.676 m)    General: Well developed, well nourished, in no acute distress  Skin : Warm and dry.  Head: Normocephalic and atraumatic  Lungs: Respirations unlabored; Musculoskeletal: No deformities; no active joint inflammation  Extremities: No edema, cyanosis, clubbing  Vessels: Symmetric bilaterally  Neurologic: Alert and oriented; speech intact; face symmetrical; moves all extremities well; CNII-XII intact without focal  deficit   Assessment:  1. Pain of left lower extremity     Plan:  Unfortunately patient is seen at 4:00 on Friday and cannot get any type of outpatient imaging done today; patient notes her pain is unbearable. She agrees to go to ER and opts to go to [facility] today; follow-up to be determined.  This visit occurred during the SARS-CoV-2 public health emergency.  Safety protocols were in place, including screening questions prior to the visit, additional usage of staff PPE, and extensive cleaning of exam room while observing appropriate contact time as indicated for disinfecting solutions.     No follow-ups on file.  No orders of the defined types were placed in this encounter.   Requested Prescriptions    No prescriptions requested or ordered in this encounter

## 2019-12-04 NOTE — ED Provider Notes (Signed)
[facility] DEPT Provider Note   CSN: [id number] Arrival date & time: 12/04/19  1618     History Chief Complaint  Patient presents with  . Leg Swelling  . chemo patient    Summer [name] is a 69 y.o. female.  69 year old female presents with 1 week of left leg pain.  Pain started in her left knee and then now is gone to her left thigh and left calf.  Denies any chest pain or shortness of breath.  No fever or chills.  No trauma.  Does have a history of arthritis.  Has been on prednisone in the past.  Symptoms worse with standing and better with remaining still.  Was seen by her doctor and sent here for evaluation of possible blood clots.        Past Medical History:  Diagnosis Date  . Abdominal pain, left lower quadrant 11/09/2009   Qualifier: Diagnosis of  By: Valma Cava LPN, Izora Gala    . Allergic rhinitis 08/26/2008   Qualifier: Diagnosis of  By: Sherren Mocha MD, Jory Ee   . Anemia, iron deficiency 11/21/2018  . Arthritis shoulders and back  . BACK PAIN, CHRONIC 06/13/2007   Qualifier: Diagnosis of  By: Sherren Mocha MD, Jory Ee   . 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 Constipation 07/01/2018  . Costochondritis 05/04/2014  . Diverticulitis of colon 06/13/2007   S/p segmental colectomy for perforated diverticulitis   . Diverticulosis   . Facial pain 10/06/2015  . Fibromyalgia   . Foot sprain, left, initial encounter 08/31/2016  . Glossitis 02/02/2016  . Headache 04/24/2016  . HEMATURIA UNSPECIFIED 06/25/2008   Qualifier: Diagnosis of  By: Sherren Mocha MD, Jory Ee   . HEMATURIA, HX OF 11/14/2009   Qualifier: Diagnosis of  By: Regino Schultze CMA (AAMA), Apolonio Schneiders    . History of kidney stones   . History of kidney stones   . History of shingles 2012--  no residual pain  . Itching 10/13/2018  . Left eye pain 07/01/2018  . Left ureteral calculus   . LLQ pain 04/24/2016  . LOC OSTEOARTHROS NOT SPEC PRIM/SEC OTH [facility] SITE  06/27/2009   Qualifier: Diagnosis of  By: Sherren Mocha MD, Jory Ee   . Malignant neoplasm of upper-outer quadrant of right breast in female, estrogen receptor positive ([place]) 11/17/2018  . MCI (mild cognitive impairment)   . Memory loss 12/21/2016  . Morbid obesity ([place]) 11/09/2010  . Pain of right side of body 08/31/2016  . Pruritus 10/26/2016  . Renal disorder   . Right sided temporal headache 12/21/2016  . Tremor 12/21/2016  . Trigeminal neuralgia RIGHT  . Trigeminal neuralgia of right side of face 11/30/2010  . Trochanteric bursitis, right hip 10/26/2016  . Urgency of urination   . Weight loss 07/22/2017  . Zoster without complications [id number]    Patient Active Problem List   Diagnosis Date Noted  . COVID-19 11/06/2019  . Muscle cramps 05/12/2019  . Cramps, muscle, general 04/23/2019  . Tinnitus 04/16/2019  . Chest pain of uncertain etiology 123XX123  . Bilateral leg edema 02/10/2019  . Port-A-Cath in place 01/30/2019  . Anemia, iron deficiency 11/21/2018  . Malignant neoplasm of upper-outer quadrant of right breast in female, estrogen receptor positive ([place]) 11/17/2018  . Itching 10/13/2018  . Left eye pain 07/01/2018  . Constipation 07/01/2018  . Weight loss 07/22/2017  . Memory loss 12/21/2016  . Tremor 12/21/2016  . Right sided temporal headache  12/21/2016  . Trochanteric bursitis, right hip 10/26/2016  . Pruritus 10/26/2016  . Foot sprain, left, initial encounter 08/31/2016  . Pain of right side of body 08/31/2016  . Headache 04/24/2016  . LLQ pain 04/24/2016  . Glossitis 02/02/2016  . Routine general medical examination at a health care facility 10/27/2015  . Facial pain 10/06/2015  . Trigeminal neuralgia of right side of face 11/30/2010  . Morbid obesity ([place]) 11/09/2010  . Fibromyalgia 11/09/2010  . HEMATURIA, HX OF 11/14/2009  . LOC OSTEOARTHROS NOT SPEC PRIM/SEC OTH [facility] SITE 06/27/2009  . Allergic rhinitis 08/26/2008  . VULVA INTRAEPITHELIAL NEOPLASIA, VIN I  06/18/2008    Past Surgical History:  Procedure Laterality Date  . BREAST LUMPECTOMY WITH RADIOACTIVE SEED AND SENTINEL LYMPH NODE BIOPSY Right 11/28/2018   Procedure: RIGHT BREAST LUMPECTOMY WITH RADIOACTIVE SEED AND RIGHT AXILLARY SENTINEL LYMPH NODE BIOPSY;  Surgeon: Excell Seltzer, MD;  Location: [facility];  Service: General;  Laterality: Right;  . CEREBRAL MICROVASCULAR DECOMPRESSION  07-05-2006   RIGHT TRIGEMINAL NERVE  . CYSTOSCOPY/RETROGRADE/URETEROSCOPY/STONE EXTRACTION WITH BASKET  07/04/2012   Procedure: CYSTOSCOPY/RETROGRADE/URETEROSCOPY/STONE EXTRACTION WITH BASKET;  Surgeon: Claybon Jabs, MD;  Location: [facility];  Service: Urology;  Laterality: Left;  . EXPLORATORY LAPAROTOMY/ RESECTION MID TO DISTAL SIGMOID AND PROXIMAL RECTUM/ END PROXIMAL SIGMOID COLOSTOMY  07-17-2006   PERFORATED DIVERTICULITIS WITH PERITONITIS  . gamma knife  05/17/2016   for trigeminal neuralgia, [facility], Dr Salomon Fick  . gamma knife  2019  . IR CV LINE INJECTION  10/27/2019  . PORTACATH PLACEMENT Left 11/28/2018   Procedure: INSERTION PORT-A-CATH WITH ULTRASOUND;  Surgeon: Excell Seltzer, MD;  Location: [facility];  Service: General;  Laterality: Left;  . RESECTION COLOSTOMY/ CLOSURE COLOSTOMY WITH COLOPROCTOSTOMY  02-20-2007  . TONSILLECTOMY  AS CHILD  . URETEROSCOPY  07/04/2012   Procedure: URETEROSCOPY;  Surgeon: Claybon Jabs, MD;  Location: [facility];  Service: Urology;  Laterality: Left;  Marland Kitchen VAGINAL HYSTERECTOMY  1998   Partial  . WISDOM TOOTH EXTRACTION       OB History   No obstetric history on file.     Family History  Problem Relation Age of Onset  . Heart disease Maternal Grandmother   . Diabetes Mother   . Breast cancer Maternal Aunt   . Lupus Cousin   . Colon cancer Neg Hx   . Esophageal cancer Neg Hx   . Rectal cancer Neg Hx   . Stomach cancer Neg Hx     Social History   Tobacco Use  . Smoking status: Never Smoker  . Smokeless tobacco:  Never Used  Substance Use Topics  . Alcohol use: Yes    Comment: rarely  . Drug use: No    Home Medications Prior to Admission medications   Medication Sig Start Date End Date Taking? Authorizing Provider  acetaminophen (TYLENOL) 500 MG tablet Take 1,000 mg by mouth every 6 (six) hours as needed.    [provider]  diclofenac Sodium (VOLTAREN) 1 % GEL Apply 2 g topically 4 (four) times daily. 08/03/19   Sater, Nanine Means, MD  DULoxetine (CYMBALTA) 60 MG capsule TAKE 1 CAPSULE BY MOUTH EVERY DAY Patient not taking: Reported on 12/04/2019 08/27/19   Marcial Pacas, MD  exemestane (AROMASIN) 25 MG tablet TAKE 1 TABLET (25 MG TOTAL) BY MOUTH DAILY AFTER BREAKFAST. 09/21/19   Truitt Merle, MD  gabapentin (NEURONTIN) 600 MG tablet TAKE 2 TABLETS TID. 05/20/19   Marcial Pacas, MD  Allergies    Clindamycin hcl  Review of Systems   Review of Systems  All other systems reviewed and are negative.   Physical Exam Updated Vital Signs BP (!) 145/73 (BP Location: Left Arm)   Pulse 78   Temp 98.8 F (37.1 C) (Oral)   Resp 15   SpO2 99%   Physical Exam Vitals and nursing note reviewed.  Constitutional:      General: She is not in acute distress.    Appearance: Normal appearance. She is well-developed. She is not toxic-appearing.  HENT:     Head: Normocephalic and atraumatic.  Eyes:     General: Lids are normal.     Conjunctiva/sclera: Conjunctivae normal.     Pupils: Pupils are equal, round, and reactive to light.  Neck:     Thyroid: No thyroid mass.     Trachea: No tracheal deviation.  Cardiovascular:     Rate and Rhythm: Normal rate and regular rhythm.     Heart sounds: Normal heart sounds. No murmur. No gallop.   Pulmonary:     Effort: Pulmonary effort is normal. No respiratory distress.     Breath sounds: Normal breath sounds. No stridor. No decreased breath sounds, wheezing, rhonchi or rales.  Abdominal:     General: Bowel sounds are normal. There is no distension.      Palpations: Abdomen is soft.     Tenderness: There is no abdominal tenderness. There is no rebound.  Musculoskeletal:        General: No tenderness. Normal range of motion.     Cervical back: Normal range of motion and neck supple.     Comments: Dorsalis pedis pulse 2+ on the left.  Neurovascular intact at the left foot.  Slight tenderness in the left popliteal fossa.  Questionable effusion to the right knee.  No warmness or tenderness to the knee.  Skin:    General: Skin is warm and dry.     Findings: No abrasion or rash.  Neurological:     Mental Status: She is alert and oriented to person, place, and time.     GCS: GCS eye subscore is 4. GCS verbal subscore is 5. GCS motor subscore is 6.     Cranial Nerves: No cranial nerve deficit.     Sensory: No sensory deficit.  Psychiatric:        Speech: Speech normal.        Behavior: Behavior normal.     ED Results / Procedures / Treatments   Labs (all labs ordered are listed, but only abnormal results are displayed) Labs Reviewed - No data to display  EKG None  Radiology No results found.  Procedures Procedures (including critical care time)  Medications Ordered in ED Medications - No data to display  ED Course  I have reviewed the triage vital signs and the nursing notes.  Pertinent labs & imaging results that were available during my care of the patient were reviewed by me and considered in my medical decision making (see chart for details).    MDM Rules/Calculators/A&P                      Lower extremity Doppler negative for blood clots.  Patient's left knee is with questionable effusion.  Suspect patient having flare of her arthritis.  Will give her prednisone here as well as prescribed for short taper.  No concern for septic joint. Final Clinical Impression(s) / ED Diagnoses Final diagnoses:  None  Rx / DC Orders ED Discharge Orders    None       Lacretia Leigh, MD 12/04/19 1911

## 2019-12-04 NOTE — Telephone Encounter (Signed)
I called the patient who reported an acute onset of severe pain and tenderness in her left lower leg, behind her knee down to her calf. This is different than the pain she has been treated for previously. Our clinic is closed but I called the patient and instructed her to go see her PCP today for an evaluation for rule out any serious conditions, such as a blood clot. She verbalized understanding and plans to be seen today. If her PCP feels she needs to see neurology then she will call us back to set up that appointment.

## 2019-12-04 NOTE — ED Notes (Signed)
PATIENT COMPLAINS OF KNEE PAIN TO THE FRONT AND BACK OF KNEE, KNEE IS SWOLLEN NO DEFORMITY UNABLE PALPATE POPTEAL NOR DORSAL PULSE. COULD NOT LOCATE DORSAL PUSLE WITH DOPLAR

## 2019-12-04 NOTE — Progress Notes (Signed)
Left lower extremity venous duplex exam completed.  Preliminary results can be found under CV proc under chart review.  12/04/2019 6:24 PM  Leahanna Buser, K., RDMS, RVT

## 2019-12-04 NOTE — Discharge Instructions (Addendum)
Use your home oxycodone as directed.  Follow-up with your doctor next week

## 2019-12-07 NOTE — Progress Notes (Signed)
Pharmacist Chemotherapy Monitoring - Follow Up Assessment    I verify that I have reviewed each item in the below checklist:  . Regimen for the patient is scheduled for the appropriate day and plan matches scheduled date. Marland Kitchen Appropriate non-routine labs are ordered dependent on drug ordered. . If applicable, additional medications reviewed and ordered per protocol based on lifetime cumulative doses and/or treatment regimen.   Plan for follow-up and/or issues identified: No . I-vent associated with next due treatment: No . MD and/or nursing notified: No  Summer [name] 12/07/2019 1:38 PM

## 2019-12-08 ENCOUNTER — Ambulatory Visit
Admission: RE | Admit: 2019-12-08 | Discharge: 2019-12-08 | Disposition: A | Discharge status: Home / Self Care | Payer: Federal, State, Local not specified - PPO | Source: Ambulatory Visit | Attending: Hematology | Admitting: Hematology

## 2019-12-08 ENCOUNTER — Encounter: Payer: Self-pay | Admitting: Rheumatology

## 2019-12-08 ENCOUNTER — Other Ambulatory Visit: Payer: Self-pay

## 2019-12-08 DIAGNOSIS — G8929 Other chronic pain: Secondary | ICD-10-CM

## 2019-12-08 DIAGNOSIS — Z170 Estrogen receptor positive status [ER+]: Secondary | ICD-10-CM

## 2019-12-08 DIAGNOSIS — M25562 Pain in left knee: Secondary | ICD-10-CM

## 2019-12-08 DIAGNOSIS — C50411 Malignant neoplasm of upper-outer quadrant of right female breast: Secondary | ICD-10-CM

## 2019-12-08 HISTORY — DX: Personal history of irradiation: Z92.3

## 2019-12-08 HISTORY — DX: Personal history of antineoplastic chemotherapy: Z92.21

## 2019-12-08 NOTE — Telephone Encounter (Signed)
I returned patient's call and had a discussion with her.  She did not have a good response to the cortisone injection or the prednisone taper.  She continues to have pain and discomfort in her knee joint.  She was in agreement to see an orthopedic surgeon.  Please refer her to orthopedics at her earliest possible.

## 2019-12-08 NOTE — Telephone Encounter (Signed)
Referral placed to Orthopedics

## 2019-12-11 ENCOUNTER — Inpatient Hospital Stay: Payer: Federal, State, Local not specified - PPO | Attending: Hematology

## 2019-12-11 ENCOUNTER — Other Ambulatory Visit: Payer: Self-pay

## 2019-12-11 VITALS — BP 143/74 | HR 74 | Temp 98.0°F | Resp 18 | Wt 191.5 lb

## 2019-12-11 DIAGNOSIS — Z5112 Encounter for antineoplastic immunotherapy: Secondary | ICD-10-CM | POA: Insufficient documentation

## 2019-12-11 DIAGNOSIS — Z79811 Long term (current) use of aromatase inhibitors: Secondary | ICD-10-CM | POA: Diagnosis not present

## 2019-12-11 DIAGNOSIS — Z170 Estrogen receptor positive status [ER+]: Secondary | ICD-10-CM | POA: Diagnosis not present

## 2019-12-11 DIAGNOSIS — C50411 Malignant neoplasm of upper-outer quadrant of right female breast: Secondary | ICD-10-CM | POA: Insufficient documentation

## 2019-12-11 MED ORDER — DIPHENHYDRAMINE HCL 25 MG PO CAPS
ORAL_CAPSULE | ORAL | Status: AC
  Filled 2019-12-11: qty 2

## 2019-12-11 MED ORDER — DIPHENHYDRAMINE HCL 25 MG PO CAPS
50.0000 mg | ORAL_CAPSULE | Freq: Once | ORAL | Status: AC
  Administered 2019-12-11: 50 mg via ORAL

## 2019-12-11 MED ORDER — HEPARIN SOD (PORK) LOCK FLUSH 100 UNIT/ML IV SOLN
500.0000 [IU] | Freq: Once | INTRAVENOUS | Status: AC | PRN
  Administered 2019-12-11: 500 [IU]
  Filled 2019-12-11: qty 5

## 2019-12-11 MED ORDER — SODIUM CHLORIDE 0.9 % IV SOLN
Freq: Once | INTRAVENOUS | Status: AC
  Filled 2019-12-11: qty 250

## 2019-12-11 MED ORDER — ACETAMINOPHEN 325 MG PO TABS
ORAL_TABLET | ORAL | Status: AC
  Filled 2019-12-11: qty 2

## 2019-12-11 MED ORDER — SODIUM CHLORIDE 0.9% FLUSH
10.0000 mL | INTRAVENOUS | Status: DC | PRN
  Administered 2019-12-11: 10 mL
  Filled 2019-12-11: qty 10

## 2019-12-11 MED ORDER — TRASTUZUMAB-DKST CHEMO 150 MG IV SOLR
6.0000 mg/kg | Freq: Once | INTRAVENOUS | Status: AC
  Administered 2019-12-11: 504 mg via INTRAVENOUS
  Filled 2019-12-11: qty 24

## 2019-12-11 MED ORDER — ACETAMINOPHEN 325 MG PO TABS
650.0000 mg | ORAL_TABLET | Freq: Once | ORAL | Status: AC
  Administered 2019-12-11: 650 mg via ORAL

## 2019-12-11 NOTE — Patient Instructions (Signed)
[facility] Discharge Instructions for Patients Receiving Chemotherapy  Today you received the following chemotherapy agents: Trastuzumab   To help prevent nausea and vomiting after your treatment, we encourage you to take your nausea medication  as prescribed.    If you develop nausea and vomiting that is not controlled by your nausea medication, call the clinic.   BELOW ARE SYMPTOMS THAT SHOULD BE REPORTED IMMEDIATELY:  *FEVER GREATER THAN 100.5 F  *CHILLS WITH OR WITHOUT FEVER  NAUSEA AND VOMITING THAT IS NOT CONTROLLED WITH YOUR NAUSEA MEDICATION  *UNUSUAL SHORTNESS OF BREATH  *UNUSUAL BRUISING OR BLEEDING  TENDERNESS IN MOUTH AND THROAT WITH OR WITHOUT PRESENCE OF ULCERS  *URINARY PROBLEMS  *BOWEL PROBLEMS  UNUSUAL RASH Items with * indicate a potential emergency and should be followed up as soon as possible.  Feel free to call the clinic should you have any questions or concerns. The clinic phone number is [phone].  Please show the CHEMO ALERT CARD at check-in to the Emergency Department and triage nurse.   

## 2019-12-11 NOTE — Progress Notes (Signed)
Per Dr. Burr Medico, okay for patient to receive treatment today without labs.

## 2019-12-15 ENCOUNTER — Ambulatory Visit: Payer: Federal, State, Local not specified - PPO | Admitting: Family Medicine

## 2019-12-15 ENCOUNTER — Encounter: Payer: Self-pay | Admitting: Family Medicine

## 2019-12-15 ENCOUNTER — Other Ambulatory Visit: Payer: Self-pay

## 2019-12-15 ENCOUNTER — Ambulatory Visit: Payer: Self-pay

## 2019-12-15 DIAGNOSIS — M79605 Pain in left leg: Secondary | ICD-10-CM | POA: Diagnosis not present

## 2019-12-15 MED ORDER — TIZANIDINE HCL 2 MG PO TABS: 2.0000 mg | ORAL_TABLET | Freq: Four times a day (QID) | ORAL | 1 refills | Status: DC | PRN

## 2019-12-15 NOTE — Progress Notes (Signed)
Office Visit Note   Patient: Summer [name]           Date of Birth: September 28, 1950           MRN: [id number] Visit Date: 12/15/2019 Requested by: Bo Merino, MD [address] [place],  [place] [address] PCP: Hoyt Koch, MD  Subjective: Chief Complaint  Patient presents with  . Left Knee - Pain    Pain all around the knee and up to the hip at times. The pain also goes down to the foot sometimes, with swelling in the lower leg. Numbness in the foot sometimes. S/p gel injection 10/08/19 - no help. Neg for DVT, 12/04/19. Currently going through chemo.    HPI: She is here with left leg pain.  Symptoms started a few months ago.  She has a history of osteoarthritis in her knee and had gel injections in January.  Unfortunately the injections have not helped.  Her pain started progressing down the back of the leg toward the foot along with some swelling, she was concerned about possible blood clot so she went to the ER where work-up for DVT was negative.  Now the pain also extends proximally to the posterior hip.  Denies any low back pain.  She has not noticed any weakness in her leg.  She tried prednisone with no improvement.  Pain wakes her from sleep at night.  Feels best when sitting on her right buttocks, leaning toward the right.  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  In 2004 she apparently had epidural steroid injections.  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               ROS: No rash, no fevers or chills.  All other systems were reviewed and are negative.  Objective: Vital Signs: There were no vitals taken for this visit.  Physical Exam:  General:  Alert and oriented, in no acute distress. Pulm:  Breathing unlabored. Psy:  Normal mood, congruent affect.  Left leg: She has no  significant spine tenderness.  She is tender in the left sciatic notch.  No pain with internal/external hip rotation and her range of motion is good.  Straight leg raise is negative, lower extremity strength and reflexes are normal.  Her left knee has 1+ effusion with no warmth.  She is tender over the lateral joint line.  Imaging: X-rays lumbar spine: She has scoliosis with diffuse degenerative changes and facet arthropathy.    Assessment & Plan: 1.  Persistent left leg pain, probably due to foraminal stenosis. -We will try physical therapy.  Zanaflex as needed. -If pain persist she will contact me and I will refer her to Dr. Ernestina Patches for epidural steroid injection.     Procedures: No procedures performed  No notes on file     PMFS History: Patient Active Problem List   Diagnosis Date Noted  . COVID-19 11/06/2019  . Muscle cramps 05/12/2019  . Cramps, muscle, general 04/23/2019  . Tinnitus 04/16/2019  . Chest pain of uncertain etiology [id number]  . Bilateral leg edema 02/10/2019  . Port-A-Cath in place 01/30/2019  . Anemia, iron deficiency 11/21/2018  . Malignant neoplasm of upper-outer quadrant of right breast in female, estrogen receptor positive ([place]) 11/17/2018  . Itching 10/13/2018  . Left  eye pain 07/01/2018  . Constipation 07/01/2018  . Weight loss 07/22/2017  . Memory loss 12/21/2016  . Tremor 12/21/2016  . Right sided temporal headache 12/21/2016  . Trochanteric bursitis, right hip 10/26/2016  . Pruritus 10/26/2016  . Foot sprain, left, initial encounter 08/31/2016  . Pain of right side of body 08/31/2016  . Headache 04/24/2016  . LLQ pain 04/24/2016  . Glossitis 02/02/2016  . Routine general medical examination at a health care facility 10/27/2015  . Facial pain 10/06/2015  . Trigeminal neuralgia of right side of face 11/30/2010  . Morbid obesity ([place]) 11/09/2010  . Fibromyalgia 11/09/2010  . HEMATURIA, HX OF 11/14/2009  . LOC OSTEOARTHROS NOT SPEC  PRIM/SEC OTH [facility] SITE 06/27/2009  . Allergic rhinitis 08/26/2008  . 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 Abdominal pain, left lower quadrant 11/09/2009   Qualifier: Diagnosis of  By: Valma Cava LPN, Izora Gala    . Allergic rhinitis 08/26/2008   Qualifier: Diagnosis of  By: Sherren Mocha MD, Jory Ee   . Anemia, iron deficiency 11/21/2018  . Arthritis shoulders and back  . BACK PAIN, CHRONIC 06/13/2007   Qualifier: Diagnosis of  By: Sherren Mocha MD, Jory Ee   . 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 Constipation 07/01/2018  . Costochondritis 05/04/2014  . Diverticulitis of colon 06/13/2007   S/p segmental colectomy for perforated diverticulitis   . Diverticulosis   . Facial pain 10/06/2015  . Fibromyalgia   . Foot sprain, left, initial encounter 08/31/2016  . Glossitis 02/02/2016  . Headache 04/24/2016  . HEMATURIA UNSPECIFIED 06/25/2008   Qualifier: Diagnosis of  By: Sherren Mocha MD, Jory Ee   . HEMATURIA, HX OF 11/14/2009   Qualifier: Diagnosis of  By: Regino Schultze CMA (AAMA), Apolonio Schneiders    . History of kidney stones   . History of kidney stones   . History of shingles 2012--  no residual pain  . Itching 10/13/2018  . Left eye pain 07/01/2018  . Left ureteral calculus   . LLQ pain 04/24/2016  . LOC OSTEOARTHROS NOT SPEC PRIM/SEC OTH [facility] SITE 06/27/2009   Qualifier: Diagnosis of  By: Sherren Mocha MD, Jory Ee   . Malignant neoplasm of upper-outer quadrant of right breast in female, estrogen receptor positive ([place]) 11/17/2018  . MCI (mild cognitive impairment)   . Memory loss 12/21/2016  . Morbid obesity ([place]) 11/09/2010  . Pain of right side of body 08/31/2016  . Personal history of chemotherapy   . Personal history of radiation therapy   . Pruritus 10/26/2016  . Renal disorder   . Right sided temporal headache 12/21/2016  . Tremor 12/21/2016  . Trigeminal neuralgia RIGHT  . Trigeminal neuralgia of right side of face  11/30/2010  . Trochanteric bursitis, right hip 10/26/2016  . Urgency of urination   . Weight loss 07/22/2017  . Zoster without complications [id number]    Family History  Problem Relation Age of Onset  . Heart disease Maternal Grandmother   . Diabetes Mother   . Breast cancer Maternal Aunt   . Lupus Cousin   . Colon cancer Neg Hx   . Esophageal cancer Neg Hx   . Rectal cancer Neg Hx   . Stomach cancer Neg Hx     Past Surgical History:  Procedure Laterality Date  . BREAST BIOPSY Right 11/11/2018  . BREAST LUMPECTOMY Right 11/28/2018  . BREAST LUMPECTOMY WITH RADIOACTIVE SEED AND SENTINEL LYMPH NODE  BIOPSY Right 11/28/2018   Procedure: RIGHT BREAST LUMPECTOMY WITH RADIOACTIVE SEED AND RIGHT AXILLARY SENTINEL LYMPH NODE BIOPSY;  Surgeon: Excell Seltzer, MD;  Location: [facility];  Service: General;  Laterality: Right;  . CEREBRAL MICROVASCULAR DECOMPRESSION  07-05-2006   RIGHT TRIGEMINAL NERVE  . CYSTOSCOPY/RETROGRADE/URETEROSCOPY/STONE EXTRACTION WITH BASKET  07/04/2012   Procedure: CYSTOSCOPY/RETROGRADE/URETEROSCOPY/STONE EXTRACTION WITH BASKET;  Surgeon: Claybon Jabs, MD;  Location: [facility];  Service: Urology;  Laterality: Left;  . EXPLORATORY LAPAROTOMY/ RESECTION MID TO DISTAL SIGMOID AND PROXIMAL RECTUM/ END PROXIMAL SIGMOID COLOSTOMY  07-17-2006   PERFORATED DIVERTICULITIS WITH PERITONITIS  . gamma knife  05/17/2016   for trigeminal neuralgia, [facility], Dr Salomon Fick  . gamma knife  2019  . IR CV LINE INJECTION  10/27/2019  . PORTACATH PLACEMENT Left 11/28/2018   Procedure: INSERTION PORT-A-CATH WITH ULTRASOUND;  Surgeon: Excell Seltzer, MD;  Location: [facility];  Service: General;  Laterality: Left;  . RESECTION COLOSTOMY/ CLOSURE COLOSTOMY WITH COLOPROCTOSTOMY  02-20-2007  . TONSILLECTOMY  AS CHILD  . URETEROSCOPY  07/04/2012   Procedure: URETEROSCOPY;  Surgeon: Claybon Jabs, MD;  Location: [facility];  Service: Urology;  Laterality: Left;  Marland Kitchen  VAGINAL HYSTERECTOMY  1998   Partial  . WISDOM TOOTH EXTRACTION     Social History   Occupational History    Employer: UNEMPLOYED  . Occupation: HUMAN RESOURCES     Employer: [employer]  Tobacco Use  . Smoking status: Never Smoker  . Smokeless tobacco: Never Used  Substance and Sexual Activity  . Alcohol use: Yes    Comment: rarely  . Drug use: No  . Sexual activity: Not on file

## 2019-12-16 ENCOUNTER — Ambulatory Visit: Payer: Federal, State, Local not specified - PPO

## 2019-12-18 ENCOUNTER — Ambulatory Visit: Payer: Federal, State, Local not specified - PPO

## 2019-12-22 ENCOUNTER — Other Ambulatory Visit: Payer: Self-pay | Admitting: Family Medicine

## 2019-12-22 ENCOUNTER — Encounter: Payer: Self-pay | Admitting: Family Medicine

## 2019-12-23 MED ORDER — BACLOFEN 10 MG PO TABS: 5.0000 mg | ORAL_TABLET | Freq: Three times a day (TID) | ORAL | 3 refills | Status: DC | PRN

## 2019-12-28 NOTE — Progress Notes (Signed)
Pharmacist Chemotherapy Monitoring - Follow Up Assessment    I verify that I have reviewed each item in the below checklist:  . Regimen for the patient is scheduled for the appropriate day and plan matches scheduled date. Marland Kitchen Appropriate non-routine labs are ordered dependent on drug ordered. . If applicable, additional medications reviewed and ordered per protocol based on lifetime cumulative doses and/or treatment regimen.   Plan for follow-up and/or issues identified: No . I-vent associated with next due treatment: No . MD and/or nursing notified: No  Summer [name] [facility] 12/28/2019 9:36 AM

## 2019-12-29 ENCOUNTER — Ambulatory Visit: Payer: Federal, State, Local not specified - PPO | Attending: Family Medicine | Admitting: Physical Therapy

## 2019-12-29 ENCOUNTER — Other Ambulatory Visit: Payer: Self-pay

## 2019-12-29 DIAGNOSIS — R29898 Other symptoms and signs involving the musculoskeletal system: Secondary | ICD-10-CM

## 2019-12-29 DIAGNOSIS — M6281 Muscle weakness (generalized): Secondary | ICD-10-CM | POA: Diagnosis present

## 2019-12-29 DIAGNOSIS — M25652 Stiffness of left hip, not elsewhere classified: Secondary | ICD-10-CM | POA: Insufficient documentation

## 2019-12-29 DIAGNOSIS — M79605 Pain in left leg: Secondary | ICD-10-CM | POA: Insufficient documentation

## 2019-12-29 DIAGNOSIS — M62838 Other muscle spasm: Secondary | ICD-10-CM | POA: Diagnosis present

## 2019-12-29 NOTE — Patient Instructions (Signed)
    Home exercise program created by Uma Jerde, PT.  For questions, please contact Felis Quillin via phone at [phone] or email at [email or web address]  [facility] [place] [address]  [address] [place], [place], [address] Phone: [phone]   Fax:  [phone]    

## 2019-12-30 NOTE — Therapy (Signed)
[facility] [address]  [place], [place], [address] Phone: [phone]   Fax:  [phone]  Physical Therapy Evaluation  Patient Details  Name: Summer [name] MRN: [id number] Date of Birth: [birth year] Referring Provider (PT): Eunice Blase, MD   Encounter Date: 12/29/2019  PT End of Session - 12/29/19 1616    Visit Number  1    Number of Visits  12    Date for PT Re-Evaluation  02/09/20    Authorization Type  Federal BC[employer]    Authorization - Number of Visits  --   VL = 50   PT Start Time  1616    PT Stop Time  [id number]    PT Time Calculation (min)  78 min    Activity Tolerance  Patient tolerated treatment well;Patient limited by pain    Behavior During Therapy  [facility] for tasks assessed/performed       Past Medical History:  Diagnosis Date  . Abdominal pain, left lower quadrant 11/09/2009   Qualifier: Diagnosis of  By: Valma Cava LPN, Izora Gala    . Allergic rhinitis 08/26/2008   Qualifier: Diagnosis of  By: Sherren Mocha MD, Jory Ee   . Anemia, iron deficiency 11/21/2018  . Arthritis shoulders and back  . BACK PAIN, CHRONIC 06/13/2007   Qualifier: Diagnosis of  By: Sherren Mocha MD, Jory Ee   . 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 Constipation 07/01/2018  . Costochondritis 05/04/2014  . Diverticulitis of colon 06/13/2007   S/p segmental colectomy for perforated diverticulitis   . Diverticulosis   . Facial pain 10/06/2015  . Fibromyalgia   . Foot sprain, left, initial encounter 08/31/2016  . Glossitis 02/02/2016  . Headache 04/24/2016  . HEMATURIA UNSPECIFIED 06/25/2008   Qualifier: Diagnosis of  By: Sherren Mocha MD, Jory Ee   . HEMATURIA, HX OF 11/14/2009   Qualifier: Diagnosis of  By: Regino Schultze CMA (AAMA), Apolonio Schneiders    . History of kidney stones   . History of kidney stones   . History of shingles 2012--  no residual pain  . Itching 10/13/2018  . Left eye pain 07/01/2018  . Left  ureteral calculus   . LLQ pain 04/24/2016  . LOC OSTEOARTHROS NOT SPEC PRIM/SEC OTH [facility] SITE 06/27/2009   Qualifier: Diagnosis of  By: Sherren Mocha MD, Jory Ee   . Malignant neoplasm of upper-outer quadrant of right breast in female, estrogen receptor positive ([place]) 11/17/2018  . MCI (mild cognitive impairment)   . Memory loss 12/21/2016  . Morbid obesity ([place]) 11/09/2010  . Pain of right side of body 08/31/2016  . Personal history of chemotherapy   . Personal history of radiation therapy   . Pruritus 10/26/2016  . Renal disorder   . Right sided temporal headache 12/21/2016  . Tremor 12/21/2016  . Trigeminal neuralgia RIGHT  . Trigeminal neuralgia of right side of face 11/30/2010  . Trochanteric bursitis, right hip 10/26/2016  . Urgency of urination   . Weight loss 07/22/2017  . Zoster without complications [id number]    Past Surgical History:  Procedure Laterality Date  . BREAST BIOPSY Right 11/11/2018  . BREAST LUMPECTOMY Right 11/28/2018  . BREAST LUMPECTOMY WITH RADIOACTIVE SEED AND SENTINEL LYMPH NODE BIOPSY Right 11/28/2018   Procedure: RIGHT BREAST LUMPECTOMY WITH RADIOACTIVE SEED AND RIGHT AXILLARY SENTINEL LYMPH NODE BIOPSY;  Surgeon: Excell Seltzer, MD;  Location: [facility];  Service: General;  Laterality: Right;  . CEREBRAL MICROVASCULAR  DECOMPRESSION  07-05-2006   RIGHT TRIGEMINAL NERVE  . CYSTOSCOPY/RETROGRADE/URETEROSCOPY/STONE EXTRACTION WITH BASKET  07/04/2012   Procedure: CYSTOSCOPY/RETROGRADE/URETEROSCOPY/STONE EXTRACTION WITH BASKET;  Surgeon: Claybon Jabs, MD;  Location: [facility];  Service: Urology;  Laterality: Left;  . EXPLORATORY LAPAROTOMY/ RESECTION MID TO DISTAL SIGMOID AND PROXIMAL RECTUM/ END PROXIMAL SIGMOID COLOSTOMY  07-17-2006   PERFORATED DIVERTICULITIS WITH PERITONITIS  . gamma knife  05/17/2016   for trigeminal neuralgia, [facility], Dr Salomon Fick  . gamma knife  2019  . IR CV LINE INJECTION  10/27/2019  . PORTACATH PLACEMENT Left 11/28/2018    Procedure: INSERTION PORT-A-CATH WITH ULTRASOUND;  Surgeon: Excell Seltzer, MD;  Location: [facility];  Service: General;  Laterality: Left;  . RESECTION COLOSTOMY/ CLOSURE COLOSTOMY WITH COLOPROCTOSTOMY  02-20-2007  . TONSILLECTOMY  AS CHILD  . URETEROSCOPY  07/04/2012   Procedure: URETEROSCOPY;  Surgeon: Claybon Jabs, MD;  Location: [facility];  Service: Urology;  Laterality: Left;  Marland Kitchen VAGINAL HYSTERECTOMY  1998   Partial  . WISDOM TOOTH EXTRACTION      There were no vitals filed for this visit.   Subjective Assessment - 12/29/19 1620    Subjective  Pt reports pain in L leg from hip down to toes starting ~1 month ago w/o known MOI. She originally thought it was related chronic knee pain for which she receives intermittent injections. 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    Limitations  Sitting;Standing;Walking;House hold activities    How long can you sit comfortably?  <5 minutes    How long can you stand comfortably?  unable for any period of time    How long can you walk comfortably?  varies    Diagnostic tests  Lumbar x-ray 12/15/19: She has scoliosis with diffuse degenerative changes and facet arthropathy.    Patient Stated Goals  "no pain"    Currently in Pain?  Yes    Pain Score  3    up to 10/10 when getting up to start walking   Pain Location  Leg    Pain Orientation  Left    Pain Descriptors / Indicators  Burning;Aching;Cramping;Pounding;Pressure;Radiating;Sore;Stabbing;Tender;Throbbing;Tightness    Pain Type  Acute pain    Pain Radiating Towards  hip to toes    Pain Onset  More than a month ago    Pain Frequency  Constant   varies in intensity   Aggravating Factors   prolonged sitting, transitional movements in/out bed or sit <> stand, walking    Pain  Relieving Factors  nothing other than slight relief from oxycodone or warm shower or Voltaren gel    Effect of Pain on Daily Activities  disrupts sleep, difficulty moving around, limited walking tolerance, prevents her from gardening         [facility] PT Assessment - 12/29/19 1616      Assessment   Medical Diagnosis  L LE pain    Referring Provider (PT)  Eunice Blase, MD    Onset Date/Surgical Date  --   ~1 month   Next MD Visit  TBD    Prior Therapy  PT s/p breast cancer      Precautions   Precautions  None      Restrictions   Weight Bearing Restrictions  No      Balance Screen  Has the patient fallen in the past 6 months  No    Has the patient had a decrease in activity level because of a fear of falling?   No    Is the patient reluctant to leave their home because of a fear of falling?   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 walking through building    Leisure  gardening, playing with cat, walking 2-3x/wk up to 1 hr      Cognition   Overall Cognitive Status  Within Functional Limits for tasks assessed      ROM / Strength   AROM / PROM / Strength  AROM;Strength      AROM   AROM Assessment Site  Lumbar    Lumbar Flexion  hands to mid shins    Lumbar Extension  50% limited    Lumbar - Right Side Bend  hand to lateral knee    Lumbar - Left Side Bend  hand to lateral knee    Lumbar - Right Rotation  50% limited    Lumbar - Left Rotation  40% limited      Strength   Strength Assessment Site  Hip;Knee;Ankle    Right/Left Hip  Right;Left    Right Hip Flexion  4/5     Right Hip Extension  4-/5    Right Hip External Rotation   4/5    Right Hip Internal Rotation  4/5    Right Hip ABduction  4/5    Right Hip ADduction  4/5    Left Hip Flexion  4-/5    Left Hip Extension  4/5    Left Hip External Rotation  4-/5    Left Hip Internal Rotation  4-/5    Left Hip ABduction  4/5    Left Hip ADduction  4/5    Right/Left Knee  Right;Left    Right Knee Flexion  4/5    Right Knee Extension  4+/5    Left Knee Flexion  4-/5    Left Knee Extension  4-/5    Right/Left Ankle  Right;Left    Right Ankle Dorsiflexion  4/5    Left Ankle Dorsiflexion  4-/5      Flexibility   Soft Tissue Assessment /Muscle Length  yes    Hamstrings  mod tight L>R    Quadriceps  mod/severe tight quads > hip flexors B    Piriformis  mod tight L>R      Palpation   Palpation comment  increased muscle tension & ttp t/o L glutes and piriformis; increased tension in L lumbar paraspinals but denies ttp       Ambulation/Gait   Assistive device  None    Gait Pattern  Antalgic;Decreased weight shift to left;Decreased stance time - left;Lateral trunk lean to right;Decreased stride length;Step-through pattern    Gait velocity  decreased                Objective  measurements completed on examination: See above findings.      [facility] Adult PT Treatment/Exercise - 12/29/19 1616      Lumbar Exercises: Stretches   Passive Hamstring Stretch  Left;30 seconds;1 rep    Passive Hamstring Stretch Limitations  supine with strap    Single Knee to Chest Stretch  Left;30 seconds;1 rep    Single Knee to Chest Stretch Limitations  opp LE straight    Hip Flexor Stretch  Left;30 seconds;2 reps    Hip Flexor Stretch Limitations  mod thomas with strap    Piriformis Stretch  Left;30 seconds;1 rep    Piriformis Stretch Limitations  supine KTOS    Figure 4 Stretch  30 seconds;1 rep;Supine;With overpressure      Modalities   Modalities  Electrical Stimulation;Moist Heat      Moist Heat Therapy    Number Minutes Moist Heat  15 Minutes    Moist Heat Location  Other (comment)   L buttocks & anterior thigh     Electrical Stimulation   Electrical Stimulation Location  L buttocks     Electrical Stimulation Action  IFC    Electrical Stimulation Parameters  80-150 Hz, intensity to pt tol x 15'    Electrical Stimulation Goals  Pain;Tone             PT Education - 12/29/19 1734    Education Details  PT eval findings, anticipated POC, initial HEP and role of estim    Person(s) Educated  Patient    Methods  Explanation;Demonstration;Verbal cues;Tactile cues;Handout    Comprehension  Verbalized understanding;Returned demonstration;Verbal cues required;Tactile cues required;Need further instruction       PT Short Term Goals - 12/29/19 1736      PT SHORT TERM GOAL #1   Title  Patient will be independent with initial HEP    Status  New    Target Date  01/12/20      PT SHORT TERM GOAL #2   Title  Patient will verbalize/demonstrate good awareness of neutral spine posture and proper body mechanics for daily tasks    Status  New    Target Date  01/19/20        PT Long Term Goals - 12/29/19 1736      PT LONG TERM GOAL #1   Title  Patient will be independent with ongoing/advanced HEP    Status  New    Target Date  02/09/20      PT LONG TERM GOAL #2   Title  Patient to demonstrate ability to achieve and maintain good spinal alignment/posturing    Status  New    Target Date  02/09/20      PT LONG TERM GOAL #3   Title  Patient to improve lumbar AROM to [facility] without L LE pain provocation    Status  New    Target Date  02/09/20      PT LONG TERM GOAL #4   Title  Patient will demonstrate improved B LE strength to >/= 4 to 4+/5 for improved stability and ease of mobility    Status  New    Target Date  02/09/20      PT LONG TERM GOAL #5   Title  Patient to report ability to perform ADLs, household and work-related tasks without increased L LE pain    Status  New     Target Date  02/09/20      PT LONG TERM GOAL #6   Title  Patient will  report no sleep disturbance due to L LE pain    Status  New    Target Date  02/09/20             Plan - 12/29/19 1736    Clinical Impression Statement  Lucia is a 69 y/o female who presents to OP PT for L LE of ~1 month duration without known MOI. 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 Pain spans L LE from hip to toes with varying location, quality, and intensity. 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 Deficits include decreased lumbar ROM, increased muscle and ttp t/o L glutes and piriformis as well as increased tension in L lumbar paraspinals w/o ttp, limited flexibility in proximal LE muscles, especially hamstrings, hip external rotators and quads/hip flexors (L>R) and mild LE weakness. Pain limits sitting, standing, walking and sleeping tolerance and interferes with work and home tasks including gardening. Valda will benefit from skilled PT services to address above impairments and allow for performance of normal daily activities with decreased pain interference.    Personal Factors and Comorbidities  Comorbidity 3+;Past/Current Experience;Time since onset of injury/illness/exacerbation    Comorbidities  h/o fibromyalgia, R hip trochanteric bursitis, breast cancer s/p R lumpectomy, peripheral neuropathy d/t chemo, B LE edema, muscle cramps, tremor, COVID-19    Examination-Activity Limitations  Bathing;Bed Mobility;Bend;Dressing;Hygiene/Grooming;Locomotion Level;Sit;Sleep;Squat;Stairs;Stand;Toileting;Transfers    Examination-Participation Restrictions  Cleaning;Community Activity;Driving;Laundry;Meal Prep;Shop    Stability/Clinical Decision Making  Evolving/Moderate complexity    Clinical Decision Making  Moderate    Rehab  Potential  Good    PT Frequency  2x / week    PT Duration  6 weeks    PT Treatment/Interventions  ADLs/Self Care Home Management;Cryotherapy;Electrical Stimulation;Iontophoresis 4mg /ml Dexamethasone;Moist Heat;DME Instruction;Gait training;Stair training;Functional mobility training;Therapeutic activities;Therapeutic exercise;Balance training;Neuromuscular re-education;Patient/family education;Manual techniques;Passive range of motion;Dry needling;Taping;Spinal Manipulations;Joint Manipulations    PT Next Visit Plan  Review initial HEP; L LE flexibility; lumbopelvic and L LE strengthening; posture & body mechanics education; manual therapy as indicated; modalities PRN - provide options for home TENS unit if interested    PT Home Exercise Plan  12/29/19 - SKTC, HS, mod thomas hip flexor, figure 4 and/or KTOS piriformis stretches    Consulted and Agree with Plan of Care  Patient       Patient will benefit from skilled therapeutic intervention in order to improve the following deficits and impairments:  Abnormal gait, Decreased activity tolerance, Decreased balance, Decreased endurance, Decreased knowledge of precautions, Decreased mobility, Decreased range of motion, Decreased safety awareness, Decreased strength, Difficulty walking, Increased fascial restricitons, Increased muscle spasms, Impaired perceived functional ability, Impaired flexibility, Impaired sensation, Improper body mechanics, Postural dysfunction, Pain  Visit Diagnosis: Pain in left leg  Other symptoms and signs involving the musculoskeletal system  Other muscle spasm  Stiffness of left hip, not elsewhere classified  Muscle weakness (generalized)     Problem List Patient Active Problem List   Diagnosis Date Noted  . COVID-19 11/06/2019  . Muscle cramps 05/12/2019  . Cramps, muscle, general 04/23/2019  . Tinnitus 04/16/2019  . Chest pain of uncertain etiology [id number]  . Bilateral leg edema 02/10/2019  . Port-A-Cath  in place 01/30/2019  . Anemia, iron deficiency 11/21/2018  . Malignant neoplasm of upper-outer quadrant of right breast in female, estrogen receptor positive ([place]) 11/17/2018  . Itching 10/13/2018  . Left eye pain 07/01/2018  .  Constipation 07/01/2018  . Weight loss 07/22/2017  . Memory loss 12/21/2016  . Tremor 12/21/2016  . Right sided temporal headache 12/21/2016  . Trochanteric bursitis, right hip 10/26/2016  . Pruritus 10/26/2016  . Foot sprain, left, initial encounter 08/31/2016  . Pain of right side of body 08/31/2016  . Headache 04/24/2016  . LLQ pain 04/24/2016  . Glossitis 02/02/2016  . Routine general medical examination at a health care facility 10/27/2015  . Facial pain 10/06/2015  . Trigeminal neuralgia of right side of face 11/30/2010  . Morbid obesity ([place]) 11/09/2010  . Fibromyalgia 11/09/2010  . HEMATURIA, HX OF 11/14/2009  . LOC OSTEOARTHROS NOT SPEC PRIM/SEC OTH [facility] SITE 06/27/2009  . Allergic rhinitis 08/26/2008  . 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

## 2020-01-01 ENCOUNTER — Other Ambulatory Visit: Payer: Self-pay

## 2020-01-01 ENCOUNTER — Inpatient Hospital Stay: Payer: Federal, State, Local not specified - PPO

## 2020-01-01 ENCOUNTER — Ambulatory Visit: Payer: Federal, State, Local not specified - PPO | Admitting: Family Medicine

## 2020-01-01 VITALS — BP 149/71 | HR 71 | Temp 98.5°F | Resp 18

## 2020-01-01 DIAGNOSIS — Z170 Estrogen receptor positive status [ER+]: Secondary | ICD-10-CM

## 2020-01-01 DIAGNOSIS — Z95828 Presence of other vascular implants and grafts: Secondary | ICD-10-CM

## 2020-01-01 DIAGNOSIS — D509 Iron deficiency anemia, unspecified: Secondary | ICD-10-CM

## 2020-01-01 DIAGNOSIS — C50411 Malignant neoplasm of upper-outer quadrant of right female breast: Secondary | ICD-10-CM | POA: Diagnosis not present

## 2020-01-01 MED ORDER — TRASTUZUMAB-DKST CHEMO 150 MG IV SOLR
6.0000 mg/kg | Freq: Once | INTRAVENOUS | Status: AC
  Administered 2020-01-01: 504 mg via INTRAVENOUS
  Filled 2020-01-01: qty 24

## 2020-01-01 MED ORDER — SODIUM CHLORIDE 0.9% FLUSH
10.0000 mL | Freq: Once | INTRAVENOUS | Status: AC
  Administered 2020-01-01: 10 mL
  Filled 2020-01-01: qty 10

## 2020-01-01 MED ORDER — SODIUM CHLORIDE 0.9 % IV SOLN
Freq: Once | INTRAVENOUS | Status: AC
  Filled 2020-01-01: qty 250

## 2020-01-01 MED ORDER — ACETAMINOPHEN 325 MG PO TABS: 650.0000 mg | ORAL_TABLET | Freq: Once | ORAL | Status: DC

## 2020-01-01 MED ORDER — DIPHENHYDRAMINE HCL 25 MG PO CAPS: 50.0000 mg | ORAL_CAPSULE | Freq: Once | ORAL | Status: DC

## 2020-01-01 MED ORDER — HEPARIN SOD (PORK) LOCK FLUSH 100 UNIT/ML IV SOLN
500.0000 [IU] | Freq: Once | INTRAVENOUS | Status: AC
  Administered 2020-01-01: 500 [IU]
  Filled 2020-01-01: qty 5

## 2020-01-01 NOTE — Patient Instructions (Signed)
[facility] Discharge Instructions for Patients Receiving Chemotherapy  Today you received the following chemotherapy agents: trastuzumab-dkst (ogivri)  To help prevent nausea and vomiting after your treatment, we encourage you to take your nausea medication as prescribed.   If you develop nausea and vomiting that is not controlled by your nausea medication, call the clinic.   BELOW ARE SYMPTOMS THAT SHOULD BE REPORTED IMMEDIATELY:  *FEVER GREATER THAN 100.5 F  *CHILLS WITH OR WITHOUT FEVER  NAUSEA AND VOMITING THAT IS NOT CONTROLLED WITH YOUR NAUSEA MEDICATION  *UNUSUAL SHORTNESS OF BREATH  *UNUSUAL BRUISING OR BLEEDING  TENDERNESS IN MOUTH AND THROAT WITH OR WITHOUT PRESENCE OF ULCERS  *URINARY PROBLEMS  *BOWEL PROBLEMS  UNUSUAL RASH Items with * indicate a potential emergency and should be followed up as soon as possible.  Feel free to call the clinic should you have any questions or concerns. The clinic phone number is [phone].  Please show the [place] at check-in to the Emergency Department and triage nurse.

## 2020-01-05 ENCOUNTER — Other Ambulatory Visit: Payer: Self-pay | Admitting: *Deleted

## 2020-01-05 ENCOUNTER — Encounter: Payer: Self-pay | Admitting: *Deleted

## 2020-01-06 ENCOUNTER — Ambulatory Visit: Payer: Federal, State, Local not specified - PPO | Admitting: Hematology

## 2020-01-06 ENCOUNTER — Other Ambulatory Visit: Payer: Federal, State, Local not specified - PPO

## 2020-01-06 ENCOUNTER — Ambulatory Visit: Payer: Federal, State, Local not specified - PPO

## 2020-01-07 ENCOUNTER — Ambulatory Visit: Payer: Federal, State, Local not specified - PPO

## 2020-01-07 ENCOUNTER — Other Ambulatory Visit: Payer: Self-pay

## 2020-01-07 DIAGNOSIS — M25652 Stiffness of left hip, not elsewhere classified: Secondary | ICD-10-CM

## 2020-01-07 DIAGNOSIS — M62838 Other muscle spasm: Secondary | ICD-10-CM

## 2020-01-07 DIAGNOSIS — R29898 Other symptoms and signs involving the musculoskeletal system: Secondary | ICD-10-CM

## 2020-01-07 DIAGNOSIS — M79605 Pain in left leg: Secondary | ICD-10-CM

## 2020-01-07 DIAGNOSIS — M6281 Muscle weakness (generalized): Secondary | ICD-10-CM

## 2020-01-07 NOTE — Patient Instructions (Addendum)
   This is helpful for muscle pain and spasm.   Search and Purchase a TENS 7000 2nd edition at www.tenspros.com or www.amazon.com  (It should be less than $30)     TENS unit instructions:   Do not shower or bathe with the unit on  Turn the unit off before removing electrodes or batteries  If the electrodes lose stickiness add a drop of water to the electrodes after they are disconnected from the unit and place on plastic sheet. If you continued to have difficulty, call the TENS unit company to purchase more electrodes.  Do not apply lotion on the skin area prior to use. Make sure the skin is clean and dry as this will help prolong the life of the electrodes.  After use, always check skin for unusual red areas, rash or other skin difficulties. If there are any skin problems, does not apply electrodes to the same area.  Never remove the electrodes from the unit by pulling the wires.  Do not use the TENS unit or electrodes other than as directed.  Do not change electrode placement without consulting your therapist or physician.  Keep 2 fingers with between each electrode.

## 2020-01-07 NOTE — Therapy (Signed)
[facility] [address]  [place], [place], [address] Phone: [phone]   Fax:  [phone]  Physical Therapy Treatment  Patient Details  Name: Summer [name] MRN: [id number] Date of Birth: 02-Jun-1951 Referring Provider (PT): Eunice Blase, MD   Encounter Date: 01/07/2020  PT End of Session - 01/07/20 1626    Visit Number  2    Number of Visits  12    Date for PT Re-Evaluation  02/09/20    Authorization Type  Federal BCBS    Authorization - Number of Visits  --   VL = 50   PT Start Time  [id number]    PT Stop Time  1705    PT Time Calculation (min)  48 min    Activity Tolerance  Patient tolerated treatment well;Patient limited by pain    Behavior During Therapy  [facility] for tasks assessed/performed       Past Medical History:  Diagnosis Date  . Abdominal pain, left lower quadrant 11/09/2009   Qualifier: Diagnosis of  By: Valma Cava LPN, Izora Gala    . Allergic rhinitis 08/26/2008   Qualifier: Diagnosis of  By: Sherren Mocha MD, Jory Ee   . Anemia, iron deficiency 11/21/2018  . Arthritis shoulders and back  . BACK PAIN, CHRONIC 06/13/2007   Qualifier: Diagnosis of  By: Sherren Mocha MD, Jory Ee   . 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 Constipation 07/01/2018  . Costochondritis 05/04/2014  . Diverticulitis of colon 06/13/2007   S/p segmental colectomy for perforated diverticulitis   . Diverticulosis   . Facial pain 10/06/2015  . Fibromyalgia   . Foot sprain, left, initial encounter 08/31/2016  . Glossitis 02/02/2016  . Headache 04/24/2016  . HEMATURIA UNSPECIFIED 06/25/2008   Qualifier: Diagnosis of  By: Sherren Mocha MD, Jory Ee   . HEMATURIA, HX OF 11/14/2009   Qualifier: Diagnosis of  By: Regino Schultze CMA (AAMA), Apolonio Schneiders    . History of kidney stones   . History of kidney stones   . History of shingles 2012--  no residual pain  . Itching 10/13/2018  . Left eye pain 07/01/2018  . Left  ureteral calculus   . LLQ pain 04/24/2016  . LOC OSTEOARTHROS NOT SPEC PRIM/SEC OTH [facility] SITE 06/27/2009   Qualifier: Diagnosis of  By: Sherren Mocha MD, Jory Ee   . Malignant neoplasm of upper-outer quadrant of right breast in female, estrogen receptor positive ([place]) 11/17/2018  . MCI (mild cognitive impairment)   . Memory loss 12/21/2016  . Morbid obesity ([place]) 11/09/2010  . Pain of right side of body 08/31/2016  . Personal history of chemotherapy   . Personal history of radiation therapy   . Pruritus 10/26/2016  . Renal disorder   . Right sided temporal headache 12/21/2016  . Tremor 12/21/2016  . Trigeminal neuralgia RIGHT  . Trigeminal neuralgia of right side of face 11/30/2010  . Trochanteric bursitis, right hip 10/26/2016  . Urgency of urination   . Weight loss 07/22/2017  . Zoster without complications [id number]    Past Surgical History:  Procedure Laterality Date  . BREAST BIOPSY Right 11/11/2018  . BREAST LUMPECTOMY Right 11/28/2018  . BREAST LUMPECTOMY WITH RADIOACTIVE SEED AND SENTINEL LYMPH NODE BIOPSY Right 11/28/2018   Procedure: RIGHT BREAST LUMPECTOMY WITH RADIOACTIVE SEED AND RIGHT AXILLARY SENTINEL LYMPH NODE BIOPSY;  Surgeon: Excell Seltzer, MD;  Location: [place];  Service: General;  Laterality: Right;  . CEREBRAL MICROVASCULAR  DECOMPRESSION  07-05-2006   RIGHT TRIGEMINAL NERVE  . CYSTOSCOPY/RETROGRADE/URETEROSCOPY/STONE EXTRACTION WITH BASKET  07/04/2012   Procedure: CYSTOSCOPY/RETROGRADE/URETEROSCOPY/STONE EXTRACTION WITH BASKET;  Surgeon: Claybon Jabs, MD;  Location: [facility];  Service: Urology;  Laterality: Left;  . EXPLORATORY LAPAROTOMY/ RESECTION MID TO DISTAL SIGMOID AND PROXIMAL RECTUM/ END PROXIMAL SIGMOID COLOSTOMY  07-17-2006   PERFORATED DIVERTICULITIS WITH PERITONITIS  . gamma knife  05/17/2016   for trigeminal neuralgia, [facility], Dr Salomon Fick  . gamma knife  2019  . IR CV LINE INJECTION  10/27/2019  . PORTACATH PLACEMENT Left 11/28/2018    Procedure: INSERTION PORT-A-CATH WITH ULTRASOUND;  Surgeon: Excell Seltzer, MD;  Location: [place];  Service: General;  Laterality: Left;  . RESECTION COLOSTOMY/ CLOSURE COLOSTOMY WITH COLOPROCTOSTOMY  02-20-2007  . TONSILLECTOMY  AS CHILD  . URETEROSCOPY  07/04/2012   Procedure: URETEROSCOPY;  Surgeon: Claybon Jabs, MD;  Location[facility];  Service: Urology;  Laterality: Left;  Marland Kitchen VAGINAL HYSTERECTOMY  1998   Partial  . WISDOM TOOTH EXTRACTION      There were no vitals filed for this visit.  Subjective Assessment - 01/07/20 1757    Subjective  Pt. reporting she only tried 2 of 5 HEP activities.    Diagnostic tests  Lumbar x-ray 12/15/19: She has scoliosis with diffuse degenerative changes and facet arthropathy.    Patient Stated Goals  "no pain"    Currently in Pain?  No/denies    Pain Score  0-No pain   up to a 5/10 at times   Pain Location  Leg    Pain Orientation  Left    Pain Descriptors / Indicators  Burning;Aching;Cramping    Pain Type  Acute pain    Pain Radiating Towards  hip to toes    Pain Onset  More than a month ago    Aggravating Factors   prolonged sitting    Multiple Pain Sites  No                       [facility] Adult PT Treatment/Exercise - 01/07/20 0001      Self-Care   Self-Care  Other Self-Care Comments    Other Self-Care Comments   Instruction in proper posture and body mechanics with daily activities to reduce lumbar strain with special focus on desk positioining for pt. job tasks      Lumbar Exercises: Stretches   Passive Hamstring Stretch  Left;30 seconds;2 reps   Increased time required due to pt. tendancy to conversate   Passive Hamstring Stretch Limitations  supine with strap    Single Knee to Chest Stretch  Left;2 reps;30 seconds   Increased time required due to pt. tendancy to conversate   Single Knee to Chest Stretch Limitations  opp LE straight    Lower Trunk Rotation Limitations  5" x 10 reps    Increased time  required due to pt. tendancy to conversate   Hip Flexor Stretch  Left;30 seconds;2 reps   Increased time required due to pt. tendancy to conversate   Hip Flexor Stretch Limitations  mod thomas with strap    Piriformis Stretch  Left;30 seconds;2 reps   Increased time required due to pt. tendancy to conversate   Piriformis Stretch Limitations  supine KTOS    Figure 4 Stretch  30 seconds;1 rep;Supine;With overpressure   Increased time required due to pt. tendancy to conversate   Figure 4 Stretch Limitations  with strap assistance  Lumbar Exercises: Aerobic   Nustep  Lvl 1, 6 min (UE/LE)               PT Short Term Goals - 01/07/20 1629      PT SHORT TERM GOAL #1   Title  Patient will be independent with initial HEP    Status  On-going    Target Date  01/12/20      PT SHORT TERM GOAL #2   Title  Patient will verbalize/demonstrate good awareness of neutral spine posture and proper body mechanics for daily tasks    Status  On-going    Target Date  01/19/20        PT Long Term Goals - 01/07/20 1629      PT LONG TERM GOAL #1   Title  Patient will be independent with ongoing/advanced HEP    Status  On-going      PT LONG TERM GOAL #2   Title  Patient to demonstrate ability to achieve and maintain good spinal alignment/posturing    Status  On-going      PT LONG TERM GOAL #3   Title  Patient to improve lumbar AROM to [facility] without L LE pain provocation    Status  On-going      PT LONG TERM GOAL #4   Title  Patient will demonstrate improved B LE strength to >/= 4 to 4+/5 for improved stability and ease of mobility    Status  On-going      PT LONG TERM GOAL #5   Title  Patient to report ability to perform ADLs, household and work-related tasks without increased L LE pain    Status  On-going      PT LONG TERM GOAL #6   Title  Patient will report no sleep disturbance due to L LE pain    Status  On-going            Plan - 01/07/20 1801    Clinical Impression  Statement  Pt. admitting to poor HEP adherence - only performed 2/5 HEP activities since last visit.  Session limited by pt. tendency to conversate and require constant cueing from therapist for proper LE stretch hold times.  Covered posture and body mechanics with daily activities as to reduce lumbar strain focusing on proper desk positioning at work.  Pain well managed today early on in session however pt. with heavy complaint of L lateral LE pain with SKTC, glute stretching which seemed unrelated to positioning.  Overall pt. with good HEP tolerance.  Will plan to monitor tolerance to HEP and continues progress lumbopelvic strengthening and flexibility per pt. response.    Comorbidities  h/o fibromyalgia, R hip trochanteric bursitis, breast cancer s/p R lumpectomy, peripheral neuropathy d/t chemo, B LE edema, muscle cramps, tremor, COVID-19    Rehab Potential  Good    PT Treatment/Interventions  ADLs/Self Care Home Management;Cryotherapy;Electrical Stimulation;Iontophoresis 4mg /ml Dexamethasone;Moist Heat;DME Instruction;Gait training;Stair training;Functional mobility training;Therapeutic activities;Therapeutic exercise;Balance training;Neuromuscular re-education;Patient/family education;Manual techniques;Passive range of motion;Dry needling;Taping;Spinal Manipulations;Joint Manipulations    PT Next Visit Plan  L LE flexibility; lumbopelvic and L LE strengthening; posture & body mechanics education; manual therapy as indicated; modalities PRN - provide options for home TENS unit if interested    PT Home Exercise Plan  12/29/19 - SKTC, HS, mod thomas hip flexor, figure 4 and/or KTOS piriformis stretches    Consulted and Agree with Plan of Care  Patient       Patient will benefit from skilled therapeutic intervention  in order to improve the following deficits and impairments:  Abnormal gait, Decreased activity tolerance, Decreased balance, Decreased endurance, Decreased knowledge of precautions, Decreased  mobility, Decreased range of motion, Decreased safety awareness, Decreased strength, Difficulty walking, Increased fascial restricitons, Increased muscle spasms, Impaired perceived functional ability, Impaired flexibility, Impaired sensation, Improper body mechanics, Postural dysfunction, Pain  Visit Diagnosis: Pain in left leg  Other symptoms and signs involving the musculoskeletal system  Other muscle spasm  Stiffness of left hip, not elsewhere classified  Muscle weakness (generalized)     Problem List Patient Active Problem List   Diagnosis Date Noted  . COVID-19 11/06/2019  . Muscle cramps 05/12/2019  . Cramps, muscle, general 04/23/2019  . Tinnitus 04/16/2019  . Chest pain of uncertain etiology [id number]  . Bilateral leg edema 02/10/2019  . Port-A-Cath in place 01/30/2019  . Anemia, iron deficiency 11/21/2018  . Malignant neoplasm of upper-outer quadrant of right breast in female, estrogen receptor positive ([place]) 11/17/2018  . Itching 10/13/2018  . Left eye pain 07/01/2018  . Constipation 07/01/2018  . Weight loss 07/22/2017  . Memory loss 12/21/2016  . Tremor 12/21/2016  . Right sided temporal headache 12/21/2016  . Trochanteric bursitis, right hip 10/26/2016  . Pruritus 10/26/2016  . Foot sprain, left, initial encounter 08/31/2016  . Pain of right side of body 08/31/2016  . Headache 04/24/2016  . LLQ pain 04/24/2016  . Glossitis 02/02/2016  . Routine general medical examination at a health care facility 10/27/2015  . Facial pain 10/06/2015  . Trigeminal neuralgia of right side of face 11/30/2010  . Morbid obesity ([place]) 11/09/2010  . Fibromyalgia 11/09/2010  . HEMATURIA, HX OF 11/14/2009  . LOC OSTEOARTHROS NOT SPEC PRIM/SEC OTH [facility] SITE 06/27/2009  . Allergic rhinitis 08/26/2008  . VULVA INTRAEPITHELIAL NEOPLASIA, 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    Bess Harvest, PTA 01/07/20 6:12 PM   [facility] [address]  [place], [place], [address] Phone: [phone]   Fax:  [phone]  Name: Summer [name] MRN: [id number] Date of Birth: 1951/09/03

## 2020-01-08 ENCOUNTER — Other Ambulatory Visit: Payer: Federal, State, Local not specified - PPO

## 2020-01-08 ENCOUNTER — Ambulatory Visit: Payer: Federal, State, Local not specified - PPO

## 2020-01-08 ENCOUNTER — Ambulatory Visit: Payer: Federal, State, Local not specified - PPO | Admitting: Hematology

## 2020-01-11 ENCOUNTER — Ambulatory Visit: Payer: Federal, State, Local not specified - PPO | Attending: Family Medicine

## 2020-01-11 DIAGNOSIS — M6281 Muscle weakness (generalized): Secondary | ICD-10-CM | POA: Insufficient documentation

## 2020-01-11 DIAGNOSIS — M25652 Stiffness of left hip, not elsewhere classified: Secondary | ICD-10-CM | POA: Insufficient documentation

## 2020-01-11 DIAGNOSIS — M79605 Pain in left leg: Secondary | ICD-10-CM | POA: Insufficient documentation

## 2020-01-11 DIAGNOSIS — R29898 Other symptoms and signs involving the musculoskeletal system: Secondary | ICD-10-CM | POA: Insufficient documentation

## 2020-01-11 DIAGNOSIS — M62838 Other muscle spasm: Secondary | ICD-10-CM | POA: Insufficient documentation

## 2020-01-13 ENCOUNTER — Ambulatory Visit (HOSPITAL_COMMUNITY)
Admission: RE | Admit: 2020-01-13 | Discharge: 2020-01-13 | Disposition: A | Discharge status: Home / Self Care | Payer: Federal, State, Local not specified - PPO | Source: Ambulatory Visit | Attending: Hematology | Admitting: Hematology

## 2020-01-13 ENCOUNTER — Ambulatory Visit: Payer: Federal, State, Local not specified - PPO

## 2020-01-13 ENCOUNTER — Other Ambulatory Visit: Payer: Self-pay

## 2020-01-13 DIAGNOSIS — M6281 Muscle weakness (generalized): Secondary | ICD-10-CM | POA: Diagnosis present

## 2020-01-13 DIAGNOSIS — M25652 Stiffness of left hip, not elsewhere classified: Secondary | ICD-10-CM | POA: Diagnosis present

## 2020-01-13 DIAGNOSIS — M79605 Pain in left leg: Secondary | ICD-10-CM

## 2020-01-13 DIAGNOSIS — R29898 Other symptoms and signs involving the musculoskeletal system: Secondary | ICD-10-CM

## 2020-01-13 DIAGNOSIS — Z01818 Encounter for other preprocedural examination: Secondary | ICD-10-CM | POA: Diagnosis present

## 2020-01-13 DIAGNOSIS — Z170 Estrogen receptor positive status [ER+]: Secondary | ICD-10-CM | POA: Diagnosis not present

## 2020-01-13 DIAGNOSIS — M62838 Other muscle spasm: Secondary | ICD-10-CM

## 2020-01-13 DIAGNOSIS — C50411 Malignant neoplasm of upper-outer quadrant of right female breast: Secondary | ICD-10-CM | POA: Diagnosis not present

## 2020-01-13 NOTE — Therapy (Signed)
[facility] [address]  [place], [place], [address] Phone: [phone]   Fax:  [phone]  Physical Therapy Treatment  Patient Details  Name: Summer [name] MRN: [id number] Date of Birth: [birth year] Referring Provider (PT): Eunice Blase, MD   Encounter Date: 01/13/2020  PT End of Session - 01/13/20 1624    Visit Number  3    Number of Visits  12    Date for PT Re-Evaluation  02/09/20    Authorization Type  Federal BCBS    Authorization - Number of Visits  --   VL = 50   PT Start Time  8338    PT Stop Time  1705    PT Time Calculation (min)  48 min    Activity Tolerance  Patient tolerated treatment well;Patient limited by pain    Behavior During Therapy  [facility] for tasks assessed/performed       Past Medical History:  Diagnosis Date  . Abdominal pain, left lower quadrant 11/09/2009   Qualifier: Diagnosis of  By: Valma Cava LPN, Izora Gala    . Allergic rhinitis 08/26/2008   Qualifier: Diagnosis of  By: Sherren Mocha MD, Jory Ee   . Anemia, iron deficiency 11/21/2018  . Arthritis shoulders and back  . BACK PAIN, CHRONIC 06/13/2007   Qualifier: Diagnosis of  By: Sherren Mocha MD, Jory Ee   . 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 Constipation 07/01/2018  . Costochondritis 05/04/2014  . Diverticulitis of colon 06/13/2007   S/p segmental colectomy for perforated diverticulitis   . Diverticulosis   . Facial pain 10/06/2015  . Fibromyalgia   . Foot sprain, left, initial encounter 08/31/2016  . Glossitis 02/02/2016  . Headache 04/24/2016  . HEMATURIA UNSPECIFIED 06/25/2008   Qualifier: Diagnosis of  By: Sherren Mocha MD, Jory Ee   . HEMATURIA, HX OF 11/14/2009   Qualifier: Diagnosis of  By: Regino Schultze CMA (AAMA), Apolonio Schneiders    . History of kidney stones   . History of kidney stones   . History of shingles 2012--  no residual pain  . Itching 10/13/2018  . Left eye pain 07/01/2018  . Left  ureteral calculus   . LLQ pain 04/24/2016  . LOC OSTEOARTHROS NOT SPEC PRIM/SEC OTH [facility] SITE 06/27/2009   Qualifier: Diagnosis of  By: Sherren Mocha MD, Jory Ee   . Malignant neoplasm of upper-outer quadrant of right breast in female, estrogen receptor positive ([place]) 11/17/2018  . MCI (mild cognitive impairment)   . Memory loss 12/21/2016  . Morbid obesity ([place]) 11/09/2010  . Pain of right side of body 08/31/2016  . Personal history of chemotherapy   . Personal history of radiation therapy   . Pruritus 10/26/2016  . Renal disorder   . Right sided temporal headache 12/21/2016  . Tremor 12/21/2016  . Trigeminal neuralgia RIGHT  . Trigeminal neuralgia of right side of face 11/30/2010  . Trochanteric bursitis, right hip 10/26/2016  . Urgency of urination   . Weight loss 07/22/2017  . Zoster without complications 2/50/5397    Past Surgical History:  Procedure Laterality Date  . BREAST BIOPSY Right 11/11/2018  . BREAST LUMPECTOMY Right 11/28/2018  . BREAST LUMPECTOMY WITH RADIOACTIVE SEED AND SENTINEL LYMPH NODE BIOPSY Right 11/28/2018   Procedure: RIGHT BREAST LUMPECTOMY WITH RADIOACTIVE SEED AND RIGHT AXILLARY SENTINEL LYMPH NODE BIOPSY;  Surgeon: Excell Seltzer, MD;  Location: [place];  Service: General;  Laterality: Right;  . CEREBRAL MICROVASCULAR  DECOMPRESSION  07-05-2006   RIGHT TRIGEMINAL NERVE  . CYSTOSCOPY/RETROGRADE/URETEROSCOPY/STONE EXTRACTION WITH BASKET  07/04/2012   Procedure: CYSTOSCOPY/RETROGRADE/URETEROSCOPY/STONE EXTRACTION WITH BASKET;  Surgeon: Claybon Jabs, MD;  Location: [facility];  Service: Urology;  Laterality: Left;  . EXPLORATORY LAPAROTOMY/ RESECTION MID TO DISTAL SIGMOID AND PROXIMAL RECTUM/ END PROXIMAL SIGMOID COLOSTOMY  07-17-2006   PERFORATED DIVERTICULITIS WITH PERITONITIS  . gamma knife  05/17/2016   for trigeminal neuralgia, [facility], Dr Salomon Fick  . gamma knife  2019  . IR CV LINE INJECTION  10/27/2019  . PORTACATH PLACEMENT Left 11/28/2018    Procedure: INSERTION PORT-A-CATH WITH ULTRASOUND;  Surgeon: Excell Seltzer, MD;  Location: [place];  Service: General;  Laterality: Left;  . RESECTION COLOSTOMY/ CLOSURE COLOSTOMY WITH COLOPROCTOSTOMY  02-20-2007  . TONSILLECTOMY  AS CHILD  . URETEROSCOPY  07/04/2012   Procedure: URETEROSCOPY;  Surgeon: Claybon Jabs, MD;  Location[facility];  Service: Urology;  Laterality: Left;  Marland Kitchen VAGINAL HYSTERECTOMY  1998   Partial  . WISDOM TOOTH EXTRACTION      There were no vitals filed for this visit.  Subjective Assessment - 01/13/20 1622    Subjective  Pt. purchased a TENS unit and wishes to be instructed.    Diagnostic tests  Lumbar x-ray 12/15/19: She has scoliosis with diffuse degenerative changes and facet arthropathy.    Currently in Pain?  Yes    Pain Score  4     Pain Location  Leg    Pain Orientation  Left    Pain Descriptors / Indicators  Burning;Aching;Cramping    Pain Radiating Towards  L knee to toes    Pain Onset  More than a month ago    Pain Frequency  Constant                       [facility] Adult PT Treatment/Exercise - 01/13/20 0001      Self-Care   Self-Care  Other Self-Care Comments    Other Self-Care Comments   Instruction on home TENS unit proper electrode placement, setup for parameters and demo on pt. L lateral knee for understanding; pt. verbalized understanding       Lumbar Exercises: Stretches   Passive Hamstring Stretch  Left;30 seconds;1 rep    Passive Hamstring Stretch Limitations  supine with strap    Lower Trunk Rotation Limitations  5" x 10 reps     Hip Flexor Stretch  Left;30 seconds;1 rep    Hip Flexor Stretch Limitations  mod thomas with strap    Piriformis Stretch  Left;30 seconds;2 reps    Piriformis Stretch Limitations  supine KTOS      Lumbar Exercises: Aerobic   Nustep  Lvl 2, 6 min (UE/LE)               PT Short Term Goals - 01/13/20 1638      PT SHORT TERM GOAL #1   Title  Patient will be  independent with initial HEP    Status  Partially Met   01/13/20:   Target Date  01/12/20      PT SHORT TERM GOAL #2   Title  Patient will verbalize/demonstrate good awareness of neutral spine posture and proper body mechanics for daily tasks    Status  On-going    Target Date  01/19/20        PT Long Term Goals - 01/07/20 1629      PT LONG TERM GOAL #1  Title  Patient will be independent with ongoing/advanced HEP    Status  On-going      PT LONG TERM GOAL #2   Title  Patient to demonstrate ability to achieve and maintain good spinal alignment/posturing    Status  On-going      PT LONG TERM GOAL #3   Title  Patient to improve lumbar AROM to [facility] without L LE pain provocation    Status  On-going      PT LONG TERM GOAL #4   Title  Patient will demonstrate improved B LE strength to >/= 4 to 4+/5 for improved stability and ease of mobility    Status  On-going      PT LONG TERM GOAL #5   Title  Patient to report ability to perform ADLs, household and work-related tasks without increased L LE pain    Status  On-going      PT LONG TERM GOAL #6   Title  Patient will report no sleep disturbance due to L LE pain    Status  On-going            Plan - 01/13/20 1625    Clinical Impression Statement  Pt. verbalized understanding of HEP however admitting to limited compliance since last visit.  STG #1 partially achieved.  Session focused on review and instruction of pt. purchased home TENS unit with education on proper electrode placement for L lateral knee pain relief along with unit setup and parameters.  Pt. verbalized understanding of TENS unit after instruction however may require further review in coming sessions.  Also progressed lumbar ROM and lumbopelvic flexibility activities with cueing required for proper holds times.  Ended visit with pt. verbalizing she will plan for improved HEP performance over this coming week.    Comorbidities  h/o fibromyalgia, R hip trochanteric  bursitis, breast cancer s/p R lumpectomy, peripheral neuropathy d/t chemo, B LE edema, muscle cramps, tremor, COVID-19    Rehab Potential  Good    PT Frequency  2x / week    PT Treatment/Interventions  ADLs/Self Care Home Management;Cryotherapy;Electrical Stimulation;Iontophoresis 6m/ml Dexamethasone;Moist Heat;DME Instruction;Gait training;Stair training;Functional mobility training;Therapeutic activities;Therapeutic exercise;Balance training;Neuromuscular re-education;Patient/family education;Manual techniques;Passive range of motion;Dry needling;Taping;Spinal Manipulations;Joint Manipulations    PT Next Visit Plan  L LE flexibility; lumbopelvic and L LE strengthening; posture & body mechanics education; manual therapy as indicated; modalities PRN - provide options for home TENS unit if interested    PT Home Exercise Plan  12/29/19 - SKTC, HS, mod thomas hip flexor, figure 4 and/or KTOS piriformis stretches    Consulted and Agree with Plan of Care  Patient       Patient will benefit from skilled therapeutic intervention in order to improve the following deficits and impairments:  Abnormal gait, Decreased activity tolerance, Decreased balance, Decreased endurance, Decreased knowledge of precautions, Decreased mobility, Decreased range of motion, Decreased safety awareness, Decreased strength, Difficulty walking, Increased fascial restricitons, Increased muscle spasms, Impaired perceived functional ability, Impaired flexibility, Impaired sensation, Improper body mechanics, Postural dysfunction, Pain  Visit Diagnosis: Pain in left leg  Other symptoms and signs involving the musculoskeletal system  Other muscle spasm  Stiffness of left hip, not elsewhere classified  Muscle weakness (generalized)     Problem List Patient Active Problem List   Diagnosis Date Noted  . COVID-19 11/06/2019  . Muscle cramps 05/12/2019  . Cramps, muscle, general 04/23/2019  . Tinnitus 04/16/2019  . Chest pain  of uncertain etiology 045/40/9811 . Bilateral leg edema 02/10/2019  .  Port-A-Cath in place 01/30/2019  . Anemia, iron deficiency 11/21/2018  . Malignant neoplasm of upper-outer quadrant of right breast in female, estrogen receptor positive ([place]) 11/17/2018  . Itching 10/13/2018  . Left eye pain 07/01/2018  . Constipation 07/01/2018  . Weight loss 07/22/2017  . Memory loss 12/21/2016  . Tremor 12/21/2016  . Right sided temporal headache 12/21/2016  . Trochanteric bursitis, right hip 10/26/2016  . Pruritus 10/26/2016  . Foot sprain, left, initial encounter 08/31/2016  . Pain of right side of body 08/31/2016  . Headache 04/24/2016  . LLQ pain 04/24/2016  . Glossitis 02/02/2016  . Routine general medical examination at a health care facility 10/27/2015  . Facial pain 10/06/2015  . Trigeminal neuralgia of right side of face 11/30/2010  . Morbid obesity ([place]) 11/09/2010  . Fibromyalgia 11/09/2010  . HEMATURIA, HX OF 11/14/2009  . LOC OSTEOARTHROS NOT SPEC PRIM/SEC OTH [facility] SITE 06/27/2009  . Allergic rhinitis 08/26/2008  . 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

## 2020-01-13 NOTE — Progress Notes (Signed)
  Echocardiogram 2D Echocardiogram has been performed.  Summer [name] 01/13/2020, 10:49 AM

## 2020-01-14 ENCOUNTER — Encounter: Payer: Self-pay | Admitting: Hematology

## 2020-01-18 ENCOUNTER — Other Ambulatory Visit: Payer: Self-pay

## 2020-01-18 ENCOUNTER — Encounter: Payer: Self-pay | Admitting: Physical Therapy

## 2020-01-18 ENCOUNTER — Ambulatory Visit: Payer: Federal, State, Local not specified - PPO | Admitting: Physical Therapy

## 2020-01-18 DIAGNOSIS — R29898 Other symptoms and signs involving the musculoskeletal system: Secondary | ICD-10-CM

## 2020-01-18 DIAGNOSIS — M62838 Other muscle spasm: Secondary | ICD-10-CM

## 2020-01-18 DIAGNOSIS — M79605 Pain in left leg: Secondary | ICD-10-CM

## 2020-01-18 DIAGNOSIS — M25652 Stiffness of left hip, not elsewhere classified: Secondary | ICD-10-CM

## 2020-01-18 DIAGNOSIS — M6281 Muscle weakness (generalized): Secondary | ICD-10-CM

## 2020-01-18 NOTE — Therapy (Addendum)
[facility] [address]  [place], [place], [address] Phone: [phone]   Fax:  [phone]  Physical Therapy Treatment / Discharge Summary  Patient Details  Name: Summer [name] MRN: [id number] Date of Birth: 08/15/1951 Referring Provider (PT): Eunice Blase, MD   Encounter Date: 01/18/2020  PT End of Session - 01/18/20 1615    Visit Number  4    Number of Visits  12    Date for PT Re-Evaluation  02/09/20    Authorization Type  Federal [employer]    Authorization - Number of Visits  --   VL = 50   PT Start Time  2563    PT Stop Time  1707    PT Time Calculation (min)  52 min    Activity Tolerance  Patient tolerated treatment well;No increased pain    Behavior During Therapy  WFL for tasks assessed/performed       Past Medical History:  Diagnosis Date  . Abdominal pain, left lower quadrant 11/09/2009   Qualifier: Diagnosis of  By: Valma Cava LPN, Izora Gala    . Allergic rhinitis 08/26/2008   Qualifier: Diagnosis of  By: Sherren Mocha MD, Jory Ee   . Anemia, iron deficiency 11/21/2018  . Arthritis shoulders and back  . BACK PAIN, CHRONIC 06/13/2007   Qualifier: Diagnosis of  By: Sherren Mocha MD, Jory Ee   . 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 Constipation 07/01/2018  . Costochondritis 05/04/2014  . Diverticulitis of colon 06/13/2007   S/p segmental colectomy for perforated diverticulitis   . Diverticulosis   . Facial pain 10/06/2015  . Fibromyalgia   . Foot sprain, left, initial encounter 08/31/2016  . Glossitis 02/02/2016  . Headache 04/24/2016  . HEMATURIA UNSPECIFIED 06/25/2008   Qualifier: Diagnosis of  By: Sherren Mocha MD, Jory Ee   . HEMATURIA, HX OF 11/14/2009   Qualifier: Diagnosis of  By: Regino Schultze CMA (AAMA), Apolonio Schneiders    . History of kidney stones   . History of kidney stones   . History of shingles 2012--  no residual pain  . Itching 10/13/2018  . Left eye pain 07/01/2018   . Left ureteral calculus   . LLQ pain 04/24/2016  . LOC OSTEOARTHROS NOT SPEC PRIM/SEC OTH [facility] SITE 06/27/2009   Qualifier: Diagnosis of  By: Sherren Mocha MD, Jory Ee   . Malignant neoplasm of upper-outer quadrant of right breast in female, estrogen receptor positive ([place]) 11/17/2018  . MCI (mild cognitive impairment)   . Memory loss 12/21/2016  . Morbid obesity ([place]) 11/09/2010  . Pain of right side of body 08/31/2016  . Personal history of chemotherapy   . Personal history of radiation therapy   . Pruritus 10/26/2016  . Renal disorder   . Right sided temporal headache 12/21/2016  . Tremor 12/21/2016  . Trigeminal neuralgia RIGHT  . Trigeminal neuralgia of right side of face 11/30/2010  . Trochanteric bursitis, right hip 10/26/2016  . Urgency of urination   . Weight loss 07/22/2017  . Zoster without complications 8/93/7342    Past Surgical History:  Procedure Laterality Date  . BREAST BIOPSY Right 11/11/2018  . BREAST LUMPECTOMY Right 11/28/2018  . BREAST LUMPECTOMY WITH RADIOACTIVE SEED AND SENTINEL LYMPH NODE BIOPSY Right 11/28/2018   Procedure: RIGHT BREAST LUMPECTOMY WITH RADIOACTIVE SEED AND RIGHT AXILLARY SENTINEL LYMPH NODE BIOPSY;  Surgeon: Excell Seltzer, MD;  Location: [facility];  Service: General;  Laterality: Right;  .  CEREBRAL MICROVASCULAR DECOMPRESSION  07-05-2006   RIGHT TRIGEMINAL NERVE  . CYSTOSCOPY/RETROGRADE/URETEROSCOPY/STONE EXTRACTION WITH BASKET  07/04/2012   Procedure: CYSTOSCOPY/RETROGRADE/URETEROSCOPY/STONE EXTRACTION WITH BASKET;  Surgeon: Claybon Jabs, MD;  Location[facility];  Service: Urology;  Laterality: Left;  . EXPLORATORY LAPAROTOMY/ RESECTION MID TO DISTAL SIGMOID AND PROXIMAL RECTUM/ END PROXIMAL SIGMOID COLOSTOMY  07-17-2006   PERFORATED DIVERTICULITIS WITH PERITONITIS  . gamma knife  05/17/2016   for trigeminal neuralgia, [facility], Dr Salomon Fick  . gamma knife  2019  . IR CV LINE INJECTION  10/27/2019  . PORTACATH PLACEMENT Left  11/28/2018   Procedure: INSERTION PORT-A-CATH WITH ULTRASOUND;  Surgeon: Excell Seltzer, MD;  Location: [facility];  Service: General;  Laterality: Left;  . RESECTION COLOSTOMY/ CLOSURE COLOSTOMY WITH COLOPROCTOSTOMY  02-20-2007  . TONSILLECTOMY  AS CHILD  . URETEROSCOPY  07/04/2012   Procedure: URETEROSCOPY;  Surgeon: Claybon Jabs, MD;  Location: [facility];  Service: Urology;  Laterality: Left;  Marland Kitchen VAGINAL HYSTERECTOMY  1998   Partial  . WISDOM TOOTH EXTRACTION      There were no vitals filed for this visit.  Subjective Assessment - 01/18/20 1619    Subjective  "Today is a good day" - denies pain today. Pt brought her TENS unit with her for instruction today.    Diagnostic tests  Lumbar x-ray 12/15/19: She has scoliosis with diffuse degenerative changes and facet arthropathy.    Currently in Pain?  No/denies    Pain Onset  More than a month ago                       [facility] Adult PT Treatment/Exercise - 01/18/20 1615      Self-Care   Self-Care  Posture    Posture  Provided education in proper posture and body mechanics for mobility and positioning with typical daily tasks around home and while working.      Exercises   Exercises  Lumbar      Lumbar Exercises: Aerobic   Nustep  L3 x 6 min (UE/LE)      Lumbar Exercises: Supine   Pelvic Tilt  10 reps;5 seconds    Clam  10 reps;3 seconds   2 sets   Clam Limitations  2nd set with looped yellow TB at knees    Bent Knee Raise  10 reps;2 seconds    Bent Knee Raise Limitations  brace marching             PT Education - 01/18/20 1700    Education Details  Posture and body mechanics education for typical daily activities; home TENS unit set-up and programming; HEP update - lumbopelvic strengthening    Person(s) Educated  Patient    Methods  Explanation;Demonstration;Handout    Comprehension  Verbalized understanding;Returned demonstration;Need further instruction       PT Short Term Goals -  01/18/20 1621      PT SHORT TERM GOAL #1   Title  Patient will be independent with initial HEP    Status  Achieved   01/18/20     PT SHORT TERM GOAL #2   Title  Patient will verbalize/demonstrate good awareness of neutral spine posture and proper body mechanics for daily tasks    Status  Partially Met   01/18/20 - Pt recalling information about desk/work station positioning but less aware of posture and body mechanics with mobility and tasks around home.   Target Date  01/19/20  PT Long Term Goals - 01/07/20 1629      PT LONG TERM GOAL #1   Title  Patient will be independent with ongoing/advanced HEP    Status  On-going      PT LONG TERM GOAL #2   Title  Patient to demonstrate ability to achieve and maintain good spinal alignment/posturing    Status  On-going      PT LONG TERM GOAL #3   Title  Patient to improve lumbar AROM to [employer] without L LE pain provocation    Status  On-going      PT LONG TERM GOAL #4   Title  Patient will demonstrate improved B LE strength to >/= 4 to 4+/5 for improved stability and ease of mobility    Status  On-going      PT LONG TERM GOAL #5   Title  Patient to report ability to perform ADLs, household and work-related tasks without increased L LE pain    Status  On-going      PT LONG TERM GOAL #6   Title  Patient will report no sleep disturbance due to L LE pain    Status  On-going            Plan - 01/18/20 1638    Clinical Impression Statement  Taci arriving to PT denying pain today. She initially reported selective performance of HEP, but when HEP reviewed to determine which exercises, she was avoiding and why, pt reporting performance of all assigned HEP exercises without issue - STG #1 met. She also was unable to recall education on posture and body mechanics other than as it related to desk/workstation set-up, therefore provided handout and reviewed proper posture and body mechanics for typical daily tasks, mobility and  positioning. Introduced lumbopelvic strengthening with good tolerance and no increased pain reported, therefore home instructions provided for new exercises. Session concluded with further training in set-up and programming of home TENS unit purchased by patient.    Personal Factors and Comorbidities  Comorbidity 3+;Past/Current Experience;Time since onset of injury/illness/exacerbation    Comorbidities  h/o fibromyalgia, R hip trochanteric bursitis, breast cancer s/p R lumpectomy, peripheral neuropathy d/t chemo, B LE edema, muscle cramps, tremor, COVID-19    Examination-Activity Limitations  Bathing;Bed Mobility;Bend;Dressing;Hygiene/Grooming;Locomotion Level;Sit;Sleep;Squat;Stairs;Stand;Toileting;Transfers    Examination-Participation Restrictions  Cleaning;Community Activity;Driving;Laundry;Meal Prep;Shop    Rehab Potential  Good    PT Frequency  2x / week    PT Duration  6 weeks    PT Treatment/Interventions  ADLs/Self Care Home Management;Cryotherapy;Electrical Stimulation;Iontophoresis 64m/ml Dexamethasone;Moist Heat;DME Instruction;Gait training;Stair training;Functional mobility training;Therapeutic activities;Therapeutic exercise;Balance training;Neuromuscular re-education;Patient/family education;Manual techniques;Passive range of motion;Dry needling;Taping;Spinal Manipulations;Joint Manipulations    PT Next Visit Plan  L LE flexibility; lumbopelvic and L LE strengthening; manual therapy as indicated; modalities PRN; review of posture & body mechanics education PRN    PT Home Exercise Plan  12/29/19 - SKTC, HS, mod thomas hip flexor, figure 4 and/or KTOS piriformis stretches; 01/18/20 - pelvic tilt, brace marching, yellow TB hooklying clam    Consulted and Agree with Plan of Care  Patient       Patient will benefit from skilled therapeutic intervention in order to improve the following deficits and impairments:  Abnormal gait, Decreased activity tolerance, Decreased balance, Decreased  endurance, Decreased knowledge of precautions, Decreased mobility, Decreased range of motion, Decreased safety awareness, Decreased strength, Difficulty walking, Increased fascial restricitons, Increased muscle spasms, Impaired perceived functional ability, Impaired flexibility, Impaired sensation, Improper body mechanics, Postural dysfunction, Pain  Visit Diagnosis:  Pain in left leg  Other symptoms and signs involving the musculoskeletal system  Other muscle spasm  Stiffness of left hip, not elsewhere classified  Muscle weakness (generalized)     Problem List Patient Active Problem List   Diagnosis Date Noted  . COVID-19 11/06/2019  . Muscle cramps 05/12/2019  . Cramps, muscle, general 04/23/2019  . Tinnitus 04/16/2019  . Chest pain of uncertain etiology 32/54/9826  . Bilateral leg edema 02/10/2019  . Port-A-Cath in place 01/30/2019  . Anemia, iron deficiency 11/21/2018  . Malignant neoplasm of upper-outer quadrant of right breast in female, estrogen receptor positive ([place]) 11/17/2018  . Itching 10/13/2018  . Left eye pain 07/01/2018  . Constipation 07/01/2018  . Weight loss 07/22/2017  . Memory loss 12/21/2016  . Tremor 12/21/2016  . Right sided temporal headache 12/21/2016  . Trochanteric bursitis, right hip 10/26/2016  . Pruritus 10/26/2016  . Foot sprain, left, initial encounter 08/31/2016  . Pain of right side of body 08/31/2016  . Headache 04/24/2016  . LLQ pain 04/24/2016  . Glossitis 02/02/2016  . Routine general medical examination at a health care facility 10/27/2015  . Facial pain 10/06/2015  . Trigeminal neuralgia of right side of face 11/30/2010  . Morbid obesity ([place]) 11/09/2010  . Fibromyalgia 11/09/2010  . HEMATURIA, HX OF 11/14/2009  . LOC OSTEOARTHROS NOT SPEC PRIM/SEC OTH [facility] SITE 06/27/2009  . Allergic rhinitis 08/26/2008  . 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  [place], [place], [address] Phone: [phone]   Fax:  [phone]  Name: Laurianne Floresca MRN: [id number] Date of Birth: 03-05-1951   PHYSICAL THERAPY DISCHARGE SUMMARY  Visits from Start of Care: 4  Current functional level related to goals / functional outcomes:   Refer to above clinical impression for status as of last visit on 01/18/2020. As of 01/19/2020, patient cancelled all remaining appointments due to personal issues and has not scheduled any further appointments in >30 days, therefore will proceed with discharge from PT for this episode.   Remaining deficits:   As above. Unable to formally assess status as of discharge due to failure to return.   Education / Equipment:   HEP  Plan: Patient agrees to discharge.  Patient goals were partially met. Patient is being discharged due to not returning since the last visit.  ?????     Percival Spanish, PT, MPT 02/24/20, 10:03 AM  [facility] [address]  [place], [place], [address] Phone: [phone]   Fax:  [phone]

## 2020-01-18 NOTE — Patient Instructions (Addendum)
  Home exercise program created by Kinsleigh Ludolph, PT.  For questions, please contact Daijha Leggio via phone at [phone] or email at [email or web address]  [facility] [address]  [address] [place], [place], [address] Phone: [phone]   Fax:  [phone]     Sleeping on Back  Place pillow under knees. A pillow with cervical support and a roll around waist are also helpful. Copyright  VHI. All rights reserved.  Sleeping on Side Place pillow between knees. Use cervical support under neck and a roll around waist as needed. Copyright  VHI. All rights reserved.   Sleeping on Stomach   If this is the only desirable sleeping position, place pillow under lower legs, and under stomach or chest as needed.  Posture - Sitting   Sit upright, head facing forward. Try using a roll to support lower back. Keep shoulders relaxed, and avoid rounded back. Keep hips level with knees. Avoid crossing legs for long periods. Stand to Sit / Sit to Stand   To sit: Bend knees to lower self onto front edge of chair, then scoot back on seat. To stand: Reverse sequence by placing one foot forward, and scoot to front of seat. Use rocking motion to stand up.   Work Height and Reach  Ideal work height is no more than 2 to 4 inches below elbow level when standing, and at elbow level when sitting. Reaching should be limited to arm's length, with elbows slightly bent.  Bending  Bend at hips and knees, not back. Keep feet shoulder-width apart.    Posture - Standing   Good posture is important. Avoid slouching and forward head thrust. Maintain curve in low back and align ears over shoul- ders, hips over ankles.  Alternating Positions   Alternate tasks and change positions frequently to reduce fatigue and muscle tension. Take rest breaks. Computer Work   Position work to face forward. Use proper work and seat height. Keep shoulders back  and down, wrists straight, and elbows at right angles. Use chair that provides full back support. Add footrest and lumbar roll as needed.  Getting Into / Out of Car  Lower self onto seat, scoot back, then bring in one leg at a time. Reverse sequence to get out.  Dressing  Lie on back to pull socks or slacks over feet, or sit and bend leg while keeping back straight.    Housework - Sink  Place one foot on ledge of cabinet under sink when standing at sink for prolonged periods.   Pushing / Pulling  Pushing is preferable to pulling. Keep back in proper alignment, and use leg muscles to do the work.  Deep Squat   Squat and lift with both arms held against upper trunk. Tighten stomach muscles without holding breath. Use smooth movements to avoid jerking.  Avoid Twisting   Avoid twisting or bending back. Pivot around using foot movements, and bend at knees if needed when reaching for articles.  Carrying Luggage   Distribute weight evenly on both sides. Use a cart whenever possible. Do not twist trunk. Move body as a unit.   Lifting Principles .Maintain proper posture and head alignment. .Slide object as close as possible before lifting. .Move obstacles out of the way. .Test before lifting; ask for help if too heavy. .Tighten stomach muscles without holding breath. .Use smooth movements; do not jerk. .Use legs to do the work, and pivot with feet. .Distribute the work load symmetrically and   close to the center of trunk. .Push instead of pull whenever possible.   Ask For Help   Ask for help and delegate to others when possible. Coordinate your movements when lifting together, and maintain the low back curve.  Log Roll   Lying on back, bend left knee and place left arm across chest. Roll all in one movement to the right. Reverse to roll to the left. Always move as one unit. Housework - Sweeping  Use long-handled equipment to avoid stooping.   Housework -  Wiping  Position yourself as close as possible to reach work surface. Avoid straining your back.  Laundry - Unloading Wash   To unload small items at bottom of washer, lift leg opposite to arm being used to reach.  Gardening - Raking  Move close to area to be raked. Use arm movements to do the work. Keep back straight and avoid twisting.     Cart  When reaching into cart with one arm, lift opposite leg to keep back straight.   Getting Into / Out of Bed  Lower self to lie down on one side by raising legs and lowering head at the same time. Use arms to assist moving without twisting. Bend both knees to roll onto back if desired. To sit up, start from lying on side, and use same move-ments in reverse. Housework - Vacuuming  Hold the vacuum with arm held at side. Step back and forth to move it, keeping head up. Avoid twisting.   Laundry - Loading Wash  Position laundry basket so that bending and twisting can be avoided.   Laundry - Unloading Dryer  Squat down to reach into clothes dryer or use a reacher.  Gardening - Weeding / Planting  Squat or Kneel. Knee pads may be helpful.                    

## 2020-01-18 NOTE — Progress Notes (Signed)
Pharmacist Chemotherapy Monitoring - Follow Up Assessment    I verify that I have reviewed each item in the below checklist:  . Regimen for the patient is scheduled for the appropriate day and plan matches scheduled date. Marland Kitchen Appropriate non-routine labs are ordered dependent on drug ordered. . If applicable, additional medications reviewed and ordered per protocol based on lifetime cumulative doses and/or treatment regimen.   Plan for follow-up and/or issues identified: No . I-vent associated with next due treatment: No . MD and/or nursing notified: No  Marnie Fazzino D 01/18/2020 1:59 PM

## 2020-01-20 ENCOUNTER — Ambulatory Visit: Payer: Federal, State, Local not specified - PPO

## 2020-01-21 ENCOUNTER — Encounter: Payer: Self-pay | Admitting: *Deleted

## 2020-01-21 NOTE — Progress Notes (Signed)
[place]   [phone] [phone]   Clinic Follow up Note   Patient Care Team: Hoyt Koch, MD as PCP - General (Internal Medicine) Sueanne Margarita, MD as PCP - Cardiology (Cardiology) Mauro Kaufmann, RN as Oncology Nurse Navigator Rockwell Germany, RN as Oncology Nurse Navigator Excell Seltzer, MD (Inactive) as Consulting Physician (General Surgery) Truitt Merle, MD as Consulting Physician (Hematology) Gery Pray, MD as Consulting Physician (Radiation Oncology)  Date of Service:  01/22/2020  CHIEF COMPLAINT: F/u of right breast cancer  SUMMARY OF ONCOLOGIC HISTORY: Oncology History Overview Note  Cancer Staging Malignant neoplasm of upper-outer quadrant of right breast in female, estrogen receptor positive ([place]) Staging form: Breast, AJCC 8th Edition - Clinical stage from 11/10/2018: Stage IA (cT1b, cN0, cM0, G2, ER+, PR+, HER2+) - Signed by Truitt Merle, MD on 11/18/2018     Malignant neoplasm of upper-outer quadrant of right breast in female, estrogen receptor positive ([place])  11/07/2018 Mammogram   Diagnostic 11/07/18 IMPRESSION: 1. 1 x 0.7 x 0.6 cm hypoechoic mass with distortion at the 9-9:30 position of the RIGHT breast 6 cm from the nipple with distortion in the UPPER-OUTER RIGHT breast, corresponding to the mammographic abnormality. One RIGHT axillary lymph node with borderline cortical thickness. Tissue sampling of the RIGHT breast mass and borderline RIGHT axillary lymph node recommended. ADDENDUM: Patient returned today for biopsy of a single borderline thickened lymph node in the RIGHT axilla. I could not reproduce a morphologically abnormal lymph node today, possibly resolved in the interval. Today, only normal appearing lymph nodes were identified in the RIGHT axilla. As such, the axillary ultrasound-guided biopsy was canceled.   11/10/2018 Cancer Staging   Staging form: Breast, AJCC 8th Edition - Clinical stage  from 11/10/2018: Stage IA (cT1b, cN0, cM0, G2, ER+, PR+, HER2+) - Signed by Truitt Merle, MD on 11/18/2018   11/11/2018 Initial Biopsy   Diagnosis 11/11/18 Breast, right, needle core biopsy, upper outer - 9:30 o'clock position - INVASIVE DUCTAL CARCINOMA, GRADE II/III. - SEE MICROSCOPIC DESCRIPTION.   11/11/2018 Receptors her2   Results: IMMUNOHISTOCHEMICAL AND MORPHOMETRIC ANALYSIS PERFORMED MANUALLY The tumor cells are POSITIVE for Her2 (3+). Estrogen Receptor: 95%, POSITIVE, STRONG STAINING INTENSITY Progesterone Receptor: 5%, POSITIVE, STRONG STAINING INTENSITY Proliferation Marker Ki67: 20%   11/17/2018 Initial Diagnosis   Malignant neoplasm of upper-outer quadrant of right breast in female, estrogen receptor positive ([place])   11/28/2018 Surgery   RIGHT BREAST LUMPECTOMY WITH RADIOACTIVE SEED AND RIGHT AXILLARY SENTINEL LYMPH NODE BIOPSY by Dr. Excell Seltzer  11/28/18    11/28/2018 Pathology Results   Diagnosis 11/28/18  1. Breast, lumpectomy, Right w/seed - INVASIVE DUCTAL CARCINOMA, 1.6 CM, NOTTINGHAM GRADE 2 OF 3. - MARGINS OF RESECTION ARE NOT INVOLVED (CLOSEST MARGIN: LESS THAN 1 MM, ANTERIOR). - DUCTAL CARCINOMA IN SITU. - BIOPSY SITE CHANGES. - SEE ONCOLOGY TABLE. 2. Lymph node, sentinel, biopsy, right Axillary - ONE LYMPH NODE, NEGATIVE FOR CARCINOMA (0/1). 3. Lymph node, sentinel, biopsy, right Axillary - ONE LYMPH NODE, NEGATIVE FOR CARCINOMA (0/1).   11/28/2018 Cancer Staging   Staging form: Breast, AJCC 8th Edition - Pathologic stage from 11/28/2018: Stage IA (pT1c, pN0, cM0, G2, ER+, PR+, HER2+) - Signed by Truitt Merle, MD on 12/11/2018   12/22/2018 Procedure   Baseline ECHO 12/22/18  IMPRESSIONS  1. The left ventricle has normal systolic function with an ejection fraction of 60-65%. The cavity size was normal. There is mild concentric left ventricular hypertrophy. Left ventricular diastolic parameters were normal.  2. The right ventricle has normal systolic function. The cavity was  normal. There is no increase in right ventricular wall thickness.  3. Left atrial size was moderately dilated.  4. The aortic valve is tricuspid. Aortic valve regurgitation was not assessed by color flow Doppler.    01/02/2019 - 02/12/2020 Chemotherapy   Adjuvant chemo weekly Taxol with Herceptin starting 01/02/19. Will d/c after 02/13/19 and will switch to Kadcyla q3weeks starting 02/20/19 due to worsening neuropathy. Due to worsening Muscle cramps Kadcyla stopped 04/03/19 and switched to Herceptin q3weeks on 04/23/19. Will complete on 02/12/20   04/09/2019 - 05/28/2019 Radiation Therapy   RT with Dr. Sondra Come 04/09/19-05/28/19   06/29/2019 -  Anti-estrogen oral therapy   Exemestane 77m daily starting 06/29/19      CURRENT THERAPY:  -Adjuvant chemo weekly Taxol with Herceptin starting 01/02/19. Will d/c after 02/13/19 and will switch to Kadcyla q3weeks starting 6/12/20due to worsening neuropathy. Due to worsening Muscle cramps I switched Kadcyla to Herceptin on 04/23/19. Will complete on 02/12/20.  -IV iron as needed -Exemestane 275mdaily starting 06/29/19  INTERVAL HISTORY:  [name] here for a follow up and treatment. She presents to the clinic alone. She notes her neuropathy has progressed, especially in her left foot with one of her left 2nd digit laterally rotated. She notes only her toes get numbness and her right foot occasionally is painful as well. She notes her neuropathy started to progress after Taxol. She notes pain in her upper leg radiates down to her toes which she notes is the cause of her neuropathy. She notes she was last seen by ortho Dr HiLemar Livingsho took Xrays of her lower back and LE. 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 She notes she did PT and used TENS Unit device which helps her pain.  She notes she is tolerating Exemestane well.     REVIEW OF SYSTEMS: Constitutional: Denies fevers, chills or abnormal weight loss Eyes: Denies blurriness  of vision Ears, nose, mouth, throat, and face: Denies mucositis or sore throat Respiratory: Denies cough, dyspnea or wheezes Cardiovascular: Denies palpitation, chest discomfort or lower extremity swelling Gastrointestinal:  Denies nausea, heartburn or change in bowel habits Skin: Denies abnormal skin rashes MSK: (+) Left leg pain  Lymphatics: Denies new lymphadenopathy or easy bruising Neurological: (+) Neuropathy mainly with Tingling in her left foot and numbness of toes (L>R).  Behavioral/Psych: Mood is stable, no new changes  All other systems were reviewed with the patient and are negative.  MEDICAL HISTORY:  Past Medical History:  Diagnosis Date  . Abdominal pain, left lower quadrant 11/09/2009   Qualifier: Diagnosis of  By: NeValma CavaPN, NaIzora Gala  . Allergic rhinitis 08/26/2008   Qualifier: Diagnosis of  By: ToSherren MochaD, JeJory Ee . Anemia, iron deficiency 11/21/2018  . Arthritis shoulders and back  . BACK PAIN, CHRONIC 06/13/2007   Qualifier: Diagnosis of  By: ToSherren MochaD, JeJory Ee . 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 Constipation 07/01/2018  . Costochondritis 05/04/2014  . Diverticulitis of colon 06/13/2007   S/p segmental colectomy for perforated diverticulitis   . Diverticulosis   . Facial pain 10/06/2015  . Fibromyalgia   . Foot sprain, left, initial encounter 08/31/2016  . Glossitis 02/02/2016  . Headache 04/24/2016  . HEMATURIA UNSPECIFIED 06/25/2008   Qualifier: Diagnosis of  By: ToSherren MochaD, JeJory Ee .  HEMATURIA, HX OF 11/14/2009   Qualifier: Diagnosis of  By: Regino Schultze CMA (AAMA), Apolonio Schneiders    . History of kidney stones   . History of kidney stones   . History of shingles 2012--  no residual pain  . Itching 10/13/2018  . Left eye pain 07/01/2018  . Left ureteral calculus   . LLQ pain 04/24/2016  . LOC OSTEOARTHROS NOT SPEC PRIM/SEC OTH [facility] 06/27/2009   Qualifier: Diagnosis of  By: Sherren Mocha MD, Jory Ee   . Malignant  neoplasm of upper-outer quadrant of right breast in female, estrogen receptor positive ([place]) 11/17/2018  . MCI (mild cognitive impairment)   . Memory loss 12/21/2016  . Morbid obesity ([place]) 11/09/2010  . Pain of right side of body 08/31/2016  . Personal history of chemotherapy   . Personal history of radiation therapy   . Pruritus 10/26/2016  . Renal disorder   . Right sided temporal headache 12/21/2016  . Tremor 12/21/2016  . Trigeminal neuralgia RIGHT  . Trigeminal neuralgia of right side of face 11/30/2010  . Trochanteric bursitis, right hip 10/26/2016  . Urgency of urination   . Weight loss 07/22/2017  . Zoster without complications 8/34/1962    SURGICAL HISTORY: Past Surgical History:  Procedure Laterality Date  . BREAST BIOPSY Right 11/11/2018  . BREAST LUMPECTOMY Right 11/28/2018  . BREAST LUMPECTOMY WITH RADIOACTIVE SEED AND SENTINEL LYMPH NODE BIOPSY Right 11/28/2018   Procedure: RIGHT BREAST LUMPECTOMY WITH RADIOACTIVE SEED AND RIGHT AXILLARY SENTINEL LYMPH NODE BIOPSY;  Surgeon: Excell Seltzer, MD;  Location: [place];  Service: General;  Laterality: Right;  . CEREBRAL MICROVASCULAR DECOMPRESSION  07-05-2006   RIGHT TRIGEMINAL NERVE  . CYSTOSCOPY/RETROGRADE/URETEROSCOPY/STONE EXTRACTION WITH BASKET  07/04/2012   Procedure: CYSTOSCOPY/RETROGRADE/URETEROSCOPY/STONE EXTRACTION WITH BASKET;  Surgeon: Claybon Jabs, MD;  Location: [facility];  Service: Urology;  Laterality: Left;  . EXPLORATORY LAPAROTOMY/ RESECTION MID TO DISTAL SIGMOID AND PROXIMAL RECTUM/ END PROXIMAL SIGMOID COLOSTOMY  07-17-2006   PERFORATED DIVERTICULITIS WITH PERITONITIS  . gamma knife  05/17/2016   for trigeminal neuralgia, [facility], Dr Salomon Fick  . gamma knife  2019  . IR CV LINE INJECTION  10/27/2019  . PORTACATH PLACEMENT Left 11/28/2018   Procedure: INSERTION PORT-A-CATH WITH ULTRASOUND;  Surgeon: Excell Seltzer, MD;  Location: [place];  Service: General;  Laterality: Left;  . RESECTION  COLOSTOMY/ CLOSURE COLOSTOMY WITH COLOPROCTOSTOMY  02-20-2007  . TONSILLECTOMY  AS CHILD  . URETEROSCOPY  07/04/2012   Procedure: URETEROSCOPY;  Surgeon: Claybon Jabs, MD;  [facility];  Service: Urology;  Laterality: Left;  Marland Kitchen VAGINAL HYSTERECTOMY  1998   Partial  . WISDOM TOOTH EXTRACTION      I have reviewed the social history and family history with the patient and they are unchanged from previous note.  ALLERGIES:  is allergic to clindamycin hcl.  MEDICATIONS:  Current Outpatient Medications  Medication Sig Dispense Refill  . acetaminophen (TYLENOL) 500 MG tablet Take 1,000 mg by mouth every 6 (six) hours as needed.    . baclofen (LIORESAL) 10 MG tablet Take 0.5-1 tablets (5-10 mg total) by mouth 3 (three) times daily as needed for muscle spasms. 30 each 3  . diclofenac Sodium (VOLTAREN) 1 % GEL Apply 2 g topically 4 (four) times daily. 100 g 5  . DULoxetine (CYMBALTA) 60 MG capsule TAKE 1 CAPSULE BY MOUTH EVERY DAY (Patient taking differently: Take 60 mg by mouth daily. ) 90 capsule 3  . exemestane (AROMASIN) 25 MG tablet  Take 1 tablet (25 mg total) by mouth daily after breakfast. 90 tablet 1  . gabapentin (NEURONTIN) 600 MG tablet TAKE 2 TABLETS TID. 540 tablet 3  . predniSONE (DELTASONE) 50 MG tablet 1 p.o. daily x5 5 tablet 0  . tiZANidine (ZANAFLEX) 2 MG tablet TAKE 1-2 TABLETS (2-4 MG TOTAL) BY MOUTH EVERY 6 (SIX) HOURS AS NEEDED FOR MUSCLE SPASMS. 60 tablet 1   No current facility-administered medications for this visit.   Facility-Administered Medications Ordered in Other Visits  Medication Dose Route Frequency Provider Last Rate Last Admin  . acetaminophen (TYLENOL) tablet 650 mg  650 mg Oral Once Truitt Merle, MD      . diphenhydrAMINE (BENADRYL) capsule 50 mg  50 mg Oral Once Truitt Merle, MD      . sodium chloride flush (NS) 0.9 % injection 10 mL  10 mL Intracatheter PRN Truitt Merle, MD   10 mL at 01/22/20 1107    PHYSICAL EXAMINATION: ECOG  PERFORMANCE STATUS: 1 - Symptomatic but completely ambulatory  Vitals:   01/22/20 0900  BP: (!) 147/65  Pulse: 66  Resp: 18  Temp: 98.2 F (36.8 C)  SpO2: 100%   Filed Weights   01/22/20 0900  Weight: 185 lb 6.4 oz (84.1 kg)    Due to COVID19 we will limit examination to appearance. Patient had no complaints.  GENERAL:alert, no distress and comfortable SKIN: skin color normal, no rashes or significant lesions EYES: normal, Conjunctiva are pink and non-injected, sclera clear  NEURO: alert & oriented x 3 with fluent speech   LABORATORY DATA:  I have reviewed the data as listed CBC Latest Ref Rng & Units 01/22/2020 11/20/2019 11/02/2019  WBC 4.0 - 10.5 K/uL 4.6 4.8 5.6  Hemoglobin 12.0 - 15.0 g/dL 12.1 12.7 12.1  Hematocrit 36.0 - 46.0 % 37.7 40.2 38.4  Platelets 150 - 400 K/uL 230 227 229     CMP Latest Ref Rng & Units 01/22/2020 11/20/2019 11/02/2019  Glucose 70 - 99 mg/dL 93 126(H) 94  BUN 8 - 23 mg/dL 10 14 11   Creatinine 0.44 - 1.00 mg/dL 0.70 0.74 0.58  Sodium 135 - 145 mmol/L 143 139 143  Potassium 3.5 - 5.1 mmol/L 3.7 3.9 2.9(L)  Chloride 98 - 111 mmol/L 106 105 103  CO2 22 - 32 mmol/L 29 27 30   Calcium 8.9 - 10.3 mg/dL 9.3 8.8(L) 9.1  Total Protein 6.5 - 8.1 g/dL 6.4(L) 6.7 -  Total Bilirubin 0.3 - 1.2 mg/dL 1.1 0.7 -  Alkaline Phos 38 - 126 U/L 109 108 -  AST 15 - 41 U/L 20 19 -  ALT 0 - 44 U/L 14 9 -      RADIOGRAPHIC STUDIES: I have personally reviewed the radiological images as listed and agreed with the findings in the report. No results found.   ASSESSMENT & PLAN:  Summer [name] is a 69 y.o. female with    1.Malignant neoplasm of upper-outer quadrant of right breast, StageIA,pT1b,N0,M0, ER/PR+, HER2+, GradeII -She was diagnosed in 11/2018. She is s/p right breastlumpectomyand SLNB and adjuvant radiation.  -She startedadjuvantweekly Taxol with Herceptinon4/24/20butstopped after 6 weeks therapydue to worsening neuropathyand  infusion reaction. She also tried Kadcyla but due to worsening muscle cramps she was changed to Herceptin infusionq3weeks starting 04/23/19. If she tolerates we will continueanti-her2 treatmentuntil4/2021. -I started her on antiestrogen therapy with Exemestane in 06/2019. She has tolerated moderately due to known joint pain worsening in shoulders and knees. She is willing to continue for now. -She  remains stable. She notes her recent progression in Neuropathy is related to spinal issues. She will continue to f/u with Specialist about this.  -Labs reviewed, and adequate to proceed with Herceptin today. I calculated all her anti-HER2 therapies together and her final Herceptin will be next cycle in 3 weeks.  -She is fine to have her PAC removed after she completes Herceptin. If not done soon with continue Port flushes every 6-8 weeks. She is agreeable with removal.  -I discussed given her small tumor I do not recommend adjuvant anti-HER2 Nerlynx due to lower benefit.  -Continue Exemestane -f/u in 3 months with survivorship clinic.    2. Significant Muscle Cramps, Diffuse body pain, joint pain/arthritis  possible related to chemo, improved lately  -She has been seen by her neurologist Dr Krista Blue who increased herGabapentinto 677m 7 tabs a day. She was to have nerve study in 07/2019 -She does have arthritis progression due to Exemestane with b/l knee and shoulder pain. Ipreviouslyrecommend she start OTC Glucosamine and increase exercise to help her pain.   3. Anemia,iron deficiency -Last colonoscopy was in 04/2012 which wasunremarkable except diverticulosis.  -She had partial colectomy in the past due to severe diverticulosis. -This is possiblyiron deficiency from diverticulosis.She denies any current bleeding.  -Her workupin early 2020showed iron 17, ferritin 4, normal retic ct, folate Vitamin B12 and SPEP panels. -She received iv iron and anemia has resolved -Dr. GCarlean Purlis open to  have Colonoscopy for further evaluation. I recommend she proceed. -IV iron was given 3/18/20and 12/12/18. Anemia Has been resolved lately, will continue to check her iron level  4. Trigeminal neuralgia  -5/26/20brain and neck MRIwere stable. -She has been having moderate right side HA which I suspect is exacerbation of her neuralgia secondary to chemo. -Has not been completely controlled but managing with occasionally worsened flares.  -She has oxycodone which she can use for significant pain. Otherwise she is on Gabapentin 6060m7 tabs a day.  5.PeripheralNeuropathy, G1-2, Left leg pain  -Secondary to chemoTaxolstarted aftercycle 3. -Taxol switched toTDM-1 KaDemetrius Charity/12/20which was switched to Herceptin on 04/23/19 -On Robaxin, B12. High dose Gabapentin dose not help.  -Her neuropathycontinues to slowly progress with Tingling in her left foot and numbness of toes (L>R).  -She attributes her progression to left hip pain that radiates to her toes. 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  -She has gone to PT and with TENS Unit device she can get some relief.  -She will continue to f/u with specialists about this.   6. Osteopenia  -Her baseline DEXA from 07/2019 shows osteopenia with lowest T-score -1.1 at left hip.  -Will continue to monitor on AI as this can weaken her bones. Repeat every 2 years.  -I recommend she start OTC calcium and Vitamin D.  7. COVID19 (+)   -She has recently had cough with clear thick phlegm. She notes being significantly fatigued for the past 2 weeks. She denies fever.   -She also has been having mid chest palpitations. She has not been formally seen by Dr TuRadford Paxet. -She tested positive for COVID19 on 10/29/19 through [facility]. She was quarantined at home and did not require treatment. She did have ED visit and workout indicated b/l Pneumonia.     PLAN: -Labs reviewed and adequate to  proceed with Herceptin today  -Continue exemestane -[facility]  -Lab, flush and last Herceptin in 3  weeks  -Lab and Survivorship clinic in 3 months with NP Lacie    No problem-specific Assessment & Plan notes found for this encounter.   Orders Placed This Encounter  Procedures  . Vitamin D 25 hydroxy    Standing Status:   Standing    Number of Occurrences:   10    Standing Expiration Date:   01/21/2025   All questions were answered. The patient knows to call the clinic with any problems, questions or concerns. No barriers to learning was detected. The total time spent in the appointment was 30 minutes.     Truitt Merle, MD 01/22/2020   I, Joslyn Devon, am acting as scribe for Truitt Merle, MD.   I have reviewed the above documentation for accuracy and completeness, and I agree with the above.

## 2020-01-22 ENCOUNTER — Inpatient Hospital Stay (HOSPITAL_BASED_OUTPATIENT_CLINIC_OR_DEPARTMENT_OTHER): Payer: Federal, State, Local not specified - PPO | Admitting: Hematology

## 2020-01-22 ENCOUNTER — Other Ambulatory Visit: Payer: Self-pay

## 2020-01-22 ENCOUNTER — Inpatient Hospital Stay: Payer: Federal, State, Local not specified - PPO | Attending: Hematology

## 2020-01-22 ENCOUNTER — Encounter: Payer: Self-pay | Admitting: Hematology

## 2020-01-22 ENCOUNTER — Inpatient Hospital Stay: Payer: Federal, State, Local not specified - PPO

## 2020-01-22 VITALS — BP 147/65 | HR 66 | Temp 98.2°F | Resp 18 | Ht 66.0 in | Wt 185.4 lb

## 2020-01-22 DIAGNOSIS — G500 Trigeminal neuralgia: Secondary | ICD-10-CM | POA: Diagnosis not present

## 2020-01-22 DIAGNOSIS — M8580 Other specified disorders of bone density and structure, unspecified site: Secondary | ICD-10-CM | POA: Diagnosis not present

## 2020-01-22 DIAGNOSIS — R252 Cramp and spasm: Secondary | ICD-10-CM | POA: Insufficient documentation

## 2020-01-22 DIAGNOSIS — M797 Fibromyalgia: Secondary | ICD-10-CM | POA: Diagnosis not present

## 2020-01-22 DIAGNOSIS — Z79899 Other long term (current) drug therapy: Secondary | ICD-10-CM | POA: Insufficient documentation

## 2020-01-22 DIAGNOSIS — M25519 Pain in unspecified shoulder: Secondary | ICD-10-CM | POA: Diagnosis not present

## 2020-01-22 DIAGNOSIS — Z170 Estrogen receptor positive status [ER+]: Secondary | ICD-10-CM | POA: Diagnosis not present

## 2020-01-22 DIAGNOSIS — D509 Iron deficiency anemia, unspecified: Secondary | ICD-10-CM

## 2020-01-22 DIAGNOSIS — C50411 Malignant neoplasm of upper-outer quadrant of right female breast: Secondary | ICD-10-CM | POA: Diagnosis present

## 2020-01-22 DIAGNOSIS — M170 Bilateral primary osteoarthritis of knee: Secondary | ICD-10-CM | POA: Insufficient documentation

## 2020-01-22 DIAGNOSIS — M85852 Other specified disorders of bone density and structure, left thigh: Secondary | ICD-10-CM | POA: Insufficient documentation

## 2020-01-22 DIAGNOSIS — Z923 Personal history of irradiation: Secondary | ICD-10-CM | POA: Diagnosis not present

## 2020-01-22 DIAGNOSIS — Z791 Long term (current) use of non-steroidal anti-inflammatories (NSAID): Secondary | ICD-10-CM | POA: Insufficient documentation

## 2020-01-22 DIAGNOSIS — Z8616 Personal history of COVID-19: Secondary | ICD-10-CM | POA: Insufficient documentation

## 2020-01-22 DIAGNOSIS — G629 Polyneuropathy, unspecified: Secondary | ICD-10-CM | POA: Diagnosis not present

## 2020-01-22 DIAGNOSIS — Z79811 Long term (current) use of aromatase inhibitors: Secondary | ICD-10-CM | POA: Diagnosis not present

## 2020-01-22 DIAGNOSIS — Z9221 Personal history of antineoplastic chemotherapy: Secondary | ICD-10-CM | POA: Diagnosis not present

## 2020-01-22 DIAGNOSIS — Z95828 Presence of other vascular implants and grafts: Secondary | ICD-10-CM

## 2020-01-22 DIAGNOSIS — M25552 Pain in left hip: Secondary | ICD-10-CM | POA: Insufficient documentation

## 2020-01-22 LAB — CBC WITH DIFFERENTIAL/PLATELET
Abs Immature Granulocytes: 0.02 10*3/uL (ref 0.00–0.07)
Basophils Absolute: 0 10*3/uL (ref 0.0–0.1)
Basophils Relative: 1 %
Eosinophils Absolute: 0.1 10*3/uL (ref 0.0–0.5)
Eosinophils Relative: 3 %
HCT: 37.7 % (ref 36.0–46.0)
Hemoglobin: 12.1 g/dL (ref 12.0–15.0)
Immature Granulocytes: 0 %
Lymphocytes Relative: 29 %
Lymphs Abs: 1.3 10*3/uL (ref 0.7–4.0)
MCH: 29.4 pg (ref 26.0–34.0)
MCHC: 32.1 g/dL (ref 30.0–36.0)
MCV: 91.5 fL (ref 80.0–100.0)
Monocytes Absolute: 0.5 10*3/uL (ref 0.1–1.0)
Monocytes Relative: 10 %
Neutro Abs: 2.6 10*3/uL (ref 1.7–7.7)
Neutrophils Relative %: 57 %
Platelets: 230 10*3/uL (ref 150–400)
RBC: 4.12 MIL/uL (ref 3.87–5.11)
RDW: 13.5 % (ref 11.5–15.5)
WBC: 4.6 10*3/uL (ref 4.0–10.5)
nRBC: 0 % (ref 0.0–0.2)

## 2020-01-22 LAB — COMPREHENSIVE METABOLIC PANEL
ALT: 14 U/L (ref 0–44)
AST: 20 U/L (ref 15–41)
Albumin: 3.3 g/dL — ABNORMAL LOW (ref 3.5–5.0)
Alkaline Phosphatase: 109 U/L (ref 38–126)
Anion gap: 8 (ref 5–15)
BUN: 10 mg/dL (ref 8–23)
CO2: 29 mmol/L (ref 22–32)
Calcium: 9.3 mg/dL (ref 8.9–10.3)
Chloride: 106 mmol/L (ref 98–111)
Creatinine, Ser: 0.7 mg/dL (ref 0.44–1.00)
GFR calc Af Amer: >60 mL/min (ref >60)
GFR calc non Af Amer: >60 mL/min (ref >60)
Glucose, Bld: 93 mg/dL (ref 70–99)
Potassium: 3.7 mmol/L (ref 3.5–5.1)
Sodium: 143 mmol/L (ref 135–145)
Total Bilirubin: 1.1 mg/dL (ref 0.3–1.2)
Total Protein: 6.4 g/dL — ABNORMAL LOW (ref 6.5–8.1)

## 2020-01-22 LAB — IRON AND TIBC
Iron: 101 ug/dL (ref 41–142)
Saturation Ratios: 31 % (ref 21–57)
TIBC: 328 ug/dL (ref 236–444)
UIBC: 227 ug/dL (ref 120–384)

## 2020-01-22 LAB — FERRITIN: Ferritin: 39 ng/mL (ref 11–307)

## 2020-01-22 MED ORDER — SODIUM CHLORIDE 0.9% FLUSH
10.0000 mL | INTRAVENOUS | Status: DC | PRN
  Administered 2020-01-22: 10 mL
  Filled 2020-01-22: qty 10

## 2020-01-22 MED ORDER — ACETAMINOPHEN 325 MG PO TABS: 650.0000 mg | ORAL_TABLET | Freq: Once | ORAL | Status: DC

## 2020-01-22 MED ORDER — SODIUM CHLORIDE 0.9% FLUSH
10.0000 mL | Freq: Once | INTRAVENOUS | Status: AC
  Administered 2020-01-22: 10 mL
  Filled 2020-01-22: qty 10

## 2020-01-22 MED ORDER — SODIUM CHLORIDE 0.9 % IV SOLN
Freq: Once | INTRAVENOUS | Status: AC
  Filled 2020-01-22: qty 250

## 2020-01-22 MED ORDER — DIPHENHYDRAMINE HCL 25 MG PO CAPS: 50.0000 mg | ORAL_CAPSULE | Freq: Once | ORAL | Status: DC

## 2020-01-22 MED ORDER — EXEMESTANE 25 MG PO TABS: 25.0000 mg | ORAL_TABLET | Freq: Every day | ORAL | 1 refills | Status: DC

## 2020-01-22 MED ORDER — HEPARIN SOD (PORK) LOCK FLUSH 100 UNIT/ML IV SOLN
500.0000 [IU] | Freq: Once | INTRAVENOUS | Status: AC | PRN
  Administered 2020-01-22: 500 [IU]
  Filled 2020-01-22: qty 5

## 2020-01-22 MED ORDER — TRASTUZUMAB-DKST CHEMO 150 MG IV SOLR
6.0000 mg/kg | Freq: Once | INTRAVENOUS | Status: AC
  Administered 2020-01-22: 504 mg via INTRAVENOUS
  Filled 2020-01-22: qty 24

## 2020-01-22 NOTE — Patient Instructions (Signed)
[facility] Discharge Instructions for Patients Receiving Chemotherapy  Today you received the following chemotherapy agents trastuzumab.  To help prevent nausea and vomiting after your treatment, we encourage you to take your nausea medication as directed.    If you develop nausea and vomiting that is not controlled by your nausea medication, call the clinic.   BELOW ARE SYMPTOMS THAT SHOULD BE REPORTED IMMEDIATELY:  *FEVER GREATER THAN 100.5 F  *CHILLS WITH OR WITHOUT FEVER  NAUSEA AND VOMITING THAT IS NOT CONTROLLED WITH YOUR NAUSEA MEDICATION  *UNUSUAL SHORTNESS OF BREATH  *UNUSUAL BRUISING OR BLEEDING  TENDERNESS IN MOUTH AND THROAT WITH OR WITHOUT PRESENCE OF ULCERS  *URINARY PROBLEMS  *BOWEL PROBLEMS  UNUSUAL RASH Items with * indicate a potential emergency and should be followed up as soon as possible.  Feel free to call the clinic should you have any questions or concerns. The clinic phone number is [phone].  Please show the CHEMO ALERT CARD at check-in to the Emergency Department and triage nurse.   

## 2020-01-25 ENCOUNTER — Encounter: Payer: Self-pay | Admitting: Hematology

## 2020-01-25 ENCOUNTER — Telehealth: Payer: Self-pay | Admitting: Hematology

## 2020-01-25 NOTE — Telephone Encounter (Signed)
Scheduled appt per 5/14 los.  Left a vm of the appt date and time,

## 2020-01-28 ENCOUNTER — Encounter: Payer: Self-pay | Admitting: Hematology

## 2020-01-28 ENCOUNTER — Encounter: Payer: Federal, State, Local not specified - PPO | Admitting: Physical Therapy

## 2020-01-29 ENCOUNTER — Encounter: Payer: Self-pay | Admitting: Hematology

## 2020-02-04 ENCOUNTER — Encounter: Payer: Self-pay | Admitting: Family Medicine

## 2020-02-04 ENCOUNTER — Encounter: Payer: Federal, State, Local not specified - PPO | Admitting: Physical Therapy

## 2020-02-12 ENCOUNTER — Inpatient Hospital Stay: Payer: Federal, State, Local not specified - PPO | Attending: Hematology

## 2020-02-12 ENCOUNTER — Other Ambulatory Visit: Payer: Self-pay

## 2020-02-12 VITALS — BP 139/64 | HR 67 | Temp 98.5°F | Resp 18

## 2020-02-12 DIAGNOSIS — Z5112 Encounter for antineoplastic immunotherapy: Secondary | ICD-10-CM | POA: Diagnosis not present

## 2020-02-12 DIAGNOSIS — C50411 Malignant neoplasm of upper-outer quadrant of right female breast: Secondary | ICD-10-CM | POA: Insufficient documentation

## 2020-02-12 DIAGNOSIS — Z170 Estrogen receptor positive status [ER+]: Secondary | ICD-10-CM | POA: Diagnosis not present

## 2020-02-12 MED ORDER — TRASTUZUMAB-DKST CHEMO 150 MG IV SOLR
6.0000 mg/kg | Freq: Once | INTRAVENOUS | Status: AC
  Administered 2020-02-12: 504 mg via INTRAVENOUS
  Filled 2020-02-12: qty 24

## 2020-02-12 MED ORDER — SODIUM CHLORIDE 0.9% FLUSH
10.0000 mL | INTRAVENOUS | Status: DC | PRN
  Administered 2020-02-12: 10 mL
  Filled 2020-02-12: qty 10

## 2020-02-12 MED ORDER — ACETAMINOPHEN 325 MG PO TABS: 650.0000 mg | ORAL_TABLET | Freq: Once | ORAL | Status: DC

## 2020-02-12 MED ORDER — DIPHENHYDRAMINE HCL 25 MG PO CAPS: 50.0000 mg | ORAL_CAPSULE | Freq: Once | ORAL | Status: DC

## 2020-02-12 MED ORDER — HEPARIN SOD (PORK) LOCK FLUSH 100 UNIT/ML IV SOLN
500.0000 [IU] | Freq: Once | INTRAVENOUS | Status: AC | PRN
  Administered 2020-02-12: 500 [IU]
  Filled 2020-02-12: qty 5

## 2020-02-12 MED ORDER — SODIUM CHLORIDE 0.9 % IV SOLN
Freq: Once | INTRAVENOUS | Status: AC
  Filled 2020-02-12: qty 250

## 2020-02-12 NOTE — Progress Notes (Signed)
Per Dr. Burr Medico, [place] to use labs from 5/14 for today's treatment.

## 2020-02-12 NOTE — Patient Instructions (Signed)
[facility] °Discharge Instructions for Patients Receiving Chemotherapy ° °Today you received the following chemotherapy agents Trastuzumab ° °To help prevent nausea and vomiting after your treatment, we encourage you to take your nausea medication as directed. °  °If you develop nausea and vomiting that is not controlled by your nausea medication, call the clinic.  ° °BELOW ARE SYMPTOMS THAT SHOULD BE REPORTED IMMEDIATELY: °· *FEVER GREATER THAN 100.5 F °· *CHILLS WITH OR WITHOUT FEVER °· NAUSEA AND VOMITING THAT IS NOT CONTROLLED WITH YOUR NAUSEA MEDICATION °· *UNUSUAL SHORTNESS OF BREATH °· *UNUSUAL BRUISING OR BLEEDING °· TENDERNESS IN MOUTH AND THROAT WITH OR WITHOUT PRESENCE OF ULCERS °· *URINARY PROBLEMS °· *BOWEL PROBLEMS °· UNUSUAL RASH °Items with * indicate a potential emergency and should be followed up as soon as possible. ° °Feel free to call the clinic should you have any questions or concerns. The clinic phone number is [phone]. ° °Please show the CHEMO ALERT CARD at check-in to the Emergency Department and triage nurse. ° ° °

## 2020-02-16 ENCOUNTER — Encounter: Payer: Self-pay | Admitting: Orthopaedic Surgery

## 2020-02-16 ENCOUNTER — Other Ambulatory Visit: Payer: Self-pay

## 2020-02-16 ENCOUNTER — Ambulatory Visit: Payer: Self-pay

## 2020-02-16 ENCOUNTER — Ambulatory Visit: Payer: Federal, State, Local not specified - PPO | Admitting: Orthopaedic Surgery

## 2020-02-16 DIAGNOSIS — M1712 Unilateral primary osteoarthritis, left knee: Secondary | ICD-10-CM

## 2020-02-16 NOTE — Progress Notes (Signed)
Office Visit Note   Patient: Summer [name]           Date of Birth: 09/02/1951           MRN: [id number] Visit Date: 02/16/2020              Requested by: Hoyt Koch, MD [address] [place],  [place] [id number] PCP: Hoyt Koch, MD   Assessment & Plan: Visit Diagnoses:  1. Primary osteoarthritis of left knee     Plan: Impression is advanced left knee tricompartmental DJD.  I reviewed the x-rays with the patient in detail and we had a lengthy discussion on her treatment options and based on our discussion of risk benefits rehab recovery, she will most likely proceed with a left total knee replacement in the near future.  Given her history of cancer we will plan on placing her on Xarelto postoperatively for DVT prophylaxis.  Questions encouraged and answered.  Follow-Up Instructions: Return if symptoms worsen or fail to improve.   Orders:  Orders Placed This Encounter  Procedures  . XR KNEE 3 VIEW LEFT   No orders of the defined types were placed in this encounter.     Procedures: No procedures performed   Clinical Data: No additional findings.   Subjective: Chief Complaint  Patient presents with  . Left Knee - Pain    [name] is a 69 year old female comes in for chronically worsening left knee pain for years.  She has constant aching throbbing pain.  She has taken Tylenol and Voltaren gel and she has had cortisone injections and gel injections all of which have failed to provide her with any meaningful relief.  She is significantly limited by her knee pain.  She did just recently finished chemotherapy for breast cancer.  She denies any numbness and tingling.  Denies any swelling.   Review of Systems  Constitutional: Negative.   HENT: Negative.   Eyes: Negative.   Respiratory: Negative.   Cardiovascular: Negative.   Endocrine: Negative.   Musculoskeletal: Negative.   Neurological: Negative.   Hematological: Negative.     Psychiatric/Behavioral: Negative.   All other systems reviewed and are negative.    Objective: Vital Signs: There were no vitals taken for this visit.  Physical Exam Vitals and nursing note reviewed.  Constitutional:      Appearance: She is well-developed.  HENT:     Head: Normocephalic and atraumatic.  Pulmonary:     Effort: Pulmonary effort is normal.  Abdominal:     Palpations: Abdomen is soft.  Musculoskeletal:     Cervical back: Neck supple.  Skin:    General: Skin is warm.     Capillary Refill: Capillary refill takes less than 2 seconds.  Neurological:     Mental Status: She is alert and oriented to person, place, and time.  Psychiatric:        Behavior: Behavior normal.        Thought Content: Thought content normal.        Judgment: Judgment normal.     Ortho Exam Left knee shows a trace effusion.  Painful range of motion with moderate limitation.  2+ patellofemoral crepitus.  Collaterals and cruciates are stable. Specialty Comments:  No specialty comments available.  Imaging: XR KNEE 3 VIEW LEFT  Result Date: 02/16/2020 Advanced tricompartment DJD worse in the patellofemoral compartment    PMFS History: Patient Active Problem List   Diagnosis Date Noted  . COVID-19 11/06/2019  .  Muscle cramps 05/12/2019  . Cramps, muscle, general 04/23/2019  . Tinnitus 04/16/2019  . Chest pain of uncertain etiology 46/56/8127  . Bilateral leg edema 02/10/2019  . Port-A-Cath in place 01/30/2019  . Anemia, iron deficiency 11/21/2018  . Malignant neoplasm of upper-outer quadrant of right breast in female, estrogen receptor positive ([place]) 11/17/2018  . Itching 10/13/2018  . Left eye pain 07/01/2018  . Constipation 07/01/2018  . Weight loss 07/22/2017  . Memory loss 12/21/2016  . Tremor 12/21/2016  . Right sided temporal headache 12/21/2016  . Trochanteric bursitis, right hip 10/26/2016  . Pruritus 10/26/2016  . Foot sprain, left, initial encounter 08/31/2016  .  Pain of right side of body 08/31/2016  . Headache 04/24/2016  . LLQ pain 04/24/2016  . Glossitis 02/02/2016  . Routine general medical examination at a health care facility 10/27/2015  . Facial pain 10/06/2015  . Trigeminal neuralgia of right side of face 11/30/2010  . Morbid obesity ([place]) 11/09/2010  . Fibromyalgia 11/09/2010  . HEMATURIA, HX OF 11/14/2009  . LOC OSTEOARTHROS NOT SPEC PRIM/SEC OTH [facility] SITE 06/27/2009  . Allergic rhinitis 08/26/2008  . 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 Abdominal pain, left lower quadrant 11/09/2009   Qualifier: Diagnosis of  By: Valma Cava LPN, Izora Gala    . Allergic rhinitis 08/26/2008   Qualifier: Diagnosis of  By: Sherren Mocha MD, Jory Ee   . Anemia, iron deficiency 11/21/2018  . Arthritis shoulders and back  . BACK PAIN, CHRONIC 06/13/2007   Qualifier: Diagnosis of  By: Sherren Mocha MD, Jory Ee   . 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 Constipation 07/01/2018  . Costochondritis 05/04/2014  . Diverticulitis of colon 06/13/2007   S/p segmental colectomy for perforated diverticulitis   . Diverticulosis   . Facial pain 10/06/2015  . Fibromyalgia   . Foot sprain, left, initial encounter 08/31/2016  . Glossitis 02/02/2016  . Headache 04/24/2016  . HEMATURIA UNSPECIFIED 06/25/2008   Qualifier: Diagnosis of  By: Sherren Mocha MD, Jory Ee   . HEMATURIA, HX OF 11/14/2009   Qualifier: Diagnosis of  By: Regino Schultze CMA (AAMA), Apolonio Schneiders    . History of kidney stones   . History of kidney stones   . History of shingles 2012--  no residual pain  . Itching 10/13/2018  . Left eye pain 07/01/2018  . Left ureteral calculus   . LLQ pain 04/24/2016  . LOC OSTEOARTHROS NOT SPEC PRIM/SEC OTH [facility] SITE 06/27/2009   Qualifier: Diagnosis of  By: Sherren Mocha MD, Jory Ee   . Malignant neoplasm of upper-outer quadrant of right breast in female, estrogen receptor positive ([place]) 11/17/2018  . MCI (mild  cognitive impairment)   . Memory loss 12/21/2016  . Morbid obesity ([place]) 11/09/2010  . Pain of right side of body 08/31/2016  . Personal history of chemotherapy   . Personal history of radiation therapy   . Pruritus 10/26/2016  . Renal disorder   . Right sided temporal headache 12/21/2016  . Tremor 12/21/2016  . Trigeminal neuralgia RIGHT  . Trigeminal neuralgia of right side of face 11/30/2010  . Trochanteric bursitis, right hip 10/26/2016  . Urgency of urination   . Weight loss 07/22/2017  . Zoster without complications 01/25/16    Family History  Problem Relation Age of Onset  . Heart disease Maternal Grandmother   . Diabetes Mother   . Breast cancer Maternal Aunt   . Lupus  Cousin   . Colon cancer Neg Hx   . Esophageal cancer Neg Hx   . Rectal cancer Neg Hx   . Stomach cancer Neg Hx     Past Surgical History:  Procedure Laterality Date  . BREAST BIOPSY Right 11/11/2018  . BREAST LUMPECTOMY Right 11/28/2018  . BREAST LUMPECTOMY WITH RADIOACTIVE SEED AND SENTINEL LYMPH NODE BIOPSY Right 11/28/2018   Procedure: RIGHT BREAST LUMPECTOMY WITH RADIOACTIVE SEED AND RIGHT AXILLARY SENTINEL LYMPH NODE BIOPSY;  Surgeon: Excell Seltzer, MD;  Location: [facility];  Service: General;  Laterality: Right;  . CEREBRAL MICROVASCULAR DECOMPRESSION  07-05-2006   RIGHT TRIGEMINAL NERVE  . CYSTOSCOPY/RETROGRADE/URETEROSCOPY/STONE EXTRACTION WITH BASKET  07/04/2012   Procedure: CYSTOSCOPY/RETROGRADE/URETEROSCOPY/STONE EXTRACTION WITH BASKET;  Surgeon: Claybon Jabs, MD;  Location: [facility];  Service: Urology;  Laterality: Left;  . EXPLORATORY LAPAROTOMY/ RESECTION MID TO DISTAL SIGMOID AND PROXIMAL RECTUM/ END PROXIMAL SIGMOID COLOSTOMY  07-17-2006   PERFORATED DIVERTICULITIS WITH PERITONITIS  . gamma knife  05/17/2016   for trigeminal neuralgia, [facility], Dr Salomon Fick  . gamma knife  2019  . IR CV LINE INJECTION  10/27/2019  . PORTACATH PLACEMENT Left 11/28/2018   Procedure:  INSERTION PORT-A-CATH WITH ULTRASOUND;  Surgeon: Excell Seltzer, MD;  Location: [place];  Service: General;  Laterality: Left;  . RESECTION COLOSTOMY/ CLOSURE COLOSTOMY WITH COLOPROCTOSTOMY  02-20-2007  . TONSILLECTOMY  AS CHILD  . URETEROSCOPY  07/04/2012   Procedure: URETEROSCOPY;  Surgeon: Claybon Jabs, MD;  Location: [facility];  Service: Urology;  Laterality: Left;  Marland Kitchen VAGINAL HYSTERECTOMY  1998   Partial  . WISDOM TOOTH EXTRACTION     Social History   Occupational History    Employer: UNEMPLOYED  . Occupation: HUMAN RESOURCES     Employer: [employer]  Tobacco Use  . Smoking status: Never Smoker  . Smokeless tobacco: Never Used  Substance and Sexual Activity  . Alcohol use: Yes    Comment: rarely  . Drug use: No  . Sexual activity: Not on file

## 2020-02-19 ENCOUNTER — Encounter: Payer: Self-pay | Admitting: Hematology

## 2020-02-19 ENCOUNTER — Other Ambulatory Visit: Payer: Self-pay

## 2020-02-19 ENCOUNTER — Telehealth: Payer: Self-pay

## 2020-02-19 DIAGNOSIS — Z170 Estrogen receptor positive status [ER+]: Secondary | ICD-10-CM

## 2020-02-19 DIAGNOSIS — Z95828 Presence of other vascular implants and grafts: Secondary | ICD-10-CM

## 2020-02-19 NOTE — Telephone Encounter (Signed)
Port removal referral, and ov note from 01/22/2020 faxed to [facility]

## 2020-02-22 ENCOUNTER — Telehealth: Payer: Self-pay | Admitting: Orthopaedic Surgery

## 2020-02-22 IMAGING — MG MM BREAST SURGICAL SPECIMEN
1 series · 1 of 1 positions shown · non-contrast
Comparison: Previous exam(s).

CLINICAL DATA: Right lumpectomy for recently diagnosed invasive
ductal carcinoma.

EXAM:
SPECIMEN RADIOGRAPH OF THE RIGHT BREAST

[R]
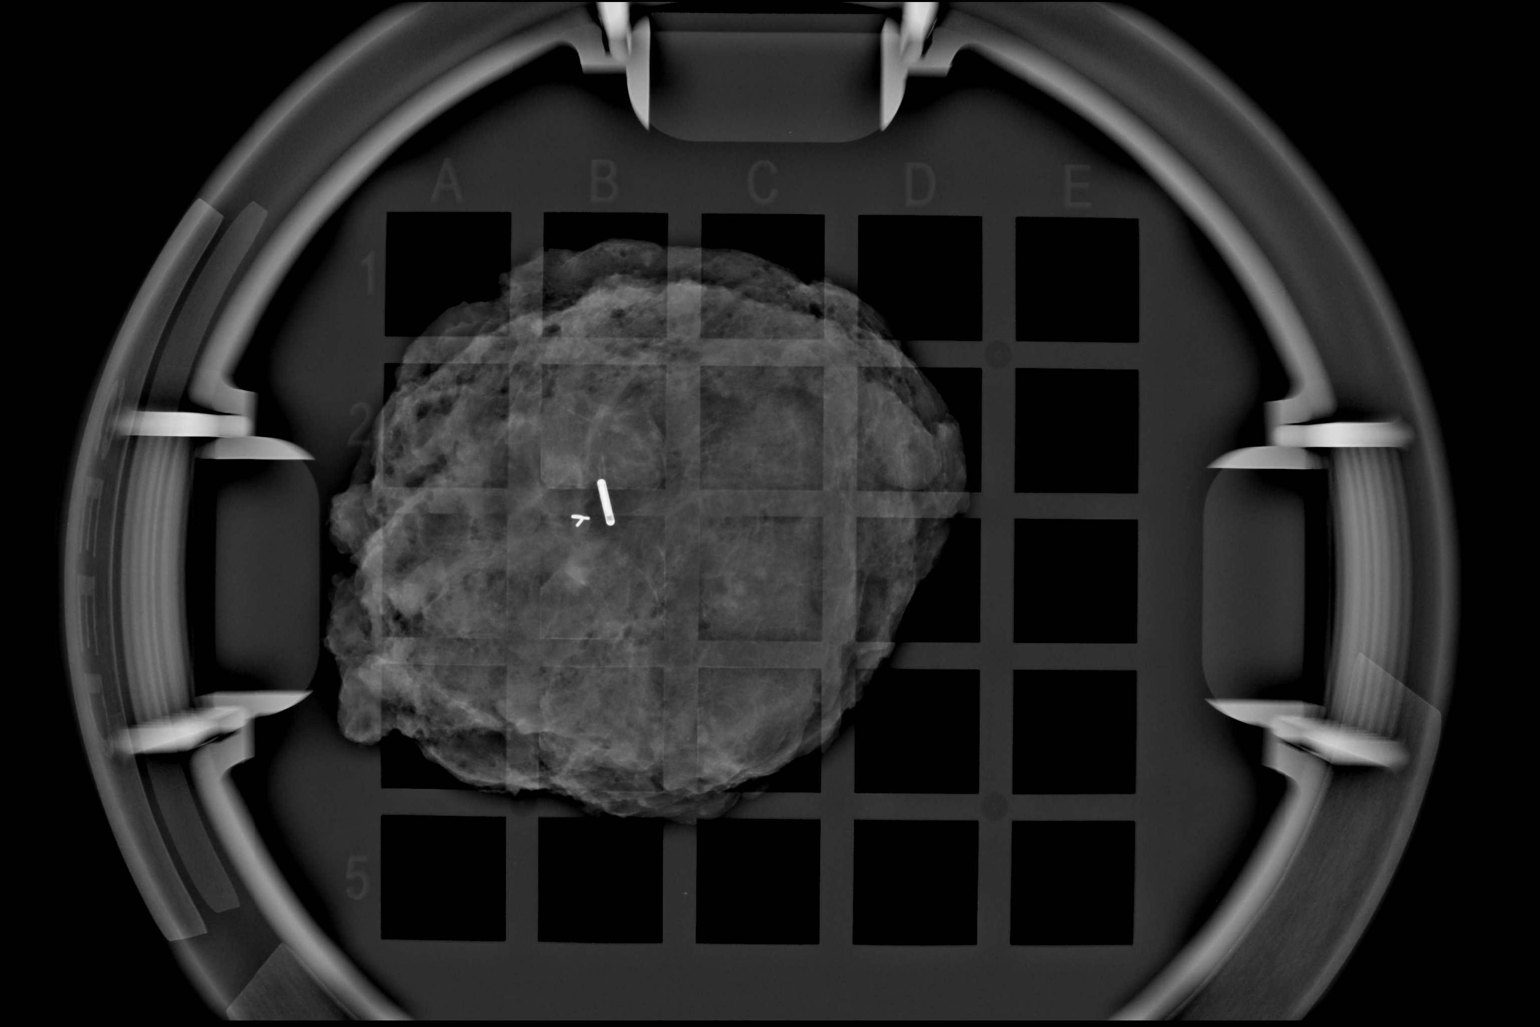

[1 of 1 positions shown; findings below may reference images not displayed]

FINDINGS: Status post excision of the right breast. The radioactive seed and
ribbon shaped biopsy marker clip are present, completely intact, and
were marked for pathology.
IMPRESSION: Specimen radiograph of the right breast.

## 2020-02-22 IMAGING — DX PORTABLE CHEST - 1 VIEW
1 series · 1 of 1 positions shown · non-contrast
Comparison: 02/08/2017

CLINICAL DATA: Post op

EXAM:
PORTABLE CHEST 1 VIEW

[chest ap]
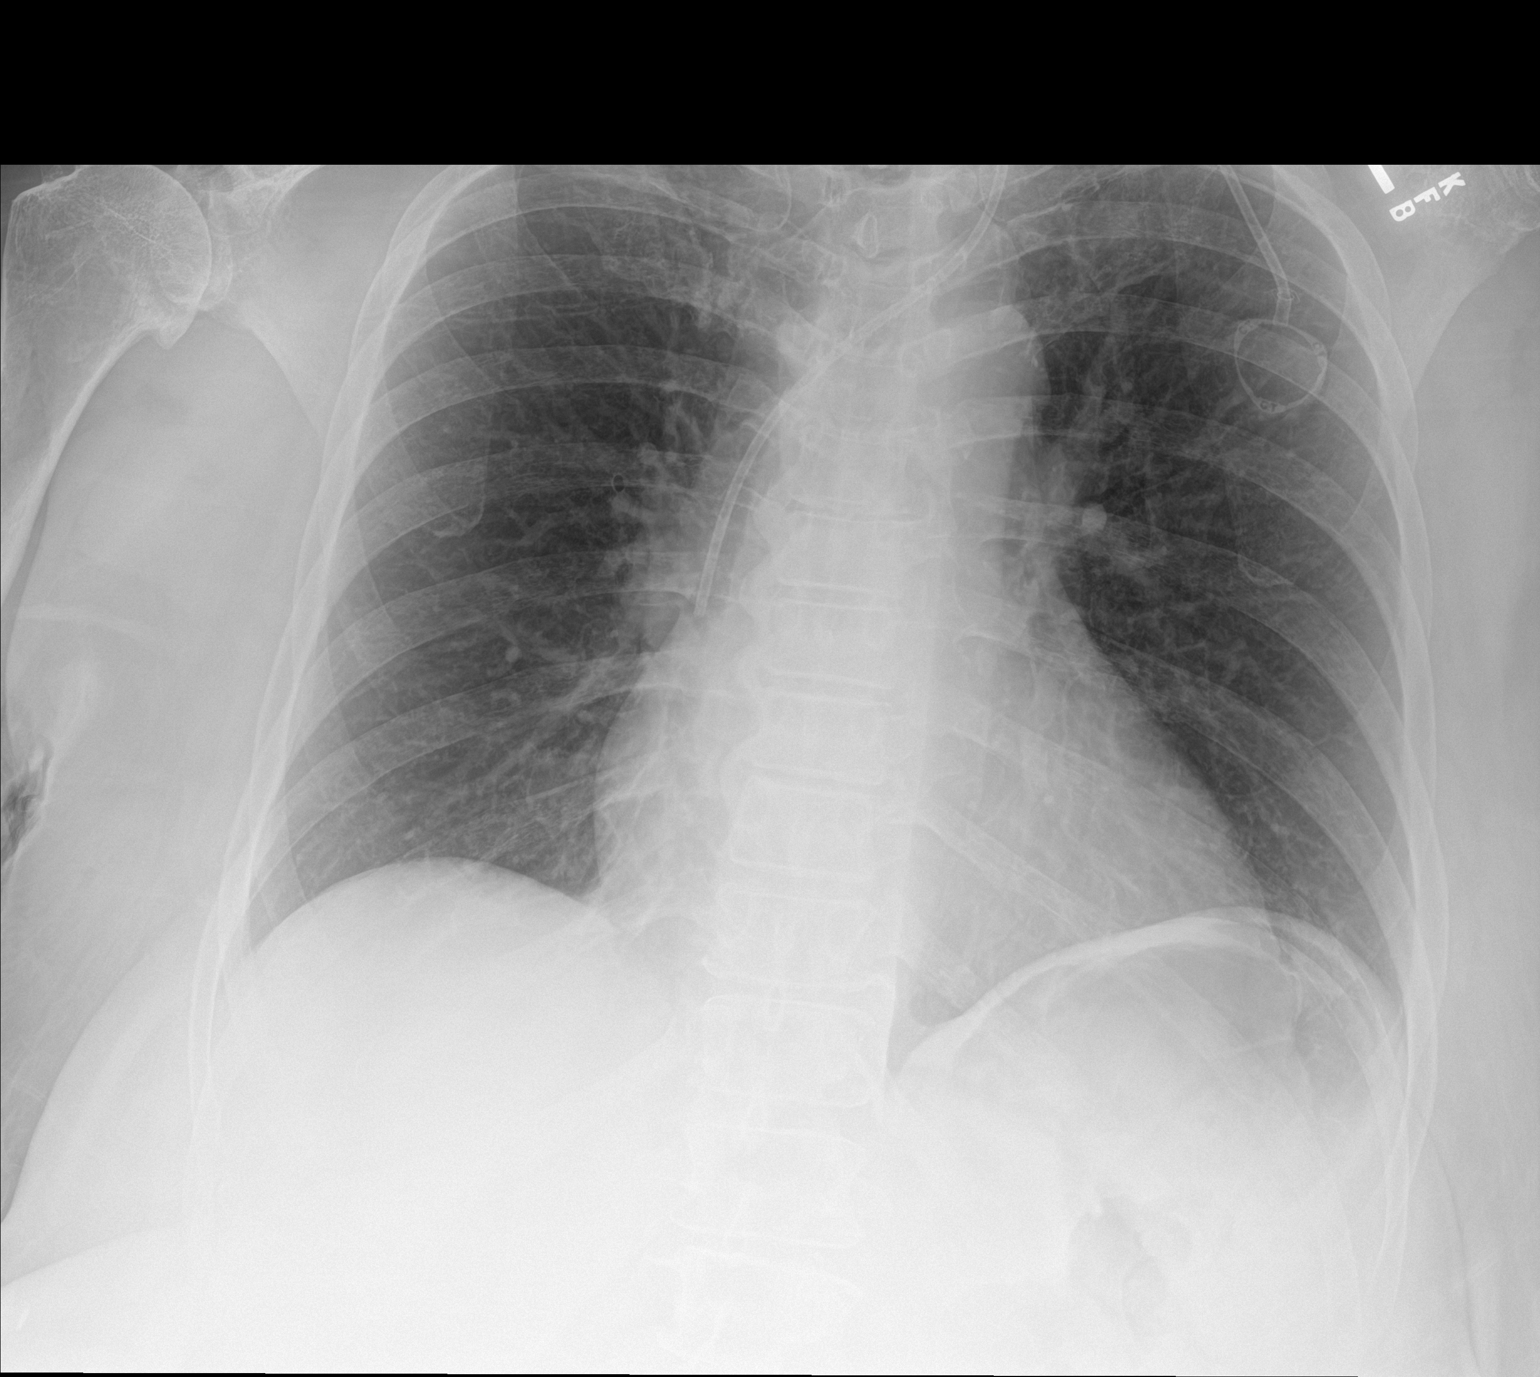

[1 of 1 positions shown; findings below may reference images not displayed]

FINDINGS: Status post placement of LEFT-sided power port, tip overlying the
level of the superior vena cava. Heart size is accentuated by the
portable technique. The lungs are clear. No edema. Line placement.
No pneumothorax following
IMPRESSION: LEFT-sided power port without evidence for complications. No
evidence for acute cardiopulmonary abnormality.

## 2020-02-22 NOTE — Telephone Encounter (Signed)
I called patient and scheduled surgery. 

## 2020-02-22 NOTE — Telephone Encounter (Signed)
Patient called and would like a return call in reference to scheduling her surgery.  CB#[phone].  Thank you.

## 2020-02-25 ENCOUNTER — Telehealth: Payer: Self-pay | Admitting: Orthopaedic Surgery

## 2020-02-25 NOTE — Telephone Encounter (Signed)
Please advise how long would need to be out of work?

## 2020-02-25 NOTE — Telephone Encounter (Signed)
Up to 12 weeks

## 2020-02-25 NOTE — Telephone Encounter (Signed)
done

## 2020-02-25 NOTE — Telephone Encounter (Signed)
Patient called.   She needs a note for work detailing her date of surgery and how long she will be out of work.   Call back: etail date of surgery and how ;ong she will be out. Upload to [employer]

## 2020-02-26 ENCOUNTER — Encounter: Payer: Self-pay | Admitting: Hematology

## 2020-02-26 ENCOUNTER — Ambulatory Visit (INDEPENDENT_AMBULATORY_CARE_PROVIDER_SITE_OTHER): Payer: Federal, State, Local not specified - PPO | Admitting: Internal Medicine

## 2020-02-26 ENCOUNTER — Telehealth: Payer: Self-pay | Admitting: Orthopaedic Surgery

## 2020-02-26 ENCOUNTER — Other Ambulatory Visit: Payer: Self-pay

## 2020-02-26 ENCOUNTER — Encounter: Payer: Self-pay | Admitting: Family Medicine

## 2020-02-26 ENCOUNTER — Encounter: Payer: Self-pay | Admitting: Internal Medicine

## 2020-02-26 ENCOUNTER — Telehealth: Payer: Self-pay

## 2020-02-26 VITALS — BP 146/78 | HR 80 | Temp 98.3°F | Ht 66.0 in | Wt 186.0 lb

## 2020-02-26 DIAGNOSIS — R200 Anesthesia of skin: Secondary | ICD-10-CM | POA: Diagnosis not present

## 2020-02-26 DIAGNOSIS — G8929 Other chronic pain: Secondary | ICD-10-CM

## 2020-02-26 DIAGNOSIS — R600 Localized edema: Secondary | ICD-10-CM

## 2020-02-26 DIAGNOSIS — R7301 Impaired fasting glucose: Secondary | ICD-10-CM

## 2020-02-26 DIAGNOSIS — Z13220 Encounter for screening for lipoid disorders: Secondary | ICD-10-CM

## 2020-02-26 DIAGNOSIS — M25562 Pain in left knee: Secondary | ICD-10-CM | POA: Insufficient documentation

## 2020-02-26 DIAGNOSIS — M79605 Pain in left leg: Secondary | ICD-10-CM

## 2020-02-26 DIAGNOSIS — R202 Paresthesia of skin: Secondary | ICD-10-CM | POA: Diagnosis not present

## 2020-02-26 LAB — LIPID PANEL
Cholesterol: 193 mg/dL (ref 0–200)
HDL: 72.3 mg/dL (ref >39.00)
LDL Cholesterol: 111 mg/dL — ABNORMAL HIGH (ref 0–99)
NonHDL: 120.22
Total CHOL/HDL Ratio: 3
Triglycerides: 46 mg/dL (ref 0.0–149.0)
VLDL: 9.2 mg/dL (ref 0.0–40.0)

## 2020-02-26 LAB — HEMOGLOBIN A1C: Hgb A1c MFr Bld: 5.6 % (ref 4.6–6.5)

## 2020-02-26 LAB — VITAMIN D 25 HYDROXY (VIT D DEFICIENCY, FRACTURES): VITD: 16.14 ng/mL — ABNORMAL LOW (ref 30.00–100.00)

## 2020-02-26 LAB — VITAMIN B12: Vitamin B-12: 488 pg/mL (ref 211–911)

## 2020-02-26 LAB — TSH: TSH: 2.35 u[IU]/mL (ref 0.35–4.50)

## 2020-02-26 MED ORDER — CYCLOBENZAPRINE HCL 10 MG PO TABS
10.0000 mg | ORAL_TABLET | Freq: Three times a day (TID) | ORAL | 0 refills | Status: DC | PRN
Start: 2020-02-26 — End: 2020-05-21

## 2020-02-26 MED ORDER — TRAMADOL HCL 50 MG PO TABS: 50.0000 mg | ORAL_TABLET | Freq: Every evening | ORAL | 0 refills | Status: DC | PRN

## 2020-02-26 NOTE — Telephone Encounter (Signed)
Pt states she can't get her work note off of MyChart and was hoping we could e-mail it to her instead.    [email or web address]

## 2020-02-26 NOTE — Telephone Encounter (Signed)
Patient called requesting a lift chair prescription.  Please call patient to advise if and when done.  Can Rx be put in My Chart?  [phone]

## 2020-02-26 NOTE — Progress Notes (Signed)
   Subjective:   Patient ID: Summer [name], female    DOB: [birth year], 69 y.o.   MRN: [id number]  HPI The patient is a 69 YO female coming in for concerns with left knee pain (seeing orthopedics and plans for knee replacement in October, she is having severe pain with standing especially after sitting for some time, orthopedics has tried baclofen and tizanidine and tramadol which do not help, they refilled tramadol today after she called them for something but she does not want this stating it does not help and hopes we can send in something different for her) and tingling in her arms (started recently, mostly happens at night time, denies during the day, does get some mild pains with it, has not tried anything, overall stable since onset, denies chest pains, recent ECHO 01/2020 which was normal) and swelling in the left leg (she is worried about diabetes as this runs in her family, several sugars have been mildly elevated in the past on labs, no recent HgA1c in the past year, denies numbness in her feet, does have severe arthritis in the left knee and her surgeon thinks it might be related, she is getting knee replacement in October).   Review of Systems  Constitutional: Positive for activity change.  HENT: Negative.   Eyes: Negative.   Respiratory: Negative for cough, chest tightness and shortness of breath.   Cardiovascular: Positive for leg swelling. Negative for chest pain and palpitations.  Gastrointestinal: Negative for abdominal distention, abdominal pain, constipation, diarrhea, nausea and vomiting.  Musculoskeletal: Positive for arthralgias, gait problem and myalgias.  Skin: Negative.   Neurological: Positive for numbness. Negative for dizziness, weakness, light-headedness and headaches.  Psychiatric/Behavioral: Negative.     Objective:  Physical Exam Constitutional:      Appearance: She is well-developed.  HENT:     Head: Normocephalic and atraumatic.  Cardiovascular:      Rate and Rhythm: Normal rate and regular rhythm.  Pulmonary:     Effort: Pulmonary effort is normal. No respiratory distress.     Breath sounds: Normal breath sounds. No wheezing or rales.  Abdominal:     General: Bowel sounds are normal. There is no distension.     Palpations: Abdomen is soft.     Tenderness: There is no abdominal tenderness. There is no rebound.  Musculoskeletal:     Cervical back: Normal range of motion.     Comments: 1+ non-pitting edema left more than right, pain in the left knee  Skin:    General: Skin is warm and dry.  Neurological:     Mental Status: She is alert and oriented to person, place, and time.     Coordination: Coordination normal.     Comments: Slow to stand     Vitals:   02/26/20 1546  BP: (!) 146/78  Pulse: 80  Temp: 98.3 F (36.8 C)  TempSrc: Oral  SpO2: 96%  Weight: 186 lb (84.4 kg)  Height: 5\' 6"  (1.676 m)    This visit occurred during the SARS-CoV-2 public health emergency.  Safety protocols were in place, including screening questions prior to the visit, additional usage of staff PPE, and extensive cleaning of exam room while observing appropriate contact time as indicated for disinfecting solutions.   Assessment & Plan:

## 2020-02-26 NOTE — Assessment & Plan Note (Signed)
She denies change in sodium intake but we talked about cutting back on sodium to help. Recent echo, CMP normal ruling out metabolic causes. Does not want compression stockings due to pain.

## 2020-02-26 NOTE — Telephone Encounter (Signed)
Holding for Liz/Xu

## 2020-02-26 NOTE — Patient Instructions (Addendum)
We have sent in the flexeril for the pain in the knee which you can take up to 3 times a day.  We will check the labs today.

## 2020-02-26 NOTE — Assessment & Plan Note (Signed)
Rx flexeril to help with pain. Since her orthopedic is prescribing controlled substances would recommend she work with them if tramadol is not working.

## 2020-02-26 NOTE — Assessment & Plan Note (Signed)
Likely positional. Checking HgA1c, thyroid, B12 and vitamin D to rule out metabolic causes. Recent CMP and CBC normal.

## 2020-02-29 ENCOUNTER — Other Ambulatory Visit: Payer: Self-pay | Admitting: Internal Medicine

## 2020-02-29 MED ORDER — VITAMIN D (ERGOCALCIFEROL) 1.25 MG (50000 UNIT) PO CAPS
50000.0000 [IU] | ORAL_CAPSULE | ORAL | 0 refills | Status: DC
Start: 2020-02-29 — End: 2020-08-15

## 2020-02-29 NOTE — Telephone Encounter (Signed)
yes

## 2020-02-29 NOTE — Telephone Encounter (Signed)
Emailed to patient per her request

## 2020-02-29 NOTE — Telephone Encounter (Signed)
Ok for this? 

## 2020-03-02 ENCOUNTER — Telehealth: Payer: Self-pay | Admitting: Internal Medicine

## 2020-03-02 NOTE — Telephone Encounter (Signed)
New Message:   Pt is returning a call for lab results. Please advise.

## 2020-03-02 NOTE — Telephone Encounter (Signed)
New message:   Pt is calling again to get results. I advised pt that MA has left for today and will call on tomorrow. Please advise.

## 2020-03-03 ENCOUNTER — Encounter: Payer: Self-pay | Admitting: Family Medicine

## 2020-03-03 NOTE — Telephone Encounter (Signed)
New message:   Pt is returning a call for labs. Please advise.

## 2020-03-03 NOTE — Telephone Encounter (Signed)
Called pt, LVM.   

## 2020-03-04 ENCOUNTER — Ambulatory Visit: Payer: Federal, State, Local not specified - PPO

## 2020-03-09 ENCOUNTER — Telehealth: Payer: Self-pay

## 2020-03-09 NOTE — Telephone Encounter (Signed)
Mailed out to her house an  rx for lift chair.

## 2020-03-11 ENCOUNTER — Encounter: Payer: Self-pay | Admitting: Internal Medicine

## 2020-03-24 ENCOUNTER — Encounter: Payer: Self-pay | Admitting: *Deleted

## 2020-03-25 ENCOUNTER — Ambulatory Visit: Payer: Federal, State, Local not specified - PPO | Admitting: Hematology

## 2020-03-25 ENCOUNTER — Other Ambulatory Visit: Payer: Federal, State, Local not specified - PPO

## 2020-03-25 ENCOUNTER — Ambulatory Visit: Payer: Federal, State, Local not specified - PPO

## 2020-04-06 ENCOUNTER — Ambulatory Visit: Payer: Federal, State, Local not specified - PPO | Admitting: Rheumatology

## 2020-04-12 ENCOUNTER — Other Ambulatory Visit: Payer: Self-pay | Admitting: Internal Medicine

## 2020-04-14 ENCOUNTER — Encounter: Payer: Self-pay | Admitting: Hematology

## 2020-04-21 ENCOUNTER — Ambulatory Visit (INDEPENDENT_AMBULATORY_CARE_PROVIDER_SITE_OTHER): Payer: Federal, State, Local not specified - PPO | Admitting: Family Medicine

## 2020-04-21 ENCOUNTER — Encounter: Payer: Self-pay | Admitting: Family Medicine

## 2020-04-21 ENCOUNTER — Ambulatory Visit: Payer: Federal, State, Local not specified - PPO

## 2020-04-21 ENCOUNTER — Other Ambulatory Visit: Payer: Self-pay

## 2020-04-21 ENCOUNTER — Ambulatory Visit: Payer: Self-pay

## 2020-04-21 VITALS — BP 130/64 | HR 70 | Ht 66.0 in | Wt 190.8 lb

## 2020-04-21 DIAGNOSIS — M25512 Pain in left shoulder: Secondary | ICD-10-CM

## 2020-04-21 DIAGNOSIS — M79601 Pain in right arm: Secondary | ICD-10-CM

## 2020-04-21 DIAGNOSIS — M25511 Pain in right shoulder: Secondary | ICD-10-CM | POA: Diagnosis not present

## 2020-04-21 DIAGNOSIS — G8929 Other chronic pain: Secondary | ICD-10-CM | POA: Diagnosis not present

## 2020-04-21 NOTE — Patient Instructions (Addendum)
Thank you for coming in today. Plan for PT.  Get xray today on your way out.  Recheck in 1 month.  If worsening let me know. I can do an injection for both.    Rotator Cuff Tendinitis  Rotator cuff tendinitis is inflammation of the tough, cord-like bands that connect muscle to bone (tendons) in the rotator cuff. The rotator cuff includes all of the muscles and tendons that connect the arm to the shoulder. The rotator cuff holds the head of the upper arm bone (humerus) in the cup (fossa) of the shoulder blade (scapula). This condition can lead to a long-lasting (chronic) tear. The tear may be partial or complete. What are the causes? This condition is usually caused by overusing the rotator cuff. What increases the risk? This condition is more likely to develop in athletes and workers who frequently use their shoulder or reach over their heads. This can include activities such as:  Tennis.  Baseball or softball.  Swimming.  Construction work.  Painting. What are the signs or symptoms? Symptoms of this condition include:  Pain spreading (radiating) from the shoulder to the upper arm.  Swelling and tenderness in front of the shoulder.  Pain when reaching, pulling, or lifting the arm above the head.  Pain when lowering the arm from above the head.  Minor pain in the shoulder when resting.  Increased pain in the shoulder at night.  Difficulty placing the arm behind the back. How is this diagnosed? This condition is diagnosed with a medical history and physical exam. Tests may also be done, including:  X-rays.  MRI.  Ultrasounds.  CT or MR arthrogram. During this test, a contrast material is injected and then images are taken. How is this treated? Treatment for this condition depends on the severity of the condition. In less severe cases, treatment may include:  Rest. This may be done with a sling that holds the shoulder still (immobilization). Your health care  provider may also recommend avoiding activities that involve lifting your arm over your head.  Icing the shoulder.  Anti-inflammatory medicines, such as aspirin or ibuprofen. In more severe cases, treatment may include:  Physical therapy.  Steroid injections.  Surgery. Follow these instructions at home: If you have a sling:  Wear the sling as told by your health care provider. Remove it only as told by your health care provider.  Loosen the sling if your fingers tingle, become numb, or turn cold and blue.  Keep the sling clean.  If the sling is not waterproof, do not let it get wet. Remove it, if allowed, or cover it with a watertight covering when you take a bath or shower. Managing pain, stiffness, and swelling  If directed, put ice on the injured area. ? If you have a removable sling, remove it as told by your health care provider. ? Put ice in a plastic bag. ? Place a towel between your skin and the bag. ? Leave the ice on for 20 minutes, 2-3 times a day.  Move your fingers often to avoid stiffness and to lessen swelling.  Raise (elevate) the injured area above the level of your heart while you are lying down.  Find a comfortable sleeping position or sleep on a recliner, if available. Driving  Do not drive or use heavy machinery while taking prescription pain medicine.  Ask your health care provider when it is safe to drive if you have a sling on your arm. Activity  Rest your shoulder  as told by your health care provider.  Return to your normal activities as told by your health care provider. Ask your health care provider what activities are safe for you.  Do any exercises or stretches as told by your health care provider.  If you do repetitive overhead tasks, take small breaks in between and include stretching exercises as told by your health care provider. General instructions  Do not use any products that contain nicotine or tobacco, such as cigarettes and  e-cigarettes. These can delay healing. If you need help quitting, ask your health care provider.  Take over-the-counter and prescription medicines only as told by your health care provider.  Keep all follow-up visits as told by your health care provider. This is important. Contact a health care provider if:  Your pain gets worse.  You have new pain in your arm, hands, or fingers.  Your pain is not relieved with medicine or does not get better after 6 weeks of treatment.  You have cracking sensations when moving your shoulder in certain directions.  You hear a snapping sound after using your shoulder, followed by severe pain and weakness. Get help right away if:  Your arm, hand, or fingers are numb or tingling.  Your arm, hand, or fingers are swollen or painful or they turn white or blue. Summary  Rotator cuff tendinitis is inflammation of the tough, cord-like bands that connect muscle to bone (tendons) in the rotator cuff.  This condition is usually caused by overusing the rotator cuff, which includes all of the muscles and tendons that connect the arm to the shoulder.  This condition is more likely to develop in athletes and workers who frequently use their shoulder or reach over their heads.  Treatment generally includes rest, anti-inflammatory medicines, and icing. In some cases, physical therapy and steroid injections may be needed. In severe cases, surgery may be needed. This information is not intended to replace advice given to you by your health care provider. Make sure you discuss any questions you have with your health care provider. Document Revised: 12/19/2018 Document Reviewed: 08/13/2016 Elsevier Patient Education  [place].

## 2020-04-21 NOTE — Progress Notes (Signed)
Subjective:    CC: B shoulder pain  I, Molly Weber, LAT, ATC, am serving as scribe for Dr. Lynne Leader.  HPI: Pt is a 69 y/o female presenting w/ c/o B shoulder pain, R>L. Pain for past 2 months. Pain at night when she trying to sleep. Pain throughout the joint. Feels numbness in right hand intermittently. Using Tylenol.  She has been doing a lot of gardening and notes that that may be making her shoulder worse.  Pertinent review of Systems: No fevers or chills  Relevant historical information: History breast cancer right breast.  Bilateral knee DJD   Objective:    Vitals:   04/21/20 1527  BP: 130/64  Pulse: 70  SpO2: 97%   General: Well Developed, well nourished, and in no acute distress.   MSK: Right shoulder normal-appearing Nontender. Range of motion abduction 100 degrees external rotation normal internal rotation lumbar spine. Strength within limits of motion intact abduction external/internal rotation.  Positive Hawkins and Neer's test. Positive empty can test. Negative Yergason's and speeds test.  Left shoulder normal-appearing Nontender. Range of motion abduction 110 degrees.  Full external rotation.  Internal rotation thoracic spine. Intact strength. Positive Hawkins and Neer's test.  Positive empty can test. Negative Yergason's and speeds test.  C-spine normal-appearing Nontender midline. Decreased motion significantly limited lateral flexion bilaterally. Negative Spurling's test. Upper extremity reflexes sensation and strength are intact    Lab and Radiology Results  Diagnostic Limited MSK Ultrasound of: Right shoulder Biceps tendon intact. Subscapularis tendon intact without full-thickness tear. Supraspinatus tendon increased hyperechoic change within the mid substance tendon. Mild subacromial bursitis. Infraspinatus tendon intact. AC joint narrowed degenerative. Impression: Supraspinatus calcific tendinitis  Diagnostic Limited MSK  Ultrasound of: Left shoulder Biceps tendon intact. Subscapularis tendon intact without full-thickness tear. Supraspinatus tendon hypoechoic change distal tendon insertion possible partial tear without significant retraction. Preservation intact. AC joint narrowed degenerative. Impression: Tendinopathy possible partial tear.   Impression and Recommendations:    Assessment and Plan: 69 y.o. female with bilateral shoulder pain right worse than left.  Patient certainly has rotator cuff dysfunction and tendinopathy and possible partial tears.  Bilateral shoulder x-ray ordered and will be completed in the near future.  Plan for trial of physical therapy and recheck back in 4 to 6 weeks.  Radiating pain right arm.  Concerning for radiculopathy C6 or C7.  May also have some carpal tunnel syndrome.  Begin physical therapy recheck in a few weeks.     Orders Placed This Encounter  Procedures  . [place] LIMITED JOINT SPACE STRUCTURES UP RIGHT(NO LINKED CHARGES)    Order Specific Question:   Reason for Exam (SYMPTOM  OR DIAGNOSIS REQUIRED)    Answer:   bilateral shoulder pain    Order Specific Question:   Preferred imaging location?    Answer:   [place]  . DG Shoulder Right    Standing Status:   Future    Number of Occurrences:   1    Standing Expiration Date:   04/21/2021    Order Specific Question:   Reason for Exam (SYMPTOM  OR DIAGNOSIS REQUIRED)    Answer:   eval shoulder pain    Order Specific Question:   Preferred imaging location?    Answer:   [facility]    Order Specific Question:   Radiology Contrast Protocol - do NOT remove file path    Answer:   \\[email or web address]\[email or web address].[email or web address]  . DG Shoulder Left    Standing Status:  Future    Number of Occurrences:   1    Standing Expiration Date:   04/21/2021    Order Specific Question:   Reason for Exam (SYMPTOM  OR DIAGNOSIS REQUIRED)    Answer:   eval left shoulder    Order  Specific Question:   Preferred imaging location?    Answer:   [facility]    Order Specific Question:   Radiology Contrast Protocol - do NOT remove file path    Answer:   \\[email or web address]\DXFluoroContrastProtocols.pdf  . Ambulatory referral to Physical Therapy    Referral Priority:   Routine    Referral Type:   Physical Medicine    Referral Reason:   Specialty Services Required    Requested Specialty:   Physical Therapy   No orders of the defined types were placed in this encounter.   Discussed warning signs or symptoms. Please see discharge instructions. Patient expresses understanding.   The above documentation has been reviewed and is accurate and complete Lynne Leader, M.D.

## 2020-04-25 ENCOUNTER — Other Ambulatory Visit: Payer: Federal, State, Local not specified - PPO

## 2020-04-25 ENCOUNTER — Encounter: Payer: Federal, State, Local not specified - PPO | Admitting: Nurse Practitioner

## 2020-04-25 ENCOUNTER — Ambulatory Visit: Payer: Federal, State, Local not specified - PPO | Admitting: Family Medicine

## 2020-04-26 NOTE — Progress Notes (Signed)
CLINIC:  Survivorship   Patient Care Team: Hoyt Koch, MD as PCP - General (Internal Medicine) Sueanne Margarita, MD as PCP - Cardiology (Cardiology) Mauro Kaufmann, RN as Oncology Nurse Navigator Rockwell Germany, RN as Oncology Nurse Navigator Excell Seltzer, MD (Inactive) as Consulting Physician (General Surgery) Truitt Merle, MD as Consulting Physician (Hematology) Gery Pray, MD as Consulting Physician (Radiation Oncology) Alla Feeling, NP as Nurse Practitioner (Nurse Practitioner)  REASON FOR VISIT:  Routine follow-up post-treatment for a recent history of breast cancer.  BRIEF ONCOLOGIC HISTORY:  Oncology History Overview Note  Cancer Staging Malignant neoplasm of upper-outer quadrant of right breast in female, estrogen receptor positive ([place]) Staging form: Breast, AJCC 8th Edition - Clinical stage from 11/10/2018: Stage IA (cT1b, cN0, cM0, G2, ER+, PR+, HER2+) - Signed by Truitt Merle, MD on 11/18/2018     Malignant neoplasm of upper-outer quadrant of right breast in female, estrogen receptor positive ([place])  11/07/2018 Mammogram   Diagnostic 11/07/18 IMPRESSION: 1. 1 x 0.7 x 0.6 cm hypoechoic mass with distortion at the 9-9:30 position of the RIGHT breast 6 cm from the nipple with distortion in the UPPER-OUTER RIGHT breast, corresponding to the mammographic abnormality. One RIGHT axillary lymph node with borderline cortical thickness. Tissue sampling of the RIGHT breast mass and borderline RIGHT axillary lymph node recommended. ADDENDUM: Patient returned today for biopsy of a single borderline thickened lymph node in the RIGHT axilla. I could not reproduce a morphologically abnormal lymph node today, possibly resolved in the interval. Today, only normal appearing lymph nodes were identified in the RIGHT axilla. As such, the axillary ultrasound-guided biopsy was canceled.   11/10/2018 Cancer Staging   Staging form: Breast, AJCC 8th Edition - Clinical  stage from 11/10/2018: Stage IA (cT1b, cN0, cM0, G2, ER+, PR+, HER2+) - Signed by Truitt Merle, MD on 11/18/2018   11/11/2018 Initial Biopsy   Diagnosis 11/11/18 Breast, right, needle core biopsy, upper outer - 9:30 o'clock position - INVASIVE DUCTAL CARCINOMA, GRADE II/III. - SEE MICROSCOPIC DESCRIPTION.   11/11/2018 Receptors her2   Results: IMMUNOHISTOCHEMICAL AND MORPHOMETRIC ANALYSIS PERFORMED MANUALLY The tumor cells are POSITIVE for Her2 (3+). Estrogen Receptor: 95%, POSITIVE, STRONG STAINING INTENSITY Progesterone Receptor: 5%, POSITIVE, STRONG STAINING INTENSITY Proliferation Marker Ki67: 20%   11/17/2018 Initial Diagnosis   Malignant neoplasm of upper-outer quadrant of right breast in female, estrogen receptor positive ([place])   11/28/2018 Surgery   RIGHT BREAST LUMPECTOMY WITH RADIOACTIVE SEED AND RIGHT AXILLARY SENTINEL LYMPH NODE BIOPSY by Dr. Excell Seltzer  11/28/18    11/28/2018 Pathology Results   Diagnosis 11/28/18  1. Breast, lumpectomy, Right w/seed - INVASIVE DUCTAL CARCINOMA, 1.6 CM, NOTTINGHAM GRADE 2 OF 3. - MARGINS OF RESECTION ARE NOT INVOLVED (CLOSEST MARGIN: LESS THAN 1 MM, ANTERIOR). - DUCTAL CARCINOMA IN SITU. - BIOPSY SITE CHANGES. - SEE ONCOLOGY TABLE. 2. Lymph node, sentinel, biopsy, right Axillary - ONE LYMPH NODE, NEGATIVE FOR CARCINOMA (0/1). 3. Lymph node, sentinel, biopsy, right Axillary - ONE LYMPH NODE, NEGATIVE FOR CARCINOMA (0/1).   11/28/2018 Cancer Staging   Staging form: Breast, AJCC 8th Edition - Pathologic stage from 11/28/2018: Stage IA (pT1c, pN0, cM0, G2, ER+, PR+, HER2+) - Signed by Truitt Merle, MD on 12/11/2018   12/22/2018 Procedure   Baseline ECHO 12/22/18  IMPRESSIONS  1. The left ventricle has normal systolic function with an ejection fraction of 60-65%. The cavity size was normal. There is mild concentric left ventricular hypertrophy. Left ventricular diastolic parameters were normal.  2. The  right ventricle has normal systolic function. The cavity  was normal. There is no increase in right ventricular wall thickness.  3. Left atrial size was moderately dilated.  4. The aortic valve is tricuspid. Aortic valve regurgitation was not assessed by color flow Doppler.    01/02/2019 - 02/12/2020 Chemotherapy   Adjuvant chemo weekly Taxol with Herceptin starting 01/02/19. Will d/c after 02/13/19 and will switch to Kadcyla q3weeks starting 02/20/19 due to worsening neuropathy. Due to worsening Muscle cramps Kadcyla stopped 04/03/19 and switched to Herceptin q3weeks on 04/23/19. Will complete on 02/12/20   04/09/2019 - 05/28/2019 Radiation Therapy   RT with Dr. Sondra Come 04/09/19-05/28/19   06/29/2019 -  Anti-estrogen oral therapy   Exemestane 47m daily starting 06/29/19   04/28/2020 Survivorship   SCP delivered by LCira Rue NP     INTERVAL HISTORY:  Ms. LRit[facility]yp[facility] for our initial meeting to review her survivorship care plan detailing her treatment course for breast cancer, as well as monitoring long-term side effects of that treatment, education regarding health maintenance, screening, and overall wellness and health promotion.     Overall, Ms. LThollis doing well. She has dry flaking skin on her face, she applies vitamin E because she doesn't know what else do. She is fatigued, working 6-8 days. Denies new concerns in her breasts.   She has more constipation this week, takes colace and miralax once daily. Does not move around a lot at work. Doesn't drink much water. Denies unintentional weight loss, GI bleeding, n/v, abdominal pain or bloating, new bone pain or hot flashes.     ONCOLOGY TREATMENT TEAM:  1. Surgeon:  [facility] 2. Medical Oncologist: Dr. FBurr Medico 3. Radiation Oncologist: Dr. KSondra Come   PAST MEDICAL/SURGICAL HISTORY:  Past Medical History:  Diagnosis Date  . Abdominal pain, left lower quadrant 11/09/2009   Qualifier: Diagnosis of  By: NValma CavaLPN, NIzora Gala   . Allergic  rhinitis 08/26/2008   Qualifier: Diagnosis of  By: TSherren MochaMD, JJory Ee  . Anemia, iron deficiency 11/21/2018  . Arthritis shoulders and back  . BACK PAIN, CHRONIC 06/13/2007   Qualifier: Diagnosis of  By: TSherren MochaMD, JJory Ee  . 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 Constipation 07/01/2018  . Costochondritis 05/04/2014  . Diverticulitis of colon 06/13/2007   S/p segmental colectomy for perforated diverticulitis   . Diverticulosis   . Facial pain 10/06/2015  . Fibromyalgia   . Foot sprain, left, initial encounter 08/31/2016  . Glossitis 02/02/2016  . Headache 04/24/2016  . HEMATURIA UNSPECIFIED 06/25/2008   Qualifier: Diagnosis of  By: TSherren MochaMD, JJory Ee  . HEMATURIA, HX OF 11/14/2009   Qualifier: Diagnosis of  By: VRegino SchultzeCMA (AAMA), RApolonio Schneiders   . History of kidney stones   . History of kidney stones   . History of shingles 2012--  no residual pain  . Itching 10/13/2018  . Left eye pain 07/01/2018  . Left ureteral calculus   . LLQ pain 04/24/2016  . LOC OSTEOARTHROS NOT SPEC PRIM/SEC OTH [facility] 06/27/2009   Qualifier: Diagnosis of  By: TSherren MochaMD, JJory Ee  . Malignant neoplasm of upper-outer quadrant of right breast in [place], [place] positive [place] 11/17/2018  . MCI (mild cognitive impairment)   . Memory loss 12/21/2016  . Morbid obesity ([place] 11/09/2010  . Pain of right side of body  08/31/2016  . Personal history of chemotherapy   . Personal history of radiation therapy   . Pruritus 10/26/2016  . Renal disorder   . Right sided temporal headache 12/21/2016  . Tremor 12/21/2016  . Trigeminal neuralgia RIGHT  . Trigeminal neuralgia of right side of face 11/30/2010  . Trochanteric bursitis, right hip 10/26/2016  . Urgency of urination   . Weight loss 07/22/2017  . Zoster without complications 2/72/5366   Past Surgical History:  Procedure Laterality Date  . BREAST BIOPSY Right 11/11/2018  . BREAST LUMPECTOMY Right 11/28/2018  .  BREAST LUMPECTOMY WITH RADIOACTIVE SEED AND SENTINEL LYMPH NODE BIOPSY Right 11/28/2018   Procedure: RIGHT BREAST LUMPECTOMY WITH RADIOACTIVE SEED AND RIGHT AXILLARY SENTINEL LYMPH NODE BIOPSY;  Surgeon: Excell Seltzer, MD;  Location: [facility];  Service: General;  Laterality: Right;  . CEREBRAL MICROVASCULAR DECOMPRESSION  07-05-2006   RIGHT TRIGEMINAL NERVE  . CYSTOSCOPY/RETROGRADE/URETEROSCOPY/STONE EXTRACTION WITH BASKET  07/04/2012   Procedure: CYSTOSCOPY/RETROGRADE/URETEROSCOPY/STONE EXTRACTION WITH BASKET;  Surgeon: Claybon Jabs, MD;  Location[facility];  Service: Urology;  Laterality: Left;  . EXPLORATORY LAPAROTOMY/ RESECTION MID TO DISTAL SIGMOID AND PROXIMAL RECTUM/ END PROXIMAL SIGMOID COLOSTOMY  07-17-2006   PERFORATED DIVERTICULITIS WITH PERITONITIS  . gamma knife  05/17/2016   for trigeminal neuralgia, [facility], Dr Salomon Fick  . gamma knife  2019  . IR CV LINE INJECTION  10/27/2019  . PORTACATH PLACEMENT Left 11/28/2018   Procedure: INSERTION PORT-A-CATH WITH ULTRASOUND;  Surgeon: Excell Seltzer, MD;  Location: [facility];  Service: General;  Laterality: Left;  . RESECTION COLOSTOMY/ CLOSURE COLOSTOMY WITH COLOPROCTOSTOMY  02-20-2007  . TONSILLECTOMY  AS CHILD  . URETEROSCOPY  07/04/2012   Procedure: URETEROSCOPY;  Surgeon: Claybon Jabs, MD;  [facility];  Service: Urology;  Laterality: Left;  Marland Kitchen VAGINAL HYSTERECTOMY  1998   Partial  . WISDOM TOOTH EXTRACTION       ALLERGIES:  Allergies  Allergen Reactions  . Clindamycin Hcl Itching     CURRENT MEDICATIONS:  Outpatient Encounter Medications as of 04/28/2020  Medication Sig  . acetaminophen (TYLENOL) 500 MG tablet Take 1,000 mg by mouth every 6 (six) hours as needed.  Marland Kitchen exemestane (AROMASIN) 25 MG tablet Take 1 tablet (25 mg total) by mouth daily after breakfast.  . gabapentin (NEURONTIN) 600 MG tablet TAKE 2 TABLETS TID.  Marland Kitchen Vitamin D, Ergocalciferol, (DRISDOL) 1.25 MG (50000  UNIT) CAPS capsule Take 1 capsule (50,000 Units total) by mouth every 7 (seven) days.  . cyclobenzaprine (FLEXERIL) 10 MG tablet Take 1 tablet (10 mg total) by mouth 3 (three) times daily as needed for muscle spasms.  . DULoxetine (CYMBALTA) 60 MG capsule TAKE 1 CAPSULE BY MOUTH EVERY DAY (Patient taking differently: Take 60 mg by mouth daily. )  . traMADol (ULTRAM) 50 MG tablet Take 1-2 tablets (50-100 mg total) by mouth at bedtime as needed.  . [DISCONTINUED] tiZANidine (ZANAFLEX) 2 MG tablet Take 1-2 tablets (2-4 mg total) by mouth every 6 (six) hours as needed for muscle spasms.   No facility-administered encounter medications on file as of 04/28/2020.     ONCOLOGIC FAMILY HISTORY:  Family History  Problem Relation Age of Onset  . Heart disease Maternal Grandmother   . Diabetes Mother   . Breast cancer Maternal Aunt   . Lupus Cousin   . Colon cancer Neg Hx   . Esophageal cancer Neg Hx   . Rectal cancer Neg Hx   . Stomach cancer Neg Hx  GENETIC COUNSELING/TESTING: No  SOCIAL HISTORY:  Jalan Bodi is Widowed, lives alone in [place].Drinks wine occasionally, may drink a bottle over a couple days here and there.    PHYSICAL EXAMINATION:  Vital Signs:   Vitals:   04/28/20 1003  BP: (!) 141/72  Pulse: 62  Resp: 17  Temp: (!) 97.2 F (36.2 C)  SpO2: 100%   Filed Weights   04/28/20 1003  Weight: 187 lb 8 oz (85 kg)   General: Well-appearing female in no acute distress.     HEENT:  Sclerae anicteric.   Lymph: No cervical, supraclavicular, or infraclavicular lymphadenopathy noted on palpation.  Cardiovascular: Regular rate and rhythm. Respiratory: Clear, breathing non-labored.  GI: Abdomen soft and round; non-tender, non-distended. Bowel sounds normoactive. No hepatomegaly or masses Neuro: No focal deficits. Steady gait.  Psych: Mood and affect normal and appropriate for situation.  Extremities: No edema. MSK: No focal spinal tenderness to  palpation.  Full range of motion in bilateral upper extremities Skin: Warm and dry. Breast: pendulous breast left larger than right. S/p right lumpectomy and radiation with mild hyperpigmentation. Incisions well healed. No palpable mass in either breast or axilla that I could appreciate.   LABORATORY DATA:  CBC Latest Ref Rng & Units 04/28/2020 01/22/2020 11/20/2019  WBC 4.0 - 10.5 K/uL 4.3 4.6 4.8  Hemoglobin 12.0 - 15.0 g/dL 12.3 12.1 12.7  Hematocrit 36 - 46 % 38.7 37.7 40.2  Platelets 150 - 400 K/uL 241 230 227   CMP Latest Ref Rng & Units 04/28/2020 01/22/2020 11/20/2019  Glucose 70 - 99 mg/dL 88 93 126(H)  BUN 8 - 23 mg/dL 10 10 14   Creatinine 0.44 - 1.00 mg/dL 0.71 0.70 0.74  Sodium 135 - 145 mmol/L 141 143 139  Potassium 3.5 - 5.1 mmol/L 3.8 3.7 3.9  Chloride 98 - 111 mmol/L 105 106 105  CO2 22 - 32 mmol/L 30 29 27   Calcium 8.9 - 10.3 mg/dL 9.6 9.3 8.8(L)  Total Protein 6.5 - 8.1 g/dL 6.6 6.4(L) 6.7  Total Bilirubin 0.3 - 1.2 mg/dL 1.5(H) 1.1 0.7  Alkaline Phos 38 - 126 U/L 118 109 108  AST 15 - 41 U/L 17 20 19   ALT 0 - 44 U/L 7 14 9     DIAGNOSTIC IMAGING:  None for this visit.      ASSESSMENT AND PLAN:  Ms.. Surace is a pleasant 69 y.o. female with Stage  IA right breast invasive ductal carcinoma, ER+/PR+/HER2+, diagnosed in 11/2018, treated with lumpectomy, adjuvant chemotherapy, radiation therapy, and anti-estrogen therapy with Exemestane begining in 06/2019.  She presents to the Survivorship Clinic for our initial meeting and routine follow-up post-completion of treatment for breast cancer.    1. Stage IA right breast cancer:  Ms. Riebe has recovered well from definitive treatment for breast cancer. Exam shows mild right breast hyperpigmentation, other wise benign; no clinical concern for recurrence. She will follow-up with her medical oncologist, Dr. Burr Medico in 3-4 months with lab, history and physical exam per surveillance protocol.  She will continue her anti-estrogen therapy  with exemestane. Thus far, she is tolerating well, with minimal side effects. She was instructed to make Dr. Burr Medico or myself aware if she begins to experience any worsening side effects of the medication and I could see her back in clinic to help manage those side effects, as needed. Today, a comprehensive survivorship care plan and treatment summary was reviewed with the patient today detailing her breast cancer diagnosis, treatment course, potential late/long-term effects  of treatment, appropriate follow-up care with recommendations for the future, and patient education resources.  A copy of this summary, along with a letter will be sent to the patient's primary care provider via In Basket message after today's visit.    2. Skin flaking: she reports dry skin and flaking on her face including eyebrows which is bothersome. She has tried cetaphil and other OTC products. She has used vitamin E which appears to be working today as I do not see obvious facial dry skin or flaking. She is interested in dermatology referral which I have placed for her today.   3. Constipation: This is an ongoing issue, she uses colace and miralax daily. I encouraged her to increase to BID, add prunes, increase water consumption, and increase physical activity (walking) during periods of worsening constipation.   4. Bone health:  Given Ms. Wassink's age/history of breast cancer and her current treatment regimen including anti-estrogen therapy with exemestane, she is at risk for bone demineralization.  Her last DEXA scan was 07/2019, which showed osteopenia in the left hip. I encouraged her to continue calcium and vitamin D. She was encouraged to increase her weight-bearing activities.  She was given education on specific activities to promote bone health.  5. Cancer screening:  Due to Ms. Fingerhut's history and her age, she should receive screening for skin cancers, colon cancer, and gynecologic cancers.  The information and  recommendations are listed on the patient's comprehensive care plan/treatment summary and were reviewed in detail with the patient.    6. Health maintenance and wellness promotion: Ms. Gatchel was encouraged to consume 5-7 servings of fruits and vegetables per day. We reviewed the "Nutrition Rainbow" handout, as well as the handout "Take Control of Your Health and Reduce Your Cancer Risk" from the [place].  She was also encouraged to engage in moderate to vigorous exercise for 30 minutes per day most days of the week. We discussed the [employer] fitness program, which is designed for cancer survivors to help them become more physically fit after cancer treatments.  She was instructed to limit her alcohol consumption and continue to abstain from tobacco use. She declined the covid19 vaccine today, she is still thinking about it.    7. Support services/counseling: It is not uncommon for this period of the patient's cancer care trajectory to be one of many emotions and stressors.  We discussed an opportunity for her to participate in the next session of [facility] ("Finding Your New Normal") support group series designed for patients after they have completed treatment.   Ms. Openshaw was encouraged to take advantage of our many other support services programs, support groups, and/or counseling in coping with her new life as a cancer survivor after completing anti-cancer treatment.  She was offered support today through active listening and expressive supportive counseling.  She was given information regarding our available services and encouraged to contact me with any questions or for help enrolling in any of our support group/programs.    Dispo:   -Return to cancer center in 3-4 months  -Mammogram due in 11/2020  -Follow up with surgery as indicated  -Referral to dermatology  -She is welcome to return back to the Survivorship Clinic at any time; no additional follow-up needed at this time.    -Consider referral back to survivorship as a long-term survivor for continued surveillance  A total of (40) minutes of face-to-face time was spent with this patient with greater than 50% of that time  in counseling and care-coordination.   Cira Rue, NP Survivorship Program [facility] [phone]   Note: PRIMARY CARE PROVIDER Hoyt Koch, [place] [phone]

## 2020-04-27 ENCOUNTER — Inpatient Hospital Stay: Payer: Federal, State, Local not specified - PPO

## 2020-04-28 ENCOUNTER — Other Ambulatory Visit: Payer: Self-pay

## 2020-04-28 ENCOUNTER — Inpatient Hospital Stay: Payer: Federal, State, Local not specified - PPO | Attending: Hematology

## 2020-04-28 ENCOUNTER — Encounter: Payer: Self-pay | Admitting: Nurse Practitioner

## 2020-04-28 ENCOUNTER — Inpatient Hospital Stay (HOSPITAL_BASED_OUTPATIENT_CLINIC_OR_DEPARTMENT_OTHER): Payer: Federal, State, Local not specified - PPO | Admitting: Nurse Practitioner

## 2020-04-28 ENCOUNTER — Inpatient Hospital Stay: Payer: Federal, State, Local not specified - PPO

## 2020-04-28 VITALS — BP 141/72 | HR 62 | Temp 97.2°F | Resp 17 | Ht 66.0 in | Wt 187.5 lb

## 2020-04-28 DIAGNOSIS — C50411 Malignant neoplasm of upper-outer quadrant of right female breast: Secondary | ICD-10-CM | POA: Diagnosis not present

## 2020-04-28 DIAGNOSIS — Z9221 Personal history of antineoplastic chemotherapy: Secondary | ICD-10-CM | POA: Diagnosis not present

## 2020-04-28 DIAGNOSIS — Z79811 Long term (current) use of aromatase inhibitors: Secondary | ICD-10-CM | POA: Diagnosis not present

## 2020-04-28 DIAGNOSIS — Z170 Estrogen receptor positive status [ER+]: Secondary | ICD-10-CM

## 2020-04-28 DIAGNOSIS — Z803 Family history of malignant neoplasm of breast: Secondary | ICD-10-CM | POA: Insufficient documentation

## 2020-04-28 DIAGNOSIS — Z833 Family history of diabetes mellitus: Secondary | ICD-10-CM | POA: Diagnosis not present

## 2020-04-28 DIAGNOSIS — Z923 Personal history of irradiation: Secondary | ICD-10-CM | POA: Insufficient documentation

## 2020-04-28 DIAGNOSIS — M85852 Other specified disorders of bone density and structure, left thigh: Secondary | ICD-10-CM | POA: Insufficient documentation

## 2020-04-28 DIAGNOSIS — Z8249 Family history of ischemic heart disease and other diseases of the circulatory system: Secondary | ICD-10-CM | POA: Diagnosis not present

## 2020-04-28 DIAGNOSIS — Z79899 Other long term (current) drug therapy: Secondary | ICD-10-CM | POA: Diagnosis not present

## 2020-04-28 DIAGNOSIS — L989 Disorder of the skin and subcutaneous tissue, unspecified: Secondary | ICD-10-CM

## 2020-04-28 DIAGNOSIS — D509 Iron deficiency anemia, unspecified: Secondary | ICD-10-CM

## 2020-04-28 DIAGNOSIS — Z95828 Presence of other vascular implants and grafts: Secondary | ICD-10-CM

## 2020-04-28 DIAGNOSIS — K5900 Constipation, unspecified: Secondary | ICD-10-CM | POA: Insufficient documentation

## 2020-04-28 DIAGNOSIS — M8580 Other specified disorders of bone density and structure, unspecified site: Secondary | ICD-10-CM

## 2020-04-28 LAB — CMP (CANCER CENTER ONLY)
ALT: 7 U/L (ref 0–44)
AST: 17 U/L (ref 15–41)
Albumin: 3.4 g/dL — ABNORMAL LOW (ref 3.5–5.0)
Alkaline Phosphatase: 118 U/L (ref 38–126)
Anion gap: 6 (ref 5–15)
BUN: 10 mg/dL (ref 8–23)
CO2: 30 mmol/L (ref 22–32)
Calcium: 9.6 mg/dL (ref 8.9–10.3)
Chloride: 105 mmol/L (ref 98–111)
Creatinine: 0.71 mg/dL (ref 0.44–1.00)
GFR, Est AFR Am: >60 mL/min (ref >60)
GFR, Estimated: >60 mL/min (ref >60)
Glucose, Bld: 88 mg/dL (ref 70–99)
Potassium: 3.8 mmol/L (ref 3.5–5.1)
Sodium: 141 mmol/L (ref 135–145)
Total Bilirubin: 1.5 mg/dL — ABNORMAL HIGH (ref 0.3–1.2)
Total Protein: 6.6 g/dL (ref 6.5–8.1)

## 2020-04-28 LAB — CBC WITH DIFFERENTIAL (CANCER CENTER ONLY)
Abs Immature Granulocytes: 0.01 10*3/uL (ref 0.00–0.07)
Basophils Absolute: 0.1 10*3/uL (ref 0.0–0.1)
Basophils Relative: 1 %
Eosinophils Absolute: 0.2 10*3/uL (ref 0.0–0.5)
Eosinophils Relative: 4 %
HCT: 38.7 % (ref 36.0–46.0)
Hemoglobin: 12.3 g/dL (ref 12.0–15.0)
Immature Granulocytes: 0 %
Lymphocytes Relative: 35 %
Lymphs Abs: 1.5 10*3/uL (ref 0.7–4.0)
MCH: 28.7 pg (ref 26.0–34.0)
MCHC: 31.8 g/dL (ref 30.0–36.0)
MCV: 90.4 fL (ref 80.0–100.0)
Monocytes Absolute: 0.4 10*3/uL (ref 0.1–1.0)
Monocytes Relative: 10 %
Neutro Abs: 2.2 10*3/uL (ref 1.7–7.7)
Neutrophils Relative %: 50 %
Platelet Count: 241 10*3/uL (ref 150–400)
RBC: 4.28 MIL/uL (ref 3.87–5.11)
RDW: 14 % (ref 11.5–15.5)
WBC Count: 4.3 10*3/uL (ref 4.0–10.5)
nRBC: 0 % (ref 0.0–0.2)

## 2020-04-28 LAB — IRON AND TIBC
Iron: 150 ug/dL — ABNORMAL HIGH (ref 41–142)
Saturation Ratios: 42 % (ref 21–57)
TIBC: 353 ug/dL (ref 236–444)
UIBC: 203 ug/dL (ref 120–384)

## 2020-04-28 LAB — VITAMIN D 25 HYDROXY (VIT D DEFICIENCY, FRACTURES): Vit D, 25-Hydroxy: 61.09 ng/mL (ref 30–100)

## 2020-04-28 LAB — FERRITIN: Ferritin: 27 ng/mL (ref 11–307)

## 2020-04-28 MED ORDER — HEPARIN SOD (PORK) LOCK FLUSH 100 UNIT/ML IV SOLN
500.0000 [IU] | Freq: Once | INTRAVENOUS | Status: AC
  Administered 2020-04-28: 500 [IU]
  Filled 2020-04-28: qty 5

## 2020-04-28 MED ORDER — SODIUM CHLORIDE 0.9% FLUSH
10.0000 mL | Freq: Once | INTRAVENOUS | Status: AC
  Administered 2020-04-28: 10 mL
  Filled 2020-04-28: qty 10

## 2020-04-28 NOTE — Patient Instructions (Signed)

## 2020-05-06 ENCOUNTER — Encounter: Payer: Self-pay | Admitting: Nurse Practitioner

## 2020-05-13 ENCOUNTER — Encounter: Payer: Self-pay | Admitting: Orthopaedic Surgery

## 2020-05-21 ENCOUNTER — Encounter (HOSPITAL_COMMUNITY): Payer: Self-pay | Admitting: *Deleted

## 2020-05-21 ENCOUNTER — Other Ambulatory Visit: Payer: Self-pay

## 2020-05-21 ENCOUNTER — Ambulatory Visit (HOSPITAL_COMMUNITY)
Admission: EM | Admit: 2020-05-21 | Discharge: 2020-05-21 | Disposition: A | Discharge status: Home / Self Care | Payer: Federal, State, Local not specified - PPO | Attending: Urgent Care | Admitting: Urgent Care

## 2020-05-21 ENCOUNTER — Encounter: Payer: Self-pay | Admitting: Nurse Practitioner

## 2020-05-21 DIAGNOSIS — L299 Pruritus, unspecified: Secondary | ICD-10-CM | POA: Diagnosis not present

## 2020-05-21 MED ORDER — HYDROXYZINE HCL 25 MG PO TABS: 12.5000 mg | ORAL_TABLET | Freq: Three times a day (TID) | ORAL | 0 refills | Status: DC | PRN

## 2020-05-21 NOTE — ED Provider Notes (Signed)
[place]   MRN: [id number] DOB: 08-28-1951  Subjective:   Summer [name] is a 69 y.o. female presenting for acute onset of itching of the hands and feet.  Patient states that the only new exposure she can think of is new soap that she use from [employer].  Denies eating any new foods, starting new medications, exposure to poisonous plants, new cleaning products or detergents.  Denies doing any new yard work, no rashes.  Patient was treated for cancer, is in remission and has not had treatment since June.  She states she is only taking Tylenol, gabapentin and vitamin D.  None of these are new medications either.   No current facility-administered medications for this encounter.  Current Outpatient Medications:    acetaminophen (TYLENOL) 500 MG tablet, Take 1,000 mg by mouth every 6 (six) hours as needed., Disp: , Rfl:    gabapentin (NEURONTIN) 600 MG tablet, TAKE 2 TABLETS TID., Disp: 540 tablet, Rfl: 3   Vitamin D, Ergocalciferol, (DRISDOL) 1.25 MG (50000 UNIT) CAPS capsule, Take 1 capsule (50,000 Units total) by mouth every 7 (seven) days., Disp: 12 capsule, Rfl: 0   Allergies  Allergen Reactions   Clindamycin Hcl Itching    Past Medical History:  Diagnosis Date   Abdominal pain, left lower quadrant 11/09/2009   Qualifier: Diagnosis of  By: Valma Cava LPN, Nancy     Allergic rhinitis 08/26/2008   Qualifier: Diagnosis of  By: Sherren Mocha MD, Dellis Filbert A    Anemia, iron deficiency 11/21/2018   Arthritis shoulders and back   BACK PAIN, CHRONIC 06/13/2007   Qualifier: Diagnosis of  By: Sherren Mocha MD, Dellis Filbert A    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   Constipation 07/01/2018   Costochondritis 05/04/2014   Diverticulitis of colon 06/13/2007   S/p segmental colectomy for perforated diverticulitis    Diverticulosis    Facial pain 10/06/2015   Fibromyalgia    Foot sprain, left, initial encounter  08/31/2016   Glossitis 02/02/2016   Headache 04/24/2016   HEMATURIA UNSPECIFIED 06/25/2008   Qualifier: Diagnosis of  By: Sherren Mocha MD, Jory Ee    HEMATURIA, HX OF 11/14/2009   Qualifier: Diagnosis of  By: Regino Schultze CMA (AAMA), Rachel     History of kidney stones    History of kidney stones    History of shingles 2012--  no residual pain   Itching 10/13/2018   Left eye pain 07/01/2018   Left ureteral calculus    LLQ pain 04/24/2016   LOC OSTEOARTHROS NOT SPEC PRIM/SEC OTH [facility] SITE 06/27/2009   Qualifier: Diagnosis of  By: Sherren Mocha MD, Dellis Filbert A    Malignant neoplasm of upper-outer quadrant of right breast in female, estrogen receptor positive ([place]) 11/17/2018   MCI (mild cognitive impairment)    Memory loss 12/21/2016   Morbid obesity ([place]) 11/09/2010   Pain of right side of body 08/31/2016   Personal history of chemotherapy    Personal history of radiation therapy    Pruritus 10/26/2016   Renal disorder    Right sided temporal headache 12/21/2016   Tremor 12/21/2016   Trigeminal neuralgia RIGHT   Trigeminal neuralgia of right side of face 11/30/2010   Trochanteric bursitis, right hip 10/26/2016   Urgency of urination    Weight loss 07/22/2017   Zoster without complications 1/32/4401     Past Surgical History:  Procedure Laterality Date   BREAST BIOPSY Right 11/11/2018  BREAST LUMPECTOMY Right 11/28/2018   BREAST LUMPECTOMY WITH RADIOACTIVE SEED AND SENTINEL LYMPH NODE BIOPSY Right 11/28/2018   Procedure: RIGHT BREAST LUMPECTOMY WITH RADIOACTIVE SEED AND RIGHT AXILLARY SENTINEL LYMPH NODE BIOPSY;  Surgeon: Excell Seltzer, MD;  Location: [place];  Service: General;  Laterality: Right;   CEREBRAL MICROVASCULAR DECOMPRESSION  07-05-2006   RIGHT TRIGEMINAL NERVE   CYSTOSCOPY/RETROGRADE/URETEROSCOPY/STONE EXTRACTION WITH BASKET  07/04/2012   Procedure: CYSTOSCOPY/RETROGRADE/URETEROSCOPY/STONE EXTRACTION WITH BASKET;  Surgeon: Claybon Jabs, MD;  Location: [facility];  Service: Urology;  Laterality: Left;   EXPLORATORY LAPAROTOMY/ RESECTION MID TO DISTAL SIGMOID AND PROXIMAL RECTUM/ END PROXIMAL SIGMOID COLOSTOMY  07-17-2006   PERFORATED DIVERTICULITIS WITH PERITONITIS   gamma knife  05/17/2016   for trigeminal neuralgia, [facility], Dr Salomon Fick   gamma knife  2019   IR CV LINE INJECTION  10/27/2019   PORTACATH PLACEMENT Left 11/28/2018   Procedure: INSERTION PORT-A-CATH WITH ULTRASOUND;  Surgeon: Excell Seltzer, MD;  Location: [place];  Service: General;  Laterality: Left;   portacath removal     RESECTION COLOSTOMY/ CLOSURE COLOSTOMY WITH COLOPROCTOSTOMY  02-20-2007   TONSILLECTOMY  AS CHILD   URETEROSCOPY  07/04/2012   Procedure: URETEROSCOPY;  Surgeon: Claybon Jabs, MD;  Location: [facility];  Service: Urology;  Laterality: Left;   VAGINAL HYSTERECTOMY  1998   Partial   WISDOM TOOTH EXTRACTION      Family History  Problem Relation Age of Onset   Heart disease Maternal Grandmother    Diabetes Mother    Breast cancer Maternal Aunt    Lupus Cousin    Colon cancer Neg Hx    Esophageal cancer Neg Hx    Rectal cancer Neg Hx    Stomach cancer Neg Hx     Social History   Tobacco Use   Smoking status: Never Smoker   Smokeless tobacco: Never Used  Vaping Use   Vaping Use: Never used  Substance Use Topics   Alcohol use: Yes    Comment: rarely   Drug use: No    ROS   Objective:   Vitals: BP (!) 143/67    Pulse 69    Temp 98.3 F (36.8 C)    Resp 16    SpO2 100%   Physical Exam Constitutional:      General: She is not in acute distress.    Appearance: Normal appearance. She is well-developed. She is not ill-appearing or toxic-appearing.  HENT:     Head: Normocephalic and atraumatic.     Nose: Nose normal.     Mouth/Throat:     Mouth: Mucous membranes are moist.     Pharynx: Oropharynx is clear.  Eyes:     General: No scleral icterus.       Right eye: No discharge.         Left eye: No discharge.     Extraocular Movements: Extraocular movements intact.     Conjunctiva/sclera: Conjunctivae normal.     Pupils: Pupils are equal, round, and reactive to light.  Cardiovascular:     Rate and Rhythm: Normal rate.  Pulmonary:     Effort: Pulmonary effort is normal.  Skin:    General: Skin is warm and dry.     Coloration: Skin is not jaundiced or pale.     Findings: No bruising, erythema, lesion or rash.  Neurological:     General: No focal deficit present.     Mental Status: She is alert and oriented to person,  place, and time.  Psychiatric:        Mood and Affect: Mood normal.        Behavior: Behavior normal.        Thought Content: Thought content normal.        Judgment: Judgment normal.       Assessment and Plan :   PDMP not reviewed this encounter.  1. Pruritus     Recommended patient stop using a new soap.  Use hydroxyzine for itching. Counseled patient on potential for adverse effects with medications prescribed/recommended today, ER and return-to-clinic precautions discussed, patient verbalized understanding.    Jaynee Eagles, PA-C 05/21/20 1723

## 2020-05-21 NOTE — ED Triage Notes (Signed)
Pt c/o waking up with pruritis to palms of hands and soles of feet this AM.

## 2020-05-22 ENCOUNTER — Encounter: Payer: Self-pay | Admitting: Internal Medicine

## 2020-05-22 ENCOUNTER — Encounter: Payer: Self-pay | Admitting: Hematology

## 2020-05-23 ENCOUNTER — Other Ambulatory Visit: Payer: Self-pay

## 2020-05-25 ENCOUNTER — Encounter: Payer: Self-pay | Admitting: Internal Medicine

## 2020-05-25 DIAGNOSIS — H9193 Unspecified hearing loss, bilateral: Secondary | ICD-10-CM

## 2020-05-26 ENCOUNTER — Telehealth: Payer: Self-pay | Admitting: Internal Medicine

## 2020-05-26 NOTE — Telephone Encounter (Signed)
Patient is calling and is requesting a TOC from Dr. Sharlet Salina to Dr. Bryan Lemma, please advise. CB is [phone]

## 2020-05-26 NOTE — Telephone Encounter (Signed)
With 2 providers leaving in the past 6 mo and therefore taking on many new patients, along with my part-time schedule, I am unfortunately not able to accommodate this request.  Doylene Canard, maybe Baldo Ash or Dr. Ethelene Hal can accommodate?

## 2020-05-26 NOTE — Telephone Encounter (Signed)
To pcc 's please

## 2020-05-30 NOTE — Telephone Encounter (Signed)
LVM for patient to call back to ask if patient wanted to consider Dr. Ethelene Hal or Baldo Ash to request a [facility] appointment with.

## 2020-06-02 IMAGING — CT CT HEAR MORPH WITH CTA COR WITH SCORE WITH CA WITH CONTRAST AND
4 of 7 series · 8 of 20 positions shown, 9 images · IV contrast (APPLIED)
Comparison: None.
COMPARISON: None.

Addendum:
EXAM:
OVER-READ INTERPRETATION  CT CHEST

The following report is an over-read performed by radiologist Dr.
Doha Friha [REDACTED] on 03/09/2019. This
over-read does not include interpretation of cardiac or coronary
anatomy or pathology. The coronary calcium score/coronary CTA
interpretation by the cardiologist is attached.
HISTORY: Chest Pain
Cardiac/Coronary  CT
TECHNIQUE: The patient was scanned on a Siemens Force scanner.
PROTOCOL: A 120 kV prospective scan was triggered in the descending thoracic
aorta at 111 HU's. Axial non-contrast 3 mm slices were carried out
through the heart. The data set was analyzed on a dedicated work
station and scored using the Agatson method. Gantry rotation speed
was 250 msecs and collimation was .6 mm. Beta blockade and 0.8 mg of
sl NTG was given. The 3D data set was reconstructed in 5% intervals
of the 67-82 % of the R-R cycle. Diastolic phases were analyzed on a
dedicated work station using MPR, MIP and VRT modes. The patient
received 100mL OMNIPAQUE IOHEXOL 350 MG/ML SOLN of contrast.

[Series 8: best diast 75 % · axial · 0.32mm/px · z∈[+1230,+1276]mm · 2 of 346 slices shown, 3 images]
[im 116/346  vessel]
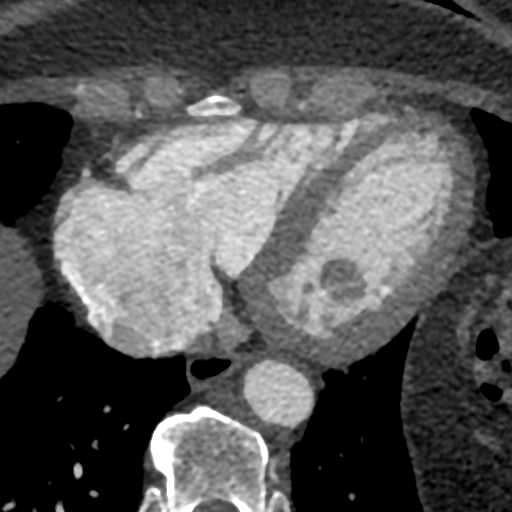
[im 116/346  lung]
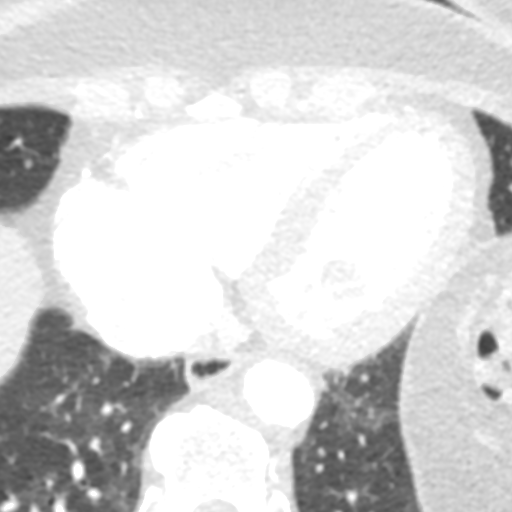
[im 231/346  vessel]
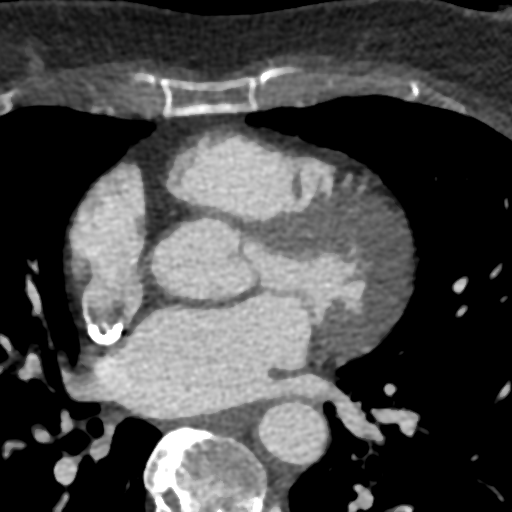

[Series 9: best syst 41 % · axial · 0.32mm/px · z∈[+1230,+1276]mm · 2 of 346 slices shown]
[im 116/346  vessel]
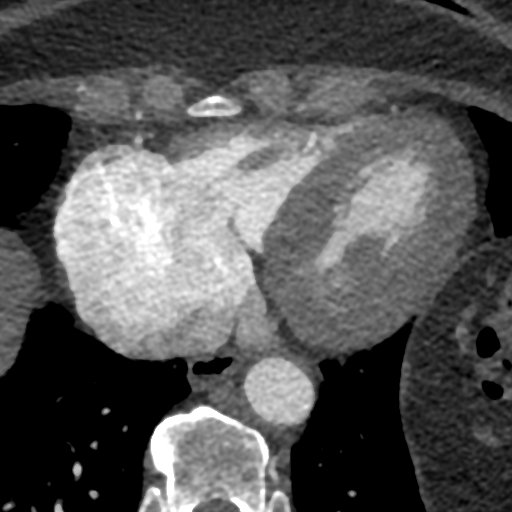
[im 231/346  vessel]
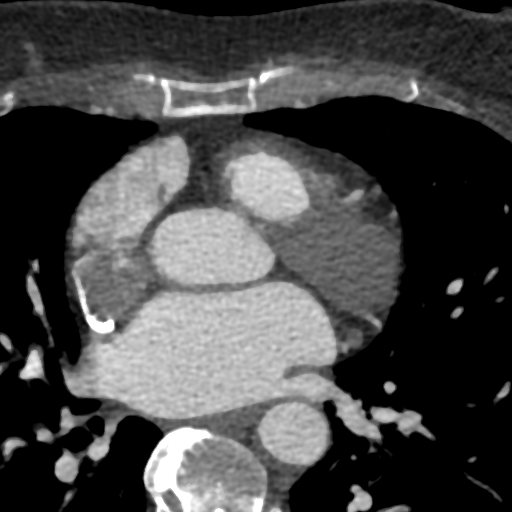

[Series 10: ts diast sharp 75 % · axial · 0.32mm/px · z∈[+1230,+1276]mm · 2 of 346 slices shown]
[im 116/346  lung]
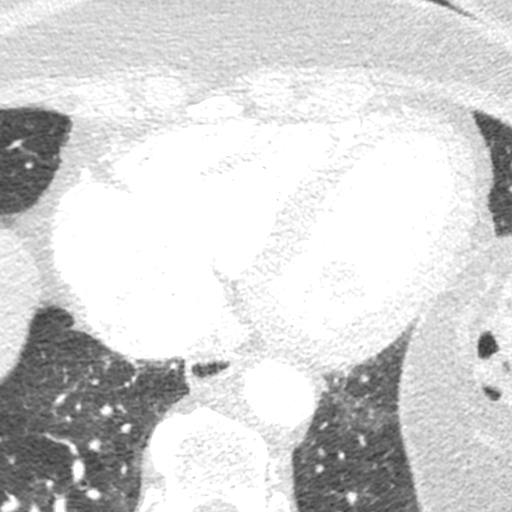
[im 231/346  lung]
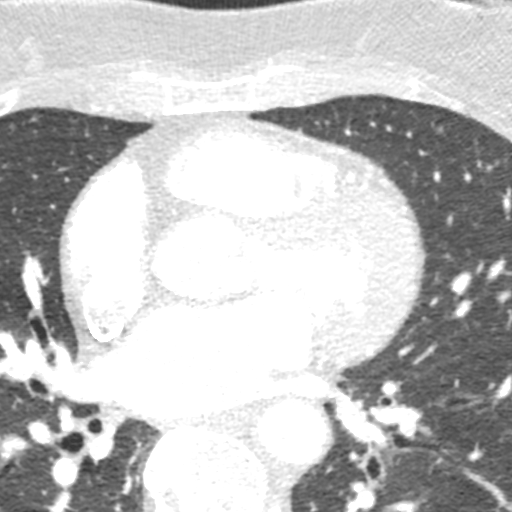

[Series 11: ts syst sharp 41 % · axial · 0.32mm/px · z∈[+1230,+1276]mm · 2 of 346 slices shown]
[im 116/346  lung]
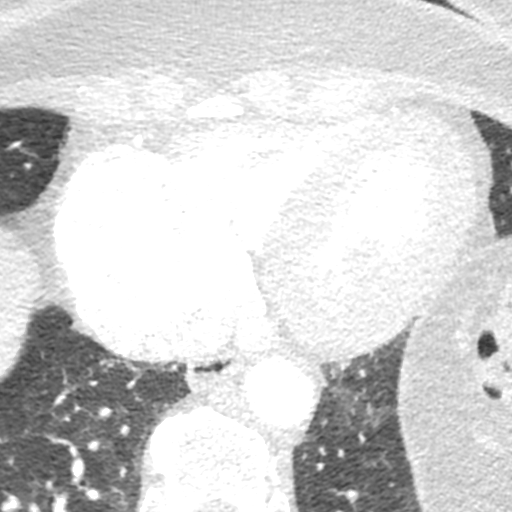
[im 231/346  lung]
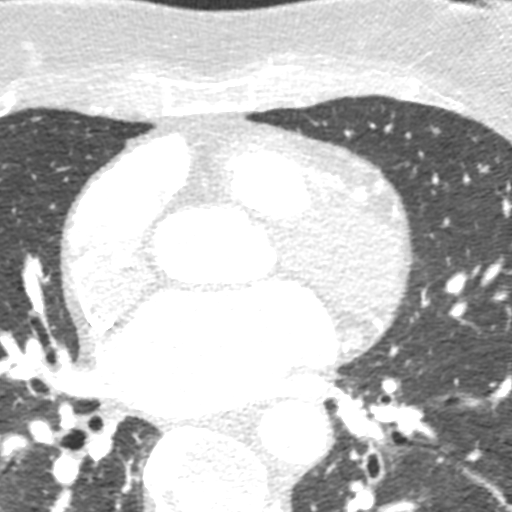

[8 of 20 positions shown; findings below may reference images not displayed]

FINDINGS: Tip of Port-A-Cath at superior cavoatrial junction. Within the
visualized portions of the thorax there are no suspicious appearing
pulmonary nodules or masses, there is no acute consolidative
airspace disease, no pleural effusions, no pneumothorax and no
lymphadenopathy. Visualized portions of the upper abdomen are
unremarkable. There are no aggressive appearing lytic or blastic
lesions noted in the visualized portions of the skeleton.
IMPRESSION: No significant incidental noncardiac findings are noted.
FINDINGS: Coronary calcium score: The patient's coronary artery calcium score
is 0.

Coronary arteries: Normal coronary origins.  Right dominance.

Right Coronary Artery: No detectable plaque or stenosis.

Left Main Coronary Artery: No detectable plaque or stenosis.

Left Anterior Descending Coronary Artery: No detectable plaque or
stenosis.

Left Circumflex Artery: No detectable plaque or stenosis.

Aorta: Normal size, 28 mm at the mid ascending aorta (level of the
PA bifurcation) measured double oblique. No calcifications. No
dissection.

Aortic Valve: No calcifications.

Other findings:

Normal pulmonary vein drainage into the left atrium.

Normal left atrial appendage without a thrombus.

Normal size of the pulmonary artery.

Port-A-Cath in SVC
IMPRESSION: 1. No evidence of CAD, CADRADS = 0.

2. Coronary calcium score of 0. This was 0 percentile for age and
sex matched control.

3. Normal coronary origin with right dominance.

*** End of Addendum ***
EXAM:
OVER-READ INTERPRETATION  CT CHEST

The following report is an over-read performed by radiologist Dr.
Doha Friha [REDACTED] on 03/09/2019. This
over-read does not include interpretation of cardiac or coronary
anatomy or pathology. The coronary calcium score/coronary CTA
interpretation by the cardiologist is attached.
FINDINGS: Tip of Port-A-Cath at superior cavoatrial junction. Within the
visualized portions of the thorax there are no suspicious appearing
pulmonary nodules or masses, there is no acute consolidative
airspace disease, no pleural effusions, no pneumothorax and no
lymphadenopathy. Visualized portions of the upper abdomen are
unremarkable. There are no aggressive appearing lytic or blastic
lesions noted in the visualized portions of the skeleton.
IMPRESSION: No significant incidental noncardiac findings are noted.

## 2020-06-06 DIAGNOSIS — H903 Sensorineural hearing loss, bilateral: Secondary | ICD-10-CM | POA: Insufficient documentation

## 2020-06-10 ENCOUNTER — Encounter: Payer: Self-pay | Admitting: Internal Medicine

## 2020-06-10 NOTE — Progress Notes (Signed)
Your procedure is scheduled on Monday, June 20, 2020.  Report to [facility] Main Entrance "A" at 5:30 A.M., and check in at the Admitting office.  Call this number if you have problems the morning of surgery:  [phone]  Call [phone] if you have any questions prior to your surgery date Monday-Friday 8am-4pm    Remember:  Do not eat after midnight the night before your surgery  You may drink clear liquids until 4:30 AM the morning of your surgery.   Clear liquids allowed are: Water, Non-Citrus Juices (without pulp), Carbonated Beverages, Clear Tea, Black Coffee Only, and Gatorade   Enhanced Recovery after Surgery for Orthopedics Enhanced Recovery after Surgery is a protocol used to improve the stress on your body and your recovery after surgery.  Patient Instructions  . The night before surgery:  o No food after midnight. ONLY clear liquids after midnight  .  Marland Kitchen The day of surgery (if you do NOT have diabetes):  o Drink ONE (1) Pre-Surgery Clear Ensure by 4:30 AM the morning of surgery   o This drink was given to you during your hospital  pre-op appointment visit. o Nothing else to drink after completing the  Pre-Surgery Clear Ensure.     Take these medicines the morning of surgery with A SIP OF WATER:  exemestane (AROMASIN) gabapentin (NEURONTIN) acetaminophen (TYLENOL) - if needed diphenhydrAMINE (BENADRYL) - if needed  As of today, STOP taking any Aspirin (unless otherwise instructed by your surgeon) Aleve, Naproxen, Ibuprofen, Motrin, Advil, Goody's, BC's, all herbal medications, fish oil, and all vitamins.                      Do not wear jewelry, make up, or nail polish            Do not wear lotions, powders, perfumes, or deodorant.            Do not shave 48 hours prior to surgery.            Do not bring valuables to the hospital.            [facility] is not responsible for any belongings or valuables.  Do NOT Smoke (Tobacco/Vaping) or drink  Alcohol 24 hours prior to your procedure If you use a CPAP at night, you may bring all equipment for your overnight stay.   Contacts, glasses, dentures or bridgework may not be worn into surgery.      For patients admitted to the hospital, discharge time will be determined by your treatment team.   Patients discharged the day of surgery will not be allowed to drive home, and someone needs to stay with them for 24 hours.    Special instructions:   [facility]- Preparing For Surgery  Before surgery, you can play an important role. Because skin is not sterile, your skin needs to be as free of germs as possible. You can reduce the number of germs on your skin by washing with CHG (chlorahexidine gluconate) Soap before surgery.  CHG is an antiseptic cleaner which kills germs and bonds with the skin to continue killing germs even after washing.    Oral Hygiene is also important to reduce your risk of infection.  Remember - BRUSH YOUR TEETH THE MORNING OF SURGERY WITH YOUR REGULAR TOOTHPASTE  Please do not use if you have an allergy to CHG or antibacterial soaps. If your skin becomes reddened/irritated stop using the CHG.  Do not shave (including  legs and underarms) for at least 48 hours prior to first CHG shower. It is OK to shave your face.  Please follow these instructions carefully.   1. Shower the NIGHT BEFORE SURGERY and the MORNING OF SURGERY with CHG Soap.   2. If you chose to wash your hair, wash your hair first as usual with your normal shampoo.  3. After you shampoo, rinse your hair and body thoroughly to remove the shampoo.  4. Use CHG as you would any other liquid soap. You can apply CHG directly to the skin and wash gently with a scrungie or a clean washcloth.   5. Apply the CHG Soap to your body ONLY FROM THE NECK DOWN.  Do not use on open wounds or open sores. Avoid contact with your eyes, ears, mouth and genitals (private parts). Wash Face and genitals (private parts)  with  your normal soap.   6. Wash thoroughly, paying special attention to the area where your surgery will be performed.  7. Thoroughly rinse your body with warm water from the neck down.  8. DO NOT shower/wash with your normal soap after using and rinsing off the CHG Soap.  9. Pat yourself dry with a CLEAN TOWEL.  10. Wear CLEAN PAJAMAS to bed the night before surgery  11. Place CLEAN SHEETS on your bed the night of your first shower and DO NOT SLEEP WITH PETS.   Day of Surgery: Wear Clean/Comfortable clothing the morning of surgery Do not apply any deodorants/lotions.   Remember to brush your teeth WITH YOUR REGULAR TOOTHPASTE.   Please read over the following fact sheets that you were given.

## 2020-06-13 ENCOUNTER — Encounter (HOSPITAL_COMMUNITY)
Admission: RE | Admit: 2020-06-13 | Discharge: 2020-06-13 | Disposition: A | Discharge status: Home / Self Care | Payer: Federal, State, Local not specified - PPO | Source: Ambulatory Visit | Attending: Orthopaedic Surgery | Admitting: Orthopaedic Surgery

## 2020-06-13 ENCOUNTER — Encounter (HOSPITAL_COMMUNITY): Payer: Self-pay

## 2020-06-13 ENCOUNTER — Ambulatory Visit (HOSPITAL_COMMUNITY)
Admission: RE | Admit: 2020-06-13 | Discharge: 2020-06-13 | Disposition: A | Discharge status: Home / Self Care | Payer: Federal, State, Local not specified - PPO | Source: Ambulatory Visit | Attending: Physician Assistant | Admitting: Physician Assistant

## 2020-06-13 ENCOUNTER — Other Ambulatory Visit: Payer: Self-pay

## 2020-06-13 DIAGNOSIS — M1711 Unilateral primary osteoarthritis, right knee: Secondary | ICD-10-CM | POA: Diagnosis not present

## 2020-06-13 HISTORY — DX: Other complications of anesthesia, initial encounter: T88.59XA

## 2020-06-13 LAB — URINALYSIS, ROUTINE W REFLEX MICROSCOPIC
Bacteria, UA: NONE SEEN
Bilirubin Urine: NEGATIVE
Glucose, UA: NEGATIVE mg/dL
Ketones, ur: NEGATIVE mg/dL
Leukocytes,Ua: NEGATIVE
Nitrite: NEGATIVE
Protein, ur: NEGATIVE mg/dL
Specific Gravity, Urine: 1.018 (ref 1.005–1.030)
pH: 6 (ref 5.0–8.0)

## 2020-06-13 LAB — COMPREHENSIVE METABOLIC PANEL
ALT: 12 U/L (ref 0–44)
AST: 24 U/L (ref 15–41)
Albumin: 3.6 g/dL (ref 3.5–5.0)
Alkaline Phosphatase: 100 U/L (ref 38–126)
Anion gap: 8 (ref 5–15)
BUN: 9 mg/dL (ref 8–23)
CO2: 28 mmol/L (ref 22–32)
Calcium: 9.4 mg/dL (ref 8.9–10.3)
Chloride: 106 mmol/L (ref 98–111)
Creatinine, Ser: 0.84 mg/dL (ref 0.44–1.00)
GFR calc Af Amer: >60 mL/min (ref >60)
GFR calc non Af Amer: >60 mL/min (ref >60)
Glucose, Bld: 94 mg/dL (ref 70–99)
Potassium: 3.8 mmol/L (ref 3.5–5.1)
Sodium: 142 mmol/L (ref 135–145)
Total Bilirubin: 0.7 mg/dL (ref 0.3–1.2)
Total Protein: 6.9 g/dL (ref 6.5–8.1)

## 2020-06-13 LAB — CBC WITH DIFFERENTIAL/PLATELET
Abs Immature Granulocytes: 0.01 10*3/uL (ref 0.00–0.07)
Basophils Absolute: 0 10*3/uL (ref 0.0–0.1)
Basophils Relative: 1 %
Eosinophils Absolute: 0.2 10*3/uL (ref 0.0–0.5)
Eosinophils Relative: 4 %
HCT: 41.2 % (ref 36.0–46.0)
Hemoglobin: 12.6 g/dL (ref 12.0–15.0)
Immature Granulocytes: 0 %
Lymphocytes Relative: 44 %
Lymphs Abs: 1.9 10*3/uL (ref 0.7–4.0)
MCH: 28.9 pg (ref 26.0–34.0)
MCHC: 30.6 g/dL (ref 30.0–36.0)
MCV: 94.5 fL (ref 80.0–100.0)
Monocytes Absolute: 0.4 10*3/uL (ref 0.1–1.0)
Monocytes Relative: 9 %
Neutro Abs: 1.8 10*3/uL (ref 1.7–7.7)
Neutrophils Relative %: 42 %
Platelets: 249 10*3/uL (ref 150–400)
RBC: 4.36 MIL/uL (ref 3.87–5.11)
RDW: 13.1 % (ref 11.5–15.5)
WBC: 4.4 10*3/uL (ref 4.0–10.5)
nRBC: 0 % (ref 0.0–0.2)

## 2020-06-13 LAB — PROTIME-INR
INR: 1 (ref 0.8–1.2)
Prothrombin Time: 12.5 seconds (ref 11.4–15.2)

## 2020-06-13 LAB — APTT: aPTT: 25 seconds (ref 24–36)

## 2020-06-13 LAB — TYPE AND SCREEN
ABO/RH(D): O POS
Antibody Screen: NEGATIVE

## 2020-06-13 LAB — SURGICAL PCR SCREEN
MRSA, PCR: NEGATIVE
Staphylococcus aureus: NEGATIVE

## 2020-06-13 NOTE — Progress Notes (Signed)
PCP - Dr. Pricilla Holm Cardiologist - Denies Oncologist: Dr. Truitt Merle  PPM/ICD - Denies  Chest x-ray - 06/13/20 EKG - 06/13/20 Stress Test - N/A ECHO - 01/13/20 Cardiac Cath - N/A   Sleep Study - Denies  Patient denies being diabetic.   Blood Thinner Instructions: N/A Aspirin Instructions: N/A  ERAS Protcol - Yes PRE-SURGERY Ensure - Yes   COVID TEST- 06/17/20   Anesthesia review: Yes, abnormal EKG  Patient denies shortness of breath, fever, cough and chest pain at PAT appointment   All instructions explained to the patient, with a verbal understanding of the material. Patient agrees to go over the instructions while at home for a better understanding. Patient also instructed to self quarantine after being tested for COVID-19. The opportunity to ask questions was provided.

## 2020-06-14 NOTE — Anesthesia Preprocedure Evaluation (Addendum)
Anesthesia Evaluation  Patient identified by MRN, date of birth, ID band Patient awake    Reviewed: Patient's Chart, lab work & pertinent test results  History of Anesthesia Complications (+) PROLONGED EMERGENCE  Airway Mallampati: II  TM Distance: >3 FB Neck ROM: Full    Dental  (+) Teeth Intact   Pulmonary neg pulmonary ROS,    Pulmonary exam normal        Cardiovascular negative cardio ROS   Rhythm:Regular Rate:Normal     Neuro/Psych  Headaches, Trigeminal neuralgia  Neuromuscular disease negative psych ROS   GI/Hepatic Neg liver ROS, diverticulitis    Endo/Other  Morbid obesity  Renal/GU Renal diseaseRenal stones   Right breast cancer s/p lumpectomy    Musculoskeletal  (+) Arthritis , Osteoarthritis,  Fibromyalgia -  Abdominal (+)  Abdomen: soft. Bowel sounds: normal.  Peds  Hematology  (+) anemia ,   Anesthesia Other Findings   Reproductive/Obstetrics                          Anesthesia Physical Anesthesia Plan  ASA: II  Anesthesia Plan: Spinal, Regional and MAC   Post-op Pain Management:  Regional for Post-op pain   Induction: Intravenous  PONV Risk Score and Plan: 2 and Ondansetron, Dexamethasone and Propofol infusion  Airway Management Planned: Simple Face Mask and Nasal Cannula  Additional Equipment: None  Intra-op Plan:   Post-operative Plan:   Informed Consent: I have reviewed the patients History and Physical, chart, labs and discussed the procedure including the risks, benefits and alternatives for the proposed anesthesia with the patient or authorized representative who has indicated his/her understanding and acceptance.       Plan Discussed with:   Anesthesia Plan Comments: (PAT note written 06/14/2020 by Myra Gianotti, PA-C. Lab Results      Component                Value               Date                      WBC                      4.4                  06/13/2020                HGB                      12.6                06/13/2020                HCT                      41.2                06/13/2020                MCV                      94.5                06/13/2020                PLT  249                 06/13/2020            ECHO 05/21: IMPRESSIONS  1. Left ventricular ejection fraction, by estimation, is 55 to 60%. The  left ventricle has normal function. The left ventricle has no regional  wall motion abnormalities. Left ventricular diastolic parameters were  normal. The average left ventricular  global longitudinal strain is -19.3 %.  2. Right ventricular systolic function is normal. The right ventricular  size is normal. There is normal pulmonary artery systolic pressure.  3. The mitral valve is normal in structure. Trivial mitral valve  regurgitation. No evidence of mitral stenosis.  4. The aortic valve is normal in structure. Aortic valve regurgitation is  trivial. No aortic stenosis is present.  5. The inferior vena cava is normal in size with greater than 50%  respiratory variability, suggesting right atrial pressure of 3 mmHg. )     Anesthesia Quick Evaluation

## 2020-06-14 NOTE — Progress Notes (Addendum)
Anesthesia Chart Review:  Case: [id number] Date/Time: 06/20/20 0700   Procedure: LEFT TOTAL KNEE ARTHROPLASTY (Left Knee)   Anesthesia type: Spinal   Pre-op diagnosis: left knee degenerative joint disease   Location: [facility] OR ROOM 04 / [place] [facility]   Surgeons: Leandrew Koyanagi, MD      DISCUSSION: Patient is a 69 year old female scheduled for the above procedure.  History includes never smoker, right breast cancer ( s/p left Port-a-Cath & right breast lumpectomy/axillary sentinel LN biopsy 11/28/18, c/p chemotherapy 01/02/19-02/12/20, radiation 04/09/19-05/28/19), chronic facial pain (right trigeminal neuralgia, s/p microvascular decompression [facility] ~ 2006, Dr. Harrison Mons), diverticulitis (s/p sigmoid colectomy/colostomy 07/17/16 for perforated diverticulitis, s/p colostomy takedown 02/20/07), chronic back pain, tremor, borderline glaucoma, fibromyalgia, mild cognitive impairment/memory loss, arthritis, nephrolithiasis, iron deficiency anemia.  + COVID-19 test 10/28/19.  She reported some "difficulty waking up" after anesthesia.  BMI is consistent with obesity.  She denied shortness of breath, cough, fever, chest pain at PAT RN visit.  She had normal LVEF on 01/13/2020 echo.  June 2020 coronary CT showed no CAD with calcium score of 0.  Presurgical COVID-19 test is scheduled for 06/17/2020.  Anesthesia team to evaluate on the day of surgery.   VS: BP 136/62   Pulse 69   Temp 37 C (Oral)   Resp 17   Ht 5\' 6"  (1.676 m)   Wt 87.1 kg   SpO2 100%   BMI 31.01 kg/m     PROVIDERS: Hoyt Koch, MD is PCP. She is aware of surgery plans per 02/26/20 office note. Truitt Merle, MD is HEM-ONC. Last visit 04/28/20 with Cira Rue, NP. Gery Pray, MD is RAD-ONC - Bo Merino, MD is rheumatologist - Fransico Him, MD is cardiologist. She was evaluated in June 2020 for chest pain and for chemotherapy (including Herceptin). She had a Calcium score of 0 with no CAD by CT coronary. LVEF normal per last echo  on 01/13/20.  Silvano Rusk, MD is GI - Marcial Pacas, MD is neurologist   LABS: Labs reviewed: Acceptable for surgery. (all labs ordered are listed, but only abnormal results are displayed)  Labs Reviewed  URINALYSIS, ROUTINE W REFLEX MICROSCOPIC - Abnormal; Notable for the following components:      Result Value   Hgb urine dipstick MODERATE (*)    All other components within normal limits  SURGICAL PCR SCREEN  APTT  CBC WITH DIFFERENTIAL/PLATELET  COMPREHENSIVE METABOLIC PANEL  PROTIME-INR  TYPE AND SCREEN     IMAGES: CXR 06/13/20: FINDINGS: Interval removal of left-sided Port-A-Cath. Stable cardiomediastinal contours. No focal airspace consolidation, pleural effusion, or pneumothorax. Osseous structures intact. Degenerative changes noted to the left shoulder. IMPRESSION: No active cardiopulmonary disease.   EKG: 06/13/20: Normal sinus rhythm Left axis deviation Incomplete right bundle branch block Moderate voltage criteria for LVH, may be normal variant ( R in aVL , Cornell product ) Septal infarct , age undetermined Abnormal ECG HR is slower , otherwise No significant change since last tracing Confirmed by Mertie Moores [phone]) on 06/13/2020 6:51:51 PM   CV: Echo 01/13/20: IMPRESSIONS  1. Left ventricular ejection fraction, by estimation, is 55 to 60%. The  left ventricle has normal function. The left ventricle has no regional  wall motion abnormalities. Left ventricular diastolic parameters were  normal. The average left ventricular  global longitudinal strain is -19.3 %.  2. Right ventricular systolic function is normal. The right ventricular  size is normal. There is normal pulmonary artery systolic pressure.  3. The  mitral valve is normal in structure. Trivial mitral valve  regurgitation. No evidence of mitral stenosis.  4. The aortic valve is normal in structure. Aortic valve regurgitation is  trivial. No aortic stenosis is present.  5. The inferior  vena cava is normal in size with greater than 50%  respiratory variability, suggesting right atrial pressure of 3 mmHg.   CT Coronary 03/09/19: IMPRESSION: 1. No evidence of CAD, CADRADS = 0. 2. Coronary calcium score of 0. This was 0 percentile for age and sex matched control. 3. Normal coronary origin with right dominance.   Past Medical History:  Diagnosis Date  . Abdominal pain, left lower quadrant 11/09/2009   Qualifier: Diagnosis of  By: Valma Cava LPN, Izora Gala    . Allergic rhinitis 08/26/2008   Qualifier: Diagnosis of  By: Sherren Mocha MD, Jory Ee   . Anemia, iron deficiency 11/21/2018  . Arthritis shoulders and back  . BACK PAIN, CHRONIC 06/13/2007   Qualifier: Diagnosis of  By: Sherren Mocha MD, Jory Ee   . 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 Complication of anesthesia    Pt states having some "difficulty waking up"  . Constipation 07/01/2018  . Costochondritis 05/04/2014  . Diverticulitis of colon 06/13/2007   S/p segmental colectomy for perforated diverticulitis   . Diverticulosis   . Facial pain 10/06/2015  . Fibromyalgia   . Foot sprain, left, initial encounter 08/31/2016  . Glossitis 02/02/2016  . Headache 04/24/2016  . HEMATURIA UNSPECIFIED 06/25/2008   Qualifier: Diagnosis of  By: Sherren Mocha MD, Jory Ee   . HEMATURIA, HX OF 11/14/2009   Qualifier: Diagnosis of  By: Regino Schultze CMA (AAMA), Apolonio Schneiders    . History of kidney stones   . History of kidney stones   . History of shingles 2012--  no residual pain  . Itching 10/13/2018  . Left eye pain 07/01/2018  . Left ureteral calculus   . LLQ pain 04/24/2016  . LOC OSTEOARTHROS NOT SPEC PRIM/SEC OTH [facility] SITE 06/27/2009   Qualifier: Diagnosis of  By: Sherren Mocha MD, Jory Ee   . Malignant neoplasm of upper-outer quadrant of right breast in female, estrogen receptor positive ([place]) 11/17/2018  . MCI (mild cognitive impairment)   . Memory loss 12/21/2016  . Morbid obesity ([place]) 11/09/2010  . Pain of right  side of body 08/31/2016  . Personal history of chemotherapy   . Personal history of radiation therapy   . Pruritus 10/26/2016  . Renal disorder   . Right sided temporal headache 12/21/2016  . Tremor 12/21/2016  . Trigeminal neuralgia RIGHT  . Trigeminal neuralgia of right side of face 11/30/2010  . Trochanteric bursitis, right hip 10/26/2016  . Urgency of urination   . Weight loss 07/22/2017  . Zoster without complications 6/76/1950    Past Surgical History:  Procedure Laterality Date  . BREAST BIOPSY Right 11/11/2018  . BREAST LUMPECTOMY Right 11/28/2018  . BREAST LUMPECTOMY WITH RADIOACTIVE SEED AND SENTINEL LYMPH NODE BIOPSY Right 11/28/2018   Procedure: RIGHT BREAST LUMPECTOMY WITH RADIOACTIVE SEED AND RIGHT AXILLARY SENTINEL LYMPH NODE BIOPSY;  Surgeon: Excell Seltzer, MD;  Location: [place];  Service: General;  Laterality: Right;  . CEREBRAL MICROVASCULAR DECOMPRESSION  07-05-2006   RIGHT TRIGEMINAL NERVE  . CYSTOSCOPY/RETROGRADE/URETEROSCOPY/STONE EXTRACTION WITH BASKET  07/04/2012   Procedure: CYSTOSCOPY/RETROGRADE/URETEROSCOPY/STONE EXTRACTION WITH BASKET;  Surgeon: Claybon Jabs, MD;  Location: [facility];  Service: Urology;  Laterality: Left;  . EXPLORATORY LAPAROTOMY/ RESECTION MID TO DISTAL  SIGMOID AND PROXIMAL RECTUM/ END PROXIMAL SIGMOID COLOSTOMY  07-17-2006   PERFORATED DIVERTICULITIS WITH PERITONITIS  . gamma knife  05/17/2016   for trigeminal neuralgia, [facility], Dr Salomon Fick  . gamma knife  2019  . IR CV LINE INJECTION  10/27/2019  . PORTACATH PLACEMENT Left 11/28/2018   Procedure: INSERTION PORT-A-CATH WITH ULTRASOUND;  Surgeon: Excell Seltzer, MD;  Location: [place];  Service: General;  Laterality: Left;  . portacath removal    . RESECTION COLOSTOMY/ CLOSURE COLOSTOMY WITH COLOPROCTOSTOMY  02-20-2007  . TONSILLECTOMY  AS CHILD  . URETEROSCOPY  07/04/2012   Procedure: URETEROSCOPY;  Surgeon: Claybon Jabs, MD;  Location: [facility];  Service: Urology;  Laterality: Left;  Marland Kitchen VAGINAL HYSTERECTOMY  1998   Partial  . WISDOM TOOTH EXTRACTION      MEDICATIONS: . acetaminophen (TYLENOL) 500 MG tablet  . augmented betamethasone dipropionate (DIPROLENE-AF) 0.05 % cream  . diphenhydrAMINE (BENADRYL) 25 MG tablet  . exemestane (AROMASIN) 25 MG tablet  . gabapentin (NEURONTIN) 600 MG tablet  . hydrOXYzine (ATARAX/VISTARIL) 25 MG tablet  . Vitamin D, Ergocalciferol, (DRISDOL) 1.25 MG (50000 UNIT) CAPS capsule   No current facility-administered medications for this encounter.     Myra Gianotti, PA-C Surgical Short Stay/Anesthesiology [facility] Phone [phone] [facility] Phone [phone] 06/14/2020 3:15 PM

## 2020-06-17 ENCOUNTER — Other Ambulatory Visit (HOSPITAL_COMMUNITY)
Admission: RE | Admit: 2020-06-17 | Discharge: 2020-06-17 | Disposition: A | Discharge status: Home / Self Care | Payer: Federal, State, Local not specified - PPO | Source: Ambulatory Visit | Attending: Orthopaedic Surgery | Admitting: Orthopaedic Surgery

## 2020-06-17 ENCOUNTER — Encounter: Payer: Self-pay | Admitting: Orthopaedic Surgery

## 2020-06-17 DIAGNOSIS — Z01818 Encounter for other preprocedural examination: Secondary | ICD-10-CM | POA: Insufficient documentation

## 2020-06-17 DIAGNOSIS — Z20822 Contact with and (suspected) exposure to covid-19: Secondary | ICD-10-CM | POA: Insufficient documentation

## 2020-06-17 LAB — SARS CORONAVIRUS 2 (TAT 6-24 HRS): SARS Coronavirus 2: NEGATIVE

## 2020-06-19 DIAGNOSIS — M1712 Unilateral primary osteoarthritis, left knee: Secondary | ICD-10-CM

## 2020-06-20 ENCOUNTER — Other Ambulatory Visit: Payer: Self-pay

## 2020-06-20 ENCOUNTER — Encounter (HOSPITAL_COMMUNITY)
Admission: RE | Disposition: A | Discharge status: Home / Self Care | Payer: Self-pay | Source: Home / Self Care | Attending: Orthopaedic Surgery

## 2020-06-20 ENCOUNTER — Observation Stay (HOSPITAL_COMMUNITY): Payer: Federal, State, Local not specified - PPO

## 2020-06-20 ENCOUNTER — Other Ambulatory Visit: Payer: Self-pay | Admitting: Physician Assistant

## 2020-06-20 ENCOUNTER — Encounter (HOSPITAL_COMMUNITY): Payer: Self-pay | Admitting: Orthopaedic Surgery

## 2020-06-20 ENCOUNTER — Ambulatory Visit (HOSPITAL_COMMUNITY): Payer: Federal, State, Local not specified - PPO | Admitting: Anesthesiology

## 2020-06-20 ENCOUNTER — Ambulatory Visit (HOSPITAL_COMMUNITY): Payer: Federal, State, Local not specified - PPO | Admitting: Vascular Surgery

## 2020-06-20 ENCOUNTER — Observation Stay (HOSPITAL_COMMUNITY)
Admission: RE | Admit: 2020-06-20 | Discharge: 2020-06-21 | Disposition: A | Discharge status: Home / Self Care | Payer: Federal, State, Local not specified - PPO | Attending: Orthopaedic Surgery | Admitting: Orthopaedic Surgery

## 2020-06-20 DIAGNOSIS — M797 Fibromyalgia: Secondary | ICD-10-CM | POA: Diagnosis not present

## 2020-06-20 DIAGNOSIS — M1712 Unilateral primary osteoarthritis, left knee: Secondary | ICD-10-CM | POA: Diagnosis not present

## 2020-06-20 DIAGNOSIS — Z853 Personal history of malignant neoplasm of breast: Secondary | ICD-10-CM | POA: Diagnosis not present

## 2020-06-20 DIAGNOSIS — Z96652 Presence of left artificial knee joint: Secondary | ICD-10-CM

## 2020-06-20 HISTORY — PX: TOTAL KNEE ARTHROPLASTY: SHX125

## 2020-06-20 SURGERY — ARTHROPLASTY, KNEE, TOTAL
Anesthesia: Regional | Site: Knee | Laterality: Left

## 2020-06-20 MED ORDER — SODIUM CHLORIDE 0.9 % IR SOLN
Status: DC | PRN
  Administered 2020-06-20: 3000 mL

## 2020-06-20 MED ORDER — DOCUSATE SODIUM 100 MG PO CAPS
100.0000 mg | ORAL_CAPSULE | Freq: Two times a day (BID) | ORAL | Status: DC
  Administered 2020-06-20 – 2020-06-21 (×2): 100 mg via ORAL
  Filled 2020-06-20 (×2): qty 1

## 2020-06-20 MED ORDER — CHLORHEXIDINE GLUCONATE 0.12 % MT SOLN
OROMUCOSAL | Status: AC
  Administered 2020-06-20: 15 mL via OROMUCOSAL
  Filled 2020-06-20: qty 15

## 2020-06-20 MED ORDER — EPHEDRINE 5 MG/ML INJ
INTRAVENOUS | Status: AC
  Filled 2020-06-20: qty 10

## 2020-06-20 MED ORDER — ALUM & MAG HYDROXIDE-SIMETH 200-200-20 MG/5ML PO SUSP: 30.0000 mL | ORAL | Status: DC | PRN

## 2020-06-20 MED ORDER — EPHEDRINE SULFATE-NACL 50-0.9 MG/10ML-% IV SOSY
PREFILLED_SYRINGE | INTRAVENOUS | Status: DC | PRN
  Administered 2020-06-20 (×2): 10 mg via INTRAVENOUS

## 2020-06-20 MED ORDER — DOCUSATE SODIUM 100 MG PO CAPS: 100.0000 mg | ORAL_CAPSULE | Freq: Every day | ORAL | 2 refills | Status: DC | PRN

## 2020-06-20 MED ORDER — ONDANSETRON HCL 4 MG PO TABS: 4.0000 mg | ORAL_TABLET | Freq: Four times a day (QID) | ORAL | Status: DC | PRN

## 2020-06-20 MED ORDER — LACTATED RINGERS IV SOLN: INTRAVENOUS | Status: DC

## 2020-06-20 MED ORDER — ONDANSETRON HCL 4 MG/2ML IJ SOLN
INTRAMUSCULAR | Status: DC | PRN
  Administered 2020-06-20: 4 mg via INTRAVENOUS

## 2020-06-20 MED ORDER — DEXAMETHASONE SODIUM PHOSPHATE 10 MG/ML IJ SOLN
10.0000 mg | Freq: Once | INTRAMUSCULAR | Status: AC
  Administered 2020-06-21: 10 mg via INTRAVENOUS
  Filled 2020-06-20: qty 1

## 2020-06-20 MED ORDER — ACETAMINOPHEN 10 MG/ML IV SOLN
INTRAVENOUS | Status: AC
  Filled 2020-06-20: qty 100

## 2020-06-20 MED ORDER — ACETAMINOPHEN 10 MG/ML IV SOLN
INTRAVENOUS | Status: DC | PRN
  Administered 2020-06-20: 1000 mg via INTRAVENOUS

## 2020-06-20 MED ORDER — OXYCODONE-ACETAMINOPHEN 5-325 MG PO TABS: 1.0000 | ORAL_TABLET | Freq: Four times a day (QID) | ORAL | 0 refills | Status: DC | PRN

## 2020-06-20 MED ORDER — HYDROMORPHONE HCL 1 MG/ML IJ SOLN
INTRAMUSCULAR | Status: AC
  Filled 2020-06-20: qty 1

## 2020-06-20 MED ORDER — VANCOMYCIN HCL 1000 MG IV SOLR
INTRAVENOUS | Status: AC
  Filled 2020-06-20: qty 1000

## 2020-06-20 MED ORDER — GABAPENTIN 600 MG PO TABS
1200.0000 mg | ORAL_TABLET | Freq: Three times a day (TID) | ORAL | Status: DC
  Administered 2020-06-20 – 2020-06-21 (×4): 1200 mg via ORAL
  Filled 2020-06-20 (×5): qty 2

## 2020-06-20 MED ORDER — FENTANYL CITRATE (PF) 250 MCG/5ML IJ SOLN
INTRAMUSCULAR | Status: AC
  Filled 2020-06-20: qty 5

## 2020-06-20 MED ORDER — PROPOFOL 10 MG/ML IV BOLUS
INTRAVENOUS | Status: AC
  Filled 2020-06-20: qty 20

## 2020-06-20 MED ORDER — CEFAZOLIN SODIUM-DEXTROSE 2-4 GM/100ML-% IV SOLN
2.0000 g | Freq: Four times a day (QID) | INTRAVENOUS | Status: AC
  Administered 2020-06-20 (×2): 2 g via INTRAVENOUS
  Filled 2020-06-20 (×2): qty 100

## 2020-06-20 MED ORDER — KETOROLAC TROMETHAMINE 15 MG/ML IJ SOLN
INTRAMUSCULAR | Status: AC
  Filled 2020-06-20: qty 1

## 2020-06-20 MED ORDER — ONDANSETRON HCL 4 MG/2ML IJ SOLN
INTRAMUSCULAR | Status: AC
  Filled 2020-06-20: qty 2

## 2020-06-20 MED ORDER — HYDROMORPHONE HCL 1 MG/ML IJ SOLN: 0.5000 mg | INTRAMUSCULAR | Status: DC | PRN

## 2020-06-20 MED ORDER — OXYCODONE HCL 5 MG/5ML PO SOLN: 5.0000 mg | Freq: Once | ORAL | Status: AC | PRN

## 2020-06-20 MED ORDER — CEFAZOLIN SODIUM-DEXTROSE 2-4 GM/100ML-% IV SOLN
INTRAVENOUS | Status: AC
  Filled 2020-06-20: qty 100

## 2020-06-20 MED ORDER — BUPIVACAINE IN DEXTROSE 0.75-8.25 % IT SOLN
INTRATHECAL | Status: DC | PRN
  Administered 2020-06-20: 1.6 mL via INTRATHECAL

## 2020-06-20 MED ORDER — MIDAZOLAM HCL 5 MG/5ML IJ SOLN
INTRAMUSCULAR | Status: DC | PRN
  Administered 2020-06-20: 2 mg via INTRAVENOUS

## 2020-06-20 MED ORDER — CEFAZOLIN SODIUM-DEXTROSE 2-4 GM/100ML-% IV SOLN
2.0000 g | INTRAVENOUS | Status: AC
  Administered 2020-06-20: 2 g via INTRAVENOUS

## 2020-06-20 MED ORDER — OXYCODONE HCL ER 10 MG PO T12A
10.0000 mg | EXTENDED_RELEASE_TABLET | Freq: Two times a day (BID) | ORAL | Status: DC
  Administered 2020-06-21: 10 mg via ORAL
  Filled 2020-06-20 (×2): qty 1

## 2020-06-20 MED ORDER — BUPIVACAINE LIPOSOME 1.3 % IJ SUSP
20.0000 mL | Freq: Once | INTRAMUSCULAR | Status: DC
  Filled 2020-06-20: qty 20

## 2020-06-20 MED ORDER — BUPIVACAINE-EPINEPHRINE 0.25% -1:200000 IJ SOLN
INTRAMUSCULAR | Status: DC | PRN
  Administered 2020-06-20: 10 mL
  Administered 2020-06-20: 20 mL

## 2020-06-20 MED ORDER — RIVAROXABAN 10 MG PO TABS: 10.0000 mg | ORAL_TABLET | Freq: Every day | ORAL | 0 refills | Status: DC

## 2020-06-20 MED ORDER — ACETAMINOPHEN 325 MG PO TABS: 325.0000 mg | ORAL_TABLET | Freq: Four times a day (QID) | ORAL | Status: DC | PRN

## 2020-06-20 MED ORDER — STERILE WATER FOR IRRIGATION IR SOLN
Status: DC | PRN
  Administered 2020-06-20: 1000 mL

## 2020-06-20 MED ORDER — TRANEXAMIC ACID 1000 MG/10ML IV SOLN
2000.0000 mg | INTRAVENOUS | Status: DC
  Filled 2020-06-20: qty 20

## 2020-06-20 MED ORDER — TRANEXAMIC ACID 1000 MG/10ML IV SOLN
INTRAVENOUS | Status: DC | PRN
  Administered 2020-06-20: 2000 mg via TOPICAL

## 2020-06-20 MED ORDER — ORAL CARE MOUTH RINSE: 15.0000 mL | Freq: Once | OROMUCOSAL | Status: AC

## 2020-06-20 MED ORDER — IRRISEPT - 450ML BOTTLE WITH 0.05% CHG IN STERILE WATER, USP 99.95% OPTIME
TOPICAL | Status: DC | PRN
  Administered 2020-06-20: 450 mL

## 2020-06-20 MED ORDER — LIDOCAINE 2% (20 MG/ML) 5 ML SYRINGE
INTRAMUSCULAR | Status: AC
  Filled 2020-06-20: qty 5

## 2020-06-20 MED ORDER — PHENOL 1.4 % MT LIQD: 1.0000 | OROMUCOSAL | Status: DC | PRN

## 2020-06-20 MED ORDER — ROPIVACAINE HCL 5 MG/ML IJ SOLN
INTRAMUSCULAR | Status: DC | PRN
  Administered 2020-06-20: 30 mL via EPIDURAL

## 2020-06-20 MED ORDER — OXYCODONE HCL 5 MG PO TABS: 5.0000 mg | ORAL_TABLET | ORAL | Status: DC | PRN

## 2020-06-20 MED ORDER — OXYCODONE HCL 5 MG PO TABS
ORAL_TABLET | ORAL | Status: AC
  Filled 2020-06-20: qty 1

## 2020-06-20 MED ORDER — DEXAMETHASONE SODIUM PHOSPHATE 10 MG/ML IJ SOLN
INTRAMUSCULAR | Status: DC | PRN
  Administered 2020-06-20: 10 mg via INTRAVENOUS

## 2020-06-20 MED ORDER — POLYETHYLENE GLYCOL 3350 17 G PO PACK: 17.0000 g | PACK | Freq: Every day | ORAL | Status: DC | PRN

## 2020-06-20 MED ORDER — POVIDONE-IODINE 10 % EX SWAB
2.0000 "application " | Freq: Once | CUTANEOUS | Status: AC
  Administered 2020-06-20: 2 via TOPICAL

## 2020-06-20 MED ORDER — LIDOCAINE HCL (CARDIAC) PF 50 MG/5ML IV SOSY
PREFILLED_SYRINGE | INTRAVENOUS | Status: DC | PRN
  Administered 2020-06-20: 40 mg via INTRAVENOUS

## 2020-06-20 MED ORDER — FENTANYL CITRATE (PF) 100 MCG/2ML IJ SOLN
INTRAMUSCULAR | Status: DC | PRN
  Administered 2020-06-20 (×2): 50 ug via INTRAVENOUS
  Administered 2020-06-20 (×2): 25 ug via INTRAVENOUS
  Administered 2020-06-20 (×2): 50 ug via INTRAVENOUS

## 2020-06-20 MED ORDER — PROPOFOL 500 MG/50ML IV EMUL
INTRAVENOUS | Status: DC | PRN
  Administered 2020-06-20: 50 ug/kg/min via INTRAVENOUS

## 2020-06-20 MED ORDER — 0.9 % SODIUM CHLORIDE (POUR BTL) OPTIME
TOPICAL | Status: DC | PRN
  Administered 2020-06-20: 1000 mL

## 2020-06-20 MED ORDER — DIPHENHYDRAMINE HCL 50 MG/ML IJ SOLN
12.5000 mg | Freq: Once | INTRAMUSCULAR | Status: AC
  Administered 2020-06-20: 12.5 mg via INTRAVENOUS

## 2020-06-20 MED ORDER — HYDROMORPHONE HCL 1 MG/ML IJ SOLN
0.2500 mg | INTRAMUSCULAR | Status: DC | PRN
  Administered 2020-06-20: 0.25 mg via INTRAVENOUS
  Administered 2020-06-20: 0.5 mg via INTRAVENOUS
  Administered 2020-06-20: 0.25 mg via INTRAVENOUS
  Administered 2020-06-20 (×2): 0.5 mg via INTRAVENOUS

## 2020-06-20 MED ORDER — ONDANSETRON HCL 4 MG/2ML IJ SOLN: 4.0000 mg | Freq: Once | INTRAMUSCULAR | Status: DC | PRN

## 2020-06-20 MED ORDER — RIVAROXABAN 10 MG PO TABS
10.0000 mg | ORAL_TABLET | Freq: Every day | ORAL | Status: DC
  Administered 2020-06-21: 10 mg via ORAL
  Filled 2020-06-20: qty 1

## 2020-06-20 MED ORDER — TRANEXAMIC ACID 1000 MG/10ML IV SOLN
2000.0000 mg | Freq: Once | INTRAVENOUS | Status: DC
  Filled 2020-06-20: qty 20

## 2020-06-20 MED ORDER — OXYCODONE HCL 5 MG PO TABS: 10.0000 mg | ORAL_TABLET | ORAL | Status: DC | PRN

## 2020-06-20 MED ORDER — BUPIVACAINE-EPINEPHRINE (PF) 0.25% -1:200000 IJ SOLN
INTRAMUSCULAR | Status: AC
  Filled 2020-06-20: qty 30

## 2020-06-20 MED ORDER — DIPHENHYDRAMINE HCL 12.5 MG/5ML PO ELIX: 25.0000 mg | ORAL_SOLUTION | ORAL | Status: DC | PRN

## 2020-06-20 MED ORDER — PROPOFOL 10 MG/ML IV BOLUS
INTRAVENOUS | Status: DC | PRN
  Administered 2020-06-20: 20 mg via INTRAVENOUS
  Administered 2020-06-20: 30 mg via INTRAVENOUS
  Administered 2020-06-20: 10 mg via INTRAVENOUS
  Administered 2020-06-20: 30 mg via INTRAVENOUS
  Administered 2020-06-20: 20 mg via INTRAVENOUS
  Administered 2020-06-20: 200 mg via INTRAVENOUS

## 2020-06-20 MED ORDER — MIDAZOLAM HCL 2 MG/2ML IJ SOLN
INTRAMUSCULAR | Status: AC
  Filled 2020-06-20: qty 2

## 2020-06-20 MED ORDER — SODIUM CHLORIDE 0.9% FLUSH
INTRAVENOUS | Status: DC | PRN
  Administered 2020-06-20: 20 mL

## 2020-06-20 MED ORDER — METOCLOPRAMIDE HCL 5 MG/ML IJ SOLN: 5.0000 mg | Freq: Three times a day (TID) | INTRAMUSCULAR | Status: DC | PRN

## 2020-06-20 MED ORDER — ACETAMINOPHEN 500 MG PO TABS
1000.0000 mg | ORAL_TABLET | Freq: Four times a day (QID) | ORAL | Status: AC
  Administered 2020-06-20 – 2020-06-21 (×3): 1000 mg via ORAL
  Filled 2020-06-20 (×3): qty 2

## 2020-06-20 MED ORDER — TRANEXAMIC ACID-NACL 1000-0.7 MG/100ML-% IV SOLN
1000.0000 mg | INTRAVENOUS | Status: AC
  Administered 2020-06-20: 1000 mg via INTRAVENOUS
  Filled 2020-06-20: qty 100

## 2020-06-20 MED ORDER — METHOCARBAMOL 500 MG PO TABS: 500.0000 mg | ORAL_TABLET | Freq: Two times a day (BID) | ORAL | 0 refills | Status: DC | PRN

## 2020-06-20 MED ORDER — ONDANSETRON HCL 4 MG/2ML IJ SOLN: 4.0000 mg | Freq: Four times a day (QID) | INTRAMUSCULAR | Status: DC | PRN

## 2020-06-20 MED ORDER — SORBITOL 70 % SOLN: 30.0000 mL | Freq: Every day | Status: DC | PRN

## 2020-06-20 MED ORDER — KETOROLAC TROMETHAMINE 15 MG/ML IJ SOLN
15.0000 mg | Freq: Four times a day (QID) | INTRAMUSCULAR | Status: AC
  Administered 2020-06-20 – 2020-06-21 (×4): 15 mg via INTRAVENOUS
  Filled 2020-06-20 (×3): qty 1

## 2020-06-20 MED ORDER — MENTHOL 3 MG MT LOZG: 1.0000 | LOZENGE | OROMUCOSAL | Status: DC | PRN

## 2020-06-20 MED ORDER — ONDANSETRON HCL 4 MG PO TABS: 4.0000 mg | ORAL_TABLET | Freq: Three times a day (TID) | ORAL | 0 refills | Status: DC | PRN

## 2020-06-20 MED ORDER — CHLORHEXIDINE GLUCONATE 0.12 % MT SOLN: 15.0000 mL | Freq: Once | OROMUCOSAL | Status: AC

## 2020-06-20 MED ORDER — VANCOMYCIN HCL 1000 MG IV SOLR
INTRAVENOUS | Status: DC | PRN
  Administered 2020-06-20: 1000 mg

## 2020-06-20 MED ORDER — DIPHENHYDRAMINE HCL 50 MG/ML IJ SOLN
INTRAMUSCULAR | Status: AC
  Filled 2020-06-20: qty 1

## 2020-06-20 MED ORDER — METOCLOPRAMIDE HCL 5 MG PO TABS: 5.0000 mg | ORAL_TABLET | Freq: Three times a day (TID) | ORAL | Status: DC | PRN

## 2020-06-20 MED ORDER — MAGNESIUM CITRATE PO SOLN: 1.0000 | Freq: Once | ORAL | Status: DC | PRN

## 2020-06-20 MED ORDER — BUPIVACAINE LIPOSOME 1.3 % IJ SUSP
INTRAMUSCULAR | Status: DC | PRN
  Administered 2020-06-20: 20 mL

## 2020-06-20 MED ORDER — OXYCODONE HCL 5 MG PO TABS
5.0000 mg | ORAL_TABLET | Freq: Once | ORAL | Status: AC | PRN
  Administered 2020-06-20: 5 mg via ORAL

## 2020-06-20 MED ORDER — EXEMESTANE 25 MG PO TABS
25.0000 mg | ORAL_TABLET | Freq: Every day | ORAL | Status: DC
  Administered 2020-06-20: 25 mg via ORAL
  Filled 2020-06-20 (×2): qty 1

## 2020-06-20 MED ORDER — SODIUM CHLORIDE 0.9 % IV SOLN: INTRAVENOUS | Status: DC

## 2020-06-20 SURGICAL SUPPLY — 82 items
ALCOHOL 70% 16 OZ (MISCELLANEOUS) ×3 IMPLANT
BAG DECANTER FOR FLEXI CONT (MISCELLANEOUS) ×3 IMPLANT
BANDAGE ESMARK 6X9 LF (GAUZE/BANDAGES/DRESSINGS) IMPLANT
BLADE SAG 18X100X1.27 (BLADE) ×3 IMPLANT
BNDG ELASTIC 4X5.8 VLCR STR LF (GAUZE/BANDAGES/DRESSINGS) IMPLANT
BNDG ELASTIC 6X5.8 VLCR STR LF (GAUZE/BANDAGES/DRESSINGS) IMPLANT
BNDG ESMARK 6X9 LF (GAUZE/BANDAGES/DRESSINGS)
BOWL SMART MIX CTS (DISPOSABLE) ×3 IMPLANT
CEMENT BONE REFOBACIN R1X40 US (Cement) ×6 IMPLANT
CLOSURE STERI-STRIP 1/2X4 (GAUZE/BANDAGES/DRESSINGS) ×2
CLSR STERI-STRIP ANTIMIC 1/2X4 (GAUZE/BANDAGES/DRESSINGS) ×4 IMPLANT
COVER SURGICAL LIGHT HANDLE (MISCELLANEOUS) ×3 IMPLANT
COVER WAND RF STERILE (DRAPES) IMPLANT
CUFF TOURN SGL QUICK 34 (TOURNIQUET CUFF) ×3
CUFF TOURN SGL QUICK 42 (TOURNIQUET CUFF) IMPLANT
CUFF TRNQT CYL 34X4.125X (TOURNIQUET CUFF) ×1 IMPLANT
DERMABOND ADVANCED (GAUZE/BANDAGES/DRESSINGS) ×2
DERMABOND ADVANCED .7 DNX12 (GAUZE/BANDAGES/DRESSINGS) ×1 IMPLANT
DRAPE EXTREMITY T 121X128X90 (DISPOSABLE) ×3 IMPLANT
DRAPE HALF SHEET 40X57 (DRAPES) ×3 IMPLANT
DRAPE INCISE IOBAN 66X45 STRL (DRAPES) IMPLANT
DRAPE ORTHO SPLIT 77X108 STRL (DRAPES) ×6
DRAPE POUCH INSTRU U-SHP 10X18 (DRAPES) ×3 IMPLANT
DRAPE SURG ORHT 6 SPLT 77X108 (DRAPES) ×2 IMPLANT
DRAPE U-SHAPE 47X51 STRL (DRAPES) ×6 IMPLANT
DRSG AQUACEL AG ADV 3.5X10 (GAUZE/BANDAGES/DRESSINGS) ×3 IMPLANT
DURAPREP 26ML APPLICATOR (WOUND CARE) ×9 IMPLANT
ELECT CAUTERY BLADE 6.4 (BLADE) ×3 IMPLANT
ELECT REM PT RETURN 9FT ADLT (ELECTROSURGICAL) ×3
ELECTRODE REM PT RTRN 9FT ADLT (ELECTROSURGICAL) ×1 IMPLANT
FEMORAL KNEE COMP SZ 8 STND LT (Knees) ×3 IMPLANT
FEMORAL KNEE COMP SZ 8STD LT (Knees) ×1 IMPLANT
GLOVE BIOGEL PI IND STRL 7.0 (GLOVE) ×1 IMPLANT
GLOVE BIOGEL PI INDICATOR 7.0 (GLOVE) ×2
GLOVE ECLIPSE 7.0 STRL STRAW (GLOVE) ×9 IMPLANT
GLOVE SKINSENSE NS SZ7.5 (GLOVE) ×6
GLOVE SKINSENSE STRL SZ7.5 (GLOVE) ×3 IMPLANT
GLOVE SURG SYN 7.5  E (GLOVE) ×12
GLOVE SURG SYN 7.5 E (GLOVE) ×4 IMPLANT
GOWN STRL REIN XL XLG (GOWN DISPOSABLE) ×3 IMPLANT
GOWN STRL REUS W/ TWL LRG LVL3 (GOWN DISPOSABLE) ×1 IMPLANT
GOWN STRL REUS W/TWL LRG LVL3 (GOWN DISPOSABLE) ×3
HANDPIECE INTERPULSE COAX TIP (DISPOSABLE) ×3
HOOD PEEL AWAY FLYTE STAYCOOL (MISCELLANEOUS) ×6 IMPLANT
JET LAVAGE IRRISEPT WOUND (IRRIGATION / IRRIGATOR) ×3
KIT BASIN OR (CUSTOM PROCEDURE TRAY) ×3 IMPLANT
KIT TURNOVER KIT B (KITS) ×3 IMPLANT
LAVAGE JET IRRISEPT WOUND (IRRIGATION / IRRIGATOR) ×1 IMPLANT
MANIFOLD NEPTUNE II (INSTRUMENTS) ×3 IMPLANT
MARKER SKIN DUAL TIP RULER LAB (MISCELLANEOUS) ×3 IMPLANT
NEEDLE SPNL 18GX3.5 QUINCKE PK (NEEDLE) ×6 IMPLANT
NS IRRIG 1000ML POUR BTL (IV SOLUTION) ×3 IMPLANT
PACK TOTAL JOINT (CUSTOM PROCEDURE TRAY) ×3 IMPLANT
PAD ARMBOARD 7.5X6 YLW CONV (MISCELLANEOUS) ×6 IMPLANT
PAD CAST 4YDX4 CTTN HI CHSV (CAST SUPPLIES) IMPLANT
PADDING CAST COTTON 4X4 STRL (CAST SUPPLIES)
PADDING CAST COTTON 6X4 STRL (CAST SUPPLIES) IMPLANT
SAW OSC TIP CART 19.5X105X1.3 (SAW) ×3 IMPLANT
SCREW FEMALE HEX FIX 25X2.5 (ORTHOPEDIC DISPOSABLE SUPPLIES) ×2 IMPLANT
SET HNDPC FAN SPRY TIP SCT (DISPOSABLE) ×1 IMPLANT
STAPLER VISISTAT 35W (STAPLE) IMPLANT
STEM POLY PAT PLY 32M KNEE (Knees) ×3 IMPLANT
STEM TIBIA 5 DEG SZ E L KNEE (Knees) ×1 IMPLANT
STEM TIBIAL 10 8-11 EF POLY LT (Joint) ×3 IMPLANT
SUCTION FRAZIER HANDLE 10FR (MISCELLANEOUS) ×3
SUCTION TUBE FRAZIER 10FR DISP (MISCELLANEOUS) ×1 IMPLANT
SUT ETHILON 2 0 FS 18 (SUTURE) IMPLANT
SUT MNCRL AB 3-0 PS2 27 (SUTURE) ×3 IMPLANT
SUT MNCRL AB 4-0 PS2 18 (SUTURE) IMPLANT
SUT VIC AB 0 CT1 27 (SUTURE) ×6
SUT VIC AB 0 CT1 27XBRD ANBCTR (SUTURE) ×2 IMPLANT
SUT VIC AB 1 CTX 27 (SUTURE) ×9 IMPLANT
SUT VIC AB 2-0 CT1 27 (SUTURE) ×12
SUT VIC AB 2-0 CT1 TAPERPNT 27 (SUTURE) ×4 IMPLANT
SYR 50ML LL SCALE MARK (SYRINGE) ×6 IMPLANT
TIBIA STEM 5 DEG SZ E L KNEE (Knees) ×3 IMPLANT
TOWEL GREEN STERILE (TOWEL DISPOSABLE) ×3 IMPLANT
TOWEL GREEN STERILE FF (TOWEL DISPOSABLE) ×3 IMPLANT
TRAY CATH 16FR W/PLASTIC CATH (SET/KITS/TRAYS/PACK) IMPLANT
UNDERPAD 30X36 HEAVY ABSORB (UNDERPADS AND DIAPERS) ×3 IMPLANT
WRAP KNEE MAXI GEL POST OP (GAUZE/BANDAGES/DRESSINGS) ×3 IMPLANT
persona knee system female hex screw ×3 IMPLANT

## 2020-06-20 NOTE — Anesthesia Procedure Notes (Signed)
Anesthesia Regional Block: Adductor canal block   Pre-Anesthetic Checklist: ,, timeout performed, Correct Patient, Correct Site, Correct Laterality, Correct Procedure, Correct Position, site marked, Risks and benefits discussed,  Surgical consent,  Pre-op evaluation,  At surgeon's request and post-op pain management  Laterality: Left  Prep: Dura Prep       Needles:  Injection technique: Single-shot  Needle Type: Echogenic Stimulator Needle      Needle Gauge: 20     Additional Needles:   Procedures:,,,, ultrasound used (permanent image in chart),,,,  Narrative:  Start time: 06/20/2020 7:00 AM End time: 06/20/2020 7:05 AM Injection made incrementally with aspirations every 5 mL.  Performed by: Personally  Anesthesiologist: Darral Dash, DO  Additional Notes: Patient identified. Risks/Benefits/Options discussed with patient including but not limited to bleeding, infection, nerve damage, failed block, incomplete pain control. Patient expressed understanding and wished to proceed. All questions were answered. Sterile technique was used throughout the entire procedure. Please see nursing notes for vital signs. Aspirated in 5cc intervals with injection for negative confirmation. Patient was given instructions on fall risk and not to get out of bed. All questions and concerns addressed with instructions to call with any issues or inadequate analgesia.

## 2020-06-20 NOTE — Anesthesia Postprocedure Evaluation (Signed)
Anesthesia Post Note  Patient: Summer [name]  Procedure(s) Performed: LEFT TOTAL KNEE ARTHROPLASTY (Left Knee)     Patient location during evaluation: PACU Anesthesia Type: Combined General/Spinal Level of consciousness: awake and alert Pain management: pain level controlled Vital Signs Assessment: post-procedure vital signs reviewed and stable Respiratory status: spontaneous breathing, nonlabored ventilation, respiratory function stable and patient connected to nasal cannula oxygen Cardiovascular status: blood pressure returned to baseline and stable Postop Assessment: no apparent nausea or vomiting Anesthetic complications: yes Comments: Prior to incision, patient with notable pain and temperature sensation with movement of operative lower extremity. Decision made to convert to [place] prior to procedure without complication.    No complications documented.  Last Vitals:  Vitals:   06/20/20 1236 06/20/20 1306  BP: 126/69   Pulse: 90 93  Resp: 13 12  Temp:    SpO2: 99% 99%    Last Pain:  Vitals:   06/20/20 1236  TempSrc:   PainSc: Asleep                 Belenda Cruise P Anaissa Macfadden

## 2020-06-20 NOTE — Anesthesia Procedure Notes (Signed)
Procedure Name: Intubation Date/Time: 06/20/2020 8:27 AM Performed by: Darletta Moll, CRNA Pre-anesthesia Checklist: Patient identified, Patient being monitored, Timeout performed, Emergency Drugs available and Suction available Patient Re-evaluated:Patient Re-evaluated prior to induction Oxygen Delivery Method: Circle system utilized Preoxygenation: Pre-oxygenation with 100% oxygen Induction Type: IV induction Ventilation: Mask ventilation without difficulty LMA: LMA inserted LMA Size: 4.0 Tube type: Oral Number of attempts: 1 Placement Confirmation: positive ETCO2,  breath sounds checked- equal and bilateral and CO2 detector Tube secured with: Tape Dental Injury: Teeth and Oropharynx as per pre-operative assessment

## 2020-06-20 NOTE — Evaluation (Signed)
Physical Therapy Evaluation Patient Details Name: Summer [name] MRN: [id number] DOB: [birth year] Today's Date: 06/20/2020   History of Present Illness  Pt is a 69 y/o female s/p L TKA. PMH includes fibromyalgia.   Clinical Impression  Pt is s/p surgery above with deficits below. Pt requiring min to min guard A for mobility using RW. Did note some L knee buckling during gait, likely from block. Educated about knee precautions. Will continue to follow acutely to maximize functional mobility independence and safety.     Follow Up Recommendations Follow surgeon's recommendation for DC plan and follow-up therapies;Supervision for mobility/OOB    Equipment Recommendations  Rolling walker with 5" wheels;3in1 (PT)    Recommendations for Other Services       Precautions / Restrictions Precautions Precautions: Knee Precaution Booklet Issued: No Precaution Comments: Verbally reviewed knee precautions.  Restrictions Weight Bearing Restrictions: Yes LLE Weight Bearing: Weight bearing as tolerated      Mobility  Bed Mobility Overal bed mobility: Needs Assistance Bed Mobility: Supine to Sit     Supine to sit: Supervision;HOB elevated     General bed mobility comments: Supervision for safety.   Transfers Overall transfer level: Needs assistance Equipment used: Rolling walker (2 wheeled) Transfers: Sit to/from Stand Sit to Stand: Min guard         General transfer comment: Min guard for safety. Cues for safe hand placement.   Ambulation/Gait Ambulation/Gait assistance: Min guard;Min assist Gait Distance (Feet): 5 Feet Assistive device: Rolling walker (2 wheeled) Gait Pattern/deviations: Step-to pattern;Decreased step length - right;Decreased step length - left;Decreased weight shift to left;Antalgic Gait velocity: Decreased   General Gait Details: Noted slight knee buckling, requiring up to min A for steadying. Cues for sequencing using RW.   Stairs             Wheelchair Mobility    Modified Rankin (Stroke Patients Only)       Balance Overall balance assessment: Needs assistance Sitting-balance support: No upper extremity supported;Feet supported Sitting balance-Leahy Scale: Fair     Standing balance support: Bilateral upper extremity supported;During functional activity Standing balance-Leahy Scale: Poor Standing balance comment: Reliant on BUE support                             Pertinent Vitals/Pain Pain Assessment: Faces Faces Pain Scale: Hurts a little bit Pain Location: L knee  Pain Descriptors / Indicators: Operative site guarding Pain Intervention(s): Limited activity within patient's tolerance;Monitored during session;Repositioned    Home Living Family/patient expects to be discharged to:: Private residence Living Arrangements: Spouse/significant other Available Help at Discharge: Family Type of Home: House Home Access: Stairs to enter Entrance Stairs-Rails: Right;Left;Can reach both Technical brewer of Steps: 4 Home Layout: One level Home Equipment: Cane - single point      Prior Function Level of Independence: Independent with assistive device(s)         Comments: occasionally used cane     Hand Dominance        Extremity/Trunk Assessment   Upper Extremity Assessment Upper Extremity Assessment: Overall WFL for tasks assessed    Lower Extremity Assessment Lower Extremity Assessment: LLE deficits/detail LLE Deficits / Details: Deficits consistent with post op pain and weakness.     Cervical / Trunk Assessment Cervical / Trunk Assessment: Normal  Communication   Communication: No difficulties  Cognition Arousal/Alertness: Awake/alert Behavior During Therapy: WFL for tasks assessed/performed Overall Cognitive Status: Within Functional Limits for tasks assessed  General Comments General comments (skin integrity, edema,  etc.): Pt's husband present.     Exercises Total Joint Exercises Ankle Circles/Pumps: AROM;Both;10 reps   Assessment/Plan    PT Assessment Patient needs continued PT services  PT Problem List Decreased strength;Decreased balance;Decreased mobility;Decreased knowledge of use of DME;Decreased knowledge of precautions;Pain       PT Treatment Interventions DME instruction;Gait training;Functional mobility training;Therapeutic activities;Stair training;Therapeutic exercise;Balance training;Patient/family education    PT Goals (Current goals can be found in the Care Plan section)  Acute Rehab PT Goals Patient Stated Goal: to be independent PT Goal Formulation: With patient Time For Goal Achievement: 07/04/20 Potential to Achieve Goals: Good    Frequency 7X/week   Barriers to discharge        Co-evaluation               AM-PAC PT "6 Clicks" Mobility  Outcome Measure Help needed turning from your back to your side while in a flat bed without using bedrails?: None Help needed moving from lying on your back to sitting on the side of a flat bed without using bedrails?: None Help needed moving to and from a bed to a chair (including a wheelchair)?: A Little Help needed standing up from a chair using your arms (e.g., wheelchair or bedside chair)?: A Little Help needed to walk in hospital room?: A Little Help needed climbing 3-5 steps with a railing? : A Lot 6 Click Score: 19    End of Session Equipment Utilized During Treatment: Gait belt Activity Tolerance: Patient tolerated treatment well Patient left: in chair;with call bell/phone within reach;with family/visitor present Nurse Communication: Mobility status PT Visit Diagnosis: Unsteadiness on feet (R26.81);Muscle weakness (generalized) (M62.81)    Time: 3735-7897 PT Time Calculation (min) (ACUTE ONLY): 24 min   Charges:   PT Evaluation $PT Eval Low Complexity: 1 Low PT Treatments $Therapeutic Activity: 8-22 mins         Lou Miner, DPT  Acute Rehabilitation Services  Pager: [phone] Office: [phone]   Summer [name] 06/20/2020, 4:56 PM

## 2020-06-20 NOTE — Progress Notes (Signed)
Orthopedic Tech Progress Note Patient Details:  Summer [name] [birth year] [id number] Patient was not able to reach 90 degrees but she did make it to 65.Marland Kitchen PACU RN asked she would increase CPM after a hour. CPM Left Knee CPM Left Knee: On Left Knee Flexion (Degrees): 65 Left Knee Extension (Degrees): 0 Additional Comments: patient tolerating well  Post Interventions Patient Tolerated: Well Instructions Provided: Care of device  [name] 06/20/2020, 11:25 AM

## 2020-06-20 NOTE — Anesthesia Procedure Notes (Signed)
Spinal  Patient location during procedure: OR Start time: 06/20/2020 7:30 AM End time: 06/20/2020 8:00 AM Staffing Performed: anesthesiologist  Anesthesiologist: Roberts Gaudy, MD Preanesthetic Checklist Completed: patient identified, IV checked, site marked, risks and benefits discussed, surgical consent, monitors and equipment checked, pre-op evaluation and timeout performed Spinal Block Patient position: sitting Prep: DuraPrep Patient monitoring: heart rate, cardiac monitor, continuous pulse ox and blood pressure Approach: midline Location: L4-5 Injection technique: single-shot Needle Needle type: Sprotte and Tuohy  Needle gauge: 22 G Needle length: 9 cm Assessment Sensory level: T6 Additional Notes 1.6 cc 0.75% Bupivacaine injected

## 2020-06-20 NOTE — Op Note (Signed)
Total Knee Arthroplasty Procedure Note  Preoperative diagnosis: Left knee osteoarthritis  Postoperative diagnosis:same  Operative procedure: Left total knee arthroplasty. CPT [phone]  Surgeon: N. Eduard Roux, MD  Assist: Madalyn Rob, PA-C; necessary for the timely completion of procedure and due to complexity of procedure.  Anesthesia: Spinal, regional, local  Tourniquet time: see anesthesia record  Implants used: Zimmer persona Femur: CR 8 Tibia: E Patella: 32 mm Polyethylene: 10 mm, MC  Indication: Summer [name] is a 69 y.o. year old female with a history of knee pain. Having failed conservative management, the patient elected to proceed with a total knee arthroplasty.  We have reviewed the risk and benefits of the surgery and they elected to proceed after voicing understanding.  Procedure:  After informed consent was obtained and understanding of the risk were voiced including but not limited to bleeding, infection, damage to surrounding structures including nerves and vessels, blood clots, leg length inequality and the failure to achieve desired results, the operative extremity was marked with verbal confirmation of the patient in the holding area.   The patient was then brought to the operating room and transported to the operating room table in the supine position.  A tourniquet was applied to the operative extremity around the upper thigh. The operative limb was then prepped and draped in the usual sterile fashion and preoperative antibiotics were administered.  A time out was performed prior to the start of surgery confirming the correct extremity, preoperative antibiotic administration, as well as team members, implants and instruments available for the case. Correct surgical site was also confirmed with preoperative radiographs. The limb was then elevated for exsanguination and the tourniquet was inflated. A midline incision was made and a standard medial  parapatellar approach was performed.  The infrapatellar fat pad was removed.  Suprapatellar synovium was removed to reveal the anterior distal femoral cortex.  A medial peel was performed to release the capsule of the medial tibial plateau.  The patella was then everted and was prepared and sized to a 32 mm.  A cover was placed on the patella for protection from retractors.  The knee was then brought into full flexion and we then turned our attention to the femur.  The cruciates were sacrificed.  Start site was drilled in the femur and the intramedullary distal femoral cutting guide was placed, set at 5 degrees valgus, taking 12 mm of distal resection. The distal cut was made. Osteophytes were then removed.  Next, the proximal tibial cutting guide was placed with appropriate slope, varus/valgus alignment and depth of resection. The proximal tibial cut was made. Gap blocks were then used to assess the extension gap and alignment, and appropriate soft tissue releases were performed. Attention was turned back to the femur, which was sized using the sizing guide to a size 8. Appropriate rotation of the femoral component was determined using epicondylar axis, Whiteside's line, and assessing the flexion gap under ligament tension. The appropriate size 4-in-1 cutting block was placed and checked with an angel wing and cuts were made. Posterior femoral osteophytes and uncapped bone were then removed with the curved osteotome.  Trial components were placed, and stability was checked in full extension, mid-flexion, and deep flexion. Proper tibial rotation was determined and marked.  The patella tracked well without a lateral release.  The femoral lugs were then drilled. Trial components were then removed and tibial preparation performed.  The tibia was sized for a size E component.  A posterior capsular  injection comprising of 20 cc of 1.3% exparel, 20 cc of 0.25% bupivicaine with epi and 20 cc of normal saline was  performed for postoperative pain control. The bony surfaces were irrigated with a pulse lavage and then dried. Bone cement was vacuum mixed on the back table, and the final components sized above were cemented into place.  Antibiotic irrigation was placed in the knee joint and soft tissues while the cement cured.  After cement had finished curing, excess cement was removed. The stability of the construct was re-evaluated throughout a range of motion and found to be acceptable. The trial liner was removed, the knee was copiously irrigated, and the knee was re-evaluated for any excess bone debris. The real polyethylene liner, 10 mm thick, was inserted and checked to ensure the locking mechanism had engaged appropriately. The tourniquet was deflated and hemostasis was achieved. The wound was irrigated with normal saline.  One gram of vancomycin powder was placed in the surgical bed.  Capsular closure was performed with a #1 vicryl, subcutaneous fat closed with a 0 vicryl suture, then subcutaneous tissue closed with interrupted 2.0 vicryl suture. The skin was then closed with a 3.0 monocryl and steri strips. A sterile dressing was applied.  The patient was awakened in the operating room and taken to recovery in stable condition. All sponge, needle, and instrument counts were correct at the end of the case.  Position: supine  Complications: none.  Time Out: performed   Drains/Packing: none  Estimated blood loss: minimal  Returned to Recovery Room: in good condition.   Antibiotics: yes   Mechanical VTE (DVT) Prophylaxis: sequential compression devices, TED thigh-high  Chemical VTE (DVT) Prophylaxis: xarelto  Fluid Replacement  Crystalloid: see anesthesia record Blood: none  FFP: none   Specimens Removed: 1 to pathology   Sponge and Instrument Count Correct? yes   PACU: portable radiograph - knee AP and Lateral   Plan/RTC: Return in 2 weeks for wound check.   Weight Bearing/Load Lower  Extremity: full   N. Eduard Roux, MD [facility] 9:27 AM

## 2020-06-20 NOTE — H&P (Signed)
PREOPERATIVE H&P  Chief Complaint: left knee degenerative joint disease  HPI: Summer [name] is a 69 y.o. female who presents for surgical treatment of left knee degenerative joint disease.  She denies any changes in medical history.  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   Complication of anesthesia    Pt states having some "difficulty waking up"   Constipation 07/01/2018   Costochondritis 05/04/2014   Diverticulitis of colon 06/13/2007   S/p segmental colectomy for perforated diverticulitis    Diverticulosis    Facial pain 10/06/2015   Fibromyalgia    Foot sprain, left, initial encounter 08/31/2016   Glossitis 02/02/2016   Headache 04/24/2016   HEMATURIA UNSPECIFIED 06/25/2008   Qualifier: Diagnosis of  By: Sherren Mocha MD, Jory Ee    HEMATURIA, HX OF 11/14/2009   Qualifier: Diagnosis of  By: Regino Schultze CMA (AAMA), Rachel     History of kidney stones    History of kidney stones    History of shingles 2012--  no residual pain   Itching 10/13/2018   Left eye pain 07/01/2018   Left ureteral calculus    LLQ pain 04/24/2016   LOC OSTEOARTHROS NOT SPEC PRIM/SEC OTH [facility] SITE 06/27/2009   Qualifier: Diagnosis of  By: Sherren Mocha MD, Dellis Filbert A    Malignant neoplasm of upper-outer quadrant of right breast in female, estrogen receptor positive ([place]) 11/17/2018   MCI (mild cognitive impairment)    Memory loss 12/21/2016   Morbid obesity ([place]) 11/09/2010   Pain of right side of body 08/31/2016   Personal history of chemotherapy    Personal history of  radiation therapy    Pruritus 10/26/2016   Renal disorder    Right sided temporal headache 12/21/2016   Tremor 12/21/2016   Trigeminal neuralgia RIGHT   Trigeminal neuralgia of right side of face 11/30/2010   Trochanteric bursitis, right hip 10/26/2016   Urgency of urination    Weight loss 07/22/2017   Zoster without complications 1/76/1607   Past Surgical History:  Procedure Laterality Date   BREAST BIOPSY Right 11/11/2018   BREAST LUMPECTOMY Right 11/28/2018   BREAST LUMPECTOMY WITH RADIOACTIVE SEED AND SENTINEL LYMPH NODE BIOPSY Right 11/28/2018   Procedure: RIGHT BREAST LUMPECTOMY WITH RADIOACTIVE SEED AND RIGHT AXILLARY SENTINEL LYMPH NODE BIOPSY;  Surgeon: Excell Seltzer, MD;  Location: [place];  Service: General;  Laterality: Right;   CEREBRAL MICROVASCULAR DECOMPRESSION  07-05-2006   RIGHT TRIGEMINAL NERVE   CYSTOSCOPY/RETROGRADE/URETEROSCOPY/STONE EXTRACTION WITH BASKET  07/04/2012   Procedure: CYSTOSCOPY/RETROGRADE/URETEROSCOPY/STONE EXTRACTION WITH BASKET;  Surgeon: Claybon Jabs, MD;  Location: [place];  Service: Urology;  Laterality: Left;   EXPLORATORY LAPAROTOMY/ RESECTION MID TO DISTAL SIGMOID AND PROXIMAL RECTUM/ END PROXIMAL SIGMOID COLOSTOMY  07-17-2006   PERFORATED DIVERTICULITIS WITH PERITONITIS   gamma knife  05/17/2016   for trigeminal neuralgia, [facility], Dr Salomon Fick   gamma knife  2019   IR CV LINE INJECTION  10/27/2019   PORTACATH PLACEMENT Left 11/28/2018   Procedure: INSERTION PORT-A-CATH WITH ULTRASOUND;  Surgeon: Excell Seltzer, MD;  [facility];  Service: General;  Laterality: Left;   portacath removal     RESECTION COLOSTOMY/ CLOSURE COLOSTOMY WITH COLOPROCTOSTOMY  02-20-2007   TONSILLECTOMY  AS CHILD   URETEROSCOPY  07/04/2012   Procedure: URETEROSCOPY;  Surgeon: Claybon Jabs, MD;  Location[facility];  Service: Urology;  Laterality: Left;   VAGINAL HYSTERECTOMY  1998   Partial    WISDOM TOOTH EXTRACTION     Social History   Socioeconomic History   Marital status: Married    Spouse name: Not on file   Number of children: 1   Years of education: College   Highest education level: Not on file  Occupational History    Employer: UNEMPLOYED   Occupation: HUMAN RESOURCES     Employer: [employer]  Tobacco Use   Smoking status: Never Smoker   Smokeless tobacco: Never Used  Scientific laboratory technician Use: Never used  Substance and Sexual Activity   Alcohol use: Yes    Comment: rarely   Drug use: No   Sexual activity: Not on file  Other Topics Concern   Not on file  Social History Narrative   Not on file   Social Determinants of Health   Financial Resource Strain:    Difficulty of Paying Living Expenses: Not on file  Food Insecurity:    Worried About Charity fundraiser in the Last Year: Not on file   [employer] of Food in the Last Year: Not on file  Transportation Needs:    Lack of Transportation (Medical): Not on file   Lack of Transportation (Non-Medical): Not on file  Physical Activity:    Days of Exercise per Week: Not on file   Minutes of Exercise per Session: Not on file  Stress:    Feeling of Stress : Not on file  Social Connections:    Frequency of Communication with Friends and Family: Not on file   Frequency of Social Gatherings with Friends and Family: Not on file   Attends Religious Services: Not on file   Active Member of Clubs or Organizations: Not on file   Attends Archivist Meetings: Not on file   Marital Status: Not on file   Family History  Problem Relation Age of Onset   Heart disease Maternal Grandmother    Diabetes Mother    Breast cancer Maternal Aunt    Lupus Cousin    Colon cancer Neg Hx    Esophageal cancer Neg Hx    Rectal cancer Neg Hx    Stomach cancer Neg Hx    Allergies  Allergen Reactions   Clindamycin Hcl Itching   Prior to Admission medications   Medication Sig  Start Date End Date Taking? Authorizing Provider  acetaminophen (TYLENOL) 500 MG tablet Take 1,500 mg by mouth 2 (two) times daily as needed for moderate pain or headache.    Yes [provider]  augmented betamethasone dipropionate (DIPROLENE-AF) 0.05 % cream Apply 1 application topically daily as needed for itching. 05/27/20  Yes [provider]  diphenhydrAMINE (BENADRYL) 25 MG tablet Take 25 mg by mouth every 6 (six) hours as needed for itching, allergies or sleep.   Yes [provider]  exemestane (AROMASIN) 25 MG tablet Take 25 mg by mouth daily. 05/31/20  Yes [provider]  gabapentin (NEURONTIN) 600 MG tablet TAKE 2 TABLETS TID. Patient taking differently: Take 1,200 mg by mouth 3 (three) times daily.  05/20/19  Yes Marcial Pacas, MD  Vitamin D, Ergocalciferol, (DRISDOL) 1.25 MG (50000 UNIT) CAPS capsule Take 1 capsule (50,000 Units total) by mouth every 7 (seven) days. Patient taking differently: Take 50,000 Units by mouth every Saturday.  02/29/20  Yes Hoyt Koch, MD  hydrOXYzine (ATARAX/VISTARIL) 25 MG tablet Take 0.5 tablets (12.5 mg total) by mouth every 8 (eight) hours as needed for itching. Patient not taking: Reported on 06/08/2020 05/21/20   Jaynee Eagles, PA-C  DULoxetine (CYMBALTA) 60 MG capsule TAKE 1 CAPSULE BY MOUTH EVERY DAY Patient taking differently: Take 60 mg by mouth daily.  08/27/19 05/21/20  Marcial Pacas, MD  tiZANidine (ZANAFLEX) 2 MG tablet Take 1-2 tablets (2-4 mg total) by mouth every 6 (six) hours as needed for muscle spasms. 12/15/19   Hilts, Legrand Como, MD     Positive ROS: All other systems have been reviewed and were otherwise negative with the exception of those mentioned in the HPI and as above.  Physical Exam: General: Alert, no acute distress Cardiovascular: No pedal edema Respiratory: No cyanosis, no use of accessory musculature GI: abdomen soft Skin: No lesions in the area of chief complaint Neurologic: Sensation  intact distally Psychiatric: Patient is competent for consent with normal mood and affect Lymphatic: no lymphedema  MUSCULOSKELETAL: exam stable  Assessment: left knee degenerative joint disease  Plan: Plan for Procedure(s): LEFT TOTAL KNEE ARTHROPLASTY  The risks benefits and alternatives were discussed with the patient including but not limited to the risks of nonoperative treatment, versus surgical intervention including infection, bleeding, nerve injury,  blood clots, cardiopulmonary complications, morbidity, mortality, among others, and they were willing to proceed.   Preoperative templating of the joint replacement has been completed, documented, and submitted to the Operating Room personnel in order to optimize intra-operative equipment management.   Eduard Roux, MD 06/20/2020 7:04 AM

## 2020-06-20 NOTE — Plan of Care (Signed)

## 2020-06-20 NOTE — Transfer of Care (Signed)
Immediate Anesthesia Transfer of Care Note  Patient: Deena Shaub  Procedure(s) Performed: LEFT TOTAL KNEE ARTHROPLASTY (Left Knee)  Patient Location: PACU  Anesthesia Type:General  Level of Consciousness: oriented, drowsy, patient cooperative and responds to stimulation  Airway & Oxygen Therapy: Patient Spontanous Breathing and Patient connected to nasal cannula oxygen  Post-op Assessment: Report given to RN, Post -op Vital signs reviewed and stable and Patient moving all extremities X 4  Post vital signs: Reviewed and stable  Last Vitals:  Vitals Value Taken Time  BP    Temp    Pulse 102 06/20/20 1004  Resp 29 06/20/20 1004  SpO2 100 % 06/20/20 1004  Vitals shown include unvalidated device data.  Last Pain:  Vitals:   06/20/20 0626  TempSrc:   PainSc: 0-No pain         Complications: No complications documented.

## 2020-06-21 ENCOUNTER — Encounter (HOSPITAL_COMMUNITY): Payer: Self-pay | Admitting: Orthopaedic Surgery

## 2020-06-21 DIAGNOSIS — M1712 Unilateral primary osteoarthritis, left knee: Secondary | ICD-10-CM | POA: Diagnosis not present

## 2020-06-21 LAB — CBC
HCT: 32.4 % — ABNORMAL LOW (ref 36.0–46.0)
Hemoglobin: 10.3 g/dL — ABNORMAL LOW (ref 12.0–15.0)
MCH: 28.8 pg (ref 26.0–34.0)
MCHC: 31.8 g/dL (ref 30.0–36.0)
MCV: 90.5 fL (ref 80.0–100.0)
Platelets: 204 10*3/uL (ref 150–400)
RBC: 3.58 MIL/uL — ABNORMAL LOW (ref 3.87–5.11)
RDW: 12.5 % (ref 11.5–15.5)
WBC: 7.2 10*3/uL (ref 4.0–10.5)
nRBC: 0 % (ref 0.0–0.2)

## 2020-06-21 LAB — BASIC METABOLIC PANEL
Anion gap: 7 (ref 5–15)
BUN: 12 mg/dL (ref 8–23)
CO2: 25 mmol/L (ref 22–32)
Calcium: 8.3 mg/dL — ABNORMAL LOW (ref 8.9–10.3)
Chloride: 105 mmol/L (ref 98–111)
Creatinine, Ser: 0.72 mg/dL (ref 0.44–1.00)
GFR, Estimated: >60 mL/min (ref >60)
Glucose, Bld: 108 mg/dL — ABNORMAL HIGH (ref 70–99)
Potassium: 4.1 mmol/L (ref 3.5–5.1)
Sodium: 137 mmol/L (ref 135–145)

## 2020-06-21 NOTE — Progress Notes (Signed)
Physical Therapy Treatment Patient Details Name: Summer [name] MRN: [id number] DOB: March 24, 1951 Today's Date: 06/21/2020    History of Present Illness Pt is a 69 y/o female s/p L TKA. PMH includes fibromyalgia.     PT Comments    Patient is progressing towards physical therapy goals. Patient is highly motivated and accepting of information and exercises. Session focused on LE strengthening and functional mobility for safe return home. Patient ambulated 150' with RW and minguard-supervision. Negotiated 6 stairs with B handrails and minguard-supervision. Cues required for sequencing with good follow through from patient. Patient continues to be limited by L LE strength deficits, impaired L knee ROM, decreased activity tolerance, impaired functional mobility. Patient will benefit from further PT services following discharge to address functional mobility deficits. PT will continue to follow.     Follow Up Recommendations  Follow surgeons recommendation for DC plan and follow-up therapies;Supervision for mobility/OOB     Equipment Recommendations  Rolling Jimmye Wisnieski with 5" wheels;3in1 (PT)    Recommendations for Other Services       Precautions / Restrictions Precautions Precautions: Knee Precaution Booklet Issued: No Precaution Comments: Verbally reviewed knee precautions.  Restrictions Weight Bearing Restrictions: Yes LLE Weight Bearing: Weight bearing as tolerated    Mobility  Bed Mobility Overal bed mobility: Needs Assistance Bed Mobility: Supine to Sit;Sit to Supine     Supine to sit: Supervision Sit to supine: Supervision   General bed mobility comments: Supervision for safety.   Transfers Overall transfer level: Needs assistance Equipment used: Rolling Khayree Delellis (2 wheeled) Transfers: Sit to/from Stand Sit to Stand: Supervision         General transfer comment: Patient able to rise with use of B UEs on EOB and supervision for  safety  Ambulation/Gait Ambulation/Gait assistance: Min guard;Supervision Gait Distance (Feet): 150 Feet Assistive device: Rolling Secily Walthour (2 wheeled) Gait Pattern/deviations: Step-through pattern;Decreased weight shift to left Gait velocity: Decreased Gait velocity interpretation: <1.8 ft/sec, indicate of risk for recurrent falls General Gait Details: Cues for heel strike to emphasize knee extension with ambulation. Good follow through.   Stairs Stairs: Yes Stairs assistance: Min guard;Supervision Stair Management: Two rails;Step to pattern;Forwards Number of Stairs: 6 General stair comments: Instructed patient on proper stair negotiation (up with nonsurgical limb and down with surgical limb), good follow through   Wheelchair Mobility    Modified Rankin (Stroke Patients Only)       Balance Overall balance assessment: Needs assistance Sitting-balance support: No upper extremity supported;Feet supported Sitting balance-Leahy Scale: Fair     Standing balance support: During functional activity Standing balance-Leahy Scale: Fair Standing balance comment: Patient able to stand and stabilize following sit to stand with supervision and no UE support                            Cognition Arousal/Alertness: Awake/alert Behavior During Therapy: WFL for tasks assessed/performed Overall Cognitive Status: Within Functional Limits for tasks assessed                                        Exercises Total Joint Exercises Quad Sets: AROM;Strengthening;Left;10 reps;Supine Hip ABduction/ADduction: AROM;Strengthening;Both;10 reps;Supine Straight Leg Raises: AROM;Strengthening;Left;5 reps;Supine    General Comments General comments (skin integrity, edema, etc.): Pt is highly motivated to continue with further therapy following discharge. She is very accepting of information and exercises.  Pertinent Vitals/Pain Pain Assessment: No/denies pain     Home Living                      Prior Function            PT Goals (current goals can now be found in the care plan section) Acute Rehab PT Goals Patient Stated Goal: to be independent and go home PT Goal Formulation: With patient Time For Goal Achievement: 07/04/20 Potential to Achieve Goals: Good Progress towards PT goals: Progressing toward goals    Frequency    7X/week      PT Plan Current plan remains appropriate    Co-evaluation              AM-PAC PT "6 Clicks" Mobility   Outcome Measure  Help needed turning from your back to your side while in a flat bed without using bedrails?: None Help needed moving from lying on your back to sitting on the side of a flat bed without using bedrails?: None Help needed moving to and from a bed to a chair (including a wheelchair)?: None Help needed standing up from a chair using your arms (e.g., wheelchair or bedside chair)?: None Help needed to walk in hospital room?: None Help needed climbing 3-5 steps with a railing? : A Little 6 Click Score: 23    End of Session Equipment Utilized During Treatment: Gait belt Activity Tolerance: Patient tolerated treatment well Patient left: in bed;with call bell/phone within reach Nurse Communication: Mobility status PT Visit Diagnosis: Unsteadiness on feet (R26.81);Muscle weakness (generalized) (M62.81)     Time: 8325-4982 PT Time Calculation (min) (ACUTE ONLY): 34 min  Charges:  $Gait Training: 8-22 mins $Therapeutic Exercise: 8-22 mins                     Perrin Maltese, PT, DPT Acute Rehabilitation Services Pager [phone] Office [phone]    Summer [name] 06/21/2020, 9:29 AM

## 2020-06-21 NOTE — TOC Transition Note (Addendum)
Transition of Care [facility]) - CM/SW Discharge Note   Patient Details  Name: Summer [name] MRN: [id number] Date of Birth: Jun 30, 1951  [facility] CM/SW Contact:  Sharin Mons, RN Phone Number: 06/21/2020, 1:24 PM   Clinical Narrative:    Patient will DC to: home Anticipated DC date: 06/21/2020 Family notified: yes Transport by: car            - s/p L TKA, 10/11. PMH includes fibromyalgia  Per MD patient ready for DC today. RN, patient, and patient's family notified of DC. Pt's home health already arranged with [facility] pre surgery.DME: rolling walker and 3 in  1/BSC wil be provided to pt prior to d/c. Pt without Rx med concerns or affordability. Post hospital f/u noted on AVS. RNCM will sign off for now as intervention is no longer needed. Please consult [place] again if new needs arise.    Final next level of care: [place] Barriers to Discharge: No Barriers Identified   Patient Goals and CMS Choice     Choice offered to / list presented to : Patient  Discharge Placement                       Discharge Plan and Services                DME Arranged: 3-N-1, Walker rolling DME Agency: [employer] Date DME Agency Contacted: 06/21/20 Time DME Agency Contacted: [place]: PT HH Agency: [facility] [employer] Date [place]: 06/21/20 Time [place]: 1011 Representative spoke with at [place]: [place] ([place]) Interventions     Readmission Risk Interventions No flowsheet data found.

## 2020-06-21 NOTE — Discharge Summary (Signed)
Patient ID: Summer [name] MRN: [id number] DOB/AGE: 03-06-1951 69 y.o.  Admit date: 06/20/2020 Discharge date: 06/21/2020  Admission Diagnoses:  Principal Problem:   Primary osteoarthritis of left knee Active Problems:   Status post total left knee replacement   Discharge Diagnoses:  Same  Past Medical History:  Diagnosis Date  . Abdominal pain, left lower quadrant 11/09/2009   Qualifier: Diagnosis of  By: Valma Cava LPN, Izora Gala    . Allergic rhinitis 08/26/2008   Qualifier: Diagnosis of  By: Sherren Mocha MD, Jory Ee   . Anemia, iron deficiency 11/21/2018  . Arthritis shoulders and back  . BACK PAIN, CHRONIC 06/13/2007   Qualifier: Diagnosis of  By: Sherren Mocha MD, Jory Ee   . 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 Complication of anesthesia    Pt states having some "difficulty waking up"  . Constipation 07/01/2018  . Costochondritis 05/04/2014  . Diverticulitis of colon 06/13/2007   S/p segmental colectomy for perforated diverticulitis   . Diverticulosis   . Facial pain 10/06/2015  . Fibromyalgia   . Foot sprain, left, initial encounter 08/31/2016  . Glossitis 02/02/2016  . Headache 04/24/2016  . HEMATURIA UNSPECIFIED 06/25/2008   Qualifier: Diagnosis of  By: Sherren Mocha MD, Jory Ee   . HEMATURIA, HX OF 11/14/2009   Qualifier: Diagnosis of  By: Regino Schultze CMA (AAMA), Apolonio Schneiders    . History of kidney stones   . History of kidney stones   . History of shingles 2012--  no residual pain  . Itching 10/13/2018  . Left eye pain 07/01/2018  . Left ureteral calculus   . LLQ pain 04/24/2016  . LOC OSTEOARTHROS NOT SPEC PRIM/SEC OTH [facility] SITE 06/27/2009   Qualifier: Diagnosis of  By: Sherren Mocha MD, Jory Ee   . Malignant neoplasm of upper-outer quadrant of right breast in female, estrogen receptor positive ([place]) 11/17/2018  . MCI (mild cognitive impairment)   . Memory loss 12/21/2016  . Morbid obesity ([place]) 11/09/2010  . Pain of right side of body 08/31/2016  .  Personal history of chemotherapy   . Personal history of radiation therapy   . Pruritus 10/26/2016  . Renal disorder   . Right sided temporal headache 12/21/2016  . Tremor 12/21/2016  . Trigeminal neuralgia RIGHT  . Trigeminal neuralgia of right side of face 11/30/2010  . Trochanteric bursitis, right hip 10/26/2016  . Urgency of urination   . Weight loss 07/22/2017  . Zoster without complications 5/39/7673    Surgeries: Procedure(s): LEFT TOTAL KNEE ARTHROPLASTY on 06/20/2020   Consultants:   Discharged Condition: Improved  Hospital Course: Sibley Rolison is an 69 y.o. female who was admitted 06/20/2020 for operative treatment ofPrimary osteoarthritis of left knee. Patient has severe unremitting pain that affects sleep, daily activities, and work/hobbies. After pre-op clearance the patient was taken to the operating room on 06/20/2020 and underwent  Procedure(s): LEFT TOTAL KNEE ARTHROPLASTY.    Patient was given perioperative antibiotics:  Anti-infectives (From admission, onward)   Start     Dose/Rate Route Frequency Ordered Stop   06/20/20 1600  ceFAZolin (ANCEF) IVPB 2g/100 mL premix        2 g 200 mL/hr over 30 Minutes Intravenous Every 6 hours 06/20/20 1039 06/21/20 0600   06/20/20 0833  vancomycin (VANCOCIN) powder  Status:  Discontinued          As needed 06/20/20 0833 06/20/20 0957   06/20/20 0630  ceFAZolin (ANCEF) IVPB 2g/100 mL premix  2 g 200 mL/hr over 30 Minutes Intravenous On call to O.R. 06/20/20 4782 06/20/20 0727   06/20/20 0621  ceFAZolin (ANCEF) 2-4 GM/100ML-% IVPB       Note to Pharmacy: Alvy Beal   : cabinet override      06/20/20 0621 06/20/20 0727       Patient was given sequential compression devices, early ambulation, and chemoprophylaxis to prevent DVT.  Patient benefited maximally from hospital stay and there were no complications.    Recent vital signs:  Patient Vitals for the past 24 hrs:  BP Temp Temp src Pulse Resp SpO2  Height Weight  06/21/20 0743 (!) 120/21 98.8 F (37.1 C) Oral 77 17 -- 5\' 6"  (1.676 m) 87.1 kg  06/21/20 0727 (!) 120/51 98.8 F (37.1 C) Oral 77 17 99 % -- --  06/21/20 0329 (!) 111/57 98.8 F (37.1 C) Oral 74 14 96 % -- --  06/20/20 1943 (!) 115/53 98.9 F (37.2 C) Oral 89 14 98 % -- --  06/20/20 1458 124/74 97.9 F (36.6 C) Oral 83 17 100 % -- --  06/20/20 1428 -- (!) 97.2 F (36.2 C) -- 91 12 100 % -- --  06/20/20 1406 131/65 -- -- 100 16 100 % -- --  06/20/20 1336 122/65 -- -- 96 12 98 % -- --  06/20/20 1306 125/65 -- -- 93 12 99 % -- --  06/20/20 1236 126/69 -- -- 90 13 99 % -- --  06/20/20 1206 (!) 150/80 98.6 F (37 C) -- 89 12 92 % -- --  06/20/20 1136 (!) 137/53 -- -- 87 12 100 % -- --  06/20/20 1106 (!) 154/70 -- -- 85 18 100 % -- --  06/20/20 1052 (!) 149/61 -- -- 88 20 100 % -- --  06/20/20 1037 (!) 165/80 -- -- 95 20 100 % -- --  06/20/20 1021 (!) 144/85 -- -- (!) 102 20 98 % -- --  06/20/20 1006 (!) 122/106 (!) 96.8 F (36 C) -- -- 18 100 % -- --     Recent laboratory studies:  Recent Labs    06/21/20 0442  WBC 7.2  HGB 10.3*  HCT 32.4*  PLT 204  NA 137  K 4.1  CL 105  CO2 25  BUN 12  CREATININE 0.72  GLUCOSE 108*  CALCIUM 8.3*     Discharge Medications:   Allergies as of 06/21/2020      Reactions   Clindamycin Hcl Itching      Medication List    STOP taking these medications   acetaminophen 500 MG tablet Commonly known as: TYLENOL     TAKE these medications   augmented betamethasone dipropionate 0.05 % cream Commonly known as: DIPROLENE-AF Apply 1 application topically daily as needed for itching.   diphenhydrAMINE 25 MG tablet Commonly known as: BENADRYL Take 25 mg by mouth every 6 (six) hours as needed for itching, allergies or sleep.   docusate sodium 100 MG capsule Commonly known as: Colace Take 1 capsule (100 mg total) by mouth daily as needed.   exemestane 25 MG tablet Commonly known as: AROMASIN Take 25 mg by mouth  daily.   gabapentin 600 MG tablet Commonly known as: NEURONTIN TAKE 2 TABLETS TID. What changed:   how much to take  how to take this  when to take this  additional instructions   hydrOXYzine 25 MG tablet Commonly known as: ATARAX/VISTARIL Take 0.5 tablets (12.5 mg total) by mouth every 8 (eight) hours  as needed for itching.   methocarbamol 500 MG tablet Commonly known as: Robaxin Take 1 tablet (500 mg total) by mouth 2 (two) times daily as needed for muscle spasms.   ondansetron 4 MG tablet Commonly known as: Zofran Take 1 tablet (4 mg total) by mouth every 8 (eight) hours as needed for nausea or vomiting.   oxyCODONE-acetaminophen 5-325 MG tablet Commonly known as: Percocet Take 1-2 tablets by mouth every 6 (six) hours as needed for severe pain.   rivaroxaban 10 MG Tabs tablet Commonly known as: XARELTO Take 1 tablet (10 mg total) by mouth daily.   Vitamin D (Ergocalciferol) 1.25 MG (50000 UNIT) Caps capsule Commonly known as: DRISDOL Take 1 capsule (50,000 Units total) by mouth every 7 (seven) days. What changed: when to take this            Durable Medical Equipment  (From admission, onward)         Start     Ordered   06/20/20 1527  DME Walker rolling  Once       Question:  Patient needs a walker to treat with the following condition  Answer:  Total knee replacement status   06/20/20 1526   06/20/20 1527  DME 3 n 1  Once        06/20/20 1526   06/20/20 1527  DME Bedside commode  Once       Question:  Patient needs a bedside commode to treat with the following condition  Answer:  Total knee replacement status   06/20/20 1526          Diagnostic Studies: DG Chest 2 View  Result Date: 06/13/2020 CLINICAL DATA:  Preoperative exam for right knee surgery. Knee osteoarthritis. EXAM: CHEST - 2 VIEW COMPARISON:  11/02/2019 FINDINGS: Interval removal of left-sided Port-A-Cath. Stable cardiomediastinal contours. No focal airspace consolidation, pleural  effusion, or pneumothorax. Osseous structures intact. Degenerative changes noted to the left shoulder. IMPRESSION: No active cardiopulmonary disease. Electronically Signed   By: Davina Poke D.O.   On: 06/13/2020 09:36   DG Knee Left Port  Result Date: 06/20/2020 CLINICAL DATA:  Status post left total knee replacement. EXAM: PORTABLE LEFT KNEE - 1-2 VIEW COMPARISON:  February 16, 2020. FINDINGS: The left femoral and tibial components are well situated. Expected postoperative changes are noted in the soft tissues anteriorly. IMPRESSION: Status post left total knee arthroplasty. Electronically Signed   By: Marijo Conception M.D.   On: 06/20/2020 11:03    Disposition: Discharge disposition: 01-Home or Self Care          Follow-up Information    Leandrew Koyanagi, MD. Schedule an appointment as soon as possible for a visit in 2 weeks.   Specialty: Orthopedic Surgery Contact information: [address] [place] [address] [phone]                Signed: Aundra Dubin 06/21/2020, 7:59 AM

## 2020-06-21 NOTE — Progress Notes (Signed)
Subjective: 1 Day Post-Op Procedure(s) (LRB): LEFT TOTAL KNEE ARTHROPLASTY (Left) Patient reports pain as mild.    Objective: Vital signs in last 24 hours: Temp:  [96.8 F (36 C)-98.9 F (37.2 C)] 98.8 F (37.1 C) (10/12 0743) Pulse Rate:  [74-102] 77 (10/12 0743) Resp:  [12-20] 17 (10/12 0743) BP: (111-165)/(21-106) 120/21 (10/12 0743) SpO2:  [92 %-100 %] 99 % (10/12 0727) Weight:  [87.1 kg] 87.1 kg (10/12 0743)  Intake/Output from previous day: 10/11 0701 - 10/12 0700 In: 2716.1 [P.O.:120; I.V.:2246.1; IV Piggyback:300] Out: 1500 [Urine:1400; Blood:100] Intake/Output this shift: No intake/output data recorded.  Recent Labs    06/21/20 0442  HGB 10.3*   Recent Labs    06/21/20 0442  WBC 7.2  RBC 3.58*  HCT 32.4*  PLT 204   Recent Labs    06/21/20 0442  NA 137  K 4.1  CL 105  CO2 25  BUN 12  CREATININE 0.72  GLUCOSE 108*  CALCIUM 8.3*   No results for input(s): LABPT, INR in the last 72 hours.  Neurologically intact Neurovascular intact Sensation intact distally Intact pulses distally Dorsiflexion/Plantar flexion intact Incision: dressing C/D/I No cellulitis present Compartment soft   Assessment/Plan: 1 Day Post-Op Procedure(s) (LRB): LEFT TOTAL KNEE ARTHROPLASTY (Left) Advance diet Up with therapy D/C IV fluids Discharge home with home health after first or second PT session (depending on mobilization) WBAT LLE ABLA- mild and stable   Anticipated LOS equal to or greater than 2 midnights due to - Age 67 and older with one or more of the following:  - Obesity  - Expected need for hospital services (PT, OT, Nursing) required for safe  discharge  - Anticipated need for postoperative skilled nursing care or inpatient rehab  - Active co-morbidities: None OR   - Unanticipated findings during/Post Surgery: None  - Patient is a high risk of re-admission due to: None    Aundra Dubin 06/21/2020, 7:57 AM

## 2020-06-21 NOTE — Progress Notes (Signed)
AVS reviewed, IV removed, belongings gathered, pt dressed, and DME delivered. All questions answered to satisfaction. Pt called ride to hospital, will continue to monitor until then.

## 2020-06-21 NOTE — Plan of Care (Signed)

## 2020-06-21 NOTE — Progress Notes (Signed)
Physical Therapy Treatment Patient Details Name: Summer [name] MRN: [id number] DOB: [birth year] Today's Date: 06/21/2020    History of Present Illness Pt is a 69 y/o female s/p L TKA. PMH includes fibromyalgia.     PT Comments    Patient is steadily progressing towards physical therapy goals. Session focused on increasing ambulation distance and LE strengthening. Patient ambulated 200' with RW and supervision. Patient performed bed mobility modI. Patient is highly motivated during treatment session. Recommend further physical therapy services following discharge to maximize functional mobility. PT will continue to follow.     Follow Up Recommendations  Follow surgeon's recommendation for DC plan and follow-up therapies     Equipment Recommendations  Rolling Amelia Burgard with 5" wheels    Recommendations for Other Services       Precautions / Restrictions Precautions Precautions: Knee Precaution Booklet Issued: No Precaution Comments: Verbally reviewed knee precautions.  Restrictions Weight Bearing Restrictions: Yes LLE Weight Bearing: Weight bearing as tolerated    Mobility  Bed Mobility Overal bed mobility: Needs Assistance Bed Mobility: Supine to Sit;Sit to Supine     Supine to sit: Modified independent (Device/Increase time) Sit to supine: Modified independent (Device/Increase time)      Transfers Overall transfer level: Needs assistance Equipment used: Rolling Mekenna Finau (2 wheeled) Transfers: Sit to/from Stand Sit to Stand: Supervision (for safety)            Ambulation/Gait Ambulation/Gait assistance: Supervision Gait Distance (Feet): 200 Feet Assistive device: Rolling Labib Cwynar (2 wheeled) Gait Pattern/deviations: Step-through pattern;Decreased weight shift to left Gait velocity: Decreased Gait velocity interpretation: <1.8 ft/sec, indicate of risk for recurrent falls General Gait Details: Pt demos improved heel strike with no cueing required. Cues for  RW management.   Stairs             Wheelchair Mobility    Modified Rankin (Stroke Patients Only)       Balance Overall balance assessment: Modified Independent Sitting-balance support: No upper extremity supported Sitting balance-Leahy Scale: Good     Standing balance support: During functional activity Standing balance-Leahy Scale: Fair                              Cognition Arousal/Alertness: Awake/alert Behavior During Therapy: WFL for tasks assessed/performed Overall Cognitive Status: Within Functional Limits for tasks assessed                                        Exercises Total Joint Exercises Long Arc Quad: AROM;Strengthening;Both;10 reps;Seated General Exercises - Lower Extremity Hip Flexion/Marching: AROM;Strengthening;Both;10 reps;Seated Toe Raises: AROM;Strengthening;Both;10 reps;Seated Heel Raises: AROM;Strengthening;Both;10 reps;Seated    General Comments        Pertinent Vitals/Pain Pain Assessment: Faces Faces Pain Scale: Hurts a little bit Pain Location: L knee  Pain Descriptors / Indicators: Operative site guarding Pain Intervention(s): Monitored during session    Home Living                      Prior Function            PT Goals (current goals can now be found in the care plan section) Acute Rehab PT Goals Patient Stated Goal: to go home PT Goal Formulation: With patient Time For Goal Achievement: 07/04/20 Potential to Achieve Goals: Good Progress towards PT goals: Progressing toward goals    Frequency  7X/week      PT Plan Current plan remains appropriate    Co-evaluation              AM-PAC PT "6 Clicks" Mobility   Outcome Measure  Help needed turning from your back to your side while in a flat bed without using bedrails?: None Help needed moving from lying on your back to sitting on the side of a flat bed without using bedrails?: None Help needed moving to and  from a bed to a chair (including a wheelchair)?: None Help needed standing up from a chair using your arms (e.g., wheelchair or bedside chair)?: None Help needed to walk in hospital room?: None Help needed climbing 3-5 steps with a railing? : A Little 6 Click Score: 23    End of Session   Activity Tolerance: Patient tolerated treatment well Patient left: in bed;with call bell/phone within reach Nurse Communication: Mobility status PT Visit Diagnosis: Unsteadiness on feet (R26.81);Muscle weakness (generalized) (M62.81)     Time: 7218-2883 PT Time Calculation (min) (ACUTE ONLY): 35 min  Charges:  $Therapeutic Exercise: 8-22 mins $Therapeutic Activity: 8-22 mins                     Perrin Maltese, PT, DPT Acute Rehabilitation Services Pager [phone] Office [phone]    Chesley Noon K Allred 06/21/2020, 1:25 PM

## 2020-06-22 ENCOUNTER — Telehealth: Payer: Self-pay | Admitting: Orthopaedic Surgery

## 2020-06-22 ENCOUNTER — Encounter (HOSPITAL_COMMUNITY): Payer: Self-pay | Admitting: Orthopaedic Surgery

## 2020-06-22 NOTE — Telephone Encounter (Signed)
Pt called stating she put her paperwork through [employer] and then her paperwork was never faxed to her job and she never received a copy in the mail; she states when she calls she just gets an answering machine and this has been going on for over a week. Pt would like for [place] to try and reach out and find her paperwork and then call her back with an update   [phone]

## 2020-06-22 NOTE — Telephone Encounter (Signed)
Ic,lmvm advised [employer] faxed forms 10/11. Advised to allow several day for copy to arrive in the mail.

## 2020-06-26 ENCOUNTER — Encounter: Payer: Self-pay | Admitting: Orthopaedic Surgery

## 2020-06-30 ENCOUNTER — Encounter: Payer: Self-pay | Admitting: Orthopaedic Surgery

## 2020-06-30 ENCOUNTER — Telehealth: Payer: Self-pay | Admitting: Orthopaedic Surgery

## 2020-06-30 ENCOUNTER — Other Ambulatory Visit: Payer: Self-pay | Admitting: Orthopaedic Surgery

## 2020-06-30 MED ORDER — MAGNESIUM CITRATE PO SOLN: 1.0000 | Freq: Once | ORAL | 2 refills | Status: AC

## 2020-06-30 MED ORDER — GLYCERIN (ADULT) 2 G RE SUPP: 1.0000 | RECTAL | 0 refills | Status: DC | PRN

## 2020-06-30 NOTE — Telephone Encounter (Signed)
Duplicate msg. She sent a mychart msg which has already been routed to Dr Erlinda Hong.

## 2020-06-30 NOTE — Telephone Encounter (Signed)
Pt called stating she recently had surgery and has had 1 bowel movement in 2 weeks pt states she needs to Dr. Erlinda Hong to call in something to help with a bowel movement; she states she's tried everything that was on the recommended list and she even tried it all at once and nothing happened. Pt would like a CB to update her on what he will be sending in   [phone]

## 2020-07-05 ENCOUNTER — Ambulatory Visit (INDEPENDENT_AMBULATORY_CARE_PROVIDER_SITE_OTHER): Payer: Federal, State, Local not specified - PPO | Admitting: Orthopaedic Surgery

## 2020-07-05 ENCOUNTER — Other Ambulatory Visit: Payer: Self-pay

## 2020-07-05 ENCOUNTER — Encounter: Payer: Self-pay | Admitting: Orthopaedic Surgery

## 2020-07-05 VITALS — Ht 66.0 in | Wt 192.0 lb

## 2020-07-05 DIAGNOSIS — Z96652 Presence of left artificial knee joint: Secondary | ICD-10-CM

## 2020-07-05 MED ORDER — OXYCODONE-ACETAMINOPHEN 5-325 MG PO TABS: 1.0000 | ORAL_TABLET | Freq: Three times a day (TID) | ORAL | 0 refills | Status: DC | PRN

## 2020-07-05 NOTE — Progress Notes (Signed)
Post-Op Visit Note   Patient: Summer [name]           Date of Birth: 12/08/1950           MRN: [id number] Visit Date: 07/05/2020 PCP: Hoyt Koch, MD   Assessment & Plan:  Chief Complaint:  Chief Complaint  Patient presents with   Left Knee - Follow-up    Left total knee arthroplasty 06/20/2020   Visit Diagnoses:  1. Status post left knee replacement     Plan: Summer [name] is 2 weeks status post left total knee replacement.  She is doing well overall and has 1 remaining session of home health PT.  Range of motion is progressing well 5 to 96 degrees.  She is independent with gait and transfers and ADLs.  Surgical incision is healed without any signs of infection.  Mild swelling of the lower extremity.  Calf is nontender.  Percocet refilled today.  Referral for outpatient PT at our office was made today.  Recheck in 4 weeks.  Continue Xarelto for DVT prophylaxis.  Wound care instructions reviewed today.  Follow-Up Instructions: Return in about 4 weeks (around 08/02/2020).   Orders:  Orders Placed This Encounter  Procedures   Ambulatory referral to Physical Therapy   Meds ordered this encounter  Medications   oxyCODONE-acetaminophen (PERCOCET) 5-325 MG tablet    Sig: Take 1-2 tablets by mouth every 8 (eight) hours as needed for severe pain.    Dispense:  40 tablet    Refill:  0    Imaging: No results found.  PMFS History: Patient Active Problem List   Diagnosis Date Noted   Status post total left knee replacement 06/20/2020   Primary osteoarthritis of left knee 06/19/2020   Left knee pain 02/26/2020   Bilateral arm numbness and tingling while sleeping 02/26/2020   COVID-19 11/06/2019   Muscle cramps 05/12/2019   Cramps, muscle, general 04/23/2019   Tinnitus 04/16/2019   Chest pain of uncertain etiology 97/35/3299   Bilateral leg edema 02/10/2019   Port-A-Cath in place 01/30/2019   Anemia, iron deficiency 11/21/2018   Malignant  neoplasm of upper-outer quadrant of right breast in female, estrogen receptor positive ([place]) 11/17/2018   Itching 10/13/2018   Left eye pain 07/01/2018   Constipation 07/01/2018   Weight loss 07/22/2017   Memory loss 12/21/2016   Tremor 12/21/2016   Right sided temporal headache 12/21/2016   Trochanteric bursitis, right hip 10/26/2016   Pruritus 10/26/2016   Foot sprain, left, initial encounter 08/31/2016   Pain of right side of body 08/31/2016   Headache 04/24/2016   LLQ pain 04/24/2016   Glossitis 02/02/2016   Routine general medical examination at a health care facility 10/27/2015   Facial pain 10/06/2015   Trigeminal neuralgia of right side of face 11/30/2010   Morbid obesity ([place]) 11/09/2010   Fibromyalgia 11/09/2010   HEMATURIA, HX OF 11/14/2009   LOC OSTEOARTHROS NOT SPEC PRIM/SEC OTH SPEC SITE 06/27/2009   Allergic rhinitis 08/26/2008   VULVA INTRAEPITHELIAL NEOPLASIA, 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   Past Medical History:  Diagnosis Date   Abdominal pain, left lower quadrant 11/09/2009   Qualifier: Diagnosis of  By: Valma Cava LPN, Nancy     Allergic rhinitis 08/26/2008   Qualifier: Diagnosis of  By: Sherren Mocha MD, Dellis Filbert A    Anemia, iron deficiency 11/21/2018   Arthritis shoulders and back   BACK PAIN, CHRONIC 06/13/2007   Qualifier: Diagnosis of  By: Sherren Mocha MD, Dellis Filbert A    Borderline glaucoma NO  DROPS   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   Complication of anesthesia    Pt states having some "difficulty waking up"   Constipation 07/01/2018   Costochondritis 05/04/2014   Diverticulitis of colon 06/13/2007   S/p segmental colectomy for perforated diverticulitis    Diverticulosis    Facial pain 10/06/2015   Fibromyalgia    Foot sprain, left, initial encounter 08/31/2016   Glossitis 02/02/2016   Headache 04/24/2016   HEMATURIA UNSPECIFIED 06/25/2008   Qualifier: Diagnosis of  By: Sherren Mocha MD, Robet Leu, HX OF 11/14/2009    Qualifier: Diagnosis of  By: Regino Schultze CMA (AAMA), Apolonio Schneiders     History of kidney stones    History of kidney stones    History of shingles 2012--  no residual pain   Itching 10/13/2018   Left eye pain 07/01/2018   Left ureteral calculus    LLQ pain 04/24/2016   LOC OSTEOARTHROS NOT SPEC PRIM/SEC OTH [facility] SITE 06/27/2009   Qualifier: Diagnosis of  By: Sherren Mocha MD, Dellis Filbert A    Malignant neoplasm of upper-outer quadrant of right breast in female, estrogen receptor positive ([place]) 11/17/2018   MCI (mild cognitive impairment)    Memory loss 12/21/2016   Morbid obesity ([place]) 11/09/2010   Pain of right side of body 08/31/2016   Personal history of chemotherapy    Personal history of radiation therapy    Pruritus 10/26/2016   Renal disorder    Right sided temporal headache 12/21/2016   Tremor 12/21/2016   Trigeminal neuralgia RIGHT   Trigeminal neuralgia of right side of face 11/30/2010   Trochanteric bursitis, right hip 10/26/2016   Urgency of urination    Weight loss 07/22/2017   Zoster without complications 3/57/0177    Family History  Problem Relation Age of Onset   Heart disease Maternal Grandmother    Diabetes Mother    Breast cancer Maternal Aunt    Lupus Cousin    Colon cancer Neg Hx    Esophageal cancer Neg Hx    Rectal cancer Neg Hx    Stomach cancer Neg Hx     Past Surgical History:  Procedure Laterality Date   BREAST BIOPSY Right 11/11/2018   BREAST LUMPECTOMY Right 11/28/2018   BREAST LUMPECTOMY WITH RADIOACTIVE SEED AND SENTINEL LYMPH NODE BIOPSY Right 11/28/2018   Procedure: RIGHT BREAST LUMPECTOMY WITH RADIOACTIVE SEED AND RIGHT AXILLARY SENTINEL LYMPH NODE BIOPSY;  Surgeon: Excell Seltzer, MD;  Location: [place];  Service: General;  Laterality: Right;   CEREBRAL MICROVASCULAR DECOMPRESSION  07-05-2006   RIGHT TRIGEMINAL NERVE   CYSTOSCOPY/RETROGRADE/URETEROSCOPY/STONE EXTRACTION WITH BASKET  07/04/2012   Procedure:  CYSTOSCOPY/RETROGRADE/URETEROSCOPY/STONE EXTRACTION WITH BASKET;  Surgeon: Claybon Jabs, MD;  Location: [place];  Service: Urology;  Laterality: Left;   EXPLORATORY LAPAROTOMY/ RESECTION MID TO DISTAL SIGMOID AND PROXIMAL RECTUM/ END PROXIMAL SIGMOID COLOSTOMY  07-17-2006   PERFORATED DIVERTICULITIS WITH PERITONITIS   gamma knife  05/17/2016   for trigeminal neuralgia, [facility], Dr Salomon Fick   gamma knife  2019   IR CV LINE INJECTION  10/27/2019   PORTACATH PLACEMENT Left 11/28/2018   Procedure: INSERTION PORT-A-CATH WITH ULTRASOUND;  Surgeon: Excell Seltzer, MD;  Location: [place];  Service: General;  Laterality: Left;   portacath removal     RESECTION COLOSTOMY/ CLOSURE COLOSTOMY WITH COLOPROCTOSTOMY  02-20-2007   TONSILLECTOMY  AS CHILD   TOTAL KNEE ARTHROPLASTY Left 06/20/2020   Procedure: LEFT TOTAL KNEE ARTHROPLASTY;  Surgeon: Leandrew Koyanagi,  MD;  Location: [place];  Service: Orthopedics;  Laterality: Left;   URETEROSCOPY  07/04/2012   Procedure: URETEROSCOPY;  Surgeon: Claybon Jabs, MD;  Location: [facility];  Service: Urology;  Laterality: Left;   VAGINAL HYSTERECTOMY  1998   Partial   WISDOM TOOTH EXTRACTION     Social History   Occupational History    Employer: UNEMPLOYED   Occupation: HUMAN RESOURCES     Employer: [employer]  Tobacco Use   Smoking status: Never Smoker   Smokeless tobacco: Never Used  Scientific laboratory technician Use: Never used  Substance and Sexual Activity   Alcohol use: Yes    Comment: rarely   Drug use: No   Sexual activity: Not on file

## 2020-07-11 ENCOUNTER — Other Ambulatory Visit: Payer: Self-pay | Admitting: Physician Assistant

## 2020-07-11 ENCOUNTER — Encounter: Payer: Self-pay | Admitting: Rehabilitative and Restorative Service Providers"

## 2020-07-11 ENCOUNTER — Other Ambulatory Visit: Payer: Self-pay

## 2020-07-11 ENCOUNTER — Ambulatory Visit (INDEPENDENT_AMBULATORY_CARE_PROVIDER_SITE_OTHER): Payer: Federal, State, Local not specified - PPO | Admitting: Rehabilitative and Restorative Service Providers"

## 2020-07-11 DIAGNOSIS — R262 Difficulty in walking, not elsewhere classified: Secondary | ICD-10-CM | POA: Diagnosis not present

## 2020-07-11 DIAGNOSIS — M25562 Pain in left knee: Secondary | ICD-10-CM

## 2020-07-11 DIAGNOSIS — M6281 Muscle weakness (generalized): Secondary | ICD-10-CM

## 2020-07-11 DIAGNOSIS — R600 Localized edema: Secondary | ICD-10-CM

## 2020-07-11 DIAGNOSIS — G8929 Other chronic pain: Secondary | ICD-10-CM

## 2020-07-11 DIAGNOSIS — M25662 Stiffness of left knee, not elsewhere classified: Secondary | ICD-10-CM

## 2020-07-11 NOTE — Patient Instructions (Signed)
Access Code: [email or web address] URL: [email or web address] Date: 07/11/2020 Prepared by: Scot Jun  Exercises Seated Quad Set - 2 x daily - 7 x weekly - 1 sets - 10 reps - 5 hold Supine Heel Slide - 2 x daily - 7 x weekly - 2 sets - 10 reps - 2 hold Supine Knee Extension Mobilization with Weight - 1 x daily - 7 x weekly - 1 sets - 4 reps - to tolerance up to 15 mins hold Seated Long Arc Quad - 2 x daily - 7 x weekly - 3 sets - 10 reps

## 2020-07-11 NOTE — Therapy (Addendum)
[facility] [place], [place], [address] Phone: [phone]   Fax:  [phone]  Physical Therapy Evaluation  Patient Details  Name: Summer [name] MRN: [id number] Date of Birth: 11/17/1950 Referring Provider (PT): Dr. Erlinda Hong   Encounter Date: 07/11/2020   PT End of Session - 07/11/20 1010    Visit Number 1    Number of Visits 16    Date for PT Re-Evaluation 09/19/20    Authorization Type [place], Medicare    Authorization - Visit Number 1    Progress Note Due on Visit 10    PT Start Time 1015    PT Stop Time 1055    PT Time Calculation (min) 40 min    Activity Tolerance Patient limited by pain    Behavior During Therapy [facility] for tasks assessed/performed           Past Medical History:  Diagnosis Date  . Abdominal pain, left lower quadrant 11/09/2009   Qualifier: Diagnosis of  By: Valma Cava LPN, Izora Gala    . Allergic rhinitis 08/26/2008   Qualifier: Diagnosis of  By: Sherren Mocha MD, Jory Ee   . Anemia, iron deficiency 11/21/2018  . Arthritis shoulders and back  . BACK PAIN, CHRONIC 06/13/2007   Qualifier: Diagnosis of  By: Sherren Mocha MD, Jory Ee   . 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 Complication of anesthesia    Pt states having some "difficulty waking up"  . Constipation 07/01/2018  . Costochondritis 05/04/2014  . Diverticulitis of colon 06/13/2007   S/p segmental colectomy for perforated diverticulitis   . Diverticulosis   . Facial pain 10/06/2015  . Fibromyalgia   . Foot sprain, left, initial encounter 08/31/2016  . Glossitis 02/02/2016  . Headache 04/24/2016  . HEMATURIA UNSPECIFIED 06/25/2008   Qualifier: Diagnosis of  By: Sherren Mocha MD, Jory Ee   . HEMATURIA, HX OF 11/14/2009   Qualifier: Diagnosis of  By: Regino Schultze CMA (AAMA), Apolonio Schneiders    . History of kidney stones   . History of kidney stones   . History of shingles 2012--  no residual pain  . Itching 10/13/2018  . Left  eye pain 07/01/2018  . Left ureteral calculus   . LLQ pain 04/24/2016  . LOC OSTEOARTHROS NOT SPEC PRIM/SEC OTH [facility] SITE 06/27/2009   Qualifier: Diagnosis of  By: Sherren Mocha MD, Jory Ee   . Malignant neoplasm of upper-outer quadrant of right breast in female, estrogen receptor positive ([place]) 11/17/2018  . MCI (mild cognitive impairment)   . Memory loss 12/21/2016  . Morbid obesity ([place]) 11/09/2010  . Pain of right side of body 08/31/2016  . Personal history of chemotherapy   . Personal history of radiation therapy   . Pruritus 10/26/2016  . Renal disorder   . Right sided temporal headache 12/21/2016  . Tremor 12/21/2016  . Trigeminal neuralgia RIGHT  . Trigeminal neuralgia of right side of face 11/30/2010  . Trochanteric bursitis, right hip 10/26/2016  . Urgency of urination   . Weight loss 07/22/2017  . Zoster without complications 2/77/8242    Past Surgical History:  Procedure Laterality Date  . BREAST BIOPSY Right 11/11/2018  . BREAST LUMPECTOMY Right 11/28/2018  . BREAST LUMPECTOMY WITH RADIOACTIVE SEED AND SENTINEL LYMPH NODE BIOPSY Right 11/28/2018   Procedure: RIGHT BREAST LUMPECTOMY WITH RADIOACTIVE SEED AND RIGHT AXILLARY SENTINEL LYMPH NODE BIOPSY;  Surgeon: Excell Seltzer, MD;  Location: [place];  Service: General;  Laterality: Right;  .  CEREBRAL MICROVASCULAR DECOMPRESSION  07-05-2006   RIGHT TRIGEMINAL NERVE  . CYSTOSCOPY/RETROGRADE/URETEROSCOPY/STONE EXTRACTION WITH BASKET  07/04/2012   Procedure: CYSTOSCOPY/RETROGRADE/URETEROSCOPY/STONE EXTRACTION WITH BASKET;  Surgeon: Claybon Jabs, MD;  Location: [facility];  Service: Urology;  Laterality: Left;  . EXPLORATORY LAPAROTOMY/ RESECTION MID TO DISTAL SIGMOID AND PROXIMAL RECTUM/ END PROXIMAL SIGMOID COLOSTOMY  07-17-2006   PERFORATED DIVERTICULITIS WITH PERITONITIS  . gamma knife  05/17/2016   for trigeminal neuralgia, [facility], Dr Salomon Fick  . gamma knife  2019  . IR CV LINE INJECTION  10/27/2019  . PORTACATH  PLACEMENT Left 11/28/2018   Procedure: INSERTION PORT-A-CATH WITH ULTRASOUND;  Surgeon: Excell Seltzer, MD;  Location: [place];  Service: General;  Laterality: Left;  . portacath removal    . RESECTION COLOSTOMY/ CLOSURE COLOSTOMY WITH COLOPROCTOSTOMY  02-20-2007  . TONSILLECTOMY  AS CHILD  . TOTAL KNEE ARTHROPLASTY Left 06/20/2020   Procedure: LEFT TOTAL KNEE ARTHROPLASTY;  Surgeon: Leandrew Koyanagi, MD;  Location: [place];  Service: Orthopedics;  Laterality: Left;  . URETEROSCOPY  07/04/2012   Procedure: URETEROSCOPY;  Surgeon: Claybon Jabs, MD;  Location: [facility];  Service: Urology;  Laterality: Left;  Marland Kitchen VAGINAL HYSTERECTOMY  1998   Partial  . WISDOM TOOTH EXTRACTION      There were no vitals filed for this visit.    Subjective Assessment - 07/11/20 1009    Subjective Pt. comes to clinic s/p recent Lt TKA performed 06/20/2020.  Pt. stated doing pretty good at home.  Pt. stated stiffness in knee and pain c exercise, difficulty sleeping.  Pt. stated using SPC since getting home.    Limitations Sitting;Lifting;Standing;Walking;House hold activities    Patient Stated Goals Get back to walking, gardening, cleaning house, walking independent    Currently in Pain? Yes    Pain Score 2    pain at worst 10/10   Pain Location Knee    Pain Orientation Left    Pain Type Surgical pain    Aggravating Factors  squatting for cleaning shower. (Extreme difficulty c walking, standing, household activity per FOTO intake)    Pain Relieving Factors Pain medicine (minimal relief)              [facility] PT Assessment - 07/11/20 0001      Assessment   Medical Diagnosis L TKA     Referring Provider (PT) Dr. Erlinda Hong    Onset Date/Surgical Date 06/20/20    Hand Dominance Right      Precautions   Precautions None      Restrictions   Weight Bearing Restrictions No      Balance Screen   Has the patient fallen in the past 6 months No      [place]  residence    Living Arrangements Children    Available Help at Discharge Family    Type of [place] to enter    Entrance Stairs-Number of Steps 7 or 4    Entrance Stairs-Rails Right;Left;Can reach both    [place] One level    Additional Comments step to gait pattern on stairs      Prior Function   Level of Independence Independent    Leisure housework, gardening, walking for exercise (1-2 miles)      Cognition   Overall Cognitive Status Within Functional Limits for tasks assessed      Observation/Other Assessments   Focus on Therapeutic Outcomes (FOTO)  Intake 47%, expected 63%      Observation/Other Assessments-Edema    Edema --   localized edema noted in Lt knee     Functional Tests   Functional tests Single leg stance;Sit to Stand      Single Leg Stance   Comments Rt SLS 15 sec, Lt 5 seconds      Sit to Stand   Comments able to perform from 18 inch chair s UE c deviation to Rt LE      ROM / Strength   AROM / PROM / Strength AROM;PROM;Strength      AROM   Overall AROM Comments pain at end range flexion/extension.      AROM Assessment Site Knee    Right/Left Knee Left;Right    Left Knee Extension -15   LAQ measurement   Left Knee Flexion 83   supine heel slide measurement     PROM   Overall PROM Comments pain at end range flexion/extension    PROM Assessment Site Knee    Right/Left Knee Left;Right    Left Knee Extension -5    Left Knee Flexion 92      Strength   Strength Assessment Site Knee;Hip;Ankle    Right/Left Hip Right;Left    Right Hip Flexion 5/5    Left Hip Flexion 4/5    Right/Left Knee Left;Right    Right Knee Flexion 5/5    Right Knee Extension 5/5    Left Knee Flexion 2+/5    Left Knee Extension 2+/5    Right/Left Ankle Left;Right    Right Ankle Dorsiflexion 5/5    Left Ankle Dorsiflexion 5/5      Ambulation/Gait   Gait Comments SPC use in Rt UE, reduced step length on Lt LE due to reduced stance on Rt,  decreased gait speed                      Objective measurements completed on examination: See above findings.       [facility] Adult PT Treatment/Exercise - 07/11/20 0001      Exercises   Exercises Knee/Hip;Other Exercises    Other Exercises  Additional time spent c cues for intervention, demonstration      Knee/Hip Exercises: Stretches   Other Knee/Hip Stretches supine TKE heel prop 1 min x 3      Knee/Hip Exercises: Seated   Long Arc Quad Left;15 reps   c contralateral opposite movement   Other Seated Knee/Hip Exercises quad set 5 sec bilateral x 10      Knee/Hip Exercises: Supine   Heel Slides Left;15 reps      Manual Therapy   Manual therapy comments seated knee flexion MWM c distraction, IR Lt knee                  PT Education - 07/11/20 1010    Education Details HEP, POC    Person(s) Educated Patient    Methods Explanation;Demonstration;Verbal cues;Handout    Comprehension Returned demonstration;Verbalized understanding            PT Short Term Goals - 07/11/20 1011      PT SHORT TERM GOAL #1   Title Patient will demonstrate independent use of home exercise program to maintain progress from in clinic treatments.    Time 3    Period Weeks    Status New    Target Date 08/01/20             PT Long Term Goals -  07/11/20 1102      PT LONG TERM GOAL #1   Title Patient will demonstrate/report pain at worst less than or equal to 2/10 to facilitate minimal limitation in daily activity secondary to pain symptoms.    Time 10    Period Weeks    Status New    Target Date 09/19/20      PT LONG TERM GOAL #2   Title Patient will demonstrate independent use of home exercise program to facilitate ability to maintain/progress functional gains from skilled physical therapy services.    Time 10    Period Weeks    Status New    Target Date 09/19/20      PT LONG TERM GOAL #3   Title Pt. will demonstrate Lt knee AROM 0-110 deg to facilitate  ability to perform walking, stairs, squat, transfers at PLOF s limitation.    Time 10    Period Weeks    Status New    Target Date 09/19/20      PT LONG TERM GOAL #4   Title Pt. will demonstrate Lt LE MMT 5/5 throughout to facilitate reciprocal gait on stairs, independent ambulation.    Time 10    Period Weeks    Status New    Target Date 09/19/20      PT LONG TERM GOAL #5   Title Pt. will demonstrate Lt SLS > 15 seconds to facilitate stability on uneven surfaces ambulation independent.    Time 10    Period Weeks    Status New    Target Date 09/19/20      Additional Long Term Goals   Additional Long Term Goals Yes      PT LONG TERM GOAL #6   Title Pt. will demonstrate independent ambulation at PLOF community distances > 300 ft.    Time 10    Period Weeks    Status New    Target Date 09/19/20                  Plan - 07/11/20 1011    Clinical Impression Statement Patient is a 69 y.o. female who comes to clinic with complaints of Lt knee pain s/p Lt TKA 06/20/2020 with mobility, strength and movement coordination deficits that impair their ability to perform usual daily and recreational functional activities without increase difficulty/symptoms at this time.  Patient to benefit from skilled PT services to address impairments and limitations to improve to previous level of function without restriction secondary to condition.    Personal Factors and Comorbidities Comorbidity 2    Comorbidities fibromyalgia, obesity    Examination-Activity Limitations Bathing;Sit;Sleep;Bed Mobility;Bend;Squat;Stairs;Carry;Stand;Transfers;Lift;Locomotion Level    Examination-Participation Restrictions Yard Work;Cleaning;Community Activity    Stability/Clinical Decision Making Stable/Uncomplicated    Clinical Decision Making Low    Rehab Potential Good    PT Frequency Other (comment)   2x-3x/week   PT Duration --   10 weeks   PT Treatment/Interventions ADLs/Self Care Home  Management;Cryotherapy;Electrical Stimulation;Iontophoresis 4mg /ml Dexamethasone;Moist Heat;Balance training;Therapeutic exercise;Therapeutic activities;Functional mobility training;Stair training;Gait training;Ultrasound;Neuromuscular re-education;Patient/family education;Manual techniques;Vasopneumatic Device;Taping;Dry needling;Passive range of motion;Joint Manipulations    PT Next Visit Plan Review, HEP, progress mobility c manual/ther ex, strength, balance as able    PT Home Exercise Plan [email or web address]    Consulted and Agree with Plan of Care Patient           Patient will benefit from skilled therapeutic intervention in order to improve the following deficits and impairments:  Abnormal gait, Decreased endurance, Hypomobility, Increased edema, Decreased  activity tolerance, Decreased strength, Pain, Increased muscle spasms, Difficulty walking, Decreased mobility, Decreased balance, Decreased range of motion, Improper body mechanics, Impaired perceived functional ability, Impaired flexibility, Decreased coordination  Visit Diagnosis: Chronic pain of left knee - Plan: PT plan of care cert/re-cert  Muscle weakness (generalized) - Plan: PT plan of care cert/re-cert  Difficulty in walking, not elsewhere classified - Plan: PT plan of care cert/re-cert  Localized edema - Plan: PT plan of care cert/re-cert  Stiffness of left knee, not elsewhere classified - Plan: PT plan of care cert/re-cert     Problem List Patient Active Problem List   Diagnosis Date Noted  . Status post total left knee replacement 06/20/2020  . Primary osteoarthritis of left knee 06/19/2020  . Left knee pain 02/26/2020  . Bilateral arm numbness and tingling while sleeping 02/26/2020  . COVID-19 11/06/2019  . Muscle cramps 05/12/2019  . Cramps, muscle, general 04/23/2019  . Tinnitus 04/16/2019  . Chest pain of uncertain etiology 97/35/3299  . Bilateral leg edema 02/10/2019  . Port-A-Cath in place 01/30/2019  . Anemia,  iron deficiency 11/21/2018  . Malignant neoplasm of upper-outer quadrant of right breast in female, estrogen receptor positive ([place]) 11/17/2018  . Itching 10/13/2018  . Left eye pain 07/01/2018  . Constipation 07/01/2018  . Weight loss 07/22/2017  . Memory loss 12/21/2016  . Tremor 12/21/2016  . Right sided temporal headache 12/21/2016  . Trochanteric bursitis, right hip 10/26/2016  . Pruritus 10/26/2016  . Foot sprain, left, initial encounter 08/31/2016  . Pain of right side of body 08/31/2016  . Headache 04/24/2016  . LLQ pain 04/24/2016  . Glossitis 02/02/2016  . Routine general medical examination at a health care facility 10/27/2015  . Facial pain 10/06/2015  . Trigeminal neuralgia of right side of face 11/30/2010  . Morbid obesity ([place]) 11/09/2010  . Fibromyalgia 11/09/2010  . HEMATURIA, HX OF 11/14/2009  . LOC OSTEOARTHROS NOT SPEC PRIM/SEC OTH [facility] SITE 06/27/2009  . Allergic rhinitis 08/26/2008  . 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 [place], [place], [address] Phone: [phone]   Fax:  [phone]  Name: Summer [name] MRN: [id number] Date of Birth: [birth year]

## 2020-07-12 ENCOUNTER — Encounter: Payer: Self-pay | Admitting: *Deleted

## 2020-07-12 ENCOUNTER — Other Ambulatory Visit: Payer: Self-pay | Admitting: Neurology

## 2020-07-15 ENCOUNTER — Encounter: Payer: Self-pay | Admitting: Orthopaedic Surgery

## 2020-07-15 ENCOUNTER — Other Ambulatory Visit: Payer: Self-pay

## 2020-07-15 ENCOUNTER — Encounter: Payer: Self-pay | Admitting: Rehabilitative and Restorative Service Providers"

## 2020-07-15 ENCOUNTER — Ambulatory Visit (INDEPENDENT_AMBULATORY_CARE_PROVIDER_SITE_OTHER): Payer: Federal, State, Local not specified - PPO | Admitting: Rehabilitative and Restorative Service Providers"

## 2020-07-15 DIAGNOSIS — M25662 Stiffness of left knee, not elsewhere classified: Secondary | ICD-10-CM | POA: Diagnosis not present

## 2020-07-15 DIAGNOSIS — R262 Difficulty in walking, not elsewhere classified: Secondary | ICD-10-CM

## 2020-07-15 DIAGNOSIS — M25562 Pain in left knee: Secondary | ICD-10-CM | POA: Diagnosis not present

## 2020-07-15 DIAGNOSIS — G8929 Other chronic pain: Secondary | ICD-10-CM

## 2020-07-15 DIAGNOSIS — M6281 Muscle weakness (generalized): Secondary | ICD-10-CM | POA: Diagnosis not present

## 2020-07-15 DIAGNOSIS — R600 Localized edema: Secondary | ICD-10-CM

## 2020-07-15 NOTE — Therapy (Signed)
[facility] Therapy [address] [place], [place], [address] Phone: [phone]   Fax:  [phone]  Physical Therapy Treatment  Patient Details  Name: Summer [name] MRN: [id number] Date of Birth: [birth year] Referring Provider (PT): Dr. Erlinda Hong   Encounter Date: 07/15/2020   PT End of Session - 07/15/20 1337    Visit Number 2    Number of Visits 16    Date for PT Re-Evaluation 09/19/20    Authorization Type [place], Medicare    Authorization - Visit Number 2    Progress Note Due on Visit 10    PT Start Time 1443    PT Stop Time 1338    PT Time Calculation (min) 40 min    Activity Tolerance No increased pain;Patient tolerated treatment well    Behavior During Therapy [facility] for tasks assessed/performed           Past Medical History:  Diagnosis Date  . Abdominal pain, left lower quadrant 11/09/2009   Qualifier: Diagnosis of  By: Summer [name]    . Allergic rhinitis 08/26/2008   Qualifier: Diagnosis of  By: Summer [name]   . Anemia, iron deficiency 11/21/2018  . Arthritis shoulders and back  . BACK PAIN, CHRONIC 06/13/2007   Qualifier: Diagnosis of  By: Summer [name]   . 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 Complication of anesthesia    Pt states having some "difficulty waking up"  . Constipation 07/01/2018  . Costochondritis 05/04/2014  . Diverticulitis of colon 06/13/2007   S/p segmental colectomy for perforated diverticulitis   . Diverticulosis   . Facial pain 10/06/2015  . Fibromyalgia   . Foot sprain, left, initial encounter 08/31/2016  . Glossitis 02/02/2016  . Headache 04/24/2016  . HEMATURIA UNSPECIFIED 06/25/2008   Qualifier: Diagnosis of  By: Summer [name]   . HEMATURIA, HX OF 11/14/2009   Qualifier: Diagnosis of  By: Summer [name]    . History of kidney stones   . History of kidney stones   . History of shingles 2012--  no residual pain  .  Itching 10/13/2018  . Left eye pain 07/01/2018  . Left ureteral calculus   . LLQ pain 04/24/2016  . LOC OSTEOARTHROS NOT SPEC PRIM/SEC OTH [facility] SITE 06/27/2009   Qualifier: Diagnosis of  By: Summer [name]   . Malignant neoplasm of upper-outer quadrant of right breast in female, estrogen receptor positive ([place]) 11/17/2018  . MCI (mild cognitive impairment)   . Memory loss 12/21/2016  . Morbid obesity ([place]) 11/09/2010  . Pain of right side of body 08/31/2016  . Personal history of chemotherapy   . Personal history of radiation therapy   . Pruritus 10/26/2016  . Renal disorder   . Right sided temporal headache 12/21/2016  . Tremor 12/21/2016  . Trigeminal neuralgia RIGHT  . Trigeminal neuralgia of right side of face 11/30/2010  . Trochanteric bursitis, right hip 10/26/2016  . Urgency of urination   . Weight loss 07/22/2017  . Zoster without complications 1/54/0086    Past Surgical History:  Procedure Laterality Date  . BREAST BIOPSY Right 11/11/2018  . BREAST LUMPECTOMY Right 11/28/2018  . BREAST LUMPECTOMY WITH RADIOACTIVE SEED AND SENTINEL LYMPH NODE BIOPSY Right 11/28/2018   Procedure: RIGHT BREAST LUMPECTOMY WITH RADIOACTIVE SEED AND RIGHT AXILLARY SENTINEL LYMPH NODE BIOPSY;  Surgeon: Summer [name];  Location: [place];  Service: General;  Laterality: Right;  . CEREBRAL MICROVASCULAR DECOMPRESSION  07-05-2006   RIGHT TRIGEMINAL NERVE  . CYSTOSCOPY/RETROGRADE/URETEROSCOPY/STONE EXTRACTION WITH BASKET  07/04/2012   Procedure: CYSTOSCOPY/RETROGRADE/URETEROSCOPY/STONE EXTRACTION WITH BASKET;  Surgeon: Summer [name];  Location: [facility];  Service: Urology;  Laterality: Left;  . EXPLORATORY LAPAROTOMY/ RESECTION MID TO DISTAL SIGMOID AND PROXIMAL RECTUM/ END PROXIMAL SIGMOID COLOSTOMY  07-17-2006   PERFORATED DIVERTICULITIS WITH PERITONITIS  . gamma knife  05/17/2016   for trigeminal neuralgia, [facility], Dr Summer [name]  . gamma knife  2019  . IR CV LINE INJECTION   10/27/2019  . PORTACATH PLACEMENT Left 11/28/2018   Procedure: INSERTION PORT-A-CATH WITH ULTRASOUND;  Surgeon: Summer [name];  Location: [place];  Service: General;  Laterality: Left;  . portacath removal    . RESECTION COLOSTOMY/ CLOSURE COLOSTOMY WITH COLOPROCTOSTOMY  02-20-2007  . TONSILLECTOMY  AS CHILD  . TOTAL KNEE ARTHROPLASTY Left 06/20/2020   Procedure: LEFT TOTAL KNEE ARTHROPLASTY;  Surgeon: Summer [name];  Location: [place];  Service: Orthopedics;  Laterality: Left;  . URETEROSCOPY  07/04/2012   Procedure: URETEROSCOPY;  Surgeon: Summer [name];  Location: [facility];  Service: Urology;  Laterality: Left;  Marland Kitchen VAGINAL HYSTERECTOMY  1998   Partial  . WISDOM TOOTH EXTRACTION      There were no vitals filed for this visit.   Subjective Assessment - 07/15/20 1330    Subjective [name] notes good early HEP compliance.  Sleep has been difficult.    Limitations Sitting;Lifting;Standing;Walking;House hold activities    Patient Stated Goals Get back to walking, gardening, cleaning house, walking independent    Currently in Pain? Yes    Pain Score 2     Pain Location Knee    Pain Orientation Left    Pain Descriptors / Indicators Tightness    Pain Type Surgical pain    Pain Onset 1 to 4 weeks ago    Pain Frequency Constant    Aggravating Factors  Sleeping, prolonged postures and WB    Pain Relieving Factors Ice, pain meds and exercise    Effect of Pain on Daily Activities Unable to complete normal ADLs as quickly as she would like    Multiple Pain Sites No              [facility] PT Assessment - 07/15/20 0001      AROM   Left Knee Extension -7    Left Knee Flexion 97                         [facility] Adult PT Treatment/Exercise - 07/15/20 0001      Exercises   Exercises Knee/Hip      Knee/Hip Exercises: Stretches   Other Knee/Hip Stretches Tailgate knee flexion AROM 3 minutes X 2      Knee/Hip Exercises: Aerobic   Recumbent  Bike Seat 7 for 8 minutes      Knee/Hip Exercises: Supine   Quad Sets Strengthening;Both;3 sets;10 reps   5 seconds                 PT Education - 07/15/20 1333    Education Details Reviewed HEP and added activities to clinic program.    Person(s) Educated Patient    Methods Explanation;Demonstration;Tactile cues;Verbal cues    Comprehension Verbal cues required;Returned demonstration;Verbalized understanding;Tactile cues required            PT Short Term Goals - 07/15/20 1333  PT SHORT TERM GOAL #1   Title Patient will demonstrate independent use of home exercise program to maintain progress from in clinic treatments.    Time 3    Period Weeks    Status On-going    Target Date 08/01/20             PT Long Term Goals - 07/15/20 1333      PT LONG TERM GOAL #1   Title Patient will demonstrate/report pain at worst less than or equal to 2/10 to facilitate minimal limitation in daily activity secondary to pain symptoms.    Time 10    Period Weeks    Status On-going      PT LONG TERM GOAL #2   Title Patient will demonstrate independent use of home exercise program to facilitate ability to maintain/progress functional gains from skilled physical therapy services.    Time 10    Period Weeks    Status On-going      PT LONG TERM GOAL #3   Title Pt. will demonstrate Lt knee AROM 0-110 deg to facilitate ability to perform walking, stairs, squat, transfers at PLOF s limitation.    Time 10    Period Weeks    Status On-going      PT LONG TERM GOAL #4   Title Pt. will demonstrate Lt LE MMT 5/5 throughout to facilitate reciprocal gait on stairs, independent ambulation.    Time 10    Period Weeks    Status On-going      PT LONG TERM GOAL #5   Title Pt. will demonstrate Lt SLS > 15 seconds to facilitate stability on uneven surfaces ambulation independent.    Time 10    Period Weeks    Status On-going      PT LONG TERM GOAL #6   Title Pt. will demonstrate  independent ambulation at PLOF community distances > 300 ft.    Time 10    Period Weeks    Status On-going                 Plan - 07/15/20 1338    Clinical Impression Statement Summer [name] is already showing significant progress with her AROM.  Home program compliance has been good and she is only using the cane outside the home.  Her prognosis to make continued progress and meet long-term goals is good with continued work.    Personal Factors and Comorbidities Comorbidity 2    Comorbidities Fibromyalgia, obesity    Examination-Activity Limitations Bathing;Sit;Sleep;Bed Mobility;Bend;Squat;Stairs;Carry;Stand;Transfers;Lift;Locomotion Level    Examination-Participation Restrictions Yard Work;Cleaning;Community Activity    Stability/Clinical Decision Making Stable/Uncomplicated    Rehab Potential Good    PT Frequency Other (comment)   2x-3x/week   PT Duration --   10 weeks   PT Treatment/Interventions ADLs/Self Care Home Management;Cryotherapy;Electrical Stimulation;Iontophoresis 4mg /ml Dexamethasone;Moist Heat;Balance training;Therapeutic exercise;Therapeutic activities;Functional mobility training;Stair training;Gait training;Ultrasound;Neuromuscular re-education;Patient/family education;Manual techniques;Vasopneumatic Device;Taping;Dry needling;Passive range of motion;Joint Manipulations    PT Next Visit Plan Review, HEP, progress mobility c manual/ther ex, strength, balance as able    PT Home Exercise Plan [id number]    Consulted and Agree with Plan of Care Patient           Patient will benefit from skilled therapeutic intervention in order to improve the following deficits and impairments:  Abnormal gait, Decreased endurance, Hypomobility, Increased edema, Decreased activity tolerance, Decreased strength, Pain, Increased muscle spasms, Difficulty walking, Decreased mobility, Decreased balance, Decreased range of motion, Improper body mechanics, Impaired perceived functional ability,  Impaired  flexibility, Decreased coordination  Visit Diagnosis: Difficulty in walking, not elsewhere classified  Stiffness of left knee, not elsewhere classified  Chronic pain of left knee  Muscle weakness (generalized)  Localized edema     Problem List Patient Active Problem List   Diagnosis Date Noted  . Status post total left knee replacement 06/20/2020  . Primary osteoarthritis of left knee 06/19/2020  . Left knee pain 02/26/2020  . Bilateral arm numbness and tingling while sleeping 02/26/2020  . COVID-19 11/06/2019  . Muscle cramps 05/12/2019  . Cramps, muscle, general 04/23/2019  . Tinnitus 04/16/2019  . Chest pain of uncertain etiology 06/26/5101  . Bilateral leg edema 02/10/2019  . Port-A-Cath in place 01/30/2019  . Anemia, iron deficiency 11/21/2018  . Malignant neoplasm of upper-outer quadrant of right breast in female, estrogen receptor positive ([place]) 11/17/2018  . Itching 10/13/2018  . Left eye pain 07/01/2018  . Constipation 07/01/2018  . Weight loss 07/22/2017  . Memory loss 12/21/2016  . Tremor 12/21/2016  . Right sided temporal headache 12/21/2016  . Trochanteric bursitis, right hip 10/26/2016  . Pruritus 10/26/2016  . Foot sprain, left, initial encounter 08/31/2016  . Pain of right side of body 08/31/2016  . Headache 04/24/2016  . LLQ pain 04/24/2016  . Glossitis 02/02/2016  . Routine general medical examination at a health care facility 10/27/2015  . Facial pain 10/06/2015  . Trigeminal neuralgia of right side of face 11/30/2010  . Morbid obesity ([place]) 11/09/2010  . Fibromyalgia 11/09/2010  . HEMATURIA, HX OF 11/14/2009  . LOC OSTEOARTHROS NOT SPEC PRIM/SEC OTH [facility] SITE 06/27/2009  . Allergic rhinitis 08/26/2008  . VULVA INTRAEPITHELIAL NEOPLASIA, 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    Farley Ly PT, MPT 07/15/2020, 1:41 PM  [facility] Physical Therapy [address] [place], [place], [address] Phone: [phone]   Fax:   [phone]  Name: Summer [name] MRN: [id number] Date of Birth: 1951-08-10

## 2020-07-16 ENCOUNTER — Encounter: Payer: Self-pay | Admitting: Orthopaedic Surgery

## 2020-07-18 ENCOUNTER — Encounter: Payer: Self-pay | Admitting: *Deleted

## 2020-07-18 NOTE — Telephone Encounter (Signed)
Yes that's all fine to do.  Thanks.

## 2020-07-18 NOTE — Telephone Encounter (Signed)
Yes that's fine 

## 2020-07-19 ENCOUNTER — Encounter: Payer: Self-pay | Admitting: Orthopaedic Surgery

## 2020-07-20 ENCOUNTER — Encounter: Payer: Self-pay | Admitting: Rehabilitative and Restorative Service Providers"

## 2020-07-20 ENCOUNTER — Other Ambulatory Visit: Payer: Self-pay

## 2020-07-20 ENCOUNTER — Telehealth: Payer: Self-pay | Admitting: Neurology

## 2020-07-20 ENCOUNTER — Telehealth: Payer: Self-pay | Admitting: Orthopaedic Surgery

## 2020-07-20 ENCOUNTER — Ambulatory Visit (INDEPENDENT_AMBULATORY_CARE_PROVIDER_SITE_OTHER): Payer: Federal, State, Local not specified - PPO | Admitting: Rehabilitative and Restorative Service Providers"

## 2020-07-20 DIAGNOSIS — M25562 Pain in left knee: Secondary | ICD-10-CM | POA: Diagnosis not present

## 2020-07-20 DIAGNOSIS — R262 Difficulty in walking, not elsewhere classified: Secondary | ICD-10-CM

## 2020-07-20 DIAGNOSIS — M6281 Muscle weakness (generalized): Secondary | ICD-10-CM

## 2020-07-20 DIAGNOSIS — R600 Localized edema: Secondary | ICD-10-CM

## 2020-07-20 DIAGNOSIS — M25662 Stiffness of left knee, not elsewhere classified: Secondary | ICD-10-CM | POA: Diagnosis not present

## 2020-07-20 DIAGNOSIS — G8929 Other chronic pain: Secondary | ICD-10-CM

## 2020-07-20 NOTE — Telephone Encounter (Signed)
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  If you could get her that note and send it to her through that email in [place] she will go ahead and forward that on to them thank you. Pt also states she has a knot on her thigh   [phone]

## 2020-07-20 NOTE — Telephone Encounter (Signed)
My Chart message for patient as well. Work note entered that anticipate patient out of work 12 weeks post op.  Sent to patient via My Chart per her request.

## 2020-07-20 NOTE — Telephone Encounter (Signed)
..   Pt understands that although there may be some limitations with this type of visit, we will take all precautions to reduce any security or privacy concerns.  Pt understands that this will be treated like an in office visit and we will file with pt's insurance, and there may be a patient responsible charge related to this service. ? ?

## 2020-07-20 NOTE — Therapy (Signed)
[facility] [address] [place], [place], [address] Phone: [phone]   Fax:  [phone]  Physical Therapy Treatment  Patient Details  Name: Summer [name] MRN: [id number] Date of Birth: 1951/09/08 Referring Provider (PT): Dr. Erlinda Hong   Encounter Date: 07/20/2020   PT End of Session - 07/20/20 1439    Visit Number 3    Number of Visits 16    Date for PT Re-Evaluation 09/19/20    Authorization Type [place], Medicare    Authorization - Visit Number 3    Progress Note Due on Visit 10    PT Start Time 1430    PT Stop Time 1514    PT Time Calculation (min) 44 min    Activity Tolerance Patient tolerated treatment well    Behavior During Therapy [facility] for tasks assessed/performed           Past Medical History:  Diagnosis Date  . Abdominal pain, left lower quadrant 11/09/2009   Qualifier: Diagnosis of  By: Valma Cava LPN, Izora Gala    . Allergic rhinitis 08/26/2008   Qualifier: Diagnosis of  By: Sherren Mocha MD, Jory Ee   . Anemia, iron deficiency 11/21/2018  . Arthritis shoulders and back  . BACK PAIN, CHRONIC 06/13/2007   Qualifier: Diagnosis of  By: Sherren Mocha MD, Jory Ee   . 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 Complication of anesthesia    Pt states having some "difficulty waking up"  . Constipation 07/01/2018  . Costochondritis 05/04/2014  . Diverticulitis of colon 06/13/2007   S/p segmental colectomy for perforated diverticulitis   . Diverticulosis   . Facial pain 10/06/2015  . Fibromyalgia   . Foot sprain, left, initial encounter 08/31/2016  . Glossitis 02/02/2016  . Headache 04/24/2016  . HEMATURIA UNSPECIFIED 06/25/2008   Qualifier: Diagnosis of  By: Sherren Mocha MD, Jory Ee   . HEMATURIA, HX OF 11/14/2009   Qualifier: Diagnosis of  By: Regino Schultze CMA (AAMA), Apolonio Schneiders    . History of kidney stones   . History of kidney stones   . History of shingles 2012--  no residual pain  . Itching 10/13/2018    . Left eye pain 07/01/2018  . Left ureteral calculus   . LLQ pain 04/24/2016  . LOC OSTEOARTHROS NOT SPEC PRIM/SEC OTH [facility] SITE 06/27/2009   Qualifier: Diagnosis of  By: Sherren Mocha MD, Jory Ee   . Malignant neoplasm of upper-outer quadrant of right breast in female, estrogen receptor positive ([place]) 11/17/2018  . MCI (mild cognitive impairment)   . Memory loss 12/21/2016  . Morbid obesity ([place]) 11/09/2010  . Pain of right side of body 08/31/2016  . Personal history of chemotherapy   . Personal history of radiation therapy   . Pruritus 10/26/2016  . Renal disorder   . Right sided temporal headache 12/21/2016  . Tremor 12/21/2016  . Trigeminal neuralgia RIGHT  . Trigeminal neuralgia of right side of face 11/30/2010  . Trochanteric bursitis, right hip 10/26/2016  . Urgency of urination   . Weight loss 07/22/2017  . Zoster without complications 9/93/7169    Past Surgical History:  Procedure Laterality Date  . BREAST BIOPSY Right 11/11/2018  . BREAST LUMPECTOMY Right 11/28/2018  . BREAST LUMPECTOMY WITH RADIOACTIVE SEED AND SENTINEL LYMPH NODE BIOPSY Right 11/28/2018   Procedure: RIGHT BREAST LUMPECTOMY WITH RADIOACTIVE SEED AND RIGHT AXILLARY SENTINEL LYMPH NODE BIOPSY;  Surgeon: Excell Seltzer, MD;  Location: [place];  Service: General;  Laterality:  Right;  . CEREBRAL MICROVASCULAR DECOMPRESSION  07-05-2006   RIGHT TRIGEMINAL NERVE  . CYSTOSCOPY/RETROGRADE/URETEROSCOPY/STONE EXTRACTION WITH BASKET  07/04/2012   Procedure: CYSTOSCOPY/RETROGRADE/URETEROSCOPY/STONE EXTRACTION WITH BASKET;  Surgeon: Claybon Jabs, MD;  Location: [facility];  Service: Urology;  Laterality: Left;  . EXPLORATORY LAPAROTOMY/ RESECTION MID TO DISTAL SIGMOID AND PROXIMAL RECTUM/ END PROXIMAL SIGMOID COLOSTOMY  07-17-2006   PERFORATED DIVERTICULITIS WITH PERITONITIS  . gamma knife  05/17/2016   for trigeminal neuralgia, [facility], Dr Salomon Fick  . gamma knife  2019  . IR CV LINE INJECTION  10/27/2019  .  PORTACATH PLACEMENT Left 11/28/2018   Procedure: INSERTION PORT-A-CATH WITH ULTRASOUND;  Surgeon: Excell Seltzer, MD;  Location: [place];  Service: General;  Laterality: Left;  . portacath removal    . RESECTION COLOSTOMY/ CLOSURE COLOSTOMY WITH COLOPROCTOSTOMY  02-20-2007  . TONSILLECTOMY  AS CHILD  . TOTAL KNEE ARTHROPLASTY Left 06/20/2020   Procedure: LEFT TOTAL KNEE ARTHROPLASTY;  Surgeon: Leandrew Koyanagi, MD;  Location: [place];  Service: Orthopedics;  Laterality: Left;  . URETEROSCOPY  07/04/2012   Procedure: URETEROSCOPY;  Surgeon: Claybon Jabs, MD;  Location: [facility];  Service: Urology;  Laterality: Left;  Marland Kitchen VAGINAL HYSTERECTOMY  1998   Partial  . WISDOM TOOTH EXTRACTION      There were no vitals filed for this visit.   Subjective Assessment - 07/20/20 1438    Subjective Pt. stated feeling Lt knee was tight and stiff today.  Pt. stated notcing a knot of thigh today.    Limitations Sitting;Lifting;Standing;Walking;House hold activities    Patient Stated Goals Get back to walking, gardening, cleaning house, walking independent    Currently in Pain? --   stiffness   Pain Score 0-No pain    Pain Location Knee    Pain Orientation Left    Pain Descriptors / Indicators Tightness;Sore    Pain Type Surgical pain    Pain Onset 1 to 4 weeks ago    Pain Frequency Intermittent    Aggravating Factors  prolonged postures, inactivity    Pain Relieving Factors light movement                             [facility] Adult PT Treatment/Exercise - 07/20/20 0001      Neuro Re-ed    Neuro Re-ed Details  fwd/rev ambulation for WB 10 ft x 4 each way, retro step x 20 Lt LE posterior, church pew anterior/post shift 20x      Knee/Hip Exercises: Clinical research associate 30 seconds;3 reps;Both   incline   Other Knee/Hip Stretches TKE 5 mins supine      Knee/Hip Exercises: Aerobic   Recumbent Bike seat 4 reverse x 2 mins, fwd x 4 mins      Knee/Hip Exercises:  Seated   Other Seated Knee/Hip Exercises SLR 2 x 10 bilateral      Manual Therapy   Manual therapy comments seated knee flexion MWM c distraction, IR Lt knee                    PT Short Term Goals - 07/15/20 1333      PT SHORT TERM GOAL #1   Title Patient will demonstrate independent use of home exercise program to maintain progress from in clinic treatments.    Time 3    Period Weeks    Status On-going    Target Date 08/01/20  PT Long Term Goals - 07/15/20 1333      PT LONG TERM GOAL #1   Title Patient will demonstrate/report pain at worst less than or equal to 2/10 to facilitate minimal limitation in daily activity secondary to pain symptoms.    Time 10    Period Weeks    Status On-going      PT LONG TERM GOAL #2   Title Patient will demonstrate independent use of home exercise program to facilitate ability to maintain/progress functional gains from skilled physical therapy services.    Time 10    Period Weeks    Status On-going      PT LONG TERM GOAL #3   Title Pt. will demonstrate Lt knee AROM 0-110 deg to facilitate ability to perform walking, stairs, squat, transfers at PLOF s limitation.    Time 10    Period Weeks    Status On-going      PT LONG TERM GOAL #4   Title Pt. will demonstrate Lt LE MMT 5/5 throughout to facilitate reciprocal gait on stairs, independent ambulation.    Time 10    Period Weeks    Status On-going      PT LONG TERM GOAL #5   Title Pt. will demonstrate Lt SLS > 15 seconds to facilitate stability on uneven surfaces ambulation independent.    Time 10    Period Weeks    Status On-going      PT LONG TERM GOAL #6   Title Pt. will demonstrate independent ambulation at PLOF community distances > 300 ft.    Time 10    Period Weeks    Status On-going                 Plan - 07/20/20 1439    Clinical Impression Statement Ambulation c SPC at this time.  Improving overall mobility in Lt LE at this time as well  as improving quad contraction activation.  Pt. continued to demonstrate necessity for continued skilled PT services.    Personal Factors and Comorbidities Comorbidity 2    Comorbidities Fibromyalgia, obesity    Examination-Activity Limitations Bathing;Sit;Sleep;Bed Mobility;Bend;Squat;Stairs;Carry;Stand;Transfers;Lift;Locomotion Level    Examination-Participation Restrictions Yard Work;Cleaning;Community Activity    Stability/Clinical Decision Making Stable/Uncomplicated    Rehab Potential Good    PT Frequency Other (comment)   2x-3x/week   PT Duration --   10 weeks   PT Treatment/Interventions ADLs/Self Care Home Management;Cryotherapy;Electrical Stimulation;Iontophoresis 4mg /ml Dexamethasone;Moist Heat;Balance training;Therapeutic exercise;Therapeutic activities;Functional mobility training;Stair training;Gait training;Ultrasound;Neuromuscular re-education;Patient/family education;Manual techniques;Vasopneumatic Device;Taping;Dry needling;Passive range of motion;Joint Manipulations    PT Next Visit Plan Progressive mobility, non compliant surface balance, WB strengthening.    PT Home Exercise Plan [id number]    Consulted and Agree with Plan of Care Patient           Patient will benefit from skilled therapeutic intervention in order to improve the following deficits and impairments:  Abnormal gait, Decreased endurance, Hypomobility, Increased edema, Decreased activity tolerance, Decreased strength, Pain, Increased muscle spasms, Difficulty walking, Decreased mobility, Decreased balance, Decreased range of motion, Improper body mechanics, Impaired perceived functional ability, Impaired flexibility, Decreased coordination  Visit Diagnosis: Localized edema  Muscle weakness (generalized)  Chronic pain of left knee  Stiffness of left knee, not elsewhere classified  Difficulty in walking, not elsewhere classified     Problem List Patient Active Problem List   Diagnosis Date Noted  .  Status post total left knee replacement 06/20/2020  . Primary osteoarthritis of left knee 06/19/2020  . Left  knee pain 02/26/2020  . Bilateral arm numbness and tingling while sleeping 02/26/2020  . COVID-19 11/06/2019  . Muscle cramps 05/12/2019  . Cramps, muscle, general 04/23/2019  . Tinnitus 04/16/2019  . Chest pain of uncertain etiology 63/84/6659  . Bilateral leg edema 02/10/2019  . Port-A-Cath in place 01/30/2019  . Anemia, iron deficiency 11/21/2018  . Malignant neoplasm of upper-outer quadrant of right breast in female, estrogen receptor positive ([place]) 11/17/2018  . Itching 10/13/2018  . Left eye pain 07/01/2018  . Constipation 07/01/2018  . Weight loss 07/22/2017  . Memory loss 12/21/2016  . Tremor 12/21/2016  . Right sided temporal headache 12/21/2016  . Trochanteric bursitis, right hip 10/26/2016  . Pruritus 10/26/2016  . Foot sprain, left, initial encounter 08/31/2016  . Pain of right side of body 08/31/2016  . Headache 04/24/2016  . LLQ pain 04/24/2016  . Glossitis 02/02/2016  . Routine general medical examination at a health care facility 10/27/2015  . Facial pain 10/06/2015  . Trigeminal neuralgia of right side of face 11/30/2010  . Morbid obesity ([place]) 11/09/2010  . Fibromyalgia 11/09/2010  . HEMATURIA, HX OF 11/14/2009  . LOC OSTEOARTHROS NOT SPEC PRIM/SEC OTH [facility] SITE 06/27/2009  . Allergic rhinitis 08/26/2008  . 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

## 2020-07-22 ENCOUNTER — Other Ambulatory Visit: Payer: Self-pay

## 2020-07-22 ENCOUNTER — Ambulatory Visit (INDEPENDENT_AMBULATORY_CARE_PROVIDER_SITE_OTHER): Payer: Federal, State, Local not specified - PPO | Admitting: Rehabilitative and Restorative Service Providers"

## 2020-07-22 ENCOUNTER — Encounter: Payer: Self-pay | Admitting: Rehabilitative and Restorative Service Providers"

## 2020-07-22 DIAGNOSIS — R262 Difficulty in walking, not elsewhere classified: Secondary | ICD-10-CM

## 2020-07-22 DIAGNOSIS — R600 Localized edema: Secondary | ICD-10-CM

## 2020-07-22 DIAGNOSIS — M25562 Pain in left knee: Secondary | ICD-10-CM | POA: Diagnosis not present

## 2020-07-22 DIAGNOSIS — M25662 Stiffness of left knee, not elsewhere classified: Secondary | ICD-10-CM | POA: Diagnosis not present

## 2020-07-22 DIAGNOSIS — M6281 Muscle weakness (generalized): Secondary | ICD-10-CM

## 2020-07-22 DIAGNOSIS — G8929 Other chronic pain: Secondary | ICD-10-CM

## 2020-07-22 NOTE — Therapy (Signed)
[facility] [address] [place], [place], [address] Phone: [phone]   Fax:  [phone]  Physical Therapy Treatment  Patient Details  Name: Summer [name] MRN: [id number] Date of Birth: 29-Jul-1951 Referring Provider (PT): Dr. Erlinda Hong   Encounter Date: 07/22/2020   PT End of Session - 07/22/20 1023    Visit Number 4    Number of Visits 16    Date for PT Re-Evaluation 09/19/20    Authorization Type [place], Medicare    Authorization - Visit Number 4    Progress Note Due on Visit 10    PT Start Time 0930    PT Stop Time 1013    PT Time Calculation (min) 43 min    Activity Tolerance Patient tolerated treatment well    Behavior During Therapy [facility] for tasks assessed/performed           Past Medical History:  Diagnosis Date  . Abdominal pain, left lower quadrant 11/09/2009   Qualifier: Diagnosis of  By: Valma Cava LPN, Izora Gala    . Allergic rhinitis 08/26/2008   Qualifier: Diagnosis of  By: Sherren Mocha MD, Jory Ee   . Anemia, iron deficiency 11/21/2018  . Arthritis shoulders and back  . BACK PAIN, CHRONIC 06/13/2007   Qualifier: Diagnosis of  By: Sherren Mocha MD, Jory Ee   . 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 Complication of anesthesia    Pt states having some "difficulty waking up"  . Constipation 07/01/2018  . Costochondritis 05/04/2014  . Diverticulitis of colon 06/13/2007   S/p segmental colectomy for perforated diverticulitis   . Diverticulosis   . Facial pain 10/06/2015  . Fibromyalgia   . Foot sprain, left, initial encounter 08/31/2016  . Glossitis 02/02/2016  . Headache 04/24/2016  . HEMATURIA UNSPECIFIED 06/25/2008   Qualifier: Diagnosis of  By: Sherren Mocha MD, Jory Ee   . HEMATURIA, HX OF 11/14/2009   Qualifier: Diagnosis of  By: Regino Schultze CMA (AAMA), Apolonio Schneiders    . History of kidney stones   . History of kidney stones   . History of shingles 2012--  no residual pain  . Itching 10/13/2018    . Left eye pain 07/01/2018  . Left ureteral calculus   . LLQ pain 04/24/2016  . LOC OSTEOARTHROS NOT SPEC PRIM/SEC OTH [facility] SITE 06/27/2009   Qualifier: Diagnosis of  By: Sherren Mocha MD, Jory Ee   . Malignant neoplasm of upper-outer quadrant of right breast in female, estrogen receptor positive ([place]) 11/17/2018  . MCI (mild cognitive impairment)   . Memory loss 12/21/2016  . Morbid obesity ([place]) 11/09/2010  . Pain of right side of body 08/31/2016  . Personal history of chemotherapy   . Personal history of radiation therapy   . Pruritus 10/26/2016  . Renal disorder   . Right sided temporal headache 12/21/2016  . Tremor 12/21/2016  . Trigeminal neuralgia RIGHT  . Trigeminal neuralgia of right side of face 11/30/2010  . Trochanteric bursitis, right hip 10/26/2016  . Urgency of urination   . Weight loss 07/22/2017  . Zoster without complications 02/21/4314    Past Surgical History:  Procedure Laterality Date  . BREAST BIOPSY Right 11/11/2018  . BREAST LUMPECTOMY Right 11/28/2018  . BREAST LUMPECTOMY WITH RADIOACTIVE SEED AND SENTINEL LYMPH NODE BIOPSY Right 11/28/2018   Procedure: RIGHT BREAST LUMPECTOMY WITH RADIOACTIVE SEED AND RIGHT AXILLARY SENTINEL LYMPH NODE BIOPSY;  Surgeon: Excell Seltzer, MD;  Location: [place];  Service: General;  Laterality:  Right;  . CEREBRAL MICROVASCULAR DECOMPRESSION  07-05-2006   RIGHT TRIGEMINAL NERVE  . CYSTOSCOPY/RETROGRADE/URETEROSCOPY/STONE EXTRACTION WITH BASKET  07/04/2012   Procedure: CYSTOSCOPY/RETROGRADE/URETEROSCOPY/STONE EXTRACTION WITH BASKET;  Surgeon: Claybon Jabs, MD;  Location: [facility];  Service: Urology;  Laterality: Left;  . EXPLORATORY LAPAROTOMY/ RESECTION MID TO DISTAL SIGMOID AND PROXIMAL RECTUM/ END PROXIMAL SIGMOID COLOSTOMY  07-17-2006   PERFORATED DIVERTICULITIS WITH PERITONITIS  . gamma knife  05/17/2016   for trigeminal neuralgia, [facility], Dr Salomon Fick  . gamma knife  2019  . IR CV LINE INJECTION  10/27/2019  .  PORTACATH PLACEMENT Left 11/28/2018   Procedure: INSERTION PORT-A-CATH WITH ULTRASOUND;  Surgeon: Excell Seltzer, MD;  Location: [place];  Service: General;  Laterality: Left;  . portacath removal    . RESECTION COLOSTOMY/ CLOSURE COLOSTOMY WITH COLOPROCTOSTOMY  02-20-2007  . TONSILLECTOMY  AS CHILD  . TOTAL KNEE ARTHROPLASTY Left 06/20/2020   Procedure: LEFT TOTAL KNEE ARTHROPLASTY;  Surgeon: Leandrew Koyanagi, MD;  Location: [place];  Service: Orthopedics;  Laterality: Left;  . URETEROSCOPY  07/04/2012   Procedure: URETEROSCOPY;  Surgeon: Claybon Jabs, MD;  Location: [facility];  Service: Urology;  Laterality: Left;  Marland Kitchen VAGINAL HYSTERECTOMY  1998   Partial  . WISDOM TOOTH EXTRACTION      There were no vitals filed for this visit.   Subjective Assessment - 07/22/20 1018    Subjective [place] notes sleeping remains difficult.    Limitations Sitting;Lifting;Standing;Walking;House hold activities    Patient Stated Goals Get back to walking, gardening, cleaning house, walking independent    Currently in Pain? Yes    Pain Score 4     Pain Location Knee    Pain Orientation Left    Pain Descriptors / Indicators Aching;Sore;Tightness    Pain Type Surgical pain;Chronic pain    Pain Onset More than a month ago    Pain Frequency Intermittent    Aggravating Factors  Prolonged postures, inactivity and too much WB    Pain Relieving Factors Exercises    Effect of Pain on Daily Activities Sleep is affected and needs a cane to walk outside the home              [facility] PT Assessment - 07/22/20 0001      AROM   Left Knee Extension -7    Left Knee Flexion 102                         [facility] Adult PT Treatment/Exercise - 07/22/20 0001      Exercises   Exercises Knee/Hip      Knee/Hip Exercises: Stretches   Other Knee/Hip Stretches Tailgate knee flexion AROM 3 minutes X 2      Knee/Hip Exercises: Aerobic   Recumbent Bike Seat 8 for 4 minutes & Seat 6 for 4  minutes      Knee/Hip Exercises: Seated   Other Seated Knee/Hip Exercises Seated knee extension stretch with self-overpressure 3 minutes      Knee/Hip Exercises: Supine   Quad Sets Strengthening;Both;3 sets;10 reps   5 seconds     Manual Therapy   Manual Therapy Joint mobilization    Manual therapy comments Overpressure into flexion and extension                  PT Education - 07/22/20 1020    Education Details Reviewed HEP.  Emphasized extension AROM at home.    Person(s) Educated Patient  Methods Explanation;Demonstration;Tactile cues;Verbal cues    Comprehension Verbalized understanding;Returned demonstration;Verbal cues required;Tactile cues required;Need further instruction            PT Short Term Goals - 07/22/20 1021      PT SHORT TERM GOAL #1   Title Patient will demonstrate independent use of home exercise program to maintain progress from in clinic treatments.    Time 3    Period Weeks    Status On-going    Target Date 08/01/20             PT Long Term Goals - 07/22/20 1022      PT LONG TERM GOAL #1   Title Patient will demonstrate/report pain at worst less than or equal to 2/10 to facilitate minimal limitation in daily activity secondary to pain symptoms.    Time 10    Period Weeks    Status On-going      PT LONG TERM GOAL #2   Title Patient will demonstrate independent use of home exercise program to facilitate ability to maintain/progress functional gains from skilled physical therapy services.    Time 10    Period Weeks    Status On-going      PT LONG TERM GOAL #3   Title Pt. will demonstrate Lt knee AROM 0-110 deg to facilitate ability to perform walking, stairs, squat, transfers at PLOF s limitation.    Time 10    Period Weeks    Status On-going      PT LONG TERM GOAL #4   Title Pt. will demonstrate Lt LE MMT 5/5 throughout to facilitate reciprocal gait on stairs, independent ambulation.    Time 10    Period Weeks    Status  On-going      PT LONG TERM GOAL #5   Title Pt. will demonstrate Lt SLS > 15 seconds to facilitate stability on uneven surfaces ambulation independent.    Time 10    Period Weeks    Status On-going      PT LONG TERM GOAL #6   Title Pt. will demonstrate independent ambulation at PLOF community distances > 300 ft.    Time 10    Period Weeks    Status On-going                 Plan - 07/22/20 1023    Clinical Impression Statement Summer [name] is making objective progress with her L knee flexion.  Extension AROM has improved from evaluation but is unchanged since last week.  I encouraged her to increase compliance with quadriceps sets and knee extension stretches to improve extension AROM, improve gait quality and meet long-term goals.    Personal Factors and Comorbidities Comorbidity 2    Comorbidities Fibromyalgia, obesity    Examination-Activity Limitations Bathing;Sit;Sleep;Bed Mobility;Bend;Squat;Stairs;Carry;Stand;Transfers;Lift;Locomotion Level    Examination-Participation Restrictions Yard Work;Cleaning;Community Activity    Stability/Clinical Decision Making Stable/Uncomplicated    Rehab Potential Good    PT Frequency Other (comment)   2x-3x/week   PT Duration --   10 weeks   PT Treatment/Interventions ADLs/Self Care Home Management;Cryotherapy;Electrical Stimulation;Iontophoresis 4mg /ml Dexamethasone;Moist Heat;Balance training;Therapeutic exercise;Therapeutic activities;Functional mobility training;Stair training;Gait training;Ultrasound;Neuromuscular re-education;Patient/family education;Manual techniques;Vasopneumatic Device;Taping;Dry needling;Passive range of motion;Joint Manipulations    PT Next Visit Plan Progressive mobility, non compliant surface balance, WB strengthening.    PT Home Exercise Plan [id number]    Consulted and Agree with Plan of Care Patient           Patient will benefit from skilled therapeutic intervention in order  to improve the following deficits  and impairments:  Abnormal gait, Decreased endurance, Hypomobility, Increased edema, Decreased activity tolerance, Decreased strength, Pain, Increased muscle spasms, Difficulty walking, Decreased mobility, Decreased balance, Decreased range of motion, Improper body mechanics, Impaired perceived functional ability, Impaired flexibility, Decreased coordination  Visit Diagnosis: Difficulty in walking, not elsewhere classified  Stiffness of left knee, not elsewhere classified  Chronic pain of left knee  Muscle weakness (generalized)  Localized edema     Problem List Patient Active Problem List   Diagnosis Date Noted  . Status post total left knee replacement 06/20/2020  . Primary osteoarthritis of left knee 06/19/2020  . Left knee pain 02/26/2020  . Bilateral arm numbness and tingling while sleeping 02/26/2020  . COVID-19 11/06/2019  . Muscle cramps 05/12/2019  . Cramps, muscle, general 04/23/2019  . Tinnitus 04/16/2019  . Chest pain of uncertain etiology 16/38/4665  . Bilateral leg edema 02/10/2019  . Port-A-Cath in place 01/30/2019  . Anemia, iron deficiency 11/21/2018  . Malignant neoplasm of upper-outer quadrant of right breast in female, estrogen receptor positive ([place]) 11/17/2018  . Itching 10/13/2018  . Left eye pain 07/01/2018  . Constipation 07/01/2018  . Weight loss 07/22/2017  . Memory loss 12/21/2016  . Tremor 12/21/2016  . Right sided temporal headache 12/21/2016  . Trochanteric bursitis, right hip 10/26/2016  . Pruritus 10/26/2016  . Foot sprain, left, initial encounter 08/31/2016  . Pain of right side of body 08/31/2016  . Headache 04/24/2016  . LLQ pain 04/24/2016  . Glossitis 02/02/2016  . Routine general medical examination at a health care facility 10/27/2015  . Facial pain 10/06/2015  . Trigeminal neuralgia of right side of face 11/30/2010  . Morbid obesity ([place]) 11/09/2010  . Fibromyalgia 11/09/2010  . HEMATURIA, HX OF 11/14/2009  . LOC  OSTEOARTHROS NOT SPEC PRIM/SEC OTH [facility] SITE 06/27/2009  . Allergic rhinitis 08/26/2008  . VULVA INTRAEPITHELIAL NEOPLASIA, 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    Farley Ly PT, MPT 07/22/2020, 10:27 AM  [facility] [address] [place], [place], [address] Phone: [phone]   Fax:  [phone]  Name: Summer [name] MRN: [id number] Date of Birth: October 11, 1950

## 2020-07-26 ENCOUNTER — Other Ambulatory Visit: Payer: Self-pay

## 2020-07-26 ENCOUNTER — Ambulatory Visit (INDEPENDENT_AMBULATORY_CARE_PROVIDER_SITE_OTHER): Payer: Federal, State, Local not specified - PPO | Admitting: Rehabilitative and Restorative Service Providers"

## 2020-07-26 ENCOUNTER — Encounter: Payer: Self-pay | Admitting: Rehabilitative and Restorative Service Providers"

## 2020-07-26 DIAGNOSIS — M6281 Muscle weakness (generalized): Secondary | ICD-10-CM | POA: Diagnosis not present

## 2020-07-26 DIAGNOSIS — M25562 Pain in left knee: Secondary | ICD-10-CM

## 2020-07-26 DIAGNOSIS — R600 Localized edema: Secondary | ICD-10-CM

## 2020-07-26 DIAGNOSIS — R262 Difficulty in walking, not elsewhere classified: Secondary | ICD-10-CM | POA: Diagnosis not present

## 2020-07-26 DIAGNOSIS — M25662 Stiffness of left knee, not elsewhere classified: Secondary | ICD-10-CM

## 2020-07-26 DIAGNOSIS — G8929 Other chronic pain: Secondary | ICD-10-CM

## 2020-07-26 NOTE — Telephone Encounter (Signed)
Yes thanks 

## 2020-07-26 NOTE — Therapy (Addendum)
[facility] [address] [place], [place], [address] Phone: [phone]   Fax:  [phone]  Physical Therapy Treatment  Patient Details  Name: Summer [name] MRN: [id number] Date of Birth: 12/21/1950 Referring Provider (PT): Dr. Erlinda Hong   Encounter Date: 07/26/2020   PT End of Session - 07/26/20 1301    Visit Number 5    Number of Visits 16    Date for PT Re-Evaluation 09/19/20    Authorization Type [employer], Medicare    Authorization - Visit Number 5    Progress Note Due on Visit 10    PT Start Time 1300    PT Stop Time 1350    PT Time Calculation (min) 50 min    Activity Tolerance Patient tolerated treatment well    Behavior During Therapy [facility] for tasks assessed/performed           Past Medical History:  Diagnosis Date  . Abdominal pain, left lower quadrant 11/09/2009   Qualifier: Diagnosis of  By: Valma Cava LPN, Izora Gala    . Allergic rhinitis 08/26/2008   Qualifier: Diagnosis of  By: Sherren Mocha MD, Jory Ee   . Anemia, iron deficiency 11/21/2018  . Arthritis shoulders and back  . BACK PAIN, CHRONIC 06/13/2007   Qualifier: Diagnosis of  By: Sherren Mocha MD, Jory Ee   . 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 Complication of anesthesia    Pt states having some "difficulty waking up"  . Constipation 07/01/2018  . Costochondritis 05/04/2014  . Diverticulitis of colon 06/13/2007   S/p segmental colectomy for perforated diverticulitis   . Diverticulosis   . Facial pain 10/06/2015  . Fibromyalgia   . Foot sprain, left, initial encounter 08/31/2016  . Glossitis 02/02/2016  . Headache 04/24/2016  . HEMATURIA UNSPECIFIED 06/25/2008   Qualifier: Diagnosis of  By: Sherren Mocha MD, Jory Ee   . HEMATURIA, HX OF 11/14/2009   Qualifier: Diagnosis of  By: Regino Schultze CMA (AAMA), Apolonio Schneiders    . History of kidney stones   . History of kidney stones   . History of shingles 2012--  no residual pain  . Itching 10/13/2018    . Left eye pain 07/01/2018  . Left ureteral calculus   . LLQ pain 04/24/2016  . LOC OSTEOARTHROS NOT SPEC PRIM/SEC OTH [facility] SITE 06/27/2009   Qualifier: Diagnosis of  By: Sherren Mocha MD, Jory Ee   . Malignant neoplasm of upper-outer quadrant of right breast in female, estrogen receptor positive ([place]) 11/17/2018  . MCI (mild cognitive impairment)   . Memory loss 12/21/2016  . Morbid obesity ([place]) 11/09/2010  . Pain of right side of body 08/31/2016  . Personal history of chemotherapy   . Personal history of radiation therapy   . Pruritus 10/26/2016  . Renal disorder   . Right sided temporal headache 12/21/2016  . Tremor 12/21/2016  . Trigeminal neuralgia RIGHT  . Trigeminal neuralgia of right side of face 11/30/2010  . Trochanteric bursitis, right hip 10/26/2016  . Urgency of urination   . Weight loss 07/22/2017  . Zoster without complications 12/17/1446    Past Surgical History:  Procedure Laterality Date  . BREAST BIOPSY Right 11/11/2018  . BREAST LUMPECTOMY Right 11/28/2018  . BREAST LUMPECTOMY WITH RADIOACTIVE SEED AND SENTINEL LYMPH NODE BIOPSY Right 11/28/2018   Procedure: RIGHT BREAST LUMPECTOMY WITH RADIOACTIVE SEED AND RIGHT AXILLARY SENTINEL LYMPH NODE BIOPSY;  Surgeon: Excell Seltzer, MD;  Location: [place];  Service: General;  Laterality:  Right;  . CEREBRAL MICROVASCULAR DECOMPRESSION  07-05-2006   RIGHT TRIGEMINAL NERVE  . CYSTOSCOPY/RETROGRADE/URETEROSCOPY/STONE EXTRACTION WITH BASKET  07/04/2012   Procedure: CYSTOSCOPY/RETROGRADE/URETEROSCOPY/STONE EXTRACTION WITH BASKET;  Surgeon: Claybon Jabs, MD;  Location: [facility];  Service: Urology;  Laterality: Left;  . EXPLORATORY LAPAROTOMY/ RESECTION MID TO DISTAL SIGMOID AND PROXIMAL RECTUM/ END PROXIMAL SIGMOID COLOSTOMY  07-17-2006   PERFORATED DIVERTICULITIS WITH PERITONITIS  . gamma knife  05/17/2016   for trigeminal neuralgia, [facility], Dr Salomon Fick  . gamma knife  2019  . IR CV LINE INJECTION  10/27/2019  .  PORTACATH PLACEMENT Left 11/28/2018   Procedure: INSERTION PORT-A-CATH WITH ULTRASOUND;  Surgeon: Excell Seltzer, MD;  Location: [place];  Service: General;  Laterality: Left;  . portacath removal    . RESECTION COLOSTOMY/ CLOSURE COLOSTOMY WITH COLOPROCTOSTOMY  02-20-2007  . TONSILLECTOMY  AS CHILD  . TOTAL KNEE ARTHROPLASTY Left 06/20/2020   Procedure: LEFT TOTAL KNEE ARTHROPLASTY;  Surgeon: Leandrew Koyanagi, MD;  Location: [place];  Service: Orthopedics;  Laterality: Left;  . URETEROSCOPY  07/04/2012   Procedure: URETEROSCOPY;  Surgeon: Claybon Jabs, MD;  Location: [facility];  Service: Urology;  Laterality: Left;  Marland Kitchen VAGINAL HYSTERECTOMY  1998   Partial  . WISDOM TOOTH EXTRACTION      There were no vitals filed for this visit.                 07/28/20 0948  Symptoms/Limitations  Subjective Pt. reported stiffness upon arrival.  No pain specific.  Limitations Sitting;Lifting;Standing;Walking;House hold activities  Patient Stated Goals Get back to walking, gardening, cleaning house, walking independent  Pain Assessment  Currently in Pain? No/denies  Pain Onset More than a month ago          [facility] Adult PT Treatment/Exercise - 07/26/20 0001      Neuro Re-ed    Neuro Re-ed Details  tandem ambulation fwd 15 ft x 6, retro step 20x Lt LE       Knee/Hip Exercises: Stretches   Gastroc Stretch 3 reps;30 seconds   incline board   Other Knee/Hip Stretches TKE stretch c cues/instruction, performed 5 mins in supine on towel roll      Knee/Hip Exercises: Machines for Strengthening   Cybex Knee Extension Eccentric Lt LE 10 lbs 2 x 10      Knee/Hip Exercises: Standing   Lateral Step Up Step Height: 4";2 sets;10 reps;Both   occasional HHA, reduced control noted on Lt LE lowering     Modalities   Modalities Vasopneumatic      Vasopneumatic   Number Minutes Vasopneumatic  10 minutes    Vasopnuematic Location  Knee    Vasopneumatic Pressure Medium     Vasopneumatic Temperature  34      Manual Therapy   Manual therapy comments seated Lt knee flexion, distraction, IR MWM                    PT Short Term Goals - 07/22/20 1021      PT SHORT TERM GOAL #1   Title Patient will demonstrate independent use of home exercise program to maintain progress from in clinic treatments.    Time 3    Period Weeks    Status On-going    Target Date 08/01/20             PT Long Term Goals - 07/22/20 1022      PT LONG TERM GOAL #1  Title Patient will demonstrate/report pain at worst less than or equal to 2/10 to facilitate minimal limitation in daily activity secondary to pain symptoms.    Time 10    Period Weeks    Status On-going      PT LONG TERM GOAL #2   Title Patient will demonstrate independent use of home exercise program to facilitate ability to maintain/progress functional gains from skilled physical therapy services.    Time 10    Period Weeks    Status On-going      PT LONG TERM GOAL #3   Title Pt. will demonstrate Lt knee AROM 0-110 deg to facilitate ability to perform walking, stairs, squat, transfers at PLOF s limitation.    Time 10    Period Weeks    Status On-going      PT LONG TERM GOAL #4   Title Pt. will demonstrate Lt LE MMT 5/5 throughout to facilitate reciprocal gait on stairs, independent ambulation.    Time 10    Period Weeks    Status On-going      PT LONG TERM GOAL #5   Title Pt. will demonstrate Lt SLS > 15 seconds to facilitate stability on uneven surfaces ambulation independent.    Time 10    Period Weeks    Status On-going      PT LONG TERM GOAL #6   Title Pt. will demonstrate independent ambulation at PLOF community distances > 300 ft.    Time 10    Period Weeks    Status On-going                 Plan - 07/26/20 1333    Clinical Impression Statement Periodic ambulation time in clinic independent.  Flexion mobility continued to show improvement in quality at this time c  minimal complaints in mid range, mild at end range.  Extension mobility most limited and stiff at this time.  Review TKE stretching for home use and long duration focus. Fair performance on balance intervention non compliant surface.    Personal Factors and Comorbidities Comorbidity 2    Comorbidities Fibromyalgia, obesity    Examination-Activity Limitations Bathing;Sit;Sleep;Bed Mobility;Bend;Squat;Stairs;Carry;Stand;Transfers;Lift;Locomotion Level    Examination-Participation Restrictions Yard Work;Cleaning;Community Activity    Stability/Clinical Decision Making Stable/Uncomplicated    Rehab Potential Good    PT Frequency Other (comment)   2x-3x/week   PT Duration --   10 weeks   PT Treatment/Interventions ADLs/Self Care Home Management;Cryotherapy;Electrical Stimulation;Iontophoresis 4mg /ml Dexamethasone;Moist Heat;Balance training;Therapeutic exercise;Therapeutic activities;Functional mobility training;Stair training;Gait training;Ultrasound;Neuromuscular re-education;Patient/family education;Manual techniques;Vasopneumatic Device;Taping;Dry needling;Passive range of motion;Joint Manipulations    PT Next Visit Plan Extension end range, WB strengthening, balance intervention progression.    PT Home Exercise Plan [id number]    Consulted and Agree with Plan of Care Patient           Patient will benefit from skilled therapeutic intervention in order to improve the following deficits and impairments:  Abnormal gait, Decreased endurance, Hypomobility, Increased edema, Decreased activity tolerance, Decreased strength, Pain, Increased muscle spasms, Difficulty walking, Decreased mobility, Decreased balance, Decreased range of motion, Improper body mechanics, Impaired perceived functional ability, Impaired flexibility, Decreased coordination  Visit Diagnosis: Chronic pain of left knee  Muscle weakness (generalized)  Stiffness of left knee, not elsewhere classified  Difficulty in walking, not  elsewhere classified  Localized edema     Problem List Patient Active Problem List   Diagnosis Date Noted  . Status post total left knee replacement 06/20/2020  . Primary osteoarthritis of left knee  06/19/2020  . Left knee pain 02/26/2020  . Bilateral arm numbness and tingling while sleeping 02/26/2020  . COVID-19 11/06/2019  . Muscle cramps 05/12/2019  . Cramps, muscle, general 04/23/2019  . Tinnitus 04/16/2019  . Chest pain of uncertain etiology 36/62/9476  . Bilateral leg edema 02/10/2019  . Port-A-Cath in place 01/30/2019  . Anemia, iron deficiency 11/21/2018  . Malignant neoplasm of upper-outer quadrant of right breast in female, estrogen receptor positive ([place]) 11/17/2018  . Itching 10/13/2018  . Left eye pain 07/01/2018  . Constipation 07/01/2018  . Weight loss 07/22/2017  . Memory loss 12/21/2016  . Tremor 12/21/2016  . Right sided temporal headache 12/21/2016  . Trochanteric bursitis, right hip 10/26/2016  . Pruritus 10/26/2016  . Foot sprain, left, initial encounter 08/31/2016  . Pain of right side of body 08/31/2016  . Headache 04/24/2016  . LLQ pain 04/24/2016  . Glossitis 02/02/2016  . Routine general medical examination at a health care facility 10/27/2015  . Facial pain 10/06/2015  . Trigeminal neuralgia of right side of face 11/30/2010  . Morbid obesity ([place]) 11/09/2010  . Fibromyalgia 11/09/2010  . HEMATURIA, HX OF 11/14/2009  . LOC OSTEOARTHROS NOT SPEC PRIM/SEC OTH [facility] SITE 06/27/2009  . Allergic rhinitis 08/26/2008  . 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

## 2020-07-28 ENCOUNTER — Ambulatory Visit (INDEPENDENT_AMBULATORY_CARE_PROVIDER_SITE_OTHER): Payer: Federal, State, Local not specified - PPO | Admitting: Rehabilitative and Restorative Service Providers"

## 2020-07-28 ENCOUNTER — Other Ambulatory Visit: Payer: Self-pay

## 2020-07-28 ENCOUNTER — Encounter: Payer: Self-pay | Admitting: Rehabilitative and Restorative Service Providers"

## 2020-07-28 DIAGNOSIS — R262 Difficulty in walking, not elsewhere classified: Secondary | ICD-10-CM | POA: Diagnosis not present

## 2020-07-28 DIAGNOSIS — G8929 Other chronic pain: Secondary | ICD-10-CM

## 2020-07-28 DIAGNOSIS — M6281 Muscle weakness (generalized): Secondary | ICD-10-CM | POA: Diagnosis not present

## 2020-07-28 DIAGNOSIS — R600 Localized edema: Secondary | ICD-10-CM

## 2020-07-28 DIAGNOSIS — M25662 Stiffness of left knee, not elsewhere classified: Secondary | ICD-10-CM | POA: Diagnosis not present

## 2020-07-28 DIAGNOSIS — M25562 Pain in left knee: Secondary | ICD-10-CM | POA: Diagnosis not present

## 2020-07-28 NOTE — Therapy (Signed)
[facility] [address] [place], [place], [address] Phone: [phone]   Fax:  [phone]  Physical Therapy Treatment  Patient Details  Name: Summer [name] MRN: [id number] Date of Birth: Oct 17, 1950 Referring Provider (PT): Dr. Erlinda Hong   Encounter Date: 07/28/2020   PT End of Session - 07/28/20 0951    Visit Number 6    Number of Visits 16    Date for PT Re-Evaluation 09/19/20    Authorization Type [place], Medicare    Authorization - Visit Number 6    Progress Note Due on Visit 10    PT Start Time 0930    PT Stop Time 1010    PT Time Calculation (min) 40 min    Activity Tolerance Patient tolerated treatment well    Behavior During Therapy [facility] for tasks assessed/performed           Past Medical History:  Diagnosis Date   Abdominal pain, left lower quadrant 11/09/2009   Qualifier: Diagnosis of  By: Valma Cava LPN, Nancy     Allergic rhinitis 08/26/2008   Qualifier: Diagnosis of  By: Sherren Mocha MD, Dellis Filbert A    Anemia, iron deficiency 11/21/2018   Arthritis shoulders and back   BACK PAIN, CHRONIC 06/13/2007   Qualifier: Diagnosis of  By: Sherren Mocha MD, Dellis Filbert A    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   Complication of anesthesia    Pt states having some "difficulty waking up"   Constipation 07/01/2018   Costochondritis 05/04/2014   Diverticulitis of colon 06/13/2007   S/p segmental colectomy for perforated diverticulitis    Diverticulosis    Facial pain 10/06/2015   Fibromyalgia    Foot sprain, left, initial encounter 08/31/2016   Glossitis 02/02/2016   Headache 04/24/2016   HEMATURIA UNSPECIFIED 06/25/2008   Qualifier: Diagnosis of  By: Sherren Mocha MD, Jory Ee    HEMATURIA, HX OF 11/14/2009   Qualifier: Diagnosis of  By: Regino Schultze CMA (AAMA), Rachel     History of kidney stones    History of kidney stones    History of shingles 2012--  no residual pain   Itching 10/13/2018     Left eye pain 07/01/2018   Left ureteral calculus    LLQ pain 04/24/2016   LOC OSTEOARTHROS NOT SPEC PRIM/SEC OTH [facility] SITE 06/27/2009   Qualifier: Diagnosis of  By: Sherren Mocha MD, Dellis Filbert A    Malignant neoplasm of upper-outer quadrant of right breast in female, estrogen receptor positive ([place]) 11/17/2018   MCI (mild cognitive impairment)    Memory loss 12/21/2016   Morbid obesity ([place]) 11/09/2010   Pain of right side of body 08/31/2016   Personal history of chemotherapy    Personal history of radiation therapy    Pruritus 10/26/2016   Renal disorder    Right sided temporal headache 12/21/2016   Tremor 12/21/2016   Trigeminal neuralgia RIGHT   Trigeminal neuralgia of right side of face 11/30/2010   Trochanteric bursitis, right hip 10/26/2016   Urgency of urination    Weight loss 07/22/2017   Zoster without complications 8/52/7782    Past Surgical History:  Procedure Laterality Date   BREAST BIOPSY Right 11/11/2018   BREAST LUMPECTOMY Right 11/28/2018   BREAST LUMPECTOMY WITH RADIOACTIVE SEED AND SENTINEL LYMPH NODE BIOPSY Right 11/28/2018   Procedure: RIGHT BREAST LUMPECTOMY WITH RADIOACTIVE SEED AND RIGHT AXILLARY SENTINEL LYMPH NODE BIOPSY;  Surgeon: Excell Seltzer, MD;  Location: [place];  Service: General;  Laterality:  Right;   CEREBRAL MICROVASCULAR DECOMPRESSION  07-05-2006   RIGHT TRIGEMINAL NERVE   CYSTOSCOPY/RETROGRADE/URETEROSCOPY/STONE EXTRACTION WITH BASKET  07/04/2012   Procedure: CYSTOSCOPY/RETROGRADE/URETEROSCOPY/STONE EXTRACTION WITH BASKET;  Surgeon: Claybon Jabs, MD;  Location: [facility];  Service: Urology;  Laterality: Left;   EXPLORATORY LAPAROTOMY/ RESECTION MID TO DISTAL SIGMOID AND PROXIMAL RECTUM/ END PROXIMAL SIGMOID COLOSTOMY  07-17-2006   PERFORATED DIVERTICULITIS WITH PERITONITIS   gamma knife  05/17/2016   for trigeminal neuralgia, [facility], Dr Salomon Fick   gamma knife  2019   IR CV LINE INJECTION  10/27/2019    PORTACATH PLACEMENT Left 11/28/2018   Procedure: INSERTION PORT-A-CATH WITH ULTRASOUND;  Surgeon: Excell Seltzer, MD;  Location: [place];  Service: General;  Laterality: Left;   portacath removal     RESECTION COLOSTOMY/ CLOSURE COLOSTOMY WITH COLOPROCTOSTOMY  02-20-2007   TONSILLECTOMY  AS CHILD   TOTAL KNEE ARTHROPLASTY Left 06/20/2020   Procedure: LEFT TOTAL KNEE ARTHROPLASTY;  Surgeon: Leandrew Koyanagi, MD;  Location: [place];  Service: Orthopedics;  Laterality: Left;   URETEROSCOPY  07/04/2012   Procedure: URETEROSCOPY;  Surgeon: Claybon Jabs, MD;  Location[facility];  Service: Urology;  Laterality: Left;   VAGINAL HYSTERECTOMY  1998   Partial   WISDOM TOOTH EXTRACTION      There were no vitals filed for this visit.   Subjective Assessment - 07/28/20 0950    Subjective Pt. indicated stiffness continued when she goes home.  Does the exercises and feels stiffness return.  Overall feeling some better with walking.    Limitations Sitting;Lifting;Standing;Walking;House hold activities    Patient Stated Goals Get back to walking, gardening, cleaning house, walking independent    Currently in Pain? Yes    Pain Score --   mild stiffness   Pain Location Knee    Pain Orientation Left    Pain Descriptors / Indicators Tightness    Pain Type Chronic pain;Surgical pain    Pain Onset More than a month ago    Aggravating Factors  insidious stiffness    Pain Relieving Factors exercise helps stiffness                             [facility] Adult PT Treatment/Exercise - 07/28/20 0001      Neuro Re-ed    Neuro Re-ed Details  tandem ambulation fwd/rev 10 ft x 1 each, on foam 8 ft x 4 each way, SLS 1 min attempts c occasional HHA x 2 bilateral      Knee/Hip Exercises: Aerobic   Recumbent Bike seat 6 9 mins      Knee/Hip Exercises: Machines for Strengthening   Cybex Knee Extension Eccentric Lt LE 3 x 10 10 lbs    Total Gym Leg Press SL 3 x 10 50 lbs,  performed bilateral      Knee/Hip Exercises: Standing   Forward Step Up 2 sets;10 reps;Both;Step Height: 6";Hand Hold: 0                    PT Short Term Goals - 07/22/20 1021      PT SHORT TERM GOAL #1   Title Patient will demonstrate independent use of home exercise program to maintain progress from in clinic treatments.    Time 3    Period Weeks    Status On-going    Target Date 08/01/20             PT  Long Term Goals - 07/22/20 1022      PT LONG TERM GOAL #1   Title Patient will demonstrate/report pain at worst less than or equal to 2/10 to facilitate minimal limitation in daily activity secondary to pain symptoms.    Time 10    Period Weeks    Status On-going      PT LONG TERM GOAL #2   Title Patient will demonstrate independent use of home exercise program to facilitate ability to maintain/progress functional gains from skilled physical therapy services.    Time 10    Period Weeks    Status On-going      PT LONG TERM GOAL #3   Title Pt. will demonstrate Lt knee AROM 0-110 deg to facilitate ability to perform walking, stairs, squat, transfers at PLOF s limitation.    Time 10    Period Weeks    Status On-going      PT LONG TERM GOAL #4   Title Pt. will demonstrate Lt LE MMT 5/5 throughout to facilitate reciprocal gait on stairs, independent ambulation.    Time 10    Period Weeks    Status On-going      PT LONG TERM GOAL #5   Title Pt. will demonstrate Lt SLS > 15 seconds to facilitate stability on uneven surfaces ambulation independent.    Time 10    Period Weeks    Status On-going      PT LONG TERM GOAL #6   Title Pt. will demonstrate independent ambulation at PLOF community distances > 300 ft.    Time 10    Period Weeks    Status On-going                 Plan - 07/28/20 0958    Clinical Impression Statement Summer [name] demonstrated improved non compliant surface balance control today, allowing for progresion to compliant.  Pt.  continued to show steady progression in flexion mobility.  Extension end range still hypomobile.    Personal Factors and Comorbidities Comorbidity 2    Comorbidities Fibromyalgia, obesity    Examination-Activity Limitations Bathing;Sit;Sleep;Bed Mobility;Bend;Squat;Stairs;Carry;Stand;Transfers;Lift;Locomotion Level    Examination-Participation Restrictions Yard Work;Cleaning;Community Activity    Stability/Clinical Decision Making Stable/Uncomplicated    Rehab Potential Good    PT Frequency Other (comment)   2x-3x/week   PT Duration --   10 weeks   PT Treatment/Interventions ADLs/Self Care Home Management;Cryotherapy;Electrical Stimulation;Iontophoresis 4mg /ml Dexamethasone;Moist Heat;Balance training;Therapeutic exercise;Therapeutic activities;Functional mobility training;Stair training;Gait training;Ultrasound;Neuromuscular re-education;Patient/family education;Manual techniques;Vasopneumatic Device;Taping;Dry needling;Passive range of motion;Joint Manipulations    PT Next Visit Plan Extension end range, WB strengthening, balance intervention progression.    PT Home Exercise Plan [id number]    Consulted and Agree with Plan of Care Patient           Patient will benefit from skilled therapeutic intervention in order to improve the following deficits and impairments:  Abnormal gait, Decreased endurance, Hypomobility, Increased edema, Decreased activity tolerance, Decreased strength, Pain, Increased muscle spasms, Difficulty walking, Decreased mobility, Decreased balance, Decreased range of motion, Improper body mechanics, Impaired perceived functional ability, Impaired flexibility, Decreased coordination  Visit Diagnosis: Chronic pain of left knee  Muscle weakness (generalized)  Stiffness of left knee, not elsewhere classified  Difficulty in walking, not elsewhere classified  Localized edema     Problem List Patient Active Problem List   Diagnosis Date Noted   Status post total  left knee replacement 06/20/2020   Primary osteoarthritis of left knee 06/19/2020   Left knee pain 02/26/2020  Bilateral arm numbness and tingling while sleeping 02/26/2020   COVID-19 11/06/2019   Muscle cramps 05/12/2019   Cramps, muscle, general 04/23/2019   Tinnitus 04/16/2019   Chest pain of uncertain etiology 26/37/8588   Bilateral leg edema 02/10/2019   Port-A-Cath in place 01/30/2019   Anemia, iron deficiency 11/21/2018   Malignant neoplasm of upper-outer quadrant of right breast in female, estrogen receptor positive ([place]) 11/17/2018   Itching 10/13/2018   Left eye pain 07/01/2018   Constipation 07/01/2018   Weight loss 07/22/2017   Memory loss 12/21/2016   Tremor 12/21/2016   Right sided temporal headache 12/21/2016   Trochanteric bursitis, right hip 10/26/2016   Pruritus 10/26/2016   Foot sprain, left, initial encounter 08/31/2016   Pain of right side of body 08/31/2016   Headache 04/24/2016   LLQ pain 04/24/2016   Glossitis 02/02/2016   Routine general medical examination at a health care facility 10/27/2015   Facial pain 10/06/2015   Trigeminal neuralgia of right side of face 11/30/2010   Morbid obesity ([place]) 11/09/2010   Fibromyalgia 11/09/2010   HEMATURIA, HX OF 11/14/2009   LOC OSTEOARTHROS NOT SPEC PRIM/SEC OTH SPEC SITE 06/27/2009   Allergic rhinitis 08/26/2008   VULVA INTRAEPITHELIAL NEOPLASIA, 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

## 2020-08-02 ENCOUNTER — Other Ambulatory Visit: Payer: Self-pay

## 2020-08-02 ENCOUNTER — Ambulatory Visit (INDEPENDENT_AMBULATORY_CARE_PROVIDER_SITE_OTHER): Payer: Federal, State, Local not specified - PPO | Admitting: Orthopaedic Surgery

## 2020-08-02 ENCOUNTER — Encounter: Payer: Self-pay | Admitting: Physical Therapy

## 2020-08-02 ENCOUNTER — Ambulatory Visit (INDEPENDENT_AMBULATORY_CARE_PROVIDER_SITE_OTHER): Payer: Federal, State, Local not specified - PPO | Admitting: Physical Therapy

## 2020-08-02 ENCOUNTER — Ambulatory Visit (INDEPENDENT_AMBULATORY_CARE_PROVIDER_SITE_OTHER): Payer: Federal, State, Local not specified - PPO

## 2020-08-02 DIAGNOSIS — M25562 Pain in left knee: Secondary | ICD-10-CM | POA: Diagnosis not present

## 2020-08-02 DIAGNOSIS — M6281 Muscle weakness (generalized): Secondary | ICD-10-CM | POA: Diagnosis not present

## 2020-08-02 DIAGNOSIS — M62838 Other muscle spasm: Secondary | ICD-10-CM

## 2020-08-02 DIAGNOSIS — Z96652 Presence of left artificial knee joint: Secondary | ICD-10-CM

## 2020-08-02 DIAGNOSIS — R600 Localized edema: Secondary | ICD-10-CM

## 2020-08-02 DIAGNOSIS — R262 Difficulty in walking, not elsewhere classified: Secondary | ICD-10-CM | POA: Diagnosis not present

## 2020-08-02 DIAGNOSIS — G8929 Other chronic pain: Secondary | ICD-10-CM

## 2020-08-02 DIAGNOSIS — M25662 Stiffness of left knee, not elsewhere classified: Secondary | ICD-10-CM

## 2020-08-02 DIAGNOSIS — M79605 Pain in left leg: Secondary | ICD-10-CM

## 2020-08-02 NOTE — Progress Notes (Signed)
Post-Op Visit Note   Patient: Summer [name]           Date of Birth: 1950-11-01           MRN: [id number] Visit Date: 08/02/2020 PCP: Hoyt Koch, MD   Assessment & Plan:  Chief Complaint:  Chief Complaint  Patient presents with  . Left Knee - Routine Post Op   Visit Diagnoses:  1. Status post left knee replacement     Plan:  Summer [name] is a 69 year old female who is 6 weeks status post left total knee replacement.  Currently doing outpatient PT at our office twice a week.  Occasionally has some sharp pains around these surgical scar but she is overall very happy.  Ambulating with a cane.  Surgical scar is fully healed.  Range of motion is improving as expected.  No signs of infection.  Moderate swelling.  No calf tenderness.  X-rays show stable total knee replacement.  Patient is doing very well for her 6-week appointment.  She may drive and increase activity as tolerated.  Continue with outpatient PT.  Dental prophylaxis reinforced.  Recheck in 6 weeks.  Follow-Up Instructions: Return in about 6 weeks (around 09/13/2020).   Orders:  Orders Placed This Encounter  Procedures  . XR Knee 1-2 Views Left   No orders of the defined types were placed in this encounter.   Imaging: XR Knee 1-2 Views Left  Result Date: 08/02/2020 Stable total knee replacement in good alignment.    PMFS History: Patient Active Problem List   Diagnosis Date Noted  . Status post total left knee replacement 06/20/2020  . Primary osteoarthritis of left knee 06/19/2020  . Left knee pain 02/26/2020  . Bilateral arm numbness and tingling while sleeping 02/26/2020  . COVID-19 11/06/2019  . Muscle cramps 05/12/2019  . Cramps, muscle, general 04/23/2019  . Tinnitus 04/16/2019  . Chest pain of uncertain etiology 47/82/9562  . Bilateral leg edema 02/10/2019  . Port-A-Cath in place 01/30/2019  . Anemia, iron deficiency 11/21/2018  . Malignant neoplasm of upper-outer quadrant  of right breast in female, estrogen receptor positive ([place]) 11/17/2018  . Itching 10/13/2018  . Left eye pain 07/01/2018  . Constipation 07/01/2018  . Weight loss 07/22/2017  . Memory loss 12/21/2016  . Tremor 12/21/2016  . Right sided temporal headache 12/21/2016  . Trochanteric bursitis, right hip 10/26/2016  . Pruritus 10/26/2016  . Foot sprain, left, initial encounter 08/31/2016  . Pain of right side of body 08/31/2016  . Headache 04/24/2016  . LLQ pain 04/24/2016  . Glossitis 02/02/2016  . Routine general medical examination at a health care facility 10/27/2015  . Facial pain 10/06/2015  . Trigeminal neuralgia of right side of face 11/30/2010  . Morbid obesity ([place]) 11/09/2010  . Fibromyalgia 11/09/2010  . HEMATURIA, HX OF 11/14/2009  . LOC OSTEOARTHROS NOT SPEC PRIM/SEC OTH [facility] SITE 06/27/2009  . Allergic rhinitis 08/26/2008  . 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 Abdominal pain, left lower quadrant 11/09/2009   Qualifier: Diagnosis of  By: Valma Cava LPN, Izora Gala    . Allergic rhinitis 08/26/2008   Qualifier: Diagnosis of  By: Sherren Mocha MD, Jory Ee   . Anemia, iron deficiency 11/21/2018  . Arthritis shoulders and back  . BACK PAIN, CHRONIC 06/13/2007   Qualifier: Diagnosis of  By: Sherren Mocha MD, Jory Ee   . 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 Chronic facial pain right side due  to trigeminal pain  . Complication of anesthesia    Pt states having some "difficulty waking up"  . Constipation 07/01/2018  . Costochondritis 05/04/2014  . Diverticulitis of colon 06/13/2007   S/p segmental colectomy for perforated diverticulitis   . Diverticulosis   . Facial pain 10/06/2015  . Fibromyalgia   . Foot sprain, left, initial encounter 08/31/2016  . Glossitis 02/02/2016  . Headache 04/24/2016  . HEMATURIA UNSPECIFIED 06/25/2008   Qualifier: Diagnosis of  By: Sherren Mocha MD, Jory Ee   . HEMATURIA, HX OF 11/14/2009   Qualifier: Diagnosis of  By:  Regino Schultze CMA (AAMA), Apolonio Schneiders    . History of kidney stones   . History of kidney stones   . History of shingles 2012--  no residual pain  . Itching 10/13/2018  . Left eye pain 07/01/2018  . Left ureteral calculus   . LLQ pain 04/24/2016  . LOC OSTEOARTHROS NOT SPEC PRIM/SEC OTH [facility] SITE 06/27/2009   Qualifier: Diagnosis of  By: Sherren Mocha MD, Jory Ee   . Malignant neoplasm of upper-outer quadrant of right breast in female, estrogen receptor positive ([place]) 11/17/2018  . MCI (mild cognitive impairment)   . Memory loss 12/21/2016  . Morbid obesity ([place]) 11/09/2010  . Pain of right side of body 08/31/2016  . Personal history of chemotherapy   . Personal history of radiation therapy   . Pruritus 10/26/2016  . Renal disorder   . Right sided temporal headache 12/21/2016  . Tremor 12/21/2016  . Trigeminal neuralgia RIGHT  . Trigeminal neuralgia of right side of face 11/30/2010  . Trochanteric bursitis, right hip 10/26/2016  . Urgency of urination   . Weight loss 07/22/2017  . Zoster without complications 0/05/3817    Family History  Problem Relation Age of Onset  . Heart disease Maternal Grandmother   . Diabetes Mother   . Breast cancer Maternal Aunt   . Lupus Cousin   . Colon cancer Neg Hx   . Esophageal cancer Neg Hx   . Rectal cancer Neg Hx   . Stomach cancer Neg Hx     Past Surgical History:  Procedure Laterality Date  . BREAST BIOPSY Right 11/11/2018  . BREAST LUMPECTOMY Right 11/28/2018  . BREAST LUMPECTOMY WITH RADIOACTIVE SEED AND SENTINEL LYMPH NODE BIOPSY Right 11/28/2018   Procedure: RIGHT BREAST LUMPECTOMY WITH RADIOACTIVE SEED AND RIGHT AXILLARY SENTINEL LYMPH NODE BIOPSY;  Surgeon: Excell Seltzer, MD;  Location: [place];  Service: General;  Laterality: Right;  . CEREBRAL MICROVASCULAR DECOMPRESSION  07-05-2006   RIGHT TRIGEMINAL NERVE  . CYSTOSCOPY/RETROGRADE/URETEROSCOPY/STONE EXTRACTION WITH BASKET  07/04/2012   Procedure: CYSTOSCOPY/RETROGRADE/URETEROSCOPY/STONE EXTRACTION WITH  BASKET;  Surgeon: Claybon Jabs, MD;  Location: [facility];  Service: Urology;  Laterality: Left;  . EXPLORATORY LAPAROTOMY/ RESECTION MID TO DISTAL SIGMOID AND PROXIMAL RECTUM/ END PROXIMAL SIGMOID COLOSTOMY  07-17-2006   PERFORATED DIVERTICULITIS WITH PERITONITIS  . gamma knife  05/17/2016   for trigeminal neuralgia, [facility], Dr Salomon Fick  . gamma knife  2019  . IR CV LINE INJECTION  10/27/2019  . PORTACATH PLACEMENT Left 11/28/2018   Procedure: INSERTION PORT-A-CATH WITH ULTRASOUND;  Surgeon: Excell Seltzer, MD;  Location: [place];  Service: General;  Laterality: Left;  . portacath removal    . RESECTION COLOSTOMY/ CLOSURE COLOSTOMY WITH COLOPROCTOSTOMY  02-20-2007  . TONSILLECTOMY  AS CHILD  . TOTAL KNEE ARTHROPLASTY Left 06/20/2020   Procedure: LEFT TOTAL KNEE ARTHROPLASTY;  Surgeon: Leandrew Koyanagi, MD;  Location: [place];  Service: Orthopedics;  Laterality: Left;  . URETEROSCOPY  07/04/2012   Procedure: URETEROSCOPY;  Surgeon: Claybon Jabs, MD;  Location: [facility];  Service: Urology;  Laterality: Left;  Marland Kitchen VAGINAL HYSTERECTOMY  1998   Partial  . WISDOM TOOTH EXTRACTION     Social History   Occupational History    Employer: UNEMPLOYED  . Occupation: HUMAN RESOURCES     Employer: [employer]  Tobacco Use  . Smoking status: Never Smoker  . Smokeless tobacco: Never Used  Vaping Use  . Vaping Use: Never used  Substance and Sexual Activity  . Alcohol use: Yes    Comment: rarely  . Drug use: No  . Sexual activity: Not on file

## 2020-08-02 NOTE — Therapy (Signed)
[facility] [address] [place], [place], [address] Phone: [phone]   Fax:  [phone]  Physical Therapy Treatment  Patient Details  Name: Summer [name] MRN: [id number] Date of Birth: [birth year] Referring Provider (PT): Dr. Erlinda Hong   Encounter Date: 08/02/2020   PT End of Session - 08/02/20 1148    Visit Number 7    Number of Visits 16    Date for PT Re-Evaluation 09/19/20    Authorization Type [place], Medicare    Progress Note Due on Visit 10    PT Start Time 1100    PT Stop Time 1145    PT Time Calculation (min) 45 min    Activity Tolerance Patient tolerated treatment well    Behavior During Therapy [facility] for tasks assessed/performed           Past Medical History:  Diagnosis Date  . Abdominal pain, left lower quadrant 11/09/2009   Qualifier: Diagnosis of  By: Valma Cava LPN, Izora Gala    . Allergic rhinitis 08/26/2008   Qualifier: Diagnosis of  By: Sherren Mocha MD, Jory Ee   . Anemia, iron deficiency 11/21/2018  . Arthritis shoulders and back  . BACK PAIN, CHRONIC 06/13/2007   Qualifier: Diagnosis of  By: Sherren Mocha MD, Jory Ee   . 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 Complication of anesthesia    Pt states having some "difficulty waking up"  . Constipation 07/01/2018  . Costochondritis 05/04/2014  . Diverticulitis of colon 06/13/2007   S/p segmental colectomy for perforated diverticulitis   . Diverticulosis   . Facial pain 10/06/2015  . Fibromyalgia   . Foot sprain, left, initial encounter 08/31/2016  . Glossitis 02/02/2016  . Headache 04/24/2016  . HEMATURIA UNSPECIFIED 06/25/2008   Qualifier: Diagnosis of  By: Sherren Mocha MD, Jory Ee   . HEMATURIA, HX OF 11/14/2009   Qualifier: Diagnosis of  By: Regino Schultze CMA (AAMA), Apolonio Schneiders    . History of kidney stones   . History of kidney stones   . History of shingles 2012--  no residual pain  . Itching 10/13/2018  . Left eye pain 07/01/2018  .  Left ureteral calculus   . LLQ pain 04/24/2016  . LOC OSTEOARTHROS NOT SPEC PRIM/SEC OTH [facility] SITE 06/27/2009   Qualifier: Diagnosis of  By: Sherren Mocha MD, Jory Ee   . Malignant neoplasm of upper-outer quadrant of right breast in female, estrogen receptor positive ([place]) 11/17/2018  . MCI (mild cognitive impairment)   . Memory loss 12/21/2016  . Morbid obesity ([place]) 11/09/2010  . Pain of right side of body 08/31/2016  . Personal history of chemotherapy   . Personal history of radiation therapy   . Pruritus 10/26/2016  . Renal disorder   . Right sided temporal headache 12/21/2016  . Tremor 12/21/2016  . Trigeminal neuralgia RIGHT  . Trigeminal neuralgia of right side of face 11/30/2010  . Trochanteric bursitis, right hip 10/26/2016  . Urgency of urination   . Weight loss 07/22/2017  . Zoster without complications 3/54/5625    Past Surgical History:  Procedure Laterality Date  . BREAST BIOPSY Right 11/11/2018  . BREAST LUMPECTOMY Right 11/28/2018  . BREAST LUMPECTOMY WITH RADIOACTIVE SEED AND SENTINEL LYMPH NODE BIOPSY Right 11/28/2018   Procedure: RIGHT BREAST LUMPECTOMY WITH RADIOACTIVE SEED AND RIGHT AXILLARY SENTINEL LYMPH NODE BIOPSY;  Surgeon: Excell Seltzer, MD;  Location: [place];  Service: General;  Laterality: Right;  . CEREBRAL MICROVASCULAR DECOMPRESSION  07-05-2006  RIGHT TRIGEMINAL NERVE  . CYSTOSCOPY/RETROGRADE/URETEROSCOPY/STONE EXTRACTION WITH BASKET  07/04/2012   Procedure: CYSTOSCOPY/RETROGRADE/URETEROSCOPY/STONE EXTRACTION WITH BASKET;  Surgeon: Claybon Jabs, MD;  Location: [facility];  Service: Urology;  Laterality: Left;  . EXPLORATORY LAPAROTOMY/ RESECTION MID TO DISTAL SIGMOID AND PROXIMAL RECTUM/ END PROXIMAL SIGMOID COLOSTOMY  07-17-2006   PERFORATED DIVERTICULITIS WITH PERITONITIS  . gamma knife  05/17/2016   for trigeminal neuralgia, [facility], Dr Salomon Fick  . gamma knife  2019  . IR CV LINE INJECTION  10/27/2019  . PORTACATH PLACEMENT Left 11/28/2018    Procedure: INSERTION PORT-A-CATH WITH ULTRASOUND;  Surgeon: Excell Seltzer, MD;  Location: [place];  Service: General;  Laterality: Left;  . portacath removal    . RESECTION COLOSTOMY/ CLOSURE COLOSTOMY WITH COLOPROCTOSTOMY  02-20-2007  . TONSILLECTOMY  AS CHILD  . TOTAL KNEE ARTHROPLASTY Left 06/20/2020   Procedure: LEFT TOTAL KNEE ARTHROPLASTY;  Surgeon: Leandrew Koyanagi, MD;  Location: [place];  Service: Orthopedics;  Laterality: Left;  . URETEROSCOPY  07/04/2012   Procedure: URETEROSCOPY;  Surgeon: Claybon Jabs, MD;  Location: [facility];  Service: Urology;  Laterality: Left;  Marland Kitchen VAGINAL HYSTERECTOMY  1998   Partial  . WISDOM TOOTH EXTRACTION      There were no vitals filed for this visit.   Subjective Assessment - 08/02/20 1106    Subjective Pt arriving to therapy reporting good report from doctor Erlinda Hong. Pt reporting no pain upon arrival but pain can vary at different times.    Limitations Sitting;Lifting;Standing;Walking;House hold activities    Patient Stated Goals Get back to walking, gardening, cleaning house, walking independent    Currently in Pain? No/denies              [facility] PT Assessment - 08/02/20 0001      AROM   Left Knee Extension -5    Left Knee Flexion 105                         [facility] Adult PT Treatment/Exercise - 08/02/20 0001      Knee/Hip Exercises: Aerobic   Recumbent Bike seat 6  L3 10 mins      Knee/Hip Exercises: Machines for Strengthening   Cybex Knee Extension Eccentric Lt LE 3 x 10 10 lbs    Total Gym Leg Press SL 3 x 10 50 lbs, performed bilateral      Knee/Hip Exercises: Standing   Forward Step Up Both;2 sets;10 reps;Hand Hold: 0;Step Height: 6"    Step Down Right;10 reps;Hand Hold: 1;Step Height: 6"    Step Down Limitations Pt with more difficulty noted.                     PT Short Term Goals - 07/22/20 1021      PT SHORT TERM GOAL #1   Title Patient will demonstrate independent use of home  exercise program to maintain progress from in clinic treatments.    Time 3    Period Weeks    Status On-going    Target Date 08/01/20             PT Long Term Goals - 07/22/20 1022      PT LONG TERM GOAL #1   Title Patient will demonstrate/report pain at worst less than or equal to 2/10 to facilitate minimal limitation in daily activity secondary to pain symptoms.    Time 10    Period Weeks  Status On-going      PT LONG TERM GOAL #2   Title Patient will demonstrate independent use of home exercise program to facilitate ability to maintain/progress functional gains from skilled physical therapy services.    Time 10    Period Weeks    Status On-going      PT LONG TERM GOAL #3   Title Pt. will demonstrate Lt knee AROM 0-110 deg to facilitate ability to perform walking, stairs, squat, transfers at PLOF s limitation.    Time 10    Period Weeks    Status On-going      PT LONG TERM GOAL #4   Title Pt. will demonstrate Lt LE MMT 5/5 throughout to facilitate reciprocal gait on stairs, independent ambulation.    Time 10    Period Weeks    Status On-going      PT LONG TERM GOAL #5   Title Pt. will demonstrate Lt SLS > 15 seconds to facilitate stability on uneven surfaces ambulation independent.    Time 10    Period Weeks    Status On-going      PT LONG TERM GOAL #6   Title Pt. will demonstrate independent ambulation at PLOF community distances > 300 ft.    Time 10    Period Weeks    Status On-going                 Plan - 08/02/20 1125    Clinical Impression Statement Pt arriving reporting no pain. Pt reporting mild inrease in pain with stepping down off 6 inch step. Pt making progress with ROM and strengthening. Continue skilled PT to progress toward LTG's.    Personal Factors and Comorbidities Comorbidity 2    Comorbidities Fibromyalgia, obesity    Examination-Activity Limitations Bathing;Sit;Sleep;Bed  Mobility;Bend;Squat;Stairs;Carry;Stand;Transfers;Lift;Locomotion Level    Examination-Participation Restrictions Yard Work;Cleaning;Community Activity    Stability/Clinical Decision Making Stable/Uncomplicated    Rehab Potential Good    PT Frequency Other (comment)    PT Treatment/Interventions ADLs/Self Care Home Management;Cryotherapy;Electrical Stimulation;Iontophoresis 4mg /ml Dexamethasone;Moist Heat;Balance training;Therapeutic exercise;Therapeutic activities;Functional mobility training;Stair training;Gait training;Ultrasound;Neuromuscular re-education;Patient/family education;Manual techniques;Vasopneumatic Device;Taping;Dry needling;Passive range of motion;Joint Manipulations    PT Next Visit Plan Extension end range, WB strengthening, balance intervention progression.    PT Home Exercise Plan [id number]    Consulted and Agree with Plan of Care Patient           Patient will benefit from skilled therapeutic intervention in order to improve the following deficits and impairments:  Abnormal gait, Decreased endurance, Hypomobility, Increased edema, Decreased activity tolerance, Decreased strength, Pain, Increased muscle spasms, Difficulty walking, Decreased mobility, Decreased balance, Decreased range of motion, Improper body mechanics, Impaired perceived functional ability, Impaired flexibility, Decreased coordination  Visit Diagnosis: Chronic pain of left knee  Muscle weakness (generalized)  Stiffness of left knee, not elsewhere classified  Difficulty in walking, not elsewhere classified  Localized edema  Pain in left leg  Other muscle spasm     Problem List Patient Active Problem List   Diagnosis Date Noted  . Status post total left knee replacement 06/20/2020  . Primary osteoarthritis of left knee 06/19/2020  . Left knee pain 02/26/2020  . Bilateral arm numbness and tingling while sleeping 02/26/2020  . COVID-19 11/06/2019  . Muscle cramps 05/12/2019  . Cramps,  muscle, general 04/23/2019  . Tinnitus 04/16/2019  . Chest pain of uncertain etiology 60/63/0160  . Bilateral leg edema 02/10/2019  . Port-A-Cath in place 01/30/2019  . Anemia, iron deficiency 11/21/2018  .  Malignant neoplasm of upper-outer quadrant of right breast in female, estrogen receptor positive ([place]) 11/17/2018  . Itching 10/13/2018  . Left eye pain 07/01/2018  . Constipation 07/01/2018  . Weight loss 07/22/2017  . Memory loss 12/21/2016  . Tremor 12/21/2016  . Right sided temporal headache 12/21/2016  . Trochanteric bursitis, right hip 10/26/2016  . Pruritus 10/26/2016  . Foot sprain, left, initial encounter 08/31/2016  . Pain of right side of body 08/31/2016  . Headache 04/24/2016  . LLQ pain 04/24/2016  . Glossitis 02/02/2016  . Routine general medical examination at a health care facility 10/27/2015  . Facial pain 10/06/2015  . Trigeminal neuralgia of right side of face 11/30/2010  . Morbid obesity ([place]) 11/09/2010  . Fibromyalgia 11/09/2010  . HEMATURIA, HX OF 11/14/2009  . LOC OSTEOARTHROS NOT SPEC PRIM/SEC OTH [facility] SITE 06/27/2009  . Allergic rhinitis 08/26/2008  . 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 [place], [place], [address] Phone: [phone]   Fax:  [phone]  Name: Summer [name] MRN: [id number] Date of Birth: 08/10/1951

## 2020-08-07 ENCOUNTER — Encounter: Payer: Self-pay | Admitting: Orthopaedic Surgery

## 2020-08-08 ENCOUNTER — Other Ambulatory Visit: Payer: Self-pay

## 2020-08-08 ENCOUNTER — Other Ambulatory Visit: Payer: Self-pay | Admitting: Physician Assistant

## 2020-08-08 ENCOUNTER — Ambulatory Visit (INDEPENDENT_AMBULATORY_CARE_PROVIDER_SITE_OTHER): Payer: Federal, State, Local not specified - PPO | Admitting: Rehabilitative and Restorative Service Providers"

## 2020-08-08 ENCOUNTER — Encounter: Payer: Self-pay | Admitting: Rehabilitative and Restorative Service Providers"

## 2020-08-08 DIAGNOSIS — M6281 Muscle weakness (generalized): Secondary | ICD-10-CM

## 2020-08-08 DIAGNOSIS — G8929 Other chronic pain: Secondary | ICD-10-CM

## 2020-08-08 DIAGNOSIS — R262 Difficulty in walking, not elsewhere classified: Secondary | ICD-10-CM | POA: Diagnosis not present

## 2020-08-08 DIAGNOSIS — R600 Localized edema: Secondary | ICD-10-CM

## 2020-08-08 DIAGNOSIS — M25662 Stiffness of left knee, not elsewhere classified: Secondary | ICD-10-CM | POA: Diagnosis not present

## 2020-08-08 DIAGNOSIS — M25562 Pain in left knee: Secondary | ICD-10-CM | POA: Diagnosis not present

## 2020-08-08 MED ORDER — TRAMADOL HCL 50 MG PO TABS: ORAL_TABLET | ORAL | 0 refills | Status: DC

## 2020-08-08 NOTE — Telephone Encounter (Signed)
I was able to send in tramadol

## 2020-08-08 NOTE — Therapy (Signed)
[facility] Physical Therapy [address] [place], [place], [address] Phone: [phone]   Fax:  [phone]  Physical Therapy Treatment  Patient Details  Name: Summer [name] MRN: [id number] Date of Birth: 08/08/1951 Referring Provider (PT): Dr. Erlinda Hong   Encounter Date: 08/08/2020   PT End of Session - 08/08/20 1020    Visit Number 8    Number of Visits 16    Date for PT Re-Evaluation 09/19/20    Authorization Type [place], Medicare    Progress Note Due on Visit 10    PT Start Time 1012    PT Stop Time 1100    PT Time Calculation (min) 48 min    Activity Tolerance Patient tolerated treatment well    Behavior During Therapy [facility] for tasks assessed/performed           Past Medical History:  Diagnosis Date  . Abdominal pain, left lower quadrant 11/09/2009   Qualifier: Diagnosis of  By: Valma Cava LPN, Izora Gala    . Allergic rhinitis 08/26/2008   Qualifier: Diagnosis of  By: Sherren Mocha MD, Jory Ee   . Anemia, iron deficiency 11/21/2018  . Arthritis shoulders and back  . BACK PAIN, CHRONIC 06/13/2007   Qualifier: Diagnosis of  By: Sherren Mocha MD, Jory Ee   . 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 Complication of anesthesia    Pt states having some "difficulty waking up"  . Constipation 07/01/2018  . Costochondritis 05/04/2014  . Diverticulitis of colon 06/13/2007   S/p segmental colectomy for perforated diverticulitis   . Diverticulosis   . Facial pain 10/06/2015  . Fibromyalgia   . Foot sprain, left, initial encounter 08/31/2016  . Glossitis 02/02/2016  . Headache 04/24/2016  . HEMATURIA UNSPECIFIED 06/25/2008   Qualifier: Diagnosis of  By: Sherren Mocha MD, Jory Ee   . HEMATURIA, HX OF 11/14/2009   Qualifier: Diagnosis of  By: Regino Schultze CMA (AAMA), Apolonio Schneiders    . History of kidney stones   . History of kidney stones   . History of shingles 2012--  no residual pain  . Itching 10/13/2018  . Left eye pain 07/01/2018  .  Left ureteral calculus   . LLQ pain 04/24/2016  . LOC OSTEOARTHROS NOT SPEC PRIM/SEC OTH [facility] SITE 06/27/2009   Qualifier: Diagnosis of  By: Sherren Mocha MD, Jory Ee   . Malignant neoplasm of upper-outer quadrant of right breast in female, estrogen receptor positive ([place]) 11/17/2018  . MCI (mild cognitive impairment)   . Memory loss 12/21/2016  . Morbid obesity ([place]) 11/09/2010  . Pain of right side of body 08/31/2016  . Personal history of chemotherapy   . Personal history of radiation therapy   . Pruritus 10/26/2016  . Renal disorder   . Right sided temporal headache 12/21/2016  . Tremor 12/21/2016  . Trigeminal neuralgia RIGHT  . Trigeminal neuralgia of right side of face 11/30/2010  . Trochanteric bursitis, right hip 10/26/2016  . Urgency of urination   . Weight loss 07/22/2017  . Zoster without complications 05/08/9370    Past Surgical History:  Procedure Laterality Date  . BREAST BIOPSY Right 11/11/2018  . BREAST LUMPECTOMY Right 11/28/2018  . BREAST LUMPECTOMY WITH RADIOACTIVE SEED AND SENTINEL LYMPH NODE BIOPSY Right 11/28/2018   Procedure: RIGHT BREAST LUMPECTOMY WITH RADIOACTIVE SEED AND RIGHT AXILLARY SENTINEL LYMPH NODE BIOPSY;  Surgeon: Excell Seltzer, MD;  Location: [place];  Service: General;  Laterality: Right;  . CEREBRAL MICROVASCULAR DECOMPRESSION  07-05-2006  RIGHT TRIGEMINAL NERVE  . CYSTOSCOPY/RETROGRADE/URETEROSCOPY/STONE EXTRACTION WITH BASKET  07/04/2012   Procedure: CYSTOSCOPY/RETROGRADE/URETEROSCOPY/STONE EXTRACTION WITH BASKET;  Surgeon: Claybon Jabs, MD;  Location: [facility];  Service: Urology;  Laterality: Left;  . EXPLORATORY LAPAROTOMY/ RESECTION MID TO DISTAL SIGMOID AND PROXIMAL RECTUM/ END PROXIMAL SIGMOID COLOSTOMY  07-17-2006   PERFORATED DIVERTICULITIS WITH PERITONITIS  . gamma knife  05/17/2016   for trigeminal neuralgia, [facility], Dr Salomon Fick  . gamma knife  2019  . IR CV LINE INJECTION  10/27/2019  . PORTACATH PLACEMENT Left 11/28/2018     Procedure: INSERTION PORT-A-CATH WITH ULTRASOUND;  Surgeon: Excell Seltzer, MD;  Location: [place];  Service: General;  Laterality: Left;  . portacath removal    . RESECTION COLOSTOMY/ CLOSURE COLOSTOMY WITH COLOPROCTOSTOMY  02-20-2007  . TONSILLECTOMY  AS CHILD  . TOTAL KNEE ARTHROPLASTY Left 06/20/2020   Procedure: LEFT TOTAL KNEE ARTHROPLASTY;  Surgeon: Leandrew Koyanagi, MD;  Location: [place];  Service: Orthopedics;  Laterality: Left;  . URETEROSCOPY  07/04/2012   Procedure: URETEROSCOPY;  Surgeon: Claybon Jabs, MD;  Location: [facility];  Service: Urology;  Laterality: Left;  Marland Kitchen VAGINAL HYSTERECTOMY  1998   Partial  . WISDOM TOOTH EXTRACTION      There were no vitals filed for this visit.   Subjective Assessment - 08/08/20 1029    Subjective Pt. indicated some complaints at night and some stiffness and swelling after walking and more activity.    Limitations Sitting;Lifting;Standing;Walking;House hold activities    Patient Stated Goals Get back to walking, gardening, cleaning house, walking independent    Currently in Pain? No/denies    Pain Location Knee    Pain Orientation Left    Pain Descriptors / Indicators Tightness                             [facility] Adult PT Treatment/Exercise - 08/08/20 0001      Knee/Hip Exercises: Stretches   Gastroc Stretch 5 reps;30 seconds;Both   incline     Knee/Hip Exercises: Aerobic   Recumbent Bike seat 6 Lvl 3 10 mins      Knee/Hip Exercises: Machines for Strengthening   Cybex Knee Extension Eccentric Lt LE 3 x 10 10 lbs    Cybex Knee Flexion SL 3 x 10 15 lbs 3 x 10    Total Gym Leg Press SL 3 x 10 50 lbs, performed bilateral      Knee/Hip Exercises: Standing   Forward Step Up Step Height: 6";2 sets;10 reps;Both      Manual Therapy   Manual therapy comments seated Lt knee flexion, distraction, IR MWM                    PT Short Term Goals - 07/22/20 1021      PT SHORT TERM GOAL #1    Title Patient will demonstrate independent use of home exercise program to maintain progress from in clinic treatments.    Time 3    Period Weeks    Status On-going    Target Date 08/01/20             PT Long Term Goals - 07/22/20 1022      PT LONG TERM GOAL #1   Title Patient will demonstrate/report pain at worst less than or equal to 2/10 to facilitate minimal limitation in daily activity secondary to pain symptoms.    Time 10  Period Weeks    Status On-going      PT LONG TERM GOAL #2   Title Patient will demonstrate independent use of home exercise program to facilitate ability to maintain/progress functional gains from skilled physical therapy services.    Time 10    Period Weeks    Status On-going      PT LONG TERM GOAL #3   Title Pt. will demonstrate Lt knee AROM 0-110 deg to facilitate ability to perform walking, stairs, squat, transfers at PLOF s limitation.    Time 10    Period Weeks    Status On-going      PT LONG TERM GOAL #4   Title Pt. will demonstrate Lt LE MMT 5/5 throughout to facilitate reciprocal gait on stairs, independent ambulation.    Time 10    Period Weeks    Status On-going      PT LONG TERM GOAL #5   Title Pt. will demonstrate Lt SLS > 15 seconds to facilitate stability on uneven surfaces ambulation independent.    Time 10    Period Weeks    Status On-going      PT LONG TERM GOAL #6   Title Pt. will demonstrate independent ambulation at PLOF community distances > 300 ft.    Time 10    Period Weeks    Status On-going                 Plan - 08/08/20 1101    Clinical Impression Statement Pt. may continue to benefit from skilled PT services to improve strength to improve WB loading and progressive loading in steps, squats.  SPC use continued but sparingly in clinic.    Personal Factors and Comorbidities Comorbidity 2    Comorbidities Fibromyalgia, obesity    Examination-Activity Limitations Bathing;Sit;Sleep;Bed  Mobility;Bend;Squat;Stairs;Carry;Stand;Transfers;Lift;Locomotion Level    Examination-Participation Restrictions Yard Work;Cleaning;Community Activity    Stability/Clinical Decision Making Stable/Uncomplicated    Rehab Potential Good    PT Frequency Other (comment)    PT Treatment/Interventions ADLs/Self Care Home Management;Cryotherapy;Electrical Stimulation;Iontophoresis 4mg /ml Dexamethasone;Moist Heat;Balance training;Therapeutic exercise;Therapeutic activities;Functional mobility training;Stair training;Gait training;Ultrasound;Neuromuscular re-education;Patient/family education;Manual techniques;Vasopneumatic Device;Taping;Dry needling;Passive range of motion;Joint Manipulations    PT Next Visit Plan Extension end range, WB strengthening, balance intervention progression.    PT Home Exercise Plan [id number]    Consulted and Agree with Plan of Care Patient           Patient will benefit from skilled therapeutic intervention in order to improve the following deficits and impairments:  Abnormal gait, Decreased endurance, Hypomobility, Increased edema, Decreased activity tolerance, Decreased strength, Pain, Increased muscle spasms, Difficulty walking, Decreased mobility, Decreased balance, Decreased range of motion, Improper body mechanics, Impaired perceived functional ability, Impaired flexibility, Decreased coordination  Visit Diagnosis: Chronic pain of left knee  Muscle weakness (generalized)  Stiffness of left knee, not elsewhere classified  Difficulty in walking, not elsewhere classified  Localized edema     Problem List Patient Active Problem List   Diagnosis Date Noted  . Status post total left knee replacement 06/20/2020  . Primary osteoarthritis of left knee 06/19/2020  . Left knee pain 02/26/2020  . Bilateral arm numbness and tingling while sleeping 02/26/2020  . COVID-19 11/06/2019  . Muscle cramps 05/12/2019  . Cramps, muscle, general 04/23/2019  . Tinnitus  04/16/2019  . Chest pain of uncertain etiology 66/02/3015  . Bilateral leg edema 02/10/2019  . Port-A-Cath in place 01/30/2019  . Anemia, iron deficiency 11/21/2018  . Malignant neoplasm of upper-outer quadrant of  right breast in female, estrogen receptor positive ([place]) 11/17/2018  . Itching 10/13/2018  . Left eye pain 07/01/2018  . Constipation 07/01/2018  . Weight loss 07/22/2017  . Memory loss 12/21/2016  . Tremor 12/21/2016  . Right sided temporal headache 12/21/2016  . Trochanteric bursitis, right hip 10/26/2016  . Pruritus 10/26/2016  . Foot sprain, left, initial encounter 08/31/2016  . Pain of right side of body 08/31/2016  . Headache 04/24/2016  . LLQ pain 04/24/2016  . Glossitis 02/02/2016  . Routine general medical examination at a health care facility 10/27/2015  . Facial pain 10/06/2015  . Trigeminal neuralgia of right side of face 11/30/2010  . Morbid obesity ([place]) 11/09/2010  . Fibromyalgia 11/09/2010  . HEMATURIA, HX OF 11/14/2009  . LOC OSTEOARTHROS NOT SPEC PRIM/SEC OTH [facility] SITE 06/27/2009  . Allergic rhinitis 08/26/2008  . 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 [place], [place], [address] Phone: [phone]   Fax:  [phone]  Name: Ticara Waner MRN: [id number] Date of Birth: [birth year]

## 2020-08-10 ENCOUNTER — Telehealth: Payer: Self-pay | Admitting: Neurology

## 2020-08-10 ENCOUNTER — Telehealth (INDEPENDENT_AMBULATORY_CARE_PROVIDER_SITE_OTHER): Payer: Federal, State, Local not specified - PPO | Admitting: Neurology

## 2020-08-10 DIAGNOSIS — G8929 Other chronic pain: Secondary | ICD-10-CM | POA: Diagnosis not present

## 2020-08-10 DIAGNOSIS — M25552 Pain in left hip: Secondary | ICD-10-CM

## 2020-08-10 DIAGNOSIS — G500 Trigeminal neuralgia: Secondary | ICD-10-CM | POA: Diagnosis not present

## 2020-08-10 DIAGNOSIS — M5442 Lumbago with sciatica, left side: Secondary | ICD-10-CM

## 2020-08-10 MED ORDER — GABAPENTIN 600 MG PO TABS
ORAL_TABLET | ORAL | 4 refills | Status: DC
Start: 2020-08-10 — End: 2021-10-17

## 2020-08-10 NOTE — Telephone Encounter (Signed)
Medicare/bcbs fed order sent to GI. No auth they will reach out to the patient to schedule.  °

## 2020-08-10 NOTE — Progress Notes (Signed)
PATIENT: Summer [name] DOB: October 19, 1950  REASON FOR VISIT: follow up HISTORY FROM: patient  HISTORY OF PRESENT ILLNESS: Today 08/10/20  Summer [name] is a 69 year old female presenting for follow-up for trigeminal neuralgia. She has had 2 gamma knife procedures most recent in 2019. She thinks she isn't feeling as good as she had hoped, in fact she thinks it may have made her worse. She still has pain to right side of her face. Pain under her eye, and pain under her chin. Her tongue sometimes feels like sandpaper. She is taking gabapentin 600 mg capsules, three in the morning, three midday, two at bedtime. When she takes the gabapentin it settles the pain down, making it manageable. Rarely she will take lamictal at bedtime. She reports 25% of the day she is pain free. In the morning is the worst pain. She doesn't like Oxtellar, because it doesn't work in her opinion. In the past she has tried lyrica, gabapentin, tegretol, trileptal, and cymbalta. 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    HISTORY 10/10/17 [name] is a 69 years old right-handed African American female, referred by her primary care physician Dr. Stevie Kern for evaluation of right facial pain  She was diagnosed with right trigeminal neuralgia in 2006, presenting with severe electronics shooting pain at her right upper molar, she had microvascular decompression surgery at [facility] by Dr. Harrison Mons around 2006, which has helped the severe pain, but she continued to have annoying almost constant pain at her right face, she describes hot cold sensitivity over right face, touching of right forehead region will also set of her right facial pain, she felt right tongue swelling, she bites on her right tongue sometimes, she works at customer service, on the phone many hours each day, with prolonged talking, she felt sandpaper was rubbing against her right tongue, also complains of right upper  and lower lip numbness, intermittent electronics shooting pain,  Over the years, she has tried different medication, was evaluated by our office Dr. Brett Fairy in 2008, she has tried gabapentin, Lyrica, Cymbalta, Trileptal, currently is taking Tegretol 200 mg twice a day, initially it helps her some, but failed to help work consistently, She denied visual change, recent few months of left lower back, left hip pain, radiating to left posterior thigh, to her left leg and left foot, difficulty walking after prolonged sitting She complains of left low back pain, left hip pain, radiating pain to left foot, like walking on a dead, burning sensation, she continued to complains of right facial constant achy pain, there was no significant change after started on Oxtellar xR 300 mg every day, no significant side effects,  I have reviewed MRI, mild small vessel disease, no significant abnormality otherwise,  She had MRI lumbar spine (without) demonstrating: L5-S1: facet hypertrophy with moderate right and severe left foraminal stenosis  L4-5: pseudo disc bulging and facet hypertrophy with moderate right and mild left foraminal stenosis. There is 2 mm anterior spondylolisthesis of L4 on L5.  Degenerative spondylosis and disc bulging L1-2 and L4-5.  UPDATE Sep 30 2014:Summer [name] She felt sand paper sensation in her right tongue, flashing sensation, last 1-2 weeks, only take gabapentin 379m tid now. She also complains of snoring, frequent wakening during the nighttime, excessive sleepiness at daytime, F ESS score is 37, ESS is 9  UPDATE 01/26/2017CMMs. LKhatib 69year old female returns for follow-up. She has a history of right trigeminal neuralgia is currently on Neurontin  300 mg 3 times a day has breakthrough pain. 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 She occasionally will have upper and lower lip numbness. She returns  for reevaluation  UPDATE February 09 2016:Summer [name] She complained increased right facial pain since April 2017, involving right V1/V2 branch, she is taking gabapentin 300 mg 4 times a day, only helped her mildly, triggered by wind blow on her right face, talking, chewing, she works as a Restaurant manager, fast food, it is very difficult for her to perform her job,  Update March 01 2016:Summer [name] She presented to the emergency room February 21 2016 for nausea, unexpected fall, night before that because severe facial pain, she took extra dose of lamotrigine, made it to 250 mg that particular day, laboratory evaluation showed mildly low potassium of CMP WAS GIVEN SUPPLEMENT, NORMAL CBC, lamotrigine level in June 15 was 15.6,  For a while, she had severe right facial pain, was taking gabapentin 300 mg 2 tablets every 2 hours, 3 tablets at night, about 11 tablets each day 3300 mg every day plus lamotrigine 100 mg twice a day,   Lamotrigine 100 mg twice a day has been very helpful, it has taking care of her long bothered right tongue sandpaper sensation. She is now taking gabapentin 300 mg 3/2/3 tablets each day, she has been off lamotrigine since February 26 2016, the sandpaper sensation on the right side of the tongue came back, she is now taking Cymbalta 30 mg 3 times a day  She has been complains 3 weeks history of urinary urgency, difficulty initiating urine, UA showed mild UTI, was treated with Cipro without improving her symptoms  UPDATE May 10 2016:Summer [name] She was evaluated by Dr. Salomon Fick in July 2017, is planning on to have gamma knife in early September, she complains of mild memory loss, difficulty handling her computer tasks sometimes, there was no family history of memory loss, she also complains of episode, right after she get out of the bed, She is on polypharmacy, now taking lamotrigine 100 mg twice a day, gabapentin 300 mg 4 in the morning, 2 at lunch time, 4 tablets at nighttime. She also has obesity, snoring,  excessive daytime fatigue and sleepiness  We have personally reviewed CT head without contrast in June 2017, no acute abnormality, MRI of the cervical in 2008, multilevel degenerative changes, most severe at C5-6, C6-7, with herniated disc, mild canal stenosis, mild cord signal changes.  Laboratory evaluation in 2017, elevated WBC 11 point 8, hemoglobin of 13, no significant abnormality on BMP  UPDATE Apr 02 2017:Summer [name] We have personally reviewed MRI of the brain in April 2018 generalized atrophy mild supratentorium small vessel disease MRI of cervical spine April, multilevel degenerative changes most severe at C6-7, mild canal stenosissignificant foraminal narrowing or signal changes,  She had a, knife by [facility] Dr. Angelene Giovanni in September 2017 she denies significant improvement, now cold air still trigger her right facial pain, bleeding from right temporal region along the right face, right jaw, electronic shooting pain, to her right lower molar region, she has intermittent left handtremor,driving,  She is now taking Lamictal 100 mg 3 tablets each day, gabapentin 600 mg 5 tablets each day, despite combination therapy, she remains symptomatic, she is able to go to work. She is on the phone 10 hours a day, mild memory loss. I reviewed laboratory evaluation in 2018, negative troponin, CBC with hemoglobin  of 11.9, CMP, creatinine of 0.84, ESR, mild elevated C reactive protein 7.8, lamotrigine 12.9, TSH 1.2 7,   UPDATE Sept 1 2020: She was diagnosed with right breast cancer, had right lobectomy followed by chemo and radiation therapy, since April 2020, she noticed increased bilateral feet paresthesia, and also intermittent bilateral finger paresthesia, despite taking gabapentin 600 mg up to 3 times a day, her right trigeminal pain is under good control with current dose, she also has frequent lower extremity muscle spasm, has tried muscle relaxant methocarbamol with limited help  Virtual  Visit via Video  I connected with Summer [name] on 08/10/20 at  by Video and verified that I am speaking with the correct person using two identifiers.   I discussed the limitations, risks, security and privacy concerns of performing an evaluation and management service by video and the availability of in person appointments. I also discussed with the patient that there may be a patient responsible charge related to this service. The patient expressed understanding and agreed to proceed.   History of Present Illness: She has left knee replacement on Jun 20 2020, recovering well, still in the process of PT, out of work until Jan 2021.  She complains of left low back pain, radiating pain to left hip for 6 months, even before her left knee replacement, now made it worse by prolonged sitting,  I personally reviewed x-ray of lumbar in April 2021, significant multilevel degenerative disease, scoliosis  Her right facial trigeminal pain is under reasonable control with current dose of gabapentin 600 mg 2 tablets 3 times a day,    Observations/Objective: I have reviewed problem lists, medications, allergies.  Awake, alert, oriented to history taking and casual conversation, facial were symmetric, no dysarthria, no aphasia, moving upper extremity without difficulty, gait was deferred  Assessment and Plan: Right trigeminal neuralgia  Is under good control with current dose of gabapentin 600 mg 2 tablets 3 times a day, refill her prescription Worsening left-sided low back pain, radiating pain to left hip,  X-ray of lumbar region previously showed significant degenerative changes, scoliosis,  Likely left lumbar radiculopathy,  Proceed with MRI of lumbar spine  Follow Up Instructions:  6 months   I discussed the assessment and treatment plan with the patient. The patient was provided an opportunity to ask questions and all were answered. The patient agreed with the plan and demonstrated  an understanding of the instructions.   The patient was advised to call back or seek an in-person evaluation if the symptoms worsen or if the condition fails to improve as anticipated.   Marcial Pacas, MD

## 2020-08-11 ENCOUNTER — Ambulatory Visit (INDEPENDENT_AMBULATORY_CARE_PROVIDER_SITE_OTHER): Payer: Federal, State, Local not specified - PPO | Admitting: Rehabilitative and Restorative Service Providers"

## 2020-08-11 ENCOUNTER — Other Ambulatory Visit: Payer: Self-pay

## 2020-08-11 DIAGNOSIS — R262 Difficulty in walking, not elsewhere classified: Secondary | ICD-10-CM

## 2020-08-11 DIAGNOSIS — M25562 Pain in left knee: Secondary | ICD-10-CM | POA: Diagnosis not present

## 2020-08-11 DIAGNOSIS — M6281 Muscle weakness (generalized): Secondary | ICD-10-CM

## 2020-08-11 DIAGNOSIS — G8929 Other chronic pain: Secondary | ICD-10-CM

## 2020-08-11 DIAGNOSIS — R600 Localized edema: Secondary | ICD-10-CM

## 2020-08-11 DIAGNOSIS — M25662 Stiffness of left knee, not elsewhere classified: Secondary | ICD-10-CM

## 2020-08-11 NOTE — Therapy (Signed)
[facility] Physical Therapy [address] [place], [place], [address] Phone: [phone]   Fax:  [phone]  Physical Therapy Treatment  Patient Details  Name: Summer [name] MRN: [id number] Date of Birth: [birth year] Referring Provider (PT): Dr. Erlinda Hong   Encounter Date: 08/11/2020   PT End of Session - 08/11/20 0944    Visit Number 9    Number of Visits 16    Date for PT Re-Evaluation 09/19/20    Authorization Type [place], Medicare    Progress Note Due on Visit 10    PT Start Time 0932    PT Stop Time 1015    PT Time Calculation (min) 43 min    Activity Tolerance Patient tolerated treatment well    Behavior During Therapy [facility] for tasks assessed/performed           Past Medical History:  Diagnosis Date  . Abdominal pain, left lower quadrant 11/09/2009   Qualifier: Diagnosis of  By: Valma Cava LPN, Izora Gala    . Allergic rhinitis 08/26/2008   Qualifier: Diagnosis of  By: Sherren Mocha MD, Jory Ee   . Anemia, iron deficiency 11/21/2018  . Arthritis shoulders and back  . BACK PAIN, CHRONIC 06/13/2007   Qualifier: Diagnosis of  By: Sherren Mocha MD, Jory Ee   . 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 Complication of anesthesia    Pt states having some "difficulty waking up"  . Constipation 07/01/2018  . Costochondritis 05/04/2014  . Diverticulitis of colon 06/13/2007   S/p segmental colectomy for perforated diverticulitis   . Diverticulosis   . Facial pain 10/06/2015  . Fibromyalgia   . Foot sprain, left, initial encounter 08/31/2016  . Glossitis 02/02/2016  . Headache 04/24/2016  . HEMATURIA UNSPECIFIED 06/25/2008   Qualifier: Diagnosis of  By: Sherren Mocha MD, Jory Ee   . HEMATURIA, HX OF 11/14/2009   Qualifier: Diagnosis of  By: Regino Schultze CMA (AAMA), Apolonio Schneiders    . History of kidney stones   . History of kidney stones   . History of shingles 2012--  no residual pain  . Itching 10/13/2018  . Left eye pain 07/01/2018  . Left  ureteral calculus   . LLQ pain 04/24/2016  . LOC OSTEOARTHROS NOT SPEC PRIM/SEC OTH [facility] SITE 06/27/2009   Qualifier: Diagnosis of  By: Sherren Mocha MD, Jory Ee   . Malignant neoplasm of upper-outer quadrant of right breast in female, estrogen receptor positive ([place]) 11/17/2018  . MCI (mild cognitive impairment)   . Memory loss 12/21/2016  . Morbid obesity ([place]) 11/09/2010  . Pain of right side of body 08/31/2016  . Personal history of chemotherapy   . Personal history of radiation therapy   . Pruritus 10/26/2016  . Renal disorder   . Right sided temporal headache 12/21/2016  . Tremor 12/21/2016  . Trigeminal neuralgia RIGHT  . Trigeminal neuralgia of right side of face 11/30/2010  . Trochanteric bursitis, right hip 10/26/2016  . Urgency of urination   . Weight loss 07/22/2017  . Zoster without complications 12/11/4740    Past Surgical History:  Procedure Laterality Date  . BREAST BIOPSY Right 11/11/2018  . BREAST LUMPECTOMY Right 11/28/2018  . BREAST LUMPECTOMY WITH RADIOACTIVE SEED AND SENTINEL LYMPH NODE BIOPSY Right 11/28/2018   Procedure: RIGHT BREAST LUMPECTOMY WITH RADIOACTIVE SEED AND RIGHT AXILLARY SENTINEL LYMPH NODE BIOPSY;  Surgeon: Excell Seltzer, MD;  Location: [place];  Service: General;  Laterality: Right;  . CEREBRAL MICROVASCULAR DECOMPRESSION  07-05-2006  RIGHT TRIGEMINAL NERVE  . CYSTOSCOPY/RETROGRADE/URETEROSCOPY/STONE EXTRACTION WITH BASKET  07/04/2012   Procedure: CYSTOSCOPY/RETROGRADE/URETEROSCOPY/STONE EXTRACTION WITH BASKET;  Surgeon: Claybon Jabs, MD;  Location: [facility];  Service: Urology;  Laterality: Left;  . EXPLORATORY LAPAROTOMY/ RESECTION MID TO DISTAL SIGMOID AND PROXIMAL RECTUM/ END PROXIMAL SIGMOID COLOSTOMY  07-17-2006   PERFORATED DIVERTICULITIS WITH PERITONITIS  . gamma knife  05/17/2016   for trigeminal neuralgia, [facility], Dr Salomon Fick  . gamma knife  2019  . IR CV LINE INJECTION  10/27/2019  . PORTACATH PLACEMENT Left 11/28/2018    Procedure: INSERTION PORT-A-CATH WITH ULTRASOUND;  Surgeon: Excell Seltzer, MD;  Location: [place];  Service: General;  Laterality: Left;  . portacath removal    . RESECTION COLOSTOMY/ CLOSURE COLOSTOMY WITH COLOPROCTOSTOMY  02-20-2007  . TONSILLECTOMY  AS CHILD  . TOTAL KNEE ARTHROPLASTY Left 06/20/2020   Procedure: LEFT TOTAL KNEE ARTHROPLASTY;  Surgeon: Leandrew Koyanagi, MD;  Location: [place];  Service: Orthopedics;  Laterality: Left;  . URETEROSCOPY  07/04/2012   Procedure: URETEROSCOPY;  Surgeon: Claybon Jabs, MD;  Location: [facility];  Service: Urology;  Laterality: Left;  Marland Kitchen VAGINAL HYSTERECTOMY  1998   Partial  . WISDOM TOOTH EXTRACTION      There were no vitals filed for this visit.   Subjective Assessment - 08/11/20 0954    Subjective Pt. indicated feeling pretty good today, no specific pain upon arrival.    Limitations Sitting;Lifting;Standing;Walking;House hold activities    Patient Stated Goals Get back to walking, gardening, cleaning house, walking independent    Currently in Pain? No/denies                             [facility] Adult PT Treatment/Exercise - 08/11/20 0001      Exercises   Other Exercises  Additional time spent in intervention for slow control movement pattern, requiring additional time and reduced activity number today      Knee/Hip Exercises: Stretches   Gastroc Stretch 30 seconds;5 reps;Left   runner stretch on incline board     Knee/Hip Exercises: Aerobic   Other Aerobic UBE LE lvl 4 12 mins      Knee/Hip Exercises: Machines for Strengthening   Cybex Knee Extension Eccentric Lt LE 3 x 10 15 lbs    Cybex Knee Flexion SL 3 x 10 15 lbs 3 x 10      Knee/Hip Exercises: Standing   Lateral Step Up Step Height: 6";10 reps;2 sets;Left                    PT Short Term Goals - 07/22/20 1021      PT SHORT TERM GOAL #1   Title Patient will demonstrate independent use of home exercise program to maintain  progress from in clinic treatments.    Time 3    Period Weeks    Status On-going    Target Date 08/01/20             PT Long Term Goals - 07/22/20 1022      PT LONG TERM GOAL #1   Title Patient will demonstrate/report pain at worst less than or equal to 2/10 to facilitate minimal limitation in daily activity secondary to pain symptoms.    Time 10    Period Weeks    Status On-going      PT LONG TERM GOAL #2   Title Patient will demonstrate independent use of  home exercise program to facilitate ability to maintain/progress functional gains from skilled physical therapy services.    Time 10    Period Weeks    Status On-going      PT LONG TERM GOAL #3   Title Pt. will demonstrate Lt knee AROM 0-110 deg to facilitate ability to perform walking, stairs, squat, transfers at PLOF s limitation.    Time 10    Period Weeks    Status On-going      PT LONG TERM GOAL #4   Title Pt. will demonstrate Lt LE MMT 5/5 throughout to facilitate reciprocal gait on stairs, independent ambulation.    Time 10    Period Weeks    Status On-going      PT LONG TERM GOAL #5   Title Pt. will demonstrate Lt SLS > 15 seconds to facilitate stability on uneven surfaces ambulation independent.    Time 10    Period Weeks    Status On-going      PT LONG TERM GOAL #6   Title Pt. will demonstrate independent ambulation at PLOF community distances > 300 ft.    Time 10    Period Weeks    Status On-going                 Plan - 08/11/20 1023    Clinical Impression Statement Advised Pt. to schedule for transitioning to 1x/week in next month to improve HEP performance while continued skilled PT performed to improve balance control and WB strength for improved progressive mobility function.    Personal Factors and Comorbidities Comorbidity 2    Comorbidities Fibromyalgia, obesity    Examination-Activity Limitations Bathing;Sit;Sleep;Bed Mobility;Bend;Squat;Stairs;Carry;Stand;Transfers;Lift;Locomotion  Level    Examination-Participation Restrictions Yard Work;Cleaning;Community Activity    Stability/Clinical Decision Making Stable/Uncomplicated    Rehab Potential Good    PT Frequency Other (comment)    PT Treatment/Interventions ADLs/Self Care Home Management;Cryotherapy;Electrical Stimulation;Iontophoresis 4mg /ml Dexamethasone;Moist Heat;Balance training;Therapeutic exercise;Therapeutic activities;Functional mobility training;Stair training;Gait training;Ultrasound;Neuromuscular re-education;Patient/family education;Manual techniques;Vasopneumatic Device;Taping;Dry needling;Passive range of motion;Joint Manipulations    PT Next Visit Plan Extension end range, WB strengthening, balance intervention progression.    PT Home Exercise Plan [email or web address]    Consulted and Agree with Plan of Care Patient           Patient will benefit from skilled therapeutic intervention in order to improve the following deficits and impairments:  Abnormal gait, Decreased endurance, Hypomobility, Increased edema, Decreased activity tolerance, Decreased strength, Pain, Increased muscle spasms, Difficulty walking, Decreased mobility, Decreased balance, Decreased range of motion, Improper body mechanics, Impaired perceived functional ability, Impaired flexibility, Decreased coordination  Visit Diagnosis: Chronic pain of left knee  Muscle weakness (generalized)  Stiffness of left knee, not elsewhere classified  Difficulty in walking, not elsewhere classified  Localized edema     Problem List Patient Active Problem List   Diagnosis Date Noted  . Chronic left-sided low back pain with left-sided sciatica 08/10/2020  . Left hip pain 08/10/2020  . Status post total left knee replacement 06/20/2020  . Primary osteoarthritis of left knee 06/19/2020  . Left knee pain 02/26/2020  . Bilateral arm numbness and tingling while sleeping 02/26/2020  . COVID-19 11/06/2019  . Muscle cramps 05/12/2019  . Cramps, muscle,  general 04/23/2019  . Tinnitus 04/16/2019  . Chest pain of uncertain etiology 43/15/4008  . Bilateral leg edema 02/10/2019  . Port-A-Cath in place 01/30/2019  . Anemia, iron deficiency 11/21/2018  . Malignant neoplasm of upper-outer quadrant of right breast in female, estrogen receptor positive (  [place]) 11/17/2018  . Itching 10/13/2018  . Left eye pain 07/01/2018  . Constipation 07/01/2018  . Weight loss 07/22/2017  . Memory loss 12/21/2016  . Tremor 12/21/2016  . Right sided temporal headache 12/21/2016  . Trochanteric bursitis, right hip 10/26/2016  . Pruritus 10/26/2016  . Foot sprain, left, initial encounter 08/31/2016  . Pain of right side of body 08/31/2016  . Headache 04/24/2016  . LLQ pain 04/24/2016  . Glossitis 02/02/2016  . Routine general medical examination at a health care facility 10/27/2015  . Facial pain 10/06/2015  . Trigeminal neuralgia of right side of face 11/30/2010  . Morbid obesity ([place]) 11/09/2010  . Fibromyalgia 11/09/2010  . HEMATURIA, HX OF 11/14/2009  . LOC OSTEOARTHROS NOT SPEC PRIM/SEC OTH [facility] SITE 06/27/2009  . Allergic rhinitis 08/26/2008  . 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 [place], [place], [address] Phone: [phone]   Fax:  [phone]  Name: Summer [name] MRN: [id number] Date of Birth: [birth year]

## 2020-08-12 ENCOUNTER — Inpatient Hospital Stay: Payer: Federal, State, Local not specified - PPO | Admitting: Hematology

## 2020-08-12 ENCOUNTER — Inpatient Hospital Stay: Payer: Federal, State, Local not specified - PPO

## 2020-08-12 DIAGNOSIS — C50411 Malignant neoplasm of upper-outer quadrant of right female breast: Secondary | ICD-10-CM

## 2020-08-15 ENCOUNTER — Encounter: Payer: Self-pay | Admitting: Rehabilitative and Restorative Service Providers"

## 2020-08-15 ENCOUNTER — Inpatient Hospital Stay: Payer: Federal, State, Local not specified - PPO | Attending: Hematology

## 2020-08-15 ENCOUNTER — Other Ambulatory Visit: Payer: Self-pay

## 2020-08-15 ENCOUNTER — Ambulatory Visit (INDEPENDENT_AMBULATORY_CARE_PROVIDER_SITE_OTHER): Payer: Federal, State, Local not specified - PPO | Admitting: Rehabilitative and Restorative Service Providers"

## 2020-08-15 ENCOUNTER — Inpatient Hospital Stay (HOSPITAL_BASED_OUTPATIENT_CLINIC_OR_DEPARTMENT_OTHER): Payer: Federal, State, Local not specified - PPO | Admitting: Hematology

## 2020-08-15 VITALS — BP 145/75 | HR 76 | Temp 97.6°F | Resp 13 | Ht 66.0 in | Wt 182.7 lb

## 2020-08-15 DIAGNOSIS — Z170 Estrogen receptor positive status [ER+]: Secondary | ICD-10-CM

## 2020-08-15 DIAGNOSIS — M1990 Unspecified osteoarthritis, unspecified site: Secondary | ICD-10-CM | POA: Diagnosis not present

## 2020-08-15 DIAGNOSIS — C50411 Malignant neoplasm of upper-outer quadrant of right female breast: Secondary | ICD-10-CM | POA: Insufficient documentation

## 2020-08-15 DIAGNOSIS — G8929 Other chronic pain: Secondary | ICD-10-CM

## 2020-08-15 DIAGNOSIS — M85852 Other specified disorders of bone density and structure, left thigh: Secondary | ICD-10-CM | POA: Insufficient documentation

## 2020-08-15 DIAGNOSIS — Z9221 Personal history of antineoplastic chemotherapy: Secondary | ICD-10-CM | POA: Diagnosis not present

## 2020-08-15 DIAGNOSIS — Z7901 Long term (current) use of anticoagulants: Secondary | ICD-10-CM | POA: Diagnosis not present

## 2020-08-15 DIAGNOSIS — Z79811 Long term (current) use of aromatase inhibitors: Secondary | ICD-10-CM | POA: Diagnosis not present

## 2020-08-15 DIAGNOSIS — G629 Polyneuropathy, unspecified: Secondary | ICD-10-CM | POA: Diagnosis not present

## 2020-08-15 DIAGNOSIS — R262 Difficulty in walking, not elsewhere classified: Secondary | ICD-10-CM | POA: Diagnosis not present

## 2020-08-15 DIAGNOSIS — M6281 Muscle weakness (generalized): Secondary | ICD-10-CM | POA: Diagnosis not present

## 2020-08-15 DIAGNOSIS — Z79899 Other long term (current) drug therapy: Secondary | ICD-10-CM | POA: Diagnosis not present

## 2020-08-15 DIAGNOSIS — G500 Trigeminal neuralgia: Secondary | ICD-10-CM | POA: Diagnosis not present

## 2020-08-15 DIAGNOSIS — M797 Fibromyalgia: Secondary | ICD-10-CM | POA: Diagnosis not present

## 2020-08-15 DIAGNOSIS — D509 Iron deficiency anemia, unspecified: Secondary | ICD-10-CM

## 2020-08-15 DIAGNOSIS — M25662 Stiffness of left knee, not elsewhere classified: Secondary | ICD-10-CM

## 2020-08-15 DIAGNOSIS — R600 Localized edema: Secondary | ICD-10-CM

## 2020-08-15 DIAGNOSIS — Z923 Personal history of irradiation: Secondary | ICD-10-CM | POA: Insufficient documentation

## 2020-08-15 DIAGNOSIS — M25562 Pain in left knee: Secondary | ICD-10-CM

## 2020-08-15 LAB — CBC WITH DIFFERENTIAL (CANCER CENTER ONLY)
Abs Immature Granulocytes: 0.01 10*3/uL (ref 0.00–0.07)
Basophils Absolute: 0 10*3/uL (ref 0.0–0.1)
Basophils Relative: 1 %
Eosinophils Absolute: 0.1 10*3/uL (ref 0.0–0.5)
Eosinophils Relative: 2 %
HCT: 39.3 % (ref 36.0–46.0)
Hemoglobin: 12.5 g/dL (ref 12.0–15.0)
Immature Granulocytes: 0 %
Lymphocytes Relative: 36 %
Lymphs Abs: 1.7 10*3/uL (ref 0.7–4.0)
MCH: 28.9 pg (ref 26.0–34.0)
MCHC: 31.8 g/dL (ref 30.0–36.0)
MCV: 90.8 fL (ref 80.0–100.0)
Monocytes Absolute: 0.4 10*3/uL (ref 0.1–1.0)
Monocytes Relative: 9 %
Neutro Abs: 2.6 10*3/uL (ref 1.7–7.7)
Neutrophils Relative %: 52 %
Platelet Count: 281 10*3/uL (ref 150–400)
RBC: 4.33 MIL/uL (ref 3.87–5.11)
RDW: 12.9 % (ref 11.5–15.5)
WBC Count: 4.9 10*3/uL (ref 4.0–10.5)
nRBC: 0 % (ref 0.0–0.2)

## 2020-08-15 LAB — CMP (CANCER CENTER ONLY)
ALT: 9 U/L (ref 0–44)
AST: 16 U/L (ref 15–41)
Albumin: 3.5 g/dL (ref 3.5–5.0)
Alkaline Phosphatase: 136 U/L — ABNORMAL HIGH (ref 38–126)
Anion gap: 7 (ref 5–15)
BUN: 7 mg/dL — ABNORMAL LOW (ref 8–23)
CO2: 31 mmol/L (ref 22–32)
Calcium: 9.5 mg/dL (ref 8.9–10.3)
Chloride: 101 mmol/L (ref 98–111)
Creatinine: 0.74 mg/dL (ref 0.44–1.00)
GFR, Estimated: >60 mL/min (ref >60)
Glucose, Bld: 83 mg/dL (ref 70–99)
Potassium: 3.6 mmol/L (ref 3.5–5.1)
Sodium: 139 mmol/L (ref 135–145)
Total Bilirubin: 0.6 mg/dL (ref 0.3–1.2)
Total Protein: 7.2 g/dL (ref 6.5–8.1)

## 2020-08-15 LAB — IRON AND TIBC
Iron: 51 ug/dL (ref 41–142)
Saturation Ratios: 15 % — ABNORMAL LOW (ref 21–57)
TIBC: 344 ug/dL (ref 236–444)
UIBC: 293 ug/dL (ref 120–384)

## 2020-08-15 LAB — FERRITIN: Ferritin: 34 ng/mL (ref 11–307)

## 2020-08-15 NOTE — Therapy (Signed)
[place], [place], [address] Phone: [phone]   Fax:  [phone]  Physical Therapy Treatment/Progress Note  Patient Details  Name: Estefani Bateson MRN: [id number] Date of Birth: 03-04-1951 Referring Provider (PT): Dr. Erlinda Hong   Encounter Date: 08/15/2020  Progress Note Reporting Period 07/11/2020 to 08/15/2020  See note below for Objective Data and Assessment of Progress/Goals.       PT End of Session - 08/15/20 1106    Visit Number 10    Number of Visits 16    Date for PT Re-Evaluation 09/19/20    Authorization Type [place], Medicare    Progress Note Due on Visit 16    PT Start Time 1015    PT Stop Time 1100    PT Time Calculation (min) 45 min    Activity Tolerance Patient tolerated treatment well    Behavior During Therapy WFL for tasks assessed/performed           Past Medical History:  Diagnosis Date  . Abdominal pain, left lower quadrant 11/09/2009   Qualifier: Diagnosis of  By: Valma Cava LPN, Izora Gala    . Allergic rhinitis 08/26/2008   Qualifier: Diagnosis of  By: Sherren Mocha MD, Jory Ee   . Anemia, iron deficiency 11/21/2018  . Arthritis shoulders and back  . BACK PAIN, CHRONIC 06/13/2007   Qualifier: Diagnosis of  By: Sherren Mocha MD, Jory Ee   . 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 Complication of anesthesia    Pt states having some "difficulty waking up"  . Constipation 07/01/2018  . Costochondritis 05/04/2014  . Diverticulitis of colon 06/13/2007   S/p segmental colectomy for perforated diverticulitis   . Diverticulosis   . Facial pain 10/06/2015  . Fibromyalgia   . Foot sprain, left, initial encounter 08/31/2016  . Glossitis 02/02/2016  . Headache 04/24/2016  . HEMATURIA UNSPECIFIED 06/25/2008   Qualifier: Diagnosis of  By: Sherren Mocha MD, Jory Ee   . HEMATURIA, HX OF 11/14/2009   Qualifier: Diagnosis of  By: Regino Schultze CMA (AAMA), Apolonio Schneiders    . History of  kidney stones   . History of kidney stones   . History of shingles 2012--  no residual pain  . Itching 10/13/2018  . Left eye pain 07/01/2018  . Left ureteral calculus   . LLQ pain 04/24/2016  . LOC OSTEOARTHROS NOT SPEC PRIM/SEC OTH [facility] SITE 06/27/2009   Qualifier: Diagnosis of  By: Sherren Mocha MD, Jory Ee   . Malignant neoplasm of upper-outer quadrant of right breast in female, estrogen receptor positive ([place]) 11/17/2018  . MCI (mild cognitive impairment)   . Memory loss 12/21/2016  . Morbid obesity ([place]) 11/09/2010  . Pain of right side of body 08/31/2016  . Personal history of chemotherapy   . Personal history of radiation therapy   . Pruritus 10/26/2016  . Renal disorder   . Right sided temporal headache 12/21/2016  . Tremor 12/21/2016  . Trigeminal neuralgia RIGHT  . Trigeminal neuralgia of right side of face 11/30/2010  . Trochanteric bursitis, right hip 10/26/2016  . Urgency of urination   . Weight loss 07/22/2017  . Zoster without complications 2/68/3419    Past Surgical History:  Procedure Laterality Date  . BREAST BIOPSY Right 11/11/2018  . BREAST LUMPECTOMY Right 11/28/2018  . BREAST LUMPECTOMY WITH RADIOACTIVE SEED AND SENTINEL LYMPH NODE BIOPSY Right 11/28/2018   Procedure: RIGHT BREAST LUMPECTOMY WITH RADIOACTIVE SEED AND RIGHT AXILLARY SENTINEL LYMPH NODE  BIOPSY;  Surgeon: Excell Seltzer, MD;  Location: [place];  Service: General;  Laterality: Right;  . CEREBRAL MICROVASCULAR DECOMPRESSION  07-05-2006   RIGHT TRIGEMINAL NERVE  . CYSTOSCOPY/RETROGRADE/URETEROSCOPY/STONE EXTRACTION WITH BASKET  07/04/2012   Procedure: CYSTOSCOPY/RETROGRADE/URETEROSCOPY/STONE EXTRACTION WITH BASKET;  Surgeon: Claybon Jabs, MD;  Location: [facility];  Service: Urology;  Laterality: Left;  . EXPLORATORY LAPAROTOMY/ RESECTION MID TO DISTAL SIGMOID AND PROXIMAL RECTUM/ END PROXIMAL SIGMOID COLOSTOMY  07-17-2006   PERFORATED DIVERTICULITIS WITH PERITONITIS  . gamma knife  05/17/2016     for trigeminal neuralgia, [facility], Dr Salomon Fick  . gamma knife  2019  . IR CV LINE INJECTION  10/27/2019  . PORTACATH PLACEMENT Left 11/28/2018   Procedure: INSERTION PORT-A-CATH WITH ULTRASOUND;  Surgeon: Excell Seltzer, MD;  Location: [place];  Service: General;  Laterality: Left;  . portacath removal    . RESECTION COLOSTOMY/ CLOSURE COLOSTOMY WITH COLOPROCTOSTOMY  02-20-2007  . TONSILLECTOMY  AS CHILD  . TOTAL KNEE ARTHROPLASTY Left 06/20/2020   Procedure: LEFT TOTAL KNEE ARTHROPLASTY;  Surgeon: Leandrew Koyanagi, MD;  Location: [place];  Service: Orthopedics;  Laterality: Left;  . URETEROSCOPY  07/04/2012   Procedure: URETEROSCOPY;  Surgeon: Claybon Jabs, MD;  Location: [facility];  Service: Urology;  Laterality: Left;  Marland Kitchen VAGINAL HYSTERECTOMY  1998   Partial  . WISDOM TOOTH EXTRACTION      There were no vitals filed for this visit.   Subjective Assessment - 08/15/20 1103    Subjective Pt. stated some difficulty getting Lt foot up to put on shoes.  Overall no real pain upon arrival reported.    Limitations Sitting;Lifting;Standing;Walking;House hold activities    Patient Stated Goals Get back to walking, gardening, cleaning house, walking independent    Currently in Pain? No/denies              [facility] PT Assessment - 08/15/20 0001      Assessment   Medical Diagnosis Lt TKA     Referring Provider (PT) Dr. Erlinda Hong    Onset Date/Surgical Date 06/20/20      AROM   Left Knee Extension -6    Left Knee Flexion 106      Strength   Left Knee Flexion 5/5    Left Knee Extension 4+/5      Ambulation/Gait   Ambulation/Gait Yes    Ambulation/Gait Assistance 7: Independent    Gait Pattern Step-through pattern;Decreased step length - right   Reduced stance in Lt LE, maintained knee flexion in stance o                        [facility] Adult PT Treatment/Exercise - 08/15/20 0001      Knee/Hip Exercises: Stretches   Gastroc Stretch 3 reps;Both;30 seconds    incline board   Other Knee/Hip Stretches seated Figure 4 stretch 15 sec x 2 c instruction for home      Knee/Hip Exercises: Aerobic   Recumbent Bike seat 5 lvl 3 10 min      Knee/Hip Exercises: Machines for Strengthening   Cybex Knee Extension Eccentric Lt LE 3 x 10 15 lbs    Cybex Knee Flexion SL 3 x 10 15 lbs 3 x 10    Total Gym Leg Press SL 3 x 10 56 lbs, performed bilateral      Knee/Hip Exercises: Standing   Forward Step Up Step Height: 8";20 reps;Both  PT Short Term Goals - 07/22/20 1021      PT SHORT TERM GOAL #1   Title Patient will demonstrate independent use of home exercise program to maintain progress from in clinic treatments.    Time 3    Period Weeks    Status On-going    Target Date 08/01/20             PT Long Term Goals - 08/15/20 1114      PT LONG TERM GOAL #1   Title Patient will demonstrate/report pain at worst less than or equal to 2/10 to facilitate minimal limitation in daily activity secondary to pain symptoms.    Time 10    Period Weeks    Status On-going    Target Date 09/19/20      PT LONG TERM GOAL #2   Title Patient will demonstrate independent use of home exercise program to facilitate ability to maintain/progress functional gains from skilled physical therapy services.    Time 10    Period Weeks    Status On-going    Target Date 09/19/20      PT LONG TERM GOAL #3   Title Pt. will demonstrate Lt knee AROM 0-110 deg to facilitate ability to perform walking, stairs, squat, transfers at PLOF s limitation.    Time 10    Period Weeks    Status On-going    Target Date 09/19/20      PT LONG TERM GOAL #4   Title Pt. will demonstrate Lt LE MMT 5/5 throughout to facilitate reciprocal gait on stairs, independent ambulation.    Time 10    Period Weeks    Status Partially Met    Target Date 09/19/20      PT LONG TERM GOAL #5   Title Pt. will demonstrate Lt SLS > 15 seconds to facilitate stability on uneven  surfaces ambulation independent.    Time 10    Period Weeks    Status On-going      PT LONG TERM GOAL #6   Title Pt. will demonstrate independent ambulation at PLOF community distances > 300 ft.    Time 10    Period Weeks    Status Achieved                 Plan - 08/15/20 1104    Clinical Impression Statement Pt. has attended 10 visits overall during course of treatment, showing gains in all objective data and reporting improved function in daily activiyt.  Pt. does continue to present c Lt knee impairments including Lt knee flexion and Lt hip mobility limiting in FABER movement for don/doff shoes.  Progressed difficulty c WB stair and strengthening plan with good overall response.  Pt. may continue to benefit from strength gains.    Personal Factors and Comorbidities Comorbidity 2    Comorbidities Fibromyalgia, obesity    Examination-Activity Limitations Bathing;Sit;Sleep;Bed Mobility;Bend;Squat;Stairs;Carry;Stand;Transfers;Lift;Locomotion Level    Examination-Participation Restrictions Yard Work;Cleaning;Community Activity    Stability/Clinical Decision Making Stable/Uncomplicated    Rehab Potential Good    PT Frequency Other (comment)    PT Treatment/Interventions ADLs/Self Care Home Management;Cryotherapy;Electrical Stimulation;Iontophoresis 37m/ml Dexamethasone;Moist Heat;Balance training;Therapeutic exercise;Therapeutic activities;Functional mobility training;Stair training;Gait training;Ultrasound;Neuromuscular re-education;Patient/family education;Manual techniques;Vasopneumatic Device;Taping;Dry needling;Passive range of motion;Joint Manipulations    PT Next Visit Plan FOTO next visit    PT Home Exercise Plan [id number]    Consulted and Agree with Plan of Care Patient           Patient will benefit from skilled therapeutic  intervention in order to improve the following deficits and impairments:  Abnormal gait, Decreased endurance, Hypomobility, Increased edema, Decreased  activity tolerance, Decreased strength, Pain, Increased muscle spasms, Difficulty walking, Decreased mobility, Decreased balance, Decreased range of motion, Improper body mechanics, Impaired perceived functional ability, Impaired flexibility, Decreased coordination  Visit Diagnosis: Chronic pain of left knee  Muscle weakness (generalized)  Stiffness of left knee, not elsewhere classified  Difficulty in walking, not elsewhere classified  Localized edema     Problem List Patient Active Problem List   Diagnosis Date Noted  . Chronic left-sided low back pain with left-sided sciatica 08/10/2020  . Left hip pain 08/10/2020  . Status post total left knee replacement 06/20/2020  . Primary osteoarthritis of left knee 06/19/2020  . Left knee pain 02/26/2020  . Bilateral arm numbness and tingling while sleeping 02/26/2020  . COVID-19 11/06/2019  . Muscle cramps 05/12/2019  . Cramps, muscle, general 04/23/2019  . Tinnitus 04/16/2019  . Chest pain of uncertain etiology 99/83/3825  . Bilateral leg edema 02/10/2019  . Port-A-Cath in place 01/30/2019  . Anemia, iron deficiency 11/21/2018  . Malignant neoplasm of upper-outer quadrant of right breast in female, estrogen receptor positive ([place]) 11/17/2018  . Itching 10/13/2018  . Left eye pain 07/01/2018  . Constipation 07/01/2018  . Weight loss 07/22/2017  . Memory loss 12/21/2016  . Tremor 12/21/2016  . Right sided temporal headache 12/21/2016  . Trochanteric bursitis, right hip 10/26/2016  . Pruritus 10/26/2016  . Foot sprain, left, initial encounter 08/31/2016  . Pain of right side of body 08/31/2016  . Headache 04/24/2016  . LLQ pain 04/24/2016  . Glossitis 02/02/2016  . Routine general medical examination at a health care facility 10/27/2015  . Facial pain 10/06/2015  . Trigeminal neuralgia of right side of face 11/30/2010  . Morbid obesity ([place]) 11/09/2010  . Fibromyalgia 11/09/2010  . HEMATURIA, HX OF 11/14/2009  . LOC  OSTEOARTHROS NOT SPEC PRIM/SEC OTH [facility] SITE 06/27/2009  . Allergic rhinitis 08/26/2008  . 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 [place], [place], [address] Phone: [phone]   Fax:  [phone]  Name: Keyera Hattabaugh MRN: [id number] Date of Birth: Jan 18, 1951

## 2020-08-15 NOTE — Progress Notes (Signed)
[place]   [phone] [phone]   Clinic Follow up Note   Patient Care Team: Hoyt Koch, MD as PCP - General (Internal Medicine) Sueanne Margarita, MD as PCP - Cardiology (Cardiology) Mauro Kaufmann, RN as Oncology Nurse Navigator Rockwell Germany, RN as Oncology Nurse Navigator Hoxworth, Marland Kitchen, MD (Inactive) as Consulting Physician (General Surgery) Truitt Merle, MD as Consulting Physician (Hematology) Gery Pray, MD as Consulting Physician (Radiation Oncology) Alla Feeling, NP as Nurse Practitioner (Nurse Practitioner)  Date of Service:  08/15/2020  CHIEF COMPLAINT: F/u of right breast cancer  SUMMARY OF ONCOLOGIC HISTORY: Oncology History Overview Note  Cancer Staging Malignant neoplasm of upper-outer quadrant of right breast in female, estrogen receptor positive ([place]) Staging form: Breast, AJCC 8th Edition - Clinical stage from 11/10/2018: Stage IA (cT1b, cN0, cM0, G2, ER+, PR+, HER2+) - Signed by Truitt Merle, MD on 11/18/2018     Malignant neoplasm of upper-outer quadrant of right breast in female, estrogen receptor positive ([place])  11/07/2018 Mammogram   Diagnostic 11/07/18 IMPRESSION: 1. 1 x 0.7 x 0.6 cm hypoechoic mass with distortion at the 9-9:30 position of the RIGHT breast 6 cm from the nipple with distortion in the UPPER-OUTER RIGHT breast, corresponding to the mammographic abnormality. One RIGHT axillary lymph node with borderline cortical thickness. Tissue sampling of the RIGHT breast mass and borderline RIGHT axillary lymph node recommended. ADDENDUM: Patient returned today for biopsy of a single borderline thickened lymph node in the RIGHT axilla. I could not reproduce a morphologically abnormal lymph node today, possibly resolved in the interval. Today, only normal appearing lymph nodes were identified in the RIGHT axilla. As such, the axillary ultrasound-guided biopsy was canceled.   11/10/2018 Cancer  Staging   Staging form: Breast, AJCC 8th Edition - Clinical stage from 11/10/2018: Stage IA (cT1b, cN0, cM0, G2, ER+, PR+, HER2+) - Signed by Truitt Merle, MD on 11/18/2018   11/11/2018 Initial Biopsy   Diagnosis 11/11/18 Breast, right, needle core biopsy, upper outer - 9:30 o'clock position - INVASIVE DUCTAL CARCINOMA, GRADE II/III. - SEE MICROSCOPIC DESCRIPTION.   11/11/2018 Receptors her2   Results: IMMUNOHISTOCHEMICAL AND MORPHOMETRIC ANALYSIS PERFORMED MANUALLY The tumor cells are POSITIVE for Her2 (3+). Estrogen Receptor: 95%, POSITIVE, STRONG STAINING INTENSITY Progesterone Receptor: 5%, POSITIVE, STRONG STAINING INTENSITY Proliferation Marker Ki67: 20%   11/17/2018 Initial Diagnosis   Malignant neoplasm of upper-outer quadrant of right breast in female, estrogen receptor positive ([place])   11/28/2018 Surgery   RIGHT BREAST LUMPECTOMY WITH RADIOACTIVE SEED AND RIGHT AXILLARY SENTINEL LYMPH NODE BIOPSY by Dr. Excell Seltzer  11/28/18    11/28/2018 Pathology Results   Diagnosis 11/28/18  1. Breast, lumpectomy, Right w/seed - INVASIVE DUCTAL CARCINOMA, 1.6 CM, [place]TINGHAM GRADE 2 OF 3. - MARGINS OF RESECTION ARE NOT INVOLVED (CLOSEST MARGIN: LESS THAN 1 MM, ANTERIOR). - DUCTAL CARCINOMA IN SITU. - BIOPSY SITE CHANGES. - SEE ONCOLOGY TABLE. 2. Lymph node, sentinel, biopsy, right Axillary - ONE LYMPH NODE, NEGATIVE FOR CARCINOMA (0/1). 3. Lymph node, sentinel, biopsy, right Axillary - ONE LYMPH NODE, NEGATIVE FOR CARCINOMA (0/1).   11/28/2018 Cancer Staging   Staging form: Breast, AJCC 8th Edition - Pathologic stage from 11/28/2018: Stage IA (pT1c, pN0, cM0, G2, ER+, PR+, HER2+) - Signed by Truitt Merle, MD on 12/11/2018   12/22/2018 Procedure   Baseline ECHO 12/22/18  IMPRESSIONS  1. The left ventricle has normal systolic function with an ejection fraction of 60-65%. The cavity size was normal. There is mild concentric left  ventricular hypertrophy. Left ventricular diastolic parameters were normal.  2.  The right ventricle has normal systolic function. The cavity was normal. There is no increase in right ventricular wall thickness.  3. Left atrial size was moderately dilated.  4. The aortic valve is tricuspid. Aortic valve regurgitation was not assessed by color flow Doppler.    01/02/2019 - 02/12/2020 Chemotherapy   Adjuvant chemo weekly Taxol with Herceptin starting 01/02/19. Will d/c after 02/13/19 and will switch to Kadcyla q3weeks starting 02/20/19 due to worsening neuropathy. Due to worsening Muscle cramps Kadcyla stopped 04/03/19 and switched to Herceptin q3weeks on 04/23/19. Completed on 02/12/20   04/09/2019 - 05/28/2019 Radiation Therapy   RT with Dr. Sondra Come 04/09/19-05/28/19   06/29/2019 -  Anti-estrogen oral therapy   Exemestane 11m daily starting 06/29/19   04/28/2020 Survivorship   SCP delivered by LCira Rue NP      CURRENT THERAPY:  -IV iron as needed -Exemestane 261mdaily starting 06/29/19  INTERVAL HISTORY:  CaSabreena [name] here for a follow up of breast cancer. She presents to the clinic alone.  She has left knee suyrgery on June 20, 2020, she is recovering well.  She is still on physical therapy, but able to walk independently.  She is overall feeling much better.  She has been tolerating exemestane well, no hot flashes, no other new arthralgia.  Her neuropathy on the left leg has overall improved some over time.  Her appetite and energy level are decent, she denies any other new pain, pulmonary or GI symptoms.  All other systems were reviewed with the patient and are negative.  MEDICAL HISTORY:  Past Medical History:  Diagnosis Date  . Abdominal pain, left lower quadrant 11/09/2009   Qualifier: Diagnosis of  By: NeValma CavaPN, NaIzora Gala  . Allergic rhinitis 08/26/2008   Qualifier: Diagnosis of  By: ToSherren MochaD, JeJory Ee . Anemia, iron deficiency 11/21/2018  . Arthritis shoulders and back  . BACK PAIN, CHRONIC 06/13/2007   Qualifier: Diagnosis of  By: ToSherren MochaD,  JeJory Ee . 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 Complication of anesthesia    Pt states having some "difficulty waking up"  . Constipation 07/01/2018  . Costochondritis 05/04/2014  . Diverticulitis of colon 06/13/2007   S/p segmental colectomy for perforated diverticulitis   . Diverticulosis   . Facial pain 10/06/2015  . Fibromyalgia   . Foot sprain, left, initial encounter 08/31/2016  . Glossitis 02/02/2016  . Headache 04/24/2016  . HEMATURIA UNSPECIFIED 06/25/2008   Qualifier: Diagnosis of  By: ToSherren MochaD, JeJory Ee . HEMATURIA, HX OF 11/14/2009   Qualifier: Diagnosis of  By: VeRegino SchultzeMA (AAMA), RaApolonio Schneiders  . History of kidney stones   . History of kidney stones   . History of shingles 2012--  no residual pain  . Itching 10/13/2018  . Left eye pain 07/01/2018  . Left ureteral calculus   . LLQ pain 04/24/2016  . LOC O[facility]ARTHROS NOT SPEC PRIM/[facility] 06/[facility]2009   Qualifier: Diagnosis of  By: ToSherren MochaD, JeJory Ee . Malignant neoplasm of upper-outer quadrant of right breast in female, estrogen receptor positive ([facility]3/05/2019  . MCI (mild cognitive impairment)   . Memory loss 12/21/2016  . Morbid obesity ([facility]3/09/2010  . Pain of right side of body 08/31/2016  . Personal history of chemotherapy   . Personal history of radiation  therapy   . Pruritus 10/26/2016  . Renal disorder   . Right sided temporal headache 12/21/2016  . Tremor 12/21/2016  . Trigeminal neuralgia RIGHT  . Trigeminal neuralgia of right side of face 11/30/2010  . Trochanteric bursitis, right hip 10/26/2016  . Urgency of urination   . Weight loss 07/22/2017  . Zoster without complications 3/79/0240    SURGICAL HISTORY: Past Surgical History:  Procedure Laterality Date  . BREAST BIOPSY Right 11/11/2018  . BREAST LUMPECTOMY Right 11/28/2018  . BREAST LUMPECTOMY WITH RADIOACTIVE SEED AND SENTINEL LYMPH NODE BIOPSY Right 11/28/2018   Procedure: RIGHT BREAST  LUMPECTOMY WITH RADIOACTIVE SEED AND RIGHT AXILLARY SENTINEL LYMPH NODE BIOPSY;  Surgeon: Excell Seltzer, MD;  Location: [facility];  Service: General;  Laterality: Right;  . CEREBRAL MICROVASCULAR DECOMPRESSION  07-05-2006   RIGHT TRIGEMINAL NERVE  . CYSTOSCOPY/RETROGRADE/URETEROSCOPY/STONE EXTRACTION WITH BASKET  07/04/2012   Procedure: CYSTOSCOPY/RETROGRADE/URETEROSCOPY/STONE EXTRACTION WITH BASKET;  Surgeon: Claybon Jabs, MD;  Location: [facility];  Service: Urology;  Laterality: Left;  . EXPLORATORY LAPAROTOMY/ RESECTION MID TO DISTAL SIGMOID AND PROXIMAL RECTUM/ END PROXIMAL SIGMOID COLOSTOMY  07-17-2006   PERFORATED DIVERTICULITIS WITH PERITONITIS  . gamma knife  05/17/2016   for trigeminal neuralgia, [facility], Dr Salomon Fick  . gamma knife  2019  . IR CV LINE INJECTION  10/27/2019  . PORTACATH PLACEMENT Left 11/28/2018   Procedure: INSERTION PORT-A-CATH WITH ULTRASOUND;  Surgeon: Excell Seltzer, MD;  Location: [facility];  Service: General;  Laterality: Left;  . portacath removal    . RESECTION COLOSTOMY/ CLOSURE COLOSTOMY WITH COLOPROCTOSTOMY  02-20-2007  . TONSILLECTOMY  AS CHILD  . TOTAL KNEE ARTHROPLASTY Left 06/20/2020   Procedure: LEFT TOTAL KNEE ARTHROPLASTY;  Surgeon: Leandrew Koyanagi, MD;  Location: [facility];  Service: Orthopedics;  Laterality: Left;  . URETEROSCOPY  07/04/2012   Procedure: URETEROSCOPY;  Surgeon: Claybon Jabs, MD;  Location[facility];  Service: Urology;  Laterality: Left;  Marland Kitchen VAGINAL HYSTERECTOMY  1998   Partial  . WISDOM TOOTH EXTRACTION      I have reviewed the social history and family history with the patient and they are unchanged from previous note.  ALLERGIES:  is allergic to clindamycin hcl.  MEDICATIONS:  Current Outpatient Medications  Medication Sig Dispense Refill  . augmented betamethasone dipropionate (DIPROLENE-AF) 0.05 % cream Apply 1 application topically daily as needed for itching.    . diphenhydrAMINE  (BENADRYL) 25 MG tablet Take 25 mg by mouth every 6 (six) hours as needed for itching, allergies or sleep.    Marland Kitchen docusate sodium (COLACE) 100 MG capsule Take 1 capsule (100 mg total) by mouth daily as needed. 30 capsule 2  . exemestane (AROMASIN) 25 MG tablet Take 25 mg by mouth daily.    Marland Kitchen gabapentin (NEURONTIN) 600 MG tablet TAKE 2 TABLETS BY MOUTH 3 TIMES A DAY. Please call [phone] to schedule an appt. 540 tablet 4  . glycerin adult 2 g suppository Place 1 suppository rectally as needed for constipation. 12 suppository 0  . hydrOXYzine (ATARAX/VISTARIL) 25 MG tablet Take 0.5 tablets (12.5 mg total) by mouth every 8 (eight) hours as needed for itching. 30 tablet 0  . rivaroxaban (XARELTO) 10 MG TABS tablet Take 1 tablet (10 mg total) by mouth daily. 35 tablet 0  . traMADol (ULTRAM) 50 MG tablet Take 1-2 tabs po tid prn pain 30 tablet 0   No current facility-administered medications for this visit.    PHYSICAL EXAMINATION: ECOG PERFORMANCE STATUS:  1 - Symptomatic but completely ambulatory  Vitals:   08/15/20 1253  BP: (!) 145/75  Pulse: 76  Resp: 13  Temp: 97.6 F (36.4 C)  SpO2: 100%   Filed Weights   08/15/20 1253  Weight: 182 lb 11.2 oz (82.9 kg)    GENERAL:alert, no distress and comfortable SKIN: skin color, texture, turgor are normal, no rashes or significant lesions EYES: normal, Conjunctiva are pink and non-injected, sclera clear NECK: supple, thyroid normal size, non-tender, without nodularity LYMPH:  no palpable lymphadenopathy in the cervical, axillary  LUNGS: clear to auscultation and percussion with normal breathing effort HEART: regular rate & rhythm and no murmurs and no lower extremity edema ABDOMEN:abdomen soft, non-tender and normal bowel sounds Musculoskeletal:no cyanosis of digits and no clubbing  NEURO: alert & oriented x 3 with fluent speech, no focal motor/sensory deficits Breasts: Breast inspection showed them to be symmetrical with no nipple discharge.  S/p right lumpectomy and radiation with mild hyperpigmentation. Incisions well healed.  Palpation of the breasts and axilla revealed no obvious mass that I could appreciate.   LABORATORY DATA:  I have reviewed the data as listed CBC Latest Ref Rng & Units 08/15/2020 06/21/2020 06/13/2020  WBC 4.0 - 10.5 K/uL 4.9 7.2 4.4  Hemoglobin 12.0 - 15.0 g/dL 12.5 10.3(L) 12.6  Hematocrit 36 - 46 % 39.3 32.4(L) 41.2  Platelets 150 - 400 K/uL 281 204 249     CMP Latest Ref Rng & Units 08/15/2020 06/21/2020 06/13/2020  Glucose 70 - 99 mg/dL 83 108(H) 94  BUN 8 - 23 mg/dL 7(L) 12 9  Creatinine 0.44 - 1.00 mg/dL 0.74 0.72 0.84  Sodium 135 - 145 mmol/L 139 137 142  Potassium 3.5 - 5.1 mmol/L 3.6 4.1 3.8  Chloride 98 - 111 mmol/L 101 105 106  CO2 22 - 32 mmol/L _0 Calcium 8.9 - 10.3 mg/dL 9.5 8.3(L) 9.4  Total Protein 6.5 - 8.1 g/dL 7.2 - 6.9  Total Bilirubin 0.3 - 1.2 mg/dL 0.6 - 0.7  Alkaline Phos 38 - 126 U/L 136(H) - 100  AST 15 - 41 U/L 16 - 24  ALT 0 - 44 U/L 9 - 12      RADIOGRAPHIC STUDIES: I have personally reviewed the radiological images as listed and agreed with the findings in the report. No results found.   ASSESSMENT & PLAN:  Summer [name] is a 70 y.o. female with    1.Malignant neoplasm of upper-outer quadrant of right breast, StageIA,pT1b,N0,M0, ER/PR+, HER2+, GradeII -She was diagnosed in 11/2018. She is s/p right breastlumpectomyand SLNB and adjuvant radiation. She was treated with adjuvant chemo (poorly tolerated Taxol and Herceptin, d/c after 6 weeks), Kadcyla for 1 month, then Herceptin only until 12/2019.  -I started her on antiestrogen therapy with Exemestane in 06/2019. She has tolerated moderately due to known joint pain worsening in shoulders and knees. She is willing to continue for now. -She recently had left knee replacement, recovering well -She is clinically doing well, lab reviewed, exam was unremarkable, no clinical concern for  recurrence -Continue breast cancer surveillance, follow-up in 6 months -We again reviewed red flags for breast cancer recurrence, she knows to call us if she has any concerns.  2. Anemia,iron deficiency -Last colonoscopy was in 04/2012 which wasunremarkable except diverticulosis.  -She had partial colectomy in the past due to severe diverticulosis. -This is possiblyiron deficiency from diverticulosis.She denies any current bleeding.  -Her workupin early 2020showed iron 17, ferritin 4, normal retic ct, folate  Vitamin B12 and SPEP panels. -She received iv iron and anemia has resolved -Dr. Carlean Purl is open to have Colonoscopy for further evaluation. I recommend she proceed. -IV iron was given 3/18/20and 12/12/18. Anemia Has been resolved lately, will continue to monitor her iron level -She has not had repeat colonoscopy since 2017, I will send a message to Dr. Carlean Purl    3. Trigeminal neuralgia  -5/26/20brain and neck MRIwere stable. -She has been having moderate right side HA which I suspect is exacerbation of her neuralgia secondary to chemo. -Has not been completely controlled but managing with occasionally worsened flares.  -She has oxycodone which she can use for significant pain. Otherwise she is on Gabapentin 658m 7 tabs a day.  4.PeripheralNeuropathy, G1-2, Left leg pain  -Secondary to chemoTaxolstarted aftercycle 3. -Taxol switched toTDM-1 KDemetrius Charity6/12/20which was switched to Herceptin on 04/23/19 -On Robaxin, B12. High dose Gabapentin dose not help.  -Overall improved lately since she recovered from chemo -Continue to follow-up with urology  5.  Osteoarthritis and arthralgia -Status post left knee replacement in October 2021 -Follow-up with orthopedics  6. Osteopenia  -Her baseline DEXA from 07/2019 shows osteopenia with lowest T-score -1.1 at left hip.  -Will continue to monitor on AI as this can weaken her bones. Repeat every 2 years.  -I  recommend she start OTC calcium and Vitamin D. -Next annual mammogram in March 2022    PLAN: -Lab reviewed, she is clinically doing well, no concern for recurrence -Continue exemestane -Lab and follow-up in 6 months -We will copy Dr. GCarlean Purlabout her screening colonoscopy   No problem-specific Assessment & Plan notes found for this encounter.   Orders Placed This Encounter  Procedures  . MM DIAG BREAST TOMO BILATERAL    Standing Status:   Future    Standing Expiration Date:   08/15/2021    Order Specific Question:   Reason for Exam (SYMPTOM  OR DIAGNOSIS REQUIRED)    Answer:   screening    Order Specific Question:   [facility]    [facility]  All questions were answered. The patient knows to call the clinic with any problems, questions or concerns. No barriers to learning was detected. The total time spent in the appointment was 30 minutes.     YTruitt Merle MD 08/15/2020   I, AJoslyn Devon am acting as scribe for YTruitt Merle MD.   I have reviewed the above documentation for accuracy and completeness, and I agree with the above.

## 2020-08-16 ENCOUNTER — Other Ambulatory Visit: Payer: Federal, State, Local not specified - PPO

## 2020-08-16 ENCOUNTER — Encounter: Payer: Self-pay | Admitting: Hematology

## 2020-08-17 ENCOUNTER — Telehealth: Payer: Self-pay

## 2020-08-17 NOTE — Telephone Encounter (Signed)
I left Summer [name] a [employer] to call me back.

## 2020-08-17 NOTE — Telephone Encounter (Signed)
-----   Message from Gatha Mayer, MD sent at 08/16/2020  4:53 PM EST ----- I intended to scope her and we put her in our system to be contacted but it does not look like that happened.  We will call her and arrange an EGD and a colonoscopy for iron deficiency anemia.  PJ, please arrange this direct.  Glendell Docker ----- Message ----- From: Truitt Merle, MD Sent: 08/16/2020   4:45 PM EST To: Gatha Mayer, MD  Reed Pandy,  Her last colonoscopy was in 2013. She developed IDA in 2020, on oral iron, resolved now. Do you plan to repeat her colonoscopy soon? I believe I sent you a message about this early this year, but pt said your office staff told her she does not need it now. Just want to double check.  Thanks  Krista Blue

## 2020-08-18 ENCOUNTER — Ambulatory Visit (INDEPENDENT_AMBULATORY_CARE_PROVIDER_SITE_OTHER): Payer: Federal, State, Local not specified - PPO | Admitting: Rehabilitative and Restorative Service Providers"

## 2020-08-18 ENCOUNTER — Ambulatory Visit
Admission: RE | Admit: 2020-08-18 | Discharge: 2020-08-18 | Disposition: A | Discharge status: Home / Self Care | Payer: Federal, State, Local not specified - PPO | Source: Ambulatory Visit | Attending: Neurology | Admitting: Neurology

## 2020-08-18 ENCOUNTER — Other Ambulatory Visit: Payer: Self-pay

## 2020-08-18 ENCOUNTER — Encounter: Payer: Self-pay | Admitting: Rehabilitative and Restorative Service Providers"

## 2020-08-18 DIAGNOSIS — M25662 Stiffness of left knee, not elsewhere classified: Secondary | ICD-10-CM

## 2020-08-18 DIAGNOSIS — M25552 Pain in left hip: Secondary | ICD-10-CM

## 2020-08-18 DIAGNOSIS — R262 Difficulty in walking, not elsewhere classified: Secondary | ICD-10-CM | POA: Diagnosis not present

## 2020-08-18 DIAGNOSIS — R600 Localized edema: Secondary | ICD-10-CM

## 2020-08-18 DIAGNOSIS — M6281 Muscle weakness (generalized): Secondary | ICD-10-CM

## 2020-08-18 DIAGNOSIS — M5442 Lumbago with sciatica, left side: Secondary | ICD-10-CM | POA: Diagnosis not present

## 2020-08-18 DIAGNOSIS — G8929 Other chronic pain: Secondary | ICD-10-CM

## 2020-08-18 DIAGNOSIS — M25562 Pain in left knee: Secondary | ICD-10-CM | POA: Diagnosis not present

## 2020-08-18 NOTE — Therapy (Signed)
[place], [place], [address] Phone: [phone]   Fax:  [phone]  Physical Therapy Treatment  Patient Details  Name: Summer [name] MRN: [id number] Date of Birth: 21-Oct-1950 Referring Provider (PT): Dr. Erlinda Hong   Encounter Date: 08/18/2020   PT End of Session - 08/18/20 1018    Visit Number 11    Number of Visits 16    Date for PT Re-Evaluation 09/19/20    Authorization Type [place], Medicare    Progress Note Due on Visit 16    PT Start Time 1003    PT Stop Time 1045    PT Time Calculation (min) 42 min    Activity Tolerance Patient tolerated treatment well    Behavior During Therapy [facility] for tasks assessed/performed           Past Medical History:  Diagnosis Date  . Abdominal pain, left lower quadrant 11/09/2009   Qualifier: Diagnosis of  By: Valma Cava LPN, Izora Gala    . Allergic rhinitis 08/26/2008   Qualifier: Diagnosis of  By: Sherren Mocha MD, Jory Ee   . Anemia, iron deficiency 11/21/2018  . Arthritis shoulders and back  . BACK PAIN, CHRONIC 06/13/2007   Qualifier: Diagnosis of  By: Sherren Mocha MD, Jory Ee   . 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 Complication of anesthesia    Pt states having some "difficulty waking up"  . Constipation 07/01/2018  . Costochondritis 05/04/2014  . Diverticulitis of colon 06/13/2007   S/p segmental colectomy for perforated diverticulitis   . Diverticulosis   . Facial pain 10/06/2015  . Fibromyalgia   . Foot sprain, left, initial encounter 08/31/2016  . Glossitis 02/02/2016  . Headache 04/24/2016  . HEMATURIA UNSPECIFIED 06/25/2008   Qualifier: Diagnosis of  By: Sherren Mocha MD, Jory Ee   . HEMATURIA, HX OF 11/14/2009   Qualifier: Diagnosis of  By: Regino Schultze CMA (AAMA), Apolonio Schneiders    . History of kidney stones   . History of kidney stones   . History of shingles 2012--  no residual pain  . Itching 10/13/2018  . Left eye pain 07/01/2018  .  Left ureteral calculus   . LLQ pain 04/24/2016  . LOC OSTEOARTHROS NOT SPEC PRIM/SEC OTH [facility] SITE 06/27/2009   Qualifier: Diagnosis of  By: Sherren Mocha MD, Jory Ee   . Malignant neoplasm of upper-outer quadrant of right breast in female, estrogen receptor positive ([place]) 11/17/2018  . MCI (mild cognitive impairment)   . Memory loss 12/21/2016  . Morbid obesity ([place]) 11/09/2010  . Pain of right side of body 08/31/2016  . Personal history of chemotherapy   . Personal history of radiation therapy   . Pruritus 10/26/2016  . Renal disorder   . Right sided temporal headache 12/21/2016  . Tremor 12/21/2016  . Trigeminal neuralgia RIGHT  . Trigeminal neuralgia of right side of face 11/30/2010  . Trochanteric bursitis, right hip 10/26/2016  . Urgency of urination   . Weight loss 07/22/2017  . Zoster without complications 05/26/9449    Past Surgical History:  Procedure Laterality Date  . BREAST BIOPSY Right 11/11/2018  . BREAST LUMPECTOMY Right 11/28/2018  . BREAST LUMPECTOMY WITH RADIOACTIVE SEED AND SENTINEL LYMPH NODE BIOPSY Right 11/28/2018   Procedure: RIGHT BREAST LUMPECTOMY WITH RADIOACTIVE SEED AND RIGHT AXILLARY SENTINEL LYMPH NODE BIOPSY;  Surgeon: Excell Seltzer, MD;  Location: [place];  Service: General;  Laterality: Right;  . CEREBRAL MICROVASCULAR DECOMPRESSION  07-05-2006  RIGHT TRIGEMINAL NERVE  . CYSTOSCOPY/RETROGRADE/URETEROSCOPY/STONE EXTRACTION WITH BASKET  07/04/2012   Procedure: CYSTOSCOPY/RETROGRADE/URETEROSCOPY/STONE EXTRACTION WITH BASKET;  Surgeon: Claybon Jabs, MD;  Location: [facility];  Service: Urology;  Laterality: Left;  . EXPLORATORY LAPAROTOMY/ RESECTION MID TO DISTAL SIGMOID AND PROXIMAL RECTUM/ END PROXIMAL SIGMOID COLOSTOMY  07-17-2006   PERFORATED DIVERTICULITIS WITH PERITONITIS  . gamma knife  05/17/2016   for trigeminal neuralgia, [facility], Dr Salomon Fick  . gamma knife  2019  . IR CV LINE INJECTION  10/27/2019  . PORTACATH PLACEMENT Left 11/28/2018    Procedure: INSERTION PORT-A-CATH WITH ULTRASOUND;  Surgeon: Excell Seltzer, MD;  Location: [place];  Service: General;  Laterality: Left;  . portacath removal    . RESECTION COLOSTOMY/ CLOSURE COLOSTOMY WITH COLOPROCTOSTOMY  02-20-2007  . TONSILLECTOMY  AS CHILD  . TOTAL KNEE ARTHROPLASTY Left 06/20/2020   Procedure: LEFT TOTAL KNEE ARTHROPLASTY;  Surgeon: Leandrew Koyanagi, MD;  Location: [place];  Service: Orthopedics;  Laterality: Left;  . URETEROSCOPY  07/04/2012   Procedure: URETEROSCOPY;  Surgeon: Claybon Jabs, MD;  Location: [facility];  Service: Urology;  Laterality: Left;  Marland Kitchen VAGINAL HYSTERECTOMY  1998   Partial  . WISDOM TOOTH EXTRACTION      There were no vitals filed for this visit.   Subjective Assessment - 08/18/20 1013    Subjective Pt. stated feeling some pain in morning and also some pain c increased standing activity/soreness noted.  Swelling happens after prolonged walking.    Limitations Sitting;Lifting;Standing;Walking;House hold activities    Patient Stated Goals Get back to walking, gardening, cleaning house, walking independent    Currently in Pain? No/denies   no pain upon arrival.   Pain Onset More than a month ago    Pain Frequency Intermittent    Aggravating Factors  prolonged standing/walking, mornings              [facility] PT Assessment - 08/18/20 0001      Observation/Other Assessments   Focus on Therapeutic Outcomes (FOTO)  update 60%, expected outcome 63%                         [facility] Adult PT Treatment/Exercise - 08/18/20 0001      Neuro Re-ed    Neuro Re-ed Details  tandem ambulation fwd/back foam 8 ft x 5 each way in // bars, SLS on foam 30 sec x 4 bilateral, lateral stepping 3 cones x 10 bilateral      Knee/Hip Exercises: Aerobic   Recumbent Bike Lvl 3 10 mins seat 5      Knee/Hip Exercises: Standing   Forward Step Up Step Height: 6";15 reps;2 sets;Both                    PT Short Term Goals -  07/22/20 1021      PT SHORT TERM GOAL #1   Title Patient will demonstrate independent use of home exercise program to maintain progress from in clinic treatments.    Time 3    Period Weeks    Status On-going    Target Date 08/01/20             PT Long Term Goals - 08/15/20 1114      PT LONG TERM GOAL #1   Title Patient will demonstrate/report pain at worst less than or equal to 2/10 to facilitate minimal limitation in daily activity secondary to pain symptoms.    Time  10    Period Weeks    Status On-going    Target Date 09/19/20      PT LONG TERM GOAL #2   Title Patient will demonstrate independent use of home exercise program to facilitate ability to maintain/progress functional gains from skilled physical therapy services.    Time 10    Period Weeks    Status On-going    Target Date 09/19/20      PT LONG TERM GOAL #3   Title Pt. will demonstrate Lt knee AROM 0-110 deg to facilitate ability to perform walking, stairs, squat, transfers at PLOF s limitation.    Time 10    Period Weeks    Status On-going    Target Date 09/19/20      PT LONG TERM GOAL #4   Title Pt. will demonstrate Lt LE MMT 5/5 throughout to facilitate reciprocal gait on stairs, independent ambulation.    Time 10    Period Weeks    Status Partially Met    Target Date 09/19/20      PT LONG TERM GOAL #5   Title Pt. will demonstrate Lt SLS > 15 seconds to facilitate stability on uneven surfaces ambulation independent.    Time 10    Period Weeks    Status On-going      PT LONG TERM GOAL #6   Title Pt. will demonstrate independent ambulation at PLOF community distances > 300 ft.    Time 10    Period Weeks    Status Achieved                 Plan - 08/18/20 1037    Clinical Impression Statement Balance control to be improved to improve movement quality and safety on uneven surfaces.  Fair performance on compliant surface today.  FABER movement in sitting difficult and impaired shoe on/off  activity.    Personal Factors and Comorbidities Comorbidity 2    Comorbidities Fibromyalgia, obesity    Examination-Activity Limitations Bathing;Sit;Sleep;Bed Mobility;Bend;Squat;Stairs;Carry;Stand;Transfers;Lift;Locomotion Level    Examination-Participation Restrictions Yard Work;Cleaning;Community Activity    Stability/Clinical Decision Making Stable/Uncomplicated    Rehab Potential Good    PT Frequency Other (comment)    PT Treatment/Interventions ADLs/Self Care Home Management;Cryotherapy;Electrical Stimulation;Iontophoresis 46m/ml Dexamethasone;Moist Heat;Balance training;Therapeutic exercise;Therapeutic activities;Functional mobility training;Stair training;Gait training;Ultrasound;Neuromuscular re-education;Patient/family education;Manual techniques;Vasopneumatic Device;Taping;Dry needling;Passive range of motion;Joint Manipulations    PT Next Visit Plan continue strength and balance improvements    PT Home Exercise Plan [email or web address]    Consulted and Agree with Plan of Care Patient           Patient will benefit from skilled therapeutic intervention in order to improve the following deficits and impairments:  Abnormal gait,Decreased endurance,Hypomobility,Increased edema,Decreased activity tolerance,Decreased strength,Pain,Increased muscle spasms,Difficulty walking,Decreased mobility,Decreased balance,Decreased range of motion,Improper body mechanics,Impaired perceived functional ability,Impaired flexibility,Decreased coordination  Visit Diagnosis: Chronic pain of left knee  Muscle weakness (generalized)  Stiffness of left knee, not elsewhere classified  Difficulty in walking, not elsewhere classified  Localized edema     Problem List Patient Active Problem List   Diagnosis Date Noted  . Chronic left-sided low back pain with left-sided sciatica 08/10/2020  . Left hip pain 08/10/2020  . Status post total left knee replacement 06/20/2020  . Primary osteoarthritis of left knee  06/19/2020  . Left knee pain 02/26/2020  . Bilateral arm numbness and tingling while sleeping 02/26/2020  . COVID-19 11/06/2019  . Muscle cramps 05/12/2019  . Cramps, muscle, general 04/23/2019  . Tinnitus 04/16/2019  . Chest pain  of uncertain etiology 98/92/1194  . Bilateral leg edema 02/10/2019  . Port-A-Cath in place 01/30/2019  . Anemia, iron deficiency 11/21/2018  . Malignant neoplasm of upper-outer quadrant of right breast in female, estrogen receptor positive ([place]) 11/17/2018  . Itching 10/13/2018  . Left eye pain 07/01/2018  . Constipation 07/01/2018  . Weight loss 07/22/2017  . Memory loss 12/21/2016  . Tremor 12/21/2016  . Right sided temporal headache 12/21/2016  . Trochanteric bursitis, right hip 10/26/2016  . Pruritus 10/26/2016  . Foot sprain, left, initial encounter 08/31/2016  . Pain of right side of body 08/31/2016  . Headache 04/24/2016  . LLQ pain 04/24/2016  . Glossitis 02/02/2016  . Routine general medical examination at a health care facility 10/27/2015  . Facial pain 10/06/2015  . Trigeminal neuralgia of right side of face 11/30/2010  . Morbid obesity ([place]) 11/09/2010  . Fibromyalgia 11/09/2010  . HEMATURIA, HX OF 11/14/2009  . LOC OSTEOARTHROS NOT SPEC PRIM/SEC OTH [facility] SITE 06/27/2009  . Allergic rhinitis 08/26/2008  . VULVA INTRAEPITHELIAL NEOPLASIA, 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    Scot Jun, PT, DPT, OCS, ATC 08/18/20  12:11 PM    [facility] [address] [place], [place], [address] Phone: [phone]   Fax:  [phone]  Name: Summer [name] MRN: [id number] Date of Birth: 1950/10/27

## 2020-08-22 ENCOUNTER — Telehealth: Payer: Self-pay | Admitting: Neurology

## 2020-08-22 DIAGNOSIS — G8929 Other chronic pain: Secondary | ICD-10-CM

## 2020-08-22 NOTE — Telephone Encounter (Signed)
   IMPRESSION:   MRI lumbar spine (without) demonstrating: - At L3-4, L4-5: disc bulging and facet hypertrophy with severe right and moderate left foraminal stenosis.  - At L5-S1: disc bulging and facet hypertrophy with moderate left foraminal stenosis. - At L1-2: disc bulging and facet hypertrophy with moderate right foraminal stenosis.    Please call patient, MRI of lumbar spine showed multilevel degenerative changes, severe right, moderate left foraminal stenosis at L3-4, L4-5, there was no significant canal stenosis.

## 2020-08-22 NOTE — Telephone Encounter (Signed)
Left patient a detailed message, with results, on voicemail (ok per DPR).  Provided our number to call back with any questions. Also, informed her she is welcome to send any questions through mychart.

## 2020-08-23 NOTE — Telephone Encounter (Addendum)
The patient call back and she was provided with the MRI results. She would like to know next step for pain relief.   Per vo by Dr. Krista Blue, refer her for epidural steroid injection. She would like to try to get in before the end of the year since her deductible is met. She realizes this is dependent upon the other office and may not be possible.   Per vo by Dr. Krista Blue, refer to either Dr. Mina Marble or Dr. Nelva Bush to see if the patient can get in quickly.  Order placed in [place].

## 2020-08-23 NOTE — Addendum Note (Signed)
Addended by: Noberto Retort C on: 08/23/2020 11:39 AM   Modules accepted: Orders

## 2020-08-24 ENCOUNTER — Other Ambulatory Visit: Payer: Self-pay | Admitting: Hematology

## 2020-08-24 ENCOUNTER — Encounter: Payer: Self-pay | Admitting: Rehabilitative and Restorative Service Providers"

## 2020-08-24 ENCOUNTER — Other Ambulatory Visit: Payer: Self-pay

## 2020-08-24 ENCOUNTER — Ambulatory Visit (INDEPENDENT_AMBULATORY_CARE_PROVIDER_SITE_OTHER): Payer: Federal, State, Local not specified - PPO | Admitting: Rehabilitative and Restorative Service Providers"

## 2020-08-24 DIAGNOSIS — M25662 Stiffness of left knee, not elsewhere classified: Secondary | ICD-10-CM

## 2020-08-24 DIAGNOSIS — G8929 Other chronic pain: Secondary | ICD-10-CM

## 2020-08-24 DIAGNOSIS — R262 Difficulty in walking, not elsewhere classified: Secondary | ICD-10-CM | POA: Diagnosis not present

## 2020-08-24 DIAGNOSIS — M6281 Muscle weakness (generalized): Secondary | ICD-10-CM

## 2020-08-24 DIAGNOSIS — R600 Localized edema: Secondary | ICD-10-CM

## 2020-08-24 DIAGNOSIS — M25562 Pain in left knee: Secondary | ICD-10-CM

## 2020-08-24 NOTE — Therapy (Signed)
[facility] Physical Therapy [address] [place], [place], [address] Phone: [phone]   Fax:  [phone]  Physical Therapy Treatment  Patient Details  Name: Summer [name] MRN: [id number] Date of Birth: Jan 20, 1951 Referring Provider (PT): Dr. Erlinda Hong   Encounter Date: 08/24/2020   PT End of Session - 08/24/20 1016    Visit Number 12    Number of Visits 16    Date for PT Re-Evaluation 09/19/20    Authorization Type [place], Medicare    Progress Note Due on Visit 16    PT Start Time 1015    PT Stop Time 1055    PT Time Calculation (min) 40 min    Activity Tolerance Patient tolerated treatment well    Behavior During Therapy [facility] for tasks assessed/performed           Past Medical History:  Diagnosis Date  . Abdominal pain, left lower quadrant 11/09/2009   Qualifier: Diagnosis of  By: Valma Cava LPN, Izora Gala    . Allergic rhinitis 08/26/2008   Qualifier: Diagnosis of  By: Sherren Mocha MD, Jory Ee   . Anemia, iron deficiency 11/21/2018  . Arthritis shoulders and back  . BACK PAIN, CHRONIC 06/13/2007   Qualifier: Diagnosis of  By: Sherren Mocha MD, Jory Ee   . 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 Complication of anesthesia    Pt states having some "difficulty waking up"  . Constipation 07/01/2018  . Costochondritis 05/04/2014  . Diverticulitis of colon 06/13/2007   S/p segmental colectomy for perforated diverticulitis   . Diverticulosis   . Facial pain 10/06/2015  . Fibromyalgia   . Foot sprain, left, initial encounter 08/31/2016  . Glossitis 02/02/2016  . Headache 04/24/2016  . HEMATURIA UNSPECIFIED 06/25/2008   Qualifier: Diagnosis of  By: Sherren Mocha MD, Jory Ee   . HEMATURIA, HX OF 11/14/2009   Qualifier: Diagnosis of  By: Regino Schultze CMA (AAMA), Apolonio Schneiders    . History of kidney stones   . History of kidney stones   . History of shingles 2012--  no residual pain  . Itching 10/13/2018  . Left eye pain 07/01/2018  .  Left ureteral calculus   . LLQ pain 04/24/2016  . LOC OSTEOARTHROS NOT SPEC PRIM/SEC OTH [facility] SITE 06/27/2009   Qualifier: Diagnosis of  By: Sherren Mocha MD, Jory Ee   . Malignant neoplasm of upper-outer quadrant of right breast in female, estrogen receptor positive ([place]) 11/17/2018  . MCI (mild cognitive impairment)   . Memory loss 12/21/2016  . Morbid obesity ([place]) 11/09/2010  . Pain of right side of body 08/31/2016  . Personal history of chemotherapy   . Personal history of radiation therapy   . Pruritus 10/26/2016  . Renal disorder   . Right sided temporal headache 12/21/2016  . Tremor 12/21/2016  . Trigeminal neuralgia RIGHT  . Trigeminal neuralgia of right side of face 11/30/2010  . Trochanteric bursitis, right hip 10/26/2016  . Urgency of urination   . Weight loss 07/22/2017  . Zoster without complications 7/68/1157    Past Surgical History:  Procedure Laterality Date  . BREAST BIOPSY Right 11/11/2018  . BREAST LUMPECTOMY Right 11/28/2018  . BREAST LUMPECTOMY WITH RADIOACTIVE SEED AND SENTINEL LYMPH NODE BIOPSY Right 11/28/2018   Procedure: RIGHT BREAST LUMPECTOMY WITH RADIOACTIVE SEED AND RIGHT AXILLARY SENTINEL LYMPH NODE BIOPSY;  Surgeon: Excell Seltzer, MD;  Location: [place];  Service: General;  Laterality: Right;  . CEREBRAL MICROVASCULAR DECOMPRESSION  07-05-2006  RIGHT TRIGEMINAL NERVE  . CYSTOSCOPY/RETROGRADE/URETEROSCOPY/STONE EXTRACTION WITH BASKET  07/04/2012   Procedure: CYSTOSCOPY/RETROGRADE/URETEROSCOPY/STONE EXTRACTION WITH BASKET;  Surgeon: Claybon Jabs, MD;  [facility]  Service: Urology;  Laterality: Left;  . EXPLORATORY LAPAROTOMY/ RESECTION MID TO DISTAL SIGMOID AND PROXIMAL RECTUM/ END PROXIMAL SIGMOID COLOSTOMY  07-17-2006   PERFORATED DIVERTICULITIS WITH PERITONITIS  . gamma knife  05/17/2016   for trigeminal neuralgia, [facility], Dr Salomon Fick  . gamma knife  2019  . IR CV LINE INJECTION  10/27/2019  . PORTACATH PLACEMENT Left 11/28/2018    Procedure: INSERTION PORT-A-CATH WITH ULTRASOUND;  Surgeon: Excell Seltzer, MD;  Location: [place];  Service: General;  Laterality: Left;  . portacath removal    . RESECTION COLOSTOMY/ CLOSURE COLOSTOMY WITH COLOPROCTOSTOMY  02-20-2007  . TONSILLECTOMY  AS CHILD  . TOTAL KNEE ARTHROPLASTY Left 06/20/2020   Procedure: LEFT TOTAL KNEE ARTHROPLASTY;  Surgeon: Leandrew Koyanagi, MD;  Location: [place];  Service: Orthopedics;  Laterality: Left;  . URETEROSCOPY  07/04/2012   Procedure: URETEROSCOPY;  Surgeon: Claybon Jabs, MD;  Location: [facility];  Service: Urology;  Laterality: Left;  Marland Kitchen VAGINAL HYSTERECTOMY  1998   Partial  . WISDOM TOOTH EXTRACTION      There were no vitals filed for this visit.   Subjective Assessment - 08/24/20 1019    Subjective Pt. indicated once she gets moving she feels pretty good overall.  Pt. stated cold makes stiff.    Limitations Sitting;Lifting;Standing;Walking;House hold activities    Patient Stated Goals Get back to walking, gardening, cleaning house, walking independent    Currently in Pain? No/denies    Pain Onset More than a month ago                             [facility] Adult PT Treatment/Exercise - 08/24/20 0001      Neuro Re-ed    Neuro Re-ed Details  tandem ambulation fwd/back 15 ft x 5 each way, [place] side to side 15 ft x 4 each way c SBA, lateral stepping c high knee SL balance control 30 sec x 2      Knee/Hip Exercises: Stretches   Gastroc Stretch 30 seconds;3 reps;Both      Knee/Hip Exercises: Aerobic   Recumbent Bike Lvl 4 6 mins      Knee/Hip Exercises: Machines for Strengthening   Total Gym Leg Press 62 lbs 3 x 10 bilateral SL performance      Knee/Hip Exercises: Standing   Lateral Step Up Step Height: 6";15 reps;2 sets;Both                    PT Short Term Goals - 07/22/20 1021      PT SHORT TERM GOAL #1   Title Patient will demonstrate independent use of home exercise program to  maintain progress from in clinic treatments.    Time 3    Period Weeks    Status On-going    Target Date 08/01/20             PT Long Term Goals - 08/15/20 1114      PT LONG TERM GOAL #1   Title Patient will demonstrate/report pain at worst less than or equal to 2/10 to facilitate minimal limitation in daily activity secondary to pain symptoms.    Time 10    Period Weeks    Status On-going    Target Date 09/19/20  PT LONG TERM GOAL #2   Title Patient will demonstrate independent use of home exercise program to facilitate ability to maintain/progress functional gains from skilled physical therapy services.    Time 10    Period Weeks    Status On-going    Target Date 09/19/20      PT LONG TERM GOAL #3   Title Pt. will demonstrate Lt knee AROM 0-110 deg to facilitate ability to perform walking, stairs, squat, transfers at PLOF s limitation.    Time 10    Period Weeks    Status On-going    Target Date 09/19/20      PT LONG TERM GOAL #4   Title Pt. will demonstrate Lt LE MMT 5/5 throughout to facilitate reciprocal gait on stairs, independent ambulation.    Time 10    Period Weeks    Status Partially Met    Target Date 09/19/20      PT LONG TERM GOAL #5   Title Pt. will demonstrate Lt SLS > 15 seconds to facilitate stability on uneven surfaces ambulation independent.    Time 10    Period Weeks    Status On-going      PT LONG TERM GOAL #6   Title Pt. will demonstrate independent ambulation at PLOF community distances > 300 ft.    Time 10    Period Weeks    Status Achieved                 Plan - 08/24/20 1051    Clinical Impression Statement Pt. continued to show gains in balance control and overall strength as noted in improved step up/down control.  Pt. making progress towards anticipated d/c to HEP in next few visits pending any changes in medical presentation.    Personal Factors and Comorbidities Comorbidity 2    Comorbidities Fibromyalgia, obesity     Examination-Activity Limitations Bathing;Sit;Sleep;Bed Mobility;Bend;Squat;Stairs;Carry;Stand;Transfers;Lift;Locomotion Level    Examination-Participation Restrictions Yard Work;Cleaning;Community Activity    Stability/Clinical Decision Making Stable/Uncomplicated    Rehab Potential Good    PT Frequency Other (comment)    PT Treatment/Interventions ADLs/Self Care Home Management;Cryotherapy;Electrical Stimulation;Iontophoresis 61m/ml Dexamethasone;Moist Heat;Balance training;Therapeutic exercise;Therapeutic activities;Functional mobility training;Stair training;Gait training;Ultrasound;Neuromuscular re-education;Patient/family education;Manual techniques;Vasopneumatic Device;Taping;Dry needling;Passive range of motion;Joint Manipulations    PT Next Visit Plan continue strength and balance improvements c HEP transitioning focus.    PT Home Exercise Plan [id number]   Consulted and Agree with Plan of Care Patient           Patient will benefit from skilled therapeutic intervention in order to improve the following deficits and impairments:  Abnormal gait,Decreased endurance,Hypomobility,Increased edema,Decreased activity tolerance,Decreased strength,Pain,Increased muscle spasms,Difficulty walking,Decreased mobility,Decreased balance,Decreased range of motion,Improper body mechanics,Impaired perceived functional ability,Impaired flexibility,Decreased coordination  Visit Diagnosis: Chronic pain of left knee  Muscle weakness (generalized)  Stiffness of left knee, not elsewhere classified  Difficulty in walking, not elsewhere classified  Localized edema     Problem List Patient Active Problem List   Diagnosis Date Noted  . Chronic left-sided low back pain with left-sided sciatica 08/10/2020  . Left hip pain 08/10/2020  . Status post total left knee replacement 06/20/2020  . Primary osteoarthritis of left knee 06/19/2020  . Left knee pain 02/26/2020  . Bilateral arm numbness and  tingling while sleeping 02/26/2020  . COVID-19 11/06/2019  . Muscle cramps 05/12/2019  . Cramps, muscle, general 04/23/2019  . Tinnitus 04/16/2019  . Chest pain of uncertain etiology 089/37/3428 . Bilateral leg edema 02/10/2019  . Port-A-Cath  in place 01/30/2019  . Anemia, iron deficiency 11/21/2018  . Malignant neoplasm of upper-outer quadrant of right breast in female, estrogen receptor positive ([place]) 11/17/2018  . Itching 10/13/2018  . Left eye pain 07/01/2018  . Constipation 07/01/2018  . Weight loss 07/22/2017  . Memory loss 12/21/2016  . Tremor 12/21/2016  . Right sided temporal headache 12/21/2016  . Trochanteric bursitis, right hip 10/26/2016  . Pruritus 10/26/2016  . Foot sprain, left, initial encounter 08/31/2016  . Pain of right side of body 08/31/2016  . Headache 04/24/2016  . LLQ pain 04/24/2016  . Glossitis 02/02/2016  . Routine general medical examination at a health care facility 10/27/2015  . Facial pain 10/06/2015  . Trigeminal neuralgia of right side of face 11/30/2010  . Morbid obesity ([place]) 11/09/2010  . Fibromyalgia 11/09/2010  . HEMATURIA, HX OF 11/14/2009  . LOC OSTEOARTHROS NOT SPEC PRIM/SEC OTH [facility] SITE 06/27/2009  . Allergic rhinitis 08/26/2008  . VULVA INTRAEPITHELIAL NEOPLASIA, 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   Scot Jun, PT, DPT, OCS, ATC 08/24/20  10:53 AM    [facility] Physical Therapy [address] [place], [place], [address] Phone: [phone]   Fax:  [phone]  Name: Velecia Ovitt MRN: [id number] Date of Birth: [birth year]

## 2020-08-26 ENCOUNTER — Encounter: Payer: Self-pay | Admitting: Internal Medicine

## 2020-08-26 NOTE — Telephone Encounter (Signed)
Left another voice mail message to call me back. I am going to send her a [facility] message as well.

## 2020-08-26 NOTE — Telephone Encounter (Signed)
When I reach Summer [name] I need to confirm if she is on xarelto. Dr Carlean Purl does not think she is currently.

## 2020-08-29 ENCOUNTER — Encounter: Payer: Self-pay | Admitting: Internal Medicine

## 2020-08-29 NOTE — Telephone Encounter (Signed)
Summer [name] called back and she is not on a blood thinner. Appointments set up.

## 2020-08-29 NOTE — Telephone Encounter (Signed)
Noted Referral will be sent . Thanks Hinton Dyer

## 2020-08-31 ENCOUNTER — Other Ambulatory Visit: Payer: Self-pay

## 2020-08-31 ENCOUNTER — Ambulatory Visit (INDEPENDENT_AMBULATORY_CARE_PROVIDER_SITE_OTHER): Payer: Federal, State, Local not specified - PPO | Admitting: Rehabilitative and Restorative Service Providers"

## 2020-08-31 ENCOUNTER — Encounter: Payer: Self-pay | Admitting: Rehabilitative and Restorative Service Providers"

## 2020-08-31 DIAGNOSIS — R262 Difficulty in walking, not elsewhere classified: Secondary | ICD-10-CM

## 2020-08-31 DIAGNOSIS — M25662 Stiffness of left knee, not elsewhere classified: Secondary | ICD-10-CM | POA: Diagnosis not present

## 2020-08-31 DIAGNOSIS — M6281 Muscle weakness (generalized): Secondary | ICD-10-CM | POA: Diagnosis not present

## 2020-08-31 DIAGNOSIS — R600 Localized edema: Secondary | ICD-10-CM

## 2020-08-31 DIAGNOSIS — M25562 Pain in left knee: Secondary | ICD-10-CM | POA: Diagnosis not present

## 2020-08-31 DIAGNOSIS — G8929 Other chronic pain: Secondary | ICD-10-CM

## 2020-08-31 NOTE — Therapy (Addendum)
[facility] Physical Therapy [address] [place], [place], [address] Phone: [phone]   Fax:  [phone]  Physical Therapy Treatment/DC  Patient Details  Name: Summer [name] MRN: [id number] Date of Birth: 11/10/1950 Referring Provider (PT): Dr. Erlinda Hong  PHYSICAL THERAPY DISCHARGE SUMMARY  Visits from Start of Care: 13  Current functional level related to goals / functional outcomes: See note   Remaining deficits: See note   Education / Equipment: HEP  Plan: Patient agrees to discharge.  Patient goals were met. Patient is being discharged due to meeting the stated rehab goals.  ?????     Encounter Date: 08/31/2020   PT End of Session - 08/31/20 1023    Visit Number 13    Number of Visits 16    Date for PT Re-Evaluation 09/19/20    Authorization Type [place], Medicare    Progress Note Due on Visit 16    PT Start Time 1013    PT Stop Time 1053    PT Time Calculation (min) 40 min    Activity Tolerance Patient tolerated treatment well    Behavior During Therapy WFL for tasks assessed/performed           Past Medical History:  Diagnosis Date  . Abdominal pain, left lower quadrant 11/09/2009   Qualifier: Diagnosis of  By: Valma Cava LPN, Izora Gala    . Allergic rhinitis 08/26/2008   Qualifier: Diagnosis of  By: Sherren Mocha MD, Jory Ee   . Anemia, iron deficiency 11/21/2018  . Arthritis shoulders and back  . BACK PAIN, CHRONIC 06/13/2007   Qualifier: Diagnosis of  By: Sherren Mocha MD, Jory Ee   . 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 Complication of anesthesia    Pt states having some "difficulty waking up"  . Constipation 07/01/2018  . Costochondritis 05/04/2014  . Diverticulitis of colon 06/13/2007   S/p segmental colectomy for perforated diverticulitis   . Diverticulosis   . Facial pain 10/06/2015  . Fibromyalgia   . Foot sprain, left, initial encounter 08/31/2016  . Glossitis 02/02/2016  .  Headache 04/24/2016  . HEMATURIA UNSPECIFIED 06/25/2008   Qualifier: Diagnosis of  By: Sherren Mocha MD, Jory Ee   . History of kidney stones   . History of shingles 2012--  no residual pain  . Itching 10/13/2018  . Left eye pain 07/01/2018  . Left ureteral calculus   . LOC OSTEOARTHROS NOT SPEC PRIM/SEC OTH [facility] SITE 06/27/2009   Qualifier: Diagnosis of  By: Sherren Mocha MD, Jory Ee   . Malignant neoplasm of upper-outer quadrant of right breast in female, estrogen receptor positive ([place]) 11/17/2018  . MCI (mild cognitive impairment)   . Memory loss 12/21/2016  . Morbid obesity ([place]) 11/09/2010  . Pain of right side of body 08/31/2016  . Personal history of chemotherapy   . Personal history of radiation therapy   . Pruritus 10/26/2016  . Renal disorder   . Right sided temporal headache 12/21/2016  . Tremor 12/21/2016  . Trigeminal neuralgia RIGHT  . Trigeminal neuralgia of right side of face 11/30/2010  . Trochanteric bursitis, right hip 10/26/2016  . Urgency of urination   . Weight loss 07/22/2017  . Zoster without complications 3/87/5643    Past Surgical History:  Procedure Laterality Date  . BREAST BIOPSY Right 11/11/2018  . BREAST LUMPECTOMY Right 11/28/2018  . BREAST LUMPECTOMY WITH RADIOACTIVE SEED AND SENTINEL LYMPH NODE BIOPSY Right 11/28/2018   Procedure: RIGHT BREAST LUMPECTOMY WITH RADIOACTIVE  SEED AND RIGHT AXILLARY SENTINEL LYMPH NODE BIOPSY;  Surgeon: Excell Seltzer, MD;  Location: [place];  Service: General;  Laterality: Right;  . CEREBRAL MICROVASCULAR DECOMPRESSION  07-05-2006   RIGHT TRIGEMINAL NERVE  . CYSTOSCOPY/RETROGRADE/URETEROSCOPY/STONE EXTRACTION WITH BASKET  07/04/2012   Procedure: CYSTOSCOPY/RETROGRADE/URETEROSCOPY/STONE EXTRACTION WITH BASKET;  Surgeon: Claybon Jabs, MD;  [facility];  Service: Urology;  Laterality: Left;  . EXPLORATORY LAPAROTOMY/ RESECTION MID TO DISTAL SIGMOID AND PROXIMAL RECTUM/ END PROXIMAL SIGMOID COLOSTOMY  07-17-2006    PERFORATED DIVERTICULITIS WITH PERITONITIS  . gamma knife  05/17/2016   for trigeminal neuralgia, [facility], Dr Salomon Fick  . gamma knife  2019  . IR CV LINE INJECTION  10/27/2019  . PORTACATH PLACEMENT Left 11/28/2018   Procedure: INSERTION PORT-A-CATH WITH ULTRASOUND;  Surgeon: Excell Seltzer, MD;  Location: [place];  Service: General;  Laterality: Left;  . portacath removal    . RESECTION COLOSTOMY/ CLOSURE COLOSTOMY WITH COLOPROCTOSTOMY  02-20-2007  . TONSILLECTOMY  AS CHILD  . TOTAL KNEE ARTHROPLASTY Left 06/20/2020   Procedure: LEFT TOTAL KNEE ARTHROPLASTY;  Surgeon: Leandrew Koyanagi, MD;  Location: [place];  Service: Orthopedics;  Laterality: Left;  . URETEROSCOPY  07/04/2012   Procedure: URETEROSCOPY;  Surgeon: Claybon Jabs, MD;  Location: [facility];  Service: Urology;  Laterality: Left;  Marland Kitchen VAGINAL HYSTERECTOMY  1998   Partial  . WISDOM TOOTH EXTRACTION      There were no vitals filed for this visit.   Subjective Assessment - 08/31/20 1025    Subjective Pt. stated feeling pretty good overall with some similar complaints.    Limitations Sitting;Lifting;Standing;Walking;House hold activities    Patient Stated Goals Get back to walking, gardening, cleaning house, walking independent    Currently in Pain? No/denies    Pain Onset More than a month ago                             [facility] Adult PT Treatment/Exercise - 08/31/20 0001      Neuro Re-ed    Neuro Re-ed Details  tandem ambulation on foam 8 ft x 5 fwd/back, lateral stepping 8 ft x 5 each way      Knee/Hip Exercises: Aerobic   Recumbent Bike Lvl 4 10 mins      Knee/Hip Exercises: Machines for Strengthening   Cybex Knee Extension Eccentric Lt LE 3 x 10 15 lbs    Cybex Knee Flexion SL 3 x 10 15 lbs 3 x 10    Total Gym Leg Press 62 lbs 3 x 10 bilateral SL performance      Knee/Hip Exercises: Standing   Lateral Step Up Step Height: 6";15 reps;2 sets;Both                    PT  Short Term Goals - 07/22/20 1021      PT SHORT TERM GOAL #1   Title Patient will demonstrate independent use of home exercise program to maintain progress from in clinic treatments.    Time 3    Period Weeks    Status On-going    Target Date 08/01/20             PT Long Term Goals - 08/15/20 1114      PT LONG TERM GOAL #1   Title Patient will demonstrate/report pain at worst less than or equal to 2/10 to facilitate minimal limitation in daily activity secondary to pain  symptoms.    Time 10    Period Weeks    Status On-going    Target Date 09/19/20      PT LONG TERM GOAL #2   Title Patient will demonstrate independent use of home exercise program to facilitate ability to maintain/progress functional gains from skilled physical therapy services.    Time 10    Period Weeks    Status On-going    Target Date 09/19/20      PT LONG TERM GOAL #3   Title Pt. will demonstrate Lt knee AROM 0-110 deg to facilitate ability to perform walking, stairs, squat, transfers at PLOF s limitation.    Time 10    Period Weeks    Status On-going    Target Date 09/19/20      PT LONG TERM GOAL #4   Title Pt. will demonstrate Lt LE MMT 5/5 throughout to facilitate reciprocal gait on stairs, independent ambulation.    Time 10    Period Weeks    Status Partially Met    Target Date 09/19/20      PT LONG TERM GOAL #5   Title Pt. will demonstrate Lt SLS > 15 seconds to facilitate stability on uneven surfaces ambulation independent.    Time 10    Period Weeks    Status On-going      PT LONG TERM GOAL #6   Title Pt. will demonstrate independent ambulation at PLOF community distances > 300 ft.    Time 10    Period Weeks    Status Achieved                 Plan - 08/31/20 1034    Clinical Impression Statement Pt. is making good gains and improved HEP noted at this time.  Pt. is headed towards appropriate d/c on next visit.    Personal Factors and Comorbidities Comorbidity 2     Comorbidities Fibromyalgia, obesity    Examination-Activity Limitations Bathing;Sit;Sleep;Bed Mobility;Bend;Squat;Stairs;Carry;Stand;Transfers;Lift;Locomotion Level    Examination-Participation Restrictions Yard Work;Cleaning;Community Activity    Stability/Clinical Decision Making Stable/Uncomplicated    Rehab Potential Good    PT Frequency Other (comment)    PT Treatment/Interventions ADLs/Self Care Home Management;Cryotherapy;Electrical Stimulation;Iontophoresis 75m/ml Dexamethasone;Moist Heat;Balance training;Therapeutic exercise;Therapeutic activities;Functional mobility training;Stair training;Gait training;Ultrasound;Neuromuscular re-education;Patient/family education;Manual techniques;Vasopneumatic Device;Taping;Dry needling;Passive range of motion;Joint Manipulations    PT Next Visit Plan continue strength and balance improvements c HEP transitioning focus.    PT Home Exercise Plan [id number]   Consulted and Agree with Plan of Care Patient           Patient will benefit from skilled therapeutic intervention in order to improve the following deficits and impairments:  Abnormal gait,Decreased endurance,Hypomobility,Increased edema,Decreased activity tolerance,Decreased strength,Pain,Increased muscle spasms,Difficulty walking,Decreased mobility,Decreased balance,Decreased range of motion,Improper body mechanics,Impaired perceived functional ability,Impaired flexibility,Decreased coordination  Visit Diagnosis: Chronic pain of left knee  Muscle weakness (generalized)  Stiffness of left knee, not elsewhere classified  Difficulty in walking, not elsewhere classified  Localized edema     Problem List Patient Active Problem List   Diagnosis Date Noted  . Chronic left-sided low back pain with left-sided sciatica 08/10/2020  . Left hip pain 08/10/2020  . Status post total left knee replacement 06/20/2020  . Primary osteoarthritis of left knee 06/19/2020  . Left knee pain 02/26/2020   . Bilateral arm numbness and tingling while sleeping 02/26/2020  . COVID-19 11/06/2019  . Muscle cramps 05/12/2019  . Cramps, muscle, general 04/23/2019  . Tinnitus 04/16/2019  . Chest pain of  uncertain etiology 78/67/6720  . Bilateral leg edema 02/10/2019  . Port-A-Cath in place 01/30/2019  . Anemia, iron deficiency 11/21/2018  . Malignant neoplasm of upper-outer quadrant of right breast in female, estrogen receptor positive ([place]) 11/17/2018  . Itching 10/13/2018  . Left eye pain 07/01/2018  . Constipation 07/01/2018  . Weight loss 07/22/2017  . Memory loss 12/21/2016  . Tremor 12/21/2016  . Right sided temporal headache 12/21/2016  . Trochanteric bursitis, right hip 10/26/2016  . Pruritus 10/26/2016  . Foot sprain, left, initial encounter 08/31/2016  . Pain of right side of body 08/31/2016  . Headache 04/24/2016  . LLQ pain 04/24/2016  . Glossitis 02/02/2016  . Routine general medical examination at a health care facility 10/27/2015  . Facial pain 10/06/2015  . Trigeminal neuralgia of right side of face 11/30/2010  . Morbid obesity ([place]) 11/09/2010  . Fibromyalgia 11/09/2010  . HEMATURIA, HX OF 11/14/2009  . LOC OSTEOARTHROS NOT SPEC PRIM/SEC OTH [facility] SITE 06/27/2009  . Allergic rhinitis 08/26/2008  . VULVA INTRAEPITHELIAL NEOPLASIA, 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    Scot Jun, PT, DPT, OCS, ATC 08/31/20  10:49 AM  Farley Ly PT, MPT  [facility] Physical Therapy [address] [place], [place], [address] Phone: [phone]   Fax:  [phone]  Name: Khushi Zupko MRN: [id number] Date of Birth: 1951/07/09

## 2020-09-05 ENCOUNTER — Ambulatory Visit (INDEPENDENT_AMBULATORY_CARE_PROVIDER_SITE_OTHER): Payer: Federal, State, Local not specified - PPO | Admitting: Family

## 2020-09-05 ENCOUNTER — Encounter: Payer: Self-pay | Admitting: Family

## 2020-09-05 ENCOUNTER — Other Ambulatory Visit: Payer: Self-pay

## 2020-09-05 VITALS — BP 134/68 | HR 89 | Temp 97.2°F | Ht 66.0 in | Wt 184.6 lb

## 2020-09-05 DIAGNOSIS — N3001 Acute cystitis with hematuria: Secondary | ICD-10-CM

## 2020-09-05 DIAGNOSIS — R300 Dysuria: Secondary | ICD-10-CM

## 2020-09-05 LAB — POCT URINALYSIS DIPSTICK
Bilirubin, UA: NEGATIVE
Glucose, UA: NEGATIVE
Ketones, UA: NEGATIVE
Nitrite, UA: NEGATIVE
Protein, UA: POSITIVE — AB
Spec Grav, UA: >=1.03 — AB (ref 1.010–1.025)
Urobilinogen, UA: 1 E.U./dL
pH, UA: 6 (ref 5.0–8.0)

## 2020-09-05 MED ORDER — CEPHALEXIN 500 MG PO CAPS: 500.0000 mg | ORAL_CAPSULE | Freq: Two times a day (BID) | ORAL | 0 refills | Status: DC

## 2020-09-05 NOTE — Patient Instructions (Signed)

## 2020-09-05 NOTE — Addendum Note (Signed)
Addended by: Lynda Rainwater on: 09/05/2020 01:46 PM   Modules accepted: Orders

## 2020-09-05 NOTE — Progress Notes (Signed)
Acute Office Visit  Subjective:    Patient ID: Summer [name], female    DOB: 1950/11/11, 69 y.o.   MRN: [id number]  Chief Complaint  Patient presents with  . Hematuria    Blood in urine pressure/pain x 3 days.     HPI Patient is in today with c/o burning with urination, frequency, and urgency to urinate x 3 days and worsening. She admits to decrease intake of water and holding her urine. Not sexually active   Past Medical History:  Diagnosis Date  . Abdominal pain, left lower quadrant 11/09/2009   Qualifier: Diagnosis of  By: Valma Cava LPN, Izora Gala    . Allergic rhinitis 08/26/2008   Qualifier: Diagnosis of  By: Sherren Mocha MD, Jory Ee   . Anemia, iron deficiency 11/21/2018  . Arthritis shoulders and back  . BACK PAIN, CHRONIC 06/13/2007   Qualifier: Diagnosis of  By: Sherren Mocha MD, Jory Ee   . 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 Complication of anesthesia    Pt states having some "difficulty waking up"  . Constipation 07/01/2018  . Costochondritis 05/04/2014  . Diverticulitis of colon 06/13/2007   S/p segmental colectomy for perforated diverticulitis   . Diverticulosis   . Facial pain 10/06/2015  . Fibromyalgia   . Foot sprain, left, initial encounter 08/31/2016  . Glossitis 02/02/2016  . Headache 04/24/2016  . HEMATURIA UNSPECIFIED 06/25/2008   Qualifier: Diagnosis of  By: Sherren Mocha MD, Jory Ee   . History of kidney stones   . History of shingles 2012--  no residual pain  . Itching 10/13/2018  . Left eye pain 07/01/2018  . Left ureteral calculus   . LOC OSTEOARTHROS NOT SPEC PRIM/SEC OTH [facility] SITE 06/27/2009   Qualifier: Diagnosis of  By: Sherren Mocha MD, Jory Ee   . Malignant neoplasm of upper-outer quadrant of right breast in female, estrogen receptor positive ([place]) 11/17/2018  . MCI (mild cognitive impairment)   . Memory loss 12/21/2016  . Morbid obesity ([place]) 11/09/2010  . Pain of right side of body 08/31/2016  . Personal history of  chemotherapy   . Personal history of radiation therapy   . Pruritus 10/26/2016  . Renal disorder   . Right sided temporal headache 12/21/2016  . Tremor 12/21/2016  . Trigeminal neuralgia RIGHT  . Trigeminal neuralgia of right side of face 11/30/2010  . Trochanteric bursitis, right hip 10/26/2016  . Urgency of urination   . Weight loss 07/22/2017  . Zoster without complications [id number]    Past Surgical History:  Procedure Laterality Date  . BREAST BIOPSY Right 11/11/2018  . BREAST LUMPECTOMY Right 11/28/2018  . BREAST LUMPECTOMY WITH RADIOACTIVE SEED AND SENTINEL LYMPH NODE BIOPSY Right 11/28/2018   Procedure: RIGHT BREAST LUMPECTOMY WITH RADIOACTIVE SEED AND RIGHT AXILLARY SENTINEL LYMPH NODE BIOPSY;  Surgeon: Excell Seltzer, MD;  Location: [place];  Service: General;  Laterality: Right;  . CEREBRAL MICROVASCULAR DECOMPRESSION  07-05-2006   RIGHT TRIGEMINAL NERVE  . CYSTOSCOPY/RETROGRADE/URETEROSCOPY/STONE EXTRACTION WITH BASKET  07/04/2012   Procedure: CYSTOSCOPY/RETROGRADE/URETEROSCOPY/STONE EXTRACTION WITH BASKET;  Surgeon: Claybon Jabs, MD;  Location: [facility];  Service: Urology;  Laterality: Left;  . EXPLORATORY LAPAROTOMY/ RESECTION MID TO DISTAL SIGMOID AND PROXIMAL RECTUM/ END PROXIMAL SIGMOID COLOSTOMY  07-17-2006   PERFORATED DIVERTICULITIS WITH PERITONITIS  . gamma knife  05/17/2016   for trigeminal neuralgia, [facility], Dr Salomon Fick  . gamma knife  2019  . IR CV LINE INJECTION  10/27/2019  .  PORTACATH PLACEMENT Left 11/28/2018   Procedure: INSERTION PORT-A-CATH WITH ULTRASOUND;  Surgeon: Excell Seltzer, MD;  Location: [place];  Service: General;  Laterality: Left;  . portacath removal    . RESECTION COLOSTOMY/ CLOSURE COLOSTOMY WITH COLOPROCTOSTOMY  02-20-2007  . TONSILLECTOMY  AS CHILD  . TOTAL KNEE ARTHROPLASTY Left 06/20/2020   Procedure: LEFT TOTAL KNEE ARTHROPLASTY;  Surgeon: Leandrew Koyanagi, MD;  Location: [place];  Service: Orthopedics;  Laterality:  Left;  . URETEROSCOPY  07/04/2012   Procedure: URETEROSCOPY;  Surgeon: Claybon Jabs, MD;  Location: [facility];  Service: Urology;  Laterality: Left;  Marland Kitchen VAGINAL HYSTERECTOMY  1998   Partial  . WISDOM TOOTH EXTRACTION      Family History  Problem Relation Age of Onset  . Heart disease Maternal Grandmother   . Diabetes Mother   . Breast cancer Maternal Aunt   . Lupus Cousin   . Colon cancer Neg Hx   . Esophageal cancer Neg Hx   . Rectal cancer Neg Hx   . Stomach cancer Neg Hx     Social History   Socioeconomic History  . Marital status: Married    Spouse name: Not on file  . Number of children: 1  . Years of education: College  . Highest education level: Not on file  Occupational History    Employer: UNEMPLOYED  . Occupation: HUMAN RESOURCES     Employer: [employer]  Tobacco Use  . Smoking status: Never Smoker  . Smokeless tobacco: Never Used  Vaping Use  . Vaping Use: Never used  Substance and Sexual Activity  . Alcohol use: Yes    Comment: rarely  . Drug use: No  . Sexual activity: Not on file  Other Topics Concern  . Not on file  Social History Narrative  . Not on file   Social Determinants of Health   Financial Resource Strain: Not on file  Food Insecurity: Not on file  Transportation Needs: Not on file  Physical Activity: Not on file  Stress: Not on file  Social Connections: Not on file  Intimate Partner Violence: Not on file    Outpatient Medications Prior to Visit  Medication Sig Dispense Refill  . augmented betamethasone dipropionate (DIPROLENE-AF) 0.05 % cream Apply 1 application topically daily as needed for itching.    . diphenhydrAMINE (BENADRYL) 25 MG tablet Take 25 mg by mouth every 6 (six) hours as needed for itching, allergies or sleep.    Marland Kitchen docusate sodium (COLACE) 100 MG capsule Take 1 capsule (100 mg total) by mouth daily as needed. 30 capsule 2  . exemestane (AROMASIN) 25 MG tablet TAKE 1 TABLET (25 MG TOTAL) BY  MOUTH DAILY AFTER BREAKFAST. 90 tablet 1  . gabapentin (NEURONTIN) 600 MG tablet TAKE 2 TABLETS BY MOUTH 3 TIMES A DAY. Please call [phone] to schedule an appt. 540 tablet 4  . glycerin adult 2 g suppository Place 1 suppository rectally as needed for constipation. 12 suppository 0  . hydrOXYzine (ATARAX/VISTARIL) 25 MG tablet Take 0.5 tablets (12.5 mg total) by mouth every 8 (eight) hours as needed for itching. 30 tablet 0  . rivaroxaban (XARELTO) 10 MG TABS tablet Take 1 tablet (10 mg total) by mouth daily. 35 tablet 0  . traMADol (ULTRAM) 50 MG tablet Take 1-2 tabs po tid prn pain 30 tablet 0   No facility-administered medications prior to visit.    Allergies  Allergen Reactions  . Clindamycin Hcl Itching    Review  of Systems  Constitutional: Negative.   Respiratory: Negative.   Cardiovascular: Negative.   Gastrointestinal: Negative.   Genitourinary: Positive for dysuria, frequency, hematuria and urgency. Negative for vaginal discharge and vaginal pain.  Musculoskeletal: Negative.   Neurological: Negative.   Hematological: Negative.   Psychiatric/Behavioral: Negative.        Objective:    Physical Exam Constitutional:      Appearance: Normal appearance.  Cardiovascular:     Rate and Rhythm: Normal rate and regular rhythm.  Pulmonary:     Effort: Pulmonary effort is normal.     Breath sounds: Normal breath sounds.  Abdominal:     General: Abdomen is flat.     Palpations: Abdomen is soft.     Tenderness: There is abdominal tenderness. There is no rebound.  Musculoskeletal:        General: Normal range of motion.     Cervical back: Normal range of motion and neck supple.  Skin:    General: Skin is warm and dry.  Neurological:     General: No focal deficit present.     Mental Status: She is alert and oriented to person, place, and time.  Psychiatric:        Mood and Affect: Mood normal.        Behavior: Behavior normal.     BP 134/68   Pulse 89   Temp (!) 97.2  F (36.2 C) (Temporal)   Ht 5\' 6"  (1.676 m)   Wt 184 lb 9.6 oz (83.7 kg)   SpO2 98%   BMI 29.80 kg/m  Wt Readings from Last 3 Encounters:  09/05/20 184 lb 9.6 oz (83.7 kg)  08/15/20 182 lb 11.2 oz (82.9 kg)  07/05/20 192 lb (87.1 kg)    Health Maintenance Due  Topic Date Due  . COVID-19 Vaccine (1) Never done  . PNA vac Low Risk Adult (1 of 2 - PCV13) Never done  . INFLUENZA VACCINE  Never done    There are no preventive care reminders to display for this patient.   Lab Results  Component Value Date   TSH 2.35 02/26/2020   Lab Results  Component Value Date   WBC 4.9 08/15/2020   HGB 12.5 08/15/2020   HCT 39.3 08/15/2020   MCV 90.8 08/15/2020   PLT 281 08/15/2020   Lab Results  Component Value Date   NA 139 08/15/2020   K 3.6 08/15/2020   CO2 31 08/15/2020   GLUCOSE 83 08/15/2020   BUN 7 (L) 08/15/2020   CREATININE 0.74 08/15/2020   BILITOT 0.6 08/15/2020   ALKPHOS 136 (H) 08/15/2020   AST 16 08/15/2020   ALT 9 08/15/2020   PROT 7.2 08/15/2020   ALBUMIN 3.5 08/15/2020   CALCIUM 9.5 08/15/2020   ANIONGAP 7 08/15/2020   GFR 93.87 07/01/2018   Lab Results  Component Value Date   CHOL 193 02/26/2020   Lab Results  Component Value Date   HDL 72.30 02/26/2020   Lab Results  Component Value Date   LDLCALC 111 (H) 02/26/2020   Lab Results  Component Value Date   TRIG 46.0 02/26/2020   Lab Results  Component Value Date   CHOLHDL 3 02/26/2020   Lab Results  Component Value Date   HGBA1C 5.6 02/26/2020       Assessment & Plan:   Problem List Items Addressed This Visit   None   Visit Diagnoses    Dysuria    -  Primary   Relevant Medications  cephALEXin (KEFLEX) 500 MG capsule   Other Relevant Orders   Urine culture   Acute cystitis with hematuria       Relevant Medications   cephALEXin (KEFLEX) 500 MG capsule   Other Relevant Orders   Urine culture       Meds ordered this encounter  Medications  . cephALEXin (KEFLEX) 500 MG  capsule    Sig: Take 1 capsule (500 mg total) by mouth 2 (two) times daily.    Dispense:  14 capsule    Refill:  0   Call the office with symptoms worsen or persist. Recheck as scheduled and prn  Kennyth Arnold, FNP

## 2020-09-06 LAB — URINE CULTURE
MICRO NUMBER:: 11358264
Result:: NO GROWTH
SPECIMEN QUALITY:: ADEQUATE

## 2020-09-07 ENCOUNTER — Encounter: Payer: Federal, State, Local not specified - PPO | Admitting: Rehabilitative and Restorative Service Providers"

## 2020-09-07 ENCOUNTER — Telehealth: Payer: Self-pay

## 2020-09-07 MED ORDER — NITROFURANTOIN MONOHYD MACRO 100 MG PO CAPS: 100.0000 mg | ORAL_CAPSULE | Freq: Two times a day (BID) | ORAL | 0 refills | Status: DC

## 2020-09-07 NOTE — Telephone Encounter (Signed)
Pt informed

## 2020-09-07 NOTE — Telephone Encounter (Signed)
Pt states she would like to know if she could have another Rx called in because the Keflex she was recently prescribed has been causing her to have dizzy spells? Please advise

## 2020-09-12 ENCOUNTER — Telehealth: Payer: Federal, State, Local not specified - PPO | Admitting: Neurology

## 2020-09-13 ENCOUNTER — Encounter: Payer: Federal, State, Local not specified - PPO | Admitting: Rehabilitative and Restorative Service Providers"

## 2020-09-13 ENCOUNTER — Other Ambulatory Visit: Payer: Self-pay | Admitting: Physician Assistant

## 2020-09-13 ENCOUNTER — Ambulatory Visit (INDEPENDENT_AMBULATORY_CARE_PROVIDER_SITE_OTHER): Payer: Federal, State, Local not specified - PPO | Admitting: Physician Assistant

## 2020-09-13 DIAGNOSIS — Z96652 Presence of left artificial knee joint: Secondary | ICD-10-CM

## 2020-09-13 MED ORDER — AMOXICILLIN 500 MG PO CAPS: ORAL_CAPSULE | ORAL | 2 refills | Status: DC

## 2020-09-13 NOTE — Progress Notes (Signed)
Post-Op Visit Note   Patient: Summer [name]           Date of Birth: 1951/08/24           MRN: [id number] Visit Date: 09/13/2020 PCP: Hoyt Koch, MD   Assessment & Plan:  Chief Complaint:  Chief Complaint  Patient presents with  . Left Knee - Routine Post Op   Visit Diagnoses:  1. History of total knee replacement, left     Plan: Patient is a pleasant 70 year old female who comes in today 3 months out left total knee replacement.  She has been doing well.  She does note that she gets increased pain and swelling to the left knee when she has been walking 1 to 2 hours.  Examination of her left knee reveals a fully healed surgical scar without complication.  She does have a keloid.  Range of motion from 0 to 125 degrees.  Stable valgus varus stress.  She is neurovascular intact distally.  At this point, she will continue to increase activity as tolerated.  She may apply vitamin E or Mederma to the scar and massage for desensitization.  She does have an upcoming colonoscopy so we will go ahead and send in antibiotics.  She may return to work full duty without restrictions on 09/27/2020.  She will follow up with [place] in 3 months time for repeat evaluation and 2 view x-rays of the left knee.  Follow-Up Instructions: Return in about 3 months (around 12/12/2020).   Orders:  No orders of the defined types were placed in this encounter.  No orders of the defined types were placed in this encounter.   Imaging: No new imaging  PMFS History: Patient Active Problem List   Diagnosis Date Noted  . Chronic left-sided low back pain with left-sided sciatica 08/10/2020  . Left hip pain 08/10/2020  . Status post total left knee replacement 06/20/2020  . Primary osteoarthritis of left knee 06/19/2020  . Left knee pain 02/26/2020  . Bilateral arm numbness and tingling while sleeping 02/26/2020  . COVID-19 11/06/2019  . Muscle cramps 05/12/2019  . Cramps, muscle, general  04/23/2019  . Tinnitus 04/16/2019  . Chest pain of uncertain etiology [id number]  . Bilateral leg edema 02/10/2019  . Port-A-Cath in place 01/30/2019  . Anemia, iron deficiency 11/21/2018  . Malignant neoplasm of upper-outer quadrant of right breast in female, estrogen receptor positive ([place]) 11/17/2018  . Itching 10/13/2018  . Left eye pain 07/01/2018  . Constipation 07/01/2018  . Weight loss 07/22/2017  . Memory loss 12/21/2016  . Tremor 12/21/2016  . Right sided temporal headache 12/21/2016  . Trochanteric bursitis, right hip 10/26/2016  . Pruritus 10/26/2016  . Foot sprain, left, initial encounter 08/31/2016  . Pain of right side of body 08/31/2016  . Headache 04/24/2016  . LLQ pain 04/24/2016  . Glossitis 02/02/2016  . Routine general medical examination at a health care facility 10/27/2015  . Facial pain 10/06/2015  . Trigeminal neuralgia of right side of face 11/30/2010  . Morbid obesity ([place]) 11/09/2010  . Fibromyalgia 11/09/2010  . HEMATURIA, HX OF 11/14/2009  . LOC OSTEOARTHROS NOT SPEC PRIM/SEC OTH [facility] SITE 06/27/2009  . Allergic rhinitis 08/26/2008  . 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 Abdominal pain, left lower quadrant 11/09/2009   Qualifier: Diagnosis of  By: Valma Cava LPN, Izora Gala    . Allergic rhinitis 08/26/2008   Qualifier: Diagnosis of  By: Sherren Mocha  MD, Jory Ee   . Anemia, iron deficiency 11/21/2018  . Arthritis shoulders and back  . BACK PAIN, CHRONIC 06/13/2007   Qualifier: Diagnosis of  By: Sherren Mocha MD, Jory Ee   . 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 Complication of anesthesia    Pt states having some "difficulty waking up"  . Constipation 07/01/2018  . Costochondritis 05/04/2014  . Diverticulitis of colon 06/13/2007   S/p segmental colectomy for perforated diverticulitis   . Diverticulosis   . Facial pain 10/06/2015  . Fibromyalgia   . Foot  sprain, left, initial encounter 08/31/2016  . Glossitis 02/02/2016  . Headache 04/24/2016  . HEMATURIA UNSPECIFIED 06/25/2008   Qualifier: Diagnosis of  By: Sherren Mocha MD, Jory Ee   . History of kidney stones   . History of shingles 2012--  no residual pain  . Itching 10/13/2018  . Left eye pain 07/01/2018  . Left ureteral calculus   . LOC OSTEOARTHROS NOT SPEC PRIM/SEC OTH [facility] SITE 06/27/2009   Qualifier: Diagnosis of  By: Sherren Mocha MD, Jory Ee   . Malignant neoplasm of upper-outer quadrant of right breast in female, estrogen receptor positive ([place]) 11/17/2018  . MCI (mild cognitive impairment)   . Memory loss 12/21/2016  . Morbid obesity ([place]) 11/09/2010  . Pain of right side of body 08/31/2016  . Personal history of chemotherapy   . Personal history of radiation therapy   . Pruritus 10/26/2016  . Renal disorder   . Right sided temporal headache 12/21/2016  . Tremor 12/21/2016  . Trigeminal neuralgia RIGHT  . Trigeminal neuralgia of right side of face 11/30/2010  . Trochanteric bursitis, right hip 10/26/2016  . Urgency of urination   . Weight loss 07/22/2017  . Zoster without complications [id number]    Family History  Problem Relation Age of Onset  . Heart disease Maternal Grandmother   . Diabetes Mother   . Breast cancer Maternal Aunt   . Lupus Cousin   . Colon cancer Neg Hx   . Esophageal cancer Neg Hx   . Rectal cancer Neg Hx   . Stomach cancer Neg Hx     Past Surgical History:  Procedure Laterality Date  . BREAST BIOPSY Right 11/11/2018  . BREAST LUMPECTOMY Right 11/28/2018  . BREAST LUMPECTOMY WITH RADIOACTIVE SEED AND SENTINEL LYMPH NODE BIOPSY Right 11/28/2018   Procedure: RIGHT BREAST LUMPECTOMY WITH RADIOACTIVE SEED AND RIGHT AXILLARY SENTINEL LYMPH NODE BIOPSY;  Surgeon: Excell Seltzer, MD;  Location: [place];  Service: General;  Laterality: Right;  . CEREBRAL MICROVASCULAR DECOMPRESSION  07-05-2006   RIGHT TRIGEMINAL NERVE  . CYSTOSCOPY/RETROGRADE/URETEROSCOPY/STONE  EXTRACTION WITH BASKET  07/04/2012   Procedure: CYSTOSCOPY/RETROGRADE/URETEROSCOPY/STONE EXTRACTION WITH BASKET;  Surgeon: Claybon Jabs, MD;  Location: [facility];  Service: Urology;  Laterality: Left;  . EXPLORATORY LAPAROTOMY/ RESECTION MID TO DISTAL SIGMOID AND PROXIMAL RECTUM/ END PROXIMAL SIGMOID COLOSTOMY  07-17-2006   PERFORATED DIVERTICULITIS WITH PERITONITIS  . gamma knife  05/17/2016   for trigeminal neuralgia, [facility], Dr Salomon Fick  . gamma knife  2019  . IR CV LINE INJECTION  10/27/2019  . PORTACATH PLACEMENT Left 11/28/2018   Procedure: INSERTION PORT-A-CATH WITH ULTRASOUND;  Surgeon: Excell Seltzer, MD;  Location: [place];  Service: General;  Laterality: Left;  . portacath removal    . RESECTION COLOSTOMY/ CLOSURE COLOSTOMY WITH COLOPROCTOSTOMY  02-20-2007  . TONSILLECTOMY  AS CHILD  . TOTAL KNEE ARTHROPLASTY Left 06/20/2020   Procedure: LEFT  TOTAL KNEE ARTHROPLASTY;  Surgeon: Tarry Kos, MD;  Location: [facility];  Service: Orthopedics;  Laterality: Left;  . URETEROSCOPY  07/04/2012   Procedure: URETEROSCOPY;  Surgeon: Garnett Farm, MD;  Location: [facility];  Service: Urology;  Laterality: Left;  Marland Kitchen VAGINAL HYSTERECTOMY  1998   Partial  . WISDOM TOOTH EXTRACTION     Social History   Occupational History    Employer: UNEMPLOYED  . Occupation: HUMAN RESOURCES     Employer: [employer]  Tobacco Use  . Smoking status: Never Smoker  . Smokeless tobacco: Never Used  Vaping Use  . Vaping Use: Never used  Substance and Sexual Activity  . Alcohol use: Yes    Comment: rarely  . Drug use: No  . Sexual activity: Not on file

## 2020-09-14 ENCOUNTER — Ambulatory Visit: Payer: Federal, State, Local not specified - PPO | Admitting: Internal Medicine

## 2020-09-15 ENCOUNTER — Other Ambulatory Visit: Payer: Self-pay

## 2020-09-15 ENCOUNTER — Encounter: Payer: Self-pay | Admitting: Internal Medicine

## 2020-09-15 ENCOUNTER — Ambulatory Visit: Payer: Federal, State, Local not specified - PPO | Admitting: Internal Medicine

## 2020-09-15 VITALS — BP 116/72 | HR 65 | Temp 98.4°F | Ht 66.0 in | Wt 186.0 lb

## 2020-09-15 DIAGNOSIS — R310 Gross hematuria: Secondary | ICD-10-CM | POA: Diagnosis not present

## 2020-09-15 DIAGNOSIS — D509 Iron deficiency anemia, unspecified: Secondary | ICD-10-CM

## 2020-09-15 DIAGNOSIS — R252 Cramp and spasm: Secondary | ICD-10-CM | POA: Diagnosis not present

## 2020-09-15 LAB — POCT URINALYSIS DIPSTICK
Bilirubin, UA: NEGATIVE
Glucose, UA: NEGATIVE
Ketones, UA: NEGATIVE
Leukocytes, UA: NEGATIVE
Nitrite, UA: NEGATIVE
Protein, UA: NEGATIVE
Spec Grav, UA: 1.015 (ref 1.010–1.025)
Urobilinogen, UA: 0.2 E.U./dL
pH, UA: 7.5 (ref 5.0–8.0)

## 2020-09-15 LAB — HEPATIC FUNCTION PANEL
ALT: 53 U/L — ABNORMAL HIGH (ref 0–35)
AST: 21 U/L (ref 0–37)
Albumin: 3.8 g/dL (ref 3.5–5.2)
Alkaline Phosphatase: 145 U/L — ABNORMAL HIGH (ref 39–117)
Bilirubin, Direct: 0 mg/dL (ref 0.0–0.3)
Total Bilirubin: 0.4 mg/dL (ref 0.2–1.2)
Total Protein: 6.9 g/dL (ref 6.0–8.3)

## 2020-09-15 LAB — CBC WITH DIFFERENTIAL/PLATELET
Basophils Absolute: 0 10*3/uL (ref 0.0–0.1)
Basophils Relative: 0.8 % (ref 0.0–3.0)
Eosinophils Absolute: 0.2 10*3/uL (ref 0.0–0.7)
Eosinophils Relative: 4.4 % (ref 0.0–5.0)
HCT: 38.3 % (ref 36.0–46.0)
Hemoglobin: 12.5 g/dL (ref 12.0–15.0)
Lymphocytes Relative: 34.6 % (ref 12.0–46.0)
Lymphs Abs: 1.9 10*3/uL (ref 0.7–4.0)
MCHC: 32.7 g/dL (ref 30.0–36.0)
MCV: 87.7 fl (ref 78.0–100.0)
Monocytes Absolute: 0.5 10*3/uL (ref 0.1–1.0)
Monocytes Relative: 9.7 % (ref 3.0–12.0)
Neutro Abs: 2.8 10*3/uL (ref 1.4–7.7)
Neutrophils Relative %: 50.5 % (ref 43.0–77.0)
Platelets: 314 10*3/uL (ref 150.0–400.0)
RBC: 4.37 Mil/uL (ref 3.87–5.11)
RDW: 13.5 % (ref 11.5–15.5)
WBC: 5.6 10*3/uL (ref 4.0–10.5)

## 2020-09-15 LAB — C-REACTIVE PROTEIN: CRP: 2 mg/dL (ref 0.5–20.0)

## 2020-09-15 LAB — BASIC METABOLIC PANEL
BUN: 6 mg/dL (ref 6–23)
CO2: 34 mEq/L — ABNORMAL HIGH (ref 19–32)
Calcium: 9.1 mg/dL (ref 8.4–10.5)
Chloride: 101 mEq/L (ref 96–112)
Creatinine, Ser: 0.66 mg/dL (ref 0.40–1.20)
GFR: 90.76 mL/min (ref >60.00)
Glucose, Bld: 90 mg/dL (ref 70–99)
Potassium: 3.6 mEq/L (ref 3.5–5.1)
Sodium: 141 mEq/L (ref 135–145)

## 2020-09-15 LAB — CK: Total CK: 67 U/L (ref 7–177)

## 2020-09-15 NOTE — Progress Notes (Signed)
Subjective:  Patient ID: Summer [name], female    DOB: 12-07-1950  Age: 70 y.o. MRN: [id number]  CC: Hematuria  This visit occurred during the SARS-CoV-2 public health emergency.  Safety protocols were in place, including screening questions prior to the visit, additional usage of staff PPE, and extensive cleaning of exam room while observing appropriate contact time as indicated for disinfecting solutions.    HPI Summer [name] presents for f/up - She was seen a few weeks ago by someone else for painless, gross hematuria.  She had RBC's and WBC's in the urine.  She has been treated with Keflex and nitrofurantoin.  She feels like the hematuria resolved about 2 days ago.  She denies dysuria, abdominal pain, or flank pain.  She has a myriad of other symptoms which sound like they are chronic in nature.  Outpatient Medications Prior to Visit  Medication Sig Dispense Refill   amoxicillin (AMOXIL) 500 MG capsule Take 4 pills one hour before procedure 12 capsule 2   augmented betamethasone dipropionate (DIPROLENE-AF) 0.05 % cream Apply 1 application topically daily as needed for itching.     diphenhydrAMINE (BENADRYL) 25 MG tablet Take 25 mg by mouth every 6 (six) hours as needed for itching, allergies or sleep.     docusate sodium (COLACE) 100 MG capsule Take 1 capsule (100 mg total) by mouth daily as needed. 30 capsule 2   exemestane (AROMASIN) 25 MG tablet TAKE 1 TABLET (25 MG TOTAL) BY MOUTH DAILY AFTER BREAKFAST. 90 tablet 1   gabapentin (NEURONTIN) 600 MG tablet TAKE 2 TABLETS BY MOUTH 3 TIMES A DAY. Please call [phone] to schedule an appt. 540 tablet 4   glycerin adult 2 g suppository Place 1 suppository rectally as needed for constipation. 12 suppository 0   hydrOXYzine (ATARAX/VISTARIL) 25 MG tablet Take 0.5 tablets (12.5 mg total) by mouth every 8 (eight) hours as needed for itching. 30 tablet 0   rivaroxaban (XARELTO) 10 MG TABS tablet Take 1 tablet (10 mg  total) by mouth daily. 35 tablet 0   traMADol (ULTRAM) 50 MG tablet Take 1-2 tabs po tid prn pain 30 tablet 0   cephALEXin (KEFLEX) 500 MG capsule Take 1 capsule (500 mg total) by mouth 2 (two) times daily. 14 capsule 0   nitrofurantoin, macrocrystal-monohydrate, (MACROBID) 100 MG capsule Take 1 capsule (100 mg total) by mouth 2 (two) times daily. 10 capsule 0   No facility-administered medications prior to visit.    ROS Review of Systems  Constitutional: Negative for appetite change, chills, diaphoresis, fatigue and fever.  HENT: Negative.   Eyes: Negative.   Respiratory: Negative for cough, chest tightness, shortness of breath and wheezing.   Cardiovascular: Negative for chest pain, palpitations and leg swelling.  Gastrointestinal: Positive for constipation. Negative for abdominal pain, blood in stool, diarrhea, nausea and vomiting.  Endocrine: Negative.   Genitourinary: Negative.  Negative for decreased urine volume, difficulty urinating, dysuria, hematuria and urgency.  Musculoskeletal: Positive for arthralgias and myalgias. Negative for neck pain.  Skin: Negative for rash.  Neurological: Positive for dizziness. Negative for weakness, light-headedness and headaches.  Hematological: Negative for adenopathy. Does not bruise/bleed easily.  Psychiatric/Behavioral: Negative.     Objective:  BP 116/72    Pulse 65    Temp 98.4 F (36.9 C) (Oral)    Ht 5' 6"  (1.676 m)    Wt 186 lb (84.4 kg)    SpO2 99%    BMI 30.02 kg/m   BP Readings from  Last 3 Encounters:  09/15/20 116/72  09/05/20 134/68  08/15/20 (!) 145/75    Wt Readings from Last 3 Encounters:  09/15/20 186 lb (84.4 kg)  09/05/20 184 lb 9.6 oz (83.7 kg)  08/15/20 182 lb 11.2 oz (82.9 kg)    Physical Exam Constitutional:      Appearance: Normal appearance.  HENT:     Nose: Nose normal.     Mouth/Throat:     Mouth: Mucous membranes are moist.  Eyes:     General: No scleral icterus.    Conjunctiva/sclera:  Conjunctivae normal.  Cardiovascular:     Rate and Rhythm: Normal rate and regular rhythm.     Heart sounds: No murmur heard.   Pulmonary:     Effort: Pulmonary effort is normal.     Breath sounds: No wheezing, rhonchi or rales.  Abdominal:     General: Abdomen is flat.     Palpations: There is no mass.     Tenderness: There is no abdominal tenderness. There is no guarding.  Musculoskeletal:        General: Normal range of motion.     Cervical back: Neck supple.     Right lower leg: No edema.  Lymphadenopathy:     Cervical: No cervical adenopathy.  Skin:    General: Skin is warm and dry.  Neurological:     General: No focal deficit present.     Mental Status: She is alert.  Psychiatric:        Mood and Affect: Mood normal.        Behavior: Behavior normal.     Lab Results  Component Value Date   WBC 5.6 09/15/2020   HGB 12.5 09/15/2020   HCT 38.3 09/15/2020   PLT 314.0 09/15/2020   GLUCOSE 90 09/15/2020   CHOL 193 02/26/2020   TRIG 46.0 02/26/2020   HDL 72.30 02/26/2020   LDLDIRECT 141.1 10/01/2013   LDLCALC 111 (H) 02/26/2020   ALT 53 (H) 09/15/2020   AST 21 09/15/2020   NA 141 09/15/2020   K 3.6 09/15/2020   CL 101 09/15/2020   CREATININE 0.66 09/15/2020   BUN 6 09/15/2020   CO2 34 (H) 09/15/2020   TSH 2.35 02/26/2020   INR 1.0 06/13/2020   HGBA1C 5.6 02/26/2020    MR LUMBAR SPINE WO CONTRAST  Result Date: 08/19/2020 [facility] NEUROIMAGING REPORT STUDY DATE: 08/18/20 PATIENT NAME: Summer [name] DOB: August 24, 1951 MRN: [id number] ORDERING CLINICIAN: Marcial Pacas, MD CLINICAL HISTORY: 70 year old female with low back pain. EXAM: MR LUMBAR SPINE WO CONTRAST TECHNIQUE: MRI of the lumbar spine was obtained utilizing 4 mm sagittal slices from V89-38 down to the lower sacrum with T1, T2 and inversion recovery views. In addition 4 mm axial slices from B0-1 down to L5-S1 level were included with T1 and T2 weighted views. CONTRAST: none  COMPARISON: none IMAGING SITE: [facility] [address] (1.5 Tesla MRI)  FINDINGS: On sagittal views the vertebral bodies have normal height and alignment. Degenerative spondylosis and disc bulging at L1-2, L3-4, L4-5 and L5-S1. The conus medullaris terminates at the level of L1.  On axial views: T12-L1: no spinal stenosis or foraminal narrowing L1-2: disc bulging and facet hypertrophy with moderate right foraminal stenosis L2-3: disc bulging and facet hypertrophy with no spinal stenosis or foraminal narrowing L3-4: disc bulging and facet hypertrophy with severe right and moderate left foraminal stenosis L4-5: disc bulging and facet hypertrophy with severe right and moderate left foraminal stenosis  L5-S1: disc bulging and facet hypertrophy with moderate left foraminal stenosis Limited views of the aorta, kidneys, iliopsoas muscles and sacroiliac joints are unremarkable.   MRI lumbar spine (without) demonstrating: - At L3-4, L4-5: disc bulging and facet hypertrophy with severe right and moderate left foraminal stenosis. - At L5-S1: disc bulging and facet hypertrophy with moderate left foraminal stenosis. - At L1-2: disc bulging and facet hypertrophy with moderate right foraminal stenosis. INTERPRETING PHYSICIAN: Penni Bombard, MD Certified in Neurology, Neurophysiology and Neuroimaging [facility] [address] [place], [place] [address] [phone]   09:09  (09/15/20) 10 d ago  (09/05/20) 3 mo ago  (06/13/20) 1 yr ago  (01/26/19) 1 yr ago  (10/12/18) 3 yr ago  (02/08/17) 4 yr ago  (02/21/16)    Color, UA  yellow  dark brown        Clarity, UA  clear          Glucose, UA Negative Negative  Negative        Bilirubin, UA  neg  negative        Ketones, UA  neg  negative        Spec Grav, UA 1.010 - 1.025 1.015  >=1.030Abnormal        Blood, UA  2+  3+        pH, UA 5.0 - 8.0 7.5  6.0        Protein, UA Negative Negative  PositiveAbnormal         Urobilinogen, UA 0.2 or 1.0 E.U./dL 0.2  1.0        Nitrite, UA  neg  negative        Leukocytes, UA Negative Negative  Small (1+)Abnormal    MODERATE        Assessment & Plan:   Summer [name] was seen today for hematuria.  Diagnoses and all orders for this visit:  Gross hematuria- All evidence of infection has resolved but she has persistent hematuria.  I recommended a CT renal protocol to screen for renal cell carcinoma, bladder cancer, and renal stones. -     CULTURE, URINE COMPREHENSIVE; Future -     POCT Urinalysis Dipstick -     CT RENAL STONE STUDY; Future -     CULTURE, URINE COMPREHENSIVE  Iron deficiency anemia, unspecified iron deficiency anemia type- Her H&H are normal now. -     CBC with Differential/Platelet; Future -     Basic metabolic panel; Future -     Basic metabolic panel -     CBC with Differential/Platelet  Cramps, muscle, general- Labs to screen for secondary causes of this are unremarkable. -     Basic metabolic panel; Future -     Hepatic function panel; Future -     CK; Future -     C-reactive protein; Future -     C-reactive protein -     CK -     Hepatic function panel -     Basic metabolic panel   I have discontinued Summer [name]'s cephALEXin and nitrofurantoin (macrocrystal-monohydrate). I am also having her maintain her hydrOXYzine, augmented betamethasone dipropionate, diphenhydrAMINE, rivaroxaban, docusate sodium, glycerin adult, traMADol, gabapentin, exemestane, and amoxicillin.  No orders of the defined types were placed in this encounter.    Follow-up: No follow-ups on file.  Scarlette Calico, MD

## 2020-09-17 LAB — CULTURE, URINE COMPREHENSIVE: RESULT:: NO GROWTH

## 2020-09-20 ENCOUNTER — Encounter: Payer: Federal, State, Local not specified - PPO | Admitting: Rehabilitative and Restorative Service Providers"

## 2020-09-21 ENCOUNTER — Other Ambulatory Visit: Payer: Self-pay

## 2020-09-21 ENCOUNTER — Ambulatory Visit (INDEPENDENT_AMBULATORY_CARE_PROVIDER_SITE_OTHER)
Admission: RE | Admit: 2020-09-21 | Discharge: 2020-09-21 | Disposition: A | Discharge status: Home / Self Care | Payer: Federal, State, Local not specified - PPO | Source: Ambulatory Visit | Attending: Internal Medicine | Admitting: Internal Medicine

## 2020-09-21 DIAGNOSIS — R310 Gross hematuria: Secondary | ICD-10-CM | POA: Diagnosis not present

## 2020-09-22 ENCOUNTER — Encounter: Payer: Self-pay | Admitting: Internal Medicine

## 2020-09-23 ENCOUNTER — Ambulatory Visit (AMBULATORY_SURGERY_CENTER): Payer: Self-pay

## 2020-09-23 ENCOUNTER — Other Ambulatory Visit: Payer: Self-pay

## 2020-09-23 VITALS — Ht 65.0 in | Wt 185.0 lb

## 2020-09-23 DIAGNOSIS — Z01818 Encounter for other preprocedural examination: Secondary | ICD-10-CM

## 2020-09-23 DIAGNOSIS — D509 Iron deficiency anemia, unspecified: Secondary | ICD-10-CM

## 2020-09-23 NOTE — Progress Notes (Signed)
No egg or soy allergy known to patient  No issues with past sedation with any surgeries or procedures--- reports difficulty waking up from "all of the medications";-- patient reports from last colonoscopy she "was given too much medication"; No intubation problems in the past  No FH of Malignant Hyperthermia No diet pills per patient No home 02 use per patient  No blood thinners per patient  Pt denies issues with constipation  No A fib or A flutter  EMMI video via [place] 19 guidelines implemented in PV today with Pt and RN  COVID screening scheduled on 10/10/2020 at 3:30 pm;    Due to the COVID-19 pandemic we are asking patients to follow certain guidelines.  Pt aware of COVID protocols and [facility] guidelines

## 2020-09-28 ENCOUNTER — Encounter: Payer: Self-pay | Admitting: Internal Medicine

## 2020-10-04 ENCOUNTER — Encounter: Payer: Self-pay | Admitting: Orthopaedic Surgery

## 2020-10-10 ENCOUNTER — Encounter: Payer: Self-pay | Admitting: Internal Medicine

## 2020-10-11 DIAGNOSIS — Z86010 Personal history of colonic polyps: Secondary | ICD-10-CM

## 2020-10-11 DIAGNOSIS — Z860101 Personal history of adenomatous and serrated colon polyps: Secondary | ICD-10-CM

## 2020-10-11 DIAGNOSIS — K2970 Gastritis, unspecified, without bleeding: Secondary | ICD-10-CM

## 2020-10-11 DIAGNOSIS — B9681 Helicobacter pylori [H. pylori] as the cause of diseases classified elsewhere: Secondary | ICD-10-CM

## 2020-10-11 HISTORY — DX: Personal history of colonic polyps: Z86.010

## 2020-10-11 HISTORY — DX: Personal history of adenomatous and serrated colon polyps: Z86.0101

## 2020-10-11 HISTORY — DX: Gastritis, unspecified, without bleeding: K29.70

## 2020-10-11 HISTORY — DX: Helicobacter pylori (H. pylori) as the cause of diseases classified elsewhere: B96.81

## 2020-10-12 ENCOUNTER — Other Ambulatory Visit: Payer: Self-pay

## 2020-10-12 ENCOUNTER — Ambulatory Visit (AMBULATORY_SURGERY_CENTER): Payer: Federal, State, Local not specified - PPO | Admitting: Internal Medicine

## 2020-10-12 ENCOUNTER — Encounter: Payer: Federal, State, Local not specified - PPO | Admitting: Internal Medicine

## 2020-10-12 ENCOUNTER — Encounter: Payer: Self-pay | Admitting: Internal Medicine

## 2020-10-12 VITALS — BP 140/62 | HR 71 | Temp 97.3°F | Resp 17 | Ht 65.0 in | Wt 185.0 lb

## 2020-10-12 DIAGNOSIS — K317 Polyp of stomach and duodenum: Secondary | ICD-10-CM

## 2020-10-12 DIAGNOSIS — D120 Benign neoplasm of cecum: Secondary | ICD-10-CM | POA: Diagnosis not present

## 2020-10-12 DIAGNOSIS — D509 Iron deficiency anemia, unspecified: Secondary | ICD-10-CM

## 2020-10-12 DIAGNOSIS — D125 Benign neoplasm of sigmoid colon: Secondary | ICD-10-CM | POA: Diagnosis not present

## 2020-10-12 DIAGNOSIS — K2970 Gastritis, unspecified, without bleeding: Secondary | ICD-10-CM

## 2020-10-12 DIAGNOSIS — K2950 Unspecified chronic gastritis without bleeding: Secondary | ICD-10-CM | POA: Diagnosis not present

## 2020-10-12 DIAGNOSIS — B9681 Helicobacter pylori [H. pylori] as the cause of diseases classified elsewhere: Secondary | ICD-10-CM | POA: Diagnosis not present

## 2020-10-12 MED ORDER — SODIUM CHLORIDE 0.9 % IV SOLN: 500.0000 mL | INTRAVENOUS | Status: DC

## 2020-10-12 NOTE — Patient Instructions (Addendum)
There was a tiny stomach polyp - I removed it. Took stomach biopsies to check for inflammation and infection.  Removed 3 tiny colon polyps. You also have a condition called diverticulosis - common and not usually a problem. Please read the handout provided.  I did not see any clear reason why blood count and iron were low.  This is not uncommon - many times iron can be low due to lack of iron in diet.  The biopsies will tell me more.  I did not see anything bad going on.  I appreciate the opportunity to care for you. Gatha Mayer, MD, [facility]   Discharge instructions given. Handouts on polyps and diverticulosis. Resume previous medications. YOU HAD AN ENDOSCOPIC PROCEDURE TODAY AT [facility]:   Refer to the procedure report that was given to you for any specific questions about what was found during the examination.  If the procedure report does not answer your questions, please call your gastroenterologist to clarify.  If you requested that your care partner not be given the details of your procedure findings, then the procedure report has been included in a sealed envelope for you to review at your convenience later.  YOU SHOULD EXPECT: Some feelings of bloating in the abdomen. Passage of more gas than usual.  Walking can help get rid of the air that was put into your GI tract during the procedure and reduce the bloating. If you had a lower endoscopy (such as a colonoscopy or flexible sigmoidoscopy) you may notice spotting of blood in your stool or on the toilet paper. If you underwent a bowel prep for your procedure, you may not have a normal bowel movement for a few days.  Please Note:  You might notice some irritation and congestion in your nose or some drainage.  This is from the oxygen used during your procedure.  There is no need for concern and it should clear up in a day or so.  SYMPTOMS TO REPORT IMMEDIATELY:   Following lower endoscopy (colonoscopy or  flexible sigmoidoscopy):  Excessive amounts of blood in the stool  Significant tenderness or worsening of abdominal pains  Swelling of the abdomen that is new, acute  Fever of 100F or higher   Following upper endoscopy (EGD)  Vomiting of blood or coffee ground material  New chest pain or pain under the shoulder blades  Painful or persistently difficult swallowing  New shortness of breath  Fever of 100F or higher  Black, tarry-looking stools  For urgent or emergent issues, a gastroenterologist can be reached at any hour by calling [phone]. Do not use MyChart messaging for urgent concerns.    DIET:  We do recommend a small meal at first, but then you may proceed to your regular diet.  Drink plenty of fluids but you should avoid alcoholic beverages for 24 hours.  ACTIVITY:  You should plan to take it easy for the rest of today and you should NOT DRIVE or use heavy machinery until tomorrow (because of the sedation medicines used during the test).    FOLLOW UP: Our staff will call the number listed on your records 48-72 hours following your procedure to check on you and address any questions or concerns that you may have regarding the information given to you following your procedure. If we do not reach you, we will leave a message.  We will attempt to reach you two times.  During this call, we will ask if you have  developed any symptoms of COVID 19. If you develop any symptoms (ie: fever, flu-like symptoms, shortness of breath, cough etc.) before then, please call [phone].  If you test positive for Covid 19 in the 2 weeks post procedure, please call and report this information to [place].    If any biopsies were taken you will be contacted by phone or by letter within the next 1-3 weeks.  Please call us at [phone] if you have not heard about the biopsies in 3 weeks.    SIGNATURES/CONFIDENTIALITY: You and/or your care partner have signed paperwork which will be entered into  your electronic medical record.  These signatures attest to the fact that that the information above on your After Visit Summary has been reviewed and is understood.  Full responsibility of the confidentiality of this discharge information lies with you and/or your care-partner.

## 2020-10-12 NOTE — Op Note (Signed)
[facility] Patient Name: Summer [name] Procedure Date: 10/12/2020 2:10 PM MRN: [id number] Endoscopist: Gatha Mayer , MD Age: 70 Referring MD:  Date of Birth: 1950/09/12 Gender: Female Account #: [id number] Procedure:                Upper GI endoscopy Indications:              Iron deficiency anemia Medicines:                Propofol per Anesthesia, Monitored Anesthesia Care Procedure:                Pre-Anesthesia Assessment:                           - Prior to the procedure, a History and Physical                            was performed, and patient medications and                            allergies were reviewed. The patient's tolerance of                            previous anesthesia was also reviewed. The risks                            and benefits of the procedure and the sedation                            options and risks were discussed with the patient.                            All questions were answered, and informed consent                            was obtained. Prior Anticoagulants: The patient has                            taken no previous anticoagulant or antiplatelet                            agents. ASA Grade Assessment: II - A patient with                            mild systemic disease. After reviewing the risks                            and benefits, the patient was deemed in                            satisfactory condition to undergo the procedure.                           After obtaining informed consent, the endoscope was  passed under direct vision. Throughout the                            procedure, the patient's blood pressure, pulse, and                            oxygen saturations were monitored continuously. The                            Endoscope was introduced through the mouth, and                            advanced to the second part of duodenum. The upper                            GI  endoscopy was accomplished without difficulty.                            The patient tolerated the procedure well. Scope In: Scope Out: Findings:                 A single 1 to 2 mm sessile polyp with stigmata of                            recent bleeding was found in the prepyloric region                            of the stomach. The polyp was removed with a cold                            biopsy forceps. Resection and retrieval were                            complete. Verification of patient identification                            for the specimen was done. Estimated blood loss was                            minimal.                           Diffuse mild mucosal changes characterized by                            erythema, friability (with contact bleeding) and                            sloughing were found in the entire examined                            stomach. Biopsies were taken with a cold forceps  for histology. Verification of patient                            identification for the specimen was done. Estimated                            blood loss was minimal.                           The exam was otherwise without abnormality.                           The cardia and gastric fundus were normal on                            retroflexion.                           The gastroesophageal flap valve was visualized                            endoscopically and classified as Hill Grade I                            (prominent fold, tight to endoscope). Complications:            No immediate complications. Estimated Blood Loss:     Estimated blood loss was minimal. Impression:               - A single gastric polyp. Resected and retrieved. ?                            if some bleeding from here but seems unlikely to                            cause iron deficiency anemia                           - Erythematous and friable (with contact bleeding)                             mucosa in the stomach. Biopsied.                           - The examination was otherwise normal.                           - Gastroesophageal flap valve classified as Hill                            Grade I (prominent fold, tight to endoscope). Recommendation:           - Patient has a contact number available for                            emergencies. The signs and symptoms of potential  delayed complications were discussed with the                            patient. Return to normal activities tomorrow.                            Written discharge instructions were provided to the                            patient.                           - Resume previous diet.                           - Continue present medications.                           - See the other procedure note for documentation of                            additional recommendations.                           - Await pathology results. Gatha Mayer, MD 10/12/2020 2:54:37 PM This report has been signed electronically.

## 2020-10-12 NOTE — Progress Notes (Signed)
1419 Robinul 0.1 mg IV given due large amount of secretions upon assessment.  MD made aware, vss  °

## 2020-10-12 NOTE — Op Note (Signed)
[facility] Patient Name: Summer [name] Procedure Date: 10/12/2020 2:10 PM MRN: [id number] Endoscopist: Gatha Mayer , MD Age: 70 Referring MD:  Date of Birth: 04/04/1951 Gender: Female Account #: [id number] Procedure:                Colonoscopy Indications:              Iron deficiency anemia Medicines:                Propofol per Anesthesia, Monitored Anesthesia Care Procedure:                Pre-Anesthesia Assessment:                           - Prior to the procedure, a History and Physical                            was performed, and patient medications and                            allergies were reviewed. The patient's tolerance of                            previous anesthesia was also reviewed. The risks                            and benefits of the procedure and the sedation                            options and risks were discussed with the patient.                            All questions were answered, and informed consent                            was obtained. Prior Anticoagulants: The patient has                            taken no previous anticoagulant or antiplatelet                            agents. ASA Grade Assessment: II - A patient with                            mild systemic disease. After reviewing the risks                            and benefits, the patient was deemed in                            satisfactory condition to undergo the procedure.                           After obtaining informed consent, the colonoscope  was passed under direct vision. Throughout the                            procedure, the patient's blood pressure, pulse, and                            oxygen saturations were monitored continuously. The                            Olympus PFC-[id number]) Colonoscope was                            introduced through the anus and advanced to the the                            terminal  ileum, with identification of the                            appendiceal orifice and IC valve. The colonoscopy                            was performed without difficulty. The patient                            tolerated the procedure well. The quality of the                            bowel preparation was good. The bowel preparation                            used was Miralax via split dose instruction. The                            ileocecal valve, appendiceal orifice, and rectum                            were photographed. Scope In: 2:29:35 PM Scope Out: 2:43:44 PM Scope Withdrawal Time: 0 hours 10 minutes 55 seconds  Total Procedure Duration: 0 hours 14 minutes 9 seconds  Findings:                 The perianal and digital rectal examinations were                            normal.                           The terminal ileum appeared normal.                           Three sessile polyps were found in the sigmoid                            colon and cecum. The polyps were diminutive in  size. These polyps were removed with a cold snare.                            Resection and retrieval were complete. Verification                            of patient identification for the specimen was                            done. Estimated blood loss was minimal.                           Many small and large-mouthed diverticula were found                            in the sigmoid colon.                           The exam was otherwise without abnormality on                            direct and retroflexion views. Complications:            No immediate complications. Estimated Blood Loss:     Estimated blood loss was minimal. Impression:               - The examined portion of the ileum was normal.                           - Three diminutive polyps in the sigmoid colon and                            in the cecum, removed with a cold snare. Resected                             and retrieved.                           - Diverticulosis in the sigmoid colon.                           - The examination was otherwise normal on direct                            and retroflexion views.                           - No cause of iron-deficiency found Recommendation:           - Patient has a contact number available for                            emergencies. The signs and symptoms of potential                            delayed complications were  discussed with the                            patient. Return to normal activities tomorrow.                            Written discharge instructions were provided to the                            patient.                           - Resume previous diet.                           - Continue present medications.                           - Await pathology results.                           - Repeat colonoscopy is recommended. The                            colonoscopy date will be determined after pathology                            results from today's exam become available for                            review.                           - See the other procedure note for documentation of                            additional recommendations. Gatha Mayer, MD 10/12/2020 2:58:29 PM This report has been signed electronically.

## 2020-10-12 NOTE — Progress Notes (Signed)
Called to room to assist during endoscopic procedure.  Patient ID and intended procedure confirmed with present staff. Received instructions for my participation in the procedure from the performing physician.  

## 2020-10-12 NOTE — Progress Notes (Signed)
Vs SM Pt's states no medical or surgical changes since previsit or office visit. 

## 2020-10-12 NOTE — Progress Notes (Signed)
Report given to PACU, vss 

## 2020-10-13 ENCOUNTER — Ambulatory Visit: Payer: Self-pay

## 2020-10-13 ENCOUNTER — Ambulatory Visit (INDEPENDENT_AMBULATORY_CARE_PROVIDER_SITE_OTHER): Payer: Federal, State, Local not specified - PPO | Admitting: Orthopaedic Surgery

## 2020-10-13 DIAGNOSIS — Z96652 Presence of left artificial knee joint: Secondary | ICD-10-CM

## 2020-10-13 MED ORDER — DICLOFENAC SODIUM 1 % EX GEL: 2.0000 g | Freq: Four times a day (QID) | CUTANEOUS | 0 refills | Status: AC

## 2020-10-13 NOTE — Progress Notes (Signed)
Post-Op Visit Note   Patient: Summer [name]           Date of Birth: [birth year]           MRN: [id number] Visit Date: 10/13/2020 PCP: Hoyt Koch, MD   Assessment & Plan:  Chief Complaint:  Chief Complaint  Patient presents with  . Left Knee - Pain   Visit Diagnoses:  1. Status post total left knee replacement     Plan: Patient is a pleasant 70 year old female who is little less than 4 months out left total knee replacement who comes in today with concerns about her left knee.  A few weeks ago after returning to work, she bumped the medial and lateral aspects of her left knee on a metal bar on her desk.  She has had increased pain and sensitivity since.  She comes in today for further evaluation.  The pain she has is primarily to touch.  Examination of the left knee reveals a fully healed surgical scar without complication.  She does have a keloid.  She still lacks a few degrees of extension but is able to flex to about 120 degrees.  She has tenderness throughout the entire knee.  She is stable valgus varus stress.  X-rays today are unremarkable.  I believe she contused the left knee.  I recommended ice and topical anti-inflammatories.  She did have a recent colonoscopy to look for possible cause of anemia so she is not interested in taking NSAIDs right now.  She will follow up with [place] in 2 months time when she is 6 months out for surgery for repeat evaluation and 2 view x-rays of the left knee.  Call with concerns or questions.  Dental prophylaxis reinforced.  Follow-Up Instructions: Return in about 2 months (around 12/11/2020).   Orders:  Orders Placed This Encounter  Procedures  . XR KNEE 3 VIEW LEFT   Meds ordered this encounter  Medications  . diclofenac Sodium (VOLTAREN) 1 % GEL    Sig: Apply 2 g topically 4 (four) times daily.    Dispense:  150 g    Refill:  0    Imaging: XR KNEE 3 VIEW LEFT  Result Date: 10/13/2020 Well-seated prosthesis without  complication.  No evidence of loosening or fracture.   PMFS History: Patient Active Problem List   Diagnosis Date Noted  . Chronic left-sided low back pain with left-sided sciatica 08/10/2020  . Left hip pain 08/10/2020  . Status post total left knee replacement 06/20/2020  . Primary osteoarthritis of left knee 06/19/2020  . Left knee pain 02/26/2020  . Bilateral arm numbness and tingling while sleeping 02/26/2020  . COVID-19 11/06/2019  . Muscle cramps 05/12/2019  . Cramps, muscle, general 04/23/2019  . Tinnitus 04/16/2019  . Chest pain of uncertain etiology 42/70/6237  . Bilateral leg edema 02/10/2019  . Port-A-Cath in place 01/30/2019  . Anemia, iron deficiency 11/21/2018  . Malignant neoplasm of upper-outer quadrant of right breast in female, estrogen receptor positive ([place]) 11/17/2018  . Itching 10/13/2018  . Left eye pain 07/01/2018  . Constipation 07/01/2018  . Weight loss 07/22/2017  . Memory loss 12/21/2016  . Tremor 12/21/2016  . Right sided temporal headache 12/21/2016  . Trochanteric bursitis, right hip 10/26/2016  . Pruritus 10/26/2016  . Foot sprain, left, initial encounter 08/31/2016  . Pain of right side of body 08/31/2016  . Headache 04/24/2016  . LLQ pain 04/24/2016  . Glossitis 02/02/2016  . Routine general medical examination  at a health care facility 10/27/2015  . Facial pain 10/06/2015  . Trigeminal neuralgia of right side of face 11/30/2010  . Morbid obesity ([place]) 11/09/2010  . Fibromyalgia 11/09/2010  . LOC OSTEOARTHROS NOT SPEC PRIM/SEC OTH [facility] SITE 06/27/2009  . Allergic rhinitis 08/26/2008  . 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 Abdominal pain, left lower quadrant 11/09/2009   Qualifier: Diagnosis of  By: Valma Cava LPN, Izora Gala    . Allergic rhinitis 08/26/2008   Qualifier: Diagnosis of  By: Sherren Mocha MD, Jory Ee   . Anemia, iron deficiency 11/21/2018  . Arthritis shoulders and back  . BACK  PAIN, CHRONIC 06/13/2007   Qualifier: Diagnosis of  By: Sherren Mocha MD, Jory Ee   . 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 Cataract    bilateral - no sx as of 09/23/2020)  . 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 Complication of anesthesia    Pt states having some "difficulty waking up"  . Constipation 07/01/2018  . Costochondritis 05/04/2014  . Diverticulitis of colon 06/13/2007   S/p segmental colectomy for perforated diverticulitis   . Diverticulosis   . Facial pain 10/06/2015  . Fibromyalgia   . Foot sprain, left, initial encounter 08/31/2016  . Glossitis 02/02/2016  . Headache 04/24/2016  . HEMATURIA UNSPECIFIED 06/25/2008   Qualifier: Diagnosis of  By: Sherren Mocha MD, Jory Ee   . History of kidney stones   . History of shingles 2012--  no residual pain  . Itching 10/13/2018  . Left eye pain 07/01/2018  . Left ureteral calculus   . LOC OSTEOARTHROS NOT SPEC PRIM/SEC OTH [facility] SITE 06/27/2009   Qualifier: Diagnosis of  By: Sherren Mocha MD, Jory Ee   . Malignant neoplasm of upper-outer quadrant of right breast in female, estrogen receptor positive ([place]) 11/17/2018  . MCI (mild cognitive impairment)   . Memory loss 12/21/2016  . Morbid obesity ([place]) 11/09/2010  . Pain of right side of body 08/31/2016  . Personal history of chemotherapy   . Personal history of radiation therapy   . Pruritus 10/26/2016  . Renal disorder    hx of  . Right sided temporal headache 12/21/2016  . Tremor 12/21/2016  . Trigeminal neuralgia RIGHT  . Trigeminal neuralgia of right side of face 11/30/2010  . Trochanteric bursitis, right hip 10/26/2016  . Urgency of urination   . Weight loss 07/22/2017  . Zoster without complications 2/35/5732    Family History  Problem Relation Age of Onset  . Heart disease Maternal Grandmother   . Diabetes Mother   . Breast cancer Maternal Aunt   . Lupus Cousin   . Colon cancer Neg Hx   . Esophageal cancer Neg Hx   . Rectal cancer Neg Hx   . Stomach cancer Neg Hx   . Colon  polyps Neg Hx     Past Surgical History:  Procedure Laterality Date  . BREAST BIOPSY Right 11/11/2018  . BREAST LUMPECTOMY Right 11/28/2018  . BREAST LUMPECTOMY WITH RADIOACTIVE SEED AND SENTINEL LYMPH NODE BIOPSY Right 11/28/2018   Procedure: RIGHT BREAST LUMPECTOMY WITH RADIOACTIVE SEED AND RIGHT AXILLARY SENTINEL LYMPH NODE BIOPSY;  Surgeon: Excell Seltzer, MD;  Location: [facility];  Service: General;  Laterality: Right;  . CEREBRAL MICROVASCULAR DECOMPRESSION  07-05-2006   RIGHT TRIGEMINAL NERVE  . COLONOSCOPY  2013   CG-MAC-moviprep (exc)-normal -mild tics  . CYSTOSCOPY/RETROGRADE/URETEROSCOPY/STONE EXTRACTION WITH BASKET  07/04/2012   Procedure: CYSTOSCOPY/RETROGRADE/URETEROSCOPY/STONE EXTRACTION WITH BASKET;  Surgeon: Claybon Jabs, MD;  Location: [facility];  Service: Urology;  Laterality: Left;  . EXPLORATORY LAPAROTOMY/ RESECTION MID TO DISTAL SIGMOID AND PROXIMAL RECTUM/ END PROXIMAL SIGMOID COLOSTOMY  07-17-2006   PERFORATED DIVERTICULITIS WITH PERITONITIS  . gamma knife  05/17/2016   for trigeminal neuralgia, [facility], Dr Salomon Fick  . gamma knife  2019  . IR CV LINE INJECTION  10/27/2019  . PORTACATH PLACEMENT Left 11/28/2018   Procedure: INSERTION PORT-A-CATH WITH ULTRASOUND;  Surgeon: Excell Seltzer, MD;  Location: [facility];  Service: General;  Laterality: Left;  . portacath removal    . RESECTION COLOSTOMY/ CLOSURE COLOSTOMY WITH COLOPROCTOSTOMY  02-20-2007  . TONSILLECTOMY  AS CHILD  . TOTAL KNEE ARTHROPLASTY Left 06/20/2020   Procedure: LEFT TOTAL KNEE ARTHROPLASTY;  Surgeon: Leandrew Koyanagi, MD;  Location: [facility];  Service: Orthopedics;  Laterality: Left;  . URETEROSCOPY  07/04/2012   Procedure: URETEROSCOPY;  Surgeon: Claybon Jabs, MD;  Location: [facility];  Service: Urology;  Laterality: Left;  Marland Kitchen VAGINAL HYSTERECTOMY  1998   Partial  . WISDOM TOOTH EXTRACTION     Social History   Occupational History    Employer: UNEMPLOYED  .  Occupation: HUMAN RESOURCES     Employer: [employer]  Tobacco Use  . Smoking status: Never Smoker  . Smokeless tobacco: Never Used  Vaping Use  . Vaping Use: Never used  Substance and Sexual Activity  . Alcohol use: Yes    Comment: rarely  . Drug use: No  . Sexual activity: Not on file

## 2020-10-14 ENCOUNTER — Telehealth: Payer: Self-pay

## 2020-10-14 NOTE — Telephone Encounter (Signed)
LVM

## 2020-10-20 ENCOUNTER — Encounter: Payer: Self-pay | Admitting: Internal Medicine

## 2020-10-21 ENCOUNTER — Other Ambulatory Visit: Payer: Self-pay

## 2020-10-21 DIAGNOSIS — A048 Other specified bacterial intestinal infections: Secondary | ICD-10-CM

## 2020-10-21 MED ORDER — DOXYCYCLINE HYCLATE 100 MG PO CAPS: 100.0000 mg | ORAL_CAPSULE | Freq: Two times a day (BID) | ORAL | 0 refills | Status: AC

## 2020-10-21 MED ORDER — BISMUTH SUBSALICYLATE 262 MG PO CHEW: 524.0000 mg | CHEWABLE_TABLET | Freq: Four times a day (QID) | ORAL | 0 refills | Status: AC

## 2020-10-21 MED ORDER — OMEPRAZOLE 20 MG PO CPDR: 20.0000 mg | DELAYED_RELEASE_CAPSULE | Freq: Two times a day (BID) | ORAL | 0 refills | Status: DC

## 2020-10-21 MED ORDER — METRONIDAZOLE 250 MG PO TABS: 250.0000 mg | ORAL_TABLET | Freq: Four times a day (QID) | ORAL | 0 refills | Status: AC

## 2020-10-24 ENCOUNTER — Ambulatory Visit: Payer: Federal, State, Local not specified - PPO | Admitting: Internal Medicine

## 2020-10-24 ENCOUNTER — Telehealth: Payer: Self-pay | Admitting: Internal Medicine

## 2020-10-24 NOTE — Telephone Encounter (Signed)
Inbound call from patient returning your call in regards to her results and medication that she was prescribed.

## 2020-10-24 NOTE — Telephone Encounter (Signed)
See results notes on pathology for details

## 2020-11-03 ENCOUNTER — Encounter: Payer: Self-pay | Admitting: Internal Medicine

## 2020-11-03 DIAGNOSIS — R310 Gross hematuria: Secondary | ICD-10-CM

## 2020-11-04 ENCOUNTER — Telehealth: Payer: Self-pay | Admitting: Internal Medicine

## 2020-11-04 NOTE — Telephone Encounter (Signed)
Onset last week she has started having nausea, vomiting, dizzy , voice changes where her voice goes up and down and a headache. No sore throat, no fever. She has 4 days left of her H. Pylori treatment left. She didn't start it right away. Her stools are blackish, brownish and like "corn meal" 3 to 4 movements a day, large amounts. Please advise Sir, thank you.

## 2020-11-04 NOTE — Telephone Encounter (Signed)
Inbound call from patient requesting a call back from a nurse please.  Since starting on the medications that we prescribed to her, she has been experiencing headaches, dizziness, nausea and spasms on her voice box.

## 2020-11-04 NOTE — Telephone Encounter (Signed)
I called Mrs Mccarrell to let her know that I haven't heard back yet from Dr Carlean Purl as he is working at the hospital. I told her to hold off taking any more of the medicines until we hear from him.

## 2020-11-07 NOTE — Telephone Encounter (Signed)
Pt is returning a missed call.

## 2020-11-07 NOTE — Telephone Encounter (Signed)
Mrs. Franko called me back. She is better, the dizziness, voice changes, nausea and vomiting are all better. No fever. She is having 2-3 bowel movements a day and they are not dark. She said they look like baby poop. Please advise Sir, thank you.

## 2020-11-07 NOTE — Telephone Encounter (Signed)
Holding meds right thing Please call her and recheck

## 2020-11-07 NOTE — Telephone Encounter (Signed)
Left her a message to call me back on her voicemail.

## 2020-11-07 NOTE — Telephone Encounter (Signed)
We need to see if the medication took care of the H. pylori infection.  She should be off all of the antibiotics the Pepto-Bismol and the omeprazole and she should submit a stool specimen for H. pylori in about 4 weeks as previously discussed through [facility]  See pathology result note from 10/12/2020 for further details

## 2020-11-08 NOTE — Telephone Encounter (Signed)
Patient informed and will come get the container to have on hand and use it after 4 weeks off the medicine.

## 2020-11-10 ENCOUNTER — Encounter: Payer: Self-pay | Admitting: Internal Medicine

## 2020-11-18 ENCOUNTER — Other Ambulatory Visit: Payer: Self-pay

## 2020-11-18 ENCOUNTER — Ambulatory Visit: Payer: Federal, State, Local not specified - PPO | Admitting: Internal Medicine

## 2020-11-18 ENCOUNTER — Encounter: Payer: Self-pay | Admitting: Hematology

## 2020-11-18 ENCOUNTER — Encounter: Payer: Self-pay | Admitting: Internal Medicine

## 2020-11-18 VITALS — BP 126/68 | HR 76 | Temp 99.6°F | Resp 18 | Ht 65.0 in | Wt 186.8 lb

## 2020-11-18 DIAGNOSIS — R200 Anesthesia of skin: Secondary | ICD-10-CM | POA: Diagnosis not present

## 2020-11-18 DIAGNOSIS — D509 Iron deficiency anemia, unspecified: Secondary | ICD-10-CM

## 2020-11-18 DIAGNOSIS — G629 Polyneuropathy, unspecified: Secondary | ICD-10-CM | POA: Diagnosis not present

## 2020-11-18 DIAGNOSIS — N200 Calculus of kidney: Secondary | ICD-10-CM

## 2020-11-18 DIAGNOSIS — R5383 Other fatigue: Secondary | ICD-10-CM

## 2020-11-18 DIAGNOSIS — R202 Paresthesia of skin: Secondary | ICD-10-CM

## 2020-11-18 NOTE — Assessment & Plan Note (Addendum)
She did have diverticulosis on colonoscopy which could explain some blood loss. She just was partially treated for h pylori and advised her to follow up with GI regarding if this is cleared. We did also talk about how if GI thinks this is not the cause there are other tests which could be done to assess small intestines.

## 2020-11-18 NOTE — Assessment & Plan Note (Signed)
Checking labs for neuropathy and ana per patient request although she does not have typical signs of lupus.

## 2020-11-18 NOTE — Patient Instructions (Signed)
We are checking the labs today and will call you back with the results.

## 2020-11-18 NOTE — Progress Notes (Signed)
   Subjective:   Patient ID: Summer [name], female    DOB: 1951/03/21, 70 y.o.   MRN: [id number]  HPI The patient is a 70 YO female coming in for several concerns including her numbness in the hands and feet (concerned about lupus, has prior chemo, denies taking anything for this, is taking medication for her trigeminal neuralgia but this does not help), and concerns about her iron deficiency anemia (she states that no one knows what is going on with this, she states GI did not find anything with recent colonoscopy and endoscopy except bacteria in her stomach and the medicine made her sick) and some concerns about her kidneys (states she was told she may have kidney cancer, recent CT abdomen reviewed with patient and no evidence of renal cancer, there is a renal cyst and some kidney stones, she has upcoming apt with urology).   Review of Systems  Constitutional: Positive for activity change and fatigue.  HENT: Negative.   Eyes: Negative.   Respiratory: Negative for cough, chest tightness and shortness of breath.   Cardiovascular: Negative for chest pain, palpitations and leg swelling.  Gastrointestinal: Negative for abdominal distention, abdominal pain, constipation, diarrhea, nausea and vomiting.  Musculoskeletal: Negative.   Skin: Negative.   Neurological: Positive for numbness.  Psychiatric/Behavioral: Negative.     Objective:  Physical Exam Constitutional:      Appearance: She is well-developed.  HENT:     Head: Normocephalic and atraumatic.  Cardiovascular:     Rate and Rhythm: Normal rate and regular rhythm.  Pulmonary:     Effort: Pulmonary effort is normal. No respiratory distress.     Breath sounds: Normal breath sounds. No wheezing or rales.  Abdominal:     General: Bowel sounds are normal. There is no distension.     Palpations: Abdomen is soft.     Tenderness: There is no abdominal tenderness. There is no rebound.  Musculoskeletal:        General: Tenderness  present.     Cervical back: Normal range of motion.  Skin:    General: Skin is warm and dry.  Neurological:     Mental Status: She is alert and oriented to person, place, and time.     Coordination: Coordination normal.     Vitals:   11/18/20 1559  BP: 126/68  Pulse: 76  Resp: 18  Temp: 99.6 F (37.6 C)  TempSrc: Oral  SpO2: 98%  Weight: 186 lb 12.8 oz (84.7 kg)  Height: 5\' 5"  (1.651 m)    This visit occurred during the SARS-CoV-2 public health emergency.  Safety protocols were in place, including screening questions prior to the visit, additional usage of staff PPE, and extensive cleaning of exam room while observing appropriate contact time as indicated for disinfecting solutions.   Assessment & Plan:

## 2020-11-18 NOTE — Assessment & Plan Note (Signed)
Reviewed CT imaging from this year with her today and no evidence for renal cancer. She does have some stones. She has prior cystoscopy for intermittent microscopic hematuria. Advised to keep visit with urology for recent episode of hematuria.

## 2020-11-23 ENCOUNTER — Encounter: Payer: Self-pay | Admitting: Internal Medicine

## 2020-11-29 LAB — CBC
HCT: 42.7 % (ref 35.0–45.0)
Hemoglobin: 13.7 g/dL (ref 11.7–15.5)
MCH: 28.6 pg (ref 27.0–33.0)
MCHC: 32.1 g/dL (ref 32.0–36.0)
MCV: 89.1 fL (ref 80.0–100.0)
MPV: 10.3 fL (ref 7.5–12.5)
Platelets: 266 10*3/uL (ref 140–400)
RBC: 4.79 10*6/uL (ref 3.80–5.10)
RDW: 13 % (ref 11.0–15.0)
WBC: 5.6 10*3/uL (ref 3.8–10.8)

## 2020-11-29 LAB — COMPREHENSIVE METABOLIC PANEL
AG Ratio: 1.5 (calc) (ref 1.0–2.5)
ALT: 7 U/L (ref 6–29)
AST: 16 U/L (ref 10–35)
Albumin: 4.2 g/dL (ref 3.6–5.1)
Alkaline phosphatase (APISO): 113 U/L (ref 37–153)
BUN: 10 mg/dL (ref 7–25)
CO2: 28 mmol/L (ref 20–32)
Calcium: 9.7 mg/dL (ref 8.6–10.4)
Chloride: 103 mmol/L (ref 98–110)
Creat: 0.83 mg/dL (ref 0.50–0.99)
Globulin: 2.8 g/dL (calc) (ref 1.9–3.7)
Glucose, Bld: 79 mg/dL (ref 65–99)
Potassium: 4.1 mmol/L (ref 3.5–5.3)
Sodium: 141 mmol/L (ref 135–146)
Total Bilirubin: 0.9 mg/dL (ref 0.2–1.2)
Total Protein: 7 g/dL (ref 6.1–8.1)

## 2020-11-29 LAB — VITAMIN D 25 HYDROXY (VIT D DEFICIENCY, FRACTURES): Vit D, 25-Hydroxy: 29 ng/mL — ABNORMAL LOW (ref 30–100)

## 2020-11-29 LAB — ANA SCREEN,IFA,REFLEX TITER/PATTERN,REFLEX MPLX 11 AB CASCADE
14-3-3 eta Protein: <0.2 ng/mL (ref <0.2)
Anti Nuclear Antibody (ANA): NEGATIVE
Cyclic Citrullin Peptide Ab: <16 UNITS
Rheumatoid fact SerPl-aCnc: <14 IU/mL (ref <14)

## 2020-11-29 LAB — HEMOGLOBIN A1C
Hgb A1c MFr Bld: 5.3 % of total Hgb (ref <5.7)
Mean Plasma Glucose: 105 mg/dL
eAG (mmol/L): 5.8 mmol/L

## 2020-11-29 LAB — VITAMIN B12: Vitamin B-12: 452 pg/mL (ref 200–1100)

## 2020-11-29 LAB — TSH: TSH: 1.17 mIU/L (ref 0.40–4.50)

## 2020-12-06 ENCOUNTER — Other Ambulatory Visit: Payer: Federal, State, Local not specified - PPO

## 2020-12-06 DIAGNOSIS — A048 Other specified bacterial intestinal infections: Secondary | ICD-10-CM

## 2020-12-09 ENCOUNTER — Ambulatory Visit
Admission: RE | Admit: 2020-12-09 | Discharge: 2020-12-09 | Disposition: A | Discharge status: Home / Self Care | Payer: Federal, State, Local not specified - PPO | Source: Ambulatory Visit | Attending: Hematology | Admitting: Hematology

## 2020-12-09 ENCOUNTER — Other Ambulatory Visit: Payer: Self-pay

## 2020-12-09 DIAGNOSIS — Z170 Estrogen receptor positive status [ER+]: Secondary | ICD-10-CM

## 2020-12-22 ENCOUNTER — Ambulatory Visit (INDEPENDENT_AMBULATORY_CARE_PROVIDER_SITE_OTHER): Payer: Federal, State, Local not specified - PPO

## 2020-12-22 ENCOUNTER — Other Ambulatory Visit: Payer: Self-pay

## 2020-12-22 ENCOUNTER — Encounter: Payer: Self-pay | Admitting: Sports Medicine

## 2020-12-22 ENCOUNTER — Ambulatory Visit (INDEPENDENT_AMBULATORY_CARE_PROVIDER_SITE_OTHER): Payer: Federal, State, Local not specified - PPO | Admitting: Sports Medicine

## 2020-12-22 DIAGNOSIS — M19079 Primary osteoarthritis, unspecified ankle and foot: Secondary | ICD-10-CM

## 2020-12-22 DIAGNOSIS — M21619 Bunion of unspecified foot: Secondary | ICD-10-CM | POA: Diagnosis not present

## 2020-12-22 DIAGNOSIS — R252 Cramp and spasm: Secondary | ICD-10-CM

## 2020-12-22 DIAGNOSIS — M2041 Other hammer toe(s) (acquired), right foot: Secondary | ICD-10-CM | POA: Diagnosis not present

## 2020-12-22 DIAGNOSIS — M2042 Other hammer toe(s) (acquired), left foot: Secondary | ICD-10-CM | POA: Diagnosis not present

## 2020-12-22 DIAGNOSIS — M79672 Pain in left foot: Secondary | ICD-10-CM

## 2020-12-22 DIAGNOSIS — G620 Drug-induced polyneuropathy: Secondary | ICD-10-CM

## 2020-12-22 DIAGNOSIS — M79671 Pain in right foot: Secondary | ICD-10-CM

## 2020-12-22 DIAGNOSIS — T451X5A Adverse effect of antineoplastic and immunosuppressive drugs, initial encounter: Secondary | ICD-10-CM

## 2020-12-22 MED ORDER — DICLOFENAC SODIUM 75 MG PO TBEC: 75.0000 mg | DELAYED_RELEASE_TABLET | Freq: Two times a day (BID) | ORAL | 0 refills | Status: DC

## 2020-12-22 NOTE — Progress Notes (Signed)
Subjective: Summer [name] is a 70 y.o. female patient who presents to office for evaluation of left greater than right foot pain reports that the pain has worsened over her toe joints especially the big toe joint and underneath and at the second toe with cramping and swelling reports that she did have knee replacement and after this started experiencing more pain.  Patient also admits to a history of chemo and afterwards developing some numbness and tingling to her toes.  Has trigeminal neuralgia and currently on gabapentin.  No other pedal complaints noted.  Patient Active Problem List   Diagnosis Date Noted  . Kidney stones 11/18/2020  . Chronic left-sided low back pain with left-sided sciatica 08/10/2020  . Left hip pain 08/10/2020  . Status post total left knee replacement 06/20/2020  . Primary osteoarthritis of left knee 06/19/2020  . Left knee pain 02/26/2020  . Bilateral arm numbness and tingling while sleeping 02/26/2020  . Muscle cramps 05/12/2019  . Cramps, muscle, general 04/23/2019  . Tinnitus 04/16/2019  . Chest pain of uncertain etiology 50/05/3817  . Bilateral leg edema 02/10/2019  . Port-A-Cath in place 01/30/2019  . Anemia, iron deficiency 11/21/2018  . Malignant neoplasm of upper-outer quadrant of right breast in female, estrogen receptor positive ([place]) 11/17/2018  . Constipation 07/01/2018  . Weight loss 07/22/2017  . Memory loss 12/21/2016  . Tremor 12/21/2016  . Right sided temporal headache 12/21/2016  . Trochanteric bursitis, right hip 10/26/2016  . Pruritus 10/26/2016  . Pain of right side of body 08/31/2016  . Headache 04/24/2016  . LLQ pain 04/24/2016  . Routine general medical examination at a health care facility 10/27/2015  . Facial pain 10/06/2015  . Trigeminal neuralgia of right side of face 11/30/2010  . Morbid obesity ([place]) 11/09/2010  . Fibromyalgia 11/09/2010  . Allergic rhinitis 08/26/2008  . VULVA INTRAEPITHELIAL NEOPLASIA, VIN I  06/18/2008    Current Outpatient Medications on File Prior to Visit  Medication Sig Dispense Refill  . amoxicillin (AMOXIL) 500 MG capsule Take 4 pills one hour before procedure (Patient not taking: No sig reported) 12 capsule 2  . diclofenac Sodium (VOLTAREN) 1 % GEL Apply 2 g topically 4 (four) times daily. 150 g 0  . diphenhydrAMINE (BENADRYL) 25 MG tablet Take 25 mg by mouth every 6 (six) hours as needed for itching, allergies or sleep. (Patient not taking: No sig reported)    . docusate sodium (COLACE) 100 MG capsule Take 1 capsule (100 mg total) by mouth daily as needed. (Patient not taking: No sig reported) 30 capsule 2  . exemestane (AROMASIN) 25 MG tablet TAKE 1 TABLET (25 MG TOTAL) BY MOUTH DAILY AFTER BREAKFAST. 90 tablet 1  . gabapentin (NEURONTIN) 600 MG tablet TAKE 2 TABLETS BY MOUTH 3 TIMES A DAY. Please call [phone] to schedule an appt. 540 tablet 4  . hydrOXYzine (ATARAX/VISTARIL) 25 MG tablet Take 0.5 tablets (12.5 mg total) by mouth every 8 (eight) hours as needed for itching. (Patient not taking: No sig reported) 30 tablet 0  . omeprazole (PRILOSEC) 20 MG capsule Take 1 capsule (20 mg total) by mouth 2 (two) times daily for 14 days. 28 capsule 0   No current facility-administered medications on file prior to visit.    Allergies  Allergen Reactions  . Keflex [Cephalexin] Anaphylaxis and Other (See Comments)    Caused violent dizziness and syncope  . Clindamycin Hcl Itching    Objective:  General: Alert and oriented x3 in no acute distress  Dermatology: No open lesions bilateral lower extremities, no webspace macerations, no ecchymosis bilateral, all nails x 10 are well manicured.  Vascular: Dorsalis Pedis and Posterior Tibial pedal pulses 2/4, Capillary Fill Time 3 seconds, (+) pedal hair growth bilateral, no edema bilateral lower extremities, Temperature gradient within normal limits.  Neurology: Gross sensation intact via light touch bilateral, Protective  sensation intact  with Semmes Weinstein Monofilament to all pedal sites, Position sense intact, vibratory intact bilateral, Deep tendon reflexes within normal limits bilateral, No babinski sign present bilateral. (+/-) Tinels sign right/left foot.   Musculoskeletal: Mild tenderness with palpation left greater than right bunion deformity, there is limitation with range of motion at first MPJ on the left, deformity reducible, tracking not trackbound on the right however limited on the left, 25 degrees of dorsiflexion on the left 10 degrees of plantarflexion on the left at the first MPJs bilateral, there is 45 degrees of dorsiflexion on the right and 10 degrees of plantarflexion on the right, there is significant pronation noted upon weightbearing, HAV deformity supported on ground with no second toe crossover deformity noted.   Xrays  Right/Left Foot    Impression: Intermetatarsal angle above normal limits consistent with bunion with significant arthritis at the first MPJ left greater than right, there is mild contracture second toes bilateral consistent with hammertoe and mild elongation of second metatarsal.  Pes planus foot type with midtarsal breech and dorsal bone spurs as well as posterior and inferior heel spurs noted bilateral.      Assessment and Plan: Problem List Items Addressed This Visit      Other   Muscle cramps    Other Visit Diagnoses    Bunion    -  Primary   Hammertoes of both feet       Relevant Orders   DG Foot Complete Right (Completed)   DG Foot Complete Left (Completed)   Arthritis of foot       Relevant Medications   diclofenac (VOLTAREN) 75 MG EC tablet   Chemotherapy-induced neuropathy (HCC)       Foot pain, bilateral           -Complete examination performed -Xrays reviewed -Discussed treatement options; discussed HAV deformity and hammertoe;conservative and  Surgical management; risks, benefits, alternatives discussed. All patient's questions  answered. -Dispensed bunion shields for patient to use as directed -Recommend good supportive shoes daily for foot type -Prescribed diclofenac for patient to take as directed -Advised patient to try topical Voltaren to the areas as needed for pain and swelling -Patient to return to office in 6 weeks or sooner if condition worsens.  Landis Martins, DPM

## 2020-12-28 ENCOUNTER — Encounter: Payer: Self-pay | Admitting: Internal Medicine

## 2021-01-02 ENCOUNTER — Telehealth: Payer: Self-pay | Admitting: *Deleted

## 2021-01-02 ENCOUNTER — Encounter: Payer: Self-pay | Admitting: *Deleted

## 2021-01-02 NOTE — Telephone Encounter (Signed)
I faxed patient assessment form to Alc[facility] Clever on 01/02/21 [phone]

## 2021-01-13 ENCOUNTER — Other Ambulatory Visit: Payer: Self-pay | Admitting: Urology

## 2021-01-13 DIAGNOSIS — N201 Calculus of ureter: Secondary | ICD-10-CM

## 2021-01-16 ENCOUNTER — Other Ambulatory Visit (HOSPITAL_COMMUNITY)
Admission: RE | Admit: 2021-01-16 | Discharge: 2021-01-16 | Disposition: A | Discharge status: Home / Self Care | Payer: Federal, State, Local not specified - PPO | Source: Ambulatory Visit | Attending: Urology | Admitting: Urology

## 2021-01-16 DIAGNOSIS — N132 Hydronephrosis with renal and ureteral calculous obstruction: Secondary | ICD-10-CM | POA: Diagnosis present

## 2021-01-16 DIAGNOSIS — M1990 Unspecified osteoarthritis, unspecified site: Secondary | ICD-10-CM | POA: Diagnosis not present

## 2021-01-16 DIAGNOSIS — Z79899 Other long term (current) drug therapy: Secondary | ICD-10-CM | POA: Diagnosis not present

## 2021-01-16 DIAGNOSIS — Z881 Allergy status to other antibiotic agents status: Secondary | ICD-10-CM | POA: Diagnosis not present

## 2021-01-16 DIAGNOSIS — Z20822 Contact with and (suspected) exposure to covid-19: Secondary | ICD-10-CM | POA: Insufficient documentation

## 2021-01-16 DIAGNOSIS — Z87442 Personal history of urinary calculi: Secondary | ICD-10-CM | POA: Diagnosis not present

## 2021-01-16 DIAGNOSIS — Z01812 Encounter for preprocedural laboratory examination: Secondary | ICD-10-CM | POA: Insufficient documentation

## 2021-01-16 DIAGNOSIS — E669 Obesity, unspecified: Secondary | ICD-10-CM | POA: Diagnosis not present

## 2021-01-16 DIAGNOSIS — M797 Fibromyalgia: Secondary | ICD-10-CM | POA: Diagnosis not present

## 2021-01-16 DIAGNOSIS — D649 Anemia, unspecified: Secondary | ICD-10-CM | POA: Diagnosis not present

## 2021-01-16 DIAGNOSIS — Z6831 Body mass index (BMI) 31.0-31.9, adult: Secondary | ICD-10-CM | POA: Diagnosis not present

## 2021-01-17 LAB — SARS CORONAVIRUS 2 (TAT 6-24 HRS): SARS Coronavirus 2: NEGATIVE

## 2021-01-17 NOTE — H&P (Signed)
Microscopic hematuria  HPI:  12/14/2020  70 year old female presents with primary complaint of anemia. She states she does not know where her anemia is coming from. Her urinalysis is negative. She states she has had microscopic hematuria in the past. She has never had gross hematuria. She does have occasional left-sided back pain that she attributes to constipation. On review of a CT from 09/21/2020 it looks like she may have an approximately 8 mm proximal left ureteral calculus that was not mention in the radiology read. She also had tiny nonobstructing calculi in the kidneys. She has a history nephrolithiasis.   01/11/2021: 70 year old female with the above noted past medical history. She presents today for CT scan as well as KUB. In January she was found to have a 8-9 mm proximal ureteral stone on the left side causing moderate hydronephrosis. This was not mentioned in her radiology read. She denies fevers, chills, flank pain and discomfort. She does report occasional flank pain that comes and goes but is not severe. She denies gross hematuria. She denies recurring UTIs.     ALLERGIES: Cleocin CAPS Clindamycin    MEDICATIONS: Diclofenac Sodium 1 % gel 1 PO Daily  Gabapentin 600 mg tablet 1 tablet PO Daily     GU PSH: Hysterectomy Unilat SO - 2013 Ureteroscopic stone removal - 2013       [employer] Notes: Cystoscopy With Ureteroscopy With Removal Of Calculus, Oral Surgery Tooth Extraction, Tonsillectomy, Complete Colonoscopy, Total Abdominal Hysterectomy, Colon Surgery   gamma ray     NON-GU PSH: Dental Surgery Procedure - 2013 Diagnostic Colonoscopy - 2013 Remove Tonsils - 2013     GU PMH: Flank Pain - 12/14/2020 Renal and ureteral calculus - 12/14/2020 Incomplete bladder emptying - 2020 Nocturia - 2020 Renal calculus (Stable), Serum studies: No abnormality and specifically normal PTH and calcium. 24-hour urine: She initially had a 24-hour urine that was sent for cystine only and this was  noted to be elevated. Her second 24-hour urine showed her only abnormality was a low total volume of 720 cc per 24 hours. We therefore discussed the need to increase her water intake. She likes to drink a lot of ice tea and I recommended more water than ice tea. I will then have her return for a KUB in 6 months. - 2017, Bilateral, We discussed the fact that she has a minute stone in the right kidney which is of no clinical significance. The stone in her left kidney is not causing the pain in her hip as this stone has been present for over a year. It has however increased in size and because of that I have recommended further evaluation due to the metabolic stone activity that she has demonstrated. I will perform this and then have her return to go over the results and determine how to best prevent further stone formation. She does not need surgical intervention for this asymptomatic stone., - 2017 Renal cyst, Left, She has a simple cyst of the left kidney of clinical significance. - 2017, Renal cyst, acquired, left, - 2014 Pelvic/perineal pain, Female pelvic pain - 2014 Ureteral calculus, Mid Ureteral Stone On The Left - 2014, Ureteral Stone, - 2014      PMH Notes: Nephrolithiasis: She was found to have a left upper ureteral stone by CT scan in 3/11 and was scheduled for lithotripsy of the stone however at the time of her lithotripsy the stone could not be visualized on plain film and therefore could not be treated. She had  multiple phleboliths in the pelvis and I felt one of those calcifications very likely could have represented her stone in the distal ureter so I maintained her on medical expulsive therapy.   CT scan done on 12/27/09 revealed that her left ureteral calculus had passed spontaneously. No renal calculi were identified. There was a calcification noted near the midline in the area of the bladder which could be seen on several previous KUBs and was read by the radiologist as possibly being a  bladder stone however I felt the calcification was outside of the bladder. I confirmed the fact it was outside of the bladder by performing cystoscopy on 12/02/09 without any bladder calculi being identified.   CT scan done on 06/14/12 revealed a 6 mm mid left ureteral stone. A left renal cyst was identified that there were no renal calculi noted on the right or left side.   CT scan 6/17 - sub-millimeter stone in the midpole of the right kidney as well as a 7 mm left lower pole renal. This same stone was present on her CT scan in 3/16. It appeared to have increased in size slightly.    Left renal cyst. She had an incidentally noted simple cyst of the left kidney seen on CT scan in 10/13.   NON-GU PMH: Fibromyalgia, Fibromyalgia - 2014 Neuralgia and neuritis, unspecified, Neuralgia - 2014 Personal history of other diseases of the circulatory system, History of hypertension - 2014 Personal history of other diseases of the digestive system, History of diverticulitis of colon - 2014 Personal history of other infectious and parasitic diseases, History of herpes zoster - 2014 Encounter for general adult medical examination without abnormal findings, Encounter for preventive health examination    FAMILY HISTORY: Breast Cancer - [place] Status Number - Runs In Family Father Deceased At Age57 ___ - Runs In Family Heart Disease - Mother Hypertension - Mother Lupus Vulgaris - Runs In Family Mother Deceased At Age 35 from diabetic complicati - Runs In Family   SOCIAL HISTORY: Marital Status: Widowed Preferred Language: English; Ethnicity: Not Hispanic Or Latino; Race: Black or African American Current Smoking Status: Patient has never smoked.  Social Drinker.  Drinks 3 caffeinated drinks per day.     Notes: Caffeine Use, Occupation:, Alcohol Use, Marital History - Single, Never Smoked   REVIEW OF SYSTEMS:    GU Review Female:   Patient denies frequent urination, hard to postpone  urination, burning /pain with urination, get up at night to urinate, leakage of urine, stream starts and stops, trouble starting your stream, have to strain to urinate, and being pregnant.  Gastrointestinal (Upper):   Patient denies nausea, vomiting, and indigestion/ heartburn.  Gastrointestinal (Lower):   Patient denies diarrhea and constipation.  Constitutional:   Patient denies fever, night sweats, weight loss, and fatigue.  Skin:   Patient denies skin rash/ lesion and itching.  Eyes:   Patient denies blurred vision and double vision.  Hematologic/Lymphatic:   Patient denies swollen glands and easy bruising.  Musculoskeletal:   Patient denies back pain and joint pain.  Neurological:   Patient denies headaches and dizziness.  Psychologic:   Patient denies depression and anxiety.   Notes: Occasional intermittent left flank and lower abdominal discomfort    VITAL SIGNS:      01/11/2021 04:02 PM  Weight 184 lb / 83.46 kg  Height 66 in / 167.64 cm  BP 144/64 mmHg  Pulse 65 /min  Temperature 96.4 F / 35.7 C  BMI 29.7  kg/m   MULTI-SYSTEM PHYSICAL EXAMINATION:    Constitutional: Well-nourished. No physical deformities. Normally developed. Good grooming.  Respiratory: No labored breathing, no use of accessory muscles.   Lymphatic: No enlargement of neck, axillae, groin.  Skin: No paleness, no jaundice, no cyanosis. No lesion, no ulcer, no rash.  Neurologic / Psychiatric: Oriented to time, oriented to place, oriented to person. No depression, no anxiety, no agitation.     Complexity of Data:  Source Of History:  Patient, Medical Record Summary  Records Review:   Previous Doctor Records, Previous Hospital Records, Previous Patient Records  Urine Test Review:   Urinalysis, Urine Culture  X-Ray Review: KUB: Reviewed Films. Discussed With Patient.  C.T. Abdomen/Pelvis: Reviewed Films. Reviewed Report. Discussed With Patient.     01/11/21  Urinalysis  Urine Appearance Clear   Urine  Color Yellow   Urine Glucose Neg mg/dL  Urine Bilirubin Neg mg/dL  Urine Ketones Neg mg/dL  Urine Specific Gravity 1.025   Urine Blood Neg ery/uL  Urine pH 5.5   Urine Protein Neg mg/dL  Urine Urobilinogen 0.2 mg/dL  Urine Nitrites Neg   Urine Leukocyte Esterase Neg leu/uL   PROCEDURES:         C.T. Urogram - [id number]      Patient confirmed No Neulasta OnPro Device.          KUB - [id number]  A single view of the abdomen is obtained. Bilateral renal shadows are well visualized. There are multiple phleboliths noted within the pelvic inlet. There is a prominent overlying bowel gas pattern. Along the expected course of the left ureter, Behind the pelvis there is a 8 mm opacity noted this is very similar to the opacity noted on CT scan.      Patient confirmed No Neulasta OnPro Device.           Urinalysis - 81003 Dipstick Dipstick Cont'd  Color: Yellow Bilirubin: Neg  Appearance: Clear Ketones: Neg  Specific Gravity: 1.025 Blood: Neg  pH: 5.5 Protein: Neg  Glucose: Neg Urobilinogen: 0.2    Nitrites: Neg    Leukocyte Esterase: Neg    Notes:      ASSESSMENT:      ICD-10 Details  1 GU:   Renal and ureteral calculus - N20.2 Left, Acute, Systemic Symptoms   PLAN:            Medications New Meds: Ondansetron Hcl 4 mg tablet 1 tablet PO Q 6 H PRN for nausea  #20  0 Refill(s)  Oxycodone-Acetaminophen 5 mg-325 mg tablet 1 tablet PO Q 6 H PRN as needed for severe pain  #20  0 Refill(s)            Schedule Return Visit/Planned Activity: Next Available Appointment - Schedule Surgery          Document Letter(s):  Created for Patient: Clinical Summary         Notes:   KUB does easily visualized left-sided ureteral stone behind the pelvis. We discussed stone management strategies including medical expulsive therapy, ureteroscopy, and ESWL. She would like to undergo ESWL. We discussed the risks and the benefits of this procedure in detail today. She understands that her stone may  require another surgical procedure if ESWL as not successful. Pain and out a medicine were sent to her pharmacy today. Her case was discussed with her urologist today as well. Surgical post thing she for ESWL was placed. Strict return precautions were given in the interval including fever, chills, worsening symptomatology.  After a thorough review of the management options for the patient's condition the patient  elected to proceed with surgical therapy as noted above. We have discussed the potential benefits and risks of the procedure, side effects of the proposed treatment, the likelihood of the patient achieving the goals of the procedure, and any potential problems that might occur during the procedure or recuperation. Informed consent has been obtained.

## 2021-01-17 NOTE — Progress Notes (Signed)
Talked with patient. Instructions given. arrival time 0800. Cl. Liquids until 0600 driver is secured. covid test on 01/16/21 states does not take voltaren po but does do the gel. Has not used since Sunday 01/15/21

## 2021-01-19 ENCOUNTER — Ambulatory Visit (HOSPITAL_BASED_OUTPATIENT_CLINIC_OR_DEPARTMENT_OTHER)
Admission: RE | Admit: 2021-01-19 | Discharge: 2021-01-19 | Disposition: A | Discharge status: Home / Self Care | Payer: Federal, State, Local not specified - PPO | Attending: Urology | Admitting: Urology

## 2021-01-19 ENCOUNTER — Ambulatory Visit (HOSPITAL_COMMUNITY): Payer: Federal, State, Local not specified - PPO

## 2021-01-19 ENCOUNTER — Encounter (HOSPITAL_BASED_OUTPATIENT_CLINIC_OR_DEPARTMENT_OTHER)
Admission: RE | Disposition: A | Discharge status: Home / Self Care | Payer: Self-pay | Source: Home / Self Care | Attending: Urology

## 2021-01-19 ENCOUNTER — Other Ambulatory Visit: Payer: Self-pay

## 2021-01-19 ENCOUNTER — Encounter (HOSPITAL_BASED_OUTPATIENT_CLINIC_OR_DEPARTMENT_OTHER): Payer: Self-pay | Admitting: Urology

## 2021-01-19 DIAGNOSIS — M797 Fibromyalgia: Secondary | ICD-10-CM | POA: Insufficient documentation

## 2021-01-19 DIAGNOSIS — Z87442 Personal history of urinary calculi: Secondary | ICD-10-CM | POA: Insufficient documentation

## 2021-01-19 DIAGNOSIS — N132 Hydronephrosis with renal and ureteral calculous obstruction: Secondary | ICD-10-CM | POA: Diagnosis not present

## 2021-01-19 DIAGNOSIS — E669 Obesity, unspecified: Secondary | ICD-10-CM | POA: Insufficient documentation

## 2021-01-19 DIAGNOSIS — M1990 Unspecified osteoarthritis, unspecified site: Secondary | ICD-10-CM | POA: Insufficient documentation

## 2021-01-19 DIAGNOSIS — Z6831 Body mass index (BMI) 31.0-31.9, adult: Secondary | ICD-10-CM | POA: Insufficient documentation

## 2021-01-19 DIAGNOSIS — D649 Anemia, unspecified: Secondary | ICD-10-CM | POA: Insufficient documentation

## 2021-01-19 DIAGNOSIS — N201 Calculus of ureter: Secondary | ICD-10-CM

## 2021-01-19 DIAGNOSIS — Z20822 Contact with and (suspected) exposure to covid-19: Secondary | ICD-10-CM | POA: Insufficient documentation

## 2021-01-19 DIAGNOSIS — Z79899 Other long term (current) drug therapy: Secondary | ICD-10-CM | POA: Insufficient documentation

## 2021-01-19 DIAGNOSIS — Z881 Allergy status to other antibiotic agents status: Secondary | ICD-10-CM | POA: Insufficient documentation

## 2021-01-19 HISTORY — PX: EXTRACORPOREAL SHOCK WAVE LITHOTRIPSY: SHX1557

## 2021-01-19 SURGERY — LITHOTRIPSY, ESWL
Anesthesia: LOCAL | Laterality: Left

## 2021-01-19 MED ORDER — DIPHENHYDRAMINE HCL 25 MG PO CAPS
ORAL_CAPSULE | ORAL | Status: AC
  Filled 2021-01-19: qty 1

## 2021-01-19 MED ORDER — DIAZEPAM 5 MG PO TABS
ORAL_TABLET | ORAL | Status: AC
  Filled 2021-01-19: qty 2

## 2021-01-19 MED ORDER — DIPHENHYDRAMINE HCL 25 MG PO CAPS
25.0000 mg | ORAL_CAPSULE | ORAL | Status: AC
  Administered 2021-01-19: 25 mg via ORAL

## 2021-01-19 MED ORDER — CIPROFLOXACIN HCL 500 MG PO TABS
ORAL_TABLET | ORAL | Status: AC
  Filled 2021-01-19: qty 1

## 2021-01-19 MED ORDER — CIPROFLOXACIN HCL 500 MG PO TABS
500.0000 mg | ORAL_TABLET | ORAL | Status: AC
  Administered 2021-01-19: 500 mg via ORAL

## 2021-01-19 MED ORDER — DIAZEPAM 5 MG PO TABS
10.0000 mg | ORAL_TABLET | ORAL | Status: AC
  Administered 2021-01-19: 10 mg via ORAL

## 2021-01-19 MED ORDER — SODIUM CHLORIDE 0.9 % IV SOLN: INTRAVENOUS | Status: DC

## 2021-01-19 NOTE — Interval H&P Note (Signed)
History and Physical Interval Note:  01/19/2021 9:59 AM  Summer [name]  has presented today for surgery, with the diagnosis of LEFT MID URETERAL STONE.  The various methods of treatment have been discussed with the patient and family. After consideration of risks, benefits and other options for treatment, the patient has consented to  Procedure(s): LEFT EXTRACORPOREAL SHOCK WAVE LITHOTRIPSY (ESWL) (Left) as a surgical intervention.  The patient's history has been reviewed, patient examined, no change in status, stable for surgery.  I have reviewed the patient's chart and labs.  Questions were answered to the patient's satisfaction.     Arianny Pun A Peter Daquila

## 2021-01-19 NOTE — Interval H&P Note (Signed)
History and Physical Interval Note:  01/19/2021 8:30 AM  Summer [name]  has presented today for surgery, with the diagnosis of LEFT MID URETERAL STONE.  The various methods of treatment have been discussed with the patient and family. After consideration of risks, benefits and other options for treatment, the patient has consented to  Procedure(s): LEFT EXTRACORPOREAL SHOCK WAVE LITHOTRIPSY (ESWL) (Left) as a surgical intervention.  The patient's history has been reviewed, patient examined, no change in status, stable for surgery.  I have reviewed the patient's chart and labs.  Questions were answered to the patient's satisfaction.     Lalani Winkles A Avneet Ashmore

## 2021-01-19 NOTE — Interval H&P Note (Signed)
History and Physical Interval Note:  01/19/2021 8:30 AM  Summer [name]  has presented today for surgery, with the diagnosis of LEFT MID URETERAL STONE.  The various methods of treatment have been discussed with the patient and family. After consideration of risks, benefits and other options for treatment, the patient has consented to  Procedure(s): LEFT EXTRACORPOREAL SHOCK WAVE LITHOTRIPSY (ESWL) (Left) as a surgical intervention.  The patient's history has been reviewed, patient examined, no change in status, stable for surgery.  I have reviewed the patient's chart and labs.  Questions were answered to the patient's satisfaction.     Antowan Samford A Salomon Ganser   

## 2021-01-19 NOTE — Interval H&P Note (Signed)
History and Physical Interval Note:  01/19/2021 8:30 AM  Summer [name]  has presented today for surgery, with the diagnosis of LEFT MID URETERAL STONE.  The various methods of treatment have been discussed with the patient and family. After consideration of risks, benefits and other options for treatment, the patient has consented to  Procedure(s): LEFT EXTRACORPOREAL SHOCK WAVE LITHOTRIPSY (ESWL) (Left) as a surgical intervention.  The patient's history has been reviewed, patient examined, no change in status, stable for surgery.  I have reviewed the patient's chart and labs.  Questions were answered to the patient's satisfaction.     Gisel Vipond A Cayle Cordoba   

## 2021-01-19 NOTE — Discharge Instructions (Signed)
Lithotripsy, Care After This sheet gives you information about how to care for yourself after your procedure. Your health care provider may also give you more specific instructions. If you have problems or questions, contact your health care provider. What can I expect after the procedure? After the procedure, it is common to have:  Some blood in your urine. This should only last for a few days.  Soreness in your back, sides, or upper abdomen for a few days.  Blotches or bruises on the area where the shock wave entered the skin.  Pain, discomfort, or nausea when pieces (fragments) of the kidney stone move through the tube that carries urine from the kidney to the bladder (ureter). Stone fragments may pass soon after the procedure, but they may continue to pass for up to 4-8 weeks. ? If you have severe pain or nausea, contact your health care provider. This may be caused by a large stone that was not broken up, and this may mean that you need more treatment.  Some pain or discomfort during urination.  Some pain or discomfort in the lower abdomen or (in men) at the base of the penis. Follow these instructions at home: Medicines  Take over-the-counter and prescription medicines only as told by your health care provider.  If you were prescribed an antibiotic medicine, take it as told by your health care provider. Do not stop taking the antibiotic even if you start to feel better.  Ask your health care provider if the medicine prescribed to you requires you to avoid driving or using machinery. Eating and drinking  Drink enough fluid to keep your urine pale yellow. This helps any remaining pieces of the stone to pass. It can also help prevent new stones from forming.  Eat plenty of fresh fruits and vegetables.  Follow instructions from your health care provider about eating or drinking restrictions. You may be instructed to: ? Reduce how much salt (sodium) you eat or drink. Check  ingredients and nutrition facts on packaged foods and beverages to see how much sodium they contain. ? Reduce how much meat you eat.  Eat the recommended amount of calcium for your age and gender. Ask your health care provider how much calcium you should have.      General instructions  Get plenty of rest.  Return to your normal activities as told by your health care provider. Ask your health care provider what activities are safe for you. Most people can resume normal activities 1-2 days after the procedure.  If you were given a sedative during the procedure, it can affect you for several hours. Do not drive or operate machinery until your health care provider says that it is safe.  Your health care provider may direct you to lie in a certain position (postural drainage) and tap firmly (percuss) over your kidney area to help stone fragments pass. Follow instructions as told by your health care provider.  If directed, strain all urine through the strainer that was provided by your health care provider. ? Keep all fragments for your health care provider to see. Any stones that are found may be sent to a medical lab for examination. The stone may be as small as a grain of salt.  Keep all follow-up visits as told by your health care provider. This is important. Contact a health care provider if:  You have a fever or chills.  You have nausea that is severe or does not go away.  You have  any of these urinary symptoms: ? Blood in your urine for longer than your health care provider told you to expect. ? Urine that smells bad or unusual. ? Feeling a strong urge to urinate after emptying your bladder. ? Pain or burning with urination that does not go away. ? Urinating more often than usual and this does not go away.  You have a stent and it comes out. Get help right away if:  You have severe pain in your back, sides, or upper abdomen.  You have any of these urinary symptoms: ? Severe  pain while urinating. ? More blood in your urine or having blood in your urine when you did not before. ? Passing blood clots in your urine. ? Passing only a small amount of urine or being unable to pass any urine at all.  You have severe nausea that leads to persistent vomiting.  You faint. Summary  After this procedure, it is common to have some pain, discomfort, or nausea when pieces (fragments) of the kidney stone move through the tube that carries urine from the kidney to the bladder (ureter). If this pain or nausea is severe, however, you should contact your health care provider.  Return to your normal activities as told by your health care provider. Ask your health care provider what activities are safe for you.  Drink enough fluid to keep your urine pale yellow. This helps any remaining pieces of the stone to pass, and it can help prevent new stones from forming.  If directed, strain your urine and keep all fragments for your health care provider to see. Fragments or stones may be as small as a grain of salt.  Get help right away if you have severe pain in your back, sides, or upper abdomen, or if you have severe pain while urinating. This information is not intended to replace advice given to you by your health care provider. Make sure you discuss any questions you have with your health care provider. Document Revised: 06/10/2019 Document Reviewed: 06/10/2019 Elsevier Patient Education  2021 [place]. I have reviewed discharge instructions in detail with the patient. They will follow-up with me or their physician as scheduled. My nurse will also be calling the patients as per protocol.

## 2021-01-20 ENCOUNTER — Encounter (HOSPITAL_BASED_OUTPATIENT_CLINIC_OR_DEPARTMENT_OTHER): Payer: Self-pay | Admitting: Urology

## 2021-01-23 ENCOUNTER — Other Ambulatory Visit: Payer: Federal, State, Local not specified - PPO

## 2021-01-23 DIAGNOSIS — A048 Other specified bacterial intestinal infections: Secondary | ICD-10-CM

## 2021-01-24 LAB — HELICOBACTER PYLORI  SPECIAL ANTIGEN
MICRO NUMBER:: 11894430
SPECIMEN QUALITY: ADEQUATE

## 2021-01-26 ENCOUNTER — Encounter: Payer: Self-pay | Admitting: Sports Medicine

## 2021-01-26 ENCOUNTER — Other Ambulatory Visit: Payer: Self-pay

## 2021-01-26 ENCOUNTER — Ambulatory Visit (INDEPENDENT_AMBULATORY_CARE_PROVIDER_SITE_OTHER): Payer: Federal, State, Local not specified - PPO | Admitting: Sports Medicine

## 2021-01-26 DIAGNOSIS — M21619 Bunion of unspecified foot: Secondary | ICD-10-CM

## 2021-01-26 DIAGNOSIS — M79672 Pain in left foot: Secondary | ICD-10-CM

## 2021-01-26 DIAGNOSIS — M79671 Pain in right foot: Secondary | ICD-10-CM

## 2021-01-26 DIAGNOSIS — M19079 Primary osteoarthritis, unspecified ankle and foot: Secondary | ICD-10-CM

## 2021-01-26 DIAGNOSIS — M2042 Other hammer toe(s) (acquired), left foot: Secondary | ICD-10-CM

## 2021-01-26 DIAGNOSIS — M2041 Other hammer toe(s) (acquired), right foot: Secondary | ICD-10-CM | POA: Diagnosis not present

## 2021-01-26 DIAGNOSIS — G5763 Lesion of plantar nerve, bilateral lower limbs: Secondary | ICD-10-CM

## 2021-01-26 DIAGNOSIS — M25571 Pain in right ankle and joints of right foot: Secondary | ICD-10-CM | POA: Diagnosis not present

## 2021-01-26 DIAGNOSIS — M25572 Pain in left ankle and joints of left foot: Secondary | ICD-10-CM

## 2021-01-26 IMAGING — DX DG CHEST 1V PORT
1 series · 1 of 1 positions shown · non-contrast
Comparison: November 28, 2018.

CLINICAL DATA: Cough.

EXAM:
PORTABLE CHEST 1 VIEW

[chest ap]
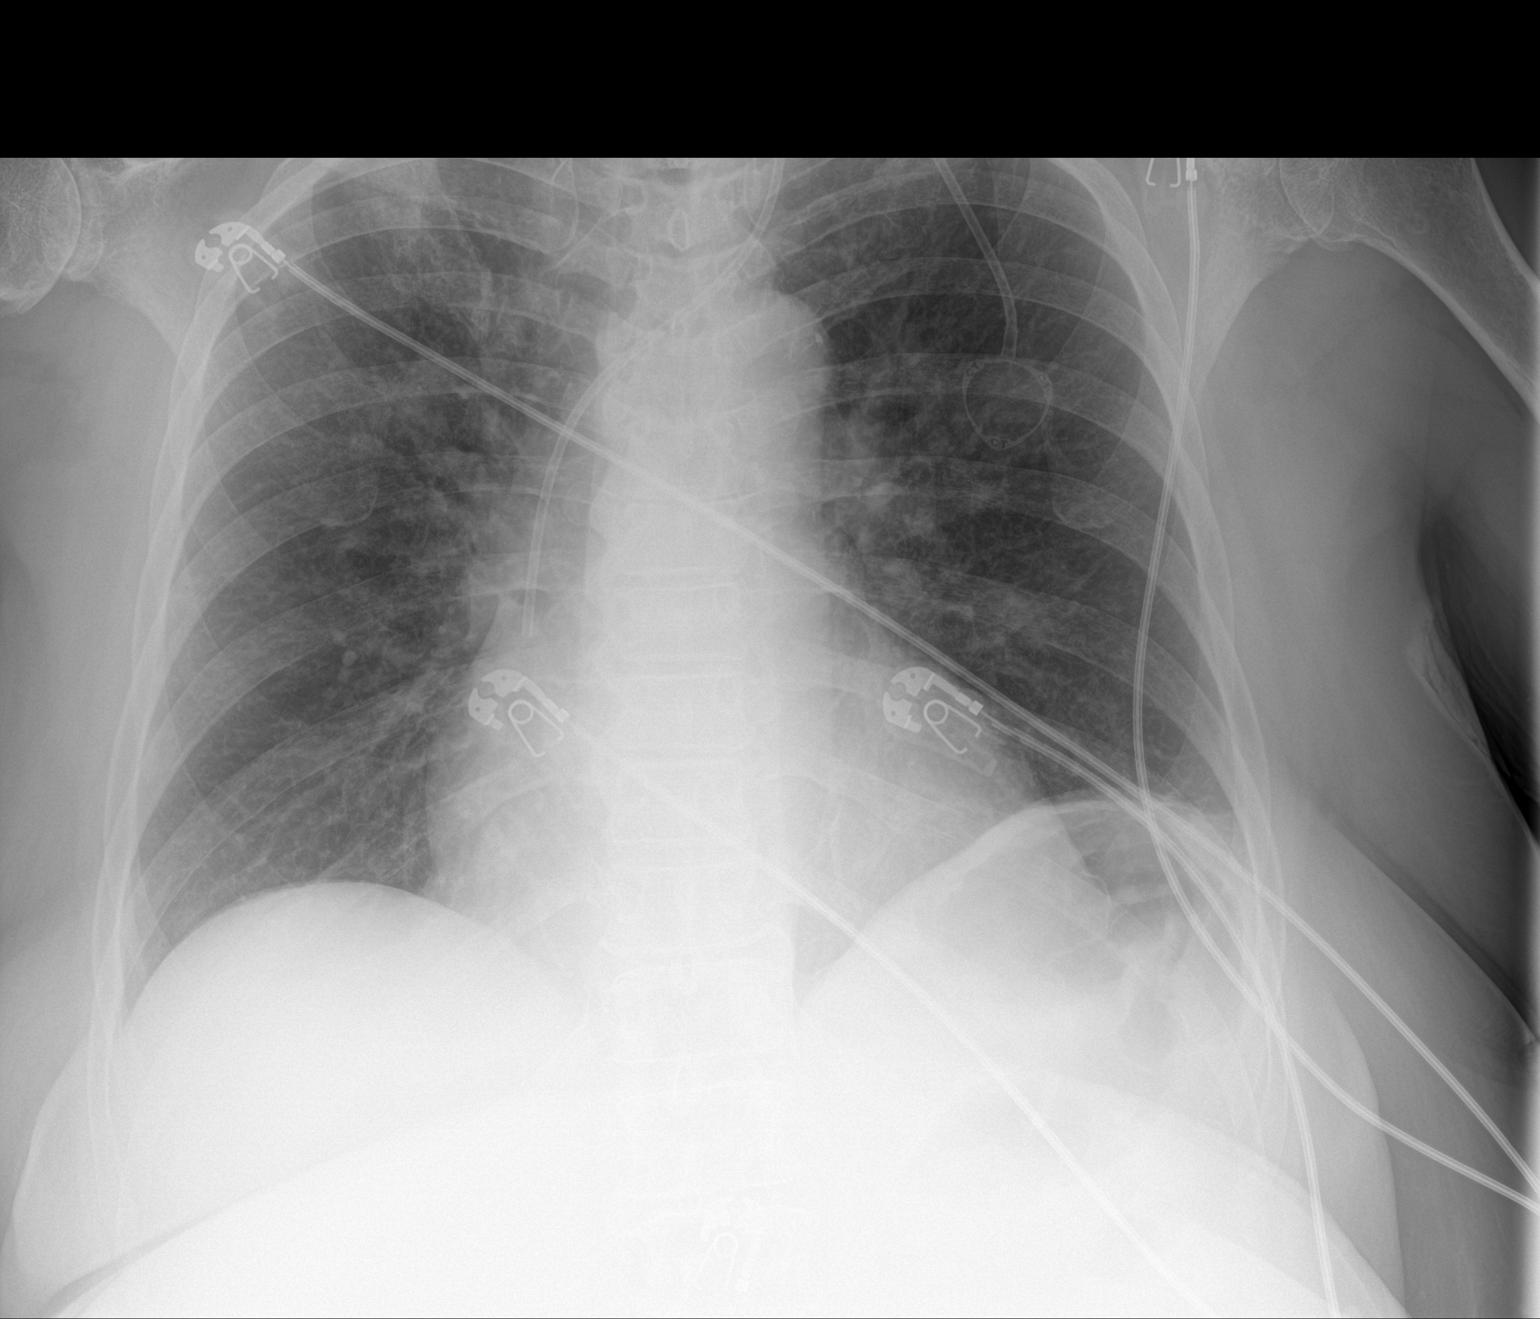

[1 of 1 positions shown; findings below may reference images not displayed]

FINDINGS: Stable cardiomediastinal silhouette. No pneumothorax or pleural
effusion is noted. Left internal jugular Port-A-Cath is unchanged in
position. Both lungs are clear. The visualized skeletal structures
are unremarkable.
IMPRESSION: No active disease.

## 2021-01-26 MED ORDER — CELECOXIB 200 MG PO CAPS: 200.0000 mg | ORAL_CAPSULE | Freq: Two times a day (BID) | ORAL | 1 refills | Status: DC

## 2021-01-26 NOTE — Progress Notes (Signed)
Subjective: Summer [name] is a 70 y.o. female patient who returns to office for follow-up evaluation of bilateral foot pain left greater than right.  Patient reports that she has pain still over the toe joints and underneath the second toe pain is worse over the first and second toes but is concerned about the slight curvature of her third through fifth toenails as well.  Patient reports no relief from bunion padding, oral anti-inflammatories or topical.  No other pedal complaints noted.  Patient Active Problem List   Diagnosis Date Noted  . Kidney stones 11/18/2020  . Chronic left-sided low back pain with left-sided sciatica 08/10/2020  . Left hip pain 08/10/2020  . Status post total left knee replacement 06/20/2020  . Primary osteoarthritis of left knee 06/19/2020  . Sensorineural hearing loss (SNHL) of both ears 06/06/2020  . Left knee pain 02/26/2020  . Bilateral arm numbness and tingling while sleeping 02/26/2020  . Muscle cramps 05/12/2019  . Cramps, muscle, general 04/23/2019  . Tinnitus 04/16/2019  . Chest pain of uncertain etiology 32/95/1884  . Bilateral leg edema 02/10/2019  . Port-A-Cath in place 01/30/2019  . Anemia, iron deficiency 11/21/2018  . Malignant neoplasm of upper-outer quadrant of right breast in female, estrogen receptor positive ([place]) 11/17/2018  . Constipation 07/01/2018  . Weight loss 07/22/2017  . Memory loss 12/21/2016  . Tremor 12/21/2016  . Right sided temporal headache 12/21/2016  . Trochanteric bursitis, right hip 10/26/2016  . Pruritus 10/26/2016  . Pain of right side of body 08/31/2016  . Headache 04/24/2016  . LLQ pain 04/24/2016  . Routine general medical examination at a health care facility 10/27/2015  . Facial pain 10/06/2015  . Trigeminal neuralgia of right side of face 11/30/2010  . Morbid obesity ([place]) 11/09/2010  . Fibromyalgia 11/09/2010  . Allergic rhinitis 08/26/2008  . 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 diclofenac Sodium (VOLTAREN) 1 % GEL Apply 2 g topically 4 (four) times daily. 150 g 0  . exemestane (AROMASIN) 25 MG tablet TAKE 1 TABLET (25 MG TOTAL) BY MOUTH DAILY AFTER BREAKFAST. 90 tablet 1  . gabapentin (NEURONTIN) 600 MG tablet TAKE 2 TABLETS BY MOUTH 3 TIMES A DAY. Please call [phone] to schedule an appt. 540 tablet 4  . ondansetron (ZOFRAN) 4 MG tablet Take 4 mg by mouth every 6 (six) hours as needed.    Marland Kitchen oxyCODONE-acetaminophen (PERCOCET/ROXICET) 5-325 MG tablet Take 1 tablet by mouth every 6 (six) hours as needed.     No current facility-administered medications on file prior to visit.    Allergies  Allergen Reactions  . Keflex [Cephalexin] Anaphylaxis and Other (See Comments)    Caused violent dizziness and syncope  . Clindamycin Hcl Itching    Objective:  General: Alert and oriented x3 in no acute distress  Dermatology: No open lesions bilateral lower extremities, no webspace macerations, no ecchymosis bilateral, all nails x 10 are well manicured.  Vascular: Dorsalis Pedis and Posterior Tibial pedal pulses 2/4, Capillary Fill Time 3 seconds, (+) pedal hair growth bilateral, no edema bilateral lower extremities, Temperature gradient within normal limits.  Neurology: Johney Maine sensation intact via light touch bilateral.  Musculoskeletal: Mild tenderness with palpation left greater than right bunion deformity, there is limitation with range of motion at first MPJ on the left, deformity reducible, tracking not trackbound on the right however limited on the left, 25 degrees of dorsiflexion  on the left 10 degrees of plantarflexion on the left at the first MPJs bilateral, there is 45 degrees of dorsiflexion on the right and 10 degrees of plantarflexion on the right, there is significant pronation noted upon weightbearing, HAV deformity supported on ground with no second toe crossover  deformity noted.  There is mild pain to palpation diffusely at the ball of the left foot worse at the left second MPJ.  There is mild lesser digital contractures noted 2 through 5 left greater than right.  Pes planus foot type.       Assessment and Plan: Problem List Items Addressed This Visit   None   Visit Diagnoses    Pain in joints of both feet    -  Primary   Relevant Orders   Rheumatoid factor   Hammer toes of both feet       Relevant Orders   Vitamin D, 25-hydroxy   Uric Acid   Sedimentation Rate   C-reactive protein   ANA, IFA Comprehensive Panel   Calcium   Bunion       Arthritis of foot       Relevant Medications   oxyCODONE-acetaminophen (PERCOCET/ROXICET) 5-325 MG tablet   celecoxib (CELEBREX) 200 MG capsule   Foot pain, bilateral       Morton metatarsalgia, bilateral           -Complete examination performed -Re-Discussed treatement options; discussed HAV deformity and hammertoe with diffuse pain to the forefoot/metatarsalgia;conservative and  Surgical management; risks, benefits, alternatives discussed. All patient's questions answered. -Advised patient since she has a previous history of low vitamin D we will repeat some blood work first and also do a additional arthritic panel to make sure her pain is not related to any other inflammatory cause -Prescribe Celebrex to take to see if this works better since the diclofenac did not help -Recommend good supportive shoes daily for foot type meanwhile -Advised patient of the extensive recovery that will be needed after surgery and advised patient to consider the risk versus the benefit of the procedure and made her well aware that surgery is not 100% guarantee -Patient to return to office after blood work or sooner if condition worsens.  Landis Martins, DPM

## 2021-02-10 NOTE — Progress Notes (Signed)
Summer [name]   [phone] Fax:([phone]) [phone]   Clinic Follow up Note   Patient Care Team: Summer Koch, MD as PCP - General (Internal Medicine) Summer Margarita, MD as PCP - Cardiology (Cardiology) Summer Kaufmann, RN as Oncology Nurse Navigator Summer Germany, RN as Oncology Nurse Navigator Summer [name], Marland Kitchen, MD (Inactive) as Consulting Physician (General Surgery) Summer Merle, MD as Consulting Physician (Hematology) Summer Pray, MD as Consulting Physician (Radiation Oncology) Summer Feeling, NP as Nurse Practitioner (Nurse Practitioner)  Date of Service:  02/15/2021  CHIEF COMPLAINT: F/u of right breast cancer  SUMMARY OF ONCOLOGIC HISTORY: Oncology History Overview Note  Cancer Staging Malignant neoplasm of upper-outer quadrant of right breast in female, estrogen receptor positive ([place]) Staging form: Breast, AJCC 8th Edition - Clinical stage from 11/10/2018: Stage IA (cT1b, cN0, cM0, G2, ER+, PR+, HER2+) - Signed by Summer Merle, MD on 11/18/2018     Malignant neoplasm of upper-outer quadrant of right breast in female, estrogen receptor positive ([place])  11/07/2018 Mammogram   Diagnostic 11/07/18 IMPRESSION: 1. 1 x 0.7 x 0.6 cm hypoechoic mass with distortion at the 9-9:30 position of the RIGHT breast 6 cm from the nipple with distortion in the UPPER-OUTER RIGHT breast, corresponding to the mammographic abnormality. One RIGHT axillary lymph node with borderline cortical thickness. Tissue sampling of the RIGHT breast mass and borderline RIGHT axillary lymph node recommended. ADDENDUM: Patient returned today for biopsy of a single borderline thickened lymph node in the RIGHT axilla. I could not reproduce a morphologically abnormal lymph node today, possibly resolved in the interval. Today, only normal appearing lymph nodes were identified in the RIGHT axilla. As such, the axillary ultrasound-guided biopsy was canceled.   11/10/2018 Cancer  Staging   Staging form: Breast, AJCC 8th Edition - Clinical stage from 11/10/2018: Stage IA (cT1b, cN0, cM0, G2, ER+, PR+, HER2+) - Signed by Summer Merle, MD on 11/18/2018   11/11/2018 Initial Biopsy   Diagnosis 11/11/18 Breast, right, needle core biopsy, upper outer - 9:30 o'clock position - INVASIVE DUCTAL CARCINOMA, GRADE II/III. - SEE MICROSCOPIC DESCRIPTION.   11/11/2018 Receptors her2   Results: IMMUNOHISTOCHEMICAL AND MORPHOMETRIC ANALYSIS PERFORMED MANUALLY The tumor cells are POSITIVE for Her2 (3+). Estrogen Receptor: 95%, POSITIVE, STRONG STAINING INTENSITY Progesterone Receptor: 5%, POSITIVE, STRONG STAINING INTENSITY Proliferation Marker Ki67: 20%   11/17/2018 Initial Diagnosis   Malignant neoplasm of upper-outer quadrant of right breast in female, estrogen receptor positive ([place])   11/28/2018 Surgery   RIGHT BREAST LUMPECTOMY WITH RADIOACTIVE SEED AND RIGHT AXILLARY SENTINEL LYMPH NODE BIOPSY by Dr. Excell [name]  11/28/18    11/28/2018 Pathology Results   Diagnosis 11/28/18  1. Breast, lumpectomy, Right w/seed - INVASIVE DUCTAL CARCINOMA, 1.6 CM, NOTTINGHAM GRADE 2 OF 3. - MARGINS OF RESECTION ARE NOT INVOLVED (CLOSEST MARGIN: LESS THAN 1 MM, ANTERIOR). - DUCTAL CARCINOMA IN SITU. - BIOPSY SITE CHANGES. - SEE ONCOLOGY TABLE. 2. Lymph node, sentinel, biopsy, right Axillary - ONE LYMPH NODE, NEGATIVE FOR CARCINOMA (0/1). 3. Lymph node, sentinel, biopsy, right Axillary - ONE LYMPH NODE, NEGATIVE FOR CARCINOMA (0/1).   11/28/2018 Cancer Staging   Staging form: Breast, AJCC 8th Edition - Pathologic stage from 11/28/2018: Stage IA (pT1c, pN0, cM0, G2, ER+, PR+, HER2+) - Signed by Summer Merle, MD on 12/11/2018   12/22/2018 Procedure   Baseline ECHO 12/22/18  IMPRESSIONS  1. The left ventricle has normal systolic function with an ejection fraction of 60-65%. The cavity size was normal. There is mild concentric left  ventricular hypertrophy. Left ventricular diastolic parameters were normal.  2.  The right ventricle has normal systolic function. The cavity was normal. There is no increase in right ventricular wall thickness.  3. Left atrial size was moderately dilated.  4. The aortic valve is tricuspid. Aortic valve regurgitation was not assessed by color flow Doppler.    01/02/2019 - 02/12/2020 Chemotherapy   Adjuvant chemo weekly Taxol with Herceptin starting 01/02/19. Will d/c after 02/13/19 and will switch to Kadcyla q3weeks starting 02/20/19 due to worsening neuropathy. Due to worsening Muscle cramps Kadcyla stopped 04/03/19 and switched to Herceptin q3weeks on 04/23/19. Completed on 02/12/20   04/09/2019 - 05/28/2019 Radiation Therapy   RT with Dr. Sondra Come 04/09/19-05/28/19   06/29/2019 -  Anti-estrogen oral therapy   Exemestane 76m daily starting 06/29/19   04/28/2020 Survivorship   SCP delivered by LCira Rue NP      CURRENT THERAPY:  -IV iron as needed -Exemestane 236mdaily starting 06/29/19  INTERVAL HISTORY:  CaKhayla Koppenhavers here for a follow up of breast cancer. She was last seen by me 6 months ago. She presents to the clinic alone. She notes she is doing well. She notes neuropathy in her feet is worse now with pain 7-8/10 since her chemo. She rarely has neuropathy in her hands. Her neurologia is stable. She is still on Gabapentin. She notes she is tolerating Exemestane well. She notes she has baseline arthralgia which is mostly stable, but still significant.   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 FMLA paperwork is the best way to have that time off.     REVIEW OF SYSTEMS:   Constitutional: Denies fevers, chills or abnormal weight loss Eyes: Denies blurriness of vision Ears, nose, mouth, throat, and face: Denies mucositis or sore throat Respiratory: Denies cough, dyspnea or wheezes Cardiovascular: Denies palpitation, chest discomfort or lower  extremity swelling Gastrointestinal:  Denies nausea, heartburn or change in bowel habits Skin: Denies abnormal skin rashes MSK: (+) Arthritis  Lymphatics: Denies new lymphadenopathy or easy bruising Neurological: (+) Neuropathy in her feet Behavioral/Psych: Mood is stable, no new changes  All other systems were reviewed with the patient and are negative.  MEDICAL HISTORY:  Past Medical History:  Diagnosis Date  . Allergic rhinitis 08/26/2008   Qualifier: Diagnosis of  By: ToSherren MochaD, JeJory Ee . Anemia, iron deficiency 11/21/2018  . Arthritis shoulders and back  . BACK PAIN, CHRONIC 06/13/2007   Qualifier: Diagnosis of  By: ToSherren MochaD, JeJory Ee . 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 Cataract    bilateral - no sx as of 09/23/2020)  . 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 Complication of anesthesia    Pt states having some "difficulty waking up"  . Constipation 07/01/2018  . Costochondritis 05/04/2014  . Diverticulitis of colon 06/13/2007   S/p segmental colectomy for perforated diverticulitis   . Diverticulosis   . Facial pain 10/06/2015  . Fibromyalgia   . Foot sprain, left, initial encounter 08/31/2016  . Glossitis 02/02/2016  . Headache 04/24/2016  . Helicobacter pylori gastritis 10/2020  . HEMATURIA UNSPECIFIED 06/25/2008   Qualifier: Diagnosis of  By: ToSherren MochaD, JeJory Ee . History of kidney stones   . History of shingles 2012--  no residual pain  . Hx of adenomatous colonic polyps 10/2020  . Itching 10/13/2018  .  Left eye pain 07/01/2018  . Left ureteral calculus   . LOC OSTEOARTHROS NOT SPEC PRIM/SEC OTH [facility] SITE 06/27/2009   Qualifier: Diagnosis of  By: Sherren Mocha MD, Jory Ee   . Malignant neoplasm of upper-outer quadrant of right breast in female, estrogen receptor positive ([place]) 11/17/2018  . MCI (mild cognitive impairment)   . Memory loss 12/21/2016  . Morbid obesity ([place]) 11/09/2010  . Pain of right side of body 08/31/2016  . Personal history of chemotherapy    . Personal history of radiation therapy   . Pruritus 10/26/2016  . Renal disorder    hx of  . Right sided temporal headache 12/21/2016  . Tremor 12/21/2016  . Trigeminal neuralgia RIGHT  . Trigeminal neuralgia of right side of face 11/30/2010  . Trochanteric bursitis, right hip 10/26/2016  . Urgency of urination   . Weight loss 07/22/2017  . Zoster without complications 2/45/8099    SURGICAL HISTORY: Past Surgical History:  Procedure Laterality Date  . BREAST BIOPSY Right 11/11/2018  . BREAST LUMPECTOMY Right 11/28/2018  . BREAST LUMPECTOMY WITH RADIOACTIVE SEED AND SENTINEL LYMPH NODE BIOPSY Right 11/28/2018   Procedure: RIGHT BREAST LUMPECTOMY WITH RADIOACTIVE SEED AND RIGHT AXILLARY SENTINEL LYMPH NODE BIOPSY;  Surgeon: Summer Seltzer, MD;  Location: [place];  Service: General;  Laterality: Right;  . CEREBRAL MICROVASCULAR DECOMPRESSION  07-05-2006   RIGHT TRIGEMINAL NERVE  . COLONOSCOPY  2013   CG-MAC-moviprep (exc)-normal -mild tics  . CYSTOSCOPY/RETROGRADE/URETEROSCOPY/STONE EXTRACTION WITH BASKET  07/04/2012   Procedure: CYSTOSCOPY/RETROGRADE/URETEROSCOPY/STONE EXTRACTION WITH BASKET;  Surgeon: Claybon Jabs, MD;  Location: [facility];  Service: Urology;  Laterality: Left;  . EXPLORATORY LAPAROTOMY/ RESECTION MID TO DISTAL SIGMOID AND PROXIMAL RECTUM/ END PROXIMAL SIGMOID COLOSTOMY  07-17-2006   PERFORATED DIVERTICULITIS WITH PERITONITIS  . EXTRACORPOREAL SHOCK WAVE LITHOTRIPSY Left 01/19/2021   Procedure: LEFT EXTRACORPOREAL SHOCK WAVE LITHOTRIPSY (ESWL);  Surgeon: Bjorn Loser, MD;  Location: [facility];  Service: Urology;  Laterality: Left;  . gamma knife  05/17/2016   for trigeminal neuralgia, [facility], Dr Salomon Fick  . gamma knife  2019  . IR CV LINE INJECTION  10/27/2019  . PORTACATH PLACEMENT Left 11/28/2018   Procedure: INSERTION PORT-A-CATH WITH ULTRASOUND;  Surgeon: Summer Seltzer, MD;  Location: [place];  Service: General;   Laterality: Left;  . portacath removal    . RESECTION COLOSTOMY/ CLOSURE COLOSTOMY WITH COLOPROCTOSTOMY  02-20-2007  . TONSILLECTOMY  AS CHILD  . TOTAL KNEE ARTHROPLASTY Left 06/20/2020   Procedure: LEFT TOTAL KNEE ARTHROPLASTY;  Surgeon: Leandrew Koyanagi, MD;  Location: [place];  Service: Orthopedics;  Laterality: Left;  . URETEROSCOPY  07/04/2012   Procedure: URETEROSCOPY;  Surgeon: Claybon Jabs, MD;  Location: [facility];  Service: Urology;  Laterality: Left;  Marland Kitchen VAGINAL HYSTERECTOMY  1998   Partial  . WISDOM TOOTH EXTRACTION      I have reviewed the social history and family history with the patient and they are unchanged from previous note.  ALLERGIES:  is allergic to keflex [cephalexin] and clindamycin hcl.  MEDICATIONS:  Current Outpatient Medications  Medication Sig Dispense Refill  . celecoxib (CELEBREX) 200 MG capsule Take 1 capsule (200 mg total) by mouth 2 (two) times daily. 30 capsule 1  . diclofenac Sodium (VOLTAREN) 1 % GEL Apply 2 g topically 4 (four) times daily. 150 g 0  . exemestane (AROMASIN) 25 MG tablet TAKE 1 TABLET (25 MG TOTAL) BY MOUTH DAILY AFTER BREAKFAST. 90 tablet 1  .  gabapentin (NEURONTIN) 600 MG tablet TAKE 2 TABLETS BY MOUTH 3 TIMES A DAY. Please call [phone] to schedule an appt. 540 tablet 4  . nystatin (MYCOSTATIN) 100000 UNIT/ML suspension Take 5 mLs (500,000 Units total) by mouth in the morning, at noon, and at bedtime. 120 mL 0  . ondansetron (ZOFRAN) 4 MG tablet Take 4 mg by mouth every 6 (six) hours as needed.    Marland Kitchen oxyCODONE-acetaminophen (PERCOCET/ROXICET) 5-325 MG tablet Take 1 tablet by mouth every 6 (six) hours as needed.     No current facility-administered medications for this visit.    PHYSICAL EXAMINATION: ECOG PERFORMANCE STATUS: 1 - Symptomatic but completely ambulatory  Vitals:   02/15/21 1247  BP: (!) 137/57  Pulse: 74  Resp: 18  Temp: 98.1 F (36.7 C)  SpO2: 100%   Filed Weights   02/15/21 1247  Weight:  190 lb 3.2 oz (86.3 kg)    GENERAL:alert, no distress and comfortable SKIN: skin color, texture, turgor are normal, no rashes or significant lesions (+) Skin boil of right axilla  EYES: normal, Conjunctiva are pink and non-injected, sclera clear  NECK: supple, thyroid normal size, non-tender, without nodularity LYMPH:  no palpable lymphadenopathy in the cervical, axillary  LUNGS: clear to auscultation and percussion with normal breathing effort HEART: regular rate & rhythm and no murmurs and no lower extremity edema ABDOMEN:abdomen soft, non-tender and normal bowel sounds Musculoskeletal:no cyanosis of digits and no clubbing (+) Limited right shoulder ROM NEURO: alert & oriented x 3 with fluent speech, no focal motor/sensory deficits BREAST: S/p right lumpectomy: surgical incision healed well. No palpable mass, nodules or adenopathy bilaterally. Breast exam benign.   LABORATORY DATA:  I have reviewed the data as listed CBC Latest Ref Rng & Units 02/15/2021 11/18/2020 09/15/2020  WBC 4.0 - 10.5 K/uL 5.0 5.6 5.6  Hemoglobin 12.0 - 15.0 g/dL 13.4 13.7 12.5  Hematocrit 36.0 - 46.0 % 41.9 42.7 38.3  Platelets 150 - 400 K/uL 234 266 314.0     CMP Latest Ref Rng & Units 02/15/2021 11/18/2020 09/15/2020  Glucose 70 - 99 mg/dL 119(H) 79 90  BUN 8 - 23 mg/dL _0 Creatinine 0.44 - 1.00 mg/dL 0.81 0.83 0.66  Sodium 135 - 145 mmol/L 144 141 141  Potassium 3.5 - 5.1 mmol/L 3.7 4.1 3.6  Chloride 98 - 111 mmol/L 105 103 101  CO2 22 - 32 mmol/L 29 28 34(H)  Calcium 8.9 - 10.3 mg/dL 9.3 9.7 9.1  Total Protein 6.5 - 8.1 g/dL 7.0 7.0 6.9  Total Bilirubin 0.3 - 1.2 mg/dL 1.0 0.9 0.4  Alkaline Phos 38 - 126 U/L 128(H) - 145(H)  AST 15 - 41 U/L _1 ALT 0 - 44 U/L 8 7 53(H)   Upper endoscopy by Dr Carlean Purl 10/12/20 IMPRESSION - A single gastric polyp. Resected and retrieved. ? if some bleeding from here but seems unlikely to cause iron deficiency anemia - Erythematous and friable (with contact  bleeding) mucosa in the stomach. Biopsied. - The examination was otherwise normal. - Gastroesophageal flap valve classified as Hill Grade I (prominent fold, tight to endoscope).   Colonoscopy by Dr Carlean Purl 10/12/20 IMPRESSION - The examined portion of the ileum was normal. - Three diminutive polyps in the sigmoid colon and in the cecum, removed with a cold snare. Resected and retrieved. - Diverticulosis in the sigmoid colon. - The examination was otherwise normal on direct and retroflexion views. - No cause of iron-deficiency found   Diagnosis  1. Surgical [P], gastric antrum polyp - CHRONIC ACTIVE GASTRITIS - H. PYLORI ORGANISMS PRESENT - NO INTESTINAL METAPLASIA IDENTIFIED - SEE COMMENT 2. Surgical [P], gastric antrum and gastric body - CHRONIC ACTIVE GASTRITIS - H. PYLORI ORGANISMS PRESENT - NO INTESTINAL METAPLASIA IDENTIFIED - SEE COMMENT 3. Surgical [P], colon, cecum, sigmoid, polyp (3) - TUBULAR ADENOMA (3 OF 5 FRAGMENTS) - BENIGN COLONIC MUCOSA (2 OF 5 FRAGMENTS) - NO HIGH GRADE DYSPLASIA OR MALIGNANCY IDENTIFIED Microscopic Comment 2. Warthin-Starry stain is performed to determine the possibility of the presence of Helicobacter pylori. Organisms of Helicobacter pylori are identified on the Warthin-Starry stain.   RADIOGRAPHIC STUDIES: I have personally reviewed the radiological images as listed and agreed with the findings in the report. No results found.   ASSESSMENT & PLAN:  Kourtney Terriquez is a 70 y.o. female with    1.Malignant neoplasm of upper-outer quadrant of right breast, StageIA,pT1b,N0,M0, ER/PR+, HER2+, GradeII -She was diagnosed in 11/2018. She is s/p right breastlumpectomyand SLNB and adjuvant radiation. She was treated with adjuvant chemo (poorly tolerated Taxol and Herceptin, d/c after 6 weeks), Kadcyla for 1 month, then Herceptin only until 12/2019.  -I started her on antiestrogen therapy with Exemestane in 06/2019. She has tolerated  moderately due to known joint pain worsening in shoulders and knees. She is willing to continue for now. -She is clinically doing well. Lab reviewed, her CBC and CMP are within normal limits except BG 119, Alk Phos 128. Her physical exam and her 12/2020 mammogram were unremarkable. There is no clinical concern for recurrence.  -Continue surveillance. Next mammogram in 12/2021.  -Continue Exemestane.  -F/u in 6 months   2. Anemia,iron deficiency -Last colonoscopy was in 04/2012 which wasunremarkable except diverticulosis.  -She had partial colectomy in the past due to severe diverticulosis. -This is possiblyiron deficiency from diverticulosis.She denies any current bleeding.  -Her workupin early 2020showed iron 17, ferritin 4, normal retic ct, folate Vitamin B12 and SPEP panels. -Her 10/12/20 Colonoscopy/Upper Endoscopy with Dr Carlean Purl showed Gastritis, H. Pylorie, Tubular adenomas, but no other cause or source of GI bleeding.  -She received iv iron 11/26/18 and 12/12/18. Anemia has resolvedsince 08/2020. Will continue to monitor her iron level  3. Comorbidities: Trigeminal neuralgia, Osteoarthritis and arthralgia -5/26/20brain and neck MRIwere stable. -Has not been completely controlled but managing with occasionally worsened flares.  -She has oxycodone which she can use for significant pain. Otherwise she is on Gabapentin 643m 7 tabs a day.recent bunion which she was offered surgery for. I recommend she continue to f/u with PCP, Neurologist and Orthopedist.   4.PeripheralNeuropathy, G1-2 -Secondary to chemoTaxolstarted aftercycle 3. -On Robaxin, B12. High doseGabapentin dose not help. -Overall improved lately off chemo, but recently worsened in her feet with pain 7-8/10. Rarely present in her hands.  -I discussed option of neuropathy clinical trail as possible treatment given she is off chemo. She is not ready to stop Gabapentin for this clinical trail. She declined for  now.  -I also discussed option of [facility] for possible treatment or acupunture.   5. Osteopenia  -Her baseline DEXA from 07/2019 shows osteopenia with lowest T-score -1.1 at left hip.  -Will continue to monitor on AI as this can weaken her bones. Repeat every 2 years.  -I recommend she start OTC calcium and Vitamin D.Will repeat DEXA in late 2022 or with next Mammogram in 12/2021    PLAN: -Lab reviewed, she is clinically doing well, no concern for recurrence -Continue exemestane -Lab and follow-up  in 6 months -Fill out FMLA paperwork for days off work pertaining to office visits   No problem-specific [place] notes found for this encounter.   No orders of the defined types were placed in this encounter.  All questions were answered. The patient knows to call the clinic with any problems, questions or concerns. No barriers to learning was detected. The total time spent in the appointment was 30 minutes.     Summer Merle, MD 02/15/2021   I, Joslyn Devon, am acting as scribe for Summer Merle, MD.   I have reviewed the above documentation for accuracy and completeness, and I agree with the above.

## 2021-02-15 ENCOUNTER — Encounter: Payer: Self-pay | Admitting: Hematology

## 2021-02-15 ENCOUNTER — Inpatient Hospital Stay: Payer: Federal, State, Local not specified - PPO

## 2021-02-15 ENCOUNTER — Inpatient Hospital Stay: Payer: Federal, State, Local not specified - PPO | Attending: Hematology | Admitting: Hematology

## 2021-02-15 ENCOUNTER — Ambulatory Visit: Payer: Federal, State, Local not specified - PPO | Admitting: Internal Medicine

## 2021-02-15 ENCOUNTER — Encounter: Payer: Self-pay | Admitting: Internal Medicine

## 2021-02-15 ENCOUNTER — Other Ambulatory Visit: Payer: Self-pay

## 2021-02-15 VITALS — BP 137/57 | HR 74 | Temp 98.1°F | Resp 18 | Ht 65.0 in | Wt 190.2 lb

## 2021-02-15 DIAGNOSIS — M85852 Other specified disorders of bone density and structure, left thigh: Secondary | ICD-10-CM | POA: Insufficient documentation

## 2021-02-15 DIAGNOSIS — C50411 Malignant neoplasm of upper-outer quadrant of right female breast: Secondary | ICD-10-CM | POA: Diagnosis not present

## 2021-02-15 DIAGNOSIS — Z170 Estrogen receptor positive status [ER+]: Secondary | ICD-10-CM | POA: Insufficient documentation

## 2021-02-15 DIAGNOSIS — Z79811 Long term (current) use of aromatase inhibitors: Secondary | ICD-10-CM | POA: Diagnosis not present

## 2021-02-15 DIAGNOSIS — Z79899 Other long term (current) drug therapy: Secondary | ICD-10-CM | POA: Insufficient documentation

## 2021-02-15 DIAGNOSIS — M8580 Other specified disorders of bone density and structure, unspecified site: Secondary | ICD-10-CM

## 2021-02-15 DIAGNOSIS — Z923 Personal history of irradiation: Secondary | ICD-10-CM | POA: Insufficient documentation

## 2021-02-15 DIAGNOSIS — B370 Candidal stomatitis: Secondary | ICD-10-CM | POA: Diagnosis not present

## 2021-02-15 DIAGNOSIS — D509 Iron deficiency anemia, unspecified: Secondary | ICD-10-CM

## 2021-02-15 LAB — CBC WITH DIFFERENTIAL (CANCER CENTER ONLY)
Abs Immature Granulocytes: 0.02 10*3/uL (ref 0.00–0.07)
Basophils Absolute: 0 10*3/uL (ref 0.0–0.1)
Basophils Relative: 1 %
Eosinophils Absolute: 0.1 10*3/uL (ref 0.0–0.5)
Eosinophils Relative: 2 %
HCT: 41.9 % (ref 36.0–46.0)
Hemoglobin: 13.4 g/dL (ref 12.0–15.0)
Immature Granulocytes: 0 %
Lymphocytes Relative: 34 %
Lymphs Abs: 1.7 10*3/uL (ref 0.7–4.0)
MCH: 29.3 pg (ref 26.0–34.0)
MCHC: 32 g/dL (ref 30.0–36.0)
MCV: 91.5 fL (ref 80.0–100.0)
Monocytes Absolute: 0.3 10*3/uL (ref 0.1–1.0)
Monocytes Relative: 7 %
Neutro Abs: 2.8 10*3/uL (ref 1.7–7.7)
Neutrophils Relative %: 56 %
Platelet Count: 234 10*3/uL (ref 150–400)
RBC: 4.58 MIL/uL (ref 3.87–5.11)
RDW: 13.2 % (ref 11.5–15.5)
WBC Count: 5 10*3/uL (ref 4.0–10.5)
nRBC: 0 % (ref 0.0–0.2)

## 2021-02-15 LAB — CMP (CANCER CENTER ONLY)
ALT: 8 U/L (ref 0–44)
AST: 15 U/L (ref 15–41)
Albumin: 3.6 g/dL (ref 3.5–5.0)
Alkaline Phosphatase: 128 U/L — ABNORMAL HIGH (ref 38–126)
Anion gap: 10 (ref 5–15)
BUN: 11 mg/dL (ref 8–23)
CO2: 29 mmol/L (ref 22–32)
Calcium: 9.3 mg/dL (ref 8.9–10.3)
Chloride: 105 mmol/L (ref 98–111)
Creatinine: 0.81 mg/dL (ref 0.44–1.00)
GFR, Estimated: >60 mL/min (ref >60)
Glucose, Bld: 119 mg/dL — ABNORMAL HIGH (ref 70–99)
Potassium: 3.7 mmol/L (ref 3.5–5.1)
Sodium: 144 mmol/L (ref 135–145)
Total Bilirubin: 1 mg/dL (ref 0.3–1.2)
Total Protein: 7 g/dL (ref 6.5–8.1)

## 2021-02-15 LAB — FERRITIN: Ferritin: 47 ng/mL (ref 11–307)

## 2021-02-15 LAB — IRON AND TIBC
Iron: 70 ug/dL (ref 41–142)
Saturation Ratios: 18 % — ABNORMAL LOW (ref 21–57)
TIBC: 380 ug/dL (ref 236–444)
UIBC: 310 ug/dL (ref 120–384)

## 2021-02-15 LAB — VITAMIN D 25 HYDROXY (VIT D DEFICIENCY, FRACTURES): Vit D, 25-Hydroxy: 23.57 ng/mL — ABNORMAL LOW (ref 30–100)

## 2021-02-15 MED ORDER — NYSTATIN 100000 UNIT/ML MT SUSP: 5.0000 mL | Freq: Three times a day (TID) | OROMUCOSAL | 0 refills | Status: DC

## 2021-02-15 NOTE — Patient Instructions (Signed)
We have sent in nystatin to take 5 mL swish in mouth for 30 seconds then spit out 3 times a day for 5 days to clear up the tongue.

## 2021-02-15 NOTE — Progress Notes (Signed)
   Subjective:   Patient ID: Summer [name], female    DOB: [birth year], 70 y.o.   MRN: [id number]  HPI The patient is a 70 YO female coming in for right sided tongue and mouth problems. She has some coating on her tongue for some days etc. Able to brush off. Some tongue sensitivity and feeling like there is a new groove on her tongue.   Review of Systems  Constitutional: Negative.   HENT: Negative.         Tongue problem  Eyes: Negative.   Respiratory:  Negative for cough, chest tightness and shortness of breath.   Cardiovascular:  Negative for chest pain, palpitations and leg swelling.  Gastrointestinal:  Negative for abdominal distention, abdominal pain, constipation, diarrhea, nausea and vomiting.  Musculoskeletal: Negative.   Skin: Negative.   Neurological: Negative.   Psychiatric/Behavioral: Negative.     Objective:  Physical Exam Constitutional:      Appearance: She is well-developed.  HENT:     Head: Normocephalic and atraumatic.     Comments: Tongue without coating, some sensitivity and some groove right lateral side, no discharge or drainage Cardiovascular:     Rate and Rhythm: Normal rate and regular rhythm.  Pulmonary:     Effort: Pulmonary effort is normal. No respiratory distress.     Breath sounds: Normal breath sounds. No wheezing or rales.  Abdominal:     General: Bowel sounds are normal. There is no distension.     Palpations: Abdomen is soft.     Tenderness: There is no abdominal tenderness. There is no rebound.  Musculoskeletal:     Cervical back: Normal range of motion.  Skin:    General: Skin is warm and dry.  Neurological:     Mental Status: She is alert and oriented to person, place, and time.     Coordination: Coordination normal.    Vitals:   02/15/21 0933  BP: 122/70  Pulse: (!) 59  Resp: 18  Temp: 98.4 F (36.9 C)  TempSrc: Oral  SpO2: 99%  Weight: 187 lb 3.2 oz (84.9 kg)  Height: 5\' 5"  (1.651 m)    This visit occurred during  the SARS-CoV-2 public health emergency.  Safety protocols were in place, including screening questions prior to the visit, additional usage of staff PPE, and extensive cleaning of exam room while observing appropriate contact time as indicated for disinfecting solutions.   Assessment & Plan:

## 2021-02-16 ENCOUNTER — Telehealth: Payer: Self-pay | Admitting: Hematology

## 2021-02-16 DIAGNOSIS — B370 Candidal stomatitis: Secondary | ICD-10-CM | POA: Insufficient documentation

## 2021-02-16 LAB — ANA, IFA COMPREHENSIVE PANEL
Anti Nuclear Antibody (ANA): NEGATIVE
ENA SM Ab Ser-aCnc: <1 AI
SM/RNP: <1 AI
SSA (Ro) (ENA) Antibody, IgG: <1 AI
SSB (La) (ENA) Antibody, IgG: <1 AI
Scleroderma (Scl-70) (ENA) Antibody, IgG: <1 AI
ds DNA Ab: <1 IU/mL

## 2021-02-16 LAB — CALCIUM: Calcium: 9.5 mg/dL (ref 8.6–10.4)

## 2021-02-16 LAB — C-REACTIVE PROTEIN: CRP: 2.1 mg/L (ref <8.0)

## 2021-02-16 LAB — SEDIMENTATION RATE: Sed Rate: 11 mm/h (ref 0–30)

## 2021-02-16 LAB — RHEUMATOID FACTOR: Rheumatoid fact SerPl-aCnc: <14 IU/mL (ref <14)

## 2021-02-16 LAB — URIC ACID: Uric Acid, Serum: 2.7 mg/dL (ref 2.5–7.0)

## 2021-02-16 NOTE — Assessment & Plan Note (Signed)
Rx nystatin swish and spit 5 mL TID for 5 days. Advised the tongue sensitivity could be coming from this.

## 2021-02-16 NOTE — Telephone Encounter (Signed)
Scheduled follow-up appointment per 6/8 los. Patient is aware.

## 2021-02-17 ENCOUNTER — Other Ambulatory Visit: Payer: Self-pay | Admitting: Hematology

## 2021-02-17 ENCOUNTER — Encounter: Payer: Self-pay | Admitting: Hematology

## 2021-03-02 ENCOUNTER — Encounter: Payer: Self-pay | Admitting: *Deleted

## 2021-03-08 ENCOUNTER — Telehealth: Payer: Self-pay | Admitting: Neurology

## 2021-03-08 NOTE — Telephone Encounter (Signed)
FYI for Sharyn Lull, RN pt called back and accepted the appointment for 06-30 for 9:00.  Pt aware her check in time is 8:30

## 2021-03-09 ENCOUNTER — Ambulatory Visit: Payer: Federal, State, Local not specified - PPO | Admitting: Neurology

## 2021-03-09 ENCOUNTER — Encounter: Payer: Self-pay | Admitting: Neurology

## 2021-03-09 VITALS — BP 131/74 | HR 73 | Ht 65.0 in | Wt 188.0 lb

## 2021-03-09 DIAGNOSIS — G500 Trigeminal neuralgia: Secondary | ICD-10-CM

## 2021-03-09 MED ORDER — DULOXETINE HCL 60 MG PO CPEP: 60.0000 mg | ORAL_CAPSULE | Freq: Every day | ORAL | 11 refills | Status: DC

## 2021-03-09 NOTE — Progress Notes (Signed)
Chief Complaint  Patient presents with   Follow-up    Room 16 - alone. Follow up for trigeminal neuralgia. She is concerned about recent right-sided tongue swelling. Currently taking gabapentin 618m, two tabs TID. She would like to discuss her medications.       ASSESSMENT AND PLAN  Summer [name] a 70y.o. female   Right trigeminal neuralgia  Continue gabapentin 600 mg 2 tablets 3 times a day, refill her prescription  Add on Cymbalta 60 mg daily  DIAGNOSTIC DATA (LABS, IMAGING, TESTING) - I reviewed patient records, labs, notes, testing and imaging myself where available.  MRI lumbar spine August 18, 2020:  At L3-4, L4-5: disc bulging and facet hypertrophy with severe right and moderate left foraminal stenosis. - At L5-S1: disc bulging and facet hypertrophy with moderate left foraminal stenosis. - At L1-2: disc bulging and facet hypertrophy with moderate right foraminal stenosis.  HISTORICAL HISTORY OF PRESENT ILLNESS:  Summer [name] a 70year old female presenting for follow-up for trigeminal neuralgia. She has had 2 gamma knife procedures most recent in 2019. She thinks she isn't feeling as good as she had hoped, in fact she thinks it may have made her worse. She still has pain to right side of her face. Pain under her eye, and pain under her chin. Her tongue sometimes feels like sandpaper. She is taking gabapentin 600 mg capsules, three in the morning, three midday, two at bedtime. When she takes the gabapentin it settles the pain down, making it manageable. Rarely she will take lamictal at bedtime. She reports 25% of the day she is pain free. In the morning is the worst pain. She doesn't like Oxtellar, because it doesn't work in her opinion. In the past she has tried lyrica, gabapentin, tegretol, trileptal, and cymbalta. 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    HISTORY 10/10/17 [name] a 70years old right-handed African  American female, referred by her primary care physician Dr. JStevie Kernfor evaluation of right facial pain   She was diagnosed with right trigeminal neuralgia in 2006, presenting with severe electronics shooting pain at her right upper molar, she [facility] Dr. JHarrison Monsaround 2006, which has helped the severe pain, but she continued to have annoying almost constant pain at her right face, she describes hot cold sensitivity over right face, touching of right forehead region will also set of her right facial pain, she felt right tongue swelling, she bites on her right tongue sometimes, she works at customer service, on the phone many hours each day, with prolonged talking, she felt sandpaper was rubbing against her right tongue, also complains of right upper and lower lip numbness, intermittent electronics shooting pain,   Over the years, she has tried different medication, was evaluated by our office Dr. DBrett [name] 2008, she has tried gabapentin, Lyrica, Cymbalta, Trileptal, currently is taking Tegretol 200 mg twice a day, initially it helps her some, but failed to help work consistently, She denied visual change, recent few months of left lower back, left hip pain, radiating to left posterior thigh, to her left leg and left foot, difficulty walking after prolonged sitting  She complains of left low back pain, left hip pain, radiating pain to left foot, like walking on a dead, burning sensation, she continued to complains of right facial constant achy pain, there was no significant change after started on Oxtellar xR  300 mg every day, no significant side effects,   I have reviewed MRI, mild small vessel disease, no significant abnormality otherwise,   She had MRI lumbar spine (without) demonstrating: L5-S1: facet hypertrophy with moderate right and severe left foraminal stenosis  L4-5: pseudo disc bulging and facet hypertrophy with moderate right  and mild left foraminal stenosis. There is 2 mm anterior spondylolisthesis of L4 on L5.  Degenerative spondylosis and disc bulging L1-2 and L4-5.   UPDATE Sep 30 2014:YY She felt sand paper sensation in her right tongue, flashing sensation, last 1-2 weeks, only take gabapentin 392m tid now. She also complains of snoring, frequent wakening during the nighttime, excessive sleepiness at daytime, F ESS score is 37, ESS is 9   UPDATE 01/26/2017[name] 70year old female returns for follow-up. She has a history of right trigeminal neuralgia is currently on Neurontin 300 mg 3 times a day has breakthrough pain. 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 She occasionally will have upper and lower lip numbness. 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,   Update March 01 2016:YY She presented to the emergency room February 21 2016 for nausea, unexpected fall, night before that because severe facial pain, she took extra dose of lamotrigine, made it to 250 mg that particular day, laboratory evaluation showed mildly low potassium of CMP WAS GIVEN SUPPLEMENT, NORMAL CBC, lamotrigine level in June 15 was 15.6,  For a while, she had severe right facial pain, was taking gabapentin 300 mg 2 tablets every 2 hours, 3 tablets at night, about 11 tablets each day 3300 mg every day plus lamotrigine 100 mg twice a day,    Lamotrigine 100 mg twice a day has been very helpful, it has taking care of her long bothered right tongue sandpaper sensation. She is now taking gabapentin 300 mg 3/2/3 tablets each day, she has been  off lamotrigine since February 26 2016, the sandpaper sensation on the right side of the tongue came back, she is now taking Cymbalta 30 mg 3 times a day   She has been complains 3 weeks history of urinary urgency, difficulty initiating urine, UA showed mild UTI, was treated with Cipro without improving her symptoms   UPDATE May 10 2016:YY She was evaluated by Dr. TSalomon Fickin July 2017, is planning on to have gamma knife in early September, she complains of mild memory loss, difficulty handling her computer tasks sometimes, there was no family history of memory loss, she also complains of episode, right after she get out of the bed,  She is on polypharmacy, now taking lamotrigine 100 mg twice a day, gabapentin 300 mg 4 in the morning, 2 at lunch time, 4 tablets at nighttime. She also has obesity, snoring, excessive daytime fatigue and sleepiness   We have personally reviewed CT head without contrast in June 2017, no acute abnormality, MRI of the cervical in 2008, multilevel degenerative changes, most severe at C5-6, C6-7, with herniated disc, mild canal stenosis, mild cord signal changes.   Laboratory evaluation in 2017, elevated WBC 11 point 8, hemoglobin  of 13, no significant abnormality on BMP   UPDATE Apr 02 2017:YY We have personally reviewed MRI of the brain in April 2018 generalized atrophy mild supratentorium small vessel disease MRI of cervical spine April, multilevel degenerative changes most severe at C6-7, mild canal stenosissignificant foraminal narrowing or signal changes,   She had a, knife by [facility] Dr. Angelene Giovanni in September 2017 she denies significant improvement, now cold air still trigger her right facial pain, bleeding from right temporal region along the right face, right jaw, electronic shooting pain, to her right lower molar region, she has intermittent left hand tremor, driving,   She is now taking Lamictal 100 mg 3 tablets each day, gabapentin 600 mg 5 tablets each day,  despite combination therapy, she remains symptomatic, she is able to go to work.  She is on the phone 10 hours a day, mild memory loss.  I reviewed laboratory evaluation in 2018, negative troponin, CBC with hemoglobin of 11.9, CMP, creatinine of 0.84, ESR, mild elevated C reactive protein 7.8, lamotrigine 12.9, TSH 1.2 7,   UPDATE Sept 1 2020: She was diagnosed with right breast cancer, had right lobectomy followed by chemo and radiation therapy, since April 2020, she noticed increased bilateral feet paresthesia, and also intermittent bilateral finger paresthesia, despite taking gabapentin 600 mg up to 3 times a day, her right trigeminal pain is under good control with current dose, she also has frequent lower extremity muscle spasm, has tried muscle relaxant methocarbamol with limited help  Virtual Visit via Video Aug 10, 2020 She has left knee replacement on Jun 20 2020, recovering well, still in the process of PT, out of work until Jan 2021.  She complains of left low back pain, radiating pain to left hip for 6 months, even before her left knee replacement, now made it worse by prolonged sitting,  I personally reviewed x-ray of lumbar in April 2021, significant multilevel degenerative disease, scoliosis  Her right facial trigeminal pain is under reasonable control with current dose of gabapentin 600 mg 2 tablets 3 times a day,  UPDATE March 09 2021: She complains of mild right tongue discomfort, feel swollen, despite taking gabapentin 663m 2 tab tid,        PHYSICAL EXAM:   Vitals:   03/09/21 0855  BP: 131/74  Pulse: 73  Weight: 188 lb (85.3 kg)  Height: _0  (1.651 m)   Not recorded     Body mass index is 31.28 kg/m.  PHYSICAL EXAMNIATION:  Gen: NAD, conversant, well nourised, well groomed                     Cardiovascular: Regular rate rhythm, no peripheral edema, warm, nontender. Eyes: Conjunctivae clear without exudates or hemorrhage Neck: Supple, no carotid  bruits. Pulmonary: Clear to auscultation bilaterally   NEUROLOGICAL EXAM:  MENTAL STATUS: Speech/Cognition: Depressed looking middle-aged female Awake, alert, oriented to history taking and casual conversation  CRANIAL NERVES: CN II: Visual fields are full to confrontation. Pupils are round equal and briskly reactive to light. CN III, IV, VI: extraocular movement are normal. No ptosis. CN V: Facial sensation is intact to light touch CN VII: Face is symmetric with normal eye closure  CN VIII: Hearing is normal to causal conversation. CN IX, X: Phonation is normal. CN XI: Head turning and shoulder shrug are intact  MOTOR: There is no pronator drift of out-stretched arms. Muscle bulk and tone are normal. Muscle strength is normal.  REFLEXES: Reflexes are 2+  and symmetric at the biceps, triceps, knees, and ankles. Plantar responses are flexor.  SENSORY: Intact to light touch, pinprick and vibratory sensation are intact in fingers and toes.  COORDINATION: There is no trunk or limb dysmetria noted.  GAIT/STANCE: Steady  REVIEW OF SYSTEMS:  Full 14 system review of systems performed and notable only for as above All other review of systems were negative.   ALLERGIES: Allergies  Allergen Reactions   Keflex [Cephalexin] Anaphylaxis and Other (See Comments)    Caused violent dizziness and syncope   Clindamycin Hcl Itching    HOME MEDICATIONS: Current Outpatient Medications  Medication Sig Dispense Refill   celecoxib (CELEBREX) 200 MG capsule Take 1 capsule (200 mg total) by mouth 2 (two) times daily. 30 capsule 1   diclofenac Sodium (VOLTAREN) 1 % GEL Apply 2 g topically 4 (four) times daily. 150 g 0   DULoxetine (CYMBALTA) 60 MG capsule Take 1 capsule (60 mg total) by mouth daily. 30 capsule 11   exemestane (AROMASIN) 25 MG tablet TAKE 1 TABLET (25 MG TOTAL) BY MOUTH DAILY AFTER BREAKFAST. 90 tablet 1   gabapentin (NEURONTIN) 600 MG tablet TAKE 2 TABLETS BY MOUTH 3 TIMES A  DAY. Please call [phone] to schedule an appt. 540 tablet 4   nystatin (MYCOSTATIN) 100000 UNIT/ML suspension Take 5 mLs (500,000 Units total) by mouth in the morning, at noon, and at bedtime. 120 mL 0   ondansetron (ZOFRAN) 4 MG tablet Take 4 mg by mouth every 6 (six) hours as needed.     No current facility-administered medications for this visit.    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   Cataract    bilateral - no sx as of 09/23/2020)   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   Complication of anesthesia    Pt states having some "difficulty waking up"   Constipation 07/01/2018   Costochondritis 05/04/2014   Diverticulitis of colon 06/13/2007   S/p segmental colectomy for perforated diverticulitis    Diverticulosis    Facial pain 10/06/2015   Fibromyalgia    Foot sprain, left, initial encounter 08/31/2016   Glossitis 02/02/2016   Headache 7/53/0051   Helicobacter pylori gastritis 10/2020   HEMATURIA UNSPECIFIED 06/25/2008   Qualifier: Diagnosis of  By: Sherren Mocha MD, Dellis Filbert A    History of kidney stones    History of shingles 2012--  no residual pain   Hx of adenomatous colonic polyps 10/2020   Itching 10/13/2018   Left eye pain 07/01/2018   Left ureteral calculus    LOC OSTEOARTHROS NOT SPEC PRIM/SEC OTH [facility] SITE 06/27/2009   Qualifier: Diagnosis of  By: Sherren Mocha MD, Dellis Filbert A    Malignant neoplasm of upper-outer quadrant of right breast in female, estrogen receptor positive ([place]) 11/17/2018   MCI (mild cognitive impairment)    Memory loss 12/21/2016   Morbid obesity ([place]) 11/09/2010   Pain of right side of body 08/31/2016   Personal history of chemotherapy    Personal history of radiation  therapy    Pruritus 10/26/2016   Renal disorder    hx of   Right sided temporal headache 12/21/2016   Tremor 12/21/2016  Trigeminal neuralgia RIGHT   Trigeminal neuralgia of right side of face 11/30/2010   Trochanteric bursitis, right hip 10/26/2016   Urgency of urination    Weight loss 07/22/2017   Zoster without complications 03/11/6377    PAST SURGICAL HISTORY: Past Surgical History:  Procedure Laterality Date   BREAST BIOPSY Right 11/11/2018   BREAST LUMPECTOMY Right 11/28/2018   BREAST LUMPECTOMY WITH RADIOACTIVE SEED AND SENTINEL LYMPH NODE BIOPSY Right 11/28/2018   Procedure: RIGHT BREAST LUMPECTOMY WITH RADIOACTIVE SEED AND RIGHT AXILLARY SENTINEL LYMPH NODE BIOPSY;  Surgeon: Excell Seltzer, MD;  Location: [place];  Service: General;  Laterality: Right;   CEREBRAL MICROVASCULAR DECOMPRESSION  07-05-2006   RIGHT TRIGEMINAL NERVE   COLONOSCOPY  2013   CG-MAC-moviprep (exc)-normal -mild tics   CYSTOSCOPY/RETROGRADE/URETEROSCOPY/STONE EXTRACTION WITH BASKET  07/04/2012   Procedure: CYSTOSCOPY/RETROGRADE/URETEROSCOPY/STONE EXTRACTION WITH BASKET;  Surgeon: Claybon Jabs, MD;  Location: [facility];  Service: Urology;  Laterality: Left;   EXPLORATORY LAPAROTOMY/ RESECTION MID TO DISTAL SIGMOID AND PROXIMAL RECTUM/ END PROXIMAL SIGMOID COLOSTOMY  07-17-2006   PERFORATED DIVERTICULITIS WITH PERITONITIS   EXTRACORPOREAL SHOCK WAVE LITHOTRIPSY Left 01/19/2021   Procedure: LEFT EXTRACORPOREAL SHOCK WAVE LITHOTRIPSY (ESWL);  Surgeon: Bjorn Loser, MD;  Location: [facility];  Service: Urology;  Laterality: Left;   gamma knife  05/17/2016   for trigeminal neuralgia, [facility], Dr Salomon Fick   gamma knife  2019   IR CV LINE INJECTION  10/27/2019   PORTACATH PLACEMENT Left 11/28/2018   Procedure: INSERTION PORT-A-CATH WITH ULTRASOUND;  Surgeon: Excell Seltzer, MD;  Location: [place];  Service: General;  Laterality: Left;   portacath removal     RESECTION  COLOSTOMY/ CLOSURE COLOSTOMY WITH COLOPROCTOSTOMY  02-20-2007   TONSILLECTOMY  AS CHILD   TOTAL KNEE ARTHROPLASTY Left 06/20/2020   Procedure: LEFT TOTAL KNEE ARTHROPLASTY;  Surgeon: Leandrew Koyanagi, MD;  Location: [place];  Service: Orthopedics;  Laterality: Left;   URETEROSCOPY  07/04/2012   Procedure: URETEROSCOPY;  Surgeon: Claybon Jabs, MD;  Location: [facility];  Service: Urology;  Laterality: Left;   VAGINAL HYSTERECTOMY  1998   Partial   WISDOM TOOTH EXTRACTION      FAMILY HISTORY: Family History  Problem Relation Age of Onset   Heart disease Maternal Grandmother    Diabetes Mother    Breast cancer Maternal Aunt    Lupus Cousin    Colon cancer Neg Hx    Esophageal cancer Neg Hx    Rectal cancer Neg Hx    Stomach cancer Neg Hx    Colon polyps Neg Hx     SOCIAL HISTORY: Social History   Socioeconomic History   Marital status: Married    Spouse name: Not on file   Number of children: 1   Years of education: College   Highest education level: Not on file  Occupational History    Employer: UNEMPLOYED   Occupation: HUMAN RESOURCES     Employer: [employer]  Tobacco Use   Smoking status: Never   Smokeless tobacco: Never  Vaping Use   Vaping Use: Never used  Substance and Sexual Activity   Alcohol use: Yes    Comment: rarely   Drug use: No   Sexual activity: Not on file  Other Topics Concern   Not on file  Social History Narrative   Not on file   Social Determinants of Health   Financial Resource Strain: Not on file  Food Insecurity: Not on file  Transportation  Needs: Not on file  Physical Activity: Not on file  Stress: Not on file  Social Connections: Not on file  Intimate Partner Violence: Not on file      Marcial Pacas, M.D. Ph.D.  [facility] [address], [place], [place] [address] Ph: [phone] Fax: [phone]  CC:  Hoyt Koch, MD [place],  [place] [id number]   Hoyt Koch, MD

## 2021-03-31 ENCOUNTER — Other Ambulatory Visit: Payer: Self-pay | Admitting: Neurology

## 2021-04-04 ENCOUNTER — Encounter: Payer: Self-pay | Admitting: Internal Medicine

## 2021-04-11 ENCOUNTER — Encounter: Payer: Self-pay | Admitting: Internal Medicine

## 2021-04-11 NOTE — Telephone Encounter (Signed)
Tomorrow is fine

## 2021-05-08 ENCOUNTER — Ambulatory Visit: Payer: Federal, State, Local not specified - PPO | Admitting: Neurology

## 2021-08-17 ENCOUNTER — Inpatient Hospital Stay: Payer: Federal, State, Local not specified - PPO | Attending: Hematology | Admitting: Hematology

## 2021-08-17 ENCOUNTER — Inpatient Hospital Stay: Payer: Federal, State, Local not specified - PPO

## 2021-08-17 ENCOUNTER — Encounter: Payer: Self-pay | Admitting: Hematology

## 2021-08-17 ENCOUNTER — Other Ambulatory Visit: Payer: Self-pay

## 2021-08-17 VITALS — BP 137/43 | HR 70 | Temp 98.5°F | Resp 16 | Ht 65.0 in | Wt 198.3 lb

## 2021-08-17 DIAGNOSIS — G629 Polyneuropathy, unspecified: Secondary | ICD-10-CM | POA: Diagnosis not present

## 2021-08-17 DIAGNOSIS — D509 Iron deficiency anemia, unspecified: Secondary | ICD-10-CM

## 2021-08-17 DIAGNOSIS — M1990 Unspecified osteoarthritis, unspecified site: Secondary | ICD-10-CM | POA: Diagnosis not present

## 2021-08-17 DIAGNOSIS — M85852 Other specified disorders of bone density and structure, left thigh: Secondary | ICD-10-CM | POA: Diagnosis not present

## 2021-08-17 DIAGNOSIS — M8580 Other specified disorders of bone density and structure, unspecified site: Secondary | ICD-10-CM

## 2021-08-17 DIAGNOSIS — C50411 Malignant neoplasm of upper-outer quadrant of right female breast: Secondary | ICD-10-CM | POA: Insufficient documentation

## 2021-08-17 DIAGNOSIS — G500 Trigeminal neuralgia: Secondary | ICD-10-CM | POA: Diagnosis not present

## 2021-08-17 DIAGNOSIS — Z170 Estrogen receptor positive status [ER+]: Secondary | ICD-10-CM | POA: Insufficient documentation

## 2021-08-17 LAB — CMP (CANCER CENTER ONLY)
ALT: 8 U/L (ref 0–44)
AST: 17 U/L (ref 15–41)
Albumin: 3.6 g/dL (ref 3.5–5.0)
Alkaline Phosphatase: 119 U/L (ref 38–126)
Anion gap: 8 (ref 5–15)
BUN: 11 mg/dL (ref 8–23)
CO2: 29 mmol/L (ref 22–32)
Calcium: 8.9 mg/dL (ref 8.9–10.3)
Chloride: 104 mmol/L (ref 98–111)
Creatinine: 0.79 mg/dL (ref 0.44–1.00)
GFR, Estimated: >60 mL/min (ref >60)
Glucose, Bld: 89 mg/dL (ref 70–99)
Potassium: 3.8 mmol/L (ref 3.5–5.1)
Sodium: 141 mmol/L (ref 135–145)
Total Bilirubin: 1.3 mg/dL — ABNORMAL HIGH (ref 0.3–1.2)
Total Protein: 6.9 g/dL (ref 6.5–8.1)

## 2021-08-17 LAB — CBC WITH DIFFERENTIAL (CANCER CENTER ONLY)
Abs Immature Granulocytes: 0.02 10*3/uL (ref 0.00–0.07)
Basophils Absolute: 0.1 10*3/uL (ref 0.0–0.1)
Basophils Relative: 1 %
Eosinophils Absolute: 0.1 10*3/uL (ref 0.0–0.5)
Eosinophils Relative: 2 %
HCT: 40.6 % (ref 36.0–46.0)
Hemoglobin: 13.3 g/dL (ref 12.0–15.0)
Immature Granulocytes: 0 %
Lymphocytes Relative: 35 %
Lymphs Abs: 1.9 10*3/uL (ref 0.7–4.0)
MCH: 29.8 pg (ref 26.0–34.0)
MCHC: 32.8 g/dL (ref 30.0–36.0)
MCV: 91 fL (ref 80.0–100.0)
Monocytes Absolute: 0.5 10*3/uL (ref 0.1–1.0)
Monocytes Relative: 9 %
Neutro Abs: 2.8 10*3/uL (ref 1.7–7.7)
Neutrophils Relative %: 53 %
Platelet Count: 243 10*3/uL (ref 150–400)
RBC: 4.46 MIL/uL (ref 3.87–5.11)
RDW: 13.2 % (ref 11.5–15.5)
WBC Count: 5.4 10*3/uL (ref 4.0–10.5)
nRBC: 0 % (ref 0.0–0.2)

## 2021-08-17 LAB — VITAMIN D 25 HYDROXY (VIT D DEFICIENCY, FRACTURES): Vit D, 25-Hydroxy: 40.84 ng/mL (ref 30–100)

## 2021-08-17 LAB — IRON AND TIBC
Iron: 122 ug/dL (ref 41–142)
Saturation Ratios: 38 % (ref 21–57)
TIBC: 325 ug/dL (ref 236–444)
UIBC: 203 ug/dL (ref 120–384)

## 2021-08-17 LAB — FERRITIN: Ferritin: 72 ng/mL (ref 11–307)

## 2021-08-17 MED ORDER — EXEMESTANE 25 MG PO TABS: ORAL_TABLET | ORAL | 1 refills | Status: DC

## 2021-08-17 NOTE — Progress Notes (Signed)
[place]   Telephone:([phone]) [phone] Fax:(336) [phone]   Clinic Follow up Note   Patient Care Team: Hoyt Koch, MD as PCP - General (Internal Medicine) Sueanne Margarita, MD as PCP - Cardiology (Cardiology) Mauro Kaufmann, RN as Oncology Nurse Navigator Rockwell Germany, RN as Oncology Nurse Navigator Excell Seltzer, MD (Inactive) as Consulting Physician (General Surgery) Truitt Merle, MD as Consulting Physician (Hematology) Gery Pray, MD as Consulting Physician (Radiation Oncology) Alla Feeling, NP as Nurse Practitioner (Nurse Practitioner)  Date of Service:  08/17/2021  CHIEF COMPLAINT: f/u of right breast cancer  CURRENT THERAPY:  -IV iron as needed  -Exemestane 27m daily starting 06/29/19  ASSESSMENT & PLAN:  CSoila Printupis a 70y.o. female with   1. Malignant neoplasm of upper-outer quadrant of right breast, Stage IA, pT1b,N0,M0, ER/PR+, HER2+, Grade II  -She was diagnosed in 11/2018. She is s/p right breast lumpectomy and SLNB  and adjuvant radiation. She was treated with adjuvant chemo (poorly tolerated Taxol and Herceptin, d/c after 6 weeks), Kadcyla for 1 month, then Herceptin only until 12/2019.  -I started her on antiestrogen therapy with Exemestane in 06/2019. She has tolerated moderately due to known joint pain worsening in shoulders and knees. She is willing to continue for now.  -most recent mammogram 12/09/20 was benign -She is clinically doing well. Lab reviewed, her CBC WNL, CMP pending. Her physical exam was unremarkable. There is no clinical concern for recurrence.  -Continue surveillance. Next mammogram in 12/2021.  -Continue Exemestane.  -F/u in 6 months    2. Anemia, iron deficiency  -Last colonoscopy was in 04/2012 which was unremarkable except diverticulosis. She is s/p partial colectomy -Her workup in early 2020 showed iron 17, ferritin 4, normal retic ct, folate Vitamin B12 and SPEP panels. -Her 10/12/20  Colonoscopy/Upper Endoscopy with Dr GCarlean Purlshowed Gastritis, H. Pylorie, Tubular adenomas, but no other cause or source of GI bleeding.  -She received iv iron 11/26/18 and 12/12/18. Anemia has resolved since 08/2020. Will continue to monitor her iron level   3. Comorbidities: Osteoarthritis and arthralgia -02/03/19 brain and neck MRI were stable.  -Has not been completely controlled but managing with occasionally worsened flares.    4. Peripheral Neuropathy, G1-2; Trigeminal neuralgia -Secondary to chemo Taxol started after cycle 3.  -On Robaxin, B12. High dose Gabapentin dose not help.  -she is currently on gabapentin 1200 mg TID and cymbalta 60 mg daily, managed by Dr. YKrista Blue Overall controlled, but she still has some symptoms. -I previously discussed option of neuropathy clinical trail. She declined for now.    5. Osteopenia  -Her baseline DEXA from 07/2019 shows osteopenia with lowest T-score -1.1 at left hip.  -Will continue to monitor on AI as this can weaken her bones. Repeat every 2 years.  -I recommend she start OTC calcium and Vitamin D. Will repeat DEXA with next Mammogram in 12/2021      PLAN:  -Continue exemestane -mammogram and DEXA in 12/2021 -Lab and follow-up with NP Lacie in 6 months   No problem-specific Assessment & Plan notes found for this encounter.   SUMMARY OF ONCOLOGIC HISTORY: Oncology History Overview Note  Cancer Staging Malignant neoplasm of upper-outer quadrant of right breast in female, estrogen receptor positive [place] Staging form: Breast, AJCC 8th Edition - Clinical stage from 11/10/2018: Stage IA (cT1b, cN0, cM0, G2, ER+, PR+, HER2+) - Signed by FTruitt Merle MD on 11/18/2018     Malignant neoplasm of upper-outer quadrant of  right breast in female, estrogen receptor positive ([place])  11/07/2018 Mammogram   Diagnostic 11/07/18 IMPRESSION: 1. 1 x 0.7 x 0.6 cm hypoechoic mass with distortion at the 9-9:30 position of the RIGHT breast 6 cm from the nipple with  distortion in the UPPER-OUTER RIGHT breast, corresponding to the mammographic abnormality. One RIGHT axillary lymph node with borderline cortical thickness. Tissue sampling of the RIGHT breast mass and borderline RIGHT axillary lymph node recommended. ADDENDUM: Patient returned today for biopsy of a single borderline thickened lymph node in the RIGHT axilla. I could not reproduce a morphologically abnormal lymph node today, possibly resolved in the interval. Today, only normal appearing lymph nodes were identified in the RIGHT axilla. As such, the axillary ultrasound-guided biopsy was canceled.   11/10/2018 Cancer Staging   Staging form: Breast, AJCC 8th Edition - Clinical stage from 11/10/2018: Stage IA (cT1b, cN0, cM0, G2, ER+, PR+, HER2+) - Signed by Truitt Merle, MD on 11/18/2018    11/11/2018 Initial Biopsy   Diagnosis 11/11/18 Breast, right, needle core biopsy, upper outer - 9:30 o'clock position - INVASIVE DUCTAL CARCINOMA, GRADE II/III. - SEE MICROSCOPIC DESCRIPTION.   11/11/2018 Receptors her2   Results: IMMUNOHISTOCHEMICAL AND MORPHOMETRIC ANALYSIS PERFORMED MANUALLY The tumor cells are POSITIVE for Her2 (3+). Estrogen Receptor: 95%, POSITIVE, STRONG STAINING INTENSITY Progesterone Receptor: 5%, POSITIVE, STRONG STAINING INTENSITY Proliferation Marker Ki67: 20%   11/17/2018 Initial Diagnosis   Malignant neoplasm of upper-outer quadrant of right breast in female, estrogen receptor positive ([place])   11/28/2018 Surgery   RIGHT BREAST LUMPECTOMY WITH RADIOACTIVE SEED AND RIGHT AXILLARY SENTINEL LYMPH NODE BIOPSY by Dr. Excell Seltzer  11/28/18    11/28/2018 Pathology Results   Diagnosis 11/28/18  1. Breast, lumpectomy, Right w/seed - INVASIVE DUCTAL CARCINOMA, 1.6 CM, NOTTINGHAM GRADE 2 OF 3. - MARGINS OF RESECTION ARE NOT INVOLVED (CLOSEST MARGIN: LESS THAN 1 MM, ANTERIOR). - DUCTAL CARCINOMA IN SITU. - BIOPSY SITE CHANGES. - SEE ONCOLOGY TABLE. 2. Lymph node, sentinel, biopsy, right  Axillary - ONE LYMPH NODE, NEGATIVE FOR CARCINOMA (0/1). 3. Lymph node, sentinel, biopsy, right Axillary - ONE LYMPH NODE, NEGATIVE FOR CARCINOMA (0/1).   11/28/2018 Cancer Staging   Staging form: Breast, AJCC 8th Edition - Pathologic stage from 11/28/2018: Stage IA (pT1c, pN0, cM0, G2, ER+, PR+, HER2+) - Signed by Truitt Merle, MD on 12/11/2018    12/22/2018 Procedure   Baseline ECHO 12/22/18  IMPRESSIONS  1. The left ventricle has normal systolic function with an ejection fraction of 60-65%. The cavity size was normal. There is mild concentric left ventricular hypertrophy. Left ventricular diastolic parameters were normal.  2. The right ventricle has normal systolic function. The cavity was normal. There is no increase in right ventricular wall thickness.  3. Left atrial size was moderately dilated.  4. The aortic valve is tricuspid. Aortic valve regurgitation was not assessed by color flow Doppler.    01/02/2019 - 02/12/2020 Chemotherapy   Adjuvant chemo weekly Taxol with Herceptin starting 01/02/19. Will d/c after 02/13/19 and will switch to Kadcyla q3weeks starting 02/20/19 due to worsening neuropathy. Due to worsening Muscle cramps Kadcyla stopped 04/03/19 and switched to Herceptin q3weeks on 04/23/19. Completed on 02/12/20   04/09/2019 - 05/28/2019 Radiation Therapy   RT with Dr. Sondra Come 04/09/19-05/28/19   06/29/2019 -  Anti-estrogen oral therapy   Exemestane 60m daily starting 06/29/19   04/28/2020 Survivorship   SCP delivered by LCira Rue NP      INTERVAL HISTORY:  CMallorie Norrodis here for a follow up  of breast cancer. She was last seen by me on 02/15/21. She presents to the clinic alone. She reports she is doing well overall. She notes continued neuropathy in her fingers, despite gabapentin and cymbalta.   All other systems were reviewed with the patient and are negative.  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   Complication of anesthesia    Pt states having some "difficulty waking up"   Constipation 07/01/2018   Costochondritis 05/04/2014   Diverticulitis of colon 06/13/2007   S/p segmental colectomy for perforated diverticulitis    Diverticulosis    Facial pain 10/06/2015   Fibromyalgia    Foot sprain, left, initial encounter 08/31/2016   Glossitis 02/02/2016   Headache 03/06/349   Helicobacter pylori gastritis 10/2020   HEMATURIA UNSPECIFIED 06/25/2008   Qualifier: Diagnosis of  By: Sherren Mocha MD, Dellis Filbert A    History of kidney stones    History of shingles 2012--  no residual pain   Hx of adenomatous colonic polyps 10/2020   Itching 10/13/2018   Left eye pain 07/01/2018   Left ureteral calculus    LOC OSTEOARTHROS NOT SPEC PRIM/SEC OTH [facility] SITE 06/27/2009   Qualifier: Diagnosis of  By: Sherren Mocha MD, Dellis Filbert A    Malignant neoplasm of upper-outer quadrant of right breast in female, estrogen receptor positive ([place]) 11/17/2018   MCI (mild cognitive impairment)    Memory loss 12/21/2016   Morbid obesity ([place]) 11/09/2010   Pain of right side of body 08/31/2016   Personal history of chemotherapy    Personal history of radiation therapy    Pruritus 10/26/2016   Renal disorder    hx of   Right sided temporal headache 12/21/2016   Tremor 12/21/2016   Trigeminal neuralgia RIGHT   Trigeminal neuralgia of right side of face 11/30/2010   Trochanteric bursitis, right hip 10/26/2016   Urgency of urination    Weight loss 07/22/2017   Zoster without complications 0/93/8182    SURGICAL HISTORY: Past Surgical History:  Procedure Laterality Date   BREAST BIOPSY Right 11/11/2018   BREAST  LUMPECTOMY Right 11/28/2018   BREAST LUMPECTOMY WITH RADIOACTIVE SEED AND SENTINEL LYMPH NODE BIOPSY Right 11/28/2018   Procedure: RIGHT BREAST LUMPECTOMY WITH RADIOACTIVE SEED AND RIGHT AXILLARY SENTINEL LYMPH NODE BIOPSY;  Surgeon: Excell Seltzer, MD;  Location: [facility];  Service: General;  Laterality: Right;   CEREBRAL MICROVASCULAR DECOMPRESSION  07-05-2006   RIGHT TRIGEMINAL NERVE   COLONOSCOPY  2013   CG-MAC-moviprep (exc)-normal -mild tics   CYSTOSCOPY/RETROGRADE/URETEROSCOPY/STONE EXTRACTION WITH BASKET  07/04/2012   Procedure: CYSTOSCOPY/RETROGRADE/URETEROSCOPY/STONE EXTRACTION WITH BASKET;  Surgeon: Claybon Jabs, MD;  Location: [facility];  Service: Urology;  Laterality: Left;   EXPLORATORY LAPAROTOMY/ RESECTION MID TO DISTAL SIGMOID AND PROXIMAL RECTUM/ END PROXIMAL SIGMOID COLOSTOMY  07-17-2006   PERFORATED DIVERTICULITIS WITH PERITONITIS   EXTRACORPOREAL SHOCK WAVE LITHOTRIPSY Left 01/19/2021   Procedure: LEFT EXTRACORPOREAL SHOCK WAVE LITHOTRIPSY (ESWL);  Surgeon: Bjorn Loser, MD;  Location: [facility];  Service: Urology;  Laterality: Left;   gamma knife  05/17/2016   for trigeminal neuralgia, [facility], Dr Salomon Fick   gamma knife  2019   IR CV LINE INJECTION  10/27/2019   PORTACATH PLACEMENT Left 11/28/2018   Procedure: INSERTION PORT-A-CATH WITH ULTRASOUND;  Surgeon: Excell Seltzer, MD;  Location: [place];  Service: General;  Laterality: Left;   portacath removal     RESECTION COLOSTOMY/ CLOSURE COLOSTOMY WITH COLOPROCTOSTOMY  02-20-2007   TONSILLECTOMY  AS CHILD   TOTAL KNEE ARTHROPLASTY Left 06/20/2020   Procedure: LEFT TOTAL KNEE ARTHROPLASTY;  Surgeon: Leandrew Koyanagi, MD;  Location: [facility];  Service: Orthopedics;  Laterality: Left;   URETEROSCOPY  07/04/2012   Procedure: URETEROSCOPY;  Surgeon: Claybon Jabs, MD;  Location[facility];  Service: Urology;  Laterality: Left;   VAGINAL HYSTERECTOMY  1998   Partial    WISDOM TOOTH EXTRACTION      I have reviewed the social history and family history with the patient and they are unchanged from previous note.  ALLERGIES:  is allergic to keflex [cephalexin] and clindamycin hcl.  MEDICATIONS:  Current Outpatient Medications  Medication Sig Dispense Refill   celecoxib (CELEBREX) 200 MG capsule Take 1 capsule (200 mg total) by mouth 2 (two) times daily. 30 capsule 1   diclofenac Sodium (VOLTAREN) 1 % GEL Apply 2 g topically 4 (four) times daily. 150 g 0   DULoxetine (CYMBALTA) 60 MG capsule TAKE 1 CAPSULE BY MOUTH EVERY DAY 90 capsule 4   exemestane (AROMASIN) 25 MG tablet TAKE 1 TABLET (25 MG TOTAL) BY MOUTH DAILY AFTER BREAKFAST. 90 tablet 1   gabapentin (NEURONTIN) 600 MG tablet TAKE 2 TABLETS BY MOUTH 3 TIMES A DAY. Please call [phone] to schedule an appt. 540 tablet 4   nystatin (MYCOSTATIN) 100000 UNIT/ML suspension Take 5 mLs (500,000 Units total) by mouth in the morning, at noon, and at bedtime. 120 mL 0   ondansetron (ZOFRAN) 4 MG tablet Take 4 mg by mouth every 6 (six) hours as needed.     No current facility-administered medications for this visit.    PHYSICAL EXAMINATION: ECOG PERFORMANCE STATUS: 1 - Symptomatic but completely ambulatory  Vitals:   08/17/21 1433  BP: (!) 137/43  Pulse: 70  Resp: 16  Temp: 98.5 F (36.9 C)  SpO2: 98%   Wt Readings from Last 3 Encounters:  08/17/21 89.9 kg  03/09/21 85.3 kg  02/15/21 86.3 kg     GENERAL:alert, no distress and comfortable SKIN: skin color, texture, turgor are normal, no rashes or significant lesions EYES: normal, Conjunctiva are pink and non-injected, sclera clear  NECK: supple, thyroid normal size, non-tender, without nodularity LYMPH:  no palpable lymphadenopathy in the cervical, axillary  LUNGS: clear to auscultation and percussion with normal breathing effort HEART: regular rate & rhythm and no murmurs and no lower extremity edema ABDOMEN:abdomen soft, non-tender and normal  bowel sounds Musculoskeletal:no cyanosis of digits and no clubbing  NEURO: alert & oriented x 3 with fluent speech, no focal motor/sensory deficits BREAST: No palpable mass, nodules or adenopathy bilaterally. Breast exam benign.   LABORATORY DATA:  I have reviewed the data as listed CBC Latest Ref Rng & Units 08/17/2021 02/15/2021 11/18/2020  WBC 4.0 - 10.5 K/uL 5.4 5.0 5.6  Hemoglobin 12.0 - 15.0 g/dL 13.3 13.4 13.7  Hematocrit 36.0 - 46.0 % 40.6 41.9 42.7  Platelets 150 - 400 K/uL 243 234 266     CMP Latest Ref Rng & Units 08/17/2021 02/15/2021 02/15/2021  Glucose 70 - 99 mg/dL 89 -  119(H)  BUN 8 - 23 mg/dL 11 - 11  Creatinine 0.44 - 1.00 mg/dL 0.79 - 0.81  Sodium 135 - 145 mmol/L 141 - 144  Potassium 3.5 - 5.1 mmol/L 3.8 - 3.7  Chloride 98 - 111 mmol/L 104 - 105  CO2 22 - 32 mmol/L 29 - 29  Calcium 8.9 - 10.3 mg/dL 8.9 9.5 9.3  Total Protein 6.5 - 8.1 g/dL 6.9 - 7.0  Total Bilirubin 0.3 - 1.2 mg/dL 1.3(H) - 1.0  Alkaline Phos 38 - 126 U/L 119 - 128(H)  AST 15 - 41 U/L 17 - 15  ALT 0 - 44 U/L 8 - 8      RADIOGRAPHIC STUDIES: I have personally reviewed the radiological images as listed and agreed with the findings in the report. No results found.    Orders Placed This Encounter  Procedures   MM DIGITAL SCREENING BILATERAL    Standing Status:   Future    Standing Expiration Date:   08/17/2022    Order Specific Question:   Reason for Exam (SYMPTOM  OR DIAGNOSIS REQUIRED)    Answer:   screening    Order Specific Question:   Preferred imaging location?    Answer:   [facility]   DG Bone Density    Standing Status:   Future    Standing Expiration Date:   08/17/2022    Order Specific Question:   Reason for Exam (SYMPTOM  OR DIAGNOSIS REQUIRED)    Answer:   screening    Order Specific Question:   Preferred imaging location?    Answer:   [facility]    Order Specific Question:   Release to patient    Answer:   Immediate   All questions were answered. The patient knows  to call the clinic with any problems, questions or concerns. No barriers to learning was detected. The total time spent in the appointment was 30 minutes.     Truitt Merle, MD 08/17/2021   I, Wilburn Mylar, am acting as scribe for Truitt Merle, MD.   I have reviewed the above documentation for accuracy and completeness, and I agree with the above.

## 2021-08-18 ENCOUNTER — Telehealth: Payer: Self-pay | Admitting: Hematology

## 2021-08-18 NOTE — Telephone Encounter (Signed)
Scheduled follow-up appointment per 12/8 los. Patient is aware.

## 2021-08-19 ENCOUNTER — Encounter: Payer: Self-pay | Admitting: Hematology

## 2021-08-22 ENCOUNTER — Telehealth: Payer: Self-pay | Admitting: Neurology

## 2021-08-22 DIAGNOSIS — H18413 Arcus senilis, bilateral: Secondary | ICD-10-CM | POA: Diagnosis not present

## 2021-08-22 DIAGNOSIS — H2512 Age-related nuclear cataract, left eye: Secondary | ICD-10-CM | POA: Diagnosis not present

## 2021-08-22 DIAGNOSIS — H2513 Age-related nuclear cataract, bilateral: Secondary | ICD-10-CM | POA: Diagnosis not present

## 2021-08-22 DIAGNOSIS — H25013 Cortical age-related cataract, bilateral: Secondary | ICD-10-CM | POA: Diagnosis not present

## 2021-08-22 DIAGNOSIS — H25043 Posterior subcapsular polar age-related cataract, bilateral: Secondary | ICD-10-CM | POA: Diagnosis not present

## 2021-08-22 NOTE — Telephone Encounter (Signed)
NP in meeting- LVM and sent mychart msg informing pt of time change.

## 2021-09-25 ENCOUNTER — Other Ambulatory Visit: Payer: Self-pay

## 2021-09-25 ENCOUNTER — Encounter: Payer: Self-pay | Admitting: Internal Medicine

## 2021-09-25 ENCOUNTER — Ambulatory Visit (INDEPENDENT_AMBULATORY_CARE_PROVIDER_SITE_OTHER): Payer: Federal, State, Local not specified - PPO | Admitting: Internal Medicine

## 2021-09-25 VITALS — BP 128/70 | HR 75 | Resp 18 | Ht 65.0 in | Wt 198.0 lb

## 2021-09-25 DIAGNOSIS — Z0000 Encounter for general adult medical examination without abnormal findings: Secondary | ICD-10-CM

## 2021-09-25 DIAGNOSIS — G500 Trigeminal neuralgia: Secondary | ICD-10-CM

## 2021-09-25 DIAGNOSIS — Z170 Estrogen receptor positive status [ER+]: Secondary | ICD-10-CM

## 2021-09-25 DIAGNOSIS — Z13220 Encounter for screening for lipoid disorders: Secondary | ICD-10-CM | POA: Diagnosis not present

## 2021-09-25 DIAGNOSIS — C50411 Malignant neoplasm of upper-outer quadrant of right female breast: Secondary | ICD-10-CM | POA: Diagnosis not present

## 2021-09-25 DIAGNOSIS — K5900 Constipation, unspecified: Secondary | ICD-10-CM

## 2021-09-25 MED ORDER — LINACLOTIDE 145 MCG PO CAPS: 145.0000 ug | ORAL_CAPSULE | Freq: Every day | ORAL | 5 refills | Status: DC

## 2021-09-25 NOTE — Progress Notes (Signed)
° °  Subjective:   Patient ID: Summer [name], female    DOB: 1951-01-14, 71 y.o.   MRN: [id number]  HPI The patient is a 71 YO female coming in for physical.  PMH, [place], social history reviewed and updated  Review of Systems  Constitutional: Negative.   HENT: Negative.    Eyes: Negative.   Respiratory:  Negative for cough, chest tightness and shortness of breath.   Cardiovascular:  Negative for chest pain, palpitations and leg swelling.  Gastrointestinal:  Negative for abdominal distention, abdominal pain, constipation, diarrhea, nausea and vomiting.  Musculoskeletal:  Positive for arthralgias and myalgias.  Skin: Negative.   Neurological:  Positive for numbness.  Psychiatric/Behavioral: Negative.     Objective:  Physical Exam Constitutional:      Appearance: She is well-developed.  HENT:     Head: Normocephalic and atraumatic.  Cardiovascular:     Rate and Rhythm: Normal rate and regular rhythm.  Pulmonary:     Effort: Pulmonary effort is normal. No respiratory distress.     Breath sounds: Normal breath sounds. No wheezing or rales.  Abdominal:     General: Bowel sounds are normal. There is no distension.     Palpations: Abdomen is soft.     Tenderness: There is no abdominal tenderness. There is no rebound.  Musculoskeletal:        General: Tenderness present.     Cervical back: Normal range of motion.  Skin:    General: Skin is warm and dry.  Neurological:     Mental Status: She is alert and oriented to person, place, and time.     Coordination: Coordination normal.    Vitals:   09/25/21 1544  BP: 128/70  Pulse: 75  Resp: 18  SpO2: 97%  Weight: 198 lb (89.8 kg)  Height: 5\' 5"  (1.651 m)   EKG: Rate 69, axis left, interval normal, sinus, stable st or t wave changes, no significant change compared to prior 2021   This visit occurred during the SARS-CoV-2 public health emergency.  Safety protocols were in place, including screening questions prior to the  visit, additional usage of staff PPE, and extensive cleaning of exam room while observing appropriate contact time as indicated for disinfecting solutions.   Assessment & Plan:

## 2021-09-25 NOTE — Patient Instructions (Addendum)
Think about getting the covid-19 vaccine.  Keep an eye on the spot in the mouth. If this grows or does not go away let us know.   The EKG of the heart is the same as the one from 2021.

## 2021-09-26 LAB — LIPID PANEL
Cholesterol: 247 mg/dL — ABNORMAL HIGH (ref 0–200)
HDL: 65.5 mg/dL (ref >39.00)
LDL Cholesterol: 166 mg/dL — ABNORMAL HIGH (ref 0–99)
NonHDL: 181.78
Total CHOL/HDL Ratio: 4
Triglycerides: 81 mg/dL (ref 0.0–149.0)
VLDL: 16.2 mg/dL (ref 0.0–40.0)

## 2021-09-26 NOTE — Assessment & Plan Note (Signed)
Overall worsening and wants linzess rx which is done for 145 mg daily and can go up or down on dose as needed.

## 2021-09-26 NOTE — Assessment & Plan Note (Signed)
Still having significant pain from this. Taking gabapentin and cymbalta currently and seeing neurology for management. Has tried and not had success with many other agents.

## 2021-09-26 NOTE — Assessment & Plan Note (Signed)
Taking aromasin still and doing well. Seeing oncology regularly. No evidence for recurrence and gets yearly mammogram.

## 2021-09-26 NOTE — Assessment & Plan Note (Signed)
Flu shot declines. Covid-19 declines. Pneumonia declines. Shingrix declines. Tetanus up to date. Colonoscopy up to date. Mammogram up to date, pap smear aged out and dexa getting later this year. Counseled about sun safety and mole surveillance. Counseled about the dangers of distracted driving. Given 10 year screening recommendations.

## 2021-10-10 ENCOUNTER — Ambulatory Visit: Payer: Federal, State, Local not specified - PPO | Admitting: Neurology

## 2021-10-11 ENCOUNTER — Encounter: Payer: Self-pay | Admitting: Orthopaedic Surgery

## 2021-10-11 ENCOUNTER — Ambulatory Visit: Payer: Self-pay

## 2021-10-11 ENCOUNTER — Ambulatory Visit: Payer: Federal, State, Local not specified - PPO | Admitting: Orthopaedic Surgery

## 2021-10-11 ENCOUNTER — Other Ambulatory Visit: Payer: Self-pay

## 2021-10-11 ENCOUNTER — Telehealth: Payer: Self-pay

## 2021-10-11 VITALS — Ht 66.0 in | Wt 195.0 lb

## 2021-10-11 DIAGNOSIS — G8929 Other chronic pain: Secondary | ICD-10-CM

## 2021-10-11 DIAGNOSIS — M25511 Pain in right shoulder: Secondary | ICD-10-CM | POA: Diagnosis not present

## 2021-10-11 MED ORDER — LIDOCAINE HCL 1 % IJ SOLN
2.0000 mL | INTRAMUSCULAR | Status: AC | PRN
  Administered 2021-10-11: 2 mL

## 2021-10-11 MED ORDER — BUPIVACAINE HCL 0.25 % IJ SOLN
2.0000 mL | INTRAMUSCULAR | Status: AC | PRN
  Administered 2021-10-11: 2 mL via INTRA_ARTICULAR

## 2021-10-11 MED ORDER — METHYLPREDNISOLONE ACETATE 40 MG/ML IJ SUSP
80.0000 mg | INTRAMUSCULAR | Status: AC | PRN
  Administered 2021-10-11: 80 mg via INTRA_ARTICULAR

## 2021-10-11 NOTE — Progress Notes (Signed)
Office Visit Note   Patient: Summer [name]           Date of Birth: Feb 06, 1951           MRN: [id number] Visit Date: 10/11/2021              Requested by: Summer Koch, MD [address] [place],  [place] [address] PCP: Summer Koch, MD   Assessment & Plan: Visit Diagnoses:  1. Chronic right shoulder pain     Plan: Summer [name] has a multiyear history of recurrent right shoulder pain.  She has had a recent exacerbation.  She is tried Tylenol and anti-inflammatory medicines without much relief.  Films demonstrate a large inferior humeral head spur with narrowing of the joint space on the lateral film.  Both are consistent with primary osteoarthritis.  This certainly is a possibility of rotator cuff pathology but there is no superior migration of the humeral head.  Long discussion regarding all of the above.  I would suggest an intra-articular cortisone injection and monitor her response.  Obviously the definitive treatment would be shoulder replacement.  Type would be dependent upon integrity of the rotator cuff.  She did not want to consider physical therapy.  Follow-Up Instructions: Return if symptoms worsen or fail to improve.   Orders:  Orders Placed This Encounter  Procedures   XR Shoulder Right   No orders of the defined types were placed in this encounter.     Procedures: Large Joint Inj: R glenohumeral on 10/11/2021 4:31 PM Indications: pain and diagnostic evaluation Details: 25 G 1.5 in needle, posterior approach  Arthrogram: No  Medications: 2 mL lidocaine 1 %; 80 mg methylPREDNISolone acetate 40 MG/ML; 2 mL bupivacaine 0.25 % Consent was given by the patient. Immediately prior to procedure a time out was called to verify the correct patient, procedure, equipment, support staff and site/side marked as required. Patient was prepped and draped in the usual sterile fashion.      Clinical Data: No additional findings.   Subjective: Chief  Complaint  Patient presents with   Right Shoulder - Pain  Patient presents today for right shoulder pain. She said that she was seen for this last year, but I have no records of that. She has pain that is increasing with time. She has limited range of motion. She is right hand dominant. She took some oxycodone that she had at home, states that it did not help.   HPI  Review of Systems   Objective: Vital Signs: Ht _0  (1.676 m)    Wt 195 lb (88.5 kg)    BMI 31.47 kg/m   Physical Exam Constitutional:      Appearance: She is well-developed.  Pulmonary:     Effort: Pulmonary effort is normal.  Skin:    General: Skin is warm and dry.  Neurological:     Mental Status: She is alert and oriented to person, place, and time.  Psychiatric:        Behavior: Behavior normal.    Ortho Exam right shoulder with positive impingement,, positive empty can testing and minimally positive speeds sign.  Neurologically intact.  Skin intact.  Biceps muscle in normal position.  Had some pain along the anterior subacromial region but not at the [facility] joint.  Appears to have reasonable strength.  Able to place her arm slowly over her head with a circuitous arc of motion.  Limited internal rotation.  Minimal crepitation with internal and external  rotation  Specialty Comments:  No specialty comments available.  Imaging: XR Shoulder Right  Result Date: 10/11/2021 Films of the right shoulder obtained in several projections.  There is a large inferior humeral head spur and narrowing of the glenohumeral joint space on the lateral film consistent with osteoarthritis.  There are some degenerative changes at the [facility] joint with minimal hypertrophic changes.  No ectopic calcification or acute change.  Humeral head appears to be centered in the glenoid with a normal space between the humeral head and the acromion.  At the very least films are consistent with primary osteoarthritis.  This certainly is a possibility of some  rotator cuff pathology    PMFS History: Patient Active Problem List   Diagnosis Date Noted   Pain in right shoulder 10/11/2021   Kidney stones 11/18/2020   Chronic left-sided low back pain with left-sided sciatica 08/10/2020   Left hip pain 08/10/2020   Primary osteoarthritis of left knee 06/19/2020   Sensorineural hearing loss (SNHL) of both ears 06/06/2020   Bilateral arm numbness and tingling while sleeping 02/26/2020   Muscle cramps 05/12/2019   Port-A-Cath in place 01/30/2019   Anemia, iron deficiency 11/21/2018   Malignant neoplasm of upper-outer quadrant of right breast in female, estrogen receptor positive ([place]) 11/17/2018   Constipation 07/01/2018   Weight loss 07/22/2017   Memory loss 12/21/2016   Tremor 12/21/2016   Trochanteric bursitis, right hip 10/26/2016   Pain of right side of body 08/31/2016   Routine general medical examination at a health care facility 10/27/2015   Trigeminal neuralgia of right side of face 11/30/2010   Fibromyalgia 11/09/2010   Allergic rhinitis 08/26/2008   VULVA INTRAEPITHELIAL NEOPLASIA, 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   Cataract    bilateral - no sx as of 09/23/2020)   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   Complication of anesthesia    Pt states having some "difficulty waking up"   Constipation 07/01/2018   Costochondritis 05/04/2014   Diverticulitis of colon 06/13/2007   S/p segmental colectomy for perforated diverticulitis    Diverticulosis    Facial pain 10/06/2015   Fibromyalgia    Foot sprain, left, initial encounter 08/31/2016   Glossitis 02/02/2016   Headache 12/24/6061   Helicobacter pylori gastritis 10/2020   HEMATURIA  UNSPECIFIED 06/25/2008   Qualifier: Diagnosis of  By: Sherren Mocha MD, Dellis Filbert A    History of kidney stones    History of shingles 2012--  no residual pain   Hx of adenomatous colonic polyps 10/2020   Itching 10/13/2018   Left eye pain 07/01/2018   Left ureteral calculus    LOC OSTEOARTHROS NOT SPEC PRIM/SEC OTH [facility] SITE 06/27/2009   Qualifier: Diagnosis of  By: Sherren Mocha MD, Dellis Filbert A    Malignant neoplasm of upper-outer quadrant of right breast in female, estrogen receptor positive ([place]) 11/17/2018   MCI (mild cognitive impairment)    Memory loss 12/21/2016   Morbid obesity ([place]) 11/09/2010   Pain of right side of body 08/31/2016   Personal history of chemotherapy    Personal history of radiation therapy    Pruritus 10/26/2016   Renal disorder  hx of   Right sided temporal headache 12/21/2016   Tremor 12/21/2016   Trigeminal neuralgia RIGHT   Trigeminal neuralgia of right side of face 11/30/2010   Trochanteric bursitis, right hip 10/26/2016   Urgency of urination    Weight loss 07/22/2017   Zoster without complications 5/88/5027    Family History  Problem Relation Age of Onset   Heart disease Maternal Grandmother    Diabetes Mother    Breast cancer Maternal Aunt    Lupus Cousin    Colon cancer Neg Hx    Esophageal cancer Neg Hx    Rectal cancer Neg Hx    Stomach cancer Neg Hx    Colon polyps Neg Hx     Past Surgical History:  Procedure Laterality Date   BREAST BIOPSY Right 11/11/2018   BREAST LUMPECTOMY Right 11/28/2018   BREAST LUMPECTOMY WITH RADIOACTIVE SEED AND SENTINEL LYMPH NODE BIOPSY Right 11/28/2018   Procedure: RIGHT BREAST LUMPECTOMY WITH RADIOACTIVE SEED AND RIGHT AXILLARY SENTINEL LYMPH NODE BIOPSY;  Surgeon: Excell Seltzer, MD;  Location: [place];  Service: General;  Laterality: Right;   CEREBRAL MICROVASCULAR DECOMPRESSION  07-05-2006   RIGHT TRIGEMINAL NERVE   COLONOSCOPY  2013   CG-MAC-moviprep (exc)-normal -mild tics   CYSTOSCOPY/RETROGRADE/URETEROSCOPY/STONE  EXTRACTION WITH BASKET  07/04/2012   Procedure: CYSTOSCOPY/RETROGRADE/URETEROSCOPY/STONE EXTRACTION WITH BASKET;  Surgeon: Claybon Jabs, MD;  Location: [place];  Service: Urology;  Laterality: Left;   EXPLORATORY LAPAROTOMY/ RESECTION MID TO DISTAL SIGMOID AND PROXIMAL RECTUM/ END PROXIMAL SIGMOID COLOSTOMY  07-17-2006   PERFORATED DIVERTICULITIS WITH PERITONITIS   EXTRACORPOREAL SHOCK WAVE LITHOTRIPSY Left 01/19/2021   Procedure: LEFT EXTRACORPOREAL SHOCK WAVE LITHOTRIPSY (ESWL);  Surgeon: Bjorn Loser, MD;  Location: [facility];  Service: Urology;  Laterality: Left;   gamma knife  05/17/2016   for trigeminal neuralgia, [facility], Dr Salomon Fick   gamma knife  2019   IR CV LINE INJECTION  10/27/2019   PORTACATH PLACEMENT Left 11/28/2018   Procedure: INSERTION PORT-A-CATH WITH ULTRASOUND;  Surgeon: Excell Seltzer, MD;  Location: [place];  Service: General;  Laterality: Left;   portacath removal     RESECTION COLOSTOMY/ CLOSURE COLOSTOMY WITH COLOPROCTOSTOMY  02-20-2007   TONSILLECTOMY  AS CHILD   TOTAL KNEE ARTHROPLASTY Left 06/20/2020   Procedure: LEFT TOTAL KNEE ARTHROPLASTY;  Surgeon: Leandrew Koyanagi, MD;  Location: [place];  Service: Orthopedics;  Laterality: Left;   URETEROSCOPY  07/04/2012   Procedure: URETEROSCOPY;  Surgeon: Claybon Jabs, MD;  Location: [facility];  Service: Urology;  Laterality: Left;   VAGINAL HYSTERECTOMY  1998   Partial   WISDOM TOOTH EXTRACTION     Social History   Occupational History    Employer: UNEMPLOYED   Occupation: HUMAN RESOURCES     Employer: [employer]  Tobacco Use   Smoking status: Never   Smokeless tobacco: Never  Vaping Use   Vaping Use: Never used  Substance and Sexual Activity   Alcohol use: Yes    Comment: rarely   Drug use: No   Sexual activity: Not on file

## 2021-10-11 NOTE — Telephone Encounter (Signed)
Tried to call patient. No answer. LMOM that we have an opening earlier in the afternoon at 1:15pm today. Wanted to know if she could come in sooner? Ask her to call us back.

## 2021-10-16 ENCOUNTER — Encounter: Payer: Self-pay | Admitting: Internal Medicine

## 2021-10-16 NOTE — Progress Notes (Signed)
Perfect   PATIENT: Summer [name] DOB: 1951/03/13  REASON FOR VISIT: Follow up right sided trigeminal neuralgia  HISTORY FROM: Patient PRIMARY NEUROLOGIST: Summer [name]   HISTORY  Summer [name] is a 71 year old female presenting for follow-up for trigeminal neuralgia. She has had 2 gamma knife procedures most recent in 2019. She thinks she isn't feeling as good as she had hoped, in fact she thinks it may have made her worse. She still has pain to right side of her face. Pain under her eye, and pain under her chin. Her tongue sometimes feels like sandpaper. She is taking gabapentin 600 mg capsules, three in the morning, three midday, two at bedtime. When she takes the gabapentin it settles the pain down, making it manageable. Rarely she will take lamictal at bedtime. She reports 25% of the day she is pain free. In the morning is the worst pain. She doesn't like Oxtellar, because it doesn't work in her opinion. In the past she has tried lyrica, gabapentin, tegretol, trileptal, and cymbalta. 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     HISTORY 10/10/17 [name] is a 71 years old right-handed African American female, referred by her primary care physician Summer [name] for evaluation of right facial pain   She was diagnosed with right trigeminal neuralgia in 2006, presenting with severe electronics shooting pain at her right upper molar, she had microvascular decompression surgery at [facility] by Summer [name] around 2006, which has helped the severe pain, but she continued to have annoying almost constant pain at her right face, she describes hot cold sensitivity over right face, touching of right forehead region will also set of her right facial pain, she felt right tongue swelling, she bites on her right tongue sometimes, she works at customer service, on the phone many hours each day, with prolonged talking, she felt sandpaper was rubbing against her right  tongue, also complains of right upper and lower lip numbness, intermittent electronics shooting pain,   Over the years, she has tried different medication, was evaluated by our office Summer [name] in 2008, she has tried gabapentin, Lyrica, Cymbalta, Trileptal, currently is taking Tegretol 200 mg twice a day, initially it helps her some, but failed to help work consistently, She denied visual change, recent few months of left lower back, left hip pain, radiating to left posterior thigh, to her left leg and left foot, difficulty walking after prolonged sitting  She complains of left low back pain, left hip pain, radiating pain to left foot, like walking on a dead, burning sensation, she continued to complains of right facial constant achy pain, there was no significant change after started on Oxtellar xR 300 mg every day, no significant side effects,   I have reviewed MRI, mild small vessel disease, no significant abnormality otherwise,   She had MRI lumbar spine (without) demonstrating: L5-S1: facet hypertrophy with moderate right and severe left foraminal stenosis  L4-5: pseudo disc bulging and facet hypertrophy with moderate right and mild left foraminal stenosis. There is 2 mm anterior spondylolisthesis of L4 on L5.  Degenerative spondylosis and disc bulging L1-2 and L4-5.   UPDATE Sep 30 2014:YY She felt sand paper sensation in her right tongue, flashing sensation, last 1-2 weeks, only take gabapentin 334m tid now. She also complains of snoring, frequent wakening during the nighttime, excessive sleepiness at daytime, F ESS score is 37, ESS is 9   UPDATE 01/26/2017[name] 71year old female  returns for follow-up. She has a history of right trigeminal neuralgia is currently on Neurontin 300 mg 3 times a day has breakthrough pain. 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 She occasionally will have upper  and lower lip numbness. She returns for reevaluation   UPDATE February 09 2016:YY She complained increased right facial pain since April 2017, involving right V1/V2 branch, she is taking gabapentin 300 mg 4 times a day, only helped her mildly, triggered by wind blow on her right face, talking, chewing, she works as a Restaurant manager, fast food, it is very difficult for her to perform her job,   Update March 01 2016:YY She presented to the emergency room February 21 2016 for nausea, unexpected fall, night before that because severe facial pain, she took extra dose of lamotrigine, made it to 250 mg that particular day, laboratory evaluation showed mildly low potassium of CMP WAS GIVEN SUPPLEMENT, NORMAL CBC, lamotrigine level in June 15 was 15.6,  For a while, she had severe right facial pain, was taking gabapentin 300 mg 2 tablets every 2 hours, 3 tablets at night, about 11 tablets each day 3300 mg every day plus lamotrigine 100 mg twice a day,    Lamotrigine 100 mg twice a day has been very helpful, it has taking care of her long bothered right tongue sandpaper sensation. She is now taking gabapentin 300 mg 3/2/3 tablets each day, she has been off lamotrigine since February 26 2016, the sandpaper sensation on the right side of the tongue came back, she is now taking Cymbalta 30 mg 3 times a day   She has been complains 3 weeks history of urinary urgency, difficulty initiating urine, UA showed mild UTI, was treated with Cipro without improving her symptoms   UPDATE May 10 2016:YY She was evaluated by Summer [name] in July 2017, is planning on to have gamma knife in early September, she complains of mild memory loss, difficulty handling her computer tasks sometimes, there was no family history of memory loss, she also complains of episode, right after she get out of the bed,  She is on polypharmacy, now taking lamotrigine 100 mg twice a day, gabapentin 300 mg 4 in the morning, 2 at lunch time, 4 tablets at  nighttime. She also has obesity, snoring, excessive daytime fatigue and sleepiness   We have personally reviewed CT head without contrast in June 2017, no acute abnormality, MRI of the cervical in 2008, multilevel degenerative changes, most severe at C5-6, C6-7, with herniated disc, mild canal stenosis, mild cord signal changes.   Laboratory evaluation in 2017, elevated WBC 11 point 8, hemoglobin of 13, no significant abnormality on BMP   UPDATE Apr 02 2017:YY We have personally reviewed MRI of the brain in April 2018 generalized atrophy mild supratentorium small vessel disease MRI of cervical spine April, multilevel degenerative changes most severe at C6-7, mild canal stenosissignificant foraminal narrowing or signal changes,   She had a, knife by [facility] Dr. Angelene Giovanni in September 2017 she denies significant improvement, now cold air still trigger her right facial pain, bleeding from right temporal region along the right face, right jaw, electronic shooting pain, to her right lower molar region, she has intermittent left hand tremor, driving,   She is now taking Lamictal 100 mg 3 tablets each day, gabapentin 600 mg 5 tablets each day, despite combination therapy, she remains symptomatic, she is  able to go to work.  She is on the phone 10 hours a day, mild memory loss.  I reviewed laboratory evaluation in 2018, negative troponin, CBC with hemoglobin of 11.9, CMP, creatinine of 0.84, ESR, mild elevated C reactive protein 7.8, lamotrigine 12.9, TSH 1.2 7,    UPDATE Sept 1 2020: She was diagnosed with right breast cancer, had right lobectomy followed by chemo and radiation therapy, since April 2020, she noticed increased bilateral feet paresthesia, and also intermittent bilateral finger paresthesia, despite taking gabapentin 600 mg up to 3 times a day, her right trigeminal pain is under good control with current dose, she also has frequent lower extremity muscle spasm, has tried muscle relaxant  methocarbamol with limited help   Virtual Visit via Video Aug 10, 2020 She has left knee replacement on Jun 20 2020, recovering well, still in the process of PT, out of work until Jan 2021.   She complains of left low back pain, radiating pain to left hip for 6 months, even before her left knee replacement, now made it worse by prolonged sitting,   I personally reviewed x-ray of lumbar in April 2021, significant multilevel degenerative disease, scoliosis   Her right facial trigeminal pain is under reasonable control with current dose of gabapentin 600 mg 2 tablets 3 times a day,   UPDATE March 09 2021: She complains of mild right tongue discomfort, feel swollen, despite taking gabapentin 673m 2 tab tid,   Update October 17, 2021 SS: Tried the Cymbalta 60 mg daily, noted not much change Taking gabapentin 600 mg, 2 tablets 3 times daily, pain is under stable control. Has had gamma knife twice, MVD in past. 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   REVIEW OF SYSTEMS: Out of a complete 14 system review of symptoms, the patient complains only of the following symptoms, and all other reviewed systems are negative.  See HPI  ALLERGIES: Allergies  Allergen Reactions   Keflex [Cephalexin] Anaphylaxis and Other (See Comments)    Caused violent dizziness and syncope   Clindamycin Hcl Itching    HOME MEDICATIONS: Outpatient Medications Prior to Visit  Medication Sig Dispense Refill   celecoxib (CELEBREX) 200 MG capsule Take 1 capsule (200 mg total) by mouth 2 (two) times daily. 30 capsule 1   diclofenac Sodium (VOLTAREN) 1 % GEL Apply 2 g topically 4 (four) times daily. 150 g 0   exemestane (AROMASIN) 25 MG tablet TAKE 1 TABLET (25 MG TOTAL) BY MOUTH DAILY AFTER BREAKFAST. 90 tablet 1   nystatin (MYCOSTATIN) 100000 UNIT/ML suspension Take 5 mLs (500,000 Units total) by mouth in the morning, at noon, and at bedtime. 120 mL 0   ondansetron  (ZOFRAN) 4 MG tablet Take 4 mg by mouth every 6 (six) hours as needed.     DULoxetine (CYMBALTA) 60 MG capsule TAKE 1 CAPSULE BY MOUTH EVERY DAY 90 capsule 4   gabapentin (NEURONTIN) 600 MG tablet TAKE 2 TABLETS BY MOUTH 3 TIMES A DAY. Please call [phone] schedule an appt. 540 tablet 4   linaclotide (LINZESS) 145 MCG CAPS capsule Take 1 capsule (145 mcg total) by mouth daily before breakfast. 30 capsule 5   No facility-administered medications prior to visit.    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   Cataract    bilateral - no sx as of 09/23/2020)   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   Complication of anesthesia    Pt states having some "difficulty waking up"   Constipation 07/01/2018   Costochondritis 05/04/2014   Diverticulitis of colon 06/13/2007   S/p segmental colectomy for perforated diverticulitis    Diverticulosis    Facial pain 10/06/2015   Fibromyalgia    Foot sprain, left, initial encounter 08/31/2016   Glossitis 02/02/2016   Headache 8/58/8502   Helicobacter pylori gastritis 10/2020   HEMATURIA UNSPECIFIED 06/25/2008   Qualifier: Diagnosis of  By: Sherren Mocha MD, Jory Ee    History of kidney stones    History of shingles 2012--  no residual pain   Hx of adenomatous colonic polyps 10/2020   Itching 10/13/2018   Left eye pain 07/01/2018   Left ureteral calculus    LOC OSTEOARTHROS NOT SPEC PRIM/SEC OTH [facility] SITE 06/27/2009   Qualifier: Diagnosis of  By: Sherren Mocha MD, Dellis Filbert A    Malignant neoplasm of upper-outer quadrant of right breast in female, estrogen receptor positive ([place]) 11/17/2018   MCI (mild cognitive impairment)    Memory loss 12/21/2016   Morbid obesity ([place])  11/09/2010   Pain of right side of body 08/31/2016   Personal history of chemotherapy    Personal history of radiation therapy    Pruritus 10/26/2016   Renal disorder    hx of   Right sided temporal headache 12/21/2016   Tremor 12/21/2016   Trigeminal neuralgia RIGHT   Trigeminal neuralgia of right side of face 11/30/2010   Trochanteric bursitis, right hip 10/26/2016   Urgency of urination    Weight loss 07/22/2017   Zoster without complications 7/74/1287    PAST SURGICAL HISTORY: Past Surgical History:  Procedure Laterality Date   BREAST BIOPSY Right 11/11/2018   BREAST LUMPECTOMY Right 11/28/2018   BREAST LUMPECTOMY WITH RADIOACTIVE SEED AND SENTINEL LYMPH NODE BIOPSY Right 11/28/2018   Procedure: RIGHT BREAST LUMPECTOMY WITH RADIOACTIVE SEED AND RIGHT AXILLARY SENTINEL LYMPH NODE BIOPSY;  Surgeon: Excell Seltzer, MD;  Location: [place];  Service: General;  Laterality: Right;   CEREBRAL MICROVASCULAR DECOMPRESSION  07-05-2006   RIGHT TRIGEMINAL NERVE   COLONOSCOPY  2013   CG-MAC-moviprep (exc)-normal -mild tics   CYSTOSCOPY/RETROGRADE/URETEROSCOPY/STONE EXTRACTION WITH BASKET  07/04/2012   Procedure: CYSTOSCOPY/RETROGRADE/URETEROSCOPY/STONE EXTRACTION WITH BASKET;  Surgeon: Claybon Jabs, MD;  Location: [place];  Service: Urology;  Laterality: Left;   EXPLORATORY LAPAROTOMY/ RESECTION MID TO DISTAL SIGMOID AND PROXIMAL RECTUM/ END PROXIMAL SIGMOID COLOSTOMY  07-17-2006   PERFORATED DIVERTICULITIS WITH PERITONITIS   EXTRACORPOREAL SHOCK WAVE LITHOTRIPSY Left 01/19/2021   Procedure: LEFT EXTRACORPOREAL SHOCK WAVE LITHOTRIPSY (ESWL);  Surgeon: Bjorn Loser, MD;  Location: [facility];  Service: Urology;  Laterality: Left;   gamma knife  05/17/2016   for trigeminal neuralgia, [facility], Dr Salomon [name]   gamma knife  2019   IR CV LINE INJECTION  10/27/2019   PORTACATH PLACEMENT Left 11/28/2018   Procedure: INSERTION PORT-A-CATH WITH ULTRASOUND;  Surgeon:  Excell Seltzer, MD;  Location: [place];  Service: General;  Laterality: Left;   portacath removal     RESECTION COLOSTOMY/ CLOSURE COLOSTOMY WITH COLOPROCTOSTOMY  02-20-2007   TONSILLECTOMY  AS CHILD   TOTAL KNEE ARTHROPLASTY Left 06/20/2020  Procedure: LEFT TOTAL KNEE ARTHROPLASTY;  Surgeon: Leandrew Koyanagi, MD;  Location: [place];  Service: Orthopedics;  Laterality: Left;   URETEROSCOPY  07/04/2012   Procedure: URETEROSCOPY;  Surgeon: Claybon Jabs, MD;  [facility];  Service: Urology;  Laterality: Left;   VAGINAL HYSTERECTOMY  1998   Partial   WISDOM TOOTH EXTRACTION      FAMILY HISTORY: Family History  Problem Relation Age of Onset   Heart disease Maternal Grandmother    Diabetes Mother    Breast cancer Maternal Aunt    Lupus Cousin    Colon cancer Neg Hx    Esophageal cancer Neg Hx    Rectal cancer Neg Hx    Stomach cancer Neg Hx    Colon polyps Neg Hx     SOCIAL HISTORY: Social History   Socioeconomic History   Marital status: Married    Spouse name: Not on file   Number of children: 1   Years of education: College   Highest education level: Not on file  Occupational History    Employer: UNEMPLOYED   Occupation: HUMAN RESOURCES     Employer: [employer]  Tobacco Use   Smoking status: Never   Smokeless tobacco: Never  Vaping Use   Vaping Use: Never used  Substance and Sexual Activity   Alcohol use: Yes    Comment: rarely   Drug use: No   Sexual activity: Not on file  Other Topics Concern   Not on file  Social History Narrative   Not on file   Social Determinants of Health   Financial Resource Strain: Not on file  Food Insecurity: Not on file  Transportation Needs: Not on file  Physical Activity: Not on file  Stress: Not on file  Social Connections: Not on file  Intimate Partner Violence: Not on file   PHYSICAL EXAM  Vitals:   10/17/21 1544  BP: (!) 154/76  Pulse: 72  Weight: 201 lb (91.2 kg)  Height: _0  (1.676  m)   Body mass index is 32.44 kg/m.  Generalized: Well developed, in no acute distress   Neurological examination  Mentation: Alert oriented to time, place, history taking. Follows all commands speech and language fluent Cranial nerve II-XII: Pupils were equal round reactive to light. Extraocular movements were full, visual field were full on confrontational test. Facial sensation and strength were normal. Head turning and shoulder shrug  were normal and symmetric. Motor: The motor testing reveals 5 over 5 strength of all 4 extremities. Good symmetric motor tone is noted throughout.  Sensory: Sensory testing is intact to soft touch on all 4 extremities. No evidence of extinction is noted.  Coordination: Cerebellar testing reveals good finger-nose-finger and heel-to-shin bilaterally.  Gait and station: Gait is normal.  Reflexes: Deep tendon reflexes are symmetric   DIAGNOSTIC DATA (LABS, IMAGING, TESTING) - I reviewed patient records, labs, notes, testing and imaging myself where available.  Lab Results  Component Value Date   WBC 5.4 08/17/2021   HGB 13.3 08/17/2021   HCT 40.6 08/17/2021   MCV 91.0 08/17/2021   PLT 243 08/17/2021      Component Value Date/Time   NA 141 08/17/2021 1413   K 3.8 08/17/2021 1413   CL 104 08/17/2021 1413   CO2 29 08/17/2021 1413   GLUCOSE 89 08/17/2021 1413   GLUCOSE 96 08/20/2006 1433   BUN 11 08/17/2021 1413   CREATININE 0.79 08/17/2021 1413   CREATININE 0.83 11/18/2020 1628  CALCIUM 8.9 08/17/2021 1413   PROT 6.9 08/17/2021 1413   ALBUMIN 3.6 08/17/2021 1413   AST 17 08/17/2021 1413   ALT 8 08/17/2021 1413   ALKPHOS 119 08/17/2021 1413   BILITOT 1.3 (H) 08/17/2021 1413   GFRNONAA >60 08/17/2021 1413   GFRAA >60 06/13/2020 0828   GFRAA >60 04/28/2020 0937   Lab Results  Component Value Date   CHOL 247 (H) 09/25/2021   HDL 65.50 09/25/2021   LDLCALC 166 (H) 09/25/2021   LDLDIRECT 141.1 10/01/2013   TRIG 81.0 09/25/2021   CHOLHDL 4  09/25/2021   Lab Results  Component Value Date   HGBA1C 5.3 11/18/2020   Lab Results  Component Value Date   YPPJKDTO67 124 11/18/2020   Lab Results  Component Value Date   TSH 1.17 11/18/2020    ASSESSMENT AND PLAN 71 y.o. year old female   1.  Right-sided trigeminal neuralgia -Add on baclofen 10 mg twice daily for pain -Continue gabapentin 600 mg, 2 tablets 3 times daily -Continue Cymbalta 60 mg daily for now -In the past has tried Lamictal, carbamazepine, Trileptal, Lyrica; we considered trying carbamazepine again, unclear if she tried gabapentin, but potential interaction with her antiestrogen therapy Exemestane  -Follow-up in 6 months or sooner if needed  Butler Denmark, Laqueta Jean, DNP 10/17/2021, 4:16 PM [facility] [address] [place], [place] [address] [phone]

## 2021-10-17 ENCOUNTER — Encounter: Payer: Self-pay | Admitting: Hematology

## 2021-10-17 ENCOUNTER — Encounter: Payer: Self-pay | Admitting: Neurology

## 2021-10-17 ENCOUNTER — Ambulatory Visit: Payer: Federal, State, Local not specified - PPO | Admitting: Neurology

## 2021-10-17 VITALS — BP 154/76 | HR 72 | Ht 66.0 in | Wt 201.0 lb

## 2021-10-17 DIAGNOSIS — G500 Trigeminal neuralgia: Secondary | ICD-10-CM | POA: Diagnosis not present

## 2021-10-17 MED ORDER — DULOXETINE HCL 60 MG PO CPEP: ORAL_CAPSULE | ORAL | 1 refills | Status: DC

## 2021-10-17 MED ORDER — GABAPENTIN 600 MG PO TABS: ORAL_TABLET | ORAL | 4 refills | Status: DC

## 2021-10-17 MED ORDER — BACLOFEN 10 MG PO TABS: 10.0000 mg | ORAL_TABLET | Freq: Two times a day (BID) | ORAL | 5 refills | Status: DC

## 2021-10-17 NOTE — Patient Instructions (Addendum)
Add on baclofen 10 mg twice daily to help with TN pain  Keep other medications See you back in 6 months

## 2021-10-25 MED ORDER — PRAVASTATIN SODIUM 20 MG PO TABS: 20.0000 mg | ORAL_TABLET | Freq: Every day | ORAL | 3 refills | Status: DC

## 2021-10-31 ENCOUNTER — Encounter: Payer: Self-pay | Admitting: Neurology

## 2021-11-01 ENCOUNTER — Encounter: Payer: Self-pay | Admitting: *Deleted

## 2021-11-06 DIAGNOSIS — H2512 Age-related nuclear cataract, left eye: Secondary | ICD-10-CM | POA: Diagnosis not present

## 2021-11-07 DIAGNOSIS — H2511 Age-related nuclear cataract, right eye: Secondary | ICD-10-CM | POA: Diagnosis not present

## 2021-11-20 DIAGNOSIS — H2511 Age-related nuclear cataract, right eye: Secondary | ICD-10-CM | POA: Diagnosis not present

## 2021-11-20 DIAGNOSIS — H52201 Unspecified astigmatism, right eye: Secondary | ICD-10-CM | POA: Diagnosis not present

## 2021-11-27 ENCOUNTER — Encounter: Payer: Self-pay | Admitting: Hematology

## 2021-11-27 ENCOUNTER — Encounter: Payer: Self-pay | Admitting: Internal Medicine

## 2021-11-27 ENCOUNTER — Encounter: Payer: Self-pay | Admitting: Neurology

## 2021-11-30 ENCOUNTER — Encounter: Payer: Self-pay | Admitting: Internal Medicine

## 2021-11-30 MED ORDER — WEGOVY 0.5 MG/0.5ML ~~LOC~~ SOAJ: 0.5000 mg | SUBCUTANEOUS | 6 refills | Status: DC

## 2021-11-30 MED ORDER — WEGOVY 0.25 MG/0.5ML ~~LOC~~ SOAJ: 0.2500 mg | SUBCUTANEOUS | 0 refills | Status: DC

## 2021-11-30 MED ORDER — WEGOVY 0.25 MG/0.5ML ~~LOC~~ SOAJ: 0.2500 mg | SUBCUTANEOUS | 0 refills | Status: AC

## 2021-11-30 NOTE — Telephone Encounter (Signed)
Have printed can you fax to number and let her know we have done so and it is safe for her to take? ?

## 2021-12-14 ENCOUNTER — Encounter: Payer: Self-pay | Admitting: Internal Medicine

## 2021-12-25 DIAGNOSIS — H20022 Recurrent acute iridocyclitis, left eye: Secondary | ICD-10-CM | POA: Diagnosis not present

## 2022-01-07 ENCOUNTER — Encounter: Payer: Self-pay | Admitting: Internal Medicine

## 2022-01-09 MED ORDER — SEMAGLUTIDE-WEIGHT MANAGEMENT 0.25 MG/0.5ML ~~LOC~~ SOAJ: 0.2500 mg | SUBCUTANEOUS | 5 refills | Status: DC

## 2022-01-30 ENCOUNTER — Ambulatory Visit: Payer: Federal, State, Local not specified - PPO | Admitting: Internal Medicine

## 2022-01-30 DIAGNOSIS — H02831 Dermatochalasis of right upper eyelid: Secondary | ICD-10-CM | POA: Diagnosis not present

## 2022-01-30 DIAGNOSIS — H02834 Dermatochalasis of left upper eyelid: Secondary | ICD-10-CM | POA: Diagnosis not present

## 2022-01-30 DIAGNOSIS — Z961 Presence of intraocular lens: Secondary | ICD-10-CM | POA: Diagnosis not present

## 2022-01-30 DIAGNOSIS — H04123 Dry eye syndrome of bilateral lacrimal glands: Secondary | ICD-10-CM | POA: Diagnosis not present

## 2022-01-31 ENCOUNTER — Ambulatory Visit
Admission: RE | Admit: 2022-01-31 | Discharge: 2022-01-31 | Disposition: A | Discharge status: Home / Self Care | Payer: Federal, State, Local not specified - PPO | Source: Ambulatory Visit | Attending: Hematology | Admitting: Hematology

## 2022-01-31 DIAGNOSIS — Z780 Asymptomatic menopausal state: Secondary | ICD-10-CM | POA: Diagnosis not present

## 2022-01-31 DIAGNOSIS — M8589 Other specified disorders of bone density and structure, multiple sites: Secondary | ICD-10-CM | POA: Diagnosis not present

## 2022-01-31 DIAGNOSIS — C50411 Malignant neoplasm of upper-outer quadrant of right female breast: Secondary | ICD-10-CM

## 2022-01-31 DIAGNOSIS — Z1231 Encounter for screening mammogram for malignant neoplasm of breast: Secondary | ICD-10-CM | POA: Diagnosis not present

## 2022-02-02 ENCOUNTER — Other Ambulatory Visit: Payer: Self-pay | Admitting: Hematology

## 2022-02-02 DIAGNOSIS — R928 Other abnormal and inconclusive findings on diagnostic imaging of breast: Secondary | ICD-10-CM

## 2022-02-06 ENCOUNTER — Other Ambulatory Visit: Payer: Self-pay

## 2022-02-06 NOTE — Progress Notes (Signed)
Spoke with Judeen Hammans with the [facility] to get pt scheduled for [place] of Rt. Breast.  Appt scheduled for 02/16/2022 '@1400'$ .

## 2022-02-13 NOTE — Progress Notes (Signed)
[place]   [phone] [phone]   Clinic Follow up Note   Patient Care Team: Hoyt Koch, MD as PCP - General (Internal Medicine) Sueanne Margarita, MD as PCP - Cardiology (Cardiology) Mauro Kaufmann, RN as Oncology Nurse Navigator Rockwell Germany, RN as Oncology Nurse Navigator Hoxworth, Marland Kitchen, MD (Inactive) as Consulting Physician (General Surgery) Truitt Merle, MD as Consulting Physician (Hematology) Gery Pray, MD as Consulting Physician (Radiation Oncology) Alla Feeling, NP as Nurse Practitioner (Nurse Practitioner) 02/15/2022  CHIEF COMPLAINT: Follow up IDA and R breast cancer   SUMMARY OF ONCOLOGIC HISTORY: Oncology History Overview Note  Cancer Staging Malignant neoplasm of upper-outer quadrant of right breast in female, estrogen receptor positive ([place]) Staging form: Breast, AJCC 8th Edition - Clinical stage from 11/10/2018: Stage IA (cT1b, cN0, cM0, G2, ER+, PR+, HER2+) - Signed by Truitt Merle, MD on 11/18/2018     Malignant neoplasm of upper-outer quadrant of right breast in female, estrogen receptor positive ([place])  11/07/2018 Mammogram   Diagnostic 11/07/18 IMPRESSION: 1. 1 x 0.7 x 0.6 cm hypoechoic mass with distortion at the 9-9:30 position of the RIGHT breast 6 cm from the nipple with distortion in the UPPER-OUTER RIGHT breast, corresponding to the mammographic abnormality. One RIGHT axillary lymph node with borderline cortical thickness. Tissue sampling of the RIGHT breast mass and borderline RIGHT axillary lymph node recommended. ADDENDUM: Patient returned today for biopsy of a single borderline thickened lymph node in the RIGHT axilla. I could not reproduce a morphologically abnormal lymph node today, possibly resolved in the interval. Today, only normal appearing lymph nodes were identified in the RIGHT axilla. As such, the axillary ultrasound-guided biopsy was canceled.   11/10/2018 Cancer Staging   Staging  form: Breast, AJCC 8th Edition - Clinical stage from 11/10/2018: Stage IA (cT1b, cN0, cM0, G2, ER+, PR+, HER2+) - Signed by Truitt Merle, MD on 11/18/2018   11/11/2018 Initial Biopsy   Diagnosis 11/11/18 Breast, right, needle core biopsy, upper outer - 9:30 o'clock position - INVASIVE DUCTAL CARCINOMA, GRADE II/III. - SEE MICROSCOPIC DESCRIPTION.   11/11/2018 Receptors her2   Results: IMMUNOHISTOCHEMICAL AND MORPHOMETRIC ANALYSIS PERFORMED MANUALLY The tumor cells are POSITIVE for Her2 (3+). Estrogen Receptor: 95%, POSITIVE, STRONG STAINING INTENSITY Progesterone Receptor: 5%, POSITIVE, STRONG STAINING INTENSITY Proliferation Marker Ki67: 20%   11/17/2018 Initial Diagnosis   Malignant neoplasm of upper-outer quadrant of right breast in female, estrogen receptor positive ([place])   11/28/2018 Surgery   RIGHT BREAST LUMPECTOMY WITH RADIOACTIVE SEED AND RIGHT AXILLARY SENTINEL LYMPH NODE BIOPSY by Dr. Excell Seltzer  11/28/18    11/28/2018 Pathology Results   Diagnosis 11/28/18  1. Breast, lumpectomy, Right w/seed - INVASIVE DUCTAL CARCINOMA, 1.6 CM, [place]TINGHAM GRADE 2 OF 3. - MARGINS OF RESECTION ARE NOT INVOLVED (CLOSEST MARGIN: LESS THAN 1 MM, ANTERIOR). - DUCTAL CARCINOMA IN SITU. - BIOPSY SITE CHANGES. - SEE ONCOLOGY TABLE. 2. Lymph node, sentinel, biopsy, right Axillary - ONE LYMPH NODE, NEGATIVE FOR CARCINOMA (0/1). 3. Lymph node, sentinel, biopsy, right Axillary - ONE LYMPH NODE, NEGATIVE FOR CARCINOMA (0/1).   11/28/2018 Cancer Staging   Staging form: Breast, AJCC 8th Edition - Pathologic stage from 11/28/2018: Stage IA (pT1c, pN0, cM0, G2, ER+, PR+, HER2+) - Signed by Truitt Merle, MD on 12/11/2018   12/22/2018 Procedure   Baseline ECHO 12/22/18  IMPRESSIONS  1. The left ventricle has normal systolic function with an ejection fraction of 60-65%. The cavity size was normal. There is mild concentric left ventricular hypertrophy.  Left ventricular diastolic parameters were normal.  2. The right  ventricle has normal systolic function. The cavity was normal. There is no increase in right ventricular wall thickness.  3. Left atrial size was moderately dilated.  4. The aortic valve is tricuspid. Aortic valve regurgitation was not assessed by color flow Doppler.    01/02/2019 - 02/12/2020 Chemotherapy   Adjuvant chemo weekly Taxol with Herceptin starting 01/02/19. Will d/c after 02/13/19 and will switch to Kadcyla q3weeks starting 02/20/19 due to worsening neuropathy. Due to worsening Muscle cramps Kadcyla stopped 04/03/19 and switched to Herceptin q3weeks on 04/23/19. Completed on 02/12/20   04/09/2019 - 05/28/2019 Radiation Therapy   RT with Dr. Sondra Come 04/09/19-05/28/19   06/29/2019 -  Anti-estrogen oral therapy   Exemestane 11m daily starting 06/29/19   04/28/2020 Survivorship   SCP delivered by LCira Rue NP     CURRENT THERAPY:  -IV iron as needed  -Exemestane 250mdaily starting 06/29/19  INTERVAL HISTORY: Summer [name] for follow up as scheduled. Last seen by Dr. FeBurr Medico2/8/22.  She recently had cataract surgery and is recovering from that.  Screening mammogram 01/31/22 showed breast density cat b and a possible asymmetry/distortion within the slightly upper right breast. She is scheduled for diagnostic R mammo/us on 6/9.  This has stressed her out a bit.  Otherwise doing well on exemestane.  Energy and appetite are stable, she is on [facility] weight [facility].  She is unhappy with asymmetry of her abdomen after surgery for diverticulitis.  She also notes that she hides her breast due to asymmetry from breast cancer treatment.  She is interested in discussing her concerns with the plastic surgeon.  She takes vitamin D and has started walking again.  She is up-to-date on oral hygiene checks.  Otherwise denies bone or joint pain, hot flashes, change in bowel habits, bloody stools, abdominal pain or bloating, or any other new specific complaints.   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   Complication of anesthesia    Pt states having some "difficulty waking up"   Constipation 07/01/2018   Costochondritis 05/04/2014   Diverticulitis of colon 06/13/2007   S/p segmental colectomy for perforated diverticulitis    Diverticulosis    Facial pain 10/06/2015   Fibromyalgia    Foot sprain, left, initial encounter 08/31/2016   Glossitis 02/02/2016   Headache 04/14/61/1308 Helicobacter pylori gastritis 10/2020   HEMATURIA UNSPECIFIED 06/25/2008   Qualifier: Diagnosis of  By: ToSherren MochaD, JeDellis Filbert    History of kidney stones    History of shingles 2012--  no residual pain   Hx of adenomatous colonic polyps 10/2020   Itching 10/13/2018   Left eye pain 07/01/2018   Left ureteral calculus    LOC [facility]C [facility] 06/27[facility]2009   Qualifier: Diagnosis of  By: ToSherren MochaD, JeDellis Filbert    Malignant neoplasm of upper-outer quadrant of right breast in female, estrogen receptor positive [place]05[place]   MCI (mild cognitive impairment)    Memory  loss 12/21/2016   Morbid obesity ([place]) 11/09/2010   Pain of right side of body 08/31/2016   Personal history of chemotherapy    Personal history of radiation therapy    Pruritus 10/26/2016   Renal disorder    hx of   Right sided temporal headache 12/21/2016   Tremor 12/21/2016   Trigeminal neuralgia RIGHT   Trigeminal neuralgia of right side of face 11/30/2010   Trochanteric bursitis, right hip 10/26/2016   Urgency of urination    Weight loss 07/22/2017   Zoster without complications 6/64/4034    SURGICAL HISTORY: Past Surgical History:  Procedure  Laterality Date   BREAST BIOPSY Right 11/11/2018   BREAST LUMPECTOMY Right 11/28/2018   BREAST LUMPECTOMY WITH RADIOACTIVE SEED AND SENTINEL LYMPH NODE BIOPSY Right 11/28/2018   Procedure: RIGHT BREAST LUMPECTOMY WITH RADIOACTIVE SEED AND RIGHT AXILLARY SENTINEL LYMPH NODE BIOPSY;  Surgeon: Excell Seltzer, MD;  Location: [place];  Service: General;  Laterality: Right;   CEREBRAL MICROVASCULAR DECOMPRESSION  07-05-2006   RIGHT TRIGEMINAL NERVE   COLONOSCOPY  2013   CG-MAC-moviprep (exc)-normal -mild tics   CYSTOSCOPY/RETROGRADE/URETEROSCOPY/STONE EXTRACTION WITH BASKET  07/04/2012   Procedure: CYSTOSCOPY/RETROGRADE/URETEROSCOPY/STONE EXTRACTION WITH BASKET;  Surgeon: Claybon Jabs, MD;  Location: [place];  Service: Urology;  Laterality: Left;   EXPLORATORY LAPAROTOMY/ RESECTION MID TO DISTAL SIGMOID AND PROXIMAL RECTUM/ END PROXIMAL SIGMOID COLOSTOMY  07-17-2006   PERFORATED DIVERTICULITIS WITH PERITONITIS   EXTRACORPOREAL SHOCK WAVE LITHOTRIPSY Left 01/19/2021   Procedure: LEFT EXTRACORPOREAL SHOCK WAVE LITHOTRIPSY (ESWL);  Surgeon: Bjorn Loser, MD;  Location: [facility];  Service: Urology;  Laterality: Left;   gamma knife  05/17/2016   for trigeminal neuralgia, [facility], Dr Salomon Fick   gamma knife  2019   IR CV LINE INJECTION  10/27/2019   PORTACATH PLACEMENT Left 11/28/2018   Procedure: INSERTION PORT-A-CATH WITH ULTRASOUND;  Surgeon: Excell Seltzer, MD;  Location: [place];  Service: General;  Laterality: Left;   portacath removal     RESECTION COLOSTOMY/ CLOSURE COLOSTOMY WITH COLOPROCTOSTOMY  02-20-2007   TONSILLECTOMY  AS CHILD   TOTAL KNEE ARTHROPLASTY Left 06/20/2020   Procedure: LEFT TOTAL KNEE ARTHROPLASTY;  Surgeon: Leandrew Koyanagi, MD;  Location: [place];  Service: Orthopedics;  Laterality: Left;   URETEROSCOPY  07/04/2012   Procedure: URETEROSCOPY;  Surgeon: Claybon Jabs, MD;  Location: [facility];  Service: Urology;   Laterality: Left;   VAGINAL HYSTERECTOMY  1998   Partial   WISDOM TOOTH EXTRACTION      I have reviewed the social history and family history with the patient and they are unchanged from previous note.  ALLERGIES:  is allergic to keflex [cephalexin] and clindamycin hcl.  MEDICATIONS:  Current Outpatient Medications  Medication Sig Dispense Refill   anastrozole (ARIMIDEX) 1 MG tablet Take 1 tablet (1 mg total) by mouth daily. 90 tablet 3   baclofen (LIORESAL) 10 MG tablet Take 1 tablet (10 mg total) by mouth 2 (two) times daily. 60 each 5   celecoxib (CELEBREX) 200 MG capsule Take 1 capsule (200 mg total) by mouth 2 (two) times daily. 30 capsule 1   diclofenac Sodium (VOLTAREN) 1 % GEL Apply 2 g topically 4 (four) times daily. 150 g 0   DULoxetine (CYMBALTA) 60 MG capsule TAKE 1 CAPSULE BY MOUTH EVERY DAY 90 capsule 1   gabapentin (NEURONTIN) 600 MG tablet TAKE 2 TABLETS BY MOUTH 3 TIMES A DAY. Please call [phone] to schedule an appt. 540 tablet  4   nystatin (MYCOSTATIN) 100000 UNIT/ML suspension Take 5 mLs (500,000 Units total) by mouth in the morning, at noon, and at bedtime. 120 mL 0   ondansetron (ZOFRAN) 4 MG tablet Take 4 mg by mouth every 6 (six) hours as needed.     pravastatin (PRAVACHOL) 20 MG tablet Take 1 tablet (20 mg total) by mouth daily. 90 tablet 3   Semaglutide-Weight Management (WEGOVY) 0.5 MG/0.5ML SOAJ Inject 0.5 mg into the skin once a week. 2 mL 6   Semaglutide-Weight Management 0.25 MG/0.5ML SOAJ Inject 0.25 mg into the skin once a week. 2 mL 5   No current facility-administered medications for this visit.    PHYSICAL EXAMINATION: ECOG PERFORMANCE STATUS: 0 - Asymptomatic  Vitals:   02/15/22 0854  BP: 139/69  Pulse: 73  Resp: 16  Temp: (!) 97.5 F (36.4 C)  SpO2: 96%   Filed Weights   02/15/22 0854  Weight: 195 lb 8 oz (88.7 kg)    GENERAL:alert, no distress and comfortable SKIN: No rash EYES: sclera clear NECK: Without mass LYMPH:  no palpable  cervical or supraclavicular lymphadenopathy LUNGS: clear with normal breathing effort HEART: regular rate & rhythm,  no lower extremity edema ABDOMEN:abdomen soft, non-tender and normal bowel sounds NEURO: alert & oriented x 3 with fluent speech, no focal motor deficits Breast exam: Pendulous breasts are asymmetric left > right without nipple discharge or inversion.  S/p right lumpectomy, incisions completely healed with minimal scar tissue.  Trace hyperpigmentation to the right breast.  Slightly dense/full tissue noted in the right upper outer quadrant, no palpable mass or nodularity in either breast or axilla that I could appreciate.  LABORATORY DATA:  I have reviewed the data as listed    Latest Ref Rng & Units 02/15/2022    8:22 AM 08/17/2021    2:13 PM 02/15/2021   12:24 PM  CBC  WBC 4.0 - 10.5 K/uL 5.1  5.4  5.0   Hemoglobin 12.0 - 15.0 g/dL 13.8  13.3  13.4   Hematocrit 36.0 - 46.0 % 43.2  40.6  41.9   Platelets 150 - 400 K/uL 206  243  234         Latest Ref Rng & Units 02/15/2022    8:22 AM 08/17/2021    2:13 PM 02/15/2021    1:58 PM  CMP  Glucose 70 - 99 mg/dL 89  89    BUN 8 - 23 mg/dL 17  11    Creatinine 0.44 - 1.00 mg/dL 0.89  0.79    Sodium 135 - 145 mmol/L 142  141    Potassium 3.5 - 5.1 mmol/L 4.1  3.8    Chloride 98 - 111 mmol/L 105  104    CO2 22 - 32 mmol/L 32  29    Calcium 8.9 - 10.3 mg/dL 9.8  8.9  9.5   Total Protein 6.5 - 8.1 g/dL 7.0  6.9    Total Bilirubin 0.3 - 1.2 mg/dL 0.6  1.3    Alkaline Phos 38 - 126 U/L 97  119    AST 15 - 41 U/L 15  17    ALT 0 - 44 U/L 7  8        RADIOGRAPHIC STUDIES: I have personally reviewed the radiological images as listed and agreed with the findings in the report. No results found.   ASSESSMENT & PLAN: Summer [name] is a 71 y.o. female with    1. Malignant neoplasm of upper-outer quadrant  of right breast, Stage IA, pT1b,N0,M0, ER/PR+, HER2+, Grade II  -Diagnosed in 11/2018. S/p right breast lumpectomy and  SLNB  and adjuvant radiation. She was treated with adjuvant chemo (poorly tolerated Taxol and Herceptin, d/c after 6 weeks), Kadcyla for 1 month, then Herceptin only until 12/2019.  -She began antiestrogen therapy with Exemestane in 06/2019.  -Ms. Ytuarte is clinically doing well.  Tolerating exemestane.  Physical exam shows possible soft tissue density/fullness in the right upper outer quadrant, no discrete mass and otherwise benign.  Labs are unremarkable. -She has breast asymmetry from previous cancer treatment and abdominal asymmetry from diverticulitis surgery, she is not happy with her appearance and is interested in a referral to plastic surgery which I placed today. -We reviewed the imaging and report from her mammogram on 01/31/2022 which shows breast density category B, stable postsurgical changes in the lower central right breast, and a possible asymmetry/distortion within the upper right breast.  She is scheduled for diagnostic right mammo and possible ultrasound 02/16/2022.  We will follow-up on the results. -If work-up is negative, next surveillance visit in 6 months -Continue exemestane   2. Anemia, iron deficiency  -Last colonoscopy was in 04/2012 which was unremarkable except diverticulosis. She is s/p partial colectomy -Her workup in early 2020 showed iron 17, ferritin 4, normal retic ct, folate Vitamin B12 and SPEP panels. -Her 10/12/20 Colonoscopy/Upper Endoscopy with Dr Carlean Purl showed Gastritis, H. Pylorie, Tubular adenomas, but no other cause or source of GI bleeding.  -She received iv iron 11/26/18 and 12/12/18. Anemia has resolved since 08/2020.  -Iron panel is normal, ferritin is pending   3. Comorbidities: Osteoarthritis and arthralgia -02/03/19 brain and neck MRI were stable.  -Has not been completely controlled but managing with occasionally worsened flares.  -Denies bone/joint pain today   4. Peripheral Neuropathy, G1-2; Trigeminal neuralgia -Secondary to chemo Taxol started  after cycle 3.  -On Robaxin, B12. High dose Gabapentin dose not help.  -she is currently on gabapentin 1200 mg TID and cymbalta 60 mg daily, managed by Dr. Krista Blue. Overall controlled, but she still has some symptoms. -Dr. Burr Medico previously discussed option of neuropathy clinical trail. She declined for now.    5. Osteopenia  -Her baseline DEXA from 07/2019 shows osteopenia with lowest T-score -1.1 at left hip.  -Will continue to monitor on AI as this can weaken her bones. Repeat every 2 years.  -I recommend she start OTC calcium and Vitamin D.  -DEXA 01/31/2022 showed lowest T score -1.8 at the forearm radius; BMD had also decreased at the left femur neck from -1.1 to -1.7, overall low FRAX score. -We discussed the possibility of adding medication for osteopenia including oral, injectable, and intravenous options.  She has normal kidney function and is up-to-date on dental hygiene, she would be a good candidate for Zometa -We will hold for now, she understands we will recommend medication if BMD continues to decrease -For now she will optimize calcium, vitamin D, and weightbearing exercise  Plan: -Labs, mammogram, and DEXA reviewed -Proceed with diagnostic right mammo possible ultrasound scheduled 6/9, we will follow-up on the results -Maximize calcium, vitamin D, and weightbearing exercise.  We discussed medication for bone density, will hold for now.  Possibly repeat DEXA in 1 year.  She would be an acceptable candidate for Zometa as of now if that becomes appropriate -Continue surveillance and exemestane -Referral to plastic surgeon -Follow-up in 6 months if above work-up is negative    Orders Placed This Encounter  Procedures  Ambulatory referral to Plastic Surgery    Referral Priority:   Routine    Referral Type:   Surgical    Referral Reason:   Specialty Services Required    Requested Specialty:   Plastic Surgery    Number of Visits Requested:   1   All questions were answered. The  patient knows to call the clinic with any problems, questions or concerns. No barriers to learning was detected. I spent 20 minutes counseling the patient face to face. The total time spent in the appointment was 30 minutes and more than 50% was on counseling and review of test results.      Alla Feeling, NP 02/15/22

## 2022-02-14 DIAGNOSIS — Z961 Presence of intraocular lens: Secondary | ICD-10-CM | POA: Diagnosis not present

## 2022-02-14 DIAGNOSIS — H18413 Arcus senilis, bilateral: Secondary | ICD-10-CM | POA: Diagnosis not present

## 2022-02-14 DIAGNOSIS — H26493 Other secondary cataract, bilateral: Secondary | ICD-10-CM | POA: Diagnosis not present

## 2022-02-14 DIAGNOSIS — H04123 Dry eye syndrome of bilateral lacrimal glands: Secondary | ICD-10-CM | POA: Diagnosis not present

## 2022-02-15 ENCOUNTER — Inpatient Hospital Stay: Payer: Federal, State, Local not specified - PPO

## 2022-02-15 ENCOUNTER — Telehealth: Payer: Self-pay | Admitting: Hematology

## 2022-02-15 ENCOUNTER — Other Ambulatory Visit: Payer: Self-pay

## 2022-02-15 ENCOUNTER — Inpatient Hospital Stay: Payer: Federal, State, Local not specified - PPO | Attending: Nurse Practitioner | Admitting: Nurse Practitioner

## 2022-02-15 ENCOUNTER — Encounter: Payer: Self-pay | Admitting: Nurse Practitioner

## 2022-02-15 VITALS — BP 139/69 | HR 73 | Temp 97.5°F | Resp 16 | Ht 66.0 in | Wt 195.5 lb

## 2022-02-15 DIAGNOSIS — M8580 Other specified disorders of bone density and structure, unspecified site: Secondary | ICD-10-CM

## 2022-02-15 DIAGNOSIS — D509 Iron deficiency anemia, unspecified: Secondary | ICD-10-CM | POA: Diagnosis not present

## 2022-02-15 DIAGNOSIS — M2550 Pain in unspecified joint: Secondary | ICD-10-CM | POA: Insufficient documentation

## 2022-02-15 DIAGNOSIS — Z170 Estrogen receptor positive status [ER+]: Secondary | ICD-10-CM | POA: Diagnosis not present

## 2022-02-15 DIAGNOSIS — Z853 Personal history of malignant neoplasm of breast: Secondary | ICD-10-CM | POA: Diagnosis not present

## 2022-02-15 DIAGNOSIS — G629 Polyneuropathy, unspecified: Secondary | ICD-10-CM | POA: Insufficient documentation

## 2022-02-15 DIAGNOSIS — C50411 Malignant neoplasm of upper-outer quadrant of right female breast: Secondary | ICD-10-CM

## 2022-02-15 DIAGNOSIS — M1990 Unspecified osteoarthritis, unspecified site: Secondary | ICD-10-CM | POA: Diagnosis not present

## 2022-02-15 LAB — CMP (CANCER CENTER ONLY)
ALT: 7 U/L (ref 0–44)
AST: 15 U/L (ref 15–41)
Albumin: 4.2 g/dL (ref 3.5–5.0)
Alkaline Phosphatase: 97 U/L (ref 38–126)
Anion gap: 5 (ref 5–15)
BUN: 17 mg/dL (ref 8–23)
CO2: 32 mmol/L (ref 22–32)
Calcium: 9.8 mg/dL (ref 8.9–10.3)
Chloride: 105 mmol/L (ref 98–111)
Creatinine: 0.89 mg/dL (ref 0.44–1.00)
GFR, Estimated: >60 mL/min (ref >60)
Glucose, Bld: 89 mg/dL (ref 70–99)
Potassium: 4.1 mmol/L (ref 3.5–5.1)
Sodium: 142 mmol/L (ref 135–145)
Total Bilirubin: 0.6 mg/dL (ref 0.3–1.2)
Total Protein: 7 g/dL (ref 6.5–8.1)

## 2022-02-15 LAB — CBC WITH DIFFERENTIAL (CANCER CENTER ONLY)
Abs Immature Granulocytes: 0.01 10*3/uL (ref 0.00–0.07)
Basophils Absolute: 0 10*3/uL (ref 0.0–0.1)
Basophils Relative: 1 %
Eosinophils Absolute: 0.2 10*3/uL (ref 0.0–0.5)
Eosinophils Relative: 3 %
HCT: 43.2 % (ref 36.0–46.0)
Hemoglobin: 13.8 g/dL (ref 12.0–15.0)
Immature Granulocytes: 0 %
Lymphocytes Relative: 31 %
Lymphs Abs: 1.6 10*3/uL (ref 0.7–4.0)
MCH: 29.2 pg (ref 26.0–34.0)
MCHC: 31.9 g/dL (ref 30.0–36.0)
MCV: 91.5 fL (ref 80.0–100.0)
Monocytes Absolute: 0.5 10*3/uL (ref 0.1–1.0)
Monocytes Relative: 9 %
Neutro Abs: 2.9 10*3/uL (ref 1.7–7.7)
Neutrophils Relative %: 56 %
Platelet Count: 206 10*3/uL (ref 150–400)
RBC: 4.72 MIL/uL (ref 3.87–5.11)
RDW: 13.2 % (ref 11.5–15.5)
WBC Count: 5.1 10*3/uL (ref 4.0–10.5)
nRBC: 0 % (ref 0.0–0.2)

## 2022-02-15 LAB — IRON AND IRON BINDING CAPACITY (CC-WL,HP ONLY)
Iron: 107 ug/dL (ref 28–170)
Saturation Ratios: 32 % — ABNORMAL HIGH (ref 10.4–31.8)
TIBC: 339 ug/dL (ref 250–450)
UIBC: 232 ug/dL (ref 148–442)

## 2022-02-15 LAB — FERRITIN: Ferritin: 43 ng/mL (ref 11–307)

## 2022-02-15 LAB — VITAMIN D 25 HYDROXY (VIT D DEFICIENCY, FRACTURES): Vit D, 25-Hydroxy: 57.9 ng/mL (ref 30–100)

## 2022-02-15 MED ORDER — ANASTROZOLE 1 MG PO TABS: 1.0000 mg | ORAL_TABLET | Freq: Every day | ORAL | 3 refills | Status: DC

## 2022-02-15 NOTE — Telephone Encounter (Signed)
Scheduled follow-up appointment per 6/8 los. Patient is aware.

## 2022-02-16 ENCOUNTER — Encounter: Payer: Self-pay | Admitting: Internal Medicine

## 2022-02-16 ENCOUNTER — Ambulatory Visit
Admission: RE | Admit: 2022-02-16 | Discharge: 2022-02-16 | Disposition: A | Discharge status: Home / Self Care | Payer: Federal, State, Local not specified - PPO | Source: Ambulatory Visit | Attending: Hematology | Admitting: Hematology

## 2022-02-16 DIAGNOSIS — N650 Deformity of reconstructed breast: Secondary | ICD-10-CM | POA: Diagnosis not present

## 2022-02-16 DIAGNOSIS — R928 Other abnormal and inconclusive findings on diagnostic imaging of breast: Secondary | ICD-10-CM | POA: Diagnosis not present

## 2022-02-16 DIAGNOSIS — Z853 Personal history of malignant neoplasm of breast: Secondary | ICD-10-CM | POA: Diagnosis not present

## 2022-02-20 ENCOUNTER — Telehealth: Payer: Self-pay | Admitting: Neurology

## 2022-02-20 NOTE — Telephone Encounter (Signed)
LVM and sent mychart msg informing pt of r/s needed for 8/17 appt- Judson Roch out.

## 2022-02-22 ENCOUNTER — Ambulatory Visit: Payer: Federal, State, Local not specified - PPO | Admitting: Plastic Surgery

## 2022-02-22 VITALS — BP 134/74 | HR 80 | Ht 66.0 in | Wt 195.4 lb

## 2022-02-22 DIAGNOSIS — M545 Low back pain, unspecified: Secondary | ICD-10-CM

## 2022-02-22 DIAGNOSIS — Z170 Estrogen receptor positive status [ER+]: Secondary | ICD-10-CM

## 2022-02-22 DIAGNOSIS — M549 Dorsalgia, unspecified: Secondary | ICD-10-CM | POA: Diagnosis not present

## 2022-02-22 DIAGNOSIS — N62 Hypertrophy of breast: Secondary | ICD-10-CM

## 2022-02-22 DIAGNOSIS — M793 Panniculitis, unspecified: Secondary | ICD-10-CM

## 2022-02-22 DIAGNOSIS — Z6831 Body mass index (BMI) 31.0-31.9, adult: Secondary | ICD-10-CM

## 2022-02-22 DIAGNOSIS — Z853 Personal history of malignant neoplasm of breast: Secondary | ICD-10-CM

## 2022-02-22 DIAGNOSIS — M4004 Postural kyphosis, thoracic region: Secondary | ICD-10-CM

## 2022-02-22 DIAGNOSIS — M542 Cervicalgia: Secondary | ICD-10-CM

## 2022-02-22 DIAGNOSIS — Z803 Family history of malignant neoplasm of breast: Secondary | ICD-10-CM

## 2022-02-22 DIAGNOSIS — R21 Rash and other nonspecific skin eruption: Secondary | ICD-10-CM

## 2022-02-22 DIAGNOSIS — M546 Pain in thoracic spine: Secondary | ICD-10-CM

## 2022-02-22 NOTE — Progress Notes (Signed)
Referring Provider Hoyt Koch, MD [address] [place],  [place] [address]   CC:  Chief Complaint  Patient presents with   Consult      Summer [name] is an 71 y.o. female.  HPI: Patient presents to discuss breast reduction.  She has had years of back pain, neck pain and shoulder grooving related to her large breast.  She tried over-the-counter medications, warm packs, cold packs and supportive bras with little relief.  She would like to be a C cup.  She has a history of breast cancer treatment on the right side with lumpectomy and radiation.  She is currently in remission on antiestrogen therapy.  Regarding the abdomen she is bothered by overhanging skin.  She has had a partial colectomy from the sound of it for diverticulitis and the scar has contributed to a contour irregularity and infraumbilical pannus.  She gets irritation in the fold beneath this that has not been controlled with over-the-counter treatments.  She is interested in improvement of the contour in this area.  She does not smoke and is not diabetic.  Allergies  Allergen Reactions   Keflex [Cephalexin] Anaphylaxis and Other (See Comments)    Caused violent dizziness and syncope   Clindamycin Hcl Itching    Outpatient Encounter Medications as of 02/22/2022  Medication Sig   anastrozole (ARIMIDEX) 1 MG tablet Take 1 tablet (1 mg total) by mouth daily.   baclofen (LIORESAL) 10 MG tablet Take 1 tablet (10 mg total) by mouth 2 (two) times daily.   celecoxib (CELEBREX) 200 MG capsule Take 1 capsule (200 mg total) by mouth 2 (two) times daily.   diclofenac Sodium (VOLTAREN) 1 % GEL Apply 2 g topically 4 (four) times daily.   DULoxetine (CYMBALTA) 60 MG capsule TAKE 1 CAPSULE BY MOUTH EVERY DAY   gabapentin (NEURONTIN) 600 MG tablet TAKE 2 TABLETS BY MOUTH 3 TIMES A DAY. Please call [phone] to schedule an appt.   nystatin (MYCOSTATIN) 100000 UNIT/ML suspension Take 5 mLs (500,000 Units total) by mouth  in the morning, at noon, and at bedtime.   ondansetron (ZOFRAN) 4 MG tablet Take 4 mg by mouth every 6 (six) hours as needed.   pravastatin (PRAVACHOL) 20 MG tablet Take 1 tablet (20 mg total) by mouth daily.   Semaglutide-Weight Management (WEGOVY) 0.5 MG/0.5ML SOAJ Inject 0.5 mg into the skin once a week.   Semaglutide-Weight Management 0.25 MG/0.5ML SOAJ Inject 0.25 mg into the skin once a week.   No facility-administered encounter medications on file as of 02/22/2022.     Past Medical History:  Diagnosis Date   Allergic rhinitis 08/26/2008   Qualifier: Diagnosis of  By: Sherren Mocha MD, Dellis Filbert A    Anemia, iron deficiency 11/21/2018   Arthritis shoulders and back   BACK PAIN, CHRONIC 06/13/2007   Qualifier: Diagnosis of  By: Sherren Mocha MD, Jory Ee    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   Cataract    bilateral - no sx as of 09/23/2020)   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   Complication of anesthesia    Pt states having some "difficulty waking up"   Constipation 07/01/2018   Costochondritis 05/04/2014   Diverticulitis of colon 06/13/2007   S/p segmental colectomy for perforated diverticulitis    Diverticulosis    Facial pain 10/06/2015   Fibromyalgia    Foot sprain, left, initial encounter 08/31/2016   Glossitis 02/02/2016   Headache 2/40/9735   Helicobacter pylori gastritis 10/2020  HEMATURIA UNSPECIFIED 06/25/2008   Qualifier: Diagnosis of  By: Sherren Mocha MD, Jory Ee    History of kidney stones    History of shingles 2012--  no residual pain   Hx of adenomatous colonic polyps 10/2020   Itching 10/13/2018   Left eye pain 07/01/2018   Left ureteral calculus    LOC OSTEOARTHROS NOT SPEC PRIM/SEC OTH [facility] SITE 06/27/2009   Qualifier: Diagnosis of  By: Sherren Mocha MD, Dellis Filbert A    Malignant neoplasm of upper-outer quadrant of right breast in female, estrogen receptor positive ([place]) 11/17/2018   MCI (mild cognitive impairment)    Memory loss 12/21/2016   Morbid obesity ([place]) 11/09/2010   Pain  of right side of body 08/31/2016   Personal history of chemotherapy    Personal history of radiation therapy    Pruritus 10/26/2016   Renal disorder    hx of   Right sided temporal headache 12/21/2016   Tremor 12/21/2016   Trigeminal neuralgia RIGHT   Trigeminal neuralgia of right side of face 11/30/2010   Trochanteric bursitis, right hip 10/26/2016   Urgency of urination    Weight loss 07/22/2017   Zoster without complications 7/62/8315    Past Surgical History:  Procedure Laterality Date   BREAST BIOPSY Right 11/11/2018   BREAST LUMPECTOMY Right 11/28/2018   BREAST LUMPECTOMY WITH RADIOACTIVE SEED AND SENTINEL LYMPH NODE BIOPSY Right 11/28/2018   Procedure: RIGHT BREAST LUMPECTOMY WITH RADIOACTIVE SEED AND RIGHT AXILLARY SENTINEL LYMPH NODE BIOPSY;  Surgeon: Excell Seltzer, MD;  Location: [place];  Service: General;  Laterality: Right;   CEREBRAL MICROVASCULAR DECOMPRESSION  07-05-2006   RIGHT TRIGEMINAL NERVE   COLONOSCOPY  2013   CG-MAC-moviprep (exc)-normal -mild tics   CYSTOSCOPY/RETROGRADE/URETEROSCOPY/STONE EXTRACTION WITH BASKET  07/04/2012   Procedure: CYSTOSCOPY/RETROGRADE/URETEROSCOPY/STONE EXTRACTION WITH BASKET;  Surgeon: Claybon Jabs, MD;  Location: [place];  Service: Urology;  Laterality: Left;   EXPLORATORY LAPAROTOMY/ RESECTION MID TO DISTAL SIGMOID AND PROXIMAL RECTUM/ END PROXIMAL SIGMOID COLOSTOMY  07-17-2006   PERFORATED DIVERTICULITIS WITH PERITONITIS   EXTRACORPOREAL SHOCK WAVE LITHOTRIPSY Left 01/19/2021   Procedure: LEFT EXTRACORPOREAL SHOCK WAVE LITHOTRIPSY (ESWL);  Surgeon: Bjorn Loser, MD;  Location: [facility];  Service: Urology;  Laterality: Left;   gamma knife  05/17/2016   for trigeminal neuralgia, [facility], Dr Salomon Fick   gamma knife  2019   IR CV LINE INJECTION  10/27/2019   PORTACATH PLACEMENT Left 11/28/2018   Procedure: INSERTION PORT-A-CATH WITH ULTRASOUND;  Surgeon: Excell Seltzer, MD;  Location: [place];  Service: General;  Laterality: Left;   portacath removal     RESECTION COLOSTOMY/ CLOSURE COLOSTOMY WITH COLOPROCTOSTOMY  02-20-2007   TONSILLECTOMY  AS CHILD   TOTAL KNEE ARTHROPLASTY Left 06/20/2020   Procedure: LEFT TOTAL KNEE ARTHROPLASTY;  Surgeon: Leandrew Koyanagi, MD;  Location: [place];  Service: Orthopedics;  Laterality: Left;   URETEROSCOPY  07/04/2012   Procedure: URETEROSCOPY;  Surgeon: Claybon Jabs, MD;  Location: [facility];  Service: Urology;  Laterality: Left;   VAGINAL HYSTERECTOMY  1998   Partial   WISDOM TOOTH EXTRACTION      Family History  Problem Relation Age of Onset   Heart disease Maternal Grandmother    Diabetes Mother    Breast cancer Maternal Aunt    Lupus Cousin    Colon cancer Neg Hx    Esophageal cancer Neg Hx    Rectal cancer Neg Hx    Stomach cancer Neg Hx  Colon polyps Neg Hx     Social History   Social History Narrative   Not on file     Review of Systems General: Denies fevers, chills, weight loss CV: Denies chest pain, shortness of breath, palpitations  Physical Exam    02/22/2022   12:52 PM 02/15/2022    8:54 AM 10/17/2021    3:44 PM  Vitals with BMI  Height _0  _1  _2   Weight 195 lbs 6 oz 195 lbs 8 oz 201 lbs  BMI 31.55 99.77 41.42  Systolic 395 320 233  Diastolic 74 69 76  Pulse 80 73 72    General:  No acute distress,  Alert and oriented, Non-Toxic, Normal speech and affect Breast: She has grade 3 ptosis.  Sternal notch to nipple is 39 cm on the right and 43 cm on the left.  Nipple to fold is 18 cm bilaterally.  She has a well-healed lumpectomy scar in the right lateral breast.  Mild radiation changes on the right side. Abdomen: Abdomen soft nontender.  No obvious hernias.  Midline scar with tethering in the infra pannus crease.  She does have a fold with signs of chronic skin irritation.  Assessment/Plan The patient has bilateral symptomatic macromastia.  She is a good candidate for a breast  reduction.  She is interested in pursuing surgical treatment.  She has tried supportive garments and fitted bras with no relief.  The details of breast reduction surgery were discussed.  I explained the procedure in detail along the with the expected scars.  The risks were discussed in detail and include bleeding, infection, damage to surrounding structures, need for additional procedures, nipple loss, change in nipple sensation, persistent pain, contour irregularities and asymmetries.  I explained that breast feeding is often not possible after breast reduction surgery.  We discussed the expected postoperative course with an overall recovery period of about 1 month.  She demonstrated full understanding of all risks.  We discussed her personal risk factors that include history of radiation on the right side.  The patient is interested in pursuing surgical treatment.  I anticipate approximately 675g of tissue removed from each side.  Regarding the abdomen I do think she is a good candidate for infraumbilical panniculectomy.  We discussed the details of that surgery.  We discussed risks include bleeding, infection, damage to surrounding structures need for additional procedures.  We discussed the need for drains postoperatively.  I discussed the location and orientation of the scars.  I do think it safe to combine this with the breast procedure if that is what she chooses to do.  All of her questions were answered.   Cindra Presume 02/22/2022, 1:17 PM

## 2022-02-27 NOTE — Telephone Encounter (Signed)
Pt is currently on her 2nd month with the .25 prescription

## 2022-03-07 ENCOUNTER — Other Ambulatory Visit: Payer: Self-pay

## 2022-03-08 ENCOUNTER — Telehealth: Payer: Self-pay | Admitting: Plastic Surgery

## 2022-03-08 ENCOUNTER — Telehealth: Payer: Self-pay | Admitting: Internal Medicine

## 2022-03-08 MED ORDER — SEMAGLUTIDE-WEIGHT MANAGEMENT 0.25 MG/0.5ML ~~LOC~~ SOAJ: 0.2500 mg | SUBCUTANEOUS | 5 refills | Status: DC

## 2022-03-08 MED ORDER — WEGOVY 0.5 MG/0.5ML ~~LOC~~ SOAJ
0.5000 mg | SUBCUTANEOUS | 6 refills | Status: DC
Start: 2022-03-08 — End: 2022-04-15

## 2022-03-08 MED ORDER — WEGOVY 0.5 MG/0.5ML ~~LOC~~ SOAJ: 0.5000 mg | SUBCUTANEOUS | 6 refills | Status: DC

## 2022-03-08 NOTE — Telephone Encounter (Signed)
Caller & Relationship to patient: Summer [name]  Call back number: [phone]  Date of last office visit: 09/25/21  Date of next office visit:   Medication(s) to be refilled:  Semaglutide-Weight Management (WEGOVY) 0.5 MG/0.5ML [facility]      [facility]   [id number]:  [phone] Fax:  [phone]

## 2022-03-08 NOTE — Addendum Note (Signed)
Addended by: Thomes Cake on: 03/08/2022 02:10 PM   Modules accepted: Orders

## 2022-03-08 NOTE — Telephone Encounter (Signed)
Initiated surgery authorization through [employer] portal. Pending case # [id number]

## 2022-03-08 NOTE — Telephone Encounter (Signed)
Refill has been sent to the pt's pharmacy  

## 2022-03-22 ENCOUNTER — Encounter: Payer: Self-pay | Admitting: Hematology

## 2022-03-22 ENCOUNTER — Telehealth: Payer: Self-pay | Admitting: Plastic Surgery

## 2022-03-22 NOTE — Telephone Encounter (Signed)
Called patient to advise that her insurance does not require a prior authorization for outpatient services and that we can request a pre determination. I explained to the patient that  pre determination would mean that someone from her insurance has to review the medical notes for medical necessity before the surgery is performed. Patient understands this. Advised patient that provider she previously saw is no longer at our office and that we can transition her care to one of the other providers and she can do another consult with them and we can submit the pre determination under the new provider. Patient is okay with this. Consult scheduled with Dr. Erin Hearing.

## 2022-03-23 DIAGNOSIS — H04122 Dry eye syndrome of left lacrimal gland: Secondary | ICD-10-CM | POA: Diagnosis not present

## 2022-03-23 DIAGNOSIS — H26492 Other secondary cataract, left eye: Secondary | ICD-10-CM | POA: Diagnosis not present

## 2022-03-23 DIAGNOSIS — Z961 Presence of intraocular lens: Secondary | ICD-10-CM | POA: Diagnosis not present

## 2022-03-23 DIAGNOSIS — H04121 Dry eye syndrome of right lacrimal gland: Secondary | ICD-10-CM | POA: Diagnosis not present

## 2022-03-23 DIAGNOSIS — H04123 Dry eye syndrome of bilateral lacrimal glands: Secondary | ICD-10-CM | POA: Diagnosis not present

## 2022-03-23 DIAGNOSIS — H18413 Arcus senilis, bilateral: Secondary | ICD-10-CM | POA: Diagnosis not present

## 2022-03-23 DIAGNOSIS — H26493 Other secondary cataract, bilateral: Secondary | ICD-10-CM | POA: Diagnosis not present

## 2022-03-26 ENCOUNTER — Telehealth: Payer: Self-pay | Admitting: Hematology

## 2022-03-26 NOTE — Telephone Encounter (Signed)
.  Called patient to schedule appointment per 7/17 inbasket, patient is aware of date and time.   

## 2022-03-26 NOTE — Telephone Encounter (Signed)
Called pt to sch per 7/17 inbasket, pt stated she overreacted and did not need to come in for ov, desk nurse notiifed

## 2022-03-28 ENCOUNTER — Telehealth: Payer: Self-pay

## 2022-03-28 NOTE — Telephone Encounter (Signed)
Emailed photos to for pre-authorication To: [email or web address]  %%% OEV#[id number]

## 2022-03-29 ENCOUNTER — Telehealth: Payer: Self-pay

## 2022-03-29 NOTE — Telephone Encounter (Signed)
Faxed notes that include bra size and weight chart to June at [facility] as she requested.

## 2022-04-02 ENCOUNTER — Other Ambulatory Visit: Payer: Self-pay

## 2022-04-06 DIAGNOSIS — H26491 Other secondary cataract, right eye: Secondary | ICD-10-CM | POA: Diagnosis not present

## 2022-04-09 ENCOUNTER — Other Ambulatory Visit: Payer: Self-pay

## 2022-04-12 ENCOUNTER — Encounter (HOSPITAL_COMMUNITY): Payer: Self-pay

## 2022-04-12 ENCOUNTER — Telehealth: Payer: Self-pay | Admitting: Plastic Surgery

## 2022-04-12 ENCOUNTER — Inpatient Hospital Stay (HOSPITAL_COMMUNITY)
Admission: EM | Admit: 2022-04-12 | Discharge: 2022-04-15 | DRG: 390 | Disposition: A | Discharge status: Home / Self Care | Payer: Federal, State, Local not specified - PPO | Attending: Internal Medicine | Admitting: Internal Medicine

## 2022-04-12 ENCOUNTER — Other Ambulatory Visit: Payer: Self-pay

## 2022-04-12 DIAGNOSIS — M797 Fibromyalgia: Secondary | ICD-10-CM | POA: Diagnosis not present

## 2022-04-12 DIAGNOSIS — Z923 Personal history of irradiation: Secondary | ICD-10-CM

## 2022-04-12 DIAGNOSIS — R739 Hyperglycemia, unspecified: Secondary | ICD-10-CM | POA: Diagnosis present

## 2022-04-12 DIAGNOSIS — M1990 Unspecified osteoarthritis, unspecified site: Secondary | ICD-10-CM | POA: Diagnosis present

## 2022-04-12 DIAGNOSIS — E785 Hyperlipidemia, unspecified: Secondary | ICD-10-CM | POA: Diagnosis present

## 2022-04-12 DIAGNOSIS — T451X5A Adverse effect of antineoplastic and immunosuppressive drugs, initial encounter: Secondary | ICD-10-CM | POA: Diagnosis not present

## 2022-04-12 DIAGNOSIS — G500 Trigeminal neuralgia: Secondary | ICD-10-CM | POA: Diagnosis present

## 2022-04-12 DIAGNOSIS — Z86010 Personal history of colonic polyps: Secondary | ICD-10-CM

## 2022-04-12 DIAGNOSIS — Z803 Family history of malignant neoplasm of breast: Secondary | ICD-10-CM | POA: Diagnosis not present

## 2022-04-12 DIAGNOSIS — K56600 Partial intestinal obstruction, unspecified as to cause: Secondary | ICD-10-CM | POA: Diagnosis present

## 2022-04-12 DIAGNOSIS — Z881 Allergy status to other antibiotic agents status: Secondary | ICD-10-CM

## 2022-04-12 DIAGNOSIS — Z87442 Personal history of urinary calculi: Secondary | ICD-10-CM

## 2022-04-12 DIAGNOSIS — K5651 Intestinal adhesions [bands], with partial obstruction: Principal | ICD-10-CM | POA: Diagnosis present

## 2022-04-12 DIAGNOSIS — Z6832 Body mass index (BMI) 32.0-32.9, adult: Secondary | ICD-10-CM

## 2022-04-12 DIAGNOSIS — R1032 Left lower quadrant pain: Secondary | ICD-10-CM | POA: Diagnosis not present

## 2022-04-12 DIAGNOSIS — Z8619 Personal history of other infectious and parasitic diseases: Secondary | ICD-10-CM

## 2022-04-12 DIAGNOSIS — Z853 Personal history of malignant neoplasm of breast: Secondary | ICD-10-CM

## 2022-04-12 DIAGNOSIS — K56609 Unspecified intestinal obstruction, unspecified as to partial versus complete obstruction: Secondary | ICD-10-CM | POA: Diagnosis not present

## 2022-04-12 DIAGNOSIS — I700 Atherosclerosis of aorta: Secondary | ICD-10-CM | POA: Diagnosis not present

## 2022-04-12 DIAGNOSIS — Z79811 Long term (current) use of aromatase inhibitors: Secondary | ICD-10-CM

## 2022-04-12 DIAGNOSIS — G620 Drug-induced polyneuropathy: Secondary | ICD-10-CM | POA: Diagnosis present

## 2022-04-12 DIAGNOSIS — Z90711 Acquired absence of uterus with remaining cervical stump: Secondary | ICD-10-CM | POA: Diagnosis not present

## 2022-04-12 DIAGNOSIS — C50411 Malignant neoplasm of upper-outer quadrant of right female breast: Secondary | ICD-10-CM

## 2022-04-12 DIAGNOSIS — R109 Unspecified abdominal pain: Secondary | ICD-10-CM | POA: Diagnosis not present

## 2022-04-12 DIAGNOSIS — R9431 Abnormal electrocardiogram [ECG] [EKG]: Secondary | ICD-10-CM | POA: Diagnosis not present

## 2022-04-12 DIAGNOSIS — K5730 Diverticulosis of large intestine without perforation or abscess without bleeding: Secondary | ICD-10-CM | POA: Diagnosis not present

## 2022-04-12 DIAGNOSIS — M549 Dorsalgia, unspecified: Secondary | ICD-10-CM | POA: Diagnosis present

## 2022-04-12 DIAGNOSIS — E876 Hypokalemia: Secondary | ICD-10-CM | POA: Diagnosis not present

## 2022-04-12 DIAGNOSIS — E669 Obesity, unspecified: Secondary | ICD-10-CM | POA: Diagnosis not present

## 2022-04-12 DIAGNOSIS — G8929 Other chronic pain: Secondary | ICD-10-CM | POA: Diagnosis present

## 2022-04-12 DIAGNOSIS — H269 Unspecified cataract: Secondary | ICD-10-CM | POA: Diagnosis present

## 2022-04-12 DIAGNOSIS — Z96652 Presence of left artificial knee joint: Secondary | ICD-10-CM | POA: Diagnosis present

## 2022-04-12 DIAGNOSIS — Z170 Estrogen receptor positive status [ER+]: Secondary | ICD-10-CM

## 2022-04-12 DIAGNOSIS — Z79899 Other long term (current) drug therapy: Secondary | ICD-10-CM

## 2022-04-12 DIAGNOSIS — H40009 Preglaucoma, unspecified, unspecified eye: Secondary | ICD-10-CM | POA: Diagnosis present

## 2022-04-12 LAB — CBC WITH DIFFERENTIAL/PLATELET
Abs Immature Granulocytes: 0.03 10*3/uL (ref 0.00–0.07)
Basophils Absolute: 0 10*3/uL (ref 0.0–0.1)
Basophils Relative: 1 %
Eosinophils Absolute: 0.1 10*3/uL (ref 0.0–0.5)
Eosinophils Relative: 1 %
HCT: 45.3 % (ref 36.0–46.0)
Hemoglobin: 14.6 g/dL (ref 12.0–15.0)
Immature Granulocytes: 0 %
Lymphocytes Relative: 13 %
Lymphs Abs: 1 10*3/uL (ref 0.7–4.0)
MCH: 29.6 pg (ref 26.0–34.0)
MCHC: 32.2 g/dL (ref 30.0–36.0)
MCV: 91.9 fL (ref 80.0–100.0)
Monocytes Absolute: 0.3 10*3/uL (ref 0.1–1.0)
Monocytes Relative: 3 %
Neutro Abs: 6.2 10*3/uL (ref 1.7–7.7)
Neutrophils Relative %: 82 %
Platelets: 252 10*3/uL (ref 150–400)
RBC: 4.93 MIL/uL (ref 3.87–5.11)
RDW: 13.6 % (ref 11.5–15.5)
WBC: 7.7 10*3/uL (ref 4.0–10.5)
nRBC: 0 % (ref 0.0–0.2)

## 2022-04-12 LAB — COMPREHENSIVE METABOLIC PANEL WITH GFR
ALT: 15 U/L (ref 0–44)
AST: 26 U/L (ref 15–41)
Albumin: 3.9 g/dL (ref 3.5–5.0)
Alkaline Phosphatase: 102 U/L (ref 38–126)
Anion gap: 8 (ref 5–15)
BUN: 10 mg/dL (ref 8–23)
CO2: 29 mmol/L (ref 22–32)
Calcium: 9.4 mg/dL (ref 8.9–10.3)
Chloride: 103 mmol/L (ref 98–111)
Creatinine, Ser: 0.62 mg/dL (ref 0.44–1.00)
GFR, Estimated: >60 mL/min
Glucose, Bld: 121 mg/dL — ABNORMAL HIGH (ref 70–99)
Potassium: 4.3 mmol/L (ref 3.5–5.1)
Sodium: 140 mmol/L (ref 135–145)
Total Bilirubin: 1.4 mg/dL — ABNORMAL HIGH (ref 0.3–1.2)
Total Protein: 7.3 g/dL (ref 6.5–8.1)

## 2022-04-12 LAB — LIPASE, BLOOD: Lipase: 22 U/L (ref 11–51)

## 2022-04-12 NOTE — ED Triage Notes (Signed)
LLQ abdominal pain that began last night.   Reports hx of diverticulitis and says the pain is similar.

## 2022-04-12 NOTE — ED Notes (Signed)
Unable to urinate at this time.  

## 2022-04-12 NOTE — Telephone Encounter (Signed)
Denial from [employer] fed to [place] to review - has not consulted yet orig pace patient

## 2022-04-12 NOTE — ED Provider Triage Note (Signed)
Emergency Medicine Provider Triage Evaluation Note  Summer [name] , a 71 y.o. female  was evaluated in triage.  Pt complains of left sided abdominal pain since yesterday.  She states that this may be diverticulitis. Has vomited once.   Had a normal BM earlier.   Physical Exam  BP (!) 159/83   Pulse 94   Temp 98.3 F (36.8 C) (Oral)   Resp 18   Ht '5\' 6"'$  (1.676 m)   Wt 90.7 kg   SpO2 98%   BMI 32.28 kg/m  Gen:   Awake, no distress   Resp:  Normal effort  MSK:   Moves extremities without difficulty  Other:  Abdomen ttp left sided abdomen and epigastric  Medical Decision Making  Medically screening exam initiated at 11:02 PM.  Appropriate orders placed.  Summer [name] was informed that the remainder of the evaluation will be completed by another provider, this initial triage assessment does not replace that evaluation, and the importance of remaining in the ED until their evaluation is complete.     Lorin Glass, [place] 04/12/22 2307

## 2022-04-13 ENCOUNTER — Emergency Department (HOSPITAL_COMMUNITY): Payer: Federal, State, Local not specified - PPO

## 2022-04-13 ENCOUNTER — Observation Stay (HOSPITAL_COMMUNITY): Payer: Federal, State, Local not specified - PPO

## 2022-04-13 ENCOUNTER — Inpatient Hospital Stay (HOSPITAL_COMMUNITY): Payer: Federal, State, Local not specified - PPO

## 2022-04-13 ENCOUNTER — Encounter (HOSPITAL_COMMUNITY): Payer: Self-pay

## 2022-04-13 DIAGNOSIS — H269 Unspecified cataract: Secondary | ICD-10-CM | POA: Diagnosis present

## 2022-04-13 DIAGNOSIS — Z803 Family history of malignant neoplasm of breast: Secondary | ICD-10-CM | POA: Diagnosis not present

## 2022-04-13 DIAGNOSIS — I700 Atherosclerosis of aorta: Secondary | ICD-10-CM | POA: Diagnosis not present

## 2022-04-13 DIAGNOSIS — Z87442 Personal history of urinary calculi: Secondary | ICD-10-CM | POA: Diagnosis not present

## 2022-04-13 DIAGNOSIS — Z90711 Acquired absence of uterus with remaining cervical stump: Secondary | ICD-10-CM | POA: Diagnosis not present

## 2022-04-13 DIAGNOSIS — K56600 Partial intestinal obstruction, unspecified as to cause: Secondary | ICD-10-CM | POA: Diagnosis present

## 2022-04-13 DIAGNOSIS — Z6832 Body mass index (BMI) 32.0-32.9, adult: Secondary | ICD-10-CM | POA: Diagnosis not present

## 2022-04-13 DIAGNOSIS — K5730 Diverticulosis of large intestine without perforation or abscess without bleeding: Secondary | ICD-10-CM | POA: Diagnosis present

## 2022-04-13 DIAGNOSIS — E785 Hyperlipidemia, unspecified: Secondary | ICD-10-CM | POA: Diagnosis present

## 2022-04-13 DIAGNOSIS — E876 Hypokalemia: Secondary | ICD-10-CM | POA: Diagnosis present

## 2022-04-13 DIAGNOSIS — Z86010 Personal history of colonic polyps: Secondary | ICD-10-CM | POA: Diagnosis not present

## 2022-04-13 DIAGNOSIS — Z96652 Presence of left artificial knee joint: Secondary | ICD-10-CM | POA: Diagnosis present

## 2022-04-13 DIAGNOSIS — G500 Trigeminal neuralgia: Secondary | ICD-10-CM | POA: Diagnosis present

## 2022-04-13 DIAGNOSIS — K5669 Other partial intestinal obstruction: Secondary | ICD-10-CM | POA: Diagnosis not present

## 2022-04-13 DIAGNOSIS — R1032 Left lower quadrant pain: Secondary | ICD-10-CM | POA: Diagnosis not present

## 2022-04-13 DIAGNOSIS — T451X5A Adverse effect of antineoplastic and immunosuppressive drugs, initial encounter: Secondary | ICD-10-CM | POA: Diagnosis present

## 2022-04-13 DIAGNOSIS — Z8619 Personal history of other infectious and parasitic diseases: Secondary | ICD-10-CM | POA: Diagnosis not present

## 2022-04-13 DIAGNOSIS — H40009 Preglaucoma, unspecified, unspecified eye: Secondary | ICD-10-CM | POA: Diagnosis present

## 2022-04-13 DIAGNOSIS — Z853 Personal history of malignant neoplasm of breast: Secondary | ICD-10-CM | POA: Diagnosis not present

## 2022-04-13 DIAGNOSIS — Z923 Personal history of irradiation: Secondary | ICD-10-CM | POA: Diagnosis not present

## 2022-04-13 DIAGNOSIS — Z4659 Encounter for fitting and adjustment of other gastrointestinal appliance and device: Secondary | ICD-10-CM | POA: Diagnosis not present

## 2022-04-13 DIAGNOSIS — G8929 Other chronic pain: Secondary | ICD-10-CM | POA: Diagnosis present

## 2022-04-13 DIAGNOSIS — E669 Obesity, unspecified: Secondary | ICD-10-CM | POA: Diagnosis present

## 2022-04-13 DIAGNOSIS — G620 Drug-induced polyneuropathy: Secondary | ICD-10-CM | POA: Diagnosis present

## 2022-04-13 DIAGNOSIS — K6389 Other specified diseases of intestine: Secondary | ICD-10-CM | POA: Diagnosis not present

## 2022-04-13 DIAGNOSIS — R109 Unspecified abdominal pain: Secondary | ICD-10-CM | POA: Diagnosis not present

## 2022-04-13 DIAGNOSIS — R739 Hyperglycemia, unspecified: Secondary | ICD-10-CM | POA: Diagnosis present

## 2022-04-13 DIAGNOSIS — M1990 Unspecified osteoarthritis, unspecified site: Secondary | ICD-10-CM | POA: Diagnosis present

## 2022-04-13 DIAGNOSIS — K5939 Other megacolon: Secondary | ICD-10-CM | POA: Diagnosis not present

## 2022-04-13 DIAGNOSIS — M549 Dorsalgia, unspecified: Secondary | ICD-10-CM | POA: Diagnosis present

## 2022-04-13 DIAGNOSIS — M797 Fibromyalgia: Secondary | ICD-10-CM | POA: Diagnosis present

## 2022-04-13 DIAGNOSIS — K5651 Intestinal adhesions [bands], with partial obstruction: Secondary | ICD-10-CM | POA: Diagnosis present

## 2022-04-13 LAB — HIV ANTIBODY (ROUTINE TESTING W REFLEX): HIV Screen 4th Generation wRfx: NONREACTIVE

## 2022-04-13 LAB — HEMOGLOBIN A1C
Hgb A1c MFr Bld: 5.3 % (ref 4.8–5.6)
Mean Plasma Glucose: 105.41 mg/dL

## 2022-04-13 LAB — URINALYSIS, ROUTINE W REFLEX MICROSCOPIC
Bacteria, UA: NONE SEEN
Bilirubin Urine: NEGATIVE
Glucose, UA: NEGATIVE mg/dL
Ketones, ur: 20 mg/dL — AB
Leukocytes,Ua: NEGATIVE
Nitrite: NEGATIVE
Protein, ur: NEGATIVE mg/dL
Specific Gravity, Urine: 1.044 — ABNORMAL HIGH (ref 1.005–1.030)
pH: 8 (ref 5.0–8.0)

## 2022-04-13 MED ORDER — IOHEXOL 300 MG/ML  SOLN
80.0000 mL | Freq: Once | INTRAMUSCULAR | Status: AC | PRN
  Administered 2022-04-13: 100 mL via INTRAVENOUS

## 2022-04-13 MED ORDER — ONDANSETRON HCL 4 MG/2ML IJ SOLN
4.0000 mg | Freq: Four times a day (QID) | INTRAMUSCULAR | Status: DC | PRN
Start: 2022-04-13 — End: 2022-04-15

## 2022-04-13 MED ORDER — DIATRIZOATE MEGLUMINE & SODIUM 66-10 % PO SOLN
90.0000 mL | Freq: Once | ORAL | Status: AC
  Administered 2022-04-13: 90 mL via NASOGASTRIC
  Filled 2022-04-13: qty 90

## 2022-04-13 MED ORDER — LACTATED RINGERS IV SOLN: INTRAVENOUS | Status: DC

## 2022-04-13 MED ORDER — SODIUM CHLORIDE 0.9 % IV BOLUS
1000.0000 mL | Freq: Once | INTRAVENOUS | Status: AC
  Administered 2022-04-13: 1000 mL via INTRAVENOUS

## 2022-04-13 MED ORDER — HYDROMORPHONE HCL 1 MG/ML IJ SOLN
0.5000 mg | Freq: Once | INTRAMUSCULAR | Status: AC
  Administered 2022-04-13: 0.5 mg via INTRAVENOUS
  Filled 2022-04-13: qty 1

## 2022-04-13 MED ORDER — PHENOL 1.4 % MT LIQD
2.0000 | OROMUCOSAL | Status: DC | PRN
  Administered 2022-04-13: 2 via OROMUCOSAL
  Filled 2022-04-13: qty 177

## 2022-04-13 MED ORDER — HYDROMORPHONE HCL 1 MG/ML IJ SOLN
0.5000 mg | INTRAMUSCULAR | Status: DC | PRN
  Administered 2022-04-13 (×4): 0.5 mg via INTRAVENOUS
  Filled 2022-04-13 (×4): qty 0.5

## 2022-04-13 NOTE — H&P (Signed)
History and Physical    Patient: Summer [name] JQG:[id number] DOB: 07-13-1951 DOA: 04/12/2022 DOS: the patient was seen and examined on 04/13/2022 PCP: Hoyt Koch, MD  Patient coming from: Home  Chief Complaint:  Chief Complaint  Patient presents with   Abdominal Pain   HPI: Summer [name] is a 71 y.o. female with medical history significant of breast cancer, trigeminal neuralgia. Presenting with stomach pain. Her symptoms started 2 nights ago. There was severe, sharp pain in her epigastric and LUQ areas. She tried some alka seltzer, MoM and pepto. They seemed to help a little. She had N but no V. She reports some loose stools. When she woke yesterday, she didn't feel like eating much. Her abdominal pain persisted despite taking more alka seltzer and MoM. When her symptoms did not improve last night, she decided to come to the ED for assistance.   .   Review of Systems: As mentioned in the history of present illness. All other systems reviewed and are negative. 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   Cataract    bilateral - no sx as of 09/23/2020)   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   Complication of anesthesia    Pt states having some "difficulty waking up"   Constipation 07/01/2018   Costochondritis 05/04/2014   Diverticulitis of colon 06/13/2007   S/p segmental colectomy for perforated diverticulitis    Diverticulosis    Facial pain 10/06/2015   Fibromyalgia    Foot sprain, left, initial encounter 08/31/2016   Glossitis 02/02/2016   Headache 1/97/5883   Helicobacter pylori gastritis 10/2020   HEMATURIA UNSPECIFIED 06/25/2008   Qualifier: Diagnosis of  By: Sherren Mocha MD, Dellis Filbert A     History of kidney stones    History of shingles 2012--  no residual pain   Hx of adenomatous colonic polyps 10/2020   Itching 10/13/2018   Left eye pain 07/01/2018   Left ureteral calculus    LOC OSTEOARTHROS NOT SPEC PRIM/SEC OTH [facility] SITE 06/27/2009   Qualifier: Diagnosis of  By: Sherren Mocha MD, Dellis Filbert A    Malignant neoplasm of upper-outer quadrant of right breast in female, estrogen receptor positive ([place]) 11/17/2018   MCI (mild cognitive impairment)    Memory loss 12/21/2016   Morbid obesity ([place]) 11/09/2010   Pain of right side of body 08/31/2016   Personal history of chemotherapy    Personal history of radiation therapy    Pruritus 10/26/2016   Renal disorder    hx of   Right sided temporal headache 12/21/2016   Tremor 12/21/2016   Trigeminal neuralgia RIGHT   Trigeminal neuralgia of right side of face 11/30/2010   Trochanteric bursitis, right hip 10/26/2016   Urgency of urination    Weight loss 07/22/2017   Zoster without complications 2/54/9826   Past Surgical History:  Procedure Laterality Date   BREAST BIOPSY Right 11/11/2018   BREAST LUMPECTOMY Right 11/28/2018   BREAST LUMPECTOMY WITH RADIOACTIVE SEED AND SENTINEL LYMPH NODE BIOPSY Right 11/28/2018   Procedure: RIGHT BREAST LUMPECTOMY WITH RADIOACTIVE SEED AND RIGHT AXILLARY SENTINEL LYMPH NODE BIOPSY;  Surgeon: Excell Seltzer, MD;  Location: [place];  Service: General;  Laterality:  Right;   CEREBRAL MICROVASCULAR DECOMPRESSION  07-05-2006   RIGHT TRIGEMINAL NERVE   COLONOSCOPY  2013   CG-MAC-moviprep (exc)-normal -mild tics   CYSTOSCOPY/RETROGRADE/URETEROSCOPY/STONE EXTRACTION WITH BASKET  07/04/2012   Procedure: CYSTOSCOPY/RETROGRADE/URETEROSCOPY/STONE EXTRACTION WITH BASKET;  Surgeon: Claybon Jabs, MD;  Location[facility];  Service: Urology;  Laterality: Left;   EXPLORATORY LAPAROTOMY/ RESECTION MID TO DISTAL SIGMOID AND PROXIMAL RECTUM/ END PROXIMAL SIGMOID COLOSTOMY  07-17-2006   PERFORATED  DIVERTICULITIS WITH PERITONITIS   EXTRACORPOREAL SHOCK WAVE LITHOTRIPSY Left 01/19/2021   Procedure: LEFT EXTRACORPOREAL SHOCK WAVE LITHOTRIPSY (ESWL);  Surgeon: Bjorn Loser, MD;  Location: [facility];  Service: Urology;  Laterality: Left;   gamma knife  05/17/2016   for trigeminal neuralgia, [facility], Dr Salomon Fick   gamma knife  2019   IR CV LINE INJECTION  10/27/2019   PORTACATH PLACEMENT Left 11/28/2018   Procedure: INSERTION PORT-A-CATH WITH ULTRASOUND;  Surgeon: Excell Seltzer, MD;  Location: [place];  Service: General;  Laterality: Left;   portacath removal     RESECTION COLOSTOMY/ CLOSURE COLOSTOMY WITH COLOPROCTOSTOMY  02-20-2007   TONSILLECTOMY  AS CHILD   TOTAL KNEE ARTHROPLASTY Left 06/20/2020   Procedure: LEFT TOTAL KNEE ARTHROPLASTY;  Surgeon: Leandrew Koyanagi, MD;  Location: [place];  Service: Orthopedics;  Laterality: Left;   URETEROSCOPY  07/04/2012   Procedure: URETEROSCOPY;  Surgeon: Claybon Jabs, MD;  Location: [facility];  Service: Urology;  Laterality: Left;   VAGINAL HYSTERECTOMY  1998   Partial   WISDOM TOOTH EXTRACTION     Social History:  reports that she has never smoked. She has never used smokeless tobacco. She reports current alcohol use. She reports that she does not use drugs.  Allergies  Allergen Reactions   Keflex [Cephalexin] Anaphylaxis and Other (See Comments)    Caused violent dizziness and syncope   Clindamycin Hcl Itching    Family History  Problem Relation Age of Onset   Heart disease Maternal Grandmother    Diabetes Mother    Breast cancer Maternal Aunt    Lupus Cousin    Colon cancer Neg Hx    Esophageal cancer Neg Hx    Rectal cancer Neg Hx    Stomach cancer Neg Hx    Colon polyps Neg Hx     Prior to Admission medications   Medication Sig Start Date End Date Taking? Authorizing Provider  anastrozole (ARIMIDEX) 1 MG tablet Take 1 tablet (1 mg total) by mouth daily. Patient taking differently: Take  1 mg by mouth at bedtime. 02/15/22  Yes Alla Feeling, NP  DULoxetine (CYMBALTA) 60 MG capsule TAKE 1 CAPSULE BY MOUTH EVERY DAY Patient taking differently: Take 60 mg by mouth at bedtime. 10/17/21  Yes Suzzanne Cloud, NP  gabapentin (NEURONTIN) 600 MG tablet TAKE 2 TABLETS BY MOUTH 3 TIMES A DAY. Please call [phone] to schedule an appt. Patient taking differently: Take 1,200 mg by mouth 3 (three) times daily. TAKE 2 TABLETS BY MOUTH 3 TIMES A DAY. Please call [phone] to schedule an appt. 10/17/21  Yes Suzzanne Cloud, NP  pravastatin (PRAVACHOL) 20 MG tablet Take 1 tablet (20 mg total) by mouth daily. Patient taking differently: Take 20 mg by mouth every evening. 10/25/21  Yes Hoyt Koch, MD  Semaglutide-Weight Management 0.25 MG/0.5ML SOAJ Inject 0.25 mg into the skin once a week. 03/08/22  Yes Hoyt Koch, MD  diclofenac Sodium (VOLTAREN) 1 % GEL Apply 2 g topically 4 (four)  times daily. Patient not taking: Reported on 04/13/2022 10/13/20   Aundra Dubin, PA-C  Semaglutide-Weight Management (WEGOVY) 0.5 MG/0.5ML SOAJ Inject 0.5 mg into the skin once a week. Patient not taking: Reported on 04/13/2022 03/08/22   Hoyt Koch, MD    Physical Exam: Vitals:   04/13/22 0230 04/13/22 0245 04/13/22 0300 04/13/22 0417  BP: (!) 156/75 (!) 156/74 (!) 164/96 (!) 152/128  Pulse:  85 98 74  Resp: _0 Temp:    98.5 F (36.9 C)  TempSrc:    Oral  SpO2:   99% 98%  Weight:      Height:       General: 71 y.o. female resting in bed in NAD Eyes: PERRL, normal sclera ENMT: Nares patent w/o discharge, orophaynx clear, dentition normal, ears w/o discharge/lesions/ulcers Neck: Supple, trachea midline Cardiovascular: RRR, +S1, S2, no m/g/r, equal pulses throughout Respiratory: CTABL, no w/r/r, normal WOB GI: BS+, ND, mild epigastric TTP, no masses noted, no organomegaly noted MSK: No e/c/c Neuro: A&O x 3, no focal deficits Psyc: Appropriate interaction and affect,  calm/cooperative  Data Reviewed:  Lab Results  Component Value Date   NA 140 04/12/2022   K 4.3 04/12/2022   CO2 29 04/12/2022   GLUCOSE 121 (H) 04/12/2022   BUN 10 04/12/2022   CREATININE 0.62 04/12/2022   CALCIUM 9.4 04/12/2022   GFRNONAA >60 04/12/2022    Lab Results  Component Value Date   WBC 7.7 04/12/2022   HGB 14.6 04/12/2022   HCT 45.3 04/12/2022   MCV 91.9 04/12/2022   PLT 252 04/12/2022   CT ab/pelvis w/ contrast Multiple dilated loops of jejunum with transition zone in the anterior abdomen likely related to adhesions. This is consistent with a partial small bowel obstruction. Diverticulosis without diverticulitis. Anterior abdominal wall fat containing hernias as described. Nonobstructing right renal calculi.  EKG: sinus, no st elevations  Assessment and Plan: pSBO     - placed in obs, med-surg     - SBO protocol started; NGT in place     - CCS to see, appreciate assistance     - fluids  Hx of breast cancer     - continue home regimen when cleared for PO  Trigeminal neuralgia     - continue home regimen when cleared for PO  Hyperglycemia     - no formal Dx of DM     - she's on wegovy for weight mgmt     - check A1c  HLD     - continue home regimen when cleared for PO  Advance Care Planning:   Code Status: FULL  Consults: CCS  Family Communication: None at bedside  Severity of Illness: The appropriate patient status for this patient is OBSERVATION. Observation status is judged to be reasonable and necessary in order to provide the required intensity of service to ensure the patient's safety. The patient's presenting symptoms, physical exam findings, and initial radiographic and laboratory data in the context of their medical condition is felt to place them at decreased risk for further clinical deterioration. Furthermore, it is anticipated that the patient will be medically stable for discharge from the hospital within 2 midnights of admission.    Author: Jonnie Finner, DO 04/13/2022 7:14 AM  For on call review [email or web address].

## 2022-04-13 NOTE — ED Provider Notes (Signed)
[facility] Emergency Department Provider Note MRN:  [id number]  [place] date & time: 04/13/22     Chief Complaint   Abdominal Pain   History of Present Illness   Summer [name] is a 71 y.o. year-old female with a history of hysterectomy, diverticulitis presenting to the ED with chief complaint of abdominal pain.  Diffuse abdominal pain worse in the left lower quadrant for the past few days.  Associated with nausea, no vomiting.  Somewhat decreased bowel movements today.  Denies fever.  Review of Systems  A thorough review of systems was obtained and all systems are negative except as noted in the HPI and PMH.   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   Complication of anesthesia    Pt states having some "difficulty waking up"   Constipation 07/01/2018   Costochondritis 05/04/2014   Diverticulitis of colon 06/13/2007   S/p segmental colectomy for perforated diverticulitis    Diverticulosis    Facial pain 10/06/2015   Fibromyalgia    Foot sprain, left, initial encounter 08/31/2016   Glossitis 02/02/2016   Headache 4/56/2563   Helicobacter pylori gastritis 10/2020   HEMATURIA UNSPECIFIED 06/25/2008   Qualifier: Diagnosis of  By: Sherren Mocha MD, Dellis Filbert A    History of kidney stones    History of shingles 2012--  no residual pain   Hx of adenomatous colonic polyps 10/2020   Itching 10/13/2018   Left eye pain 07/01/2018   Left ureteral calculus    LOC OSTEOARTHROS NOT SPEC PRIM/SEC OTH [facility] SITE 06/27/2009   Qualifier: Diagnosis of  By: Sherren Mocha MD, Dellis Filbert A     Malignant neoplasm of upper-outer quadrant of right breast in female, estrogen receptor positive ([place]) 11/17/2018   MCI (mild cognitive impairment)    Memory loss 12/21/2016   Morbid obesity ([place]) 11/09/2010   Pain of right side of body 08/31/2016   Personal history of chemotherapy    Personal history of radiation therapy    Pruritus 10/26/2016   Renal disorder    hx of   Right sided temporal headache 12/21/2016   Tremor 12/21/2016   Trigeminal neuralgia RIGHT   Trigeminal neuralgia of right side of face 11/30/2010   Trochanteric bursitis, right hip 10/26/2016   Urgency of urination    Weight loss 07/22/2017   Zoster without complications 8/93/7342    Past Surgical History:  Procedure Laterality Date   BREAST BIOPSY Right 11/11/2018   BREAST LUMPECTOMY Right 11/28/2018   BREAST LUMPECTOMY WITH RADIOACTIVE SEED AND SENTINEL LYMPH NODE BIOPSY Right 11/28/2018   Procedure: RIGHT BREAST LUMPECTOMY WITH RADIOACTIVE SEED AND RIGHT AXILLARY SENTINEL LYMPH NODE BIOPSY;  Surgeon: Excell Seltzer, MD;  Location: [place];  Service: General;  Laterality: Right;   CEREBRAL MICROVASCULAR DECOMPRESSION  07-05-2006   RIGHT TRIGEMINAL NERVE   COLONOSCOPY  2013   CG-MAC-moviprep (exc)-normal -mild tics   CYSTOSCOPY/RETROGRADE/URETEROSCOPY/STONE EXTRACTION WITH BASKET  07/04/2012   Procedure: CYSTOSCOPY/RETROGRADE/URETEROSCOPY/STONE EXTRACTION WITH BASKET;  Surgeon: Claybon Jabs, MD;  Location: [place];  Service: Urology;  Laterality: Left;   EXPLORATORY LAPAROTOMY/ RESECTION MID TO DISTAL SIGMOID AND PROXIMAL RECTUM/ END PROXIMAL SIGMOID COLOSTOMY  07-17-2006   PERFORATED DIVERTICULITIS WITH PERITONITIS   EXTRACORPOREAL SHOCK WAVE LITHOTRIPSY Left 01/19/2021   Procedure: LEFT EXTRACORPOREAL SHOCK WAVE LITHOTRIPSY (ESWL);  Surgeon: Bjorn Loser, MD;  Location[facility];  Service: Urology;  Laterality: Left;   gamma knife  05/17/2016   for trigeminal neuralgia, [facility]  [facility], Dr Salomon Fick   gamma knife  2019   IR CV LINE INJECTION  10/27/2019   PORTACATH PLACEMENT Left 11/28/2018   Procedure: INSERTION PORT-A-CATH WITH ULTRASOUND;  Surgeon: Excell Seltzer, MD;  Location: [place];  Service: General;  Laterality: Left;   portacath removal     RESECTION COLOSTOMY/ CLOSURE COLOSTOMY WITH COLOPROCTOSTOMY  02-20-2007   TONSILLECTOMY  AS CHILD   TOTAL KNEE ARTHROPLASTY Left 06/20/2020   Procedure: LEFT TOTAL KNEE ARTHROPLASTY;  Surgeon: Leandrew Koyanagi, MD;  Location: [place];  Service: Orthopedics;  Laterality: Left;   URETEROSCOPY  07/04/2012   Procedure: URETEROSCOPY;  Surgeon: Claybon Jabs, MD;  Location[facility];  Service: Urology;  Laterality: Left;   VAGINAL HYSTERECTOMY  1998   Partial   WISDOM TOOTH EXTRACTION      Family History  Problem Relation Age of Onset   Heart disease Maternal Grandmother    Diabetes Mother    Breast cancer Maternal Aunt    Lupus Cousin    Colon cancer Neg Hx    Esophageal cancer Neg Hx    Rectal cancer Neg Hx    Stomach cancer Neg Hx    Colon polyps Neg Hx     Social History   Socioeconomic History   Marital status: Married    Spouse name: Not on file   Number of children: 1   Years of education: College   Highest education level: Not on file  Occupational History    Employer: UNEMPLOYED   Occupation: HUMAN RESOURCES     Employer: [employer]  Tobacco Use   Smoking status: Never   Smokeless tobacco: Never  Vaping Use   Vaping Use: Never used  Substance and Sexual Activity   Alcohol use: Yes    Comment: rarely   Drug use: No   Sexual activity: Not on file  Other Topics Concern   Not on file  Social History Narrative   Not on file   Social Determinants of Health   Financial Resource Strain: Not on file  Food Insecurity: Not on file  Transportation Needs: Not on file  Physical Activity: Not on file  Stress: Not on file  Social Connections: Not on file  Intimate Partner  Violence: Not on file     Physical Exam   Vitals:   04/13/22 0145 04/13/22 0230  BP: (!) 148/76 (!) 156/75  Pulse: 89   Resp: 16 19  Temp:    SpO2: 97%     CONSTITUTIONAL: Well-appearing, NAD NEURO/PSYCH:  Alert and oriented x 3, no focal deficits EYES:  eyes equal and reactive ENT/NECK:  no LAD, no JVD CARDIO: Regular rate, well-perfused, normal S1 and S2 PULM:  CTAB no wheezing or rhonchi GI/GU:  non-distended, non-tender MSK/SPINE:  No gross deformities, no edema SKIN:  no rash, atraumatic   *Additional and/or pertinent findings included in MDM below  Diagnostic and Interventional Summary    EKG Interpretation  Date/Time:  Friday April 13 2022 00:01:57 EDT Ventricular Rate:  84 PR Interval:  141  QRS Duration: 107 QT Interval:  390 QTC Calculation: 461 R Axis:   -48 Text Interpretation: Sinus rhythm Left anterior fascicular block Left ventricular hypertrophy Anterior infarct, old Borderline T abnormalities, inferior leads Confirmed by Gerlene Fee [phone]) on 04/13/2022 2:10:24 AM       Labs Reviewed  URINALYSIS, ROUTINE W REFLEX MICROSCOPIC - Abnormal; Notable for the following components:      Result Value   APPearance HAZY (*)    Specific Gravity, Urine 1.044 (*)    Hgb urine dipstick SMALL (*)    Ketones, ur 20 (*)    All other components within normal limits  COMPREHENSIVE METABOLIC PANEL - Abnormal; Notable for the following components:   Glucose, Bld 121 (*)    Total Bilirubin 1.4 (*)    All other components within normal limits  CBC WITH DIFFERENTIAL/PLATELET  LIPASE, BLOOD    CT ABDOMEN PELVIS W CONTRAST  Final Result      Medications  HYDROmorphone (DILAUDID) injection 0.5 mg (has no administration in time range)  sodium chloride 0.9 % bolus 1,000 mL (has no administration in time range)  iohexol (OMNIPAQUE) 300 MG/ML solution 80 mL (100 mLs Intravenous Contrast Given 04/13/22 0109)     Procedures  /  Critical Care .Critical  Care  Performed by: Maudie Flakes, MD Authorized by: Maudie Flakes, MD   Critical care provider statement:    Critical care time (minutes):  35   Critical care was necessary to treat or prevent imminent or life-threatening deterioration of the following conditions: SBO.   Critical care was time spent personally by me on the following activities:  Development of treatment plan with patient or surrogate, discussions with consultants, evaluation of patient's response to treatment, examination of patient, ordering and review of laboratory studies, ordering and review of radiographic studies, ordering and performing treatments and interventions, pulse oximetry, re-evaluation of patient's condition and review of old charts   ED Course and Medical Decision Making  Initial Impression and Ddx Differential diagnosis includes diverticulitis, SBO, appendicitis, perforated viscus.  Awaiting labs, CT  Past medical/surgical history that increases complexity of ED encounter: Diverticulitis  Interpretation of Diagnostics I personally reviewed the laboratory assessment and my interpretation is as follows: No significant blood count or electrolyte disturbance  CT imaging revealing likely jejunal SBO with transition point.  Patient Reassessment and Ultimate Disposition/Management     Admitted to medicine for further care, will place NG tube, provide pain control, Dr. Michaelle Birks of general surgery has been messaged for consultation in the morning for  Patient management required discussion with the following services or consulting groups:  Hospitalist Service  Complexity of Problems Addressed Acute illness or injury that poses threat of life of bodily function  Additional Data Reviewed and Analyzed Further history obtained from: None  Additional Factors Impacting ED Encounter Risk Use of parenteral controlled substances and Consideration of hospitalization  Barth Kirks. [place] Small, [place] [email or web address]  Final Clinical Impressions(s) / ED Diagnoses     ICD-10-CM   1. SBO (small bowel obstruction) ([place])  K56.609       ED Discharge Orders     None        Discharge Instructions Discussed with and Provided to Patient:   Discharge Instructions   None      Maudie Flakes, MD 04/13/22 [phone]

## 2022-04-13 NOTE — Progress Notes (Signed)
Transition of Care [facility]) Screening Note  Patient Details  Name: Summer [name] Date of Birth: 1951-06-19  Transition of Care [facility]) CM/SW Contact:    Sherie Don, LCSW Phone Number: 04/13/2022, 9:58 AM  Transition of Care Department [facility]) has reviewed patient and no TOC needs have been identified at this time. We will continue to monitor patient advancement through interdisciplinary progression rounds. If new patient transition needs arise, please place a TOC consult.

## 2022-04-13 NOTE — Consult Note (Signed)
[facility] 1950/12/28  [id number].    Requesting MD: Dr. Sedonia Small Chief Complaint/Reason for Consult: small bowel obstruction  HPI:  Summer [name] is a 71 yo female who presented to the ED overnight with upper abdominal crampy type pain.  This start 2 days ago with some associated N/V.  She denies any fevers, chills, etc.  Her last BM was yesterday and doesn't recall when she last passed flatus.  Her pain was getting so intense, likely labor pains, that she presented to the ED for evaluation.  She underwent a CT scan that showed small bowel dilation concerning for SBO, with a transition in the lower abdomen. An NG tube was placed in the ED. General surgery was consulted.   Prior abdominal surgeries include a sigmoid colectomy with end colostomy in 2007 by Dr. Dalbert Batman for perforated diverticulitis, followed by colostomy reversal in 2008.  ROS: ROS: see HPI  Family History  Problem Relation Age of Onset   Heart disease Maternal Grandmother    Diabetes Mother    Breast cancer Maternal Aunt    Lupus Cousin    Colon cancer Neg Hx    Esophageal cancer Neg Hx    Rectal cancer Neg Hx    Stomach cancer Neg Hx    Colon polyps Neg Hx     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   Cataract    bilateral - no sx as of 09/23/2020)   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   Complication of anesthesia    Pt states having some "difficulty waking up"   Constipation 07/01/2018   Costochondritis 05/04/2014   Diverticulitis of colon 06/13/2007   S/p segmental colectomy for perforated diverticulitis    Diverticulosis    Facial pain 10/06/2015   Fibromyalgia    Foot sprain, left, initial encounter 08/31/2016   Glossitis  02/02/2016   Headache 7/51/7001   Helicobacter pylori gastritis 10/2020   HEMATURIA UNSPECIFIED 06/25/2008   Qualifier: Diagnosis of  By: Sherren Mocha MD, Dellis Filbert A    History of kidney stones    History of shingles 2012--  no residual pain   Hx of adenomatous colonic polyps 10/2020   Itching 10/13/2018   Left eye pain 07/01/2018   Left ureteral calculus    LOC OSTEOARTHROS NOT SPEC PRIM/SEC OTH [facility] SITE 06/27/2009   Qualifier: Diagnosis of  By: Sherren Mocha MD, Dellis Filbert A    Malignant neoplasm of upper-outer quadrant of right breast in female, estrogen receptor positive ([place]) 11/17/2018   MCI (mild cognitive impairment)    Memory loss 12/21/2016   Morbid obesity ([place]) 11/09/2010   Pain of right side of body 08/31/2016   Personal history of chemotherapy    Personal history of radiation therapy    Pruritus 10/26/2016   Renal disorder    hx of   Right sided temporal headache 12/21/2016   Tremor 12/21/2016   Trigeminal neuralgia RIGHT   Trigeminal neuralgia of right side of face 11/30/2010   Trochanteric bursitis, right hip 10/26/2016   Urgency of urination    Weight loss 07/22/2017   Zoster without complications 7/49/4496    Past Surgical History:  Procedure Laterality Date   BREAST  BIOPSY Right 11/11/2018   BREAST LUMPECTOMY Right 11/28/2018   BREAST LUMPECTOMY WITH RADIOACTIVE SEED AND SENTINEL LYMPH NODE BIOPSY Right 11/28/2018   Procedure: RIGHT BREAST LUMPECTOMY WITH RADIOACTIVE SEED AND RIGHT AXILLARY SENTINEL LYMPH NODE BIOPSY;  Surgeon: Excell Seltzer, MD;  Location: [facility];  Service: General;  Laterality: Right;   CEREBRAL MICROVASCULAR DECOMPRESSION  07-05-2006   RIGHT TRIGEMINAL NERVE   COLONOSCOPY  2013   CG-MAC-moviprep (exc)-normal -mild tics   CYSTOSCOPY/RETROGRADE/URETEROSCOPY/STONE EXTRACTION WITH BASKET  07/04/2012   Procedure: CYSTOSCOPY/RETROGRADE/URETEROSCOPY/STONE EXTRACTION WITH BASKET;  Surgeon: Claybon Jabs, MD;  Location: [facility];  Service: Urology;   Laterality: Left;   EXPLORATORY LAPAROTOMY/ RESECTION MID TO DISTAL SIGMOID AND PROXIMAL RECTUM/ END PROXIMAL SIGMOID COLOSTOMY  07-17-2006   PERFORATED DIVERTICULITIS WITH PERITONITIS   EXTRACORPOREAL SHOCK WAVE LITHOTRIPSY Left 01/19/2021   Procedure: LEFT EXTRACORPOREAL SHOCK WAVE LITHOTRIPSY (ESWL);  Surgeon: Bjorn Loser, MD;  Location: [facility];  Service: Urology;  Laterality: Left;   gamma knife  05/17/2016   for trigeminal neuralgia, [facility], Dr Salomon Fick   gamma knife  2019   IR CV LINE INJECTION  10/27/2019   PORTACATH PLACEMENT Left 11/28/2018   Procedure: INSERTION PORT-A-CATH WITH ULTRASOUND;  Surgeon: Excell Seltzer, MD;  Location: [place];  Service: General;  Laterality: Left;   portacath removal     RESECTION COLOSTOMY/ CLOSURE COLOSTOMY WITH COLOPROCTOSTOMY  02-20-2007   TONSILLECTOMY  AS CHILD   TOTAL KNEE ARTHROPLASTY Left 06/20/2020   Procedure: LEFT TOTAL KNEE ARTHROPLASTY;  Surgeon: Leandrew Koyanagi, MD;  Location: [facility];  Service: Orthopedics;  Laterality: Left;   URETEROSCOPY  07/04/2012   Procedure: URETEROSCOPY;  Surgeon: Claybon Jabs, MD;  Location: [facility];  Service: Urology;  Laterality: Left;   VAGINAL HYSTERECTOMY  1998   Partial   WISDOM TOOTH EXTRACTION      Social History:  reports that she has never smoked. She has never used smokeless tobacco. She reports current alcohol use. She reports that she does not use drugs.  Allergies:  Allergies  Allergen Reactions   Keflex [Cephalexin] Anaphylaxis and Other (See Comments)    Caused violent dizziness and syncope   Clindamycin Hcl Itching    Medications Prior to Admission  Medication Sig Dispense Refill   anastrozole (ARIMIDEX) 1 MG tablet Take 1 tablet (1 mg total) by mouth daily. (Patient taking differently: Take 1 mg by mouth at bedtime.) 90 tablet 3   DULoxetine (CYMBALTA) 60 MG capsule TAKE 1 CAPSULE BY MOUTH EVERY DAY (Patient taking differently: Take 60 mg  by mouth at bedtime.) 90 capsule 1   gabapentin (NEURONTIN) 600 MG tablet TAKE 2 TABLETS BY MOUTH 3 TIMES A DAY. Please call [phone] to schedule an appt. (Patient taking differently: Take 1,200 mg by mouth 3 (three) times daily. TAKE 2 TABLETS BY MOUTH 3 TIMES A DAY. Please call [phone] to schedule an appt.) 540 tablet 4   pravastatin (PRAVACHOL) 20 MG tablet Take 1 tablet (20 mg total) by mouth daily. (Patient taking differently: Take 20 mg by mouth every evening.) 90 tablet 3   Semaglutide-Weight Management 0.25 MG/0.5ML SOAJ Inject 0.25 mg into the skin once a week. 2 mL 5   diclofenac Sodium (VOLTAREN) 1 % GEL Apply 2 g topically 4 (four) times daily. (Patient not taking: Reported on 04/13/2022) 150 g 0   Semaglutide-Weight Management (WEGOVY) 0.5 MG/0.5ML SOAJ Inject 0.5 mg into the skin once a week. (Patient not taking: Reported on 04/13/2022)  2 mL 6     Physical Exam: Blood pressure (!) 152/128, pulse 74, temperature 98.5 F (36.9 C), temperature source Oral, resp. rate 16, height _0  (1.676 m), weight 90.7 kg, SpO2 98 %. General: pleasant, WD, WN black female who is laying in bed in NAD HEENT: head is normocephalic, atraumatic.  Sclera are noninjected.  PERRL.  Ears and nose without any masses or lesions.  Mouth is pink and moist Heart: regular, rate, and rhythm.  Normal s1,s2. No obvious murmurs, gallops, or rubs noted.  Palpable radial and pedal pulses bilaterally Lungs: CTAB, no wheezes, rhonchi, or rales noted.  Respiratory effort nonlabored Abd: soft, mildly tender in upper abdomen, ND, but slightly more full in upper abdomen, some BS, NGT in place with essentially only spit type output noted.  No bile. no masses, hernias, or organomegaly.  Lower midline scar noted from prior abdominal surgery. MS: all 4 extremities are symmetrical with no cyanosis, clubbing, or edema. Skin: warm and dry with no masses, lesions, or rashes Psych: A&Ox3 with an appropriate affect.   Results for  orders placed or performed during the hospital encounter of 04/12/22 (from the past 48 hour(s))  CBC with Differential     Status: None   Collection Time: 04/12/22 11:13 PM  Result Value Ref Range   WBC 7.7 4.0 - 10.5 K/uL   RBC 4.93 3.87 - 5.11 MIL/uL   Hemoglobin 14.6 12.0 - 15.0 g/dL   HCT 45.3 36.0 - 46.0 %   MCV 91.9 80.0 - 100.0 fL   MCH 29.6 26.0 - 34.0 pg   MCHC 32.2 30.0 - 36.0 g/dL   RDW 13.6 11.5 - 15.5 %   Platelets 252 150 - 400 K/uL   nRBC 0.0 0.0 - 0.2 %   Neutrophils Relative % 82 %   Neutro Abs 6.2 1.7 - 7.7 K/uL   Lymphocytes Relative 13 %   Lymphs Abs 1.0 0.7 - 4.0 K/uL   Monocytes Relative 3 %   Monocytes Absolute 0.3 0.1 - 1.0 K/uL   Eosinophils Relative 1 %   Eosinophils Absolute 0.1 0.0 - 0.5 K/uL   Basophils Relative 1 %   Basophils Absolute 0.0 0.0 - 0.1 K/uL   Immature Granulocytes 0 %   Abs Immature Granulocytes 0.03 0.00 - 0.07 K/uL    Comment: Performed at [facility], [place] [address], [place], [place] [address]  Comprehensive metabolic panel     Status: Abnormal   Collection Time: 04/12/22 11:13 PM  Result Value Ref Range   Sodium 140 135 - 145 mmol/L   Potassium 4.3 3.5 - 5.1 mmol/L   Chloride 103 98 - 111 mmol/L   CO2 29 22 - 32 mmol/L   Glucose, Bld 121 (H) 70 - 99 mg/dL    Comment: Glucose reference range applies only to samples taken after fasting for at least 8 hours.   BUN 10 8 - 23 mg/dL   Creatinine, Ser 0.62 0.44 - 1.00 mg/dL   Calcium 9.4 8.9 - 10.3 mg/dL   Total Protein 7.3 6.5 - 8.1 g/dL   Albumin 3.9 3.5 - 5.0 g/dL   AST 26 15 - 41 U/L   ALT 15 0 - 44 U/L   Alkaline Phosphatase 102 38 - 126 U/L   Total Bilirubin 1.4 (H) 0.3 - 1.2 mg/dL   GFR, Estimated >60 >60 mL/min    Comment: (NOTE) Calculated using the CKD-EPI Creatinine Equation (2021)    Anion gap 8 5 -  15    Comment: Performed at [facility], [place] [address], [place], [place] [address]  Lipase, blood     Status: None    Collection Time: 04/12/22 11:13 PM  Result Value Ref Range   Lipase 22 11 - 51 U/L    Comment: Performed at [facility], [place] [address] [place], [place] [address]  Urinalysis, Routine w reflex microscopic     Status: Abnormal   Collection Time: 04/13/22  1:30 AM  Result Value Ref Range   Color, Urine YELLOW YELLOW   APPearance HAZY (A) CLEAR   Specific Gravity, Urine 1.044 (H) 1.005 - 1.030   pH 8.0 5.0 - 8.0   Glucose, UA NEGATIVE NEGATIVE mg/dL   Hgb urine dipstick SMALL (A) NEGATIVE   Bilirubin Urine NEGATIVE NEGATIVE   Ketones, ur 20 (A) NEGATIVE mg/dL   Protein, ur NEGATIVE NEGATIVE mg/dL   Nitrite NEGATIVE NEGATIVE   Leukocytes,Ua NEGATIVE NEGATIVE   RBC / HPF 21-50 0 - 5 RBC/hpf   WBC, UA 6-10 0 - 5 WBC/hpf   Bacteria, UA NONE SEEN NONE SEEN   Squamous Epithelial / LPF 0-5 0 - 5   Mucus PRESENT    Amorphous Crystal PRESENT     Comment: Performed at [facility], [place] [address] [place], [place] [address]   *Note: Due to a large number of results and/or encounters for the requested time period, some results have not been displayed. A complete set of results can be found in Results Review.   DG Abd Portable 1 View  Result Date: 04/13/2022 CLINICAL DATA:  Check gastric catheter placement EXAM: PORTABLE ABDOMEN - 1 VIEW COMPARISON:  None Available. FINDINGS: Gastric catheter extends into the stomach. No free air is seen. No obstructive changes are noted. IMPRESSION: Gastric catheter within the stomach. Electronically Signed   By: Inez Catalina M.D.   On: 04/13/2022 03:32   CT ABDOMEN PELVIS W CONTRAST  Result Date: 04/13/2022 CLINICAL DATA:  Left lower quadrant pain for 1 day, initial encounter EXAM: CT ABDOMEN AND PELVIS WITH CONTRAST TECHNIQUE: Multidetector CT imaging of the abdomen and pelvis was performed using the standard protocol following bolus administration of intravenous contrast. RADIATION DOSE REDUCTION: This exam was performed  according to the departmental dose-optimization program which includes automated exposure control, adjustment of the mA and/or kV according to patient size and/or use of iterative reconstruction technique. CONTRAST:  145m OMNIPAQUE IOHEXOL 300 MG/ML  SOLN COMPARISON:  01/11/2021 FINDINGS: Lower chest: No acute abnormality. Hepatobiliary: No focal liver abnormality is seen. No gallstones, gallbladder wall thickening, or biliary dilatation. Pancreas: Unremarkable. No pancreatic ductal dilatation or surrounding inflammatory changes. Spleen: Normal in size without focal abnormality. Adrenals/Urinary Tract: Adrenal glands are within normal limits. Nonobstructing right renal calculi are noted the largest of which measures 5 mm. No obstructive changes are seen. Left kidney demonstrates no renal calculi. No obstructive changes are seen. Bladder is partially distended. Stomach/Bowel: Postsurgical changes are noted in the sigmoid colon. More proximal colon shows no obstructive or inflammatory change. Scattered diverticula are noted without diverticulitis. The appendix is not well visualized. No inflammatory changes are identified to suggest appendicitis. The stomach is decompressed. Mild jejunal dilatation is noted with a transition zone identified along the anterior aspect of the abdominal wall likely related to adhesions. This is best seen on image number 52 of series 6. More distal small bowel is within normal limits. Vascular/Lymphatic: Aortic atherosclerosis. No enlarged abdominal or pelvic lymph nodes. Reproductive:  Status post hysterectomy. No adnexal masses. Other: Small fat containing ventral hernia is seen best noted on image number 53 of series 6 adjacent to the transition zone. Seconds abdominal wall defect is noted likely related to prior sigmoid colostomy. Musculoskeletal: No acute or significant osseous findings. IMPRESSION: Multiple dilated loops of jejunum with transition zone in the anterior abdomen likely  related to adhesions. This is consistent with a partial small bowel obstruction. Diverticulosis without diverticulitis. Anterior abdominal wall fat containing hernias as described. Nonobstructing right renal calculi. Electronically Signed   By: Inez Catalina M.D.   On: 04/13/2022 01:30      Assessment/Plan 71 yo female with small bowel obstruction, likely adhesive from prior abdominal surgeries. - the patient has been seen, examined, chart, labs, vitals, and imaging personally reviewed.  She does appear to have a SBO likely secondary to adhesive disease. - Remain NPO with NG tube to LIS - SBO protocol ordered - Nonoperative management for now, surgery will follow - Admitted to hospitalist service  FEN - NPO/NGT/IVFs VTE - ok for chemical prophylaxis from our standpoint ID - none currently needed  H/O diverticulitis s/p Hartmann's and colostomy reversal H/O Breast cancer Neuropathy secondary to chemo Trigeminal neuralgia    Summer [name], [facility] Surgery See Amion for on call pager numbers and who to contact.

## 2022-04-14 DIAGNOSIS — K56600 Partial intestinal obstruction, unspecified as to cause: Secondary | ICD-10-CM | POA: Diagnosis not present

## 2022-04-14 LAB — COMPREHENSIVE METABOLIC PANEL
ALT: 12 U/L (ref 0–44)
AST: 20 U/L (ref 15–41)
Albumin: 3.7 g/dL (ref 3.5–5.0)
Alkaline Phosphatase: 94 U/L (ref 38–126)
Anion gap: 10 (ref 5–15)
BUN: 9 mg/dL (ref 8–23)
CO2: 29 mmol/L (ref 22–32)
Calcium: 9 mg/dL (ref 8.9–10.3)
Chloride: 99 mmol/L (ref 98–111)
Creatinine, Ser: 0.56 mg/dL (ref 0.44–1.00)
GFR, Estimated: >60 mL/min (ref >60)
Glucose, Bld: 89 mg/dL (ref 70–99)
Potassium: 3.2 mmol/L — ABNORMAL LOW (ref 3.5–5.1)
Sodium: 138 mmol/L (ref 135–145)
Total Bilirubin: 1.5 mg/dL — ABNORMAL HIGH (ref 0.3–1.2)
Total Protein: 6.6 g/dL (ref 6.5–8.1)

## 2022-04-14 LAB — CBC
HCT: 41 % (ref 36.0–46.0)
Hemoglobin: 12.9 g/dL (ref 12.0–15.0)
MCH: 29.6 pg (ref 26.0–34.0)
MCHC: 31.5 g/dL (ref 30.0–36.0)
MCV: 94 fL (ref 80.0–100.0)
Platelets: 231 10*3/uL (ref 150–400)
RBC: 4.36 MIL/uL (ref 3.87–5.11)
RDW: 13.4 % (ref 11.5–15.5)
WBC: 6.3 10*3/uL (ref 4.0–10.5)
nRBC: 0 % (ref 0.0–0.2)

## 2022-04-14 MED ORDER — SIMETHICONE 40 MG/0.6ML PO SUSP: 80.0000 mg | Freq: Four times a day (QID) | ORAL | Status: DC | PRN

## 2022-04-14 MED ORDER — BACLOFEN 10 MG PO TABS
10.0000 mg | ORAL_TABLET | Freq: Two times a day (BID) | ORAL | Status: DC
  Administered 2022-04-15: 10 mg via ORAL
  Filled 2022-04-14: qty 1

## 2022-04-14 MED ORDER — KETOROLAC TROMETHAMINE 15 MG/ML IJ SOLN
7.5000 mg | Freq: Three times a day (TID) | INTRAMUSCULAR | Status: DC | PRN
Start: 2022-04-14 — End: 2022-04-15

## 2022-04-14 MED ORDER — ENSURE SURGERY PO LIQD
237.0000 mL | Freq: Two times a day (BID) | ORAL | Status: DC
  Administered 2022-04-14 – 2022-04-15 (×2): 237 mL via ORAL

## 2022-04-14 MED ORDER — MENTHOL 3 MG MT LOZG: 1.0000 | LOZENGE | OROMUCOSAL | Status: DC | PRN

## 2022-04-14 MED ORDER — LACTATED RINGERS IV BOLUS: 1000.0000 mL | Freq: Three times a day (TID) | INTRAVENOUS | Status: DC | PRN

## 2022-04-14 MED ORDER — SODIUM CHLORIDE 0.9 % IV SOLN: 250.0000 mL | INTRAVENOUS | Status: DC | PRN

## 2022-04-14 MED ORDER — CALCIUM POLYCARBOPHIL 625 MG PO TABS
625.0000 mg | ORAL_TABLET | Freq: Two times a day (BID) | ORAL | Status: DC
  Administered 2022-04-14 – 2022-04-15 (×3): 625 mg via ORAL
  Filled 2022-04-14 (×3): qty 1

## 2022-04-14 MED ORDER — DIPHENHYDRAMINE HCL 50 MG/ML IJ SOLN: 12.5000 mg | Freq: Four times a day (QID) | INTRAMUSCULAR | Status: DC | PRN

## 2022-04-14 MED ORDER — SODIUM CHLORIDE 0.9% FLUSH
3.0000 mL | Freq: Two times a day (BID) | INTRAVENOUS | Status: DC
  Administered 2022-04-14 – 2022-04-15 (×2): 3 mL via INTRAVENOUS

## 2022-04-14 MED ORDER — ALUM & MAG HYDROXIDE-SIMETH 200-200-20 MG/5ML PO SUSP: 30.0000 mL | Freq: Four times a day (QID) | ORAL | Status: DC | PRN

## 2022-04-14 MED ORDER — SODIUM CHLORIDE 0.9% FLUSH: 3.0000 mL | INTRAVENOUS | Status: DC | PRN

## 2022-04-14 MED ORDER — METHOCARBAMOL 1000 MG/10ML IJ SOLN: 1000.0000 mg | Freq: Four times a day (QID) | INTRAVENOUS | Status: DC | PRN

## 2022-04-14 MED ORDER — ACETAMINOPHEN 650 MG RE SUPP: 650.0000 mg | Freq: Four times a day (QID) | RECTAL | Status: DC | PRN

## 2022-04-14 MED ORDER — HYDROMORPHONE HCL 1 MG/ML IJ SOLN: 1.0000 mg | INTRAMUSCULAR | Status: DC | PRN

## 2022-04-14 MED ORDER — ACETAMINOPHEN 325 MG PO TABS
325.0000 mg | ORAL_TABLET | Freq: Four times a day (QID) | ORAL | Status: DC | PRN
  Administered 2022-04-14: 650 mg via ORAL
  Filled 2022-04-14: qty 2

## 2022-04-14 MED ORDER — DICLOFENAC SODIUM 1 % EX GEL
2.0000 g | Freq: Four times a day (QID) | CUTANEOUS | Status: DC
Start: 2022-04-14 — End: 2022-04-15
  Administered 2022-04-14 – 2022-04-15 (×3): 2 g via TOPICAL
  Filled 2022-04-14: qty 100

## 2022-04-14 MED ORDER — GABAPENTIN 300 MG PO CAPS
1200.0000 mg | ORAL_CAPSULE | Freq: Three times a day (TID) | ORAL | Status: DC
  Administered 2022-04-14 – 2022-04-15 (×3): 1200 mg via ORAL
  Filled 2022-04-14 (×3): qty 4

## 2022-04-14 MED ORDER — MAGIC MOUTHWASH: 15.0000 mL | Freq: Four times a day (QID) | ORAL | Status: DC | PRN

## 2022-04-14 MED ORDER — POTASSIUM CHLORIDE 10 MEQ/100ML IV SOLN
10.0000 meq | INTRAVENOUS | Status: AC
  Administered 2022-04-14 (×4): 10 meq via INTRAVENOUS
  Filled 2022-04-14 (×4): qty 100

## 2022-04-14 MED ORDER — DULOXETINE HCL 60 MG PO CPEP
60.0000 mg | ORAL_CAPSULE | Freq: Every day | ORAL | Status: DC
  Administered 2022-04-14: 60 mg via ORAL
  Filled 2022-04-14: qty 1

## 2022-04-14 MED ORDER — LIP MEDEX EX OINT
TOPICAL_OINTMENT | Freq: Two times a day (BID) | CUTANEOUS | Status: DC
  Administered 2022-04-14: 1 via TOPICAL
  Filled 2022-04-14: qty 7

## 2022-04-14 NOTE — Progress Notes (Signed)
PROGRESS NOTE    Summer [name]  MHD:[id number] DOB: March 15, 1951 DOA: 04/12/2022 PCP: Hoyt Koch, MD     Brief Narrative:   breast cancer s/p lumpectomy in 2020 on anastrozole, h/o  trigeminal neuralgia. Presenting with stomach pain, found to have pSBO  Subjective:  Still has Intermittent ab pain, not able to sleep due to pain, but overall ab pain is less then before Reports 0.'5mg'$  dilaudid did not touch the pain, but it made her feel nauseated  Would like to try something else for pain    Assessment & Plan:  Principal Problem:   Partial small bowel obstruction (HCC) Active Problems:   Trigeminal neuralgia of right side of face   Malignant neoplasm of upper-outer quadrant of right breast in female, estrogen receptor positive ([place])   Hyperglycemia   HLD (hyperlipidemia)    Assessment and Plan:  pSBO -Likely from adhesion, NG inserted on admission -Seen by surgery, started on small bowel protocol -Appreciate surgery input, follow-up recommendation  Hypokalemia Replace K, check mag  Hyperlipidemia Resume statin once able to take p.o.  Trigeminal neuralgia Resume Neurontin when able to take p.o.  History of breast cancer status postlumpectomy, resume anastrozole once able to take p.o.   Body mass index is 32.28 kg/m.Marland Kitchen  Meet obesity criteria Reports started semaglutide 2 months ago, stopped using a month ago Recommend against semaglutide in the setting of pSBO    I have Reviewed nursing notes, Vitals, pain scores, I/o's, Lab results and  imaging results since pt's last encounter, details please see discussion above  I ordered the following labs:  Unresulted Labs (From admission, onward)     Start     Ordered   04/15/22 2119  Basic metabolic panel  Tomorrow morning,   R        04/14/22 1612   04/15/22 0500  Magnesium  Tomorrow morning,   R        04/14/22 1612             DVT prophylaxis: SCDs Start: 04/13/22 0901   Code Status:    Code Status: Full Code  Family Communication: Patient Disposition:   Dispo: The patient is from: Home              Anticipated d/c is to: Home              Anticipated d/c date is: Possible tomorrow if cleared by general surgery  Antimicrobials:   Anti-infectives (From admission, onward)    None           Objective: Vitals:   04/13/22 2133 04/14/22 0107 04/14/22 0551 04/14/22 1356  BP: (!) 160/70 (!) 160/68 (!) 158/77 (!) 155/73  Pulse: 71 84 88 89  Resp: '18 18 18 18  '$ Temp: 98.5 F (36.9 C) 98.5 F (36.9 C) 97.9 F (36.6 C) 98.7 F (37.1 C)  TempSrc: Oral Oral Oral Oral  SpO2: 96% 98% 99% 99%  Weight:      Height:        Intake/Output Summary (Last 24 hours) at 04/14/2022 1612 Last data filed at 04/14/2022 1400 Gross per 24 hour  Intake 2072.35 ml  Output 350 ml  Net 1722.35 ml   Filed Weights   04/12/22 2258  Weight: 90.7 kg    Examination:  General exam: alert, awake, communicative,calm, NAD Respiratory system: Clear to auscultation. Respiratory effort normal. Cardiovascular system:  RRR.  Gastrointestinal system: Abdomen is slightly tender epigastric region , positive bowel sounds but sluggish  Central nervous system: Alert and oriented. No focal neurological deficits. Extremities:  no edema Skin: No rashes, lesions or ulcers Psychiatry: Judgement and insight appear normal. Mood & affect appropriate.     Data Reviewed: I have personally reviewed  labs and visualized  imaging studies since the last encounter and formulate the plan        Scheduled Meds:  [START ON 04/15/2022] baclofen  10 mg Oral BID   diclofenac Sodium  2 g Topical QID   DULoxetine  60 mg Oral QHS   feeding supplement  237 mL Oral BID BM   gabapentin  1,200 mg Oral TID   lip balm   Topical BID   polycarbophil  625 mg Oral BID   sodium chloride flush  3 mL Intravenous Q12H   Continuous Infusions:  sodium chloride     lactated ringers     lactated ringers 100 mL/hr at  04/14/22 0902   methocarbamol (ROBAXIN) IV       LOS: 1 day      Florencia Reasons, MD PhD FACP [facility]  Available via Epic secure chat 7am-7pm for nonurgent issues Please page for urgent issues To page the attending provider between 7A-7P or the covering provider during after hours 7P-7A, please log into the web site [email or web address] and access using universal  password for that web site. If you do not have the password, please call the hospital operator.    04/14/2022, 4:12 PM

## 2022-04-14 NOTE — Progress Notes (Signed)
Summer [name] [id number] Dec 09, 1950  CARE TEAM:  PCP: Hoyt Koch, MD  Outpatient Care Team: Patient Care Team: Hoyt Koch, MD as PCP - General (Internal Medicine) Sueanne Margarita, MD as PCP - Cardiology (Cardiology) Mauro Kaufmann, RN as Oncology Nurse Navigator Rockwell Germany, RN as Oncology Nurse Navigator Excell Seltzer, MD (Inactive) as Consulting Physician (General Surgery) Truitt Merle, MD as Consulting Physician (Hematology) Gery Pray, MD as Consulting Physician (Radiation Oncology) Alla Feeling, NP as Nurse Practitioner (Nurse Practitioner)  Inpatient Treatment Team: Treatment Team: Attending Provider: Florencia Reasons, MD; Consulting Physician: Edison Pace, Md, MD; Rounding Team: Fatima Blank, MD; Utilization Review: Alease Medina, RN; Registered Nurse: Tanda Rockers, RN; Social Worker: Nada Libman; Pharmacist: Emiliano Dyer, [facility]   Problem List:   Principal Problem:   Partial small bowel obstruction [facility]) Active Problems:   Trigeminal neuralgia of right side of face   Malignant neoplasm of upper-outer quadrant of right breast in female, estrogen receptor positive ([place])   Hyperglycemia   HLD (hyperlipidemia)      * No surgery found *      Assessment  Bowel obstruction appears to be resolving quickly  [facility] Stay = 1 days)  Plan:  -Given the fact that she had flatus, bowel movements and contrast in the colon; I removed the NG tube. -Clear liquids.  Advance to full/pured as tolerated. -If continues to improve rapidly, consider a solid diet in the morning and discharge tomorrow. Neuropathy, neuralgia, high lipidemia, other health issues per admitting service. -VTE prophylaxis- SCDs, etc -mobilize as tolerated to help recovery  Disposition: Per primary service.  Hopefully home as soon as tomorrow if continues to rapidly improve    I reviewed hospitalist notes, last 24 h vitals and pain  scores, last 48 h intake and output, last 24 h labs and trends, and last 24 h imaging results. I have reviewed this patient's available data, including medical history, events of note, test results, etc as part of my evaluation.  A significant portion of that time was spent in counseling.  Care during the described time interval was provided by me.  This care required straight-forward level of medical decision making.  04/14/2022    Subjective: (Chief complaint)  Patient had flatus and diarrhea.  Abdominal pain much less.  Objective:  Vital signs:  Vitals:   04/13/22 1758 04/13/22 2133 04/14/22 0107 04/14/22 0551  BP: (!) 132/59 (!) 160/70 (!) 160/68 (!) 158/77  Pulse: 71 71 84 88  Resp: '18 18 18 18  '$ Temp: 98.6 F (37 C) 98.5 F (36.9 C) 98.5 F (36.9 C) 97.9 F (36.6 C)  TempSrc: Oral Oral Oral Oral  SpO2: 98% 96% 98% 99%  Weight:      Height:        Last BM Date : 04/14/22  Intake/Output   Yesterday:  08/04 0701 - 08/05 0700 In: 1972.4 [I.V.:1972.4] Out: 475 [Urine:100; Emesis/NG output:375] This shift:  No intake/output data recorded.  Bowel function:  Flatus: YES  BM:  YES  Drain: Bilious from NGT   Physical Exam:  General: Pt awake/alert in no acute distress Eyes: PERRL, normal EOM.  Sclera clear.  No icterus Neuro: CN II-XII intact w/o focal sensory/motor deficits. Lymph: No head/neck/groin lymphadenopathy Psych:  No delerium/psychosis/paranoia.  Oriented x 4 HENT: Normocephalic, Mucus membranes moist.  No thrush Neck: Supple, No tracheal deviation.  No obvious thyromegaly Chest: No pain to chest wall compression.  Good respiratory excursion.  No audible wheezing CV:  Pulses intact.  Regular rhythm.  No major extremity edema MS: Normal AROM mjr joints.  No obvious deformity  Abdomen: Soft.  Nondistended.  Mildly tender at incisions only.  No evidence of peritonitis.  No incarcerated hernias.  Ext:  No deformity.  No mjr edema.  No cyanosis Skin:  No petechiae / purpurea.  No major sores.  Warm and dry    Results:   Cultures: No results found for this or any previous visit (from the past 720 hour(s)).  Labs: Results for orders placed or performed during the hospital encounter of 04/12/22 (from the past 48 hour(s))  CBC with Differential     Status: None   Collection Time: 04/12/22 11:13 PM  Result Value Ref Range   WBC 7.7 4.0 - 10.5 K/uL   RBC 4.93 3.87 - 5.11 MIL/uL   Hemoglobin 14.6 12.0 - 15.0 g/dL   HCT 45.3 36.0 - 46.0 %   MCV 91.9 80.0 - 100.0 fL   MCH 29.6 26.0 - 34.0 pg   MCHC 32.2 30.0 - 36.0 g/dL   RDW 13.6 11.5 - 15.5 %   Platelets 252 150 - 400 K/uL   nRBC 0.0 0.0 - 0.2 %   Neutrophils Relative % 82 %   Neutro Abs 6.2 1.7 - 7.7 K/uL   Lymphocytes Relative 13 %   Lymphs Abs 1.0 0.7 - 4.0 K/uL   Monocytes Relative 3 %   Monocytes Absolute 0.3 0.1 - 1.0 K/uL   Eosinophils Relative 1 %   Eosinophils Absolute 0.1 0.0 - 0.5 K/uL   Basophils Relative 1 %   Basophils Absolute 0.0 0.0 - 0.1 K/uL   Immature Granulocytes 0 %   Abs Immature Granulocytes 0.03 0.00 - 0.07 K/uL    Comment: Performed at [facility], [place] [address] [place], [place] [address]  Comprehensive metabolic panel     Status: Abnormal   Collection Time: 04/12/22 11:13 PM  Result Value Ref Range   Sodium 140 135 - 145 mmol/L   Potassium 4.3 3.5 - 5.1 mmol/L   Chloride 103 98 - 111 mmol/L   CO2 29 22 - 32 mmol/L   Glucose, Bld 121 (H) 70 - 99 mg/dL    Comment: Glucose reference range applies only to samples taken after fasting for at least 8 hours.   BUN 10 8 - 23 mg/dL   Creatinine, Ser 0.62 0.44 - 1.00 mg/dL   Calcium 9.4 8.9 - 10.3 mg/dL   Total Protein 7.3 6.5 - 8.1 g/dL   Albumin 3.9 3.5 - 5.0 g/dL   AST 26 15 - 41 U/L   ALT 15 0 - 44 U/L   Alkaline Phosphatase 102 38 - 126 U/L   Total Bilirubin 1.4 (H) 0.3 - 1.2 mg/dL   GFR, Estimated >60 >60 mL/min    Comment: (NOTE) Calculated using the CKD-EPI Creatinine  Equation (2021)    Anion gap 8 5 - 15    Comment: Performed at [facility], [place] [address] [place], [place] [address]  Lipase, blood     Status: None   Collection Time: 04/12/22 11:13 PM  Result Value Ref Range   Lipase 22 11 - 51 U/L    Comment: Performed at [facility], [place] [address], [place], [place] [address]  Urinalysis, Routine w reflex microscopic     Status: Abnormal   Collection Time: 04/13/22  1:30 AM  Result Value Ref Range  Color, Urine YELLOW YELLOW   APPearance HAZY (A) CLEAR   Specific Gravity, Urine 1.044 (H) 1.005 - 1.030   pH 8.0 5.0 - 8.0   Glucose, UA NEGATIVE NEGATIVE mg/dL   Hgb urine dipstick SMALL (A) NEGATIVE   Bilirubin Urine NEGATIVE NEGATIVE   Ketones, ur 20 (A) NEGATIVE mg/dL   Protein, ur NEGATIVE NEGATIVE mg/dL   Nitrite NEGATIVE NEGATIVE   Leukocytes,Ua NEGATIVE NEGATIVE   RBC / HPF 21-50 0 - 5 RBC/hpf   WBC, UA 6-10 0 - 5 WBC/hpf   Bacteria, UA NONE SEEN NONE SEEN   Squamous Epithelial / LPF 0-5 0 - 5   Mucus PRESENT    Amorphous Crystal PRESENT     Comment: Performed at [facility], [place] [address], [place], [place] [address]  HIV Antibody (routine testing w rflx)     Status: None   Collection Time: 04/13/22  9:14 AM  Result Value Ref Range   HIV Screen 4th Generation wRfx Non Reactive Non Reactive    Comment: Performed at [facility], [place] [address], [place], [place] [address]  Hemoglobin A1c     Status: None   Collection Time: 04/13/22  9:14 AM  Result Value Ref Range   Hgb A1c MFr Bld 5.3 4.8 - 5.6 %    Comment: (NOTE) Pre diabetes:          5.7%-6.4%  Diabetes:              >6.4%  Glycemic control for   <7.0% adults with diabetes    Mean Plasma Glucose 105.41 mg/dL    Comment: Performed at [place] [address] [place], [place] [address]  Comprehensive metabolic panel     Status: Abnormal   Collection Time: 04/14/22  4:32 AM  Result Value Ref  Range   Sodium 138 135 - 145 mmol/L   Potassium 3.2 (L) 3.5 - 5.1 mmol/L    Comment: DELTA CHECK NOTED   Chloride 99 98 - 111 mmol/L   CO2 29 22 - 32 mmol/L   Glucose, Bld 89 70 - 99 mg/dL    Comment: Glucose reference range applies only to samples taken after fasting for at least 8 hours.   BUN 9 8 - 23 mg/dL   Creatinine, Ser 0.56 0.44 - 1.00 mg/dL   Calcium 9.0 8.9 - 10.3 mg/dL   Total Protein 6.6 6.5 - 8.1 g/dL   Albumin 3.7 3.5 - 5.0 g/dL   AST 20 15 - 41 U/L   ALT 12 0 - 44 U/L   Alkaline Phosphatase 94 38 - 126 U/L   Total Bilirubin 1.5 (H) 0.3 - 1.2 mg/dL   GFR, Estimated >60 >60 mL/min    Comment: (NOTE) Calculated using the CKD-EPI Creatinine Equation (2021)    Anion gap 10 5 - 15    Comment: Performed at [facility], [place] [address], , [place] [address]  CBC     Status: None   Collection Time: 04/14/22  4:32 AM  Result Value Ref Range   WBC 6.3 4.0 - 10.5 K/uL   RBC 4.36 3.87 - 5.11 MIL/uL   Hemoglobin 12.9 12.0 - 15.0 g/dL   HCT 41.0 36.0 - 46.0 %   MCV 94.0 80.0 - 100.0 fL   MCH 29.6 26.0 - 34.0 pg   MCHC 31.5 30.0 - 36.0 g/dL   RDW 13.4 11.5 - 15.5 %   Platelets 231 150 - 400 K/uL  nRBC 0.0 0.0 - 0.2 %    Comment: Performed at [facility], [place] [address] [place], [place] [address]   *Note: Due to a large number of results and/or encounters for the requested time period, some results have not been displayed. A complete set of results can be found in Results Review.    Imaging / Studies: DG Abd Portable 1V-Small Bowel Obstruction Protocol-initial, 8 hr delay  Result Date: 04/13/2022 CLINICAL DATA:  Left lower quadrant pain, suspected partial small-bowel obstruction EXAM: PORTABLE ABDOMEN - 1 VIEW COMPARISON:  04/13/2022 FINDINGS: Supine frontal view of the abdomen and pelvis demonstrates enteric catheter tip projecting over the gastric antrum. Oral contrast administered 8 hours previously has progressed into the  colon by the time of the exam. No evidence of high-grade bowel obstruction on this study. Minimally distended gas-filled loops of jejunum within the left lower quadrant measure up to 2.7 cm in diameter, nonspecific. No abdominal masses or abnormal calcifications. No acute bony abnormalities. IMPRESSION: 1. No evidence of bowel obstruction, with passage of oral contrast throughout the colon by the time of imaging. 2. Mild nonspecific dilation of a single loop of jejunum in the left lower quadrant, with decreased prominence since prior CT. Electronically Signed   By: Randa Ngo M.D.   On: 04/13/2022 16:09   DG Abd Portable 1 View  Result Date: 04/13/2022 CLINICAL DATA:  Check gastric catheter placement EXAM: PORTABLE ABDOMEN - 1 VIEW COMPARISON:  None Available. FINDINGS: Gastric catheter extends into the stomach. No free air is seen. No obstructive changes are noted. IMPRESSION: Gastric catheter within the stomach. Electronically Signed   By: Inez Catalina M.D.   On: 04/13/2022 03:32   CT ABDOMEN PELVIS W CONTRAST  Result Date: 04/13/2022 CLINICAL DATA:  Left lower quadrant pain for 1 day, initial encounter EXAM: CT ABDOMEN AND PELVIS WITH CONTRAST TECHNIQUE: Multidetector CT imaging of the abdomen and pelvis was performed using the standard protocol following bolus administration of intravenous contrast. RADIATION DOSE REDUCTION: This exam was performed according to the departmental dose-optimization program which includes automated exposure control, adjustment of the mA and/or kV according to patient size and/or use of iterative reconstruction technique. CONTRAST:  155m OMNIPAQUE IOHEXOL 300 MG/ML  SOLN COMPARISON:  01/11/2021 FINDINGS: Lower chest: No acute abnormality. Hepatobiliary: No focal liver abnormality is seen. No gallstones, gallbladder wall thickening, or biliary dilatation. Pancreas: Unremarkable. No pancreatic ductal dilatation or surrounding inflammatory changes. Spleen: Normal in size  without focal abnormality. Adrenals/Urinary Tract: Adrenal glands are within normal limits. Nonobstructing right renal calculi are noted the largest of which measures 5 mm. No obstructive changes are seen. Left kidney demonstrates no renal calculi. No obstructive changes are seen. Bladder is partially distended. Stomach/Bowel: Postsurgical changes are noted in the sigmoid colon. More proximal colon shows no obstructive or inflammatory change. Scattered diverticula are noted without diverticulitis. The appendix is not well visualized. No inflammatory changes are identified to suggest appendicitis. The stomach is decompressed. Mild jejunal dilatation is noted with a transition zone identified along the anterior aspect of the abdominal wall likely related to adhesions. This is best seen on image number 52 of series 6. More distal small bowel is within normal limits. Vascular/Lymphatic: Aortic atherosclerosis. No enlarged abdominal or pelvic lymph nodes. Reproductive: Status post hysterectomy. No adnexal masses. Other: Small fat containing ventral hernia is seen best noted on image number 53 of series 6 adjacent to the transition zone. Seconds abdominal wall defect is noted likely related to prior  sigmoid colostomy. Musculoskeletal: No acute or significant osseous findings. IMPRESSION: Multiple dilated loops of jejunum with transition zone in the anterior abdomen likely related to adhesions. This is consistent with a partial small bowel obstruction. Diverticulosis without diverticulitis. Anterior abdominal wall fat containing hernias as described. Nonobstructing right renal calculi. Electronically Signed   By: Inez Catalina M.D.   On: 04/13/2022 01:30    Medications / Allergies: per chart  Antibiotics: Anti-infectives (From admission, onward)    None         Note: Portions of this report may have been transcribed using voice recognition software. Every effort was made to ensure accuracy; however,  inadvertent computerized transcription errors may be present.   Any transcriptional errors that result from this process are unintentional.    Adin Hector, MD, FACS, MASCRS Esophageal, Gastrointestinal & Colorectal Surgery Robotic and Minimally Invasive Surgery  [facility] [address], [place],  [address] [phone] Fax [phone] Main  CONTACT INFORMATION:  Weekday (9AM-5PM): Call [facility] main office at [phone]  Weeknight (5PM-9AM) or Weekend/Holiday: Check [email or web address] (password " [email or web address]") for General Surgery [facility] coverage  (Please, do not use SecureChat as it is not reliable communication to reach operating surgeons for immediate patient care)      04/14/2022  10:43 AM

## 2022-04-15 DIAGNOSIS — K56600 Partial intestinal obstruction, unspecified as to cause: Secondary | ICD-10-CM | POA: Diagnosis not present

## 2022-04-15 LAB — BASIC METABOLIC PANEL
Anion gap: 6 (ref 5–15)
BUN: 7 mg/dL — ABNORMAL LOW (ref 8–23)
CO2: 27 mmol/L (ref 22–32)
Calcium: 9 mg/dL (ref 8.9–10.3)
Chloride: 106 mmol/L (ref 98–111)
Creatinine, Ser: 0.64 mg/dL (ref 0.44–1.00)
GFR, Estimated: >60 mL/min (ref >60)
Glucose, Bld: 90 mg/dL (ref 70–99)
Potassium: 3.3 mmol/L — ABNORMAL LOW (ref 3.5–5.1)
Sodium: 139 mmol/L (ref 135–145)

## 2022-04-15 LAB — MAGNESIUM: Magnesium: 1.8 mg/dL (ref 1.7–2.4)

## 2022-04-15 MED ORDER — SODIUM CHLORIDE 0.9% FLUSH: 3.0000 mL | Freq: Two times a day (BID) | INTRAVENOUS | Status: DC

## 2022-04-15 MED ORDER — SODIUM CHLORIDE 0.9% FLUSH: 3.0000 mL | INTRAVENOUS | Status: DC | PRN

## 2022-04-15 MED ORDER — SODIUM CHLORIDE 0.9 % IV SOLN: 250.0000 mL | INTRAVENOUS | Status: DC | PRN

## 2022-04-15 MED ORDER — POTASSIUM CHLORIDE 20 MEQ PO PACK
40.0000 meq | PACK | Freq: Once | ORAL | Status: AC
  Administered 2022-04-15: 40 meq via ORAL
  Filled 2022-04-15: qty 2

## 2022-04-15 MED ORDER — MELATONIN 3 MG PO CAPS: 3.0000 mg | ORAL_CAPSULE | Freq: Every day | ORAL | 0 refills | Status: DC

## 2022-04-15 NOTE — Discharge Instructions (Signed)
EATING AFTER A SMALL BOWEL OBSTRUCTION   EAT START WITH PUREED OR SOFT FOODS Gradually transition to a high fiber diet with a fiber supplement over the next few days after discharge  WALK Walk an hour a day.  Control your pain to do that.    CONTROL PAIN Control pain so that you can walk, sleep, tolerate sneezing/coughing, go up/down stairs.  HAVE A BOWEL MOVEMENT DAILY Keep your bowels regular to avoid problems.  OK to try a laxative to override constipation.  OK to use an antidairrheal to slow down diarrhea.  Call if not better after 2 tries  CALL IF YOU HAVE PROBLEMS/CONCERNS Call if you are still struggling despite following these instructions. Call if you have concerns not answered by these instructions     After your attack of SMALL BOWEL OBSTRUCTION, expect some issues over the next few weeks.    To help you through this temporary phase, we start you out on a pureed (blenderized) diet.  Your first meal in the hospital was thin liquids.  You should have been given a pureed diet by the time you left the hospital.  We ask patients to stay on a pureed diet for the first few days to avoid anything getting "stuck."  Don't be alarmed if your ability to swallow doesn't progress according to this plan.  Everyone is different and some diets can advance more or less quickly.     Some BASIC RULES to follow are: Maintain an upright position whenever eating or drinking. Take small bites - just a teaspoon size bite at a time. Eat slowly.  It may also help to eat only one food at a time. Consider nibbling through smaller, more frequent meals & avoid the urge to eat BIG meals Do not push through feelings of fullness, nausea, or bloatedness Do not mix solid foods and liquids in the same mouthful Try not to "wash foods down" with large gulps of liquids. Avoid carbonated (bubbly/fizzy) drinks.   Avoid foods that make you feel gassy or bloated.  Start with bland foods first.  Wait on trying  greasy, fried, or spicy meals until you are tolerating more bland solids well. Expect to be more gassy/flatulent/bloated initially.  Walking will help your body manage it better. Consider using medications for bloating that contain simethicone such as  Maalox or Gas-X  Eat in a relaxed atmosphere & minimize distractions. Avoid talking while eating.   Do not use straws. Following each meal, sit in an upright position (90 degree angle) for 60 to 90 minutes.  Going for a short walk can help as well If food does stick, don't panic.  Try to relax and let the food pass on its own.  Sipping WARM LIQUID such as strong hot black tea can also help slide it down.   Be gradual in changes & use common sense:  -If you easily tolerating a certain "level" of foods, advance to the next level gradually -If you are having trouble swallowing a particular food, then avoid it.   -If food is sticking when you advance your diet, go back to thinner previous diet (the lower LEVEL) for 1-2 days.  LEVEL 1 = PUREED DIET  Do for the first Riley in this group are pureed or blenderized to a smooth, mashed potato-like consistency.  -If necessary, the pureed foods can keep their shape with the addition of a thickening agent.   -Meat should be pureed to a  smooth, pasty consistency.  Hot broth or gravy may be added to the pureed meat, approximately 1 oz. of liquid per 3 oz. serving of meat. -CAUTION:  If any foods do not puree into a smooth consistency, swallowing will be more difficult.  (For example, nuts or seeds sometimes do not blend well.)  Hot Foods Cold Foods  Pureed scrambled eggs and cheese Pureed cottage cheese  Baby cereals Thickened juices and nectars  Thinned cooked cereals (no lumps) Thickened milk or eggnog  Pureed [place] toast or pancakes Ensure  Mashed potatoes Ice cream  Pureed parsley, au gratin, scalloped potatoes, candied sweet potatoes Fruit or [place] ice,  sherbet  Pureed buttered or alfredo noodles Plain yogurt  Pureed vegetables (no corn or peas) Instant breakfast  Pureed soups and creamed soups Smooth pudding, mousse, custard  Pureed scalloped apples Whipped gelatin  Gravies Sugar, syrup, honey, jelly  Sauces, cheese, tomato, barbecue, white, creamed Cream  Any baby food Creamer  Alcohol in moderation (not beer or champagne) Margarine  Coffee or tea Mayonnaise   Ketchup, mustard   Apple sauce   SAMPLE MENU:  PUREED DIET Breakfast Lunch Dinner  Orange juice, 1/2 cup Cream of wheat, 1/2 cup Pineapple juice, 1/2 cup Pureed [place], barley soup, 3/4 cup Pureed Hawaiian chicken, 3 oz  Scrambled eggs, mashed or blended with cheese, 1/2 cup Tea or coffee, 1 cup  Whole milk, 1 cup  Non-dairy creamer, 2 Tbsp. Mashed potatoes, 1/2 cup Pureed cooled broccoli, 1/2 cup Apple sauce, 1/2 cup Coffee or tea Mashed potatoes, 1/2 cup Pureed spinach, 1/2 cup Frozen yogurt, 1/2 cup Tea or coffee      LEVEL 2 = SOFT DIET  After your first few days, you can advance to a soft, low residue diet.   Keep on this diet until everything goes down easily.  Hot Foods Cold Foods  White fish Cottage cheese  Stuffed fish Junior baby fruit  Baby food meals Semi thickened juices  Minced soft cooked, scrambled, poached eggs nectars  Souffle & omelets Ripe mashed bananas  Cooked cereals Canned fruit, pineapple sauce, milk  potatoes Milkshake  Buttered or Alfredo noodles Custard  Cooked cooled vegetable Puddings, including tapioca  Sherbet Yogurt  Vegetable soup or alphabet soup Fruit ice, [place] ice  Gravies Whipped gelatin  Sugar, syrup, honey, jelly Junior baby desserts  Sauces:  Cheese, creamed, barbecue, tomato, white Cream  Coffee or tea Margarine   SAMPLE MENU:  LEVEL 2 Breakfast Lunch Dinner  Orange juice, 1/2 cup Oatmeal, 1/2 cup Scrambled eggs with cheese, 1/2 cup Decaffeinated tea, 1 cup Whole milk, 1 cup Non-dairy creamer, 2 Tbsp  Pineapple juice, 1/2 cup Minced beef, 3 oz Gravy, 2 Tbsp Mashed potatoes, 1/2 cup Minced fresh broccoli, 1/2 cup Applesauce, 1/2 cup Coffee, 1 cup [place], barley soup, 3/4 cup Minced Hawaiian chicken, 3 oz Mashed potatoes, 1/2 cup Cooked spinach, 1/2 cup Frozen yogurt, 1/2 cup Non-dairy creamer, 2 Tbsp      LEVEL 3 = CHOPPED DIET  -After all the foods in level 2 (soft diet) are passing through well you should advance up to more chopped foods.  -It is still important to cut these foods into small pieces and eat slowly.  Hot Foods Cold Foods  Poultry Cottage cheese  Chopped Swedish meatballs Yogurt  Meat salads (ground or flaked meat) Milk  Flaked fish (tuna) Milkshakes  Poached or scrambled eggs Soft, cold, dry cereal  Souffles and omelets Fruit juices or nectars  Cooked cereals Chopped canned  fruit  Chopped [place] toast or pancakes Canned fruit cocktail  Noodles or pasta (no rice) Pudding, mousse, custard  Cooked vegetables (no frozen peas, corn, or mixed vegetables) Green salad  Canned small sweet peas Ice cream  Creamed soup or vegetable soup Fruit ice, [place] ice  Pureed vegetable soup or alphabet soup Non-dairy creamer  Ground scalloped apples Margarine  Gravies Mayonnaise  Sauces:  Cheese, creamed, barbecue, tomato, white Ketchup  Coffee or tea Mustard   SAMPLE MENU:  LEVEL 3 Breakfast Lunch Dinner  Orange juice, 1/2 cup Oatmeal, 1/2 cup Scrambled eggs with cheese, 1/2 cup Decaffeinated tea, 1 cup Whole milk, 1 cup Non-dairy creamer, 2 Tbsp Ketchup, 1 Tbsp Margarine, 1 tsp Salt, 1/4 tsp Sugar, 2 tsp Pineapple juice, 1/2 cup Ground beef, 3 oz Gravy, 2 Tbsp Mashed potatoes, 1/2 cup Cooked spinach, 1/2 cup Applesauce, 1/2 cup Decaffeinated coffee Whole milk Non-dairy creamer, 2 Tbsp Margarine, 1 tsp Salt, 1/4 tsp Pureed [place], barley soup, 3/4 cup Barbecue chicken, 3 oz Mashed potatoes, 1/2 cup Ground fresh broccoli, 1/2 cup Frozen yogurt, 1/2  cup Decaffeinated tea, 1 cup Non-dairy creamer, 2 Tbsp Margarine, 1 tsp Salt, 1/4 tsp Sugar, 1 tsp    LEVEL 4:  HIGH FIBER DIET / REGULAR FOODS  -Foods in this group are soft, moist, regularly textured foods.   -This level includes meat and breads, which tend to be the hardest things to swallow.   -Eat very slowly, chew well and continue to avoid carbonated drinks. -most people are at this level in 2-4 weeks  Hot Foods Cold Foods  Baked fish or skinned Soft cheeses - cottage cheese  Souffles and omelets Cream cheese  Eggs Yogurt  Stuffed shells Milk  Spaghetti with meat sauce Milkshakes  Cooked cereal Cold dry cereals (no nuts, dried fruit, coconut)  [place] toast or pancakes Crackers  Buttered toast Fruit juices or nectars  Noodles or pasta (no rice) Canned fruit  Potatoes (all types) Ripe bananas  Soft, cooked vegetables (no corn, lima, or baked beans) Peeled, ripe, fresh fruit  Creamed soups or vegetable soup Cakes (no nuts, dried fruit, coconut)  Canned chicken noodle soup Plain doughnuts  Gravies Ice cream  Bacon dressing Pudding, mousse, custard  Sauces:  Cheese, creamed, barbecue, tomato, white Fruit ice, [place] ice, sherbet  Decaffeinated tea or coffee Whipped gelatin  Pork chops Regular gelatin   Canned fruited gelatin molds   Sugar, syrup, honey, jam, jelly   Cream   Non-dairy   Margarine   Oil   Mayonnaise   Ketchup   Mustard   TROUBLESHOOTING IRREGULAR BOWELS  1) Avoid extremes of bowel movements (no bad constipation/diarrhea)  2) Miralax 17gm mixed in 8oz. water or juice-daily. May use BID as needed.  3) Gas-x,Phazyme, etc. as needed for gas & bloating.  4) Soft,bland diet. No spicy,greasy,fried foods.  5) Prilosec over-the-counter as needed  6) May hold gluten/wheat products from diet to see if symptoms improve.  7) May try probiotics (Align, Activa, etc) to help calm the bowels down  7) If symptoms become worse call back immediately.    If you  have any questions please call our office at [place]: [phone].     This information is not intended to replace advice given to you by your health care provider. Make sure you discuss any questions you have with your health care provider.      Bowel Obstruction A bowel obstruction is a blockage in the small or large bowel. The  bowel, which is also called the intestine, is a long, slender tube that connects the stomach to the anus. When a person eats and drinks, food and fluids go from the mouth to the stomach to the small bowel. This is where most of the nutrients in the food and fluids are absorbed. After the small bowel, material passes through the large bowel for further absorption until any leftover material leaves the body as stool through the anus during a bowel movement. A bowel obstruction will prevent food and fluids from passing through the bowel as they normally do during digestion. The bowel can become partially or completely blocked. If this condition is not treated, it can be dangerous because the bowel could rupture. What are the causes? Common causes of this condition include: Scar tissue (adhesions) from previous surgery or treatment with high-energy X-rays (radiation). Recent surgery. This may cause the movements of the bowel to slow down and cause food to block the intestine. Inflammatory bowel disease, such as Crohn's disease or diverticulitis. Growths or tumors. A bulging organ (hernia). Twisting of the bowel (volvulus). A foreign body. Slipping of a part of the bowel into another part (intussusception). What are the signs or symptoms? Symptoms of this condition include: Pain in the abdomen. Depending on the degree of obstruction, pain may be: Mild or severe. Dull cramping or sharp pain. In one area or in the entire abdomen. Nausea and vomiting. Vomit may be greenish or a yellow bile color. Bloating in the abdomen. Difficulty passing stool  (constipation). Lack of passing gas. Frequent belching. Diarrhea. This may occur if the obstruction is partial and runny stool is able to leak around the obstruction. How is this diagnosed? This condition may be diagnosed based on: A physical exam. Medical history. Imaging tests of the abdomen or pelvis, such as X-ray or CT scan. Blood or urine tests. How is this treated? Treatment for this condition depends on the cause and severity of the problem. Treatment may include: Fluids and pain medicines that are given through an IV. Your health care provider may instruct you not to eat or drink if you have nausea or vomiting. Eating a simple diet. You may be asked to consume a clear liquid diet for several days. This allows the bowel to rest. Placement of a small tube (nasogastric tube) into the stomach. This will relieve pain, discomfort, and nausea by removing blocked air and fluids from the stomach. It can also help the obstruction clear up faster. Surgery. This may be required if other treatments do not work. Surgery may be required for: Bowel obstruction from a hernia. This can be an emergency procedure. Scar tissue that causes frequent or severe obstructions. Follow these instructions at home: Medicines Take over-the-counter and prescription medicines only as told by your health care provider. If you were prescribed an antibiotic medicine, take it as told by your health care provider. Do not stop taking the antibiotic even if you start to feel better. General instructions Follow instructions from your health care provider about eating restrictions. You may need to avoid solid foods and consume only clear liquids until your condition improves. Return to your normal activities as told by your health care provider. Ask your health care provider what activities are safe for you. Avoid sitting for a long time without moving. Get up to take short walks every 1-2 hours. This is important to improve  blood flow and breathing. Ask for help if you feel weak or unsteady. Keep all follow-up visits  as told by your health care provider. This is important. How is this prevented? After having a bowel obstruction, you are more likely to have another. You may do the following things to prevent another obstruction: If you have a long-term (chronic) disease, pay attention to your symptoms and contact your health care provider if you have questions or concerns. Avoid becoming constipated. To prevent or treat constipation, your health care provider may recommend that you: Drink enough fluid to keep your urine pale yellow. Take over-the-counter or prescription medicines. Eat foods that are high in fiber, such as beans, whole grains, and fresh fruits and vegetables. Limit foods that are high in fat and processed sugars, such as fried or sweet foods. Stay active. Exercise for 30 minutes or more, 5 or more days each week. Ask your health care provider which exercises are safe for you. Avoid stress. Find ways to reduce stress, such as meditation, exercise, or taking time for activities that relax you. Instead of eating three large meals each day, eat three small meals with three small snacks. Work with a dietitian to make a healthy meal plan that works for you. Do not use any products that contain nicotine or tobacco, such as cigarettes and e-cigarettes. If you need help quitting, ask your health care provider. Contact a health care provider if you: Have a fever. Have chills. Get help right away if you: Have increased pain or cramping. Vomit blood. Have uncontrolled vomiting or nausea. Cannot drink fluids because of vomiting or pain. Become confused. Begin feeling very thirsty (dehydrated). Have severe bloating. Feel extremely weak or you faint. Summary A bowel obstruction is a blockage in the small or large bowel. A bowel obstruction will prevent food and fluids from passing through the bowel as they  normally do during digestion. Treatment for this condition depends on the cause and severity of the problem. It may include fluids and pain medicines through an IV, a simple diet, a nasogastric tube, or surgery. Follow instructions from your health care provider about eating restrictions. You may need to avoid solid foods and consume only clear liquids until your condition improves.   

## 2022-04-15 NOTE — Progress Notes (Signed)
Summer [name] [id number] [birth year]  CARE TEAM:  PCP: Hoyt Koch, MD  Outpatient Care Team: Patient Care Team: Hoyt Koch, MD as PCP - General (Internal Medicine) Sueanne Margarita, MD as PCP - Cardiology (Cardiology) Mauro Kaufmann, RN as Oncology Nurse Navigator Rockwell Germany, RN as Oncology Nurse Navigator Excell Seltzer, MD (Inactive) as Consulting Physician (General Surgery) Truitt Merle, MD as Consulting Physician (Hematology) Gery Pray, MD as Consulting Physician (Radiation Oncology) Alla Feeling, NP as Nurse Practitioner (Nurse Practitioner)  Inpatient Treatment Team: Treatment Team: Attending Provider: Florencia Reasons, MD; Consulting Physician: Edison Pace, Md, MD; Rounding Team: Fatima Blank, MD; Technician: Constance Goltz, NT; Registered Nurse: Alva Garnet, RN; Utilization Review: Alease Medina, RN; Pharmacist: Emiliano Dyer, [facility]   Problem List:   Principal Problem:   Partial small bowel obstruction [facility]) Active Problems:   Trigeminal neuralgia of right side of face   Malignant neoplasm of upper-outer quadrant of right breast in female, estrogen receptor positive ([place])   Hyperglycemia   HLD (hyperlipidemia)      * No surgery found *      Assessment  Bowel obstruction appears to be resolving quickly  [facility] Stay = 2 days)  Plan: -Okay to advance to soft diet.  If tolerates this morning most likely can go home later today.  Defer to primary service but I suspect they will agree as well  Neuropathy, neuralgia, high lipidemia, other health issues per admitting service.  -VTE prophylaxis- SCDs, etc  -mobilize as tolerated to help recovery  Disposition: Per primary service.  Hopefully home as soon as tomorrow if continues to rapidly improve    I reviewed hospitalist notes, last 24 h vitals and pain scores, last 48 h intake and output, last 24 h labs and trends, and last 24 h imaging results. I have reviewed this  patient's available data, including medical history, events of note, test results, etc as part of my evaluation.  A significant portion of that time was spent in counseling.  Care during the described time interval was provided by me.  This care required straight-forward level of medical decision making.  04/15/2022    Subjective: (Chief complaint)  Patient tolerated clear liquids advance to thicker liquids and doing well.  No nausea or vomiting.  No abdominal pain.  Having flatus.  Feels close to normal.  Objective:  Vital signs:  Vitals:   04/14/22 0551 04/14/22 1356 04/14/22 2135 04/15/22 0518  BP: (!) 158/77 (!) 155/73 (!) 147/76 (!) 123/56  Pulse: 88 89 70 75  Resp: '18 18 18 18  '$ Temp: 97.9 F (36.6 C) 98.7 F (37.1 C) 98.7 F (37.1 C) 98 F (36.7 C)  TempSrc: Oral Oral Oral Oral  SpO2: 99% 99% 100% 96%  Weight:      Height:        Last BM Date : 04/14/22  Intake/Output   Yesterday:  08/05 0701 - 08/06 0700 In: 2550.6 [P.O.:380; I.V.:1865.8; IV Piggyback:304.8] Out: 0  This shift:  Total I/O In: 120 [P.O.:120] Out: -   Bowel function:  Flatus: YES  BM:  YES  Drain: Bilious from NGT   Physical Exam:  General: Pt awake/alert in no acute distress.  Walking around room independently.  No guarding or splinting or hesitancy.  Not toxic.  Not sickly.  Calm and relaxed. Eyes: PERRL, normal EOM.  Sclera clear.  No icterus Neuro: CN II-XII intact w/o focal sensory/motor deficits. Lymph: No head/neck/groin lymphadenopathy Psych:  No delerium/psychosis/paranoia.  Oriented x 4 HENT: Normocephalic, Mucus membranes moist.  No thrush Neck: Supple, No tracheal deviation.  No obvious thyromegaly Chest: No pain to chest wall compression.  Good respiratory excursion.  No audible wheezing CV:  Pulses intact.  Regular rhythm.  No major extremity edema MS: Normal AROM mjr joints.  No obvious deformity  Abdomen: Soft.  Nondistended.  Nontender.  No evidence of  peritonitis.  No incarcerated hernias.  Ext:  No deformity.  No mjr edema.  No cyanosis Skin: No petechiae / purpurea.  No major sores.  Warm and dry    Results:   Cultures: No results found for this or any previous visit (from the past 720 hour(s)).  Labs: Results for orders placed or performed during the hospital encounter of 04/12/22 (from the past 48 hour(s))  Comprehensive metabolic panel     Status: Abnormal   Collection Time: 04/14/22  4:32 AM  Result Value Ref Range   Sodium 138 135 - 145 mmol/L   Potassium 3.2 (L) 3.5 - 5.1 mmol/L    Comment: DELTA CHECK NOTED   Chloride 99 98 - 111 mmol/L   CO2 29 22 - 32 mmol/L   Glucose, Bld 89 70 - 99 mg/dL    Comment: Glucose reference range applies only to samples taken after fasting for at least 8 hours.   BUN 9 8 - 23 mg/dL   Creatinine, Ser 0.56 0.44 - 1.00 mg/dL   Calcium 9.0 8.9 - 10.3 mg/dL   Total Protein 6.6 6.5 - 8.1 g/dL   Albumin 3.7 3.5 - 5.0 g/dL   AST 20 15 - 41 U/L   ALT 12 0 - 44 U/L   Alkaline Phosphatase 94 38 - 126 U/L   Total Bilirubin 1.5 (H) 0.3 - 1.2 mg/dL   GFR, Estimated >60 >60 mL/min    Comment: (NOTE) Calculated using the CKD-EPI Creatinine Equation (2021)    Anion gap 10 5 - 15    Comment: Performed at [facility], [place] [address], [place], [place] [address]  CBC     Status: None   Collection Time: 04/14/22  4:32 AM  Result Value Ref Range   WBC 6.3 4.0 - 10.5 K/uL   RBC 4.36 3.87 - 5.11 MIL/uL   Hemoglobin 12.9 12.0 - 15.0 g/dL   HCT 41.0 36.0 - 46.0 %   MCV 94.0 80.0 - 100.0 fL   MCH 29.6 26.0 - 34.0 pg   MCHC 31.5 30.0 - 36.0 g/dL   RDW 13.4 11.5 - 15.5 %   Platelets 231 150 - 400 K/uL   nRBC 0.0 0.0 - 0.2 %    Comment: Performed at [facility], [place] [address] [place], [place] [address]  Basic metabolic panel     Status: Abnormal   Collection Time: 04/15/22  5:10 AM  Result Value Ref Range   Sodium 139 135 - 145 mmol/L   Potassium 3.3 (L)  3.5 - 5.1 mmol/L   Chloride 106 98 - 111 mmol/L   CO2 27 22 - 32 mmol/L   Glucose, Bld 90 70 - 99 mg/dL    Comment: Glucose reference range applies only to samples taken after fasting for at least 8 hours.   BUN 7 (L) 8 - 23 mg/dL   Creatinine, Ser 0.64 0.44 - 1.00 mg/dL   Calcium 9.0 8.9 - 10.3 mg/dL   GFR, Estimated >60 >60 mL/min    Comment: (NOTE) Calculated using the CKD-EPI Creatinine  Equation (2021)    Anion gap 6 5 - 15    Comment: Performed at [facility], [place] [address], [place], [place] [address]  Magnesium     Status: None   Collection Time: 04/15/22  5:10 AM  Result Value Ref Range   Magnesium 1.8 1.7 - 2.4 mg/dL    Comment: Performed at [facility], [place] [address] [place], [place] [address]   *Note: Due to a large number of results and/or encounters for the requested time period, some results have not been displayed. A complete set of results can be found in Results Review.    Imaging / Studies: DG Abd Portable 1V-Small Bowel Obstruction Protocol-initial, 8 hr delay  Result Date: 04/13/2022 CLINICAL DATA:  Left lower quadrant pain, suspected partial small-bowel obstruction EXAM: PORTABLE ABDOMEN - 1 VIEW COMPARISON:  04/13/2022 FINDINGS: Supine frontal view of the abdomen and pelvis demonstrates enteric catheter tip projecting over the gastric antrum. Oral contrast administered 8 hours previously has progressed into the colon by the time of the exam. No evidence of high-grade bowel obstruction on this study. Minimally distended gas-filled loops of jejunum within the left lower quadrant measure up to 2.7 cm in diameter, nonspecific. No abdominal masses or abnormal calcifications. No acute bony abnormalities. IMPRESSION: 1. No evidence of bowel obstruction, with passage of oral contrast throughout the colon by the time of imaging. 2. Mild nonspecific dilation of a single loop of jejunum in the left lower quadrant, with decreased prominence  since prior CT. Electronically Signed   By: Randa Ngo M.D.   On: 04/13/2022 16:09    Medications / Allergies: per chart  Antibiotics: Anti-infectives (From admission, onward)    None         Note: Portions of this report may have been transcribed using voice recognition software. Every effort was made to ensure accuracy; however, inadvertent computerized transcription errors may be present.   Any transcriptional errors that result from this process are unintentional.    Adin Hector, MD, FACS, MASCRS Esophageal, Gastrointestinal & Colorectal Surgery Robotic and Minimally Invasive Surgery  [facility]. [address], [place], [place] [address] [phone] Fax [phone] Main  CONTACT INFORMATION:  Weekday (9AM-5PM): Call [facility] main office at [phone]  Weeknight (5PM-9AM) or Weekend/Holiday: Check [email or web address] (password " [email or web address]") for General Surgery [facility] coverage  (Please, do not use SecureChat as it is not reliable communication to reach operating surgeons for immediate patient care)      04/15/2022  9:47 AM

## 2022-04-15 NOTE — Discharge Summary (Signed)
Discharge Summary  Summer [name] [id number]:[id number] DOB: May 23, 1951  PCP: Hoyt Koch, MD  Admit date: 04/12/2022 Discharge date: 04/15/2022  Time spent: 57mns  Recommendations for Outpatient Follow-up:  F/u with PCP within a week  for hospital discharge follow up, repeat cbc/bmp at follow up  Discharge Diagnoses:  Active Hospital Problems   Diagnosis Date Noted   [place] small bowel obstruction ([place] 04/13/2022   Hyperglycemia 04/13/2022   HLD (hyperlipidemia) 04/13/2022   Malignant neoplasm of upper-outer quadrant of right breast in female, estrogen receptor positive ([place] 11/17/2018   Trigeminal neuralgia of right side of face 11/30/2010    Resolved Hospital Problems  No resolved problems to display.    Discharge Condition: stable  Diet recommendation: heart healthy  Filed Weights   04/12/22 2258  Weight: 90.7 kg    History of present illness: ( per admitting provider Dr KMarylyn Ishihara CEthelene BrownsBCrystalynn Mcinerneyis a 71y.o. female with medical history significant of breast cancer, trigeminal neuralgia. Presenting with stomach pain. Her symptoms started 2 nights ago. There was severe, sharp pain in her epigastric and LUQ areas. She tried some alka seltzer, MoM and pepto. They seemed to help a little. She had N but no V. She reports some loose stools. When she woke yesterday, she didn't feel like eating much. Her abdominal pain persisted despite taking more alka seltzer and MoM. When her symptoms did not improve last night, she decided to come to the ED for assistance.  Hospital Course:  Principal Problem:   Partial small bowel obstruction ([facility]) Active Problems:   Trigeminal neuralgia of right side of face   Malignant neoplasm of upper-outer quadrant of right breast in female, estrogen receptor positive ([facility])   Hyperglycemia   HLD (hyperlipidemia)   Assessment and Plan:    pSBO -Likely from adhesion, Seen by surgery, started on small bowel protocol, NG inserted  on admission, resolved quickly, ng removed the next day, able to tolerate diet advancement , having bowel functions, she desires to go home, she is cleared by gen surg to discharge    Hypokalemia Replaced K, mag 1.8 Repeat bmp at hospital discharge follow up   Hyperlipidemia Resume statin    Trigeminal neuralgia Resume Neurontin   History of breast cancer status post lumpectomy, resume anastrozole.    Body mass index is 32.28 kg/m..Marland Kitchen Meet obesity criteria Reports started semaglutide 2 months ago, stopped using a month ago Recommend against semaglutide in the setting of pSBO   Discharge Exam: BP (!) 123/56 (BP Location: Left Arm)   Pulse 75   Temp 98 F (36.7 C) (Oral)   Resp 18   Ht '5\' 6"'$  (1.676 m)   Wt 90.7 kg   SpO2 96%   BMI 32.28 kg/m   General: NAD, pleasant  Cardiovascular: RRR Respiratory: CTABL    Discharge Instructions     Diet - low sodium heart healthy   Complete by: As directed    Increase activity slowly   Complete by: As directed       Allergies as of 04/15/2022       Reactions   Keflex [cephalexin] Anaphylaxis, Other (See Comments)   Caused violent dizziness and syncope   Clindamycin Hcl Itching        Medication List     STOP taking these medications    Semaglutide-Weight Management 0.25 MG/0.5ML Soaj   Wegovy 0.5 MG/0.5ML Soaj Generic drug: Semaglutide-Weight Management       TAKE these medications    anastrozole  1 MG tablet Commonly known as: ARIMIDEX Take 1 tablet (1 mg total) by mouth daily. What changed: when to take this   diclofenac Sodium 1 % Gel Commonly known as: Voltaren Apply 2 g topically 4 (four) times daily.   DULoxetine 60 MG capsule Commonly known as: CYMBALTA TAKE 1 CAPSULE BY MOUTH EVERY DAY What changed:  how much to take how to take this when to take this additional instructions   gabapentin 600 MG tablet Commonly known as: NEURONTIN TAKE 2 TABLETS BY MOUTH 3 TIMES A DAY. Please call  [phone] to schedule an appt. What changed:  how much to take how to take this when to take this   Melatonin 3 MG Caps Take 1 capsule (3 mg total) by mouth at bedtime.   pravastatin 20 MG tablet Commonly known as: PRAVACHOL Take 1 tablet (20 mg total) by mouth daily. What changed: when to take this       Allergies  Allergen Reactions   Keflex [Cephalexin] Anaphylaxis and Other (See Comments)    Caused violent dizziness and syncope   Clindamycin Hcl Itching    Follow-up Information     Hoyt Koch, MD Follow up in 2 week(s).   Specialty: Internal Medicine Why: hospital discharge follow up, repeat basic lab works including cbc/bmp. Contact information: [place] [address] [phone]         Sueanne Margarita, MD .   Specialty: Cardiology Contact information: [phone] [address]. [address] [place] [address] [phone]                  The results of significant diagnostics from this hospitalization (including imaging, microbiology, ancillary and laboratory) are listed below for reference.    Significant Diagnostic Studies: DG Abd Portable 1V-Small Bowel Obstruction Protocol-initial, 8 hr delay  Result Date: 04/13/2022 CLINICAL DATA:  Left lower quadrant pain, suspected partial small-bowel obstruction EXAM: PORTABLE ABDOMEN - 1 VIEW COMPARISON:  04/13/2022 FINDINGS: Supine frontal view of the abdomen and pelvis demonstrates enteric catheter tip projecting over the gastric antrum. Oral contrast administered 8 hours previously has progressed into the colon by the time of the exam. No evidence of high-grade bowel obstruction on this study. Minimally distended gas-filled loops of jejunum within the left lower quadrant measure up to 2.7 cm in diameter, nonspecific. No abdominal masses or abnormal calcifications. No acute bony abnormalities. IMPRESSION: 1. No evidence of bowel obstruction, with passage of oral contrast throughout the  colon by the time of imaging. 2. Mild nonspecific dilation of a single loop of jejunum in the left lower quadrant, with decreased prominence since prior CT. Electronically Signed   By: Randa Ngo M.D.   On: 04/13/2022 16:09   DG Abd Portable 1 View  Result Date: 04/13/2022 CLINICAL DATA:  Check gastric catheter placement EXAM: PORTABLE ABDOMEN - 1 VIEW COMPARISON:  None Available. FINDINGS: Gastric catheter extends into the stomach. No free air is seen. No obstructive changes are noted. IMPRESSION: Gastric catheter within the stomach. Electronically Signed   By: Inez Catalina M.D.   On: 04/13/2022 03:32   CT ABDOMEN PELVIS W CONTRAST  Result Date: 04/13/2022 CLINICAL DATA:  Left lower quadrant pain for 1 day, initial encounter EXAM: CT ABDOMEN AND PELVIS WITH CONTRAST TECHNIQUE: Multidetector CT imaging of the abdomen and pelvis was performed using the standard protocol following bolus administration of intravenous contrast. RADIATION DOSE REDUCTION: This exam was performed according to the departmental dose-optimization program which includes automated  exposure control, adjustment of the mA and/or kV according to patient size and/or use of iterative reconstruction technique. CONTRAST:  149m OMNIPAQUE IOHEXOL 300 MG/ML  SOLN COMPARISON:  01/11/2021 FINDINGS: Lower chest: No acute abnormality. Hepatobiliary: No focal liver abnormality is seen. No gallstones, gallbladder wall thickening, or biliary dilatation. Pancreas: Unremarkable. No pancreatic ductal dilatation or surrounding inflammatory changes. Spleen: Normal in size without focal abnormality. Adrenals/Urinary Tract: Adrenal glands are within normal limits. Nonobstructing right renal calculi are noted the largest of which measures 5 mm. No obstructive changes are seen. Left kidney demonstrates no renal calculi. No obstructive changes are seen. Bladder is partially distended. Stomach/Bowel: Postsurgical changes are noted in the sigmoid colon. More  proximal colon shows no obstructive or inflammatory change. Scattered diverticula are noted without diverticulitis. The appendix is not well visualized. No inflammatory changes are identified to suggest appendicitis. The stomach is decompressed. Mild jejunal dilatation is noted with a transition zone identified along the anterior aspect of the abdominal wall likely related to adhesions. This is best seen on image number 52 of series 6. More distal small bowel is within normal limits. Vascular/Lymphatic: Aortic atherosclerosis. No enlarged abdominal or pelvic lymph nodes. Reproductive: Status post hysterectomy. No adnexal masses. Other: Small fat containing ventral hernia is seen best noted on image number 53 of series 6 adjacent to the transition zone. Seconds abdominal wall defect is noted likely related to prior sigmoid colostomy. Musculoskeletal: No acute or significant osseous findings. IMPRESSION: Multiple dilated loops of jejunum with transition zone in the anterior abdomen likely related to adhesions. This is consistent with a partial small bowel obstruction. Diverticulosis without diverticulitis. Anterior abdominal wall fat containing hernias as described. Nonobstructing right renal calculi. Electronically Signed   By: MInez CatalinaM.D.   On: 04/13/2022 01:30    Microbiology: No results found for this or any previous visit (from the past 240 hour(s)).   Labs: Basic Metabolic Panel: Recent Labs  Lab 04/12/22 2313 04/14/22 0432 04/15/22 0510  NA 140 138 139  K 4.3 3.2* 3.3*  CL 103 99 106  CO2 '29 29 27  '$ GLUCOSE 121* 89 90  BUN 10 9 7*  CREATININE 0.62 0.56 0.64  CALCIUM 9.4 9.0 9.0  MG  --   --  1.8   Liver Function Tests: Recent Labs  Lab 04/12/22 2313 04/14/22 0432  AST 26 20  ALT 15 12  ALKPHOS 102 94  BILITOT 1.4* 1.5*  PROT 7.3 6.6  ALBUMIN 3.9 3.7   Recent Labs  Lab 04/12/22 2313  LIPASE 22   No results for input(s): "AMMONIA" in the last 168 hours. CBC: Recent  Labs  Lab 04/12/22 2313 04/14/22 0432  WBC 7.7 6.3  NEUTROABS 6.2  --   HGB 14.6 12.9  HCT 45.3 41.0  MCV 91.9 94.0  PLT 252 231   Cardiac Enzymes: No results for input(s): "CKTOTAL", "CKMB", "CKMBINDEX", "TROPONINI" in the last 168 hours. BNP: BNP (last 3 results) No results for input(s): "BNP" in the last 8760 hours.  ProBNP (last 3 results) No results for input(s): "PROBNP" in the last 8760 hours.  CBG: No results for input(s): "GLUCAP" in the last 168 hours.  FURTHER DISCHARGE INSTRUCTIONS:   Get Medicines reviewed and adjusted: Please take all your medications with you for your next visit with your Primary MD   Laboratory/radiological data: Please request your Primary MD to go over all hospital tests and procedure/radiological results at the follow up, please ask your Primary MD to get all  Hospital records sent to his/her office.   In some cases, they will be blood work, cultures and biopsy results pending at the time of your discharge. Please request that your primary care M.D. goes through all the records of your hospital data and follows up on these results.   Also Note the following: If you experience worsening of your admission symptoms, develop shortness of breath, life threatening emergency, suicidal or homicidal thoughts you must seek medical attention immediately by calling 911 or calling your MD immediately  if symptoms less severe.   You must read complete instructions/literature along with all the possible adverse reactions/side effects for all the Medicines you take and that have been prescribed to you. Take any new Medicines after you have completely understood and accpet all the possible adverse reactions/side effects.    Do not drive when taking Pain medications or sleeping medications (Benzodaizepines)   Do not take more than prescribed Pain, Sleep and Anxiety Medications. It is not advisable to combine anxiety,sleep and pain medications without talking  with your primary care practitioner   Special Instructions: If you have smoked or chewed Tobacco  in the last 2 yrs please stop smoking, stop any regular Alcohol  and or any Recreational drug use.   Wear Seat belts while driving.   Please note: You were cared for by a hospitalist during your hospital stay. Once you are discharged, your primary care physician will handle any further medical issues. Please note that NO REFILLS for any discharge medications will be authorized once you are discharged, as it is imperative that you return to your primary care physician (or establish a relationship with a primary care physician if you do not have one) for your post hospital discharge needs so that they can reassess your need for medications and monitor your lab values.     Signed:  Florencia Reasons MD, PhD, FACP  [facility] 04/15/2022, 11:49 AM

## 2022-04-16 ENCOUNTER — Telehealth: Payer: Self-pay | Admitting: *Deleted

## 2022-04-16 NOTE — Telephone Encounter (Signed)
Message sent to Donna for cosmetic quote on Panniculectomy.   Called patient to see if she wants to consider estimate or just move forward with Breast reduction since that portion was approved by insurance.   No answer and no option for VM (kept saying it couldn't hear me)  [employer] CALL INFO: EMPLOYEE PHONE #: [phone]

## 2022-04-17 ENCOUNTER — Other Ambulatory Visit: Payer: Self-pay | Admitting: Neurology

## 2022-04-17 NOTE — Telephone Encounter (Signed)
Rx refilled.

## 2022-04-25 ENCOUNTER — Encounter: Payer: Self-pay | Admitting: Plastic Surgery

## 2022-04-26 ENCOUNTER — Ambulatory Visit: Payer: Federal, State, Local not specified - PPO | Admitting: Neurology

## 2022-04-30 ENCOUNTER — Ambulatory Visit (INDEPENDENT_AMBULATORY_CARE_PROVIDER_SITE_OTHER): Payer: Self-pay | Admitting: Plastic Surgery

## 2022-04-30 ENCOUNTER — Encounter: Payer: Self-pay | Admitting: Plastic Surgery

## 2022-04-30 VITALS — BP 152/80 | HR 68 | Ht 66.0 in | Wt 198.0 lb

## 2022-04-30 DIAGNOSIS — L905 Scar conditions and fibrosis of skin: Secondary | ICD-10-CM

## 2022-04-30 DIAGNOSIS — H02831 Dermatochalasis of right upper eyelid: Secondary | ICD-10-CM

## 2022-04-30 DIAGNOSIS — N62 Hypertrophy of breast: Secondary | ICD-10-CM

## 2022-04-30 DIAGNOSIS — H02834 Dermatochalasis of left upper eyelid: Secondary | ICD-10-CM

## 2022-04-30 DIAGNOSIS — M546 Pain in thoracic spine: Secondary | ICD-10-CM

## 2022-04-30 DIAGNOSIS — M545 Low back pain, unspecified: Secondary | ICD-10-CM

## 2022-04-30 NOTE — Progress Notes (Signed)
   Referring Provider Hoyt Koch, MD [address] [place],  [place] [address]   CC:  Breast and abdomen surgery  Summer [name] is an 71 y.o. female.  HPI: Patient is 71 year old who is interested in breast reduction as well as abdominal surgery she also mention she is possibly interested in upper eyelid blepharoplasty.  She recently had a hospital mission for small bowel obstruction but this has resolved.  She has a history of abdominal surgery for diverticulitis.  Review of Systems General: No unintentional weight loss, no fever or chills  Physical Exam    04/30/2022   11:42 AM 04/15/2022    5:18 AM 04/14/2022    9:35 PM  Vitals with BMI  Height '5\' 6"'$     Weight 198 lbs    BMI 70.26    Systolic 378 588 502  Diastolic 80 56 76  Pulse 68 75 70    General:  No acute distress,  Alert and oriented, Non-Toxic, Normal speech and affect HEENT: Significant upper eyelid skin excess but lid skin is not resting on the lashes.  MRD is 3 bilaterally. Breast sternal notch to nipple 39 on the right and 44 on the left, base width 19 on the right and 20 on the left, nipple to fold 13 on the right and 18 on the left Abdomen: She has a vertical scar below her umbilicus.  Some excess skin above and below the umbilicus.  Assessment/Plan 1) breast: Patient is a good candidate for breast reduction.  I agree with 675 g could be removed from each side.  Given her measurements there is a good chance we would perform free nipple grafts on her.  We also discussed the history of radiation on the right breast and that she would have an increased risk of wound healing problems.  Given her recent hospitalization for small bowel obstruction I will wait to wait a little while before performing major surgery on her since she may require opioids.  2) patient is a candidate for abdominoplasty or panniculectomy.  We discussed that abdominoplasty would have the additional benefit that we would remove  her vertical scar below her umbilicus.  I would want to wait at least several months from her small bowel obstruction before we would offer her any abdominal surgery to make sure this does not recur in the near future.  3) patient is a candidate for upper blepharoplasty but I would like to perform a visual field to confirm whether this is functionally significant for her.  Gordy Goar 04/30/2022, 1:20 PM

## 2022-05-02 ENCOUNTER — Encounter: Payer: Self-pay | Admitting: Internal Medicine

## 2022-05-08 ENCOUNTER — Encounter: Payer: Self-pay | Admitting: Internal Medicine

## 2022-05-15 ENCOUNTER — Encounter: Payer: Self-pay | Admitting: Plastic Surgery

## 2022-05-16 ENCOUNTER — Encounter: Payer: Self-pay | Admitting: *Deleted

## 2022-05-16 NOTE — Addendum Note (Signed)
Addended by: Lennice Sites on: 05/16/2022 01:36 PM   Modules accepted: Orders

## 2022-05-17 ENCOUNTER — Telehealth: Payer: Self-pay

## 2022-05-17 NOTE — Telephone Encounter (Signed)
Faxed external referral to [facility] for visual field test; received confirmation fax.

## 2022-05-21 NOTE — Telephone Encounter (Signed)
I agree with your response that they can be done on the same day.  She may be able to go home if the surgery is under 6 hours but should anticipate the possibility of an overnight stay.  Thanks

## 2022-05-25 NOTE — Telephone Encounter (Signed)
Pt is aware that she has an appt with Dr. Antionette Fairy with [facility] in [facility] on Sept 25 @ 10 am. Per their office, once pt completes visual field exam, it can be interpreted the week after. Pt conveyed understanding.

## 2022-05-30 ENCOUNTER — Telehealth: Payer: Self-pay | Admitting: Physician Assistant

## 2022-05-30 NOTE — Telephone Encounter (Signed)
Ms. Loura Back returned a call from our office. She was wondering if both the abdominal and breast surgery could be done at the same time. If so she would like to know how much the total cost would be. Also she received a quote for her abdominal surgery and was told that insurance would not cover overnight stay and wants to know how much it would be in the event she had to stay overnight. I will talk with Dr. Erin Hearing and return her call with further information.

## 2022-05-31 ENCOUNTER — Telehealth: Payer: Self-pay | Admitting: *Deleted

## 2022-05-31 NOTE — Telephone Encounter (Signed)
Spoke to pt to review surgery quote for abdominoplasty. Informed her that there will be an additional fee of $800 paid to [facility] if she requires an overnight stay. Also advised her that Dr. Erin Hearing would like to see her in October to evaluate s/p her admission for small bowel obstruction in August before scheduling surgery. He said he can show her before/after pictures at that time. Pt voiced understanding. Message given to front desk to contact Ms. Kaleta to schedule appt.

## 2022-06-05 ENCOUNTER — Ambulatory Visit: Payer: Federal, State, Local not specified - PPO | Admitting: Internal Medicine

## 2022-06-05 DIAGNOSIS — K5900 Constipation, unspecified: Secondary | ICD-10-CM | POA: Diagnosis not present

## 2022-06-05 DIAGNOSIS — R634 Abnormal weight loss: Secondary | ICD-10-CM | POA: Diagnosis not present

## 2022-06-05 NOTE — Progress Notes (Signed)
   Subjective:   Patient ID: Summer [name], female    DOB: [birth year], 71 y.o.   MRN: [id number]  HPI The patient is a 71 YO female coming in for hospital follow up. Admitted about 1 month ago with SBO. Bowels still constipated lately which is chronic for her. No nausea or vomiting or stomach pain. She is taking meds as needed. Some spotting of blood recently 1 episode no recurrence.   PMH, [facility], social history reviewed and updated  Review of Systems  Constitutional: Negative.   HENT: Negative.    Eyes: Negative.   Respiratory:  Negative for cough, chest tightness and shortness of breath.   Cardiovascular:  Negative for chest pain, palpitations and leg swelling.  Gastrointestinal:  Positive for constipation. Negative for abdominal distention, abdominal pain, diarrhea, nausea and vomiting.  Musculoskeletal: Negative.   Skin: Negative.   Neurological: Negative.   Psychiatric/Behavioral: Negative.      Objective:  Physical Exam Constitutional:      Appearance: She is well-developed.  HENT:     Head: Normocephalic and atraumatic.  Cardiovascular:     Rate and Rhythm: Normal rate and regular rhythm.  Pulmonary:     Effort: Pulmonary effort is normal. No respiratory distress.     Breath sounds: Normal breath sounds. No wheezing or rales.  Abdominal:     General: Bowel sounds are normal. There is no distension.     Palpations: Abdomen is soft.     Tenderness: There is no abdominal tenderness. There is no rebound.  Musculoskeletal:     Cervical back: Normal range of motion.  Skin:    General: Skin is warm and dry.  Neurological:     Mental Status: She is alert and oriented to person, place, and time.     Coordination: Coordination normal.     Vitals:   06/05/22 1555  BP: 126/86  Pulse: 74  Temp: 97.6 F (36.4 C)  SpO2: 98%  Weight: 205 lb (93 kg)  Height: '5\' 6"'$  (1.676 m)    Assessment & Plan:  Visit time 20 minutes in face to face communication with patient  and coordination of care, additional 10 minutes spent in record review, coordination or care, ordering tests, communicating/referring to other healthcare professionals, documenting in medical records all on the same day of the visit for total time 30 minutes spent on the visit.

## 2022-06-05 NOTE — Patient Instructions (Signed)
I think you are fine to do the surgery.

## 2022-06-07 ENCOUNTER — Telehealth: Payer: Self-pay | Admitting: *Deleted

## 2022-06-07 NOTE — Telephone Encounter (Signed)
Was informed today by Dr. Wyvonne Lenz office that the patient was scheduled an appointment for (06/04/22),which she was late for, and could not be seen that day.    So the patient was scheduled another appointment for (06/11/22), which she called back and cancelled.//AB/CMA

## 2022-06-08 ENCOUNTER — Encounter: Payer: Self-pay | Admitting: Internal Medicine

## 2022-06-08 NOTE — Assessment & Plan Note (Signed)
Is considering abdominoplasty due to weight loss she does have chronically irritated skin. She is cleared to have this if desired. No signs of SBO. We discussed bowel regimen as operation and medication post-op will make her higher risk for recurrence.

## 2022-06-08 NOTE — Assessment & Plan Note (Signed)
Advised it will be important to have regular BM to avoid SBO. She will be having upcoming surgery and will need bowel regimen. Counseled about options otc and prescription.

## 2022-06-12 ENCOUNTER — Telehealth: Payer: Self-pay | Admitting: Plastic Surgery

## 2022-06-12 NOTE — Telephone Encounter (Signed)
LVM and MyChart message to please call the office to reschedule appt for 10/25 with a different provider due to previous provider leaving practice.

## 2022-06-14 DIAGNOSIS — H0230 Blepharochalasis unspecified eye, unspecified eyelid: Secondary | ICD-10-CM | POA: Diagnosis not present

## 2022-06-19 ENCOUNTER — Encounter: Payer: Self-pay | Admitting: Neurology

## 2022-06-19 ENCOUNTER — Ambulatory Visit: Payer: Federal, State, Local not specified - PPO | Admitting: Neurology

## 2022-06-19 VITALS — BP 125/78 | HR 76 | Ht 66.0 in | Wt 209.0 lb

## 2022-06-19 DIAGNOSIS — G500 Trigeminal neuralgia: Secondary | ICD-10-CM

## 2022-06-19 MED ORDER — DULOXETINE HCL 60 MG PO CPEP
ORAL_CAPSULE | ORAL | 3 refills | Status: DC
Start: 2022-06-19 — End: 2023-06-26

## 2022-06-19 MED ORDER — GABAPENTIN 600 MG PO TABS
ORAL_TABLET | ORAL | 3 refills | Status: DC
Start: 2022-06-19 — End: 2023-06-25

## 2022-06-19 MED ORDER — BACLOFEN 10 MG PO TABS
10.0000 mg | ORAL_TABLET | Freq: Two times a day (BID) | ORAL | 3 refills | Status: DC
Start: 2022-06-19 — End: 2023-06-26

## 2022-06-19 NOTE — Progress Notes (Signed)
PATIENT: Summer [name] DOB: [birth year]  REASON FOR VISIT: Follow up right sided trigeminal neuralgia  HISTORY FROM: Patient PRIMARY NEUROLOGIST: Dr. Krista Blue   HISTORY  Summer [name] is a 71 years old right-handed African American female, referred by her primary care physician Dr. Stevie Kern for evaluation of right facial pain   She was diagnosed with right trigeminal neuralgia in 2006, presenting with severe electronics shooting pain at her right upper molar, she had microvascular decompression surgery at [facility] by Dr. Harrison Mons around 2006, which has helped the severe pain, but she continued to have annoying almost constant pain at her right face, she describes hot cold sensitivity over right face, touching of right forehead region will also set of her right facial pain, she felt right tongue swelling, she bites on her right tongue sometimes, she works at customer service, on the phone many hours each day, with prolonged talking, she felt sandpaper was rubbing against her right tongue, also complains of right upper and lower lip numbness, intermittent electronics shooting pain,   Over the years, she has tried different medication, was evaluated by our office Dr. Brett Fairy in 2008, she has tried gabapentin, Lyrica, Cymbalta, Trileptal, currently is taking Tegretol 200 mg twice a day, initially it helps her some, but failed to help work consistently, She denied visual change, recent few months of left lower back, left hip pain, radiating to left posterior thigh, to her left leg and left foot, difficulty walking after prolonged sitting  She complains of left low back pain, left hip pain, radiating pain to left foot, like walking on a dead, burning sensation, she continued to complains of right facial constant achy pain, there was no significant change after started on Oxtellar xR 300 mg every day, no significant side effects,   I have reviewed MRI, mild small vessel disease, no  significant abnormality otherwise,   She had MRI lumbar spine (without) demonstrating: L5-S1: facet hypertrophy with moderate right and severe left foraminal stenosis  L4-5: pseudo disc bulging and facet hypertrophy with moderate right and mild left foraminal stenosis. There is 2 mm anterior spondylolisthesis of L4 on L5.  Degenerative spondylosis and disc bulging L1-2 and L4-5.   UPDATE Sep 30 2014:YY She felt sand paper sensation in her right tongue, flashing sensation, last 1-2 weeks, only take gabapentin 371m tid now. She also complains of snoring, frequent wakening during the nighttime, excessive sleepiness at daytime, F ESS score is 37, ESS is 9   UPDATE 01/26/2017[name] 71year old female returns for follow-up. She has a history of right trigeminal neuralgia is currently on Neurontin 300 mg 3 times a day has breakthrough pain. 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 She occasionally will have upper and lower lip numbness. 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,   Update March 01 2016:YY She presented to the emergency room February 21 2016 for nausea, unexpected fall, night before that because severe facial pain, she took extra dose of lamotrigine, made it to 250 mg that particular day, laboratory evaluation showed  mildly low potassium of CMP WAS GIVEN SUPPLEMENT, NORMAL CBC, lamotrigine level in June 15 was 15.6,  For a while, she had severe right facial pain, was taking gabapentin 300 mg 2 tablets every 2 hours, 3 tablets at night, about 11 tablets each day 3300 mg every day plus  lamotrigine 100 mg twice a day,    Lamotrigine 100 mg twice a day has been very helpful, it has taking care of her long bothered right tongue sandpaper sensation. She is now taking gabapentin 300 mg 3/2/3 tablets each day, she has been off lamotrigine since February 26 2016, the sandpaper sensation on the right side of the tongue came back, she is now taking Cymbalta 30 mg 3 times a day   She has been complains 3 weeks history of urinary urgency, difficulty initiating urine, UA showed mild UTI, was treated with Cipro without improving her symptoms   UPDATE May 10 2016:YY She was evaluated by Dr. Salomon Fick in July 2017, is planning on to have gamma knife in early September, she complains of mild memory loss, difficulty handling her computer tasks sometimes, there was no family history of memory loss, she also complains of episode, right after she get out of the bed,  She is on polypharmacy, now taking lamotrigine 100 mg twice a day, gabapentin 300 mg 4 in the morning, 2 at lunch time, 4 tablets at nighttime. She also has obesity, snoring, excessive daytime fatigue and sleepiness   We have personally reviewed CT head without contrast in June 2017, no acute abnormality, MRI of the cervical in 2008, multilevel degenerative changes, most severe at C5-6, C6-7, with herniated disc, mild canal stenosis, mild cord signal changes.   Laboratory evaluation in 2017, elevated WBC 11 point 8, hemoglobin of 13, no significant abnormality on BMP   UPDATE Apr 02 2017:YY We have personally reviewed MRI of the brain in April 2018 generalized atrophy mild supratentorium small vessel disease MRI of cervical spine April, multilevel degenerative changes most severe at C6-7, mild canal stenosissignificant foraminal narrowing or signal changes,   She had a, knife by [facility] Dr. Angelene Giovanni in September 2017 she denies significant improvement, now cold air still trigger her right facial pain, bleeding from right temporal  region along the right face, right jaw, electronic shooting pain, to her right lower molar region, she has intermittent left hand tremor, driving,   She is now taking Lamictal 100 mg 3 tablets each day, gabapentin 600 mg 5 tablets each day, despite combination therapy, she remains symptomatic, she is able to go to work.  She is on the phone 10 hours a day, mild memory loss.  I reviewed laboratory evaluation in 2018, negative troponin, CBC with hemoglobin of 11.9, CMP, creatinine of 0.84, ESR, mild elevated C reactive protein 7.8, lamotrigine 12.9, TSH 1.2 7,    UPDATE Sept 1 2020: She was diagnosed with right breast cancer, had right lobectomy followed by chemo and radiation therapy, since April 2020, she noticed increased bilateral feet paresthesia, and also intermittent bilateral finger paresthesia, despite taking gabapentin 600 mg up to 3 times a day, her right trigeminal pain is under good control with current dose, she also has frequent lower extremity muscle spasm, has tried muscle relaxant methocarbamol with limited help   Virtual Visit via Video Aug 10, 2020 She has left knee replacement on Jun 20 2020, recovering well, still in the process of PT, out of work until Jan 2021.   She complains of left low back pain,  radiating pain to left hip for 6 months, even before her left knee replacement, now made it worse by prolonged sitting,   I personally reviewed x-ray of lumbar in April 2021, significant multilevel degenerative disease, scoliosis   Her right facial trigeminal pain is under reasonable control with current dose of gabapentin 600 mg 2 tablets 3 times a day,   UPDATE March 09 2021: She complains of mild right tongue discomfort, feel swollen, despite taking gabapentin 635m 2 tab tid,   Update October 17, 2021 SS: Tried the Cymbalta 60 mg daily, noted not much change Taking gabapentin 600 mg, 2 tablets 3 times daily, pain is under stable control. Has had gamma knife twice, MVD in past.  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   Today 06/19/22 SS: Right TN pain is under good control. We added baclofen 10 mg BID last time with good benefit. Still on gabapentin 600 mg TID, Cymbalta 60 mg daily. Denies any side effects. Had cataract surgery to left eye. Had SBO in August. Planning to have breast augmentation, abdominoplasty. Still working at call center. Still has some TN pain, but is not significant. Baclofen has really helped. Has intermittent feeling of numbness, to right mouth. Rarely has sharp, electrical pain to right face. Might consider dose reduction when she retires. She does a lot of talking with her job. 04/15/22 creatinine 0.64.  REVIEW OF SYSTEMS: Out of a complete 14 system review of symptoms, the patient complains only of the following symptoms, and all other reviewed systems are negative.  See HPI  ALLERGIES: Allergies  Allergen Reactions   Keflex [Cephalexin] Anaphylaxis and Other (See Comments)    Caused violent dizziness and syncope   Clindamycin Hcl Itching    HOME MEDICATIONS: Outpatient Medications Prior to Visit  Medication Sig Dispense Refill   anastrozole (ARIMIDEX) 1 MG tablet Take 1 tablet (1 mg total) by mouth daily. (Patient taking differently: Take 1 mg by mouth at bedtime.) 90 tablet 3   diclofenac Sodium (VOLTAREN) 1 % GEL Apply 2 g topically 4 (four) times daily. 150 g 0   Melatonin 3 MG CAPS Take 1 capsule (3 mg total) by mouth at bedtime.  0   pravastatin (PRAVACHOL) 20 MG tablet Take 1 tablet (20 mg total) by mouth daily. (Patient taking differently: Take 20 mg by mouth every evening.) 90 tablet 3   baclofen (LIORESAL) 10 MG tablet TAKE 1 TABLET BY MOUTH TWICE A DAY 180 tablet 0   DULoxetine (CYMBALTA) 60 MG capsule TAKE 1 CAPSULE BY MOUTH EVERY DAY (Patient taking differently: Take 60 mg by mouth at bedtime.) 90 capsule 1   gabapentin (NEURONTIN) 600 MG tablet TAKE 2 TABLETS BY MOUTH 3  TIMES A DAY. Please call [phone] schedule an appt. (Patient taking differently: Take 1,200 mg by mouth 3 (three) times daily. TAKE 2 TABLETS BY MOUTH 3 TIMES A DAY. Please call [phone] schedule an appt.) 540 tablet 4   No facility-administered medications prior to visit.    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   Cataract    bilateral - no sx  as of 09/23/2020)   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   Complication of anesthesia    Pt states having some "difficulty waking up"   Constipation 07/01/2018   Costochondritis 05/04/2014   Diverticulitis of colon 06/13/2007   S/p segmental colectomy for perforated diverticulitis    Diverticulosis    Facial pain 10/06/2015   Fibromyalgia    Foot sprain, left, initial encounter 08/31/2016   Glossitis 02/02/2016   Headache 1/32/4401   Helicobacter pylori gastritis 10/2020   HEMATURIA UNSPECIFIED 06/25/2008   Qualifier: Diagnosis of  By: Sherren Mocha MD, Jory Ee    History of kidney stones    History of shingles 2012--  no residual pain   Hx of adenomatous colonic polyps 10/2020   Itching 10/13/2018   Left eye pain 07/01/2018   Left ureteral calculus    LOC OSTEOARTHROS NOT SPEC PRIM/SEC OTH [facility] SITE 06/27/2009   Qualifier: Diagnosis of  By: Sherren Mocha MD, Dellis Filbert A    Malignant neoplasm of upper-outer quadrant of right breast in female, estrogen receptor positive ([place]) 11/17/2018   MCI (mild cognitive impairment)    Memory loss 12/21/2016   Morbid obesity ([place]) 11/09/2010   Pain of right side of body 08/31/2016   Personal history of chemotherapy    Personal history of radiation therapy    Pruritus 10/26/2016   Renal disorder    hx of   Right sided  temporal headache 12/21/2016   Tremor 12/21/2016   Trigeminal neuralgia RIGHT   Trigeminal neuralgia of right side of face 11/30/2010   Trochanteric bursitis, right hip 10/26/2016   Urgency of urination    Weight loss 07/22/2017   Zoster without complications 0/27/2536    PAST SURGICAL HISTORY: Past Surgical History:  Procedure Laterality Date   BREAST BIOPSY Right 11/11/2018   BREAST LUMPECTOMY Right 11/28/2018   BREAST LUMPECTOMY WITH RADIOACTIVE SEED AND SENTINEL LYMPH NODE BIOPSY Right 11/28/2018   Procedure: RIGHT BREAST LUMPECTOMY WITH RADIOACTIVE SEED AND RIGHT AXILLARY SENTINEL LYMPH NODE BIOPSY;  Surgeon: Excell Seltzer, MD;  Location: [place];  Service: General;  Laterality: Right;   CEREBRAL MICROVASCULAR DECOMPRESSION  07-05-2006   RIGHT TRIGEMINAL NERVE   COLONOSCOPY  2013   CG-MAC-moviprep (exc)-normal -mild tics   CYSTOSCOPY/RETROGRADE/URETEROSCOPY/STONE EXTRACTION WITH BASKET  07/04/2012   Procedure: CYSTOSCOPY/RETROGRADE/URETEROSCOPY/STONE EXTRACTION WITH BASKET;  Surgeon: Claybon Jabs, MD;  Location: [place];  Service: Urology;  Laterality: Left;   EXPLORATORY LAPAROTOMY/ RESECTION MID TO DISTAL SIGMOID AND PROXIMAL RECTUM/ END PROXIMAL SIGMOID COLOSTOMY  07-17-2006   PERFORATED DIVERTICULITIS WITH PERITONITIS   EXTRACORPOREAL SHOCK WAVE LITHOTRIPSY Left 01/19/2021   Procedure: LEFT EXTRACORPOREAL SHOCK WAVE LITHOTRIPSY (ESWL);  Surgeon: Bjorn Loser, MD;  [facility]  Service: Urology;  Laterality: Left;   gamma knife  05/17/2016   for trigeminal neuralgia, [facility], Dr Salomon Fick   gamma knife  2019   IR CV LINE INJECTION  10/27/2019   PORTACATH PLACEMENT Left 11/28/2018   Procedure: INSERTION PORT-A-CATH WITH ULTRASOUND;  Surgeon: Excell Seltzer, MD;  Location: [place];  Service: General;  Laterality: Left;   portacath removal     RESECTION COLOSTOMY/ CLOSURE COLOSTOMY WITH COLOPROCTOSTOMY  02-20-2007    TONSILLECTOMY  AS CHILD   TOTAL KNEE ARTHROPLASTY Left 06/20/2020   Procedure: LEFT TOTAL KNEE ARTHROPLASTY;  Surgeon: Leandrew Koyanagi, MD;  Location: [place];  Service: Orthopedics;  Laterality: Left;   URETEROSCOPY  07/04/2012   Procedure: URETEROSCOPY;  Surgeon: Claybon Jabs, MD;  Location: [place];  Service: Urology;  Laterality: Left;   VAGINAL HYSTERECTOMY  1998   Partial   WISDOM TOOTH EXTRACTION      FAMILY HISTORY: Family History  Problem Relation Age of Onset   Heart disease Maternal Grandmother    Diabetes Mother    Breast cancer Maternal Aunt    Lupus Cousin    Colon cancer Neg Hx    Esophageal cancer Neg Hx    Rectal cancer Neg Hx    Stomach cancer Neg Hx    Colon polyps Neg Hx     SOCIAL HISTORY: Social History   Socioeconomic History   Marital status: Married    Spouse name: Not on file   Number of children: 1   Years of education: College   Highest education level: Not on file  Occupational History    Employer: UNEMPLOYED   Occupation: HUMAN RESOURCES     Employer: [employer]  Tobacco Use   Smoking status: Never   Smokeless tobacco: Never  Vaping Use   Vaping Use: Never used  Substance and Sexual Activity   Alcohol use: Yes    Comment: rarely   Drug use: No   Sexual activity: Not on file  Other Topics Concern   Not on file  Social History Narrative   Not on file   Social Determinants of Health   Financial Resource Strain: Not on file  Food Insecurity: Not on file  Transportation Needs: Not on file  Physical Activity: Not on file  Stress: Not on file  Social Connections: Not on file  Intimate Partner Violence: Not on file   PHYSICAL EXAM  Vitals:   06/19/22 1026  BP: 125/78  Pulse: 76  Weight: 209 lb (94.8 kg)  Height: _0  (1.676 m)   Body mass index is 33.73 kg/m.  Generalized: Well developed, in no acute distress   Neurological examination  Mentation: Alert oriented to time, place, history taking.  Follows all commands speech and language fluent Cranial nerve II-XII: Pupils were equal round reactive to light. Extraocular movements were full, visual field were full on confrontational test. Facial sensation and strength were normal. Head turning and shoulder shrug  were normal and symmetric. Motor: The motor testing reveals 5 over 5 strength of all 4 extremities. Good symmetric motor tone is noted throughout.  Sensory: Sensory testing is intact to soft touch on all 4 extremities. No evidence of extinction is noted.  Coordination: Cerebellar testing reveals good finger-nose-finger and heel-to-shin bilaterally.  Gait and station: Gait is normal.  Reflexes: Deep tendon reflexes are symmetric   DIAGNOSTIC DATA (LABS, IMAGING, TESTING) - I reviewed patient records, labs, notes, testing and imaging myself where available.  Lab Results  Component Value Date   WBC 6.3 04/14/2022   HGB 12.9 04/14/2022   HCT 41.0 04/14/2022   MCV 94.0 04/14/2022   PLT 231 04/14/2022      Component Value Date/Time   NA 139 04/15/2022 0510   K 3.3 (L) 04/15/2022 0510   CL 106 04/15/2022 0510   CO2 27 04/15/2022 0510   GLUCOSE 90 04/15/2022 0510   GLUCOSE 96 08/20/2006 1433   BUN 7 (L) 04/15/2022 0510   CREATININE 0.64 04/15/2022 0510   CREATININE 0.89 02/15/2022 0822   CREATININE 0.83 11/18/2020 1628   CALCIUM 9.0 04/15/2022 0510   PROT 6.6 04/14/2022 0432   ALBUMIN 3.7 04/14/2022 0432   AST 20 04/14/2022 0432   AST 15 02/15/2022 3810  ALT 12 04/14/2022 0432   ALT 7 02/15/2022 0822   ALKPHOS 94 04/14/2022 0432   BILITOT 1.5 (H) 04/14/2022 0432   BILITOT 0.6 02/15/2022 0822   GFRNONAA >60 04/15/2022 0510   GFRNONAA >60 02/15/2022 0822   GFRAA >60 06/13/2020 0828   GFRAA >60 04/28/2020 0937   Lab Results  Component Value Date   CHOL 247 (H) 09/25/2021   HDL 65.50 09/25/2021   LDLCALC 166 (H) 09/25/2021   LDLDIRECT 141.1 10/01/2013   TRIG 81.0 09/25/2021   CHOLHDL 4 09/25/2021   Lab  Results  Component Value Date   HGBA1C 5.3 04/13/2022   Lab Results  Component Value Date   DBZMCEYE23 361 11/18/2020   Lab Results  Component Value Date   TSH 1.17 11/18/2020    ASSESSMENT AND PLAN 72 y.o. year old female   1.  Right-sided trigeminal neuralgia -Under better control -Continue current regimen:  -Baclofen 10 mg twice a day -Gabapentin 600 mg, 2 tablets 3 times daily -Cymbalta 60 mg daily  -In the past has tried Lamictal, carbamazepine, Trileptal, Lyrica -Follow-up in 1 year or sooner if needed  Butler Denmark, Laqueta Jean, DNP 06/19/2022, 10:50 AM [facility] [address] [place], [place] [address] [phone]

## 2022-06-20 ENCOUNTER — Other Ambulatory Visit: Payer: Self-pay

## 2022-06-21 ENCOUNTER — Telehealth: Payer: Self-pay | Admitting: *Deleted

## 2022-06-21 NOTE — Telephone Encounter (Signed)
No VFT completed that I can access in Epic / Care Everywhere. Per office patient cancelled visit for 06/11/22. Cannot submit Bleph to insurance without VFT.

## 2022-07-04 ENCOUNTER — Ambulatory Visit: Payer: Federal, State, Local not specified - PPO | Admitting: Plastic Surgery

## 2022-07-13 ENCOUNTER — Ambulatory Visit (INDEPENDENT_AMBULATORY_CARE_PROVIDER_SITE_OTHER): Payer: Federal, State, Local not specified - PPO | Admitting: Plastic Surgery

## 2022-07-13 VITALS — BP 157/76 | HR 88 | Ht 66.0 in | Wt 210.4 lb

## 2022-07-13 DIAGNOSIS — N6489 Other specified disorders of breast: Secondary | ICD-10-CM

## 2022-07-13 DIAGNOSIS — Z923 Personal history of irradiation: Secondary | ICD-10-CM

## 2022-07-13 DIAGNOSIS — E65 Localized adiposity: Secondary | ICD-10-CM | POA: Diagnosis not present

## 2022-07-17 ENCOUNTER — Encounter: Payer: Self-pay | Admitting: Plastic Surgery

## 2022-07-17 NOTE — Progress Notes (Signed)
Referring Provider Hoyt Koch, MD [address] [place],  [place] [address]   CC:  Chief Complaint  Patient presents with   Advice Only      Summer [name] is an 71 y.o. female.  HPI: Ms. Summer [name] is well-known to the clinic having been evaluated by 2 prior surgeons.  He was to be scheduled for a breast reduction and abdominoplasty.  These were delayed because of a previous small bowel obstruction and concerns that opioid use may lead to intra-abdominal distention.  She returns today to further discuss these procedures.  Of note the patient had radiation therapy for breast cancer on the right breast 2 years ago.  Allergies  Allergen Reactions   Keflex [Cephalexin] Anaphylaxis and Other (See Comments)    Caused violent dizziness and syncope   Clindamycin Hcl Itching    Outpatient Encounter Medications as of 07/13/2022  Medication Sig   anastrozole (ARIMIDEX) 1 MG tablet Take 1 tablet (1 mg total) by mouth daily. (Patient taking differently: Take 1 mg by mouth at bedtime.)   baclofen (LIORESAL) 10 MG tablet Take 1 tablet (10 mg total) by mouth 2 (two) times daily.   diclofenac Sodium (VOLTAREN) 1 % GEL Apply 2 g topically 4 (four) times daily.   DULoxetine (CYMBALTA) 60 MG capsule TAKE 1 CAPSULE BY MOUTH EVERY DAY   gabapentin (NEURONTIN) 600 MG tablet TAKE 2 TABLETS BY MOUTH 3 TIMES A DAY.   Melatonin 3 MG CAPS Take 1 capsule (3 mg total) by mouth at bedtime.   pravastatin (PRAVACHOL) 20 MG tablet Take 1 tablet (20 mg total) by mouth daily. (Patient taking differently: Take 20 mg by mouth every evening.)   No facility-administered encounter medications on file as of 07/13/2022.     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   Complication of anesthesia    Pt states having some "difficulty waking up"   Constipation 07/01/2018   Costochondritis 05/04/2014   Diverticulitis of colon 06/13/2007   S/p segmental colectomy for perforated diverticulitis    Diverticulosis    Facial pain 10/06/2015   Fibromyalgia    Foot sprain, left, initial encounter 08/31/2016   Glossitis 02/02/2016   Headache 12/07/5186   Helicobacter pylori gastritis 10/2020   HEMATURIA UNSPECIFIED 06/25/2008   Qualifier: Diagnosis of  By: Sherren Mocha MD, Dellis Filbert A    History of kidney stones    History of shingles 2012--  no residual pain   Hx of adenomatous colonic polyps 10/2020   Itching 10/13/2018   Left eye pain 07/01/2018   Left ureteral calculus    LOC OSTEOARTHROS NOT SPEC PRIM/SEC OTH [facility] SITE 06/27/2009   Qualifier: Diagnosis of  By: Sherren Mocha MD, Dellis Filbert A    Malignant neoplasm of upper-outer quadrant of right breast in female, estrogen receptor positive ([place]) 11/17/2018   MCI (mild cognitive impairment)    Memory loss 12/21/2016   Morbid obesity ([place]) 11/09/2010   Pain of right side of body 08/31/2016   Personal history of chemotherapy    Personal history of radiation  therapy    Pruritus 10/26/2016   Renal disorder    hx of   Right sided temporal headache 12/21/2016   Tremor 12/21/2016   Trigeminal neuralgia RIGHT   Trigeminal neuralgia of right side of face 11/30/2010   Trochanteric bursitis, right hip 10/26/2016   Urgency of urination    Weight loss 07/22/2017   Zoster without complications 7/82/9562    Past Surgical History:  Procedure Laterality Date   BREAST BIOPSY Right 11/11/2018   BREAST LUMPECTOMY Right 11/28/2018   BREAST LUMPECTOMY WITH RADIOACTIVE SEED AND SENTINEL LYMPH NODE BIOPSY Right 11/28/2018   Procedure: RIGHT BREAST LUMPECTOMY WITH RADIOACTIVE SEED AND RIGHT AXILLARY SENTINEL LYMPH NODE  BIOPSY;  Surgeon: Excell Seltzer, MD;  Location: [place];  Service: General;  Laterality: Right;   CEREBRAL MICROVASCULAR DECOMPRESSION  07-05-2006   RIGHT TRIGEMINAL NERVE   COLONOSCOPY  2013   CG-MAC-moviprep (exc)-normal -mild tics   CYSTOSCOPY/RETROGRADE/URETEROSCOPY/STONE EXTRACTION WITH BASKET  07/04/2012   Procedure: CYSTOSCOPY/RETROGRADE/URETEROSCOPY/STONE EXTRACTION WITH BASKET;  Surgeon: Claybon Jabs, MD;  Location: [facility];  Service: Urology;  Laterality: Left;   EXPLORATORY LAPAROTOMY/ RESECTION MID TO DISTAL SIGMOID AND PROXIMAL RECTUM/ END PROXIMAL SIGMOID COLOSTOMY  07-17-2006   PERFORATED DIVERTICULITIS WITH PERITONITIS   EXTRACORPOREAL SHOCK WAVE LITHOTRIPSY Left 01/19/2021   Procedure: LEFT EXTRACORPOREAL SHOCK WAVE LITHOTRIPSY (ESWL);  Surgeon: Bjorn Loser, MD;  Location: [facility];  Service: Urology;  Laterality: Left;   gamma knife  05/17/2016   for trigeminal neuralgia, [facility], Dr Salomon Fick   gamma knife  2019   IR CV LINE INJECTION  10/27/2019   PORTACATH PLACEMENT Left 11/28/2018   Procedure: INSERTION PORT-A-CATH WITH ULTRASOUND;  Surgeon: Excell Seltzer, MD;  Location: [place];  Service: General;  Laterality: Left;   portacath removal     RESECTION COLOSTOMY/ CLOSURE COLOSTOMY WITH COLOPROCTOSTOMY  02-20-2007   TONSILLECTOMY  AS CHILD   TOTAL KNEE ARTHROPLASTY Left 06/20/2020   Procedure: LEFT TOTAL KNEE ARTHROPLASTY;  Surgeon: Leandrew Koyanagi, MD;  Location: [place];  Service: Orthopedics;  Laterality: Left;   URETEROSCOPY  07/04/2012   Procedure: URETEROSCOPY;  Surgeon: Claybon Jabs, MD;  Location: [facility];  Service: Urology;  Laterality: Left;   VAGINAL HYSTERECTOMY  1998   Partial   WISDOM TOOTH EXTRACTION      Family History  Problem Relation Age of Onset   Heart disease Maternal Grandmother    Diabetes Mother    Breast cancer Maternal Aunt    Lupus Cousin    Colon cancer Neg Hx     Esophageal cancer Neg Hx    Rectal cancer Neg Hx    Stomach cancer Neg Hx    Colon polyps Neg Hx     Social History   Social History Narrative   Not on file     Review of Systems General: Denies fevers, chills, weight loss CV: Denies chest pain, shortness of breath, palpitations Breast: Has a large ptotic breast.  She feels that these are contributing to back pain. Abdomen: Patient has excess anterior abdominal wall skin and scars which she is not happy with.  Physical Exam    07/13/2022    2:44 PM 06/19/2022   10:26 AM 06/05/2022    3:55 PM  Vitals with BMI  Height _0  _1  _2   Weight 210 lbs 6 oz 209 lbs 205 lbs  BMI 33.98 13.08 65.7  Systolic 846 962 952  Diastolic 76 78 86  Pulse  88 76 74    General:  No acute distress,  Alert and oriented, Non-Toxic, Normal speech and affect Breast: Patient does have bilateral ptosis with with breast asymmetry due to her prior radiation therapy.  The right breast has all of the stigmata of radiation therapy with darkened retracted skin. Abdomen: The patient has anterior abdominal wall excess skin and fat as well as well-healed incisions in the midline below the umbilicus and a small incision just to the left of the midline incision.  On physical exam I cannot feel any fascial defects or other evidence of herniation. Mammogram: Mammography performed June 2023 was BI-RADS 2 Assessment/Plan Anterior abdominal wall skin and fat: Patient is requested abdominoplasty.  We discussed the procedure at length including the technique of the procedure location of the scars along the lower portion of the abdomen from iliac crest iliac crest and around the umbilicus.  We discussed the unpredictable nature of scarring.  Gust the risks of bleeding infection and seroma formation.  Gust the use of drains postoperatively and the physical limitations including no heavy lifting no vigorous exercise and no submerging the incisions in water for 6 weeks.  I told  her that the circumferential fat especially the fat on the flanks and the back would likely remain.  I would attempt to remove as much of the skin and fat as possible to leave her with the most aesthetically pleasing abdomen that I could.  Did and request that I proceed Breasts: I have not offered the patient a breast reduction as I feel that this is an unwise decision in the setting of her previous radiation therapy.  She understands.  She has asked for liposuction of the left breast I can probably reduce the breast a small amount with liposuction and we will submit this for consideration.  Camillia Herter 07/17/2022, 9:28 AM

## 2022-08-16 ENCOUNTER — Other Ambulatory Visit: Payer: Self-pay

## 2022-08-16 DIAGNOSIS — D509 Iron deficiency anemia, unspecified: Secondary | ICD-10-CM

## 2022-08-16 DIAGNOSIS — Z170 Estrogen receptor positive status [ER+]: Secondary | ICD-10-CM

## 2022-08-16 NOTE — Progress Notes (Signed)
[facility]   [phone] [phone]   Clinic Follow up Note   Patient Care Team: Default, Provider, MD as PCP - General Sueanne Margarita, MD as PCP - Cardiology (Cardiology) Mauro Kaufmann, RN as Oncology Nurse Navigator Rockwell Germany, RN as Oncology Nurse Navigator Excell Seltzer, MD (Inactive) as Consulting Physician (General Surgery) Truitt Merle, MD as Consulting Physician (Hematology) Gery Pray, MD as Consulting Physician (Radiation Oncology) Alla Feeling, NP as Nurse Practitioner (Nurse Practitioner)  Date of Service:  08/17/2022  CHIEF COMPLAINT: f/u of right breast cancer   CURRENT THERAPY:  -IV iron as needed  -Exemestane 35m daily starting 06/29/19   ASSESSMENT:  Summer [name] a 71y.o. female with   Malignant neoplasm of upper-outer quadrant of right breast in female, estrogen receptor positive ([place] Stage IA, pT1b,N0,M0, ER/PR+, HER2+, Grade II   --She was diagnosed in 11/2018. She is s/p right breast lumpectomy and SLNB  and adjuvant radiation. She was treated with adjuvant chemo (poorly tolerated Taxol and Herceptin, d/c after 6 weeks), Kadcyla for 1 month, then Herceptin only until 12/2019.  -I started her on antiestrogen therapy with Exemestane in 06/2019. She has tolerated moderately due to known joint pain worsening in shoulders and knees.  Exemestane was changed to anastrozole in June 2023, she is tolerating well.  Will continue.  -She is clinically doing well, lab reviewed, exam was unremarkable, no clinical concern for recurrence. -She is not happy with the asymmetry of her breasts, would like to do breast reductions.  She has seen 2 plastic surgeons, not happy with the recommendations.  I will refer her to see Dr. TIran Planas  Osteopenia Her baseline DEXA from 07/2019 shows osteopenia with lowest T-score -1.1 at left hip.  -Will continue to monitor on AI as this can weaken her bones. Repeat every 2  years.  -I recommend she start OTC calcium and Vitamin D.  -DEXA 01/31/2022 showed lowest T score -1.8 at the forearm radius; BMD had also decreased at the left femur neck from -1.1 to -1.7, overall low FRAX score. -I encouraged her to continue oral calcium and vitamin D, and weightbearing exercise.  I also encouraged her to consider Zometa infusion every 6 months for 2 years, potential benefit and side effect discussed with her, she declined.  Peripheral neuropathy -Likely secondary to chemotherapy, most in her feet -She has been seeing neurology for right trigeminal neuralgia, she is on Cymbalta and high-dose gabapentin -Continue monitoring.     PLAN: -lab reviewed -referral Plastic Surgeon Dr. TIran Planas-Discuss Zometa, she declined. -Continue Anastrazole -lab/,f/u in 685ms.   SUMMARY OF ONCOLOGIC HISTORY: Oncology History Overview Note  Cancer Staging Malignant neoplasm of upper-outer quadrant of right breast in female, estrogen receptor positive ([place]: Breast, AJCC 8th Edition - Clinical stage from 11/10/2018: Stage IA (cT1b, cN0, cM0, G2, ER+, PR+, HER2+) - Signed by FeTruitt MerleMD on 11/18/2018     Malignant neoplasm of upper-outer quadrant of right breast in female, estrogen receptor positive ([place] 11/07/2018 Mammogram   Diagnostic 11/07/18 IMPRESSION: 1. 1 x 0.7 x 0.6 cm hypoechoic mass with distortion at the 9-9:30 position of the RIGHT breast 6 cm from the nipple with distortion in the UPPER-OUTER RIGHT breast, corresponding to the mammographic abnormality. One RIGHT axillary lymph node with borderline cortical thickness. Tissue sampling of the RIGHT breast mass and borderline RIGHT axillary lymph node recommended. ADDENDUM: Patient returned today for biopsy of a single borderline thickened  lymph node in the RIGHT axilla. I could not reproduce a morphologically abnormal lymph node today, possibly resolved in the interval. Today, only normal appearing  lymph nodes were identified in the RIGHT axilla. As such, the axillary ultrasound-guided biopsy was canceled.   11/10/2018 Cancer Staging   Staging form: Breast, AJCC 8th Edition - Clinical stage from 11/10/2018: Stage IA (cT1b, cN0, cM0, G2, ER+, PR+, HER2+) - Signed by Truitt Merle, MD on 11/18/2018   11/11/2018 Initial Biopsy   Diagnosis 11/11/18 Breast, right, needle core biopsy, upper outer - 9:30 o'clock position - INVASIVE DUCTAL CARCINOMA, GRADE II/III. - SEE MICROSCOPIC DESCRIPTION.   11/11/2018 Receptors her2   Results: IMMUNOHISTOCHEMICAL AND MORPHOMETRIC ANALYSIS PERFORMED MANUALLY The tumor cells are POSITIVE for Her2 (3+). Estrogen Receptor: 95%, POSITIVE, STRONG STAINING INTENSITY Progesterone Receptor: 5%, POSITIVE, STRONG STAINING INTENSITY Proliferation Marker Ki67: 20%   11/17/2018 Initial Diagnosis   Malignant neoplasm of upper-outer quadrant of right breast in female, estrogen receptor positive ([place])   11/28/2018 Surgery   RIGHT BREAST LUMPECTOMY WITH RADIOACTIVE SEED AND RIGHT AXILLARY SENTINEL LYMPH NODE BIOPSY by Dr. Excell Seltzer  11/28/18    11/28/2018 Pathology Results   Diagnosis 11/28/18  1. Breast, lumpectomy, Right w/seed - INVASIVE DUCTAL CARCINOMA, 1.6 CM, [place] GRADE 2 OF 3. - MARGINS OF RESECTION ARE NOT INVOLVED (CLOSEST MARGIN: LESS THAN 1 MM, ANTERIOR). - DUCTAL CARCINOMA IN SITU. - BIOPSY SITE CHANGES. - SEE ONCOLOGY TABLE. 2. Lymph node, sentinel, biopsy, right Axillary - ONE LYMPH NODE, NEGATIVE FOR CARCINOMA (0/1). 3. Lymph node, sentinel, biopsy, right Axillary - ONE LYMPH NODE, NEGATIVE FOR CARCINOMA (0/1).   11/28/2018 Cancer Staging   Staging form: Breast, AJCC 8th Edition - Pathologic stage from 11/28/2018: Stage IA (pT1c, pN0, cM0, G2, ER+, PR+, HER2+) - Signed by Truitt Merle, MD on 12/11/2018   12/22/2018 Procedure   Baseline ECHO 12/22/18  IMPRESSIONS  1. The left ventricle has normal systolic function with an ejection fraction of 60-65%. The  cavity size was normal. There is mild concentric left ventricular hypertrophy. Left ventricular diastolic parameters were normal.  2. The right ventricle has normal systolic function. The cavity was normal. There is no increase in right ventricular wall thickness.  3. Left atrial size was moderately dilated.  4. The aortic valve is tricuspid. Aortic valve regurgitation was not assessed by color flow Doppler.    01/02/2019 - 02/12/2020 Chemotherapy   Adjuvant chemo weekly Taxol with Herceptin starting 01/02/19. Will d/c after 02/13/19 and will switch to Kadcyla q3weeks starting 02/20/19 due to worsening neuropathy. Due to worsening Muscle cramps Kadcyla stopped 04/03/19 and switched to Herceptin q3weeks on 04/23/19. Completed on 02/12/20   04/09/2019 - 05/28/2019 Radiation Therapy   RT with Dr. Sondra Come 04/09/19-05/28/19   06/29/2019 -  Anti-estrogen oral therapy   Exemestane 22m daily starting 06/29/19   04/28/2020 Survivorship   SCP delivered by LCira Rue NP      INTERVAL HISTORY:  CLauryl Seyeris here for a follow up of  right breast cancer She was last seen by me on 08/17/2022 She presents to the clinic alone. Pt stated she is a little depress. She stated she is having tingly and burning in her feet.She states she has gain weight. She states she "feels down" about how her breast look.    All other systems were reviewed with the patient and are negative.  MEDICAL HISTORY:  Past Medical History:  Diagnosis Date   Allergic rhinitis 08/26/2008   Qualifier: Diagnosis of  By:  Sherren Mocha MD, Dellis Filbert A    Anemia, iron deficiency 11/21/2018   Arthritis shoulders and back   BACK PAIN, CHRONIC 06/13/2007   Qualifier: Diagnosis of  By: Sherren Mocha MD, Dellis Filbert A    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   Cataract    bilateral - no sx as of 09/23/2020)   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   Complication of anesthesia    Pt states having some "difficulty waking up"   Constipation 07/01/2018    Costochondritis 05/04/2014   Diverticulitis of colon 06/13/2007   S/p segmental colectomy for perforated diverticulitis    Diverticulosis    Facial pain 10/06/2015   Fibromyalgia    Foot sprain, left, initial encounter 08/31/2016   Glossitis 02/02/2016   Headache 2/77/4128   Helicobacter pylori gastritis 10/2020   HEMATURIA UNSPECIFIED 06/25/2008   Qualifier: Diagnosis of  By: Sherren Mocha MD, Dellis Filbert A    History of kidney stones    History of shingles 2012--  no residual pain   Hx of adenomatous colonic polyps 10/2020   Itching 10/13/2018   Left eye pain 07/01/2018   Left ureteral calculus    LOC OSTEOARTHROS NOT SPEC PRIM/SEC OTH [facility] SITE 06/27/2009   Qualifier: Diagnosis of  By: Sherren Mocha MD, Dellis Filbert A    Malignant neoplasm of upper-outer quadrant of right breast in female, estrogen receptor positive ([place]) 11/17/2018   MCI (mild cognitive impairment)    Memory loss 12/21/2016   Morbid obesity ([place]) 11/09/2010   Pain of right side of body 08/31/2016   Personal history of chemotherapy    Personal history of radiation therapy    Pruritus 10/26/2016   Renal disorder    hx of   Right sided temporal headache 12/21/2016   Tremor 12/21/2016   Trigeminal neuralgia RIGHT   Trigeminal neuralgia of right side of face 11/30/2010   Trochanteric bursitis, right hip 10/26/2016   Urgency of urination    Weight loss 07/22/2017   Zoster without complications 7/86/7672    SURGICAL HISTORY: Past Surgical History:  Procedure Laterality Date   BREAST BIOPSY Right 11/11/2018   BREAST LUMPECTOMY Right 11/28/2018   BREAST LUMPECTOMY WITH RADIOACTIVE SEED AND SENTINEL LYMPH NODE BIOPSY Right 11/28/2018   Procedure: RIGHT BREAST LUMPECTOMY WITH RADIOACTIVE SEED AND RIGHT AXILLARY SENTINEL LYMPH NODE BIOPSY;  Surgeon: Excell Seltzer, MD;  Location: [place];  Service: General;  Laterality: Right;   CEREBRAL MICROVASCULAR DECOMPRESSION  07-05-2006   RIGHT TRIGEMINAL NERVE   COLONOSCOPY  2013   CG-MAC-moviprep  (exc)-normal -mild tics   CYSTOSCOPY/RETROGRADE/URETEROSCOPY/STONE EXTRACTION WITH BASKET  07/04/2012   Procedure: CYSTOSCOPY/RETROGRADE/URETEROSCOPY/STONE EXTRACTION WITH BASKET;  Surgeon: Claybon Jabs, MD;  Location: [facility];  Service: Urology;  Laterality: Left;   EXPLORATORY LAPAROTOMY/ RESECTION MID TO DISTAL SIGMOID AND PROXIMAL RECTUM/ END PROXIMAL SIGMOID COLOSTOMY  07-17-2006   PERFORATED DIVERTICULITIS WITH PERITONITIS   EXTRACORPOREAL SHOCK WAVE LITHOTRIPSY Left 01/19/2021   Procedure: LEFT EXTRACORPOREAL SHOCK WAVE LITHOTRIPSY (ESWL);  Surgeon: Bjorn Loser, MD;  Location: [facility];  Service: Urology;  Laterality: Left;   gamma knife  05/17/2016   for trigeminal neuralgia, [facility], Dr Salomon Fick   gamma knife  2019   IR CV LINE INJECTION  10/27/2019   PORTACATH PLACEMENT Left 11/28/2018   Procedure: INSERTION PORT-A-CATH WITH ULTRASOUND;  Surgeon: Excell Seltzer, MD;  Location: [place];  Service: General;  Laterality: Left;   portacath removal     RESECTION COLOSTOMY/ CLOSURE COLOSTOMY WITH COLOPROCTOSTOMY  02-20-2007  TONSILLECTOMY  AS CHILD   TOTAL KNEE ARTHROPLASTY Left 06/20/2020   Procedure: LEFT TOTAL KNEE ARTHROPLASTY;  Surgeon: Leandrew Koyanagi, MD;  Location: [place];  Service: Orthopedics;  Laterality: Left;   URETEROSCOPY  07/04/2012   Procedure: URETEROSCOPY;  Surgeon: Claybon Jabs, MD;  [facility];  Service: Urology;  Laterality: Left;   VAGINAL HYSTERECTOMY  1998   Partial   WISDOM TOOTH EXTRACTION      I have reviewed the social history and family history with the patient and they are unchanged from previous note.  ALLERGIES:  is allergic to keflex [cephalexin] and clindamycin hcl.  MEDICATIONS:  Current Outpatient Medications  Medication Sig Dispense Refill   anastrozole (ARIMIDEX) 1 MG tablet Take 1 tablet (1 mg total) by mouth daily. (Patient taking differently: Take 1 mg by mouth at  bedtime.) 90 tablet 3   baclofen (LIORESAL) 10 MG tablet Take 1 tablet (10 mg total) by mouth 2 (two) times daily. 180 tablet 3   diclofenac Sodium (VOLTAREN) 1 % GEL Apply 2 g topically 4 (four) times daily. 150 g 0   DULoxetine (CYMBALTA) 60 MG capsule TAKE 1 CAPSULE BY MOUTH EVERY DAY 90 capsule 3   gabapentin (NEURONTIN) 600 MG tablet TAKE 2 TABLETS BY MOUTH 3 TIMES A DAY. 540 tablet 3   Melatonin 3 MG CAPS Take 1 capsule (3 mg total) by mouth at bedtime.  0   pravastatin (PRAVACHOL) 20 MG tablet Take 1 tablet (20 mg total) by mouth daily. (Patient taking differently: Take 20 mg by mouth every evening.) 90 tablet 3   No current facility-administered medications for this visit.    PHYSICAL EXAMINATION: ECOG PERFORMANCE STATUS: 1 - Symptomatic but completely ambulatory  Vitals:   08/17/22 1523  BP: 138/67  Pulse: 71  Resp: 15  Temp: 98.2 F (36.8 C)  SpO2: 100%   Wt Readings from Last 3 Encounters:  08/17/22 212 lb 14.4 oz (96.6 kg)  07/13/22 210 lb 6.4 oz (95.4 kg)  06/19/22 209 lb (94.8 kg)    GENERAL:alert, no distress and comfortable SKIN: skin color, texture, turgor are normal, no rashes or significant lesions EYES: normal, Conjunctiva are pink and non-injected, sclera clear NECK: supple, thyroid normal size, non-tender, without nodularity LYMPH:  no palpable lymphadenopathy in the cervical, axillary LUNGS: clear to auscultation and percussion with normal breathing effort HEART: regular rate & rhythm and no murmurs and no lower extremity edema ABDOMEN:abdomen soft, non-tender and normal bowel sounds Musculoskeletal:no cyanosis of digits and no clubbing  NEURO: alert & oriented x 3 with fluent speech, no focal motor/sensory deficits BREAST: Right breast, nipple inverted.No palpable mass, nodules or adenopathy bilaterally. Breast exam benign.       LABORATORY DATA:  I have reviewed the data as listed    Latest Ref Rng & Units 08/17/2022    2:56 PM 04/14/2022    4:32  AM 04/12/2022   11:13 PM  CBC  WBC 4.0 - 10.5 K/uL 4.9  6.3  7.7   Hemoglobin 12.0 - 15.0 g/dL 14.2  12.9  14.6   Hematocrit 36.0 - 46.0 % 43.4  41.0  45.3   Platelets 150 - 400 K/uL 240  231  252         Latest Ref Rng & Units 08/17/2022    2:56 PM 04/15/2022    5:10 AM 04/14/2022    4:32 AM  CMP  Glucose 70 - 99 mg/dL 88  90  89  BUN 8 - 23 mg/dL _0 Creatinine 0.44 - 1.00 mg/dL 0.71  0.64  0.56   Sodium 135 - 145 mmol/L 141  139  138   Potassium 3.5 - 5.1 mmol/L 3.9  3.3  3.2   Chloride 98 - 111 mmol/L 104  106  99   CO2 22 - 32 mmol/L _1 Calcium 8.9 - 10.3 mg/dL 9.9  9.0  9.0   Total Protein 6.5 - 8.1 g/dL 7.2   6.6   Total Bilirubin 0.3 - 1.2 mg/dL 1.0   1.5   Alkaline Phos 38 - 126 U/L 108   94   AST 15 - 41 U/L 17   20   ALT 0 - 44 U/L 7   12       RADIOGRAPHIC STUDIES: I have personally reviewed the radiological images as listed and agreed with the findings in the report. No results found.    Orders Placed This Encounter  Procedures   Ambulatory referral to Plastic Surgery    Referral Priority:   Routine    Referral Type:   Surgical    Referral Reason:   Specialty Services Required    Requested Specialty:   Plastic Surgery    Number of Visits Requested:   1   All questions were answered. The patient knows to call the clinic with any problems, questions or concerns. No barriers to learning was detected. The total time spent in the appointment was 30 minutes.     Truitt Merle, MD 08/17/2022   Felicity Coyer, CMA, am acting as scribe for Truitt Merle, MD.   I have reviewed the above documentation for accuracy and completeness, and I agree with the above.

## 2022-08-17 ENCOUNTER — Other Ambulatory Visit: Payer: Self-pay

## 2022-08-17 ENCOUNTER — Inpatient Hospital Stay: Payer: Federal, State, Local not specified - PPO

## 2022-08-17 ENCOUNTER — Inpatient Hospital Stay: Payer: Federal, State, Local not specified - PPO | Attending: Hematology | Admitting: Hematology

## 2022-08-17 ENCOUNTER — Encounter: Payer: Self-pay | Admitting: Hematology

## 2022-08-17 VITALS — BP 138/67 | HR 71 | Temp 98.2°F | Resp 15 | Ht 66.0 in | Wt 212.9 lb

## 2022-08-17 DIAGNOSIS — M85852 Other specified disorders of bone density and structure, left thigh: Secondary | ICD-10-CM | POA: Diagnosis not present

## 2022-08-17 DIAGNOSIS — C50411 Malignant neoplasm of upper-outer quadrant of right female breast: Secondary | ICD-10-CM

## 2022-08-17 DIAGNOSIS — Z9221 Personal history of antineoplastic chemotherapy: Secondary | ICD-10-CM | POA: Diagnosis not present

## 2022-08-17 DIAGNOSIS — Z923 Personal history of irradiation: Secondary | ICD-10-CM | POA: Diagnosis not present

## 2022-08-17 DIAGNOSIS — M8580 Other specified disorders of bone density and structure, unspecified site: Secondary | ICD-10-CM

## 2022-08-17 DIAGNOSIS — Z170 Estrogen receptor positive status [ER+]: Secondary | ICD-10-CM | POA: Diagnosis not present

## 2022-08-17 DIAGNOSIS — Z79811 Long term (current) use of aromatase inhibitors: Secondary | ICD-10-CM | POA: Insufficient documentation

## 2022-08-17 DIAGNOSIS — G629 Polyneuropathy, unspecified: Secondary | ICD-10-CM | POA: Insufficient documentation

## 2022-08-17 DIAGNOSIS — G6289 Other specified polyneuropathies: Secondary | ICD-10-CM

## 2022-08-17 DIAGNOSIS — D509 Iron deficiency anemia, unspecified: Secondary | ICD-10-CM

## 2022-08-17 LAB — CMP (CANCER CENTER ONLY)
ALT: 7 U/L (ref 0–44)
AST: 17 U/L (ref 15–41)
Albumin: 4.2 g/dL (ref 3.5–5.0)
Alkaline Phosphatase: 108 U/L (ref 38–126)
Anion gap: 6 (ref 5–15)
BUN: 11 mg/dL (ref 8–23)
CO2: 31 mmol/L (ref 22–32)
Calcium: 9.9 mg/dL (ref 8.9–10.3)
Chloride: 104 mmol/L (ref 98–111)
Creatinine: 0.71 mg/dL (ref 0.44–1.00)
GFR, Estimated: >60 mL/min (ref >60)
Glucose, Bld: 88 mg/dL (ref 70–99)
Potassium: 3.9 mmol/L (ref 3.5–5.1)
Sodium: 141 mmol/L (ref 135–145)
Total Bilirubin: 1 mg/dL (ref 0.3–1.2)
Total Protein: 7.2 g/dL (ref 6.5–8.1)

## 2022-08-17 LAB — IRON AND IRON BINDING CAPACITY (CC-WL,HP ONLY)
Iron: 142 ug/dL (ref 28–170)
Saturation Ratios: 41 % — ABNORMAL HIGH (ref 10.4–31.8)
TIBC: 349 ug/dL (ref 250–450)
UIBC: 207 ug/dL (ref 148–442)

## 2022-08-17 LAB — CBC WITH DIFFERENTIAL (CANCER CENTER ONLY)
Abs Immature Granulocytes: 0.05 10*3/uL (ref 0.00–0.07)
Basophils Absolute: 0 10*3/uL (ref 0.0–0.1)
Basophils Relative: 1 %
Eosinophils Absolute: 0.1 10*3/uL (ref 0.0–0.5)
Eosinophils Relative: 3 %
HCT: 43.4 % (ref 36.0–46.0)
Hemoglobin: 14.2 g/dL (ref 12.0–15.0)
Immature Granulocytes: 1 %
Lymphocytes Relative: 38 %
Lymphs Abs: 1.9 10*3/uL (ref 0.7–4.0)
MCH: 29.5 pg (ref 26.0–34.0)
MCHC: 32.7 g/dL (ref 30.0–36.0)
MCV: 90.2 fL (ref 80.0–100.0)
Monocytes Absolute: 0.4 10*3/uL (ref 0.1–1.0)
Monocytes Relative: 8 %
Neutro Abs: 2.4 10*3/uL (ref 1.7–7.7)
Neutrophils Relative %: 49 %
Platelet Count: 240 10*3/uL (ref 150–400)
RBC: 4.81 MIL/uL (ref 3.87–5.11)
RDW: 12.9 % (ref 11.5–15.5)
WBC Count: 4.9 10*3/uL (ref 4.0–10.5)
nRBC: 0 % (ref 0.0–0.2)

## 2022-08-17 LAB — FERRITIN: Ferritin: 51 ng/mL (ref 11–307)

## 2022-08-17 NOTE — Assessment & Plan Note (Addendum)
Stage IA, pT1b,N0,M0, ER/PR+, HER2+, Grade II   --She was diagnosed in 11/2018. She is s/p right breast lumpectomy and SLNB  and adjuvant radiation. She was treated with adjuvant chemo (poorly tolerated Taxol and Herceptin, d/c after 6 weeks), Kadcyla for 1 month, then Herceptin only until 12/2019.  -I started her on antiestrogen therapy with Exemestane in 06/2019. She has tolerated moderately due to known joint pain worsening in shoulders and knees.  Exemestane was changed to anastrozole in June 2023, she is tolerating well.  Will continue.  -She is clinically doing well, lab reviewed, exam was unremarkable, no clinical concern for recurrence. -She is not happy with the asymmetry of her breasts, would like to do breast reductions.  She has seen 2 plastic surgeons, not happy with the recommendations.  I will refer her to see Dr. Iran Planas.

## 2022-08-17 NOTE — Assessment & Plan Note (Signed)
-  Likely secondary to chemotherapy, most in her feet -She has been seeing neurology for right trigeminal neuralgia, she is on Cymbalta and high-dose gabapentin -Continue monitoring.

## 2022-08-17 NOTE — Assessment & Plan Note (Addendum)
Her baseline DEXA from 07/2019 shows osteopenia with lowest T-score -1.1 at left hip.  -Will continue to monitor on AI as this can weaken her bones. Repeat every 2 years.  -I recommend she start OTC calcium and Vitamin D.  -DEXA 01/31/2022 showed lowest T score -1.8 at the forearm radius; BMD had also decreased at the left femur neck from -1.1 to -1.7, overall low FRAX score. -I encouraged her to continue oral calcium and vitamin D, and weightbearing exercise.  I also encouraged her to consider Zometa infusion every 6 months for 2 years, potential benefit and side effect discussed with her, she declined.

## 2022-08-18 LAB — VITAMIN D 25 HYDROXY (VIT D DEFICIENCY, FRACTURES): Vit D, 25-Hydroxy: 42.75 ng/mL (ref 30–100)

## 2022-08-30 ENCOUNTER — Ambulatory Visit: Payer: Federal, State, Local not specified - PPO | Admitting: Internal Medicine

## 2022-08-30 ENCOUNTER — Encounter: Payer: Self-pay | Admitting: Internal Medicine

## 2022-08-30 VITALS — BP 140/80 | HR 72 | Temp 98.3°F | Ht 66.0 in | Wt 212.0 lb

## 2022-08-30 DIAGNOSIS — N6489 Other specified disorders of breast: Secondary | ICD-10-CM

## 2022-08-30 DIAGNOSIS — G500 Trigeminal neuralgia: Secondary | ICD-10-CM

## 2022-08-30 DIAGNOSIS — H532 Diplopia: Secondary | ICD-10-CM | POA: Diagnosis not present

## 2022-08-30 DIAGNOSIS — E782 Mixed hyperlipidemia: Secondary | ICD-10-CM | POA: Diagnosis not present

## 2022-08-30 NOTE — Assessment & Plan Note (Signed)
Needs referral to plastic surgery which is done. Due to breast cancer surgery and wishes to address.

## 2022-08-30 NOTE — Progress Notes (Signed)
   Subjective:   Patient ID: Summer [name], female    DOB: 1950/12/02, 71 y.o.   MRN: [id number]  HPI The patient is a 71 YO female coming in for problems with eyes for about a month with dizziness, head pain behind eyes, focus problems. This all started same time acutely. Stable since that time. Saw eye doctor yesterday and they did not find any problems. Cataract April both eyes and laser procedure months ago.   Review of Systems  Constitutional: Negative.   HENT: Negative.    Eyes:  Positive for visual disturbance.  Respiratory:  Negative for cough, chest tightness and shortness of breath.   Cardiovascular:  Negative for chest pain, palpitations and leg swelling.  Gastrointestinal:  Negative for abdominal distention, abdominal pain, constipation, diarrhea, nausea and vomiting.  Musculoskeletal:  Positive for arthralgias and back pain.  Skin: Negative.   Neurological:  Positive for numbness and headaches.  Psychiatric/Behavioral: Negative.      Objective:  Physical Exam Constitutional:      Appearance: She is well-developed.  HENT:     Head: Normocephalic and atraumatic.  Eyes:     Pupils: Pupils are equal, round, and reactive to light.  Cardiovascular:     Rate and Rhythm: Normal rate and regular rhythm.  Pulmonary:     Effort: Pulmonary effort is normal. No respiratory distress.     Breath sounds: Normal breath sounds. No wheezing or rales.  Abdominal:     General: Bowel sounds are normal. There is no distension.     Palpations: Abdomen is soft.     Tenderness: There is no abdominal tenderness. There is no rebound.  Musculoskeletal:     Cervical back: Normal range of motion.  Skin:    General: Skin is warm and dry.  Neurological:     Mental Status: She is alert and oriented to person, place, and time.     Sensory: Sensory deficit present.     Coordination: Coordination normal.    Vitals:   08/30/22 0808  BP: (!) 140/80  Pulse: 72  Temp: 98.3 F (36.8 C)   TempSrc: Oral  SpO2: 95%  Weight: 212 lb (96.2 kg)  Height: '5\' 6"'$  (1.676 m)    Assessment & Plan:

## 2022-08-30 NOTE — Assessment & Plan Note (Signed)
Acutely about 1 month ago she developed intermittent diplopia and focus difficulty with pain behind right eye and dizziness. I am concerned about stroke versus worsening of trigeminal neuralgia versus cancer (recent breast cancer). Ordered CT to rule out. Will treat as appropriate.

## 2022-08-30 NOTE — Patient Instructions (Signed)
We will check the labs and the CT of the head.

## 2022-08-30 NOTE — Assessment & Plan Note (Signed)
She states that the new pain behind eye and focus problems are not consistent with prior flare of trigeminal neuralgia. We need CT head to rule out change which is ordered today. She is concerned about weight gain and counseled that the gabapentin 600 mg TID can be associated with weight gain.

## 2022-08-30 NOTE — Assessment & Plan Note (Signed)
Checking lipid panel as no recheck since starting pravastatin 20 mg daily. Adjust as needed goal <100 LDL

## 2022-09-07 ENCOUNTER — Ambulatory Visit
Admission: RE | Admit: 2022-09-07 | Discharge: 2022-09-07 | Disposition: A | Discharge status: Home / Self Care | Payer: Federal, State, Local not specified - PPO | Source: Ambulatory Visit | Attending: Internal Medicine | Admitting: Internal Medicine

## 2022-09-07 DIAGNOSIS — H532 Diplopia: Secondary | ICD-10-CM

## 2022-10-16 ENCOUNTER — Other Ambulatory Visit: Payer: Self-pay | Admitting: Internal Medicine

## 2022-12-18 ENCOUNTER — Other Ambulatory Visit: Payer: Self-pay | Admitting: Nurse Practitioner

## 2023-02-20 ENCOUNTER — Telehealth: Payer: Self-pay | Admitting: Hematology

## 2023-02-21 DIAGNOSIS — R92323 Mammographic fibroglandular density, bilateral breasts: Secondary | ICD-10-CM | POA: Diagnosis not present

## 2023-02-21 DIAGNOSIS — Z1231 Encounter for screening mammogram for malignant neoplasm of breast: Secondary | ICD-10-CM | POA: Diagnosis not present

## 2023-02-21 LAB — HM MAMMOGRAPHY

## 2023-02-22 ENCOUNTER — Encounter: Payer: Self-pay | Admitting: Internal Medicine

## 2023-02-28 ENCOUNTER — Encounter: Payer: Self-pay | Admitting: Internal Medicine

## 2023-03-08 ENCOUNTER — Other Ambulatory Visit: Payer: Federal, State, Local not specified - PPO

## 2023-03-08 ENCOUNTER — Ambulatory Visit: Payer: Federal, State, Local not specified - PPO | Admitting: Adult Health

## 2023-03-29 ENCOUNTER — Inpatient Hospital Stay: Payer: Federal, State, Local not specified - PPO | Attending: Hematology

## 2023-03-29 ENCOUNTER — Inpatient Hospital Stay: Payer: Federal, State, Local not specified - PPO | Admitting: Hematology

## 2023-03-29 VITALS — BP 145/79 | HR 66 | Temp 98.8°F | Ht 66.0 in | Wt 231.3 lb

## 2023-03-29 DIAGNOSIS — Z170 Estrogen receptor positive status [ER+]: Secondary | ICD-10-CM

## 2023-03-29 DIAGNOSIS — Z789 Other specified health status: Secondary | ICD-10-CM | POA: Diagnosis not present

## 2023-03-29 DIAGNOSIS — M8580 Other specified disorders of bone density and structure, unspecified site: Secondary | ICD-10-CM

## 2023-03-29 DIAGNOSIS — D509 Iron deficiency anemia, unspecified: Secondary | ICD-10-CM

## 2023-03-29 DIAGNOSIS — R232 Flushing: Secondary | ICD-10-CM | POA: Insufficient documentation

## 2023-03-29 DIAGNOSIS — Z923 Personal history of irradiation: Secondary | ICD-10-CM | POA: Insufficient documentation

## 2023-03-29 DIAGNOSIS — Z79811 Long term (current) use of aromatase inhibitors: Secondary | ICD-10-CM | POA: Insufficient documentation

## 2023-03-29 DIAGNOSIS — G629 Polyneuropathy, unspecified: Secondary | ICD-10-CM | POA: Diagnosis not present

## 2023-03-29 DIAGNOSIS — M85852 Other specified disorders of bone density and structure, left thigh: Secondary | ICD-10-CM | POA: Diagnosis not present

## 2023-03-29 DIAGNOSIS — Z9221 Personal history of antineoplastic chemotherapy: Secondary | ICD-10-CM | POA: Insufficient documentation

## 2023-03-29 DIAGNOSIS — C50411 Malignant neoplasm of upper-outer quadrant of right female breast: Secondary | ICD-10-CM

## 2023-03-29 LAB — CBC WITH DIFFERENTIAL (CANCER CENTER ONLY)
Abs Immature Granulocytes: 0.04 10*3/uL (ref 0.00–0.07)
Basophils Absolute: 0.1 10*3/uL (ref 0.0–0.1)
Basophils Relative: 1 %
Eosinophils Absolute: 0.2 10*3/uL (ref 0.0–0.5)
Eosinophils Relative: 2 %
HCT: 40.9 % (ref 36.0–46.0)
Hemoglobin: 13.4 g/dL (ref 12.0–15.0)
Immature Granulocytes: 1 %
Lymphocytes Relative: 37 %
Lymphs Abs: 2.4 10*3/uL (ref 0.7–4.0)
MCH: 29.5 pg (ref 26.0–34.0)
MCHC: 32.8 g/dL (ref 30.0–36.0)
MCV: 90.1 fL (ref 80.0–100.0)
Monocytes Absolute: 0.5 10*3/uL (ref 0.1–1.0)
Monocytes Relative: 7 %
Neutro Abs: 3.2 10*3/uL (ref 1.7–7.7)
Neutrophils Relative %: 52 %
Platelet Count: 276 10*3/uL (ref 150–400)
RBC: 4.54 MIL/uL (ref 3.87–5.11)
RDW: 13.1 % (ref 11.5–15.5)
WBC Count: 6.3 10*3/uL (ref 4.0–10.5)
nRBC: 0 % (ref 0.0–0.2)

## 2023-03-29 LAB — CMP (CANCER CENTER ONLY)
ALT: 7 U/L (ref 0–44)
AST: 15 U/L (ref 15–41)
Albumin: 4 g/dL (ref 3.5–5.0)
Alkaline Phosphatase: 94 U/L (ref 38–126)
Anion gap: 6 (ref 5–15)
BUN: 10 mg/dL (ref 8–23)
CO2: 29 mmol/L (ref 22–32)
Calcium: 9.4 mg/dL (ref 8.9–10.3)
Chloride: 105 mmol/L (ref 98–111)
Creatinine: 0.73 mg/dL (ref 0.44–1.00)
GFR, Estimated: >60 mL/min (ref >60)
Glucose, Bld: 86 mg/dL (ref 70–99)
Potassium: 3.9 mmol/L (ref 3.5–5.1)
Sodium: 140 mmol/L (ref 135–145)
Total Bilirubin: 0.5 mg/dL (ref 0.3–1.2)
Total Protein: 7.1 g/dL (ref 6.5–8.1)

## 2023-03-29 LAB — VITAMIN D 25 HYDROXY (VIT D DEFICIENCY, FRACTURES): Vit D, 25-Hydroxy: 28.53 ng/mL — ABNORMAL LOW (ref 30–100)

## 2023-03-29 LAB — IRON AND IRON BINDING CAPACITY (CC-WL,HP ONLY)
Iron: 108 ug/dL (ref 28–170)
Saturation Ratios: 34 % — ABNORMAL HIGH (ref 10.4–31.8)
TIBC: 319 ug/dL (ref 250–450)
UIBC: 211 ug/dL (ref 148–442)

## 2023-03-29 LAB — FERRITIN: Ferritin: 61 ng/mL (ref 11–307)

## 2023-03-29 MED ORDER — VENLAFAXINE HCL ER 37.5 MG PO CP24: 37.5000 mg | ORAL_CAPSULE | Freq: Every day | ORAL | 1 refills | Status: DC

## 2023-03-29 NOTE — Progress Notes (Signed)
[facility]   [phone] [phone]   Clinic Follow up Note   Patient Care Team: Myrlene Broker, MD as PCP - General (Internal Medicine) Quintella Reichert, MD as PCP - Cardiology (Cardiology) Pershing Proud, RN as Oncology Nurse Navigator Donnelly Angelica, RN as Oncology Nurse Navigator Glenna Fellows, MD (Inactive) as Consulting Physician (General Surgery) Malachy Mood, MD as Consulting Physician (Hematology) Antony Blackbird, MD as Consulting Physician (Radiation Oncology) Pollyann Samples, NP as Nurse Practitioner (Nurse Practitioner)  Date of Service:  03/29/2023  CHIEF COMPLAINT: f/u of  right breast cancer   CURRENT THERAPY:  -IV iron as needed  -Exemestane 25mg  daily starting 06/29/19  ASSESSMENT:  Summer [name] is a 71 y.o. female with    Malignant neoplasm of upper-outer quadrant of right breast in female, estrogen receptor positive (HCC) Stage IA, pT1b,N0,M0, ER/PR+, HER2+, Grade II   --She was diagnosed in 11/2018. She is s/p right breast lumpectomy and SLNB  and adjuvant radiation. She was treated with adjuvant chemo (poorly tolerated Taxol and Herceptin, d/c after 6 weeks), Kadcyla for 1 month, then Herceptin only until 12/2019.  -I started her on antiestrogen therapy with Exemestane in 06/2019. She has tolerated moderately due to known joint pain worsening in shoulders and knees.  Exemestane was changed to anastrozole in June 2023, she is tolerating well.  Will continue.  -She is clinically doing well, lab reviewed, exam was unremarkable, no clinical concern for recurrence. -She is not happy with the asymmetry of her breasts, would like to do breast reductions.  She has seen 3 plastic surgeons, not happy with the recommendations. She has decided not to pursue more surgery    Osteopenia Her baseline DEXA from 07/2019 shows osteopenia with lowest T-score -1.1 at left hip.  -Will continue to monitor on AI as this can weaken  her bones. Repeat every 2 years.  -I recommend she start OTC calcium and Vitamin D.  -DEXA 01/31/2022 showed lowest T score -1.8 at the forearm radius; BMD had also decreased at the left femur neck from -1.1 to -1.7, overall low FRAX score. -I encouraged her to continue oral calcium and vitamin D, and weightbearing exercise.  I also encouraged her to consider Zometa infusion every 6 months for 2 years, potential benefit and side effect discussed with her, she declined.   Peripheral neuropathy -Likely secondary to chemotherapy, most in her feet -She has been seeing neurology for right trigeminal neuralgia, she is on Cymbalta and high-dose gabapentin -Continue monitoring.   Hot flushes and weight gain -Her hospitalization has been interrupting her sleep.  We discussed cotreatment, will let her try Effexor 37.5 mg daily, does can be increased if needed. -I will refer her to weight management clinic.  I reviewed healthy diet, low-carb diet, and exercise, patient has not been very compliant with those.   PLAN: -continue Anastrozole -referral to weight management - I prescribe Effexor 37.5 mg for her hot flushes  -phone visit in 2 months  -lab and f/u in 6 months    SUMMARY OF ONCOLOGIC HISTORY: Oncology History Overview Note  Cancer Staging Malignant neoplasm of upper-outer quadrant of right breast in female, estrogen receptor positive (HCC) Staging form: Breast, AJCC 8th Edition - Clinical stage from 11/10/2018: Stage IA (cT1b, cN0, cM0, G2, ER+, PR+, HER2+) - Signed by Malachy Mood, MD on 11/18/2018     Malignant neoplasm of upper-outer quadrant of right breast in female, estrogen receptor positive (HCC)  11/07/2018 Mammogram  Diagnostic 11/07/18 IMPRESSION: 1. 1 x 0.7 x 0.6 cm hypoechoic mass with distortion at the 9-9:30 position of the RIGHT breast 6 cm from the nipple with distortion in the UPPER-OUTER RIGHT breast, corresponding to the mammographic abnormality. One RIGHT axillary  lymph node with borderline cortical thickness. Tissue sampling of the RIGHT breast mass and borderline RIGHT axillary lymph node recommended. ADDENDUM: Patient returned today for biopsy of a single borderline thickened lymph node in the RIGHT axilla. I could not reproduce a morphologically abnormal lymph node today, possibly resolved in the interval. Today, only normal appearing lymph nodes were identified in the RIGHT axilla. As such, the axillary ultrasound-guided biopsy was canceled.   11/10/2018 Cancer Staging   Staging form: Breast, AJCC 8th Edition - Clinical stage from 11/10/2018: Stage IA (cT1b, cN0, cM0, G2, ER+, PR+, HER2+) - Signed by Malachy Mood, MD on 11/18/2018   11/11/2018 Initial Biopsy   Diagnosis 11/11/18 Breast, right, needle core biopsy, upper outer - 9:30 o'clock position - INVASIVE DUCTAL CARCINOMA, GRADE II/III. - SEE MICROSCOPIC DESCRIPTION.   11/11/2018 Receptors her2   Results: IMMUNOHISTOCHEMICAL AND MORPHOMETRIC ANALYSIS PERFORMED MANUALLY The tumor cells are POSITIVE for Her2 (3+). Estrogen Receptor: 95%, POSITIVE, STRONG STAINING INTENSITY Progesterone Receptor: 5%, POSITIVE, STRONG STAINING INTENSITY Proliferation Marker Ki67: 20%   11/17/2018 Initial Diagnosis   Malignant neoplasm of upper-outer quadrant of right breast in female, estrogen receptor positive (HCC)   11/28/2018 Surgery   RIGHT BREAST LUMPECTOMY WITH RADIOACTIVE SEED AND RIGHT AXILLARY SENTINEL LYMPH NODE BIOPSY by Dr. Johna Sheriff  11/28/18    11/28/2018 Pathology Results   Diagnosis 11/28/18  1. Breast, lumpectomy, Right w/seed - INVASIVE DUCTAL CARCINOMA, 1.6 CM, NOTTINGHAM GRADE 2 OF 3. - MARGINS OF RESECTION ARE NOT INVOLVED (CLOSEST MARGIN: LESS THAN 1 MM, ANTERIOR). - DUCTAL CARCINOMA IN SITU. - BIOPSY SITE CHANGES. - SEE ONCOLOGY TABLE. 2. Lymph node, sentinel, biopsy, right Axillary - ONE LYMPH NODE, NEGATIVE FOR CARCINOMA (0/1). 3. Lymph node, sentinel, biopsy, right Axillary - ONE  LYMPH NODE, NEGATIVE FOR CARCINOMA (0/1).   11/28/2018 Cancer Staging   Staging form: Breast, AJCC 8th Edition - Pathologic stage from 11/28/2018: Stage IA (pT1c, pN0, cM0, G2, ER+, PR+, HER2+) - Signed by Malachy Mood, MD on 12/11/2018   12/22/2018 Procedure   Baseline ECHO 12/22/18  IMPRESSIONS  1. The left ventricle has normal systolic function with an ejection fraction of 60-65%. The cavity size was normal. There is mild concentric left ventricular hypertrophy. Left ventricular diastolic parameters were normal.  2. The right ventricle has normal systolic function. The cavity was normal. There is no increase in right ventricular wall thickness.  3. Left atrial size was moderately dilated.  4. The aortic valve is tricuspid. Aortic valve regurgitation was not assessed by color flow Doppler.    01/02/2019 - 02/12/2020 Chemotherapy   Adjuvant chemo weekly Taxol with Herceptin starting 01/02/19. Will d/c after 02/13/19 and will switch to Kadcyla q3weeks starting 02/20/19 due to worsening neuropathy. Due to worsening Muscle cramps Kadcyla stopped 04/03/19 and switched to Herceptin q3weeks on 04/23/19. Completed on 02/12/20   04/09/2019 - 05/28/2019 Radiation Therapy   RT with Dr. Roselind Messier 04/09/19-05/28/19   06/29/2019 -  Anti-estrogen oral therapy   Exemestane 25mg  daily starting 06/29/19   04/28/2020 Survivorship   SCP delivered by Santiago Glad, NP      INTERVAL HISTORY:  Summer [name] is here for a follow up of  right breast cancer . She was last seen by me on 08/17/2022. She  presents to the clinic alone. Pt state that she has gain weight.     All other systems were reviewed with the patient and are negative.  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   Complication of anesthesia    Pt states having some "difficulty waking up"   Constipation 07/01/2018   Costochondritis 05/04/2014   Diverticulitis of colon 06/13/2007   S/p segmental colectomy for perforated diverticulitis    Diverticulosis    Facial pain 10/06/2015   Fibromyalgia    Foot sprain, left, initial encounter 08/31/2016   Glossitis 02/02/2016   Headache 04/24/2016   Helicobacter pylori gastritis 10/2020   HEMATURIA UNSPECIFIED 06/25/2008   Qualifier: Diagnosis of  By: Tawanna Cooler MD, Tinnie Gens A    History of kidney stones    History of shingles 2012--  no residual pain   Hx of adenomatous colonic polyps 10/2020   Itching 10/13/2018   Left eye pain 07/01/2018   Left ureteral calculus    LOC OSTEOARTHROS NOT SPEC PRIM/SEC OTH [facility] SITE 06/27/2009   Qualifier: Diagnosis of  By: Tawanna Cooler MD, Tinnie Gens A    Malignant neoplasm of upper-outer quadrant of right breast in female, 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   MCI (mild cognitive impairment)    Memory loss 12/21/2016   Morbid obesity (HCC) 11/09/2010   Pain of right side of body 08/31/2016   Personal history of chemotherapy    Personal history of radiation therapy    Pruritus 10/26/2016   Renal disorder    hx of   Right sided temporal headache 12/21/2016   Tremor 12/21/2016   Trigeminal neuralgia RIGHT   Trigeminal neuralgia of right side of face 11/30/2010   Trochanteric bursitis, right hip 10/26/2016   Urgency of urination    Weight loss 07/22/2017   Zoster without complications 09/27/2011    SURGICAL HISTORY: Past Surgical History:  Procedure Laterality Date   BREAST BIOPSY Right 11/11/2018   BREAST LUMPECTOMY Right 11/28/2018   BREAST LUMPECTOMY WITH RADIOACTIVE SEED AND SENTINEL LYMPH NODE BIOPSY Right 11/28/2018   Procedure: RIGHT BREAST LUMPECTOMY WITH RADIOACTIVE SEED AND RIGHT  AXILLARY SENTINEL LYMPH NODE BIOPSY;  Surgeon: Glenna Fellows, MD;  Location: [facility];  Service: General;  Laterality: Right;   CEREBRAL MICROVASCULAR DECOMPRESSION  07-05-2006   RIGHT TRIGEMINAL NERVE   COLONOSCOPY  2013   CG-MAC-moviprep (exc)-normal -mild tics   CYSTOSCOPY/RETROGRADE/URETEROSCOPY/STONE EXTRACTION WITH BASKET  07/04/2012   Procedure: CYSTOSCOPY/RETROGRADE/URETEROSCOPY/STONE EXTRACTION WITH BASKET;  Surgeon: Garnett Farm, MD;  Location[facility];  Service: Urology;  Laterality: Left;   EXPLORATORY LAPAROTOMY/ RESECTION MID TO DISTAL SIGMOID AND PROXIMAL RECTUM/ END PROXIMAL SIGMOID COLOSTOMY  07-17-2006   PERFORATED DIVERTICULITIS WITH PERITONITIS   EXTRACORPOREAL SHOCK WAVE LITHOTRIPSY Left 01/19/2021   Procedure: LEFT EXTRACORPOREAL SHOCK WAVE LITHOTRIPSY (ESWL);  Surgeon: Alfredo Martinez, MD;  Location: [facility];  Service: Urology;  Laterality: Left;   gamma knife  05/17/2016   for trigeminal neuralgia, [facility], Dr Angelyn Punt   gamma knife  2019   IR CV  LINE INJECTION  10/27/2019   PORTACATH PLACEMENT Left 11/28/2018   Procedure: INSERTION PORT-A-CATH WITH ULTRASOUND;  Surgeon: Glenna Fellows, MD;  Location: [facility];  Service: General;  Laterality: Left;   portacath removal     RESECTION COLOSTOMY/ CLOSURE COLOSTOMY WITH COLOPROCTOSTOMY  02-20-2007   TONSILLECTOMY  AS CHILD   TOTAL KNEE ARTHROPLASTY Left 06/20/2020   Procedure: LEFT TOTAL KNEE ARTHROPLASTY;  Surgeon: Tarry Kos, MD;  Location: [facility] OR;  Service: Orthopedics;  Laterality: Left;   URETEROSCOPY  07/04/2012   Procedure: URETEROSCOPY;  Surgeon: Garnett Farm, MD;  Location: [facility];  Service: Urology;  Laterality: Left;   VAGINAL HYSTERECTOMY  1998   Partial   WISDOM TOOTH EXTRACTION      I have reviewed the social history and family history with the patient and they are unchanged from previous note.  ALLERGIES:  is allergic to keflex  [cephalexin] and clindamycin hcl.  MEDICATIONS:  Current Outpatient Medications  Medication Sig Dispense Refill   venlafaxine XR (EFFEXOR-XR) 37.5 MG 24 hr capsule Take 37.5 mg by mouth daily with breakfast.     venlafaxine XR (EFFEXOR-XR) 37.5 MG 24 hr capsule Take 1 capsule (37.5 mg total) by mouth daily with breakfast. 30 capsule 1   anastrozole (ARIMIDEX) 1 MG tablet TAKE 1 TABLET BY MOUTH EVERY DAY 90 tablet 3   baclofen (LIORESAL) 10 MG tablet Take 1 tablet (10 mg total) by mouth 2 (two) times daily. 180 tablet 3   diclofenac Sodium (VOLTAREN) 1 % GEL Apply 2 g topically 4 (four) times daily. 150 g 0   DULoxetine (CYMBALTA) 60 MG capsule TAKE 1 CAPSULE BY MOUTH EVERY DAY 90 capsule 3   gabapentin (NEURONTIN) 600 MG tablet TAKE 2 TABLETS BY MOUTH 3 TIMES A DAY. 540 tablet 3   Melatonin 3 MG CAPS Take 1 capsule (3 mg total) by mouth at bedtime.  0   pravastatin (PRAVACHOL) 20 MG tablet TAKE 1 TABLET BY MOUTH EVERY DAY 90 tablet 3   No current facility-administered medications for this visit.    PHYSICAL EXAMINATION: ECOG PERFORMANCE STATUS: 1 - Symptomatic but completely ambulatory  Vitals:   03/29/23 1352  BP: (!) 145/79  Pulse: 66  Temp: 98.8 F (37.1 C)  SpO2: 100%   Wt Readings from Last 3 Encounters:  03/29/23 231 lb 4.8 oz (104.9 kg)  08/30/22 212 lb (96.2 kg)  08/17/22 212 lb 14.4 oz (96.6 kg)     GENERAL:alert, no distress and comfortable SKIN: skin color normal, no rashes or significant lesions EYES: normal, Conjunctiva are pink and non-injected, sclera clear  NEURO: alert & oriented x 3 with fluent speech NECK: (-)supple, thyroid normal size, non-tender, without nodularity LYMPH:  (-)no palpable lymphadenopathy in the cervical, axillary  ABDOMEN:(-)abdomen soft, non-tender and normal bowel sounds BREAST: RT breast  lumpectomy LABORATORY DATA:  I have reviewed the data as listed    Latest Ref Rng & Units 03/29/2023    1:27 PM 08/17/2022    2:56 PM 04/14/2022     4:32 AM  CBC  WBC 4.0 - 10.5 K/uL 6.3  4.9  6.3   Hemoglobin 12.0 - 15.0 g/dL 40.9  81.1  91.4   Hematocrit 36.0 - 46.0 % 40.9  43.4  41.0   Platelets 150 - 400 K/uL 276  240  231         Latest Ref Rng & Units 03/29/2023    1:27 PM 08/17/2022    2:56 PM 04/15/2022  5:10 AM  CMP  Glucose 70 - 99 mg/dL 86  88  90   BUN 8 - 23 mg/dL 10  11  7    Creatinine 0.44 - 1.00 mg/dL 0.10  2.72  5.36   Sodium 135 - 145 mmol/L 140  141  139   Potassium 3.5 - 5.1 mmol/L 3.9  3.9  3.3   Chloride 98 - 111 mmol/L 105  104  106   CO2 22 - 32 mmol/L 29  31  27    Calcium 8.9 - 10.3 mg/dL 9.4  9.9  9.0   Total Protein 6.5 - 8.1 g/dL 7.1  7.2    Total Bilirubin 0.3 - 1.2 mg/dL 0.5  1.0    Alkaline Phos 38 - 126 U/L 94  108    AST 15 - 41 U/L 15  17    ALT 0 - 44 U/L 7  7        RADIOGRAPHIC STUDIES: I have personally reviewed the radiological images as listed and agreed with the findings in the report. No results found.    Orders Placed This Encounter  Procedures   Amb Ref to Medical Weight Management    Referral Priority:   Routine    Referral Type:   Consultation    Number of Visits Requested:   1   All questions were answered. The patient knows to call the clinic with any problems, questions or concerns. No barriers to learning was detected. The total time spent in the appointment was 30 minutes.     Malachy Mood, MD 03/29/2023   Carolin Coy, CMA, am acting as scribe for Malachy Mood, MD.   I have reviewed the above documentation for accuracy and completeness, and I agree with the above.

## 2023-03-31 ENCOUNTER — Encounter: Payer: Self-pay | Admitting: Hematology

## 2023-04-01 ENCOUNTER — Telehealth: Payer: Self-pay | Admitting: Hematology

## 2023-04-09 ENCOUNTER — Telehealth: Payer: Self-pay

## 2023-04-09 NOTE — Telephone Encounter (Signed)
Spoke with [employer] informing her that her FMLA documents had been completed placed up front for pick-up as requested.

## 2023-04-21 ENCOUNTER — Other Ambulatory Visit: Payer: Self-pay | Admitting: Hematology

## 2023-05-14 ENCOUNTER — Encounter: Payer: Self-pay | Admitting: Internal Medicine

## 2023-05-14 ENCOUNTER — Ambulatory Visit: Payer: Federal, State, Local not specified - PPO | Admitting: Internal Medicine

## 2023-05-14 VITALS — BP 124/80 | HR 80 | Temp 98.7°F | Ht 66.0 in | Wt 231.0 lb

## 2023-05-14 DIAGNOSIS — G620 Drug-induced polyneuropathy: Secondary | ICD-10-CM

## 2023-05-14 DIAGNOSIS — R739 Hyperglycemia, unspecified: Secondary | ICD-10-CM

## 2023-05-14 DIAGNOSIS — Z0000 Encounter for general adult medical examination without abnormal findings: Secondary | ICD-10-CM | POA: Diagnosis not present

## 2023-05-14 DIAGNOSIS — T451X5A Adverse effect of antineoplastic and immunosuppressive drugs, initial encounter: Secondary | ICD-10-CM

## 2023-05-14 DIAGNOSIS — E782 Mixed hyperlipidemia: Secondary | ICD-10-CM | POA: Diagnosis not present

## 2023-05-14 LAB — HEMOGLOBIN A1C: Hgb A1c MFr Bld: 5.8 % (ref 4.6–6.5)

## 2023-05-14 MED ORDER — PRAVASTATIN SODIUM 20 MG PO TABS: 20.0000 mg | ORAL_TABLET | Freq: Every day | ORAL | 3 refills | Status: AC

## 2023-05-14 NOTE — Progress Notes (Signed)
   Subjective:   Patient ID: Summer [name], female    DOB: 09-13-1950, 72 y.o.   MRN: [id number]  HPI The patient is here for physical.  PMH, [facility], social history reviewed and updated  Review of Systems  Constitutional: Negative.   HENT: Negative.    Eyes: Negative.   Respiratory:  Negative for cough, chest tightness and shortness of breath.   Cardiovascular:  Negative for chest pain, palpitations and leg swelling.  Gastrointestinal:  Negative for abdominal distention, abdominal pain, constipation, diarrhea, nausea and vomiting.  Musculoskeletal: Negative.   Skin: Negative.   Neurological: Negative.   Psychiatric/Behavioral: Negative.      Objective:  Physical Exam Constitutional:      Appearance: She is well-developed.  HENT:     Head: Normocephalic and atraumatic.  Cardiovascular:     Rate and Rhythm: Normal rate and regular rhythm.  Pulmonary:     Effort: Pulmonary effort is normal. No respiratory distress.     Breath sounds: Normal breath sounds. No wheezing or rales.  Abdominal:     General: Bowel sounds are normal. There is no distension.     Palpations: Abdomen is soft.     Tenderness: There is no abdominal tenderness. There is no rebound.  Musculoskeletal:     Cervical back: Normal range of motion.  Skin:    General: Skin is warm and dry.  Neurological:     Mental Status: She is alert and oriented to person, place, and time.     Coordination: Coordination normal.     Vitals:   05/14/23 1526  BP: 124/80  [name]: 80  Temp: 98.7 F (37.1 C)  TempSrc: Oral  SpO2: 97%  Weight: 231 lb (104.8 kg)  Height: 5\' 6"  (1.676 m)    Assessment & Plan:

## 2023-05-15 LAB — LIPID PANEL
Cholesterol: 221 mg/dL — ABNORMAL HIGH (ref 0–200)
HDL: 70.2 mg/dL (ref >39.00)
LDL Cholesterol: 135 mg/dL — ABNORMAL HIGH (ref 0–99)
NonHDL: 150.33
Total CHOL/HDL Ratio: 3
Triglycerides: 75 mg/dL (ref 0.0–149.0)
VLDL: 15 mg/dL (ref 0.0–40.0)

## 2023-05-17 DIAGNOSIS — G620 Drug-induced polyneuropathy: Secondary | ICD-10-CM | POA: Insufficient documentation

## 2023-05-17 NOTE — Assessment & Plan Note (Signed)
Still with neuropathy and is taking cymbalta and venlafaxine to help. She is taking gabapentin more for her prior trigeminal neuralgia but this may help with this as well. Continue same.

## 2023-05-17 NOTE — Assessment & Plan Note (Signed)
Checking lipid panel and refilled pravastatin 20 mg daily. Adjust as needed.

## 2023-05-17 NOTE — Assessment & Plan Note (Signed)
Flu shot declines. Pneumonia declines. Shingrix declines. Tetanus declines. Colonoscopy up to date. Mammogram up to date, pap smear aged out and dexa up to date. Counseled about sun safety and mole surveillance. Counseled about the dangers of distracted driving. Given 10 year screening recommendations.

## 2023-05-17 NOTE — Assessment & Plan Note (Signed)
Checking HgA1c and adjust as needed.  

## 2023-05-30 ENCOUNTER — Inpatient Hospital Stay: Payer: Federal, State, Local not specified - PPO | Attending: Hematology | Admitting: Hematology

## 2023-05-30 DIAGNOSIS — M8580 Other specified disorders of bone density and structure, unspecified site: Secondary | ICD-10-CM | POA: Diagnosis not present

## 2023-05-30 DIAGNOSIS — Z170 Estrogen receptor positive status [ER+]: Secondary | ICD-10-CM

## 2023-05-30 DIAGNOSIS — C50411 Malignant neoplasm of upper-outer quadrant of right female breast: Secondary | ICD-10-CM | POA: Diagnosis not present

## 2023-05-30 MED ORDER — VENLAFAXINE HCL ER 37.5 MG PO CP24
37.5000 mg | ORAL_CAPSULE | Freq: Every day | ORAL | 1 refills | Status: AC
Start: 2023-05-30 — End: ?

## 2023-05-30 NOTE — Assessment & Plan Note (Signed)
Her baseline DEXA from 07/2019 shows osteopenia with lowest T-score -1.1 at left hip.  -Will continue to monitor on AI as this can weaken her bones. Repeat every 2 years.  -I recommend she start OTC calcium and Vitamin D.  -DEXA 01/31/2022 showed lowest T score -1.8 at the forearm radius; BMD had also decreased at the left femur neck from -1.1 to -1.7, overall low FRAX score. -I encouraged her to continue oral calcium and vitamin D, and weightbearing exercise.  I also encouraged her to consider Zometa infusion every 6 months for 2 years, potential benefit and side effect discussed with her, she declined.

## 2023-05-30 NOTE — Progress Notes (Signed)
[facility]   Telephone:([phone]) [phone] Fax:(336) [phone]   Clinic Follow up Note   Patient Care Team: Myrlene Broker, MD as PCP - General (Internal Medicine) Quintella Reichert, MD as PCP - Cardiology (Cardiology) Pershing Proud, RN as Oncology Nurse Navigator Donnelly Angelica, RN as Oncology Nurse Navigator Glenna Fellows, MD (Inactive) as Consulting Physician (General Surgery) Malachy Mood, MD as Consulting Physician (Hematology) Antony Blackbird, MD as Consulting Physician (Radiation Oncology) Pollyann Samples, NP as Nurse Practitioner (Nurse Practitioner) 05/30/2023  I connected with Summer [name] on 05/30/23 at  1:00 PM EDT by telephone and verified that I am speaking with the correct person using two identifiers.   I discussed the limitations, risks, security and privacy concerns of performing an evaluation and management service by telephone and the availability of in person appointments. I also discussed with the patient that there may be a patient responsible charge related to this service. The patient expressed understanding and agreed to proceed.   Patient's location:  Home  Provider's location:  Office    CHIEF COMPLAINT: f/u breast cancer   CURRENT THERAPY: Adjuvant anastrozole  ASSESSMENT & PLAN:  Malignant neoplasm of upper-outer quadrant of right breast in female, estrogen receptor positive (HCC) Stage IA, pT1b,N0,M0, ER/PR+, HER2+, Grade II   --She was diagnosed in 11/2018. She is s/p right breast lumpectomy and SLNB  and adjuvant radiation. She was treated with adjuvant chemo (poorly tolerated Taxol and Herceptin, d/c after 6 weeks), Kadcyla for 1 month, then Herceptin only until 12/2019.  -I started her on antiestrogen therapy with Exemestane in 06/2019. She has tolerated moderately due to known joint pain worsening in shoulders and knees.  Exemestane was changed to anastrozole in June 2023, she is tolerating well.  Will continue.  -She is  clinically doing well, lab reviewed, exam was unremarkable, no clinical concern for recurrence. -She is not happy with the asymmetry of her breasts, would like to do breast reductions.  She has seen 2 plastic surgeons, not happy with the recommendations.   Osteopenia Her baseline DEXA from 07/2019 shows osteopenia with lowest T-score -1.1 at left hip.  -Will continue to monitor on AI as this can weaken her bones. Repeat every 2 years.  -I recommend she start OTC calcium and Vitamin D.  -DEXA 01/31/2022 showed lowest T score -1.8 at the forearm radius; BMD had also decreased at the left femur neck from -1.1 to -1.7, overall low FRAX score. -I encouraged her to continue oral calcium and vitamin D, and weightbearing exercise.  I also encouraged her to consider Zometa infusion every 6 months for 2 years, potential benefit and side effect discussed with her, she declined.   Menopausal Symptoms Improvement in hot flashes with Effexor. -Continue Effexor, refill prescription for 3 months with one refill.  Peripheral Neuropathy Worsening burning sensation in toes and feet, likely secondary to previous chemotherapy. Currently on Gabapentin 600mg  six times daily. -Continue Gabapentin. -Discuss symptoms with neurologist at upcoming appointment on June 26, 2023.   Weight Management [facility], but patient has not been contacted. -Provided patient with clinic's phone number to schedule an appointment.  Breast Cancer Surveillance Last mammogram in June 2024 in [place]. -Next mammogram due in June 2025.  Follow-up Schedule office visit and labs in six months.       Plan -Continue anastrozole and Effexor -Lab and follow-up in 6 months  SUMMARY OF ONCOLOGIC HISTORY: Oncology History Overview Note  Cancer Staging  Malignant neoplasm of upper-outer quadrant of right breast in female, estrogen receptor positive (HCC) Staging form: Breast, AJCC 8th Edition -  Clinical stage from 11/10/2018: Stage IA (cT1b, cN0, cM0, G2, ER+, PR+, HER2+) - Signed by Malachy Mood, MD on 11/18/2018     Malignant neoplasm of upper-outer quadrant of right breast in female, estrogen receptor positive (HCC)  11/07/2018 Mammogram   Diagnostic 11/07/18 IMPRESSION: 1. 1 x 0.7 x 0.6 cm hypoechoic mass with distortion at the 9-9:30 position of the RIGHT breast 6 cm from the nipple with distortion in the UPPER-OUTER RIGHT breast, corresponding to the mammographic abnormality. One RIGHT axillary lymph node with borderline cortical thickness. Tissue sampling of the RIGHT breast mass and borderline RIGHT axillary lymph node recommended. ADDENDUM: Patient returned today for biopsy of a single borderline thickened lymph node in the RIGHT axilla. I could not reproduce a morphologically abnormal lymph node today, possibly resolved in the interval. Today, only normal appearing lymph nodes were identified in the RIGHT axilla. As such, the axillary ultrasound-guided biopsy was canceled.   11/10/2018 Cancer Staging   Staging form: Breast, AJCC 8th Edition - Clinical stage from 11/10/2018: Stage IA (cT1b, cN0, cM0, G2, ER+, PR+, HER2+) - Signed by Malachy Mood, MD on 11/18/2018   11/11/2018 Initial Biopsy   Diagnosis 11/11/18 Breast, right, needle core biopsy, upper outer - 9:30 o'clock position - INVASIVE DUCTAL CARCINOMA, GRADE II/III. - SEE MICROSCOPIC DESCRIPTION.   11/11/2018 Receptors her2   Results: IMMUNOHISTOCHEMICAL AND MORPHOMETRIC ANALYSIS PERFORMED MANUALLY The tumor cells are POSITIVE for Her2 (3+). Estrogen Receptor: 95%, POSITIVE, STRONG STAINING INTENSITY Progesterone Receptor: 5%, POSITIVE, STRONG STAINING INTENSITY Proliferation Marker Ki67: 20%   11/17/2018 Initial Diagnosis   Malignant neoplasm of upper-outer quadrant of right breast in female, estrogen receptor positive (HCC)   11/28/2018 Surgery   RIGHT BREAST LUMPECTOMY WITH RADIOACTIVE SEED AND RIGHT AXILLARY SENTINEL  LYMPH NODE BIOPSY by Dr. Johna Sheriff  11/28/18    11/28/2018 Pathology Results   Diagnosis 11/28/18  1. Breast, lumpectomy, Right w/seed - INVASIVE DUCTAL CARCINOMA, 1.6 CM, NOTTINGHAM GRADE 2 OF 3. - MARGINS OF RESECTION ARE NOT INVOLVED (CLOSEST MARGIN: LESS THAN 1 MM, ANTERIOR). - DUCTAL CARCINOMA IN SITU. - BIOPSY SITE CHANGES. - SEE ONCOLOGY TABLE. 2. Lymph node, sentinel, biopsy, right Axillary - ONE LYMPH NODE, NEGATIVE FOR CARCINOMA (0/1). 3. Lymph node, sentinel, biopsy, right Axillary - ONE LYMPH NODE, NEGATIVE FOR CARCINOMA (0/1).   11/28/2018 Cancer Staging   Staging form: Breast, AJCC 8th Edition - Pathologic stage from 11/28/2018: Stage IA (pT1c, pN0, cM0, G2, ER+, PR+, HER2+) - Signed by Malachy Mood, MD on 12/11/2018   12/22/2018 Procedure   Baseline ECHO 12/22/18  IMPRESSIONS  1. The left ventricle has normal systolic function with an ejection fraction of 60-65%. The cavity size was normal. There is mild concentric left ventricular hypertrophy. Left ventricular diastolic parameters were normal.  2. The right ventricle has normal systolic function. The cavity was normal. There is no increase in right ventricular wall thickness.  3. Left atrial size was moderately dilated.  4. The aortic valve is tricuspid. Aortic valve regurgitation was not assessed by color flow Doppler.    01/02/2019 - 02/12/2020 Chemotherapy   Adjuvant chemo weekly Taxol with Herceptin starting 01/02/19. Will d/c after 02/13/19 and will switch to Kadcyla q3weeks starting 02/20/19 due to worsening neuropathy. Due to worsening Muscle cramps Kadcyla stopped 04/03/19 and switched to Herceptin q3weeks on 04/23/19. Completed on 02/12/20   04/09/2019 - 05/28/2019 Radiation Therapy  RT with Dr. Roselind Messier 04/09/19-05/28/19   06/29/2019 -  Anti-estrogen oral therapy   Exemestane 25mg  daily starting 06/29/19   04/28/2020 Survivorship   SCP delivered by Santiago Glad, NP     INTERVAL HISTORY: Discussed the use of AI scribe software  for clinical note transcription with the patient, who gave verbal consent to proceed.  History of Present Illness   The patient, with a history of breast cancer and peripheral neuropathy, reports improvement in hot flashes since starting Effexor two months ago. However, they note worsening burning sensation in the toes and feet, which is suspected to be due to previous chemotherapy. They are currently on gabapentin and baclofen for this issue, but still have bad days. They are scheduled to see a neurologist for further management.  The patient is also on anastrozole as part of their antiestrogen therapy for breast cancer, diagnosed in March 2020. They report no issues with this medication. They also have osteopenia and are taking calcium and vitamin D supplements. However, they struggle with exercise due to pain in the knees and feet, particularly a cramping sensation that starts in the knee and extends to the heel and third toe of the left foot.  The patient was referred to a weight management clinic but has not yet heard from them. They plan to call the clinic to schedule an appointment.        REVIEW OF SYSTEMS:   Constitutional: Denies fevers, chills or abnormal weight loss Eyes: Denies blurriness of vision Ears, nose, mouth, throat, and face: Denies mucositis or sore throat Respiratory: Denies cough, dyspnea or wheezes Cardiovascular: Denies palpitation, chest discomfort or lower extremity swelling Gastrointestinal:  Denies nausea, heartburn or change in bowel habits Skin: Denies abnormal skin rashes Lymphatics: Denies new lymphadenopathy or easy bruising Neurological:Denies numbness, tingling or new weaknesses Behavioral/Psych: Mood is stable, no new changes  All other systems were reviewed with the patient and are negative.  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   Complication of anesthesia    Pt states having some "difficulty waking up"   Constipation 07/01/2018   Costochondritis 05/04/2014   Diverticulitis of colon 06/13/2007   S/p segmental colectomy for perforated diverticulitis    Diverticulosis    Facial pain 10/06/2015   Fibromyalgia    Foot sprain, left, initial encounter 08/31/2016   Glossitis 02/02/2016   Headache 04/24/2016   Helicobacter pylori gastritis 10/2020   HEMATURIA UNSPECIFIED 06/25/2008   Qualifier: Diagnosis of  By: Tawanna Cooler MD, Tinnie Gens A    History of kidney stones    History of shingles 2012--  no residual pain   Hx of adenomatous colonic polyps 10/2020   Itching 10/13/2018   Left eye pain 07/01/2018   Left ureteral calculus    LOC OSTEOARTHROS NOT SPEC PRIM/SEC OTH [facility] SITE 06/27/2009   Qualifier: Diagnosis of  By: Tawanna Cooler MD, Tinnie Gens A    Malignant neoplasm of upper-outer quadrant of right breast in female, 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   MCI (mild cognitive impairment)    Memory  loss 12/21/2016   Morbid obesity (HCC) 11/09/2010   Pain of right side of body 08/31/2016   Personal history of chemotherapy    Personal history of radiation therapy    Pruritus 10/26/2016   Renal disorder    hx of   Right sided temporal headache 12/21/2016   Tremor 12/21/2016   Trigeminal neuralgia RIGHT   Trigeminal neuralgia of right side of face 11/30/2010   Trochanteric bursitis, right hip 10/26/2016   Urgency of urination    Weight loss 07/22/2017   Zoster without complications 09/27/2011    SURGICAL HISTORY: Past Surgical History:  Procedure Laterality Date   BREAST BIOPSY Right 11/11/2018   BREAST LUMPECTOMY Right 11/28/2018   BREAST LUMPECTOMY WITH  RADIOACTIVE SEED AND SENTINEL LYMPH NODE BIOPSY Right 11/28/2018   Procedure: RIGHT BREAST LUMPECTOMY WITH RADIOACTIVE SEED AND RIGHT AXILLARY SENTINEL LYMPH NODE BIOPSY;  Surgeon: Glenna Fellows, MD;  Location: [facility] OR;  Service: General;  Laterality: Right;   CEREBRAL MICROVASCULAR DECOMPRESSION  07-05-2006   RIGHT TRIGEMINAL NERVE   COLONOSCOPY  2013   CG-MAC-moviprep (exc)-normal -mild tics   CYSTOSCOPY/RETROGRADE/URETEROSCOPY/STONE EXTRACTION WITH BASKET  07/04/2012   Procedure: CYSTOSCOPY/RETROGRADE/URETEROSCOPY/STONE EXTRACTION WITH BASKET;  Surgeon: Garnett Farm, MD;  Location: [facility];  Service: Urology;  Laterality: Left;   EXPLORATORY LAPAROTOMY/ RESECTION MID TO DISTAL SIGMOID AND PROXIMAL RECTUM/ END PROXIMAL SIGMOID COLOSTOMY  07-17-2006   PERFORATED DIVERTICULITIS WITH PERITONITIS   EXTRACORPOREAL SHOCK WAVE LITHOTRIPSY Left 01/19/2021   Procedure: LEFT EXTRACORPOREAL SHOCK WAVE LITHOTRIPSY (ESWL);  Surgeon: Alfredo Martinez, MD;  Location: [facility];  Service: Urology;  Laterality: Left;   gamma knife  05/17/2016   for trigeminal neuralgia, [facility], Dr Angelyn Punt   gamma knife  2019   IR CV LINE INJECTION  10/27/2019   PORTACATH PLACEMENT Left 11/28/2018   Procedure: INSERTION PORT-A-CATH WITH ULTRASOUND;  Surgeon: Glenna Fellows, MD;  Location: [facility] OR;  Service: General;  Laterality: Left;   portacath removal     RESECTION COLOSTOMY/ CLOSURE COLOSTOMY WITH COLOPROCTOSTOMY  02-20-2007   TONSILLECTOMY  AS CHILD   TOTAL KNEE ARTHROPLASTY Left 06/20/2020   Procedure: LEFT TOTAL KNEE ARTHROPLASTY;  Surgeon: Tarry Kos, MD;  Location: [facility] OR;  Service: Orthopedics;  Laterality: Left;   URETEROSCOPY  07/04/2012   Procedure: URETEROSCOPY;  Surgeon: Garnett Farm, MD;  Location: [facility];  Service: Urology;  Laterality: Left;   VAGINAL HYSTERECTOMY  1998   Partial   WISDOM TOOTH EXTRACTION      I have reviewed the  social history and family history with the patient and they are unchanged from previous note.  ALLERGIES:  is allergic to keflex [cephalexin] and clindamycin hcl.  MEDICATIONS:  Current Outpatient Medications  Medication Sig Dispense Refill   anastrozole (ARIMIDEX) 1 MG tablet TAKE 1 TABLET BY MOUTH EVERY DAY 90 tablet 3   baclofen (LIORESAL) 10 MG tablet Take 1 tablet (10 mg total) by mouth 2 (two) times daily. 180 tablet 3   diclofenac Sodium (VOLTAREN) 1 % GEL Apply 2 g topically 4 (four) times daily. 150 g 0   DULoxetine (CYMBALTA) 60 MG capsule TAKE 1 CAPSULE BY MOUTH EVERY DAY 90 capsule 3   gabapentin (NEURONTIN) 600 MG tablet TAKE 2 TABLETS BY MOUTH 3 TIMES A DAY. 540 tablet 3   Melatonin 3 MG CAPS Take 1 capsule (3 mg total) by mouth at bedtime.  0   pravastatin (PRAVACHOL) 20 MG tablet Take 1 tablet (20 mg  total) by mouth daily. 90 tablet 3   venlafaxine XR (EFFEXOR-XR) 37.5 MG 24 hr capsule TAKE 1 CAPSULE BY MOUTH DAILY WITH BREAKFAST. 90 capsule 1   venlafaxine XR (EFFEXOR-XR) 37.5 MG 24 hr capsule Take 1 capsule (37.5 mg total) by mouth daily with breakfast. 90 capsule 1   No current facility-administered medications for this visit.    PHYSICAL EXAMINATION: Not performed   LABORATORY DATA:  I have reviewed the data as listed    Latest Ref Rng & Units 03/29/2023    1:27 PM 08/17/2022    2:56 PM 04/14/2022    4:32 AM  CBC  WBC 4.0 - 10.5 K/uL 6.3  4.9  6.3   Hemoglobin 12.0 - 15.0 g/dL 21.3  08.6  57.8   Hematocrit 36.0 - 46.0 % 40.9  43.4  41.0   Platelets 150 - 400 K/uL 276  240  231         Latest Ref Rng & Units 03/29/2023    1:27 PM 08/17/2022    2:56 PM 04/15/2022    5:10 AM  CMP  Glucose 70 - 99 mg/dL 86  88  90   BUN 8 - 23 mg/dL 10  11  7    Creatinine 0.44 - 1.00 mg/dL 4.69  6.29  5.28   Sodium 135 - 145 mmol/L 140  141  139   Potassium 3.5 - 5.1 mmol/L 3.9  3.9  3.3   Chloride 98 - 111 mmol/L 105  104  106   CO2 22 - 32 mmol/L 29  31  27    Calcium 8.9 -  10.3 mg/dL 9.4  9.9  9.0   Total Protein 6.5 - 8.1 g/dL 7.1  7.2    Total Bilirubin 0.3 - 1.2 mg/dL 0.5  1.0    Alkaline Phos 38 - 126 U/L 94  108    AST 15 - 41 U/L 15  17    ALT 0 - 44 U/L 7  7        RADIOGRAPHIC STUDIES: I have personally reviewed the radiological images as listed and agreed with the findings in the report. No results found.     I discussed the assessment and treatment plan with the patient. The patient was provided an opportunity to ask questions and all were answered. The patient agreed with the plan and demonstrated an understanding of the instructions.   The patient was advised to call back or seek an in-person evaluation if the symptoms worsen or if the condition fails to improve as anticipated.  I provided 22 minutes of non face-to-face telephone visit time during this encounter, and > 50% was spent counseling as documented under my assessment & plan.     Malachy Mood, MD 05/30/23

## 2023-05-30 NOTE — Assessment & Plan Note (Signed)
Stage IA, pT1b,N0,M0, ER/PR+, HER2+, Grade II   --She was diagnosed in 11/2018. She is s/p right breast lumpectomy and SLNB  and adjuvant radiation. She was treated with adjuvant chemo (poorly tolerated Taxol and Herceptin, d/c after 6 weeks), Kadcyla for 1 month, then Herceptin only until 12/2019.  -I started her on antiestrogen therapy with Exemestane in 06/2019. She has tolerated moderately due to known joint pain worsening in shoulders and knees.  Exemestane was changed to anastrozole in June 2023, she is tolerating well.  Will continue.  -She is clinically doing well, lab reviewed, exam was unremarkable, no clinical concern for recurrence. -She is not happy with the asymmetry of her breasts, would like to do breast reductions.  She has seen 2 plastic surgeons, not happy with the recommendations.

## 2023-06-22 ENCOUNTER — Other Ambulatory Visit: Payer: Self-pay | Admitting: Neurology

## 2023-06-25 NOTE — Progress Notes (Unsigned)
PATIENT: Summer [name] DOB: 1951/03/15  REASON FOR VISIT: Follow up right sided trigeminal neuralgia  HISTORY FROM: Patient PRIMARY NEUROLOGIST: Dr. Terrace Arabia   ASSESSMENT AND PLAN 72 y.o. year old female   1.  Right-sided trigeminal neuralgia -More pain lately, has been off baclofen for about a month, likely correlates with increase in pain, will restart regimen below -Continue current regimen:  -Baclofen 10 mg twice a day -Gabapentin 600 mg, 2 tablets 3 times daily -Cymbalta 60 mg daily  -In the past has tried Lamictal, carbamazepine, Trileptal, Lyrica -Follow-up in 1 year or sooner if needed  HISTORY  Summer [name] is a 72 years old right-handed African American female, referred by her primary care physician Dr. Kelle Darting for evaluation of right facial pain   She was diagnosed with right trigeminal neuralgia in 2006, presenting with severe electronics shooting pain at her right upper molar, she had microvascular decompression surgery at [facility] by Dr. Leanord Asal around 2006, which has helped the severe pain, but she continued to have annoying almost constant pain at her right face, she describes hot cold sensitivity over right face, touching of right forehead region will also set of her right facial pain, she felt right tongue swelling, she bites on her right tongue sometimes, she works at customer service, on the phone many hours each day, with prolonged talking, she felt sandpaper was rubbing against her right tongue, also complains of right upper and lower lip numbness, intermittent electronics shooting pain,   Over the years, she has tried different medication, was evaluated by our office Dr. Vickey Huger in 2008, she has tried gabapentin, Lyrica, Cymbalta, Trileptal, currently is taking Tegretol 200 mg twice a day, initially it helps her some, but failed to help work consistently, She denied visual change, recent few months of left lower back, left hip pain, radiating to  left posterior thigh, to her left leg and left foot, difficulty walking after prolonged sitting  She complains of left low back pain, left hip pain, radiating pain to left foot, like walking on a dead, burning sensation, she continued to complains of right facial constant achy pain, there was no significant change after started on Oxtellar xR 300 mg every day, no significant side effects,   I have reviewed MRI, mild small vessel disease, no significant abnormality otherwise,   She had MRI lumbar spine (without) demonstrating: L5-S1: facet hypertrophy with moderate right and severe left foraminal stenosis  L4-5: pseudo disc bulging and facet hypertrophy with moderate right and mild left foraminal stenosis. There is 2 mm anterior spondylolisthesis of L4 on L5.  Degenerative spondylosis and disc bulging L1-2 and L4-5.   UPDATE Sep 30 2014:YY She felt sand paper sensation in her right tongue, flashing sensation, last 1-2 weeks, only take gabapentin 300mg  tid now. She also complains of snoring, frequent wakening during the nighttime, excessive sleepiness at daytime, F ESS score is 37, ESS is 9   UPDATE 01/26/2017[name], 72 year old female returns for follow-up. She has a history of right trigeminal neuralgia is currently on Neurontin 300 mg 3 times a day has breakthrough pain. 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 She occasionally will have upper and lower lip numbness. She returns for reevaluation   UPDATE February 09 2016:YY She complained increased right facial pain since April 2017, involving right V1/V2 branch, she is taking gabapentin 300 mg 4  times a day, only helped her mildly, triggered by wind blow on her right face, talking, chewing, she works as a Programmer, multimedia, it is very difficult for her to perform her job,   Update March 01 2016:YY She presented to the emergency room February 21 2016 for  nausea, unexpected fall, night before that because severe facial pain, she took extra dose of lamotrigine, made it to 250 mg that particular day, laboratory evaluation showed mildly low potassium of CMP WAS GIVEN SUPPLEMENT, NORMAL CBC, lamotrigine level in June 15 was 15.6,  For a while, she had severe right facial pain, was taking gabapentin 300 mg 2 tablets every 2 hours, 3 tablets at night, about 11 tablets each day 3300 mg every day plus lamotrigine 100 mg twice a day,    Lamotrigine 100 mg twice a day has been very helpful, it has taking care of her long bothered right tongue sandpaper sensation. She is now taking gabapentin 300 mg 3/2/3 tablets each day, she has been off lamotrigine since February 26 2016, the sandpaper sensation on the right side of the tongue came back, she is now taking Cymbalta 30 mg 3 times a day   She has been complains 3 weeks history of urinary urgency, difficulty initiating urine, UA showed mild UTI, was treated with Cipro without improving her symptoms   UPDATE May 10 2016:YY She was evaluated by Dr. Angelyn Punt in July 2017, is planning on to have gamma knife in early September, she complains of mild memory loss, difficulty handling her computer tasks sometimes, there was no family history of memory loss, she also complains of episode, right after she get out of the bed,  She is on polypharmacy, now taking lamotrigine 100 mg twice a day, gabapentin 300 mg 4 in the morning, 2 at lunch time, 4 tablets at nighttime. She also has obesity, snoring, excessive daytime fatigue and sleepiness   We have personally reviewed CT head without contrast in June 2017, no acute abnormality, MRI of the cervical in 2008, multilevel degenerative changes, most severe at C5-6, C6-7, with herniated disc, mild canal stenosis, mild cord signal changes.   Laboratory evaluation in 2017, elevated WBC 11 point 8, hemoglobin of 13, no significant abnormality on BMP   UPDATE Apr 02 2017:YY We have  personally reviewed MRI of the brain in April 2018 generalized atrophy mild supratentorium small vessel disease MRI of cervical spine April, multilevel degenerative changes most severe at C6-7, mild canal stenosissignificant foraminal narrowing or signal changes,   She had a, knife by [facility] Dr. Brooke Pace in September 2017 she denies significant improvement, now cold air still trigger her right facial pain, bleeding from right temporal region along the right face, right jaw, electronic shooting pain, to her right lower molar region, she has intermittent left hand tremor, driving,   She is now taking Lamictal 100 mg 3 tablets each day, gabapentin 600 mg 5 tablets each day, despite combination therapy, she remains symptomatic, she is able to go to work.  She is on the phone 10 hours a day, mild memory loss.  I reviewed laboratory evaluation in 2018, negative troponin, CBC with hemoglobin of 11.9, CMP, creatinine of 0.84, ESR, mild elevated C reactive protein 7.8, lamotrigine 12.9, TSH 1.2 7,    UPDATE Sept 1 2020: She was diagnosed with right breast cancer, had right lobectomy followed by chemo and radiation therapy, since April 2020, she noticed increased bilateral feet paresthesia, and also intermittent bilateral finger paresthesia, despite taking gabapentin  600 mg up to 3 times a day, her right trigeminal pain is under good control with current dose, she also has frequent lower extremity muscle spasm, has tried muscle relaxant methocarbamol with limited help   Virtual Visit via Video Aug 10, 2020 She has left knee replacement on Jun 20 2020, recovering well, still in the process of PT, out of work until Jan 2021.   She complains of left low back pain, radiating pain to left hip for 6 months, even before her left knee replacement, now made it worse by prolonged sitting,   I personally reviewed x-ray of lumbar in April 2021, significant multilevel degenerative disease, scoliosis   Her right  facial trigeminal pain is under reasonable control with current dose of gabapentin 600 mg 2 tablets 3 times a day,   UPDATE March 09 2021: She complains of mild right tongue discomfort, feel swollen, despite taking gabapentin 600mg  2 tab tid,   Update October 17, 2021 SS: Tried the Cymbalta 60 mg daily, noted not much change Taking gabapentin 600 mg, 2 tablets 3 times daily, pain is under stable control. Has had gamma knife twice, MVD in past. 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   Today 06/19/22 SS: Right TN pain is under good control. We added baclofen 10 mg BID last time with good benefit. Still on gabapentin 600 mg TID, Cymbalta 60 mg daily. Denies any side effects. Had cataract surgery to left eye. Had SBO in August. Planning to have breast augmentation, abdominoplasty. Still working at call center. Still has some TN pain, but is not significant. Baclofen has really helped. Has intermittent feeling of numbness, to right mouth. Rarely has sharp, electrical pain to right face. Might consider dose reduction when she retires. She does a lot of talking with her job. 04/15/22 creatinine 0.64.  Update June 26, 2023 SS: Has been taking gabapentin 600 mg TID, Cymbalta 60 mg daily, has not been taking baclofen 10 mg BID for several weeks.  Has noted increased right sided facial pain, electric stabs.  At times feels like her tongue swells, her tongue is like sandpaper.  Has had some vague feeling of dizziness.  REVIEW OF SYSTEMS: Out of a complete 14 system review of symptoms, the patient complains only of the following symptoms, and all other reviewed systems are negative.  See HPI  ALLERGIES: Allergies  Allergen Reactions   Keflex [Cephalexin] Anaphylaxis and Other (See Comments)    Caused violent dizziness and syncope   Clindamycin Hcl Itching    HOME MEDICATIONS: Outpatient Medications Prior to Visit  Medication Sig Dispense Refill    anastrozole (ARIMIDEX) 1 MG tablet TAKE 1 TABLET BY MOUTH EVERY DAY 90 tablet 3   baclofen (LIORESAL) 10 MG tablet Take 1 tablet (10 mg total) by mouth 2 (two) times daily. 180 tablet 3   diclofenac Sodium (VOLTAREN) 1 % GEL Apply 2 g topically 4 (four) times daily. 150 g 0   DULoxetine (CYMBALTA) 60 MG capsule TAKE 1 CAPSULE BY MOUTH EVERY DAY 90 capsule 3   gabapentin (NEURONTIN) 600 MG tablet TAKE 2 TABLETS BY MOUTH 3 TIMES A DAY 540 tablet 3   Melatonin 3 MG CAPS Take 1 capsule (3 mg total) by mouth at bedtime.  0   pravastatin (PRAVACHOL) 20 MG tablet Take 1 tablet (20 mg total) by mouth daily. 90 tablet 3   venlafaxine XR (EFFEXOR-XR) 37.5 MG 24 hr capsule Take  1 capsule (37.5 mg total) by mouth daily with breakfast. 90 capsule 1   venlafaxine XR (EFFEXOR-XR) 37.5 MG 24 hr capsule TAKE 1 CAPSULE BY MOUTH DAILY WITH BREAKFAST. 90 capsule 1   No facility-administered medications prior to visit.    PAST MEDICAL HISTORY: 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   Complication of anesthesia    Pt states having some "difficulty waking up"   Constipation 07/01/2018   Costochondritis 05/04/2014   Diverticulitis of colon 06/13/2007   S/p segmental colectomy for perforated diverticulitis    Diverticulosis    Facial pain 10/06/2015   Fibromyalgia    Foot sprain, left, initial encounter 08/31/2016   Glossitis 02/02/2016   Headache 04/24/2016   Helicobacter pylori gastritis 10/2020   HEMATURIA UNSPECIFIED 06/25/2008   Qualifier: Diagnosis of  By: Tawanna Cooler MD, Tinnie Gens A    History of kidney stones    History of shingles 2012--  no  residual pain   Hx of adenomatous colonic polyps 10/2020   Itching 10/13/2018   Left eye pain 07/01/2018   Left ureteral calculus    LOC OSTEOARTHROS NOT SPEC PRIM/SEC OTH [facility] SITE 06/27/2009   Qualifier: Diagnosis of  By: Tawanna Cooler MD, Tinnie Gens A    Malignant neoplasm of upper-outer quadrant of right breast in female, 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   MCI (mild cognitive impairment)    Memory loss 12/21/2016   Morbid obesity (HCC) 11/09/2010   Pain of right side of body 08/31/2016   Personal history of chemotherapy    Personal history of radiation therapy    Pruritus 10/26/2016   Renal disorder    hx of   Right sided temporal headache 12/21/2016   Tremor 12/21/2016   Trigeminal neuralgia RIGHT   Trigeminal neuralgia of right side of face 11/30/2010   Trochanteric bursitis, right hip 10/26/2016   Urgency of urination    Weight loss 07/22/2017   Zoster without complications 09/27/2011    PAST SURGICAL HISTORY: Past Surgical History:  Procedure Laterality Date   BREAST BIOPSY Right 11/11/2018   BREAST LUMPECTOMY Right 11/28/2018   BREAST LUMPECTOMY WITH RADIOACTIVE SEED AND SENTINEL LYMPH NODE BIOPSY Right 11/28/2018   Procedure: RIGHT BREAST LUMPECTOMY WITH RADIOACTIVE SEED AND RIGHT AXILLARY SENTINEL LYMPH NODE BIOPSY;  Surgeon: Glenna Fellows, MD;  Location: [facility] OR;  Service: General;  Laterality: Right;   CEREBRAL MICROVASCULAR DECOMPRESSION  07-05-2006   RIGHT TRIGEMINAL NERVE   COLONOSCOPY  2013   CG-MAC-moviprep (exc)-normal -mild tics   CYSTOSCOPY/RETROGRADE/URETEROSCOPY/STONE EXTRACTION WITH BASKET  07/04/2012   Procedure: CYSTOSCOPY/RETROGRADE/URETEROSCOPY/STONE EXTRACTION WITH BASKET;  Surgeon: Garnett Farm, MD;  Location: [facility] [place];  Service: Urology;  Laterality: Left;   EXPLORATORY LAPAROTOMY/ RESECTION MID TO DISTAL SIGMOID AND PROXIMAL RECTUM/ END PROXIMAL SIGMOID COLOSTOMY  07-17-2006   PERFORATED DIVERTICULITIS WITH PERITONITIS   EXTRACORPOREAL  SHOCK WAVE LITHOTRIPSY Left 01/19/2021   Procedure: LEFT EXTRACORPOREAL SHOCK WAVE LITHOTRIPSY (ESWL);  Surgeon: Alfredo Martinez, MD;  Location: [facility];  Service: Urology;  Laterality: Left;   gamma knife  05/17/2016  for trigeminal neuralgia, [facility], Dr Angelyn Punt   gamma knife  2019   IR CV LINE INJECTION  10/27/2019   PORTACATH PLACEMENT Left 11/28/2018   Procedure: INSERTION PORT-A-CATH WITH ULTRASOUND;  Surgeon: Glenna Fellows, MD;  Location: [facility] OR;  Service: General;  Laterality: Left;   portacath removal     RESECTION COLOSTOMY/ CLOSURE COLOSTOMY WITH COLOPROCTOSTOMY  02-20-2007   TONSILLECTOMY  AS CHILD   TOTAL KNEE ARTHROPLASTY Left 06/20/2020   Procedure: LEFT TOTAL KNEE ARTHROPLASTY;  Surgeon: Tarry Kos, MD;  Location: [facility] OR;  Service: Orthopedics;  Laterality: Left;   URETEROSCOPY  07/04/2012   Procedure: URETEROSCOPY;  Surgeon: Garnett Farm, MD;  Location: [facility];  Service: Urology;  Laterality: Left;   VAGINAL HYSTERECTOMY  1998   Partial   WISDOM TOOTH EXTRACTION      FAMILY HISTORY: Family History  Problem Relation Age of Onset   Heart disease Maternal Grandmother    Diabetes Mother    Breast cancer Maternal Aunt    Lupus Cousin    Colon cancer Neg Hx    Esophageal cancer Neg Hx    Rectal cancer Neg Hx    Stomach cancer Neg Hx    Colon polyps Neg Hx     SOCIAL HISTORY: Social History   Socioeconomic History   Marital status: Married    Spouse name: Not on file   Number of children: 1   Years of education: College   Highest education level: Not on file  Occupational History    Employer: UNEMPLOYED   Occupation: HUMAN RESOURCES     Employer: [employer]  Tobacco Use   Smoking status: Never   Smokeless tobacco: Never  Vaping Use   Vaping status: Never Used  Substance and Sexual Activity   Alcohol use: Yes    Comment: rarely   Drug use: No   Sexual activity: Not on file  Other Topics Concern    Not on file  Social History Narrative   Not on file   Social Determinants of Health   Financial Resource Strain: Not on file  Food Insecurity: Not on file  Transportation Needs: Not on file  Physical Activity: Not on file  Stress: Not on file  Social Connections: Unknown (02/13/2023)   [facility] from [facility]   Social Network    Social Network: Not on file  Intimate Partner Violence: Unknown (02/13/2023)   Received from [facility]   HITS    Physically Hurt: Not on file    Insult or Talk Down To: Not on file    Threaten Physical Harm: Not on file    Scream or Curse: Not on file   PHYSICAL EXAM  Vitals:   06/26/23 1522  BP: (!) 146/82  [name]: 85  Weight: 235 lb 6.4 oz (106.8 kg)  Height: 5\' 6"  (1.676 m)    Body mass index is 37.99 kg/m.  Generalized: Well developed, in no acute distress   Neurological examination  Mentation: Alert oriented to time, place, history taking. Follows all commands speech and language fluent Cranial nerve II-XII: Pupils were equal round reactive to light. Extraocular movements were full, visual field were full on confrontational test. Facial sensation and strength were normal. Head turning and shoulder shrug  were normal and symmetric. Motor: The motor testing reveals 5 over 5 strength of all 4 extremities. Good symmetric motor tone is noted throughout.  Sensory: Sensory testing is intact to soft touch on  all 4 extremities. No evidence of extinction is noted.  Coordination: Cerebellar testing reveals good finger-nose-finger and heel-to-shin bilaterally.  Gait and station: Gait is normal.  Reflexes: Deep tendon reflexes are symmetric   DIAGNOSTIC DATA (LABS, IMAGING, TESTING) - I reviewed patient records, labs, notes, testing and imaging myself where available.  Lab Results  Component Value Date   WBC 6.3 03/29/2023   HGB 13.4 03/29/2023   HCT 40.9 03/29/2023   MCV 90.1 03/29/2023   PLT 276 03/29/2023       Component Value Date/Time   NA 140 03/29/2023 1327   K 3.9 03/29/2023 1327   CL 105 03/29/2023 1327   CO2 29 03/29/2023 1327   GLUCOSE 86 03/29/2023 1327   GLUCOSE 96 08/20/2006 1433   BUN 10 03/29/2023 1327   CREATININE 0.73 03/29/2023 1327   CREATININE 0.83 11/18/2020 1628   CALCIUM 9.4 03/29/2023 1327   PROT 7.1 03/29/2023 1327   ALBUMIN 4.0 03/29/2023 1327   AST 15 03/29/2023 1327   ALT 7 03/29/2023 1327   ALKPHOS 94 03/29/2023 1327   BILITOT 0.5 03/29/2023 1327   GFRNONAA >60 03/29/2023 1327   GFRAA >60 06/13/2020 0828   GFRAA >60 04/28/2020 0937   Lab Results  Component Value Date   CHOL 221 (H) 05/14/2023   HDL 70.20 05/14/2023   LDLCALC 135 (H) 05/14/2023   LDLDIRECT 141.1 10/01/2013   TRIG 75.0 05/14/2023   CHOLHDL 3 05/14/2023   Lab Results  Component Value Date   HGBA1C 5.8 05/14/2023   Lab Results  Component Value Date   VITAMINB12 452 11/18/2020   Lab Results  Component Value Date   TSH 1.17 11/18/2020    Margie Ege, AGNP-C, DNP 06/26/2023, 3:38 PM [facility] [address] [place], [place] [address] [phone]

## 2023-06-26 ENCOUNTER — Encounter: Payer: Self-pay | Admitting: Neurology

## 2023-06-26 ENCOUNTER — Encounter: Payer: Self-pay | Admitting: Hematology

## 2023-06-26 ENCOUNTER — Ambulatory Visit: Payer: Federal, State, Local not specified - PPO | Admitting: Neurology

## 2023-06-26 VITALS — BP 146/82 | HR 85 | Ht 66.0 in | Wt 235.4 lb

## 2023-06-26 DIAGNOSIS — G500 Trigeminal neuralgia: Secondary | ICD-10-CM

## 2023-06-26 MED ORDER — DULOXETINE HCL 60 MG PO CPEP: ORAL_CAPSULE | ORAL | 3 refills | Status: DC

## 2023-06-26 MED ORDER — BACLOFEN 10 MG PO TABS
10.0000 mg | ORAL_TABLET | Freq: Two times a day (BID) | ORAL | 3 refills | Status: AC
Start: 2023-06-26 — End: ?

## 2023-06-26 NOTE — Patient Instructions (Signed)
Restart baclofen 10 mg twice daily.  Will continue long-term gabapentin and Cymbalta for control of trigeminal neuralgia.  If pain continues please let me know.

## 2023-07-31 ENCOUNTER — Encounter: Payer: Self-pay | Admitting: Hematology

## 2023-07-31 NOTE — Telephone Encounter (Signed)
Telephone call  

## 2023-08-01 ENCOUNTER — Encounter: Payer: Self-pay | Admitting: Hematology

## 2023-08-01 NOTE — Telephone Encounter (Signed)
Telephone call  

## 2023-08-22 ENCOUNTER — Encounter (INDEPENDENT_AMBULATORY_CARE_PROVIDER_SITE_OTHER): Payer: Self-pay | Admitting: Adult Health

## 2023-08-22 ENCOUNTER — Ambulatory Visit (INDEPENDENT_AMBULATORY_CARE_PROVIDER_SITE_OTHER): Payer: Federal, State, Local not specified - PPO | Admitting: Adult Health

## 2023-08-22 VITALS — BP 148/78 | HR 83 | Temp 98.7°F | Ht 64.5 in | Wt 236.0 lb

## 2023-08-22 DIAGNOSIS — Z6839 Body mass index (BMI) 39.0-39.9, adult: Secondary | ICD-10-CM | POA: Diagnosis not present

## 2023-08-22 DIAGNOSIS — Z0000 Encounter for general adult medical examination without abnormal findings: Secondary | ICD-10-CM

## 2023-08-22 DIAGNOSIS — E782 Mixed hyperlipidemia: Secondary | ICD-10-CM

## 2023-08-22 NOTE — Progress Notes (Signed)
Office: [phone]  /  Fax: [phone]   Initial Visit  Summer [name] was seen in clinic today to evaluate for obesity. She is interested in losing weight to improve overall health and reduce the risk of weight related complications. She presents today to review program treatment options, initial physical assessment, and evaluation.     She was referred by: Self-Referral  When asked what else they would like to accomplish? She states: Adopt healthier eating patterns, Improve energy levels and physical activity, Reduce number of medications, and Improve quality of life She is currently size 20, she would like to fit comfortably into size 16  Weight history: She reports gaining weight since her marriage at age 72  When asked how has your weight affected you? She states: Contributed to medical problems, Contributed to orthopedic problems or mobility issues, Having fatigue, and Having poor endurance  Some associated conditions: Arthritis:Diffuse Pain and Hyperlipidemia  Contributing factors: Family history of obesity, Use of obesogenic medications: Psychotropic medications and Other: Bacolfen and Gabapentin, Moderate to high levels of stress, Reduced physical activity, and Eating patterns  Weight promoting medications identified: Psychotropic medications and Other: Baclofen and Gabapentin  Current nutrition plan: None  Current level of physical activity: None  Current or previous pharmacotherapy: GLP-1  Response to medication: Was cost prohibitive or lost coverage for [employer] and [employer], therefore unable to obtain regularly.  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   Complication of anesthesia    Pt states having some "difficulty waking up"   Constipation 07/01/2018   Costochondritis 05/04/2014   Diverticulitis of colon 06/13/2007   S/p segmental colectomy for perforated diverticulitis    Diverticulosis    Facial pain 10/06/2015   Fibromyalgia    Foot sprain, left, initial encounter 08/31/2016   Glossitis 02/02/2016   Headache 04/24/2016   Helicobacter pylori gastritis 10/2020   HEMATURIA UNSPECIFIED 06/25/2008   Qualifier: Diagnosis of  By: Tawanna Cooler MD, Tinnie Gens A    History of kidney stones    History of shingles 2012--  no residual pain   Hx of adenomatous colonic polyps 10/2020   Itching 10/13/2018   Left eye pain 07/01/2018   Left ureteral calculus    LOC OSTEOARTHROS NOT SPEC PRIM/SEC OTH [facility] SITE 06/27/2009   Qualifier: Diagnosis of  By: Tawanna Cooler MD, Tinnie Gens A    Malignant neoplasm of upper-outer quadrant of right breast in female, 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   MCI (mild cognitive impairment)    Memory loss 12/21/2016   Morbid obesity (HCC) 11/09/2010   Pain of right side of body 08/31/2016   Personal history of chemotherapy    Personal history of radiation therapy    Pruritus 10/26/2016   Renal disorder    hx of   Right sided temporal headache 12/21/2016   Tremor 12/21/2016   Trigeminal neuralgia RIGHT   Trigeminal neuralgia of right side of face 11/30/2010  Trochanteric bursitis, right hip 10/26/2016   Urgency of urination    Weight loss 07/22/2017   Zoster without complications 09/27/2011     Objective:   BP (!) 148/78 (BP Location: Left Arm, Patient Position: Sitting, Cuff Size: Large)   Pulse 83   Temp 98.7 F (37.1 C)   Ht 5' 4.5" (1.638 m)   Wt 236 lb (107 kg)   SpO2 98%   BMI 39.88 kg/m  She was weighed on the  bioimpedance scale: Body mass index is 39.88 kg/m.  Peak Weight:269 , Body Fat%:50.6, Visceral Fat Rating:17, Weight trend over the last 12 months:Increasing   General:  Alert, oriented and cooperative. Patient is in no acute distress.  Respiratory: Normal respiratory effort, no problems with respiration noted   Gait: able to ambulate independently  Mental Status: Normal mood and affect. Normal behavior. Normal judgment and thought content.   DIAGNOSTIC DATA REVIEWED:  BMET    Component Value Date/Time   NA 140 03/29/2023 1327   K 3.9 03/29/2023 1327   CL 105 03/29/2023 1327   CO2 29 03/29/2023 1327   GLUCOSE 86 03/29/2023 1327   GLUCOSE 96 08/20/2006 1433   BUN 10 03/29/2023 1327   CREATININE 0.73 03/29/2023 1327   CREATININE 0.83 11/18/2020 1628   CALCIUM 9.4 03/29/2023 1327   GFRNONAA >60 03/29/2023 1327   GFRAA >60 06/13/2020 0828   GFRAA >60 04/28/2020 0937   Lab Results  Component Value Date   HGBA1C 5.8 05/14/2023   HGBA1C  11/14/2009    5.9 (NOTE) The ADA recommends the following therapeutic goal for glycemic control related to Hgb A1c measurement: Goal of therapy: <6.5 Hgb A1c  Reference: American Diabetes Association: Clinical Practice Recommendations 2010, Diabetes Care, 2010, 33: (Suppl  1).   No results found for: "INSULIN" CBC    Component Value Date/Time   WBC 6.3 03/29/2023 1327   WBC 6.3 04/14/2022 0432   RBC 4.54 03/29/2023 1327   HGB 13.4 03/29/2023 1327   HCT 40.9 03/29/2023 1327   HCT 30.4 (L) 11/19/2018 1543   PLT 276 03/29/2023 1327   MCV 90.1 03/29/2023 1327   MCH 29.5 03/29/2023 1327   MCHC 32.8 03/29/2023 1327   RDW 13.1 03/29/2023 1327   Iron/TIBC/Ferritin/ %Sat    Component Value Date/Time   IRON 108 03/29/2023 1327   TIBC 319 03/29/2023 1327   FERRITIN 61 03/29/2023 1327   IRONPCTSAT 34 (H) 03/29/2023 1327   Lipid Panel     Component Value Date/Time   CHOL 221 (H) 05/14/2023 1603   TRIG 75.0 05/14/2023 1603   HDL 70.20  05/14/2023 1603   CHOLHDL 3 05/14/2023 1603   VLDL 15.0 05/14/2023 1603   LDLCALC 135 (H) 05/14/2023 1603   LDLDIRECT 141.1 10/01/2013 0820   Hepatic Function Panel     Component Value Date/Time   PROT 7.1 03/29/2023 1327   ALBUMIN 4.0 03/29/2023 1327   AST 15 03/29/2023 1327   ALT 7 03/29/2023 1327   ALKPHOS 94 03/29/2023 1327   BILITOT 0.5 03/29/2023 1327   BILIDIR 0.0 09/15/2020 0926      Component Value Date/Time   TSH 1.17 11/18/2020 1628     Assessment and Plan:   Healthcare maintenance  Mixed hyperlipidemia  Morbid obesity (HCC), Starting BMI 39.9        Obesity Treatment / Action Plan:  Patient will work on garnering support from family and friends to begin weight loss journey. Will work on eliminating or reducing the presence  of highly palatable, calorie dense foods in the home. Will complete provided nutritional and psychosocial assessment questionnaire before the next appointment. Will be scheduled for indirect calorimetry to determine resting energy expenditure in a fasting state.  This will allow [place] to create a reduced calorie, high-protein meal plan to promote loss of fat mass while preserving muscle mass. Counseled on the health benefits of losing 5%-15% of total body weight. Was counseled on nutritional approaches to weight loss and benefits of reducing processed foods and consuming plant-based foods and high quality protein as part of nutritional weight management. Was counseled on pharmacotherapy and role as an adjunct in weight management.   Obesity Education Performed Today:  She was weighed on the bioimpedance scale and results were discussed and documented in the synopsis.  We discussed obesity as a disease and the importance of a more detailed evaluation of all the factors contributing to the disease.  We discussed the importance of long term lifestyle changes which include nutrition, exercise and behavioral modifications as well as the  importance of customizing this to her specific health and social needs.  We discussed the benefits of reaching a healthier weight to alleviate the symptoms of existing conditions and reduce the risks of the biomechanical, metabolic and psychological effects of obesity.  Summer [name] appears to be in the action stage of change and states they are ready to start intensive lifestyle modifications and behavioral modifications.  30 minutes was spent today on this visit including the above counseling, pre-visit chart review, and post-visit documentation.  Reviewed by clinician on day of visit: allergies, medications, problem list, medical history, surgical history, family history, social history, and previous encounter notes pertinent to obesity diagnosis.   Worthy Rancher, MD

## 2023-08-27 ENCOUNTER — Encounter (INDEPENDENT_AMBULATORY_CARE_PROVIDER_SITE_OTHER): Payer: Self-pay | Admitting: Adult Health

## 2023-09-30 ENCOUNTER — Encounter: Payer: Self-pay | Admitting: Internal Medicine

## 2023-10-03 ENCOUNTER — Other Ambulatory Visit: Payer: Self-pay | Admitting: Nurse Practitioner

## 2023-10-03 DIAGNOSIS — M8580 Other specified disorders of bone density and structure, unspecified site: Secondary | ICD-10-CM

## 2023-10-03 DIAGNOSIS — C50411 Malignant neoplasm of upper-outer quadrant of right female breast: Secondary | ICD-10-CM

## 2023-10-03 DIAGNOSIS — D509 Iron deficiency anemia, unspecified: Secondary | ICD-10-CM

## 2023-10-03 NOTE — Assessment & Plan Note (Addendum)
Stage IA, pT1b,N0,M0, ER/PR+, HER2+, Grade II   --She was diagnosed in 11/2018. She is s/p right breast lumpectomy and SLNB  and adjuvant radiation. She was treated with adjuvant chemo (poorly tolerated Taxol and Herceptin, d/c after 6 weeks), Kadcyla for 1 month, then Herceptin only until 12/2019.  -Antiestrogen therapy was started with Exemestane in 06/2019. She has tolerated moderately well. Due to known joint pain worsening in shoulders and knees, exemestane was changed to anastrozole in June 2023, she is tolerating well.  Will continue.  -She is clinically doing well, lab reviewed, exam was unremarkable, no clinical concern for recurrence. -She is not happy with the asymmetry of her breasts, would like to do breast reductions.  She has seen 2 plastic surgeons, not happy with the recommendations.  -Most recent mammogram done 02/21/2023 through [facility].  Benign results.  Recall expected in 1 year.  She will also be due for bone density test at that time.

## 2023-10-03 NOTE — Progress Notes (Unsigned)
Patient Care Team: Myrlene Broker, MD as PCP - General (Internal Medicine) Quintella Reichert, MD as PCP - Cardiology (Cardiology) Pershing Proud, RN as Oncology Nurse Navigator Donnelly Angelica, RN as Oncology Nurse Navigator Glenna Fellows, MD (Inactive) as Consulting Physician (General Surgery) Malachy Mood, MD as Consulting Physician (Hematology) Antony Blackbird, MD as Consulting Physician (Radiation Oncology) Pollyann Samples, NP as Nurse Practitioner (Nurse Practitioner)  Clinic Day: 10/03/2023  Referring physician: Myrlene Broker, *  ASSESSMENT & PLAN:   Assessment & Plan: Malignant neoplasm of upper-outer quadrant of right breast in female, estrogen receptor positive (HCC) Stage IA, pT1b,N0,M0, ER/PR+, HER2+, Grade II   --She was diagnosed in 11/2018. She is s/p right breast lumpectomy and SLNB  and adjuvant radiation. She was treated with adjuvant chemo (poorly tolerated Taxol and Herceptin, d/c after 6 weeks), Kadcyla for 1 month, then Herceptin only until 12/2019.  -Antiestrogen therapy was started with Exemestane in 06/2019. She has tolerated moderately well. Due to known joint pain worsening in shoulders and knees, exemestane was changed to anastrozole in June 2023, she is tolerating well.  Will continue.  -She is clinically doing well, lab reviewed, exam was unremarkable, no clinical concern for recurrence. -She is not happy with the asymmetry of her breasts, would like to do breast reductions.  She has seen 2 plastic surgeons, not happy with the recommendations.  -Most recent mammogram done 02/21/2023 through [facility].  Benign results.  Recall expected in 1 year.  She will also be due for bone density test at that time.   Plan:  Labs reviewed  -CBC showing WBC 5.6; Hgb 13.8; Hct 43.2; Plt 256; Anc 2.9 -CMP - K 3.7; glucose 92; BUN 8; Creatinine 0.71; eGFR >60; Ca 9.6; LFTs normal.   -Ferritin 77; vitamin D 62.74; iron 81; TIBC 332; saturation ratio 24%; UIBC  251. -Reviewed most recent mammogram done 02/21/2023 through [facility].  This showed benign results.  Recommendation for 1 year recall. Continue anastrozole 1 mg daily. New, bilateral diagnostic mammogram and DEXA scan due in May 2025.  Orders placed for [facility] facility. Labs with follow-up in 6 months. The patient does not plan to pursue breast reduction and augmentation at this point, she understands she can contact the office and we can consider referral to new reconstructive surgeon.  The patient understands the plans discussed today and is in agreement with them.  She knows to contact our office if she develops concerns prior to her next appointment.  I provided 25 minutes of face-to-face time during this encounter and > 50% was spent counseling as documented under my assessment and plan.    Carlean Jews, NP  [facility] - A DEPT OF [facility] [address] [place] [address] Dept: [phone] Dept Fax: [phone]   Orders Placed This Encounter  Procedures   MM 3D DIAGNOSTIC MAMMOGRAM BILATERAL BREAST    [facility] facility. Please schedule with diagnostic mammogram    Standing Status:   Future    Expected Date:   02/01/2024    Expiration Date:   10/03/2024    Reason for Exam (SYMPTOM  OR DIAGNOSIS REQUIRED):   breast cancer follow up    Preferred imaging location?:   External   DG Bone Density    Please schedule with mammogram    Standing Status:   Future    Expected Date:   02/01/2024  Expiration Date:   10/03/2024    Reason for Exam (SYMPTOM  OR DIAGNOSIS REQUIRED):   estrogen deficiency, screen for osteoporosis    Preferred imaging location?:   External    Call Results- Best Contact Number?:   [facility]      CHIEF COMPLAINT:  CC: Right breast cancer, estrogen receptor positive  Current Treatment: Adjuvant anastrozole  INTERVAL HISTORY:  Summer [name] is here today  for repeat clinical assessment.  She last had telephone encounter with Dr. Mosetta Putt on 05/30/2023.  Was encouraged to continue oral calcium and vitamin D.  Recommended she consider Zometa for 2 years.  Hot flashes being managed with Effexor.  Most recent mammogram done 02/21/2023 with benign results. She denies chest pain, chest pressure, or shortness of breath. She denies headaches or visual disturbances. She denies abdominal pain, nausea, vomiting, or changes in bowel or bladder habits.  She denies fevers or chills. She denies pain. Her appetite is good. Her weight has increased 3 pounds over last 3 months .  I have reviewed the past medical history, past surgical history, social history and family history with the patient and they are unchanged from previous note.  ALLERGIES:  is allergic to keflex [cephalexin] and clindamycin hcl.  MEDICATIONS:  Current Outpatient Medications  Medication Sig Dispense Refill   anastrozole (ARIMIDEX) 1 MG tablet TAKE 1 TABLET BY MOUTH EVERY DAY 90 tablet 3   baclofen (LIORESAL) 10 MG tablet Take 1 tablet (10 mg total) by mouth 2 (two) times daily. 180 tablet 3   diclofenac Sodium (VOLTAREN) 1 % GEL Apply 2 g topically 4 (four) times daily. 150 g 0   DULoxetine (CYMBALTA) 60 MG capsule TAKE 1 CAPSULE BY MOUTH EVERY DAY 90 capsule 3   gabapentin (NEURONTIN) 600 MG tablet TAKE 2 TABLETS BY MOUTH 3 TIMES A DAY 540 tablet 3   Melatonin 3 MG CAPS Take 1 capsule (3 mg total) by mouth at bedtime.  0   pravastatin (PRAVACHOL) 20 MG tablet Take 1 tablet (20 mg total) by mouth daily. 90 tablet 3   venlafaxine XR (EFFEXOR-XR) 37.5 MG 24 hr capsule Take 1 capsule (37.5 mg total) by mouth daily with breakfast. 90 capsule 1   No current facility-administered medications for this visit.    HISTORY OF PRESENT ILLNESS:   Oncology History Overview Note  Cancer Staging Malignant neoplasm of upper-outer quadrant of right breast in female, estrogen receptor positive (HCC) Staging  form: Breast, AJCC 8th Edition - Clinical stage from 11/10/2018: Stage IA (cT1b, cN0, cM0, G2, ER+, PR+, HER2+) - Signed by Malachy Mood, MD on 11/18/2018     Malignant neoplasm of upper-outer quadrant of right breast in female, estrogen receptor positive (HCC)  11/07/2018 Mammogram   Diagnostic 11/07/18 IMPRESSION: 1. 1 x 0.7 x 0.6 cm hypoechoic mass with distortion at the 9-9:30 position of the RIGHT breast 6 cm from the nipple with distortion in the UPPER-OUTER RIGHT breast, corresponding to the mammographic abnormality. One RIGHT axillary lymph node with borderline cortical thickness. Tissue sampling of the RIGHT breast mass and borderline RIGHT axillary lymph node recommended. ADDENDUM: Patient returned today for biopsy of a single borderline thickened lymph node in the RIGHT axilla. I could not reproduce a morphologically abnormal lymph node today, possibly resolved in the interval. Today, only normal appearing lymph nodes were identified in the RIGHT axilla. As such, the axillary ultrasound-guided biopsy was canceled.   11/10/2018 Cancer Staging   Staging form: Breast, AJCC 8th Edition - Clinical stage  from 11/10/2018: Stage IA (cT1b, cN0, cM0, G2, ER+, PR+, HER2+) - Signed by Malachy Mood, MD on 11/18/2018   11/11/2018 Initial Biopsy   Diagnosis 11/11/18 Breast, right, needle core biopsy, upper outer - 9:30 o'clock position - INVASIVE DUCTAL CARCINOMA, GRADE II/III. - SEE MICROSCOPIC DESCRIPTION.   11/11/2018 Receptors her2   Results: IMMUNOHISTOCHEMICAL AND MORPHOMETRIC ANALYSIS PERFORMED MANUALLY The tumor cells are POSITIVE for Her2 (3+). Estrogen Receptor: 95%, POSITIVE, STRONG STAINING INTENSITY Progesterone Receptor: 5%, POSITIVE, STRONG STAINING INTENSITY Proliferation Marker Ki67: 20%   11/17/2018 Initial Diagnosis   Malignant neoplasm of upper-outer quadrant of right breast in female, estrogen receptor positive (HCC)   11/28/2018 Surgery   RIGHT BREAST LUMPECTOMY WITH RADIOACTIVE  SEED AND RIGHT AXILLARY SENTINEL LYMPH NODE BIOPSY by Dr. Johna Sheriff  11/28/18    11/28/2018 Pathology Results   Diagnosis 11/28/18  1. Breast, lumpectomy, Right w/seed - INVASIVE DUCTAL CARCINOMA, 1.6 CM, NOTTINGHAM GRADE 2 OF 3. - MARGINS OF RESECTION ARE NOT INVOLVED (CLOSEST MARGIN: LESS THAN 1 MM, ANTERIOR). - DUCTAL CARCINOMA IN SITU. - BIOPSY SITE CHANGES. - SEE ONCOLOGY TABLE. 2. Lymph node, sentinel, biopsy, right Axillary - ONE LYMPH NODE, NEGATIVE FOR CARCINOMA (0/1). 3. Lymph node, sentinel, biopsy, right Axillary - ONE LYMPH NODE, NEGATIVE FOR CARCINOMA (0/1).   11/28/2018 Cancer Staging   Staging form: Breast, AJCC 8th Edition - Pathologic stage from 11/28/2018: Stage IA (pT1c, pN0, cM0, G2, ER+, PR+, HER2+) - Signed by Malachy Mood, MD on 12/11/2018   12/22/2018 Procedure   Baseline ECHO 12/22/18  IMPRESSIONS  1. The left ventricle has normal systolic function with an ejection fraction of 60-65%. The cavity size was normal. There is mild concentric left ventricular hypertrophy. Left ventricular diastolic parameters were normal.  2. The right ventricle has normal systolic function. The cavity was normal. There is no increase in right ventricular wall thickness.  3. Left atrial size was moderately dilated.  4. The aortic valve is tricuspid. Aortic valve regurgitation was not assessed by color flow Doppler.    01/02/2019 - 02/12/2020 Chemotherapy   Adjuvant chemo weekly Taxol with Herceptin starting 01/02/19. Will d/c after 02/13/19 and will switch to Kadcyla q3weeks starting 02/20/19 due to worsening neuropathy. Due to worsening Muscle cramps Kadcyla stopped 04/03/19 and switched to Herceptin q3weeks on 04/23/19. Completed on 02/12/20   04/09/2019 - 05/28/2019 Radiation Therapy   RT with Dr. Roselind Messier 04/09/19-05/28/19   06/29/2019 -  Anti-estrogen oral therapy   Exemestane 25mg  daily starting 06/29/19   04/28/2020 Survivorship   SCP delivered by Santiago Glad, NP       REVIEW OF SYSTEMS:    Constitutional: Denies fevers, chills or abnormal weight loss Eyes: Denies blurriness of vision Ears, nose, mouth, throat, and face: Denies mucositis or sore throat Respiratory: Denies cough, dyspnea or wheezes Cardiovascular: Denies palpitation, chest discomfort or lower extremity swelling Gastrointestinal:  Denies nausea, heartburn or change in bowel habits Skin: Denies abnormal skin rashes Lymphatics: Denies new lymphadenopathy or easy bruising Neurological:Denies numbness, tingling or new weaknesses Behavioral/Psych: Mood is stable, no new changes  All other systems were reviewed with the patient and are negative.   VITALS:   Today's Vitals   10/04/23 1324 10/04/23 1332  BP: 136/60   Pulse: 75   Resp: 18   Temp: 97.7 F (36.5 C)   TempSrc: Temporal   SpO2: 98%   Weight: 238 lb (108 kg)   Height: 5' 4.5" (1.638 m)   PainSc:  0-No pain   Body mass index is  40.22 kg/m.   Wt Readings from Last 3 Encounters:  10/04/23 238 lb (108 kg)  08/22/23 236 lb (107 kg)  06/26/23 235 lb 6.4 oz (106.8 kg)    Body mass index is 40.22 kg/m.  Performance status (ECOG): 1 - Symptomatic but completely ambulatory  PHYSICAL EXAM:   GENERAL:alert, no distress and comfortable SKIN: skin color, texture, turgor are normal, no rashes or significant lesions EYES: normal, Conjunctiva are pink and non-injected, sclera clear OROPHARYNX:no exudate, no erythema and lips, buccal mucosa, and tongue normal  NECK: supple, thyroid normal size, non-tender, without nodularity LYMPH:  no palpable lymphadenopathy in the cervical, axillary or inguinal LUNGS: clear to auscultation and percussion with normal breathing effort HEART: regular rate & rhythm and no murmurs and no lower extremity edema ABDOMEN:abdomen soft, non-tender and normal bowel sounds Musculoskeletal:no cyanosis of digits and no clubbing  NEURO: alert & oriented x 3 with fluent speech, no focal motor/sensory deficits BREAST: right  breast without palpable mass or lump.  There is no nipple inversion or discharge.  There is no axillary lymphadenopathy on the right.  Left breast noticeably larger than right.  No lumps or masses.  Slight tenderness along the lower aspect of the breast.  Well-healed lumpectomy scar.  No nipple inversion or discharge.  There is no axillary lymphadenopathy on the left.  LABORATORY DATA:  I have reviewed the data as listed    Component Value Date/Time   NA 140 10/04/2023 1305   K 3.7 10/04/2023 1305   CL 103 10/04/2023 1305   CO2 31 10/04/2023 1305   GLUCOSE 92 10/04/2023 1305   GLUCOSE 96 08/20/2006 1433   BUN 8 10/04/2023 1305   CREATININE 0.71 10/04/2023 1305   CREATININE 0.83 11/18/2020 1628   CALCIUM 9.6 10/04/2023 1305   PROT 7.1 10/04/2023 1305   ALBUMIN 4.0 10/04/2023 1305   AST 16 10/04/2023 1305   ALT 8 10/04/2023 1305   ALKPHOS 96 10/04/2023 1305   BILITOT 0.6 10/04/2023 1305   GFRNONAA >60 10/04/2023 1305   GFRAA >60 06/13/2020 0828   GFRAA >60 04/28/2020 0937    Lab Results  Component Value Date   SPEP Comment 11/19/2018    Lab Results  Component Value Date   WBC 5.6 10/04/2023   NEUTROABS 2.9 10/04/2023   HGB 13.8 10/04/2023   HCT 43.2 10/04/2023   MCV 90.9 10/04/2023   PLT 256 10/04/2023

## 2023-10-04 ENCOUNTER — Inpatient Hospital Stay: Payer: Federal, State, Local not specified - PPO | Admitting: Nurse Practitioner

## 2023-10-04 ENCOUNTER — Inpatient Hospital Stay: Payer: Federal, State, Local not specified - PPO | Attending: Hematology

## 2023-10-04 VITALS — BP 136/60 | HR 75 | Temp 97.7°F | Resp 18 | Ht 64.5 in | Wt 238.0 lb

## 2023-10-04 DIAGNOSIS — M8588 Other specified disorders of bone density and structure, other site: Secondary | ICD-10-CM | POA: Diagnosis not present

## 2023-10-04 DIAGNOSIS — C50411 Malignant neoplasm of upper-outer quadrant of right female breast: Secondary | ICD-10-CM | POA: Insufficient documentation

## 2023-10-04 DIAGNOSIS — Z79811 Long term (current) use of aromatase inhibitors: Secondary | ICD-10-CM | POA: Diagnosis not present

## 2023-10-04 DIAGNOSIS — D509 Iron deficiency anemia, unspecified: Secondary | ICD-10-CM

## 2023-10-04 DIAGNOSIS — M8580 Other specified disorders of bone density and structure, unspecified site: Secondary | ICD-10-CM

## 2023-10-04 DIAGNOSIS — Z170 Estrogen receptor positive status [ER+]: Secondary | ICD-10-CM | POA: Diagnosis not present

## 2023-10-04 DIAGNOSIS — Z9221 Personal history of antineoplastic chemotherapy: Secondary | ICD-10-CM | POA: Diagnosis not present

## 2023-10-04 DIAGNOSIS — Z1721 Progesterone receptor positive status: Secondary | ICD-10-CM | POA: Diagnosis not present

## 2023-10-04 DIAGNOSIS — Z923 Personal history of irradiation: Secondary | ICD-10-CM | POA: Insufficient documentation

## 2023-10-04 DIAGNOSIS — Z1731 Human epidermal growth factor receptor 2 positive status: Secondary | ICD-10-CM | POA: Insufficient documentation

## 2023-10-04 LAB — CBC WITH DIFFERENTIAL (CANCER CENTER ONLY)
Abs Immature Granulocytes: 0.02 10*3/uL (ref 0.00–0.07)
Basophils Absolute: 0.1 10*3/uL (ref 0.0–0.1)
Basophils Relative: 1 %
Eosinophils Absolute: 0.2 10*3/uL (ref 0.0–0.5)
Eosinophils Relative: 3 %
HCT: 43.2 % (ref 36.0–46.0)
Hemoglobin: 13.8 g/dL (ref 12.0–15.0)
Immature Granulocytes: 0 %
Lymphocytes Relative: 37 %
Lymphs Abs: 2.1 10*3/uL (ref 0.7–4.0)
MCH: 29.1 pg (ref 26.0–34.0)
MCHC: 31.9 g/dL (ref 30.0–36.0)
MCV: 90.9 fL (ref 80.0–100.0)
Monocytes Absolute: 0.4 10*3/uL (ref 0.1–1.0)
Monocytes Relative: 7 %
Neutro Abs: 2.9 10*3/uL (ref 1.7–7.7)
Neutrophils Relative %: 52 %
Platelet Count: 256 10*3/uL (ref 150–400)
RBC: 4.75 MIL/uL (ref 3.87–5.11)
RDW: 13.2 % (ref 11.5–15.5)
WBC Count: 5.6 10*3/uL (ref 4.0–10.5)
nRBC: 0 % (ref 0.0–0.2)

## 2023-10-04 LAB — CMP (CANCER CENTER ONLY)
ALT: 8 U/L (ref 0–44)
AST: 16 U/L (ref 15–41)
Albumin: 4 g/dL (ref 3.5–5.0)
Alkaline Phosphatase: 96 U/L (ref 38–126)
Anion gap: 6 (ref 5–15)
BUN: 8 mg/dL (ref 8–23)
CO2: 31 mmol/L (ref 22–32)
Calcium: 9.6 mg/dL (ref 8.9–10.3)
Chloride: 103 mmol/L (ref 98–111)
Creatinine: 0.71 mg/dL (ref 0.44–1.00)
GFR, Estimated: >60 mL/min (ref >60)
Glucose, Bld: 92 mg/dL (ref 70–99)
Potassium: 3.7 mmol/L (ref 3.5–5.1)
Sodium: 140 mmol/L (ref 135–145)
Total Bilirubin: 0.6 mg/dL (ref 0.0–1.2)
Total Protein: 7.1 g/dL (ref 6.5–8.1)

## 2023-10-04 LAB — IRON AND IRON BINDING CAPACITY (CC-WL,HP ONLY)
Iron: 81 ug/dL (ref 28–170)
Saturation Ratios: 24 % (ref 10.4–31.8)
TIBC: 332 ug/dL (ref 250–450)
UIBC: 251 ug/dL (ref 148–442)

## 2023-10-04 LAB — VITAMIN D 25 HYDROXY (VIT D DEFICIENCY, FRACTURES): Vit D, 25-Hydroxy: 62.74 ng/mL (ref 30–100)

## 2023-10-04 LAB — FERRITIN: Ferritin: 77 ng/mL (ref 11–307)

## 2023-10-06 ENCOUNTER — Encounter: Payer: Self-pay | Admitting: Hematology

## 2023-10-06 ENCOUNTER — Encounter: Payer: Self-pay | Admitting: Nurse Practitioner

## 2023-10-10 ENCOUNTER — Encounter: Payer: Self-pay | Admitting: Internal Medicine

## 2023-11-03 ENCOUNTER — Emergency Department (HOSPITAL_COMMUNITY): Payer: Federal, State, Local not specified - PPO

## 2023-11-03 ENCOUNTER — Other Ambulatory Visit: Payer: Self-pay

## 2023-11-03 ENCOUNTER — Encounter (HOSPITAL_COMMUNITY): Payer: Self-pay | Admitting: Emergency Medicine

## 2023-11-03 ENCOUNTER — Emergency Department (HOSPITAL_COMMUNITY)
Admission: EM | Admit: 2023-11-03 | Discharge: 2023-11-03 | Disposition: A | Discharge status: Home / Self Care | Payer: Federal, State, Local not specified - PPO | Attending: Emergency Medicine | Admitting: Emergency Medicine

## 2023-11-03 DIAGNOSIS — R109 Unspecified abdominal pain: Secondary | ICD-10-CM | POA: Diagnosis present

## 2023-11-03 DIAGNOSIS — N200 Calculus of kidney: Secondary | ICD-10-CM | POA: Insufficient documentation

## 2023-11-03 LAB — COMPREHENSIVE METABOLIC PANEL
ALT: 11 U/L (ref 0–44)
AST: 19 U/L (ref 15–41)
Albumin: 3.6 g/dL (ref 3.5–5.0)
Alkaline Phosphatase: 100 U/L (ref 38–126)
Anion gap: 8 (ref 5–15)
BUN: 11 mg/dL (ref 8–23)
CO2: 28 mmol/L (ref 22–32)
Calcium: 9.2 mg/dL (ref 8.9–10.3)
Chloride: 105 mmol/L (ref 98–111)
Creatinine, Ser: 0.77 mg/dL (ref 0.44–1.00)
GFR, Estimated: >60 mL/min (ref >60)
Glucose, Bld: 118 mg/dL — ABNORMAL HIGH (ref 70–99)
Potassium: 3.9 mmol/L (ref 3.5–5.1)
Sodium: 141 mmol/L (ref 135–145)
Total Bilirubin: 0.8 mg/dL (ref 0.0–1.2)
Total Protein: 6.9 g/dL (ref 6.5–8.1)

## 2023-11-03 LAB — CBC
HCT: 44.6 % (ref 36.0–46.0)
Hemoglobin: 14.1 g/dL (ref 12.0–15.0)
MCH: 29 pg (ref 26.0–34.0)
MCHC: 31.6 g/dL (ref 30.0–36.0)
MCV: 91.8 fL (ref 80.0–100.0)
Platelets: 271 10*3/uL (ref 150–400)
RBC: 4.86 MIL/uL (ref 3.87–5.11)
RDW: 13.2 % (ref 11.5–15.5)
WBC: 5.8 10*3/uL (ref 4.0–10.5)
nRBC: 0 % (ref 0.0–0.2)

## 2023-11-03 LAB — URINALYSIS, ROUTINE W REFLEX MICROSCOPIC
Bilirubin Urine: NEGATIVE
Glucose, UA: NEGATIVE mg/dL
Ketones, ur: NEGATIVE mg/dL
Leukocytes,Ua: NEGATIVE
Nitrite: NEGATIVE
Protein, ur: NEGATIVE mg/dL
RBC / HPF: >50 RBC/hpf (ref 0–5)
Specific Gravity, Urine: 1.011 (ref 1.005–1.030)
pH: 8 (ref 5.0–8.0)

## 2023-11-03 LAB — LIPASE, BLOOD: Lipase: 25 U/L (ref 11–51)

## 2023-11-03 MED ORDER — OXYCODONE-ACETAMINOPHEN 5-325 MG PO TABS
1.0000 | ORAL_TABLET | Freq: Four times a day (QID) | ORAL | 0 refills | Status: DC | PRN
Start: 2023-11-03 — End: 2024-05-26

## 2023-11-03 MED ORDER — OXYCODONE-ACETAMINOPHEN 5-325 MG PO TABS: 1.0000 | ORAL_TABLET | Freq: Once | ORAL | Status: DC

## 2023-11-03 MED ORDER — FENTANYL CITRATE PF 50 MCG/ML IJ SOSY
50.0000 ug | PREFILLED_SYRINGE | Freq: Once | INTRAMUSCULAR | Status: AC
  Administered 2023-11-03: 50 ug via INTRAVENOUS
  Filled 2023-11-03: qty 1

## 2023-11-03 MED ORDER — KETOROLAC TROMETHAMINE 15 MG/ML IJ SOLN
15.0000 mg | Freq: Once | INTRAMUSCULAR | Status: AC
  Administered 2023-11-03: 15 mg via INTRAVENOUS
  Filled 2023-11-03: qty 1

## 2023-11-03 MED ORDER — IOHEXOL 300 MG/ML  SOLN
100.0000 mL | Freq: Once | INTRAMUSCULAR | Status: AC | PRN
  Administered 2023-11-03: 100 mL via INTRAVENOUS

## 2023-11-03 MED ORDER — TAMSULOSIN HCL 0.4 MG PO CAPS: 0.4000 mg | ORAL_CAPSULE | Freq: Every day | ORAL | 0 refills | Status: AC

## 2023-11-03 NOTE — ED Triage Notes (Signed)
 Pt started having diarrhea and right sided abdominal pain around 830am. Denies N/V.

## 2023-11-03 NOTE — Discharge Instructions (Signed)
 Please call the urology clinic office tomorrow.  Pain medicine has been prescribed regarding your stone.  Your stone was in the proximal ureter and still has a ways to pass and given your ongoing pain you may need further procedures with urology to aid in stone passage.

## 2023-11-03 NOTE — ED Notes (Signed)
 Patient transported to CT

## 2023-11-03 NOTE — ED Provider Notes (Signed)
 [place] EMERGENCY DEPARTMENT AT [facility] Provider Note   CSN: [id number] Arrival date & time: 11/03/23  1058     History  Chief Complaint  Patient presents with   Abdominal Pain    Summer [name] is a 73 y.o. female, hx of diverticulitis, fibromyalgia, who presents to the ED 2/2 to right flank pain, starting around 8:30 AM this morning.  She states the right flank pain, followed after having 5-6 episodes of loose stools, she states the pain has been persistent since 8:30 AM this morning, and is sharp, achy, and radiates to her upper abdomen.  She has not tried anything for the pain, and not use any heat or ice.  Also reports that she has a history of diverticulitis, with perforation, with his history of colectomy, so she is worried about this.  She denies any kind of nausea, vomiting, constipation, urinary symptoms.  She does however state that when she did use the toilet about 10 minutes ago, there was blood in her stool.  Home Medications Prior to Admission medications   Medication Sig Start Date End Date Taking? Authorizing Provider  anastrozole (ARIMIDEX) 1 MG tablet TAKE 1 TABLET BY MOUTH EVERY DAY 12/19/22   Pollyann Samples, NP  baclofen (LIORESAL) 10 MG tablet Take 1 tablet (10 mg total) by mouth 2 (two) times daily. 06/26/23   Glean Salvo, NP  diclofenac Sodium (VOLTAREN) 1 % GEL Apply 2 g topically 4 (four) times daily. 10/13/20   Cristie Hem, PA-C  DULoxetine (CYMBALTA) 60 MG capsule TAKE 1 CAPSULE BY MOUTH EVERY DAY 06/26/23   Glean Salvo, NP  gabapentin (NEURONTIN) 600 MG tablet TAKE 2 TABLETS BY MOUTH 3 TIMES A DAY 06/25/23   Glean Salvo, NP  Melatonin 3 MG CAPS Take 1 capsule (3 mg total) by mouth at bedtime. 04/15/22   Albertine Grates, MD  pravastatin (PRAVACHOL) 20 MG tablet Take 1 tablet (20 mg total) by mouth daily. 05/14/23   Myrlene Broker, MD  venlafaxine XR (EFFEXOR-XR) 37.5 MG 24 hr capsule Take 1 capsule (37.5 mg total) by mouth  daily with breakfast. 05/30/23   Malachy Mood, MD      Allergies    Keflex [cephalexin] and Clindamycin hcl    Review of Systems   Review of Systems  Gastrointestinal:  Positive for abdominal pain. Negative for nausea and vomiting.    Physical Exam Updated Vital Signs BP (!) 148/68 (BP Location: Left Arm)   Pulse 76   Temp 98.5 F (36.9 C) (Oral)   Resp 18   Ht 5\' 6"  (1.676 m)   Wt 104.3 kg   SpO2 99%   BMI 37.12 kg/m  Physical Exam Vitals and nursing note reviewed.  Constitutional:      General: She is not in acute distress.    Appearance: She is well-developed.  HENT:     Head: Normocephalic and atraumatic.  Eyes:     Conjunctiva/sclera: Conjunctivae normal.  Cardiovascular:     Rate and Rhythm: Normal rate and regular rhythm.     Heart sounds: No murmur heard. Pulmonary:     Effort: Pulmonary effort is normal. No respiratory distress.     Breath sounds: Normal breath sounds.  Abdominal:     Palpations: Abdomen is soft.     Tenderness: There is abdominal tenderness in the right upper quadrant and right lower quadrant. There is no guarding or rebound.     Comments: +R flank pain, no ecchymoses  or rash  Musculoskeletal:        General: No swelling.     Cervical back: Neck supple.  Skin:    General: Skin is warm and dry.     Capillary Refill: Capillary refill takes less than 2 seconds.  Neurological:     Mental Status: She is alert.  Psychiatric:        Mood and Affect: Mood normal.     ED Results / Procedures / Treatments   Labs (all labs ordered are listed, but only abnormal results are displayed) Labs Reviewed  COMPREHENSIVE METABOLIC PANEL - Abnormal; Notable for the following components:      Result Value   Glucose, Bld 118 (*)    All other components within normal limits  URINALYSIS, ROUTINE W REFLEX MICROSCOPIC - Abnormal; Notable for the following components:   Color, Urine STRAW (*)    APPearance HAZY (*)    Hgb urine dipstick LARGE (*)     Bacteria, UA RARE (*)    All other components within normal limits  LIPASE, BLOOD  CBC    EKG None  Radiology CT ABDOMEN PELVIS W CONTRAST Result Date: 11/03/2023 CLINICAL DATA:  Right-sided abdominal pain EXAM: CT ABDOMEN AND PELVIS WITH CONTRAST TECHNIQUE: Multidetector CT imaging of the abdomen and pelvis was performed using the standard protocol following bolus administration of intravenous contrast. RADIATION DOSE REDUCTION: This exam was performed according to the departmental dose-optimization program which includes automated exposure control, adjustment of the mA and/or kV according to patient size and/or use of iterative reconstruction technique. CONTRAST:  OMNIPAQUE IOHEXOL 300 MG/ML  SOLN COMPARISON:  CT abdomen and pelvis 04/13/2022 FINDINGS: Lower chest: No acute abnormality. There is a calcified granuloma in the right lower lobe. Hepatobiliary: No focal liver abnormality is seen. No gallstones, gallbladder wall thickening, or biliary dilatation. Pancreas: Unremarkable. No pancreatic ductal dilatation or surrounding inflammatory changes. Spleen: Normal in size without focal abnormality. Adrenals/Urinary Tract: There is a 6 mm calculus in the proximal right ureter. There is mild right-sided hydronephrosis. There is a cyst in the superior pole the left kidney measuring 2.3 cm. Additional punctate nonobstructing renal calculi are seen. Bladder is within normal limits. Stomach/Bowel: Stomach is within normal limits. Appendix appears normal. No evidence of bowel wall thickening, distention, or inflammatory changes. Diffuse colonic diverticulosis is present. Sigmoid colon anastomosis is present. Vascular/Lymphatic: Aortic atherosclerosis. No enlarged abdominal or pelvic lymph nodes. Reproductive: Status post hysterectomy. No adnexal masses. Other: There is a Robie Oats fat containing ventral hernia lateral to the left rectus musculature. There is no ascites. Musculoskeletal: Degenerative changes  affect the spine most significant at L1-L2. IMPRESSION: 1. 6 mm calculus in the proximal right ureter with mild obstructive uropathy. 2. Additional nonobstructing renal calculi. 3. Left Bosniak I benign renal cyst measuring 2.3 cm. No follow-up imaging recommended. 4. Sigmoid colon diverticulosis. Aortic Atherosclerosis (ICD10-I70.0). Electronically Signed   By: Darliss Cheney M.D.   On: 11/03/2023 20:00    Procedures Procedures    Medications Ordered in ED Medications  fentaNYL (SUBLIMAZE) injection 50 mcg (has no administration in time range)  ketorolac (TORADOL) 15 MG/ML injection 15 mg (15 mg Intravenous Given 11/03/23 1614)  iohexol (OMNIPAQUE) 300 MG/ML solution 100 mL (100 mLs Intravenous Contrast Given 11/03/23 1935)    ED Course/ Medical Decision Making/ A&P  Medical Decision Making Patient is a 73 year old female, here for right flank pain, that is been going on for the last few hours, started 830, after a few loose stools.  Overall well-appearing on exam, has tenderness to palpation of the right flank.  But appears comfortable.  She is concerned because she has had a history of diverticulitis, with perforation, and is concerned that is what is happening again.  Will obtain a CT ab pelvis, for further evaluation.  Amount and/or Complexity of Data Reviewed Labs: ordered.    Details: Blood work unremarkable, urine shows blood in it, rare bacteria, no nitrates, or white blood cell Radiology: ordered.    Details: CT abdomen pelvis shows 6 mm proximal right ureter Discussion of management or test interpretation with external provider(s): Patient is a 73 year old female, here for the 6 mm proximal right ureter stone, CT ab pelvis was significantly delayed, given ultrasound IV access.  I was not informed by nursing staff, that the patient was unable to have an IV, and those around 5 or 6:00 today, as soon as I heard, I did message the charge nurse, and spoke  with her, and IV team was called immediately.  IV team was placing an IV, and got patient got ultrasound, IV, with CT abdomen pelvis showing right UVJ stone.  Patient still having pain, second dose of pain medication ordered, Dr. Karene Fry to assume care  Risk Prescription drug management.    Final Clinical Impression(s) / ED Diagnoses Final diagnoses:  Kidney stone    Rx / DC Orders ED Discharge Orders     None         Jolinda Pinkstaff, [place], PA 11/03/23 2020    Ernie Avena, MD 11/03/23 2033

## 2023-11-04 ENCOUNTER — Emergency Department (HOSPITAL_COMMUNITY)
Admission: EM | Admit: 2023-11-04 | Discharge: 2023-11-04 | Discharge status: Against Medical Advice | Payer: Federal, State, Local not specified - PPO

## 2023-11-04 ENCOUNTER — Telehealth: Payer: Self-pay

## 2023-11-04 NOTE — Transitions of Care (Post Inpatient/ED Visit) (Signed)
   11/04/2023  Name: Summer [name] MRN: [id number] DOB: [birth year]  Today's TOC FU Call Status:   Patient's Name and Date of Birth confirmed.  Transition Care Management Follow-up Telephone Call Date of Discharge: 11/03/23 Discharge Facility: [facility]) Type of Discharge: Emergency Department Reason for ED Visit: Renal Renal Diagnosis:  (Kidney Stone) How have you been since you were released from the hospital?: Same Any questions or concerns?: No  Items Reviewed: Did you receive and understand the discharge instructions provided?: Yes Medications obtained,verified, and reconciled?: Yes (Medications Reviewed) Any new allergies since your discharge?: No Dietary orders reviewed?: NA Do you have support at home?: Yes  Medications Reviewed Today: Medications Reviewed Today     Reviewed by Marinus Maw, CMA (Certified Medical Assistant) on 11/04/23 at 1343  Med List Status: <None>   Medication Order Taking? Sig Documenting Provider Last Dose Status Informant  anastrozole (ARIMIDEX) 1 MG tablet [id number] Yes TAKE 1 TABLET BY MOUTH EVERY DAY Pollyann Samples, NP Taking Active   baclofen (LIORESAL) 10 MG tablet [id number] Yes Take 1 tablet (10 mg total) by mouth 2 (two) times daily. Glean Salvo, NP Taking Active   diclofenac Sodium (VOLTAREN) 1 % GEL [id number] Yes Apply 2 g topically 4 (four) times daily. Cristie Hem, PA-C Taking Active Self  DULoxetine (CYMBALTA) 60 MG capsule [id number] Yes TAKE 1 CAPSULE BY MOUTH EVERY DAY Glean Salvo, NP Taking Active   gabapentin (NEURONTIN) 600 MG tablet [id number] Yes TAKE 2 TABLETS BY MOUTH 3 TIMES A DAY Glean Salvo, NP Taking Active   Melatonin 3 MG CAPS [id number] Yes Take 1 capsule (3 mg total) by mouth at bedtime. Albertine Grates, MD Taking Active   oxyCODONE-acetaminophen (PERCOCET/ROXICET) 5-325 MG tablet [id number] Yes Take 1 tablet by mouth every 6 (six) hours as needed for severe pain (pain score 7-10). Ernie Avena,  MD Taking Active   pravastatin (PRAVACHOL) 20 MG tablet [id number] Yes Take 1 tablet (20 mg total) by mouth daily. Myrlene Broker, MD Taking Active   tamsulosin [facility]) 0.4 MG CAPS capsule [id number] Yes Take 1 capsule (0.4 mg total) by mouth daily for 5 days. Ernie Avena, MD Taking Active   venlafaxine XR (EFFEXOR-XR) 37.5 MG 24 hr capsule [id number] Yes Take 1 capsule (37.5 mg total) by mouth daily with breakfast. Malachy Mood, MD Taking Active             Home Care and Equipment/Supplies: Were Home Health Services Ordered?: NA Any new equipment or medical supplies ordered?: NA  Functional Questionnaire: Do you need assistance with bathing/showering or dressing?: No Do you need assistance with meal preparation?: No Do you need assistance with eating?: No Do you have difficulty maintaining continence: No Do you need assistance with getting out of bed/getting out of a chair/moving?: No Do you have difficulty managing or taking your medications?: No  Follow up appointments reviewed: PCP Follow-up appointment confirmed?: NA Specialist Hospital Follow-up appointment confirmed?: NA Do you need transportation to your follow-up appointment?: No Do you understand care options if your condition(s) worsen?: Yes-patient verbalized understanding    SIGNATURE: Jordan Hawks

## 2023-11-04 NOTE — ED Notes (Signed)
 Patient said she is not waiting in the lobby all day.

## 2023-11-05 ENCOUNTER — Other Ambulatory Visit: Payer: Self-pay | Admitting: Urology

## 2023-11-05 NOTE — Progress Notes (Signed)
 Left voicemail for patient to inform her that we will try to call her again tomorrow.

## 2023-11-05 NOTE — Progress Notes (Signed)
 Left voicemail with information to arrive at 0800 for procedure. Nothing to eat after MN, clear liquids only from MN until 0600, to stop voltaren, and don't take any NSAIDS and / or pepto bismol starting today.

## 2023-11-06 ENCOUNTER — Encounter: Payer: Self-pay | Admitting: Internal Medicine

## 2023-11-06 NOTE — Progress Notes (Signed)
 Left voicemail for patient instructing to come to main entrance and check in at that desk, and to being her blue folder.

## 2023-11-07 NOTE — Progress Notes (Signed)
 Attempted to make preop call for lithotripsy scheduled for 11/08/2023. Arrival time 0800 at [facility], no food after midnight and clear liquids until 0600. Left contact numbers for today and tomorrow. Voice mail cutoff for further information.

## 2023-11-08 ENCOUNTER — Ambulatory Visit (HOSPITAL_COMMUNITY): Payer: Federal, State, Local not specified - PPO

## 2023-11-08 ENCOUNTER — Encounter (HOSPITAL_COMMUNITY)
Admission: RE | Disposition: A | Discharge status: Home / Self Care | Payer: Self-pay | Source: Home / Self Care | Attending: Urology

## 2023-11-08 ENCOUNTER — Other Ambulatory Visit: Payer: Self-pay

## 2023-11-08 ENCOUNTER — Encounter (HOSPITAL_COMMUNITY): Payer: Self-pay | Admitting: Urology

## 2023-11-08 ENCOUNTER — Ambulatory Visit (HOSPITAL_COMMUNITY)
Admission: RE | Admit: 2023-11-08 | Discharge: 2023-11-08 | Disposition: A | Discharge status: Home / Self Care | Payer: Federal, State, Local not specified - PPO | Attending: Urology | Admitting: Urology

## 2023-11-08 DIAGNOSIS — N201 Calculus of ureter: Secondary | ICD-10-CM | POA: Insufficient documentation

## 2023-11-08 HISTORY — PX: EXTRACORPOREAL SHOCK WAVE LITHOTRIPSY: SHX1557

## 2023-11-08 SURGERY — LITHOTRIPSY, ESWL
Anesthesia: LOCAL | Laterality: Right

## 2023-11-08 MED ORDER — SODIUM CHLORIDE 0.9 % IV SOLN: INTRAVENOUS | Status: DC

## 2023-11-08 MED ORDER — DIAZEPAM 5 MG PO TABS
10.0000 mg | ORAL_TABLET | ORAL | Status: AC
  Administered 2023-11-08: 10 mg via ORAL
  Filled 2023-11-08: qty 2

## 2023-11-08 MED ORDER — CIPROFLOXACIN HCL 500 MG PO TABS
500.0000 mg | ORAL_TABLET | ORAL | Status: AC
  Administered 2023-11-08: 500 mg via ORAL
  Filled 2023-11-08: qty 1

## 2023-11-08 MED ORDER — DIPHENHYDRAMINE HCL 25 MG PO CAPS
25.0000 mg | ORAL_CAPSULE | ORAL | Status: AC
  Administered 2023-11-08: 25 mg via ORAL
  Filled 2023-11-08: qty 1

## 2023-11-08 NOTE — Progress Notes (Signed)
 The patient is ok to return to work on 11/11/23.

## 2023-11-08 NOTE — Discharge Instructions (Signed)

## 2023-11-08 NOTE — Op Note (Signed)
 ESWL Operative Note  Treating Physician: Rhoderick Moody, MD  Pre-op diagnosis: 6 mm proximal right ureteral stone  Post-op diagnosis: Same.    Procedure: RIGHT ESWL.  Right ureteral stone was difficult to identify on live fluoro and required IV contrast administration to aide in identification during ESWL.   See [employer] OP note scanned into chart. Also because of the size, density, location and other factors that cannot be anticipated I feel this will likely be a staged procedure. This fact supersedes any indication in the scanned [place] stone operative note to the contrary

## 2023-11-08 NOTE — H&P (Signed)
See scanned [facility] documents for H&P.   

## 2023-11-13 ENCOUNTER — Encounter (HOSPITAL_COMMUNITY): Payer: Self-pay | Admitting: Urology

## 2023-11-26 ENCOUNTER — Encounter: Payer: Self-pay | Admitting: Internal Medicine

## 2023-11-26 ENCOUNTER — Ambulatory Visit: Admitting: Internal Medicine

## 2023-11-26 VITALS — BP 120/82 | HR 102 | Temp 98.5°F | Ht 66.0 in | Wt 233.0 lb

## 2023-11-26 DIAGNOSIS — R0789 Other chest pain: Secondary | ICD-10-CM | POA: Diagnosis not present

## 2023-11-26 DIAGNOSIS — I700 Atherosclerosis of aorta: Secondary | ICD-10-CM | POA: Diagnosis not present

## 2023-11-26 DIAGNOSIS — N200 Calculus of kidney: Secondary | ICD-10-CM

## 2023-11-26 NOTE — Assessment & Plan Note (Signed)
 Discussed with her this finding on CT and reviewed images with her. She is taking pravastatin 20 mg daily and this will be continued.

## 2023-11-26 NOTE — Assessment & Plan Note (Signed)
 EKG done and stable at today's visit. Suspect non-cardiac in nature. This is resolved so medication is not needed.

## 2023-11-26 NOTE — Progress Notes (Signed)
   Subjective:   Patient ID: Summer Summer [name], female    DOB: [birth year], 73 y.o.   MRN: [id number]  Hand Pain  Pertinent negatives include no chest pain.   The patient is a 73 YO female coming in for follow up Er (kidney stones, follow up CT urology showed they are still present she is getting another procedure for stent in near future). She is still having some stomach pain. Had an episode of chest pain and right hand pain after the lithotripsy. This is now resolved.   PMH, [facility], social history reviewed and updated  Review of Systems  Constitutional: Negative.   HENT: Negative.    Eyes: Negative.   Respiratory:  Negative for cough, chest tightness and shortness of breath.   Cardiovascular:  Negative for chest pain, palpitations and leg swelling.  Gastrointestinal:  Positive for abdominal pain. Negative for abdominal distention, constipation, diarrhea, nausea and vomiting.  Musculoskeletal: Negative.   Skin: Negative.   Neurological: Negative.   Psychiatric/Behavioral: Negative.      Objective:  Physical Exam Constitutional:      Appearance: She is well-developed.  HENT:     Head: Normocephalic and atraumatic.  Cardiovascular:     Rate and Rhythm: Normal rate and regular rhythm.  Pulmonary:     Effort: Pulmonary effort is normal. No respiratory distress.     Breath sounds: Normal breath sounds. No wheezing or rales.  Abdominal:     General: Bowel sounds are normal. There is no distension.     Palpations: Abdomen is soft.     Tenderness: There is no abdominal tenderness. There is no rebound.     Comments: Right flank pain  Musculoskeletal:     Cervical back: Normal range of motion.  Skin:    General: Skin is warm and dry.  Neurological:     Mental Status: She is alert and oriented to person, place, and time.     Coordination: Coordination normal.     Vitals:   11/26/23 1419  BP: 120/82  Summer [name]: (!) 102  Temp: 98.5 F (36.9 C)  TempSrc: Oral  SpO2: 97%   Weight: 233 lb (105.7 kg)  Height: 5\' 6"  (1.676 m)   EKG: Rate 95, axis normal, interval QTC 460 no change from prior, sinus, no st or t wave changes, no significant change compared to prior 2023   Assessment & Plan:  Visit time 20 minutes in face to face communication with patient and coordination of care, additional 10 minutes spent in record review, coordination or care, ordering tests, communicating/referring to other healthcare professionals, documenting in medical records all on the same day of the visit for total time 30 minutes spent on the visit.

## 2023-11-26 NOTE — Assessment & Plan Note (Signed)
 She is having further procedures with urology in near future and we discussed.

## 2023-12-02 ENCOUNTER — Other Ambulatory Visit: Payer: Self-pay | Admitting: Urology

## 2023-12-10 NOTE — Patient Instructions (Signed)
 SURGICAL WAITING ROOM VISITATION  Patients having surgery or a procedure may have no more than 2 support people in the waiting area - these visitors may rotate.    Children under the age of 38 must have an adult with them who is not the patient.  Due to an increase in RSV and influenza rates and associated hospitalizations, children ages 32 and under may not visit patients in [facility] hospitals.  Visitors with respiratory illnesses are discouraged from visiting and should remain at home.  If the patient needs to stay at the hospital during part of their recovery, the visitor guidelines for inpatient rooms apply. Pre-op nurse will coordinate an appropriate time for 1 support person to accompany patient in pre-op.  This support person may not rotate.    Please refer to the [facility] website for the visitor guidelines for Inpatients (after your surgery is over and you are in a regular room).       Your procedure is scheduled on:  12/16/2023    Report to [facility] Main Entrance    Report to admitting at  0515 AM   Call this number if you have problems the morning of surgery [phone]   Do not eat food  or drink liquids :After Midnight.                                                         If you have questions, please contact your surgeon's office.       Oral Hygiene is also important to reduce your risk of infection.                                    Remember - BRUSH YOUR TEETH THE MORNING OF SURGERY WITH YOUR REGULAR TOOTHPASTE  DENTURES WILL BE REMOVED PRIOR TO SURGERY PLEASE DO NOT APPLY "Poly grip" OR ADHESIVES!!!   Do NOT smoke after Midnight   Stop all vitamins and herbal supplements 7 days before surgery.   Take these medicines the morning of surgery with A SIP OF WATER:  cymbalta, gabapentin, effexor   DO NOT TAKE ANY ORAL DIABETIC MEDICATIONS DAY OF YOUR SURGERY  Bring CPAP mask and tubing day of surgery.                              You  may not have any metal on your body including hair pins, jewelry, and body piercing             Do not wear make-up, lotions, powders, perfumes/cologne, or deodorant  Do not wear nail polish including gel and S&S, artificial/acrylic nails, or any other type of covering on natural nails including finger and toenails. If you have artificial nails, gel coating, etc. that needs to be removed by a nail salon please have this removed prior to surgery or surgery may need to be canceled/ delayed if the surgeon/ anesthesia feels like they are unable to be safely monitored.   Do not shave  48 hours prior to surgery.               Men may shave face and neck.   Do not bring valuables to the  hospital. [facility] IS NOT             RESPONSIBLE   FOR VALUABLES.   Contacts, glasses, dentures or bridgework may not be worn into surgery.   Bring small overnight bag day of surgery.   DO NOT BRING YOUR HOME MEDICATIONS TO THE HOSPITAL. PHARMACY WILL DISPENSE MEDICATIONS LISTED ON YOUR MEDICATION LIST TO YOU DURING YOUR ADMISSION IN THE HOSPITAL!    Patients discharged on the day of surgery will not be allowed to drive home.  Someone NEEDS to stay with you for the first 24 hours after anesthesia.   Special Instructions: Bring a copy of your healthcare power of attorney and living will documents the day of surgery if you haven't scanned them before.              Please read over the following fact sheets you were given: IF YOU HAVE QUESTIONS ABOUT YOUR PRE-OP INSTRUCTIONS PLEASE CALL [phone]   If you received a COVID test during your pre-op visit  it is requested that you wear a mask when out in public, stay away from anyone that may not be feeling well and notify your surgeon if you develop symptoms. If you test positive for Covid or have been in contact with anyone that has tested positive in the last 10 days please notify you surgeon.    [facility] - Preparing for Surgery Before surgery, you can  play an important role.  Because skin is not sterile, your skin needs to be as free of germs as possible.  You can reduce the number of germs on your skin by washing with CHG (chlorahexidine gluconate) soap before surgery.  CHG is an antiseptic cleaner which kills germs and bonds with the skin to continue killing germs even after washing. Please DO NOT use if you have an allergy to CHG or antibacterial soaps.  If your skin becomes reddened/irritated stop using the CHG and inform your nurse when you arrive at Short Stay. Do not shave (including legs and underarms) for at least 48 hours prior to the first CHG shower.  You may shave your face/neck. Please follow these instructions carefully:  1.  Shower with CHG Soap the night before surgery and the  morning of Surgery.  2.  If you choose to wash your hair, wash your hair first as usual with your  normal  shampoo.  3.  After you shampoo, rinse your hair and body thoroughly to remove the  shampoo.                           4.  Use CHG as you would any other liquid soap.  You can apply chg directly  to the skin and wash                       Gently with a scrungie or clean washcloth.  5.  Apply the CHG Soap to your body ONLY FROM THE NECK DOWN.   Do not use on face/ open                           Wound or open sores. Avoid contact with eyes, ears mouth and genitals (private parts).                       Wash face,  Genitals (private parts) with your normal  soap.             6.  Wash thoroughly, paying special attention to the area where your surgery  will be performed.  7.  Thoroughly rinse your body with warm water from the neck down.  8.  DO NOT shower/wash with your normal soap after using and rinsing off  the CHG Soap.                9.  Pat yourself dry with a clean towel.            10.  Wear clean pajamas.            11.  Place clean sheets on your bed the night of your first shower and do not  sleep with pets. Day of Surgery : Do not apply any  lotions/deodorants the morning of surgery.  Please wear clean clothes to the hospital/surgery center.  FAILURE TO FOLLOW THESE INSTRUCTIONS MAY RESULT IN THE CANCELLATION OF YOUR SURGERY PATIENT SIGNATURE_________________________________  NURSE SIGNATURE__________________________________  ________________________________________________________________________

## 2023-12-10 NOTE — Progress Notes (Signed)
 Anesthesia Review:  PCP: Hillard Danker, LOV 11/26/23  Cardiologist : none   PPM/ ICD: Device Orders: Rep Notified:  Chest x-ray : EKG : 11/27/2023  Echo : 2021  CT Cors- 2020  Stress test: Cardiac Cath :   Activity level: can do a flight of stairs without difficutly  Sleep Study/ CPAP : none  Fasting Blood Sugar :      / Checks Blood Sugar -- times a day:    Blood Thinner/ Instructions /Last Dose: ASA / Instructions/ Last Dose :    11/08/23- lithotripsy    Pt was 18 monutes late for preop appt.     NO B/p etc to right arm.

## 2023-12-11 ENCOUNTER — Encounter: Payer: Self-pay | Admitting: Hematology

## 2023-12-12 ENCOUNTER — Encounter: Payer: Self-pay | Admitting: Hematology

## 2023-12-13 ENCOUNTER — Encounter (HOSPITAL_COMMUNITY)
Admission: RE | Admit: 2023-12-13 | Discharge: 2023-12-13 | Disposition: A | Discharge status: Home / Self Care | Source: Ambulatory Visit | Attending: Urology | Admitting: Urology

## 2023-12-13 ENCOUNTER — Encounter (HOSPITAL_COMMUNITY): Payer: Self-pay

## 2023-12-13 ENCOUNTER — Other Ambulatory Visit: Payer: Self-pay

## 2023-12-13 ENCOUNTER — Ambulatory Visit: Payer: Federal, State, Local not specified - PPO

## 2023-12-13 VITALS — BP 160/76 | HR 77 | Temp 99.0°F | Resp 16 | Ht 66.0 in | Wt 215.0 lb

## 2023-12-13 VITALS — Ht 66.0 in | Wt 230.0 lb

## 2023-12-13 DIAGNOSIS — I10 Essential (primary) hypertension: Secondary | ICD-10-CM | POA: Diagnosis not present

## 2023-12-13 DIAGNOSIS — Z79811 Long term (current) use of aromatase inhibitors: Secondary | ICD-10-CM | POA: Diagnosis not present

## 2023-12-13 DIAGNOSIS — N201 Calculus of ureter: Secondary | ICD-10-CM | POA: Diagnosis present

## 2023-12-13 DIAGNOSIS — M797 Fibromyalgia: Secondary | ICD-10-CM | POA: Diagnosis not present

## 2023-12-13 DIAGNOSIS — D649 Anemia, unspecified: Secondary | ICD-10-CM | POA: Diagnosis not present

## 2023-12-13 DIAGNOSIS — Z6837 Body mass index (BMI) 37.0-37.9, adult: Secondary | ICD-10-CM | POA: Diagnosis not present

## 2023-12-13 DIAGNOSIS — Z01818 Encounter for other preprocedural examination: Secondary | ICD-10-CM

## 2023-12-13 DIAGNOSIS — Z01812 Encounter for preprocedural laboratory examination: Secondary | ICD-10-CM | POA: Diagnosis not present

## 2023-12-13 DIAGNOSIS — Z79899 Other long term (current) drug therapy: Secondary | ICD-10-CM | POA: Diagnosis not present

## 2023-12-13 DIAGNOSIS — M1990 Unspecified osteoarthritis, unspecified site: Secondary | ICD-10-CM | POA: Diagnosis not present

## 2023-12-13 DIAGNOSIS — Z791 Long term (current) use of non-steroidal anti-inflammatories (NSAID): Secondary | ICD-10-CM | POA: Diagnosis not present

## 2023-12-13 DIAGNOSIS — Z87442 Personal history of urinary calculi: Secondary | ICD-10-CM | POA: Diagnosis not present

## 2023-12-13 DIAGNOSIS — Z860100 Personal history of colon polyps, unspecified: Secondary | ICD-10-CM

## 2023-12-13 LAB — CBC
HCT: 44.2 % (ref 36.0–46.0)
Hemoglobin: 13.8 g/dL (ref 12.0–15.0)
MCH: 28.6 pg (ref 26.0–34.0)
MCHC: 31.2 g/dL (ref 30.0–36.0)
MCV: 91.7 fL (ref 80.0–100.0)
Platelets: 288 10*3/uL (ref 150–400)
RBC: 4.82 MIL/uL (ref 3.87–5.11)
RDW: 12.9 % (ref 11.5–15.5)
WBC: 6.2 10*3/uL (ref 4.0–10.5)
nRBC: 0 % (ref 0.0–0.2)

## 2023-12-13 LAB — BASIC METABOLIC PANEL WITH GFR
Anion gap: 9 (ref 5–15)
BUN: 12 mg/dL (ref 8–23)
CO2: 28 mmol/L (ref 22–32)
Calcium: 9.3 mg/dL (ref 8.9–10.3)
Chloride: 105 mmol/L (ref 98–111)
Creatinine, Ser: 0.7 mg/dL (ref 0.44–1.00)
GFR, Estimated: >60 mL/min (ref >60)
Glucose, Bld: 98 mg/dL (ref 70–99)
Potassium: 3.7 mmol/L (ref 3.5–5.1)
Sodium: 142 mmol/L (ref 135–145)

## 2023-12-13 MED ORDER — NA SULFATE-K SULFATE-MG SULF 17.5-3.13-1.6 GM/177ML PO SOLN: 1.0000 | Freq: Once | ORAL | 0 refills | Status: AC

## 2023-12-13 NOTE — Progress Notes (Signed)
No egg or soy allergy known to patient  No issues known to pt with past sedation with any surgeries or procedures Patient denies ever being told they had issues or difficulty with intubation  No FH of Malignant Hyperthermia Pt is not on diet pills Pt is not on  home 02  Pt is not on blood thinners  Pt denies issues with constipation  No A fib or A flutter Have any cardiac testing pending--no Pt can ambulate independntly Pt denies use of chewing tobacco Discussed diabetic I weight loss medication holds Discussed NSAID holds Checked BMI Pt instructed to use [employer] or GoodRx for a price reduction on prep  Patient's chart reviewed by Cathlyn Parsons CNRA prior to previsit and patient appropriate for the [facility].  Pre visit completed and red dot placed by patient's name on their procedure day (on provider's schedule).

## 2023-12-16 ENCOUNTER — Encounter (HOSPITAL_COMMUNITY): Payer: Self-pay | Admitting: Urology

## 2023-12-16 ENCOUNTER — Ambulatory Visit (HOSPITAL_COMMUNITY): Admitting: Certified Registered Nurse Anesthetist

## 2023-12-16 ENCOUNTER — Other Ambulatory Visit: Payer: Self-pay

## 2023-12-16 ENCOUNTER — Encounter (HOSPITAL_COMMUNITY)
Admission: RE | Disposition: A | Discharge status: Home / Self Care | Payer: Self-pay | Source: Home / Self Care | Attending: Urology

## 2023-12-16 ENCOUNTER — Ambulatory Visit (HOSPITAL_COMMUNITY): Payer: Self-pay | Admitting: Physician Assistant

## 2023-12-16 ENCOUNTER — Ambulatory Visit (HOSPITAL_COMMUNITY)
Admission: RE | Admit: 2023-12-16 | Discharge: 2023-12-16 | Disposition: A | Discharge status: Home / Self Care | Attending: Urology | Admitting: Urology

## 2023-12-16 ENCOUNTER — Ambulatory Visit (HOSPITAL_COMMUNITY)

## 2023-12-16 DIAGNOSIS — Z01812 Encounter for preprocedural laboratory examination: Secondary | ICD-10-CM | POA: Insufficient documentation

## 2023-12-16 DIAGNOSIS — M1990 Unspecified osteoarthritis, unspecified site: Secondary | ICD-10-CM | POA: Insufficient documentation

## 2023-12-16 DIAGNOSIS — Z87442 Personal history of urinary calculi: Secondary | ICD-10-CM | POA: Insufficient documentation

## 2023-12-16 DIAGNOSIS — N201 Calculus of ureter: Secondary | ICD-10-CM | POA: Diagnosis not present

## 2023-12-16 DIAGNOSIS — I10 Essential (primary) hypertension: Secondary | ICD-10-CM | POA: Insufficient documentation

## 2023-12-16 DIAGNOSIS — D649 Anemia, unspecified: Secondary | ICD-10-CM | POA: Insufficient documentation

## 2023-12-16 DIAGNOSIS — Z79811 Long term (current) use of aromatase inhibitors: Secondary | ICD-10-CM | POA: Insufficient documentation

## 2023-12-16 DIAGNOSIS — Z6837 Body mass index (BMI) 37.0-37.9, adult: Secondary | ICD-10-CM | POA: Insufficient documentation

## 2023-12-16 DIAGNOSIS — Z791 Long term (current) use of non-steroidal anti-inflammatories (NSAID): Secondary | ICD-10-CM | POA: Insufficient documentation

## 2023-12-16 DIAGNOSIS — Z01818 Encounter for other preprocedural examination: Secondary | ICD-10-CM

## 2023-12-16 DIAGNOSIS — Z79899 Other long term (current) drug therapy: Secondary | ICD-10-CM | POA: Insufficient documentation

## 2023-12-16 DIAGNOSIS — M797 Fibromyalgia: Secondary | ICD-10-CM | POA: Insufficient documentation

## 2023-12-16 HISTORY — PX: CYSTOSCOPY/RETROGRADE/URETEROSCOPY: SHX5316

## 2023-12-16 SURGERY — CYSTOSCOPY/RETROGRADE/URETEROSCOPY
Anesthesia: General | Site: Ureter | Laterality: Right

## 2023-12-16 MED ORDER — ACETAMINOPHEN 10 MG/ML IV SOLN
INTRAVENOUS | Status: DC | PRN
  Administered 2023-12-16: 1000 mg via INTRAVENOUS

## 2023-12-16 MED ORDER — DEXAMETHASONE SODIUM PHOSPHATE 10 MG/ML IJ SOLN
INTRAMUSCULAR | Status: DC | PRN
  Administered 2023-12-16: 10 mg via INTRAVENOUS

## 2023-12-16 MED ORDER — OXYCODONE HCL 5 MG PO TABS
5.0000 mg | ORAL_TABLET | Freq: Once | ORAL | Status: AC | PRN
  Administered 2023-12-16: 5 mg via ORAL

## 2023-12-16 MED ORDER — PROPOFOL 10 MG/ML IV BOLUS
INTRAVENOUS | Status: DC | PRN
  Administered 2023-12-16: 200 mg via INTRAVENOUS

## 2023-12-16 MED ORDER — FENTANYL CITRATE PF 50 MCG/ML IJ SOSY
25.0000 ug | PREFILLED_SYRINGE | INTRAMUSCULAR | Status: DC | PRN
  Administered 2023-12-16: 50 ug via INTRAVENOUS

## 2023-12-16 MED ORDER — CHLORHEXIDINE GLUCONATE 0.12 % MT SOLN
15.0000 mL | Freq: Once | OROMUCOSAL | Status: AC
  Administered 2023-12-16: 15 mL via OROMUCOSAL

## 2023-12-16 MED ORDER — DEXAMETHASONE SODIUM PHOSPHATE 10 MG/ML IJ SOLN
INTRAMUSCULAR | Status: AC
  Filled 2023-12-16: qty 1

## 2023-12-16 MED ORDER — OXYCODONE HCL 5 MG PO TABS
ORAL_TABLET | ORAL | Status: AC
  Filled 2023-12-16: qty 1

## 2023-12-16 MED ORDER — FENTANYL CITRATE (PF) 100 MCG/2ML IJ SOLN
INTRAMUSCULAR | Status: AC
Start: 2023-12-16 — End: ?
  Filled 2023-12-16: qty 2

## 2023-12-16 MED ORDER — CIPROFLOXACIN IN D5W 400 MG/200ML IV SOLN
400.0000 mg | INTRAVENOUS | Status: AC
  Administered 2023-12-16: 400 mg via INTRAVENOUS
  Filled 2023-12-16: qty 200

## 2023-12-16 MED ORDER — OXYCODONE HCL 5 MG/5ML PO SOLN: 5.0000 mg | Freq: Once | ORAL | Status: AC | PRN

## 2023-12-16 MED ORDER — FENTANYL CITRATE (PF) 100 MCG/2ML IJ SOLN
INTRAMUSCULAR | Status: DC | PRN
  Administered 2023-12-16: 25 ug via INTRAVENOUS

## 2023-12-16 MED ORDER — ACETAMINOPHEN 10 MG/ML IV SOLN
INTRAVENOUS | Status: AC
Start: 2023-12-16 — End: 2023-12-16
  Filled 2023-12-16: qty 100

## 2023-12-16 MED ORDER — KETOROLAC TROMETHAMINE 30 MG/ML IJ SOLN
INTRAMUSCULAR | Status: DC | PRN
  Administered 2023-12-16: 30 mg via INTRAVENOUS

## 2023-12-16 MED ORDER — 0.9 % SODIUM CHLORIDE (POUR BTL) OPTIME
TOPICAL | Status: DC | PRN
  Administered 2023-12-16: 1000 mL

## 2023-12-16 MED ORDER — LIDOCAINE HCL (PF) 2 % IJ SOLN
INTRAMUSCULAR | Status: AC
  Filled 2023-12-16: qty 5

## 2023-12-16 MED ORDER — ORAL CARE MOUTH RINSE: 15.0000 mL | Freq: Once | OROMUCOSAL | Status: AC

## 2023-12-16 MED ORDER — PROPOFOL 10 MG/ML IV BOLUS
INTRAVENOUS | Status: AC
  Filled 2023-12-16: qty 20

## 2023-12-16 MED ORDER — LIDOCAINE HCL (PF) 2 % IJ SOLN
INTRAMUSCULAR | Status: DC | PRN
  Administered 2023-12-16: 20 mg via INTRADERMAL

## 2023-12-16 MED ORDER — PHENYLEPHRINE 80 MCG/ML (10ML) SYRINGE FOR IV PUSH (FOR BLOOD PRESSURE SUPPORT)
PREFILLED_SYRINGE | INTRAVENOUS | Status: AC
  Filled 2023-12-16: qty 10

## 2023-12-16 MED ORDER — SODIUM CHLORIDE 0.9 % IR SOLN
Status: DC | PRN
  Administered 2023-12-16: 3000 mL

## 2023-12-16 MED ORDER — PHENYLEPHRINE HCL (PRESSORS) 10 MG/ML IV SOLN
INTRAVENOUS | Status: DC | PRN
  Administered 2023-12-16 (×4): 80 ug via INTRAVENOUS

## 2023-12-16 MED ORDER — ONDANSETRON HCL 4 MG/2ML IJ SOLN
INTRAMUSCULAR | Status: AC
  Filled 2023-12-16: qty 2

## 2023-12-16 MED ORDER — IOHEXOL 300 MG/ML  SOLN
INTRAMUSCULAR | Status: DC | PRN
Start: 2023-12-16 — End: 2023-12-16
  Administered 2023-12-16: 9 mL

## 2023-12-16 MED ORDER — FENTANYL CITRATE PF 50 MCG/ML IJ SOSY
PREFILLED_SYRINGE | INTRAMUSCULAR | Status: AC
  Filled 2023-12-16: qty 1

## 2023-12-16 MED ORDER — LACTATED RINGERS IV SOLN: INTRAVENOUS | Status: DC

## 2023-12-16 MED ORDER — ACETAMINOPHEN 500 MG PO TABS: 1000.0000 mg | ORAL_TABLET | Freq: Once | ORAL | Status: DC

## 2023-12-16 SURGICAL SUPPLY — 20 items
BAG URO CATCHER STRL LF (MISCELLANEOUS) ×1 IMPLANT
BASKET LASER NITINOL 1.9FR (BASKET) IMPLANT
BASKET ZERO TIP NITINOL 2.4FR (BASKET) IMPLANT
CATH URETERAL DUAL LUMEN 10F (MISCELLANEOUS) IMPLANT
CATH URETL OPEN END 6FR 70 (CATHETERS) ×1 IMPLANT
CLOTH BEACON ORANGE TIMEOUT ST (SAFETY) ×1 IMPLANT
EXTRACTOR STONE 1.7FRX115CM (UROLOGICAL SUPPLIES) IMPLANT
GLOVE BIO SURGEON STRL SZ7.5 (GLOVE) ×1 IMPLANT
GOWN STRL REUS W/ TWL XL LVL3 (GOWN DISPOSABLE) ×1 IMPLANT
GUIDEWIRE ANG ZIPWIRE 038X150 (WIRE) IMPLANT
GUIDEWIRE STR DUAL SENSOR (WIRE) ×1 IMPLANT
KIT TURNOVER KIT A (KITS) IMPLANT
LASER FIB FLEXIVA PULSE ID 365 (Laser) IMPLANT
MANIFOLD NEPTUNE II (INSTRUMENTS) ×1 IMPLANT
PACK CYSTO (CUSTOM PROCEDURE TRAY) ×1 IMPLANT
SHEATH NAVIGATOR HD 11/13X28 (SHEATH) IMPLANT
SHEATH NAVIGATOR HD 11/13X36 (SHEATH) IMPLANT
TRACTIP FLEXIVA PULS ID 200XHI (Laser) IMPLANT
TUBING CONNECTING 10 (TUBING) ×1 IMPLANT
TUBING UROLOGY SET (TUBING) ×1 IMPLANT

## 2023-12-16 NOTE — Discharge Instructions (Signed)

## 2023-12-16 NOTE — Transfer of Care (Signed)
 Immediate Anesthesia Transfer of Care Note  Patient: Summer [name]  Procedure(s) Performed: Procedure(s) with comments: CYSTOSCOPY/RETROGRADE/URETEROSCOPY (Right) - RIGHT RETROGRADE PYELOGRAM  Patient Location: PACU  Anesthesia Type:General  Level of Consciousness: Patient easily awoken, comfortable, cooperative, following commands, responds to stimulation.   Airway & Oxygen Therapy: Patient spontaneously breathing, ventilating well, oxygen via simple oxygen mask.  Post-op Assessment: Report given to PACU RN, vital signs reviewed and stable, moving all extremities.   Post vital signs: Reviewed and stable.  Complications: No apparent anesthesia complications  Vitals Value Taken Time  BP 128/65 12/16/23 0830  Temp 37.2 C 12/16/23 0829  Pulse 69 12/16/23 0832  Resp 11 12/16/23 0832  SpO2 98 % 12/16/23 0832  Vitals shown include unfiled device data.  Last Pain:  Vitals:   12/16/23 0829  TempSrc:   PainSc: 0-No pain      Patients Stated Pain Goal: 4 (12/16/23 0557)  Complications: No notable events documented.

## 2023-12-16 NOTE — Anesthesia Procedure Notes (Signed)
 Procedure Name: LMA Insertion Date/Time: 12/16/2023 7:57 AM  Performed by: Ludwig Lean, CRNAPre-anesthesia Checklist: Patient identified, Emergency Drugs available, Suction available and Patient being monitored Patient Re-evaluated:Patient Re-evaluated prior to induction Oxygen Delivery Method: Circle system utilized Preoxygenation: Pre-oxygenation with 100% oxygen Induction Type: IV induction Ventilation: Mask ventilation without difficulty LMA: LMA inserted LMA Size: 4.0 Number of attempts: 1 Placement Confirmation: positive ETCO2 and breath sounds checked- equal and bilateral Tube secured with: Tape Dental Injury: Teeth and Oropharynx as per pre-operative assessment

## 2023-12-16 NOTE — Op Note (Signed)
 Operative Note  Preoperative diagnosis:  1.  Right ureteral calculus  Postoperative diagnosis: 1.  Right ureteral calculus  Procedure(s): 1.  Cystoscopy with right retrograde pyelogram, right diagnostic ureteroscopy  Surgeon: Modena Slater, MD  Assistants: None  Anesthesia: General  Complications: None immediate  EBL: Minimal  Specimens: 1.  None  Drains/Catheters: 1.  None  Intraoperative findings: 1.  Normal urethra and bladder 2.  Right ureteroscopy revealed no ureteral calculi.  There were no renal calculi as well.    3.  Retrograde pyelogram revealed no evidence of filling defect or hydronephrosis  Indication: 73 year old female with a right ureteral calculus presents for the previously mentioned operation.  Description of procedure:  The patient was identified and consent was obtained.  The patient was taken to the operating room and placed in the supine position.  The patient was placed under general anesthesia.  Perioperative antibiotics were administered.  The patient was placed in dorsal lithotomy.  Patient was prepped and draped in a standard sterile fashion and a timeout was performed.  A 21 French rigid cystoscope was advanced into the urethra and into the bladder.  Complete cystoscopy was performed with no abnormal findings.  The right ureter was cannulated with a sensor wire which was advanced up to the kidney under fluoroscopic guidance.  Semirigid ureteroscopy was performed alongside the wire up the ureter to the renal pelvis and no calculi were seen.  I then advanced a digital ureteroscope over the wire under direct visualization and fluoroscopic guidance into the kidney.  Wire was withdrawn.  I inspected the entire kidney and no calculi were seen.  I shot a retrograde pyelogram through the scope with the findings noted above.  I then withdrew the scope visualizing the ureter upon removal.  Again there were no ureteral calculi and there was no significant ureteral  trauma identified.  I therefore elected not to leave a stent.  This concluded the operation.  Patient tolerated the procedure well was stable postoperatively.  Plan: Patient may follow-up in 1 month with renal ultrasound.

## 2023-12-16 NOTE — H&P (Signed)
 H&P  Chief Complaint: Right ureteral calculus  History of Present Illness: 73 year old female with a right ureteral calculus failed ESWL.  Presents for ureteroscopy.  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   Complication of anesthesia    Pt states having some "difficulty waking up"   Constipation 07/01/2018   Costochondritis 05/04/2014   Diverticulitis of colon 06/13/2007   S/p segmental colectomy for perforated diverticulitis    Diverticulosis    Facial pain 10/06/2015   Fibromyalgia    Foot sprain, left, initial encounter 08/31/2016   Glossitis 02/02/2016   Helicobacter pylori gastritis 10/2020   HEMATURIA UNSPECIFIED 06/25/2008   Qualifier: Diagnosis of  By: Tawanna Cooler MD, Tinnie Gens A    History of kidney stones    History of shingles 2012--  no residual pain   Hx of adenomatous colonic polyps 10/2020   Hypertension    Itching 10/13/2018   Left eye pain 07/01/2018   Left ureteral calculus    LOC OSTEOARTHROS NOT SPEC PRIM/SEC OTH [facility] SITE 06/27/2009   Qualifier: Diagnosis of  By: Tawanna Cooler MD, Tinnie Gens A    Malignant neoplasm of upper-outer quadrant of right breast in female, 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   MCI (mild cognitive impairment)    Memory loss 12/21/2016   Morbid obesity (HCC) 11/09/2010   Pain of right side of body 08/31/2016   Personal history of chemotherapy    Personal history of radiation therapy    Pruritus 10/26/2016   Renal disorder    hx of   Tremor 12/21/2016   Trigeminal neuralgia RIGHT   Trigeminal neuralgia of right side of face 11/30/2010   Trochanteric bursitis, right hip  10/26/2016   Urgency of urination    Weight loss 07/22/2017   Zoster without complications 09/27/2011   Past Surgical History:  Procedure Laterality Date   BREAST BIOPSY Right 11/11/2018   BREAST LUMPECTOMY Right 11/28/2018   BREAST LUMPECTOMY WITH RADIOACTIVE SEED AND SENTINEL LYMPH NODE BIOPSY Right 11/28/2018   Procedure: RIGHT BREAST LUMPECTOMY WITH RADIOACTIVE SEED AND RIGHT AXILLARY SENTINEL LYMPH NODE BIOPSY;  Surgeon: Glenna Fellows, MD;  Location: [facility] OR;  Service: General;  Laterality: Right;   CEREBRAL MICROVASCULAR DECOMPRESSION  07-05-2006   RIGHT TRIGEMINAL NERVE   COLONOSCOPY  2013   CG-MAC-moviprep (exc)-normal -mild tics   CYSTOSCOPY/RETROGRADE/URETEROSCOPY/STONE EXTRACTION WITH BASKET  07/04/2012   Procedure: CYSTOSCOPY/RETROGRADE/URETEROSCOPY/STONE EXTRACTION WITH BASKET;  Surgeon: Garnett Farm, MD;  Location: [facility];  Service: Urology;  Laterality: Left;   EXPLORATORY LAPAROTOMY/ RESECTION MID TO DISTAL SIGMOID AND PROXIMAL RECTUM/ END PROXIMAL SIGMOID COLOSTOMY  07-17-2006   PERFORATED DIVERTICULITIS WITH PERITONITIS   EXTRACORPOREAL SHOCK WAVE LITHOTRIPSY Left 01/19/2021   Procedure: LEFT EXTRACORPOREAL SHOCK WAVE LITHOTRIPSY (ESWL);  Surgeon: Alfredo Martinez, MD;  Location: [facility];  Service: Urology;  Laterality: Left;   EXTRACORPOREAL SHOCK WAVE LITHOTRIPSY Right 11/08/2023   Procedure: RIGHT EXTRACORPOREAL SHOCK WAVE LITHOTRIPSY (ESWL);  Surgeon: Rene Paci, MD;  Location: [facility];  Service: Urology;  Laterality: Right;  75 MINUTE CASE   gamma knife  05/17/2016   for trigeminal neuralgia, [facility], Dr Angelyn Punt   gamma knife  2019   IR CV LINE INJECTION  10/27/2019   PORTACATH PLACEMENT Left 11/28/2018   Procedure: INSERTION PORT-A-CATH WITH ULTRASOUND;  Surgeon: Glenna Fellows, MD;  Location: [facility] OR;  Service: General;  Laterality: Left;   portacath removal     RESECTION COLOSTOMY/ CLOSURE COLOSTOMY WITH  COLOPROCTOSTOMY  02-20-2007   TONSILLECTOMY  AS CHILD   TOTAL KNEE ARTHROPLASTY Left 06/20/2020   Procedure: LEFT TOTAL KNEE ARTHROPLASTY;  Surgeon: Tarry Kos, MD;  Location: [facility] OR;  Service: Orthopedics;  Laterality: Left;   URETEROSCOPY  07/04/2012   Procedure: URETEROSCOPY;  Surgeon: Garnett Farm, MD;  Location: [facility];  Service: Urology;  Laterality: Left;   VAGINAL HYSTERECTOMY  1998   Partial   WISDOM TOOTH EXTRACTION      Home Medications:  Medications Prior to Admission  Medication Sig Dispense Refill Last Dose/Taking   anastrozole (ARIMIDEX) 1 MG tablet TAKE 1 TABLET BY MOUTH EVERY DAY 90 tablet 3 12/15/2023 Bedtime   baclofen (LIORESAL) 10 MG tablet Take 1 tablet (10 mg total) by mouth 2 (two) times daily. 180 tablet 3 12/15/2023 Morning   diclofenac Sodium (VOLTAREN) 1 % GEL Apply 2 g topically 4 (four) times daily. (Patient taking differently: Apply 2 g topically 4 (four) times daily as needed (pain).) 150 g 0 Past Week   docusate sodium (COLACE) 100 MG capsule Take 100 mg by mouth daily as needed for mild constipation.   Past Week   DULoxetine (CYMBALTA) 60 MG capsule TAKE 1 CAPSULE BY MOUTH EVERY DAY 90 capsule 3 Past Week   gabapentin (NEURONTIN) 600 MG tablet TAKE 2 TABLETS BY MOUTH 3 TIMES A DAY 540 tablet 3 12/16/2023 at  3:30 AM   ketorolac (TORADOL) 10 MG tablet Take 10 mg by mouth every 6 (six) hours as needed.   Past Week   ondansetron (ZOFRAN-ODT) 8 MG disintegrating tablet Take 8 mg by mouth every 8 (eight) hours as needed.   Past Month   oxyCODONE-acetaminophen (PERCOCET/ROXICET) 5-325 MG tablet Take 1 tablet by mouth every 6 (six) hours as needed for severe pain (pain score 7-10). 15 tablet 0 Past Week   pravastatin (PRAVACHOL) 20 MG tablet Take 1 tablet (20 mg total) by mouth daily. 90 tablet 3 12/15/2023 Bedtime   tamsulosin (FLOMAX) 0.4 MG CAPS capsule Take 0.4 mg by mouth daily.   12/15/2023 Morning   venlafaxine XR (EFFEXOR-XR) 37.5 MG 24 hr  capsule Take 1 capsule (37.5 mg total) by mouth daily with breakfast. 90 capsule 1 12/16/2023 at  3:30 AM   Allergies:  Allergies  Allergen Reactions   Keflex [Cephalexin] Anaphylaxis and Other (See Comments)    Caused violent dizziness and syncope   Clindamycin Hcl Itching    Family History  Problem Relation Age of Onset   Heart disease Maternal Grandmother    Diabetes Mother    Breast cancer Maternal Aunt    Lupus Cousin    Colon cancer Neg Hx    Esophageal cancer Neg Hx    Rectal cancer Neg Hx    Stomach cancer Neg Hx    Colon polyps Neg Hx    Social History:  reports that she has never smoked. She has never been exposed to tobacco smoke. She has never used smokeless tobacco. She reports current alcohol use. She reports that she does not use drugs.  ROS: A complete review of systems was performed.  All systems are negative except for pertinent findings as noted. ROS   Physical Exam:  Vital signs in last 24 hours: Temp:  [98.6 F (37 C)] 98.6 F (37 C) (04/07 0540) Pulse Rate:  [88] 88 (04/07 0540) Resp:  [16] 16 (04/07 0540) BP: (154)/(76) 154/76 (04/07 0540) SpO2:  [96 %] 96 % (04/07 0540) Weight:  [104.3 kg] 104.3 kg (04/07 0557) General:  Alert and oriented, No acute distress HEENT: Normocephalic, atraumatic Neck: No JVD or lymphadenopathy Cardiovascular: Regular rate and rhythm Lungs: Regular rate and effort Abdomen: Soft, nontender, nondistended, no abdominal masses Back: No CVA tenderness Extremities: No edema Neurologic: Grossly intact  Laboratory Data:  No results found. However, due to the size of the patient record, not all encounters were searched. Please check Results Review for a complete set of results. No results found for this or any previous visit (from the past 240 hours). Creatinine: Recent Labs    12/13/23 1343  CREATININE 0.70    Impression/Assessment:  Right ureteral stone  Plan:  Proceed with right ureteroscopy.  Risk benefits  discussed  Ray Church, III 12/16/2023, 7:36 AM

## 2023-12-16 NOTE — Anesthesia Preprocedure Evaluation (Signed)
 Anesthesia Evaluation  Patient identified by MRN, date of birth, ID band Patient awake    Reviewed: Allergy & Precautions, NPO status , Patient's Chart, lab work & pertinent test results  Airway Mallampati: II  TM Distance: >3 FB Neck ROM: Full    Dental  (+) Dental Advisory Given   Pulmonary neg pulmonary ROS   breath sounds clear to auscultation       Cardiovascular hypertension, Pt. on medications  Rhythm:Regular Rate:Normal     Neuro/Psych  Neuromuscular disease    GI/Hepatic negative GI ROS, Neg liver ROS,,,  Endo/Other  negative endocrine ROS    Renal/GU Renal disease     Musculoskeletal  (+) Arthritis ,  Fibromyalgia -  Abdominal   Peds  Hematology  (+) Blood dyscrasia, anemia   Anesthesia Other Findings   Reproductive/Obstetrics                             Anesthesia Physical Anesthesia Plan  ASA: 2  Anesthesia Plan: General   Post-op Pain Management: Tylenol PO (pre-op)*   Induction: Intravenous  PONV Risk Score and Plan: 3 and Dexamethasone, Ondansetron, Treatment may vary due to age or medical condition and Midazolam  Airway Management Planned: LMA  Additional Equipment:   Intra-op Plan:   Post-operative Plan: Extubation in OR  Informed Consent: I have reviewed the patients History and Physical, chart, labs and discussed the procedure including the risks, benefits and alternatives for the proposed anesthesia with the patient or authorized representative who has indicated his/her understanding and acceptance.     Dental advisory given  Plan Discussed with:   Anesthesia Plan Comments:        Anesthesia Quick Evaluation

## 2023-12-17 ENCOUNTER — Encounter (HOSPITAL_COMMUNITY): Payer: Self-pay | Admitting: Urology

## 2023-12-19 NOTE — Anesthesia Postprocedure Evaluation (Signed)
 Anesthesia Post Note  Patient: Summer [name]  Procedure(s) Performed: CYSTOSCOPY/RETROGRADE/URETEROSCOPY (Right: Ureter)     Patient location during evaluation: PACU Anesthesia Type: General Level of consciousness: awake and alert Pain management: pain level controlled Vital Signs Assessment: post-procedure vital signs reviewed and stable Respiratory status: spontaneous breathing, nonlabored ventilation, respiratory function stable and patient connected to nasal cannula oxygen Cardiovascular status: blood pressure returned to baseline and stable Postop Assessment: no apparent nausea or vomiting Anesthetic complications: no   No notable events documented.  Last Vitals:  Vitals:   12/16/23 0915 12/16/23 0930  BP: (!) 142/57 135/65  Pulse: 64 65  Resp: 15 16  Temp:    SpO2: 98% 98%    Last Pain:  Vitals:   12/16/23 0930  TempSrc:   PainSc: 2                  Kennieth Rad

## 2024-01-01 ENCOUNTER — Encounter: Payer: Self-pay | Admitting: Internal Medicine

## 2024-01-02 NOTE — Progress Notes (Unsigned)
 [facility] History and Physical   Primary Care Physician:  Adelia Homestead, MD   Reason for Procedure:   Hx adenomatous colon polyps  Plan:    colonoscopy     HPI: Summer [name] is a 73 y.o. female here for surveillance colonoscopy - s/p removal 3 adenomas 2022. Also had sigmoid diverticulosis.   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   Complication of anesthesia    Pt states having some "difficulty waking up"   Constipation 07/01/2018   Costochondritis 05/04/2014   Diverticulitis of colon 06/13/2007   S/p segmental colectomy for perforated diverticulitis    Diverticulosis    Facial pain 10/06/2015   Fibromyalgia    Foot sprain, left, initial encounter 08/31/2016   Glossitis 02/02/2016   Helicobacter pylori gastritis 10/2020   HEMATURIA UNSPECIFIED 06/25/2008   Qualifier: Diagnosis of  By: Ena Harries MD, Susana Enter A    History of kidney stones    History of shingles 2012--  no residual pain   Hx of adenomatous colonic polyps 10/2020   Hypertension    Itching 10/13/2018   Left eye pain 07/01/2018   Left ureteral calculus    LOC OSTEOARTHROS NOT SPEC PRIM/SEC OTH [facility] SITE 06/27/2009   Qualifier: Diagnosis of  By: Ena Harries MD, Susana Enter A    Malignant neoplasm of upper-outer quadrant of right breast in female, 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   MCI (mild cognitive impairment)    Memory loss 12/21/2016   Morbid obesity (HCC) 11/09/2010   Pain of right side of body 08/31/2016   Personal history of chemotherapy    Personal history of radiation therapy    Pruritus 10/26/2016    Renal disorder    hx of   Tremor 12/21/2016   Trigeminal neuralgia RIGHT   Trigeminal neuralgia of right side of face 11/30/2010   Trochanteric bursitis, right hip 10/26/2016   Urgency of urination    Weight loss 07/22/2017   Zoster without complications 09/27/2011    Past Surgical History:  Procedure Laterality Date   BREAST BIOPSY Right 11/11/2018   BREAST LUMPECTOMY Right 11/28/2018   BREAST LUMPECTOMY WITH RADIOACTIVE SEED AND SENTINEL LYMPH NODE BIOPSY Right 11/28/2018   Procedure: RIGHT BREAST LUMPECTOMY WITH RADIOACTIVE SEED AND RIGHT AXILLARY SENTINEL LYMPH NODE BIOPSY;  Surgeon: Ayesha Lente, MD;  Location: [facility];  Service: General;  Laterality: Right;   CEREBRAL MICROVASCULAR DECOMPRESSION  07-05-2006   RIGHT TRIGEMINAL NERVE   COLONOSCOPY  2013   CG-MAC-moviprep  (exc)-normal -mild tics   CYSTOSCOPY/RETROGRADE/URETEROSCOPY Right 12/16/2023   Procedure: CYSTOSCOPY/RETROGRADE/URETEROSCOPY;  Surgeon: Samson Croak, MD;  Location: [facility];  Service: Urology;  Laterality: Right;  RIGHT RETROGRADE PYELOGRAM   CYSTOSCOPY/RETROGRADE/URETEROSCOPY/STONE EXTRACTION WITH BASKET  07/04/2012   Procedure: CYSTOSCOPY/RETROGRADE/URETEROSCOPY/STONE EXTRACTION WITH BASKET;  Surgeon: Mark C Ottelin, MD;  Location: [facility];  Service: Urology;  Laterality: Left;   EXPLORATORY LAPAROTOMY/ RESECTION MID TO DISTAL SIGMOID AND PROXIMAL RECTUM/ END PROXIMAL SIGMOID COLOSTOMY  07-17-2006   PERFORATED DIVERTICULITIS WITH PERITONITIS   EXTRACORPOREAL SHOCK WAVE LITHOTRIPSY Left 01/19/2021  Procedure: LEFT EXTRACORPOREAL SHOCK WAVE LITHOTRIPSY (ESWL);  Surgeon: Erman Hayward, MD;  Location: [facility];  Service: Urology;  Laterality: Left;   EXTRACORPOREAL SHOCK WAVE LITHOTRIPSY Right 11/08/2023   Procedure: RIGHT EXTRACORPOREAL SHOCK WAVE LITHOTRIPSY (ESWL);  Surgeon: Adelbert Homans, MD;  Location: [facility];  Service: Urology;  Laterality: Right;  77  MINUTE CASE   gamma knife  05/17/2016   for trigeminal neuralgia, [facility], Dr Melda Spore   gamma knife  2019   IR CV LINE INJECTION  10/27/2019   PORTACATH PLACEMENT Left 11/28/2018   Procedure: INSERTION PORT-A-CATH WITH ULTRASOUND;  Surgeon: Ayesha Lente, MD;  Location: [facility];  Service: General;  Laterality: Left;   portacath removal     RESECTION COLOSTOMY/ CLOSURE COLOSTOMY WITH COLOPROCTOSTOMY  02-20-2007   TONSILLECTOMY  AS CHILD   TOTAL KNEE ARTHROPLASTY Left 06/20/2020   Procedure: LEFT TOTAL KNEE ARTHROPLASTY;  Surgeon: Wes Hamman, MD;  Location: [facility];  Service: Orthopedics;  Laterality: Left;   URETEROSCOPY  07/04/2012   Procedure: URETEROSCOPY;  Surgeon: Mark C Ottelin, MD;  Location: [facility];  Service: Urology;  Laterality: Left;   VAGINAL HYSTERECTOMY  1998   Partial   WISDOM TOOTH EXTRACTION      Prior to Admission medications   Medication Sig Start Date End Date Taking? Authorizing Provider  anastrozole  (ARIMIDEX ) 1 MG tablet TAKE 1 TABLET BY MOUTH EVERY DAY 12/19/22   Burton, Lacie K, NP  baclofen  (LIORESAL ) 10 MG tablet Take 1 tablet (10 mg total) by mouth 2 (two) times daily. 06/26/23   Wess Hammed, NP  diclofenac  Sodium (VOLTAREN ) 1 % GEL Apply 2 g topically 4 (four) times daily. Patient taking differently: Apply 2 g topically 4 (four) times daily as needed (pain). 10/13/20   Sandie Cross, PA-C  docusate sodium  (COLACE) 100 MG capsule Take 100 mg by mouth daily as needed for mild constipation.    [provider]  DULoxetine  (CYMBALTA ) 60 MG capsule TAKE 1 CAPSULE BY MOUTH EVERY DAY 06/26/23   Wess Hammed, NP  gabapentin  (NEURONTIN ) 600 MG tablet TAKE 2 TABLETS BY MOUTH 3 TIMES A DAY 06/25/23   Wess Hammed, NP  ketorolac  (TORADOL ) 10 MG tablet Take 10 mg by mouth every 6 (six) hours as needed. 11/26/23   [provider]  ondansetron  (ZOFRAN -ODT) 8 MG disintegrating tablet Take 8 mg by mouth every 8 (eight) hours as  needed. 11/14/23   [provider]  oxyCODONE -acetaminophen  (PERCOCET/ROXICET) 5-325 MG tablet Take 1 tablet by mouth every 6 (six) hours as needed for severe pain (pain score 7-10). 11/03/23   Rosealee Concha, MD  pravastatin  (PRAVACHOL ) 20 MG tablet Take 1 tablet (20 mg total) by mouth daily. 05/14/23   Adelia Homestead, MD  tamsulosin  (FLOMAX ) 0.4 MG CAPS capsule Take 0.4 mg by mouth daily. 12/07/23   [provider]  venlafaxine  XR (EFFEXOR -XR) 37.5 MG 24 hr capsule Take 1 capsule (37.5 mg total) by mouth daily with breakfast. 05/30/23   Sonja Adams, MD    Current Outpatient Medications  Medication Sig Dispense Refill   anastrozole  (ARIMIDEX ) 1 MG tablet TAKE 1 TABLET BY MOUTH EVERY DAY 90 tablet 3   baclofen  (LIORESAL ) 10 MG tablet Take 1 tablet (10 mg total) by mouth 2 (two) times daily. 180 tablet 3   docusate sodium  (COLACE) 100 MG capsule Take 100 mg by mouth daily as needed for mild constipation.     DULoxetine  (CYMBALTA ) 60 MG  capsule TAKE 1 CAPSULE BY MOUTH EVERY DAY 90 capsule 3   gabapentin  (NEURONTIN ) 600 MG tablet TAKE 2 TABLETS BY MOUTH 3 TIMES A DAY 540 tablet 3   diclofenac  Sodium (VOLTAREN ) 1 % GEL Apply 2 g topically 4 (four) times daily. (Patient taking differently: Apply 2 g topically 4 (four) times daily as needed (pain).) 150 g 0   ketorolac  (TORADOL ) 10 MG tablet Take 10 mg by mouth every 6 (six) hours as needed.     ondansetron  (ZOFRAN -ODT) 8 MG disintegrating tablet Take 8 mg by mouth every 8 (eight) hours as needed.     oxyCODONE -acetaminophen  (PERCOCET/ROXICET) 5-325 MG tablet Take 1 tablet by mouth every 6 (six) hours as needed for severe pain (pain score 7-10). 15 tablet 0   pravastatin  (PRAVACHOL ) 20 MG tablet Take 1 tablet (20 mg total) by mouth daily. 90 tablet 3   tamsulosin  (FLOMAX ) 0.4 MG CAPS capsule Take 0.4 mg by mouth daily.     venlafaxine  XR (EFFEXOR -XR) 37.5 MG 24 hr capsule Take 1 capsule (37.5 mg total) by mouth daily with breakfast. 90  capsule 1   Current Facility-Administered Medications  Medication Dose Route Frequency Provider Last Rate Last Admin   0.9 %  sodium chloride  infusion  500 mL Intravenous Continuous Kenney Peacemaker, MD        Allergies as of 01/03/2024 - Review Complete 01/03/2024  Allergen Reaction Noted   Keflex  [cephalexin ] Anaphylaxis and Other (See Comments) 09/23/2020   Clindamycin hcl Itching 11/09/2010    Family History  Problem Relation Age of Onset   Heart disease Maternal Grandmother    Diabetes Mother    Breast cancer Maternal Aunt    Lupus Cousin    Colon cancer Neg Hx    Esophageal cancer Neg Hx    Rectal cancer Neg Hx    Stomach cancer Neg Hx    Colon polyps Neg Hx     Social History   Socioeconomic History   Marital status: Widowed    Spouse name: Not on file   Number of children: 1   Years of education: College   Highest education level: Not on file  Occupational History    Employer: UNEMPLOYED   Occupation: HUMAN RESOURCES     Employer: [employer]  Tobacco Use   Smoking status: Never    Passive exposure: Never   Smokeless tobacco: Never  Vaping Use   Vaping status: Never Used  Substance and Sexual Activity   Alcohol use: Yes    Comment: rarely   Drug use: No   Sexual activity: Not on file  Other Topics Concern   Not on file  Social History Narrative   Not on file   Social Drivers of Health   Financial Resource Strain: Not on file  Food Insecurity: Not on file  Transportation Needs: Not on file  Physical Activity: Not on file  Stress: Not on file  Social Connections: Unknown (02/13/2023)   Received from [facility], [facility]   Social Network    Social Network: Not on file  Intimate Partner Violence: Unknown (02/13/2023)   Received from [facility], [facility]   HITS    Physically Hurt: Not on file    Insult or Talk Down To: Not on file    Threaten Physical Harm: Not on file    Scream or Curse: Not on file    Review of  Systems:  All other review of systems negative except as mentioned in the HPI.  Physical Exam: Vital signs BP (!) 164/112   Pulse 82   Temp 97.7 F (36.5 C)   Resp 13   Ht 5\' 6"  (1.676 m)   Wt 230 lb (104.3 kg)   SpO2 100%   BMI 37.12 kg/m   General:   Alert,  Well-developed, well-nourished, pleasant and cooperative in NAD Lungs:  Clear throughout to auscultation.   Heart:  Regular rate and rhythm; no murmurs, clicks, rubs,  or gallops. Abdomen:  Soft, nontender and nondistended. Normal bowel sounds.   Neuro/Psych:  Alert and cooperative. Normal mood and affect. A and O x 3   @Dorothye Berni  Tammie Fall, MD, [facility] [phone] (pager) 01/03/2024 8:59 AM@

## 2024-01-03 ENCOUNTER — Encounter: Payer: Self-pay | Admitting: Internal Medicine

## 2024-01-03 ENCOUNTER — Ambulatory Visit (AMBULATORY_SURGERY_CENTER): Payer: Federal, State, Local not specified - PPO | Admitting: Internal Medicine

## 2024-01-03 VITALS — BP 123/69 | HR 75 | Temp 97.7°F | Resp 10 | Ht 66.0 in | Wt 230.0 lb

## 2024-01-03 DIAGNOSIS — K5730 Diverticulosis of large intestine without perforation or abscess without bleeding: Secondary | ICD-10-CM

## 2024-01-03 DIAGNOSIS — Z860100 Personal history of colon polyps, unspecified: Secondary | ICD-10-CM

## 2024-01-03 DIAGNOSIS — D123 Benign neoplasm of transverse colon: Secondary | ICD-10-CM

## 2024-01-03 DIAGNOSIS — Z860101 Personal history of adenomatous and serrated colon polyps: Secondary | ICD-10-CM | POA: Diagnosis not present

## 2024-01-03 DIAGNOSIS — Z1211 Encounter for screening for malignant neoplasm of colon: Secondary | ICD-10-CM

## 2024-01-03 MED ORDER — SODIUM CHLORIDE 0.9 % IV SOLN: 500.0000 mL | INTRAVENOUS | Status: DC

## 2024-01-03 NOTE — Op Note (Addendum)
 [facility] Patient Name: Summer [name] Procedure Date: 01/03/2024 8:54 AM MRN: [id number] Endoscopist: Kenney Peacemaker , MD, [id number] Age: 73 Referring MD:  Date of Birth: 1950/12/06 Gender: Female Account #: [id number] Procedure:                Colonoscopy Indications:              Surveillance: Personal history of adenomatous                            polyps on last colonoscopy 3 years ago, Last                            colonoscopy: February 2022 Medicines:                Monitored Anesthesia Care Procedure:                Pre-Anesthesia Assessment:                           - Prior to the procedure, a History and Physical                            was performed, and patient medications and                            allergies were reviewed. The patient's tolerance of                            previous anesthesia was also reviewed. The risks                            and benefits of the procedure and the sedation                            options and risks were discussed with the patient.                            All questions were answered, and informed consent                            was obtained. Prior Anticoagulants: The patient has                            taken no anticoagulant or antiplatelet agents. ASA                            Grade Assessment: III - A patient with severe                            systemic disease. After reviewing the risks and                            benefits, the patient was deemed in satisfactory  condition to undergo the procedure.                           After obtaining informed consent, the colonoscope                            was passed under direct vision. Throughout the                            procedure, the patient's blood pressure, pulse, and                            oxygen saturations were monitored continuously. The                            CF [id number] #[id number] was introduced  through the anus                            and advanced to the the cecum, identified by                            appendiceal orifice and ileocecal valve. The                            colonoscopy was performed without difficulty. The                            patient tolerated the procedure well. The quality                            of the bowel preparation was excellent. The                            ileocecal valve, appendiceal orifice, and rectum                            were photographed. The bowel preparation used was                            SUPREP via extended prep with split dose                            instruction. Scope In: 9:02:54 AM Scope Out: 9:16:00 AM Scope Withdrawal Time: 0 hours 11 minutes 30 seconds  Total Procedure Duration: 0 hours 13 minutes 6 seconds  Findings:                 The perianal and digital rectal examinations were                            normal.                           Two sessile polyps were found in the transverse  colon. The polyps were 1 to 4 mm in size. These                            polyps were removed with a cold snare. Resection                            and retrieval were complete. Verification of                            patient identification for the specimen was done.                            Estimated blood loss was minimal.                           Multiple diverticula were found in the sigmoid                            colon, transverse colon and ascending colon.                           The exam was otherwise without abnormality on                            direct and retroflexion views. Complications:            No immediate complications. Estimated Blood Loss:     Estimated blood loss was minimal. Impression:               - Two 1 to 4 mm polyps in the transverse colon,                            removed with a cold snare. Resected and retrieved.                           -  Diverticulosis in the sigmoid colon, in the                            transverse colon and in the ascending colon.                           - The examination was otherwise normal on direct                            and retroflexion views.                           - Personal history of colonic polyps. 3 diminutive                            adenomas 2022 Recommendation:           - Patient has a contact number available for  emergencies. The signs and symptoms of potential                            delayed complications were discussed with the                            patient. Return to normal activities tomorrow.                            Written discharge instructions were provided to the                            patient.                           - Resume previous diet.                           - Continue present medications.                           - Repeat colonoscopy may be recommended. The                            colonoscopy date will be determined after pathology                            results from today's exam become available for                            review. Kenney Peacemaker, MD 01/03/2024 9:21:08 AM This report has been signed electronically.

## 2024-01-03 NOTE — Progress Notes (Signed)
 Vss nad trans to pacu

## 2024-01-03 NOTE — Progress Notes (Signed)
 Called to room to assist during endoscopic procedure.  Patient ID and intended procedure confirmed with present staff. Received instructions for my participation in the procedure from the performing physician.

## 2024-01-03 NOTE — Patient Instructions (Addendum)
 Two tiny polyps removed today. Look benign again.  You also have a condition called diverticulosis - common and not usually a problem. Please read the handout provided.   I will let you know pathology results and when to have another routine colonoscopy by mail and/or My Chart.  I appreciate the opportunity to care for you. Kenney Peacemaker, MD, [facility]  **Handouts given on polyps and diverticulosis**  YOU HAD AN ENDOSCOPIC PROCEDURE TODAY AT THE [facility]:   Refer to the procedure report that was given to you for any specific questions about what was found during the examination.  If the procedure report does not answer your questions, please call your gastroenterologist to clarify.  If you requested that your care partner not be given the details of your procedure findings, then the procedure report has been included in a sealed envelope for you to review at your convenience later.  YOU SHOULD EXPECT: Some feelings of bloating in the abdomen. Passage of more gas than usual.  Walking can help get rid of the air that was put into your GI tract during the procedure and reduce the bloating. If you had a lower endoscopy (such as a colonoscopy or flexible sigmoidoscopy) you may notice spotting of blood in your stool or on the toilet paper. If you underwent a bowel prep for your procedure, you may not have a normal bowel movement for a few days.  Please Note:  You might notice some irritation and congestion in your nose or some drainage.  This is from the oxygen used during your procedure.  There is no need for concern and it should clear up in a day or so.  SYMPTOMS TO REPORT IMMEDIATELY:  Following lower endoscopy (colonoscopy or flexible sigmoidoscopy):  Excessive amounts of blood in the stool  Significant tenderness or worsening of abdominal pains  Swelling of the abdomen that is new, acute  Fever of 100F or higher  For urgent or emergent issues, a gastroenterologist can be  reached at any hour by calling [phone]. Do not use MyChart messaging for urgent concerns.    DIET:  We do recommend a small meal at first, but then you may proceed to your regular diet.  Drink plenty of fluids but you should avoid alcoholic beverages for 24 hours.  ACTIVITY:  You should plan to take it easy for the rest of today and you should NOT DRIVE or use heavy machinery until tomorrow (because of the sedation medicines used during the test).    FOLLOW UP: Our staff will call the number listed on your records the next business day following your procedure.  We will call around 7:15- 8:00 am to check on you and address any questions or concerns that you may have regarding the information given to you following your procedure. If we do not reach you, we will leave a message.     If any biopsies were taken you will be contacted by phone or by letter within the next 1-3 weeks.  Please call us  at [phone] if you have not heard about the biopsies in 3 weeks.    SIGNATURES/CONFIDENTIALITY: You and/or your care partner have signed paperwork which will be entered into your electronic medical record.  These signatures attest to the fact that that the information above on your After Visit Summary has been reviewed and is understood.  Full responsibility of the confidentiality of this discharge information lies with you and/or your care-partner.

## 2024-01-06 ENCOUNTER — Telehealth: Payer: Self-pay | Admitting: *Deleted

## 2024-01-06 NOTE — Telephone Encounter (Signed)
  Follow up Call-     01/03/2024    8:13 AM  Call back number  Post procedure Call Back phone  # [phone]  Permission to leave phone message Yes     Patient questions:  Do you have a fever, pain , or abdominal swelling? No. Pain Score  0 *  Have you tolerated food without any problems? Yes.    Have you been able to return to your normal activities? Yes.    Do you have any questions about your discharge instructions: Diet   No. Medications  No. Follow up visit  No.  Do you have questions or concerns about your Care? No.  Actions: * If pain score is 4 or above: No action needed, pain <4.

## 2024-01-07 ENCOUNTER — Encounter: Payer: Self-pay | Admitting: Internal Medicine

## 2024-01-07 LAB — SURGICAL PATHOLOGY

## 2024-02-12 ENCOUNTER — Other Ambulatory Visit (INDEPENDENT_AMBULATORY_CARE_PROVIDER_SITE_OTHER): Payer: Self-pay

## 2024-02-12 ENCOUNTER — Encounter: Payer: Self-pay | Admitting: Hematology

## 2024-02-12 ENCOUNTER — Ambulatory Visit: Admitting: Orthopaedic Surgery

## 2024-02-12 DIAGNOSIS — G8929 Other chronic pain: Secondary | ICD-10-CM

## 2024-02-12 DIAGNOSIS — M25562 Pain in left knee: Secondary | ICD-10-CM | POA: Diagnosis not present

## 2024-02-12 NOTE — Progress Notes (Signed)
 Office Visit Note   Patient: Summer [name]           Date of Birth: 10/06/1950           MRN: [id number] Visit Date: 02/12/2024              Requested by: Adelia Homestead, MD [address] [place],  [place] [address] PCP: Adelia Homestead, MD   Assessment & Plan: Visit Diagnoses:  1. Chronic pain of left knee     Plan: History of Present Illness Summer [name] is a 73 year old female who presents with left knee pain and clicking noise.  She experiences severe knee pain that sometimes wakes her at night. The pain is widespread without a specific point of origin and is sometimes accompanied by a clicking noise during movement. The knee swells periodically, often at night or when she is sitting and then tries to stand. Recent weight gain is noted.  Physical Exam MUSCULOSKELETAL: Knee scar healed with some keloiding. Full knee extension. Knee flexion 110-115 degrees. Collateral ligaments intact. No significant knee swelling.  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 X-rays show well-positioned prosthesis. Clicking noise normal due to metal-plastic contact. Nocturnal pain suggests muscle fatigue. - Refer to physical therapy for muscle strengthening exercises. - Advise weight reduction to alleviate knee pressure.  Skin irritation with keloid formation Dry, burning, itchy skin around knee due to keloid formation. - Recommend applying moisturizer or cocoa butter to alleviate dryness and irritation.  Follow-Up Instructions: Return if symptoms worsen or fail to improve.   Orders:  Orders Placed This Encounter  Procedures   XR KNEE 3 VIEW LEFT   Ambulatory referral to Physical Therapy   No orders of the defined types were placed in this  encounter.  Subjective: Chief Complaint  Patient presents with   Left Knee - Pain    HPI  Review of Systems  Constitutional: Negative.   HENT: Negative.    Eyes: Negative.   Respiratory: Negative.    Cardiovascular: Negative.   Endocrine: Negative.   Musculoskeletal: Negative.   Neurological: Negative.   Hematological: Negative.   Psychiatric/Behavioral: Negative.    All other systems reviewed and are negative.    Objective: Vital Signs: There were no vitals taken for this visit.  Physical Exam Vitals and nursing note reviewed.  Constitutional:      Appearance: She is well-developed.  HENT:     Head: Atraumatic.     Nose: Nose normal.  Eyes:     Extraocular Movements: Extraocular movements intact.  Cardiovascular:     Pulses: Normal pulses.  Pulmonary:     Effort: Pulmonary effort is normal.  Abdominal:     Palpations: Abdomen is soft.  Musculoskeletal:     Cervical back: Neck supple.  Skin:    General: Skin is warm.     Capillary Refill: Capillary refill takes less than 2 seconds.  Neurological:     Mental Status: She is alert. Mental status is at baseline.  Psychiatric:        Behavior: Behavior normal.        Thought Content: Thought content normal.        Judgment: Judgment normal.     Imaging: XR KNEE 3 VIEW LEFT Result Date: 02/12/2024 Stable left total knee replacement without complication    PMFS  History: Patient Active Problem List   Diagnosis Date Noted   Aortic atherosclerosis (HCC) 11/26/2023   Chemotherapy-induced neuropathy (HCC) 05/17/2023   Double vision 08/30/2022   Breast asymmetry 08/30/2022   Osteopenia 08/17/2022   Hyperglycemia 04/13/2022   HLD (hyperlipidemia) 04/13/2022   Pain in right shoulder 10/11/2021   Kidney stones 11/18/2020   Chronic left-sided low back pain with left-sided sciatica 08/10/2020   Primary osteoarthritis of left knee 06/19/2020   Sensorineural hearing loss (SNHL) of both ears 06/06/2020    Bilateral arm numbness and tingling while sleeping 02/26/2020   Muscle cramps 05/12/2019   Atypical chest pain 02/10/2019   Port-A-Cath in place 01/30/2019   Anemia, iron deficiency 11/21/2018   Malignant neoplasm of upper-outer quadrant of right breast in female, 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   Constipation 07/01/2018   Memory loss 12/21/2016   Tremor 12/21/2016   Routine general medical examination at a health care facility 10/27/2015   Trigeminal neuralgia of right side of face 11/30/2010   Fibromyalgia 11/09/2010   History of urinary system disease 11/14/2009   Allergic rhinitis 08/26/2008   VULVA INTRAEPITHELIAL NEOPLASIA, 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   Complication of anesthesia    Pt states having some "difficulty waking up"   Constipation 07/01/2018   Costochondritis 05/04/2014   Diverticulitis of colon 06/13/2007   S/p segmental colectomy for perforated diverticulitis    Diverticulosis    Facial pain 10/06/2015   Fibromyalgia    Foot sprain, left, initial encounter 08/31/2016   Glossitis 02/02/2016   Helicobacter pylori gastritis 10/2020   HEMATURIA UNSPECIFIED 06/25/2008   Qualifier: Diagnosis of  By: Ena Harries MD, Susana Enter A    History of kidney stones    History of shingles 2012--  no residual pain   Hx of adenomatous colonic polyps 10/2020   Hypertension    Itching 10/13/2018   Left eye pain 07/01/2018   Left ureteral calculus    LOC OSTEOARTHROS NOT SPEC PRIM/SEC OTH [facility] SITE 06/27/2009   Qualifier: Diagnosis of  By: Ena Harries MD, Susana Enter A    Malignant  neoplasm of upper-outer quadrant of right breast in female, 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   MCI (mild cognitive impairment)    Memory loss 12/21/2016   Morbid obesity (HCC) 11/09/2010   Pain of right side of body 08/31/2016   Personal history of chemotherapy    Personal history of radiation therapy    Pruritus 10/26/2016   Renal disorder    hx of   Tremor 12/21/2016   Trigeminal neuralgia RIGHT   Trigeminal neuralgia of right side of face 11/30/2010   Trochanteric bursitis, right hip 10/26/2016   Urgency of urination    Weight loss 07/22/2017   Zoster without complications 09/27/2011    Family History  Problem Relation Age of Onset   Heart disease Maternal Grandmother    Diabetes Mother    Breast cancer Maternal Aunt    Lupus Cousin    Colon cancer Neg Hx    Esophageal cancer Neg Hx  Rectal cancer Neg Hx    Stomach cancer Neg Hx    Colon polyps Neg Hx     Past Surgical History:  Procedure Laterality Date   BREAST BIOPSY Right 11/11/2018   BREAST LUMPECTOMY Right 11/28/2018   BREAST LUMPECTOMY WITH RADIOACTIVE SEED AND SENTINEL LYMPH NODE BIOPSY Right 11/28/2018   Procedure: RIGHT BREAST LUMPECTOMY WITH RADIOACTIVE SEED AND RIGHT AXILLARY SENTINEL LYMPH NODE BIOPSY;  Surgeon: Ayesha Lente, MD;  Location: [facility];  Service: General;  Laterality: Right;   CEREBRAL MICROVASCULAR DECOMPRESSION  07-05-2006   RIGHT TRIGEMINAL NERVE   COLONOSCOPY  2013   CG-MAC-moviprep  (exc)-normal -mild tics   CYSTOSCOPY/RETROGRADE/URETEROSCOPY Right 12/16/2023   Procedure: CYSTOSCOPY/RETROGRADE/URETEROSCOPY;  Surgeon: Samson Croak, MD;  Location: [facility];  Service: Urology;  Laterality: Right;  RIGHT RETROGRADE PYELOGRAM   CYSTOSCOPY/RETROGRADE/URETEROSCOPY/STONE EXTRACTION WITH BASKET  07/04/2012   Procedure: CYSTOSCOPY/RETROGRADE/URETEROSCOPY/STONE EXTRACTION WITH BASKET;  Surgeon: Mark C Ottelin, MD;  Location: [facility];  Service: Urology;  Laterality:  Left;   EXPLORATORY LAPAROTOMY/ RESECTION MID TO DISTAL SIGMOID AND PROXIMAL RECTUM/ END PROXIMAL SIGMOID COLOSTOMY  07-17-2006   PERFORATED DIVERTICULITIS WITH PERITONITIS   EXTRACORPOREAL SHOCK WAVE LITHOTRIPSY Left 01/19/2021   Procedure: LEFT EXTRACORPOREAL SHOCK WAVE LITHOTRIPSY (ESWL);  Surgeon: Erman Hayward, MD;  Location[facility];  Service: Urology;  Laterality: Left;   EXTRACORPOREAL SHOCK WAVE LITHOTRIPSY Right 11/08/2023   Procedure: RIGHT EXTRACORPOREAL SHOCK WAVE LITHOTRIPSY (ESWL);  Surgeon: Adelbert Homans, MD;  Location: [facility];  Service: Urology;  Laterality: Right;  75 MINUTE CASE   gamma knife  05/17/2016   for trigeminal neuralgia, [facility], Dr Melda Spore   gamma knife  2019   IR CV LINE INJECTION  10/27/2019   PORTACATH PLACEMENT Left 11/28/2018   Procedure: INSERTION PORT-A-CATH WITH ULTRASOUND;  Surgeon: Ayesha Lente, MD;  Location: [facility];  Service: General;  Laterality: Left;   portacath removal     RESECTION COLOSTOMY/ CLOSURE COLOSTOMY WITH COLOPROCTOSTOMY  02-20-2007   TONSILLECTOMY  AS CHILD   TOTAL KNEE ARTHROPLASTY Left 06/20/2020   Procedure: LEFT TOTAL KNEE ARTHROPLASTY;  Surgeon: Wes Hamman, MD;  Location: [facility];  Service: Orthopedics;  Laterality: Left;   URETEROSCOPY  07/04/2012   Procedure: URETEROSCOPY;  Surgeon: Mark C Ottelin, MD;  Location: [facility];  Service: Urology;  Laterality: Left;   VAGINAL HYSTERECTOMY  1998   Partial   WISDOM TOOTH EXTRACTION     Social History   Occupational History    Employer: UNEMPLOYED   Occupation: HUMAN RESOURCES     Employer: [employer]  Tobacco Use   Smoking status: Never    Passive exposure: Never   Smokeless tobacco: Never  Vaping Use   Vaping status: Never Used  Substance and Sexual Activity   Alcohol use: Yes    Comment: rarely   Drug use: No   Sexual activity: Not on file

## 2024-02-20 ENCOUNTER — Ambulatory Visit: Payer: Self-pay

## 2024-02-20 NOTE — Telephone Encounter (Signed)
 FYI Only or Action Required?: FYI only for provider  Patient was last seen in primary care on 11/26/2023 by Adelia Homestead, MD. Called Nurse Triage reporting Back Pain and Foot Swelling. Symptoms began back pain several months ago, lower leg swelling x 2 days. Interventions attempted: OTC medications: Tylenol . Symptoms are: bilateral lower extremity swelling, lower back pain worse on the right and radiates to hips and buttocks gradually worsening.  Triage Disposition: See PCP When Office is Open (Within 3 Days)  Patient/caregiver understands and will follow disposition?: Yes                       Copied from CRM [id number]. Topic: Clinical - Red Word Triage >> Feb 20, 2024 11:32 AM Artemio Larry wrote: Red Word that prompted transfer to Nurse Triage: Patient having back pain, and both feet/ankles are swollen Reason for Disposition  [1] MODERATE back pain (e.g., interferes with normal activities) AND [2] present > 3 days  Answer Assessment - Initial Assessment Questions 1. ONSET: When did the pain begin?      Few months.   2. LOCATION: Where does it hurt? (upper, mid or lower back)     Lower back mainly right side.  3. SEVERITY: How bad is the pain?  (e.g., Scale 1-10; mild, moderate, or severe)   - MILD (1-3): Doesn't interfere with normal activities.    - MODERATE (4-7): Interferes with normal activities or awakens from sleep.    - SEVERE (8-10): Excruciating pain, unable to do any normal activities.      Sometimes severe, today it is 6-7/10.  4. PATTERN: Is the pain constant? (e.g., yes, no; constant, intermittent)      Comes and goes.  5. RADIATION: Does the pain shoot into your legs or somewhere else?     Shoots into buttocks on both sides, denies radiating down legs.  6. CAUSE:  What do you think is causing the back pain?      Unsure.  7. BACK OVERUSE:  Any recent lifting of heavy objects, strenuous work or exercise?     No.  8. MEDICINES:  What have you taken so far for the pain? (e.g., nothing, acetaminophen , NSAIDS)     Tylenol .  9. NEUROLOGIC SYMPTOMS: Do you have any weakness, numbness, or problems with bowel/bladder control?     No.  10. OTHER SYMPTOMS: Do you have any other symptoms? (e.g., fever, abdomen pain, burning with urination, blood in urine)       Bilateral feet and ankle swelling up to calves x 2 days (difficulty putting shoes on).  11. PREGNANCY: Is there any chance you are pregnant? When was your last menstrual period?       N/A.  Patient denies any redness in calves, chest pain, SOB.  Protocols used: Back Pain-A-AH

## 2024-02-21 ENCOUNTER — Encounter: Payer: Self-pay | Admitting: Family Medicine

## 2024-02-21 ENCOUNTER — Ambulatory Visit: Admitting: Family Medicine

## 2024-02-21 VITALS — BP 138/70 | HR 66 | Temp 98.5°F | Ht 66.0 in | Wt 237.2 lb

## 2024-02-21 DIAGNOSIS — M7989 Other specified soft tissue disorders: Secondary | ICD-10-CM | POA: Insufficient documentation

## 2024-02-21 DIAGNOSIS — M5441 Lumbago with sciatica, right side: Secondary | ICD-10-CM

## 2024-02-21 DIAGNOSIS — G8929 Other chronic pain: Secondary | ICD-10-CM | POA: Diagnosis not present

## 2024-02-21 MED ORDER — KETOROLAC TROMETHAMINE 10 MG PO TABS: 10.0000 mg | ORAL_TABLET | Freq: Four times a day (QID) | ORAL | 0 refills | Status: DC | PRN

## 2024-02-21 MED ORDER — BACLOFEN 10 MG PO TABS: 10.0000 mg | ORAL_TABLET | Freq: Two times a day (BID) | ORAL | 3 refills | Status: AC

## 2024-02-21 MED ORDER — ZEPBOUND 2.5 MG/0.5ML ~~LOC~~ SOAJ: 2.5000 mg | SUBCUTANEOUS | 0 refills | Status: DC

## 2024-02-21 MED ORDER — FUROSEMIDE 20 MG PO TABS: 20.0000 mg | ORAL_TABLET | Freq: Every day | ORAL | 3 refills | Status: DC

## 2024-02-21 NOTE — Progress Notes (Unsigned)
   Acute Office Visit  Subjective:     Patient ID: Summer [name], female    DOB: [birth year], 73 y.o.   MRN: [id number]  Chief Complaint  Patient presents with   Acute Visit    Bilateral feet swelling, ongoing for about a week. Back pain is chronic, ongoing for months    HPI Patient is in today for lower extremity swelling for the last 2 to 3 days. No shortness of breath.  Reports chronic low back pain  ROS Per HPI      Objective:    BP 138/70 (BP Location: Left Arm, Patient Position: Sitting)   Pulse 66   Temp 98.5 F (36.9 C) (Temporal)   Ht 5' 6 (1.676 m)   Wt 237 lb 3.2 oz (107.6 kg)   SpO2 96%   BMI 38.29 kg/m    Physical Exam  No results found for any visits on 02/21/24.      Assessment & Plan:   There are no diagnoses linked to this encounter.   No orders of the defined types were placed in this encounter.   No follow-ups on file.  Wellington Half, FNP

## 2024-02-21 NOTE — Patient Instructions (Addendum)
 Sacral cushion on [employer].  Compression socks.  We are checking labs today, will be in contact with any results that require further attention.  I will send in lasix 20mg  for you to take once a day as needed for swelling.  Follow-up with me for new or worsening symptoms.

## 2024-02-22 LAB — CBC WITH DIFFERENTIAL/PLATELET
Absolute Lymphocytes: 2431 {cells}/uL (ref 850–3900)
Absolute Monocytes: 475 {cells}/uL (ref 200–950)
Basophils Absolute: 52 {cells}/uL (ref 0–200)
Basophils Relative: 0.8 %
Eosinophils Absolute: 202 {cells}/uL (ref 15–500)
Eosinophils Relative: 3.1 %
HCT: 40.6 % (ref 35.0–45.0)
Hemoglobin: 13.2 g/dL (ref 11.7–15.5)
MCH: 28.9 pg (ref 27.0–33.0)
MCHC: 32.5 g/dL (ref 32.0–36.0)
MCV: 88.8 fL (ref 80.0–100.0)
MPV: 10 fL (ref 7.5–12.5)
Monocytes Relative: 7.3 %
Neutro Abs: 3341 {cells}/uL (ref 1500–7800)
Neutrophils Relative %: 51.4 %
Platelets: 270 10*3/uL (ref 140–400)
RBC: 4.57 10*6/uL (ref 3.80–5.10)
RDW: 12.2 % (ref 11.0–15.0)
Total Lymphocyte: 37.4 %
WBC: 6.5 10*3/uL (ref 3.8–10.8)

## 2024-02-22 LAB — COMPREHENSIVE METABOLIC PANEL WITH GFR
AG Ratio: 1.5 (calc) (ref 1.0–2.5)
ALT: 7 U/L (ref 6–29)
AST: 15 U/L (ref 10–35)
Albumin: 4 g/dL (ref 3.6–5.1)
Alkaline phosphatase (APISO): 115 U/L (ref 37–153)
BUN: 9 mg/dL (ref 7–25)
CO2: 27 mmol/L (ref 20–32)
Calcium: 9 mg/dL (ref 8.6–10.4)
Chloride: 104 mmol/L (ref 98–110)
Creat: 0.68 mg/dL (ref 0.60–1.00)
Globulin: 2.6 g/dL (ref 1.9–3.7)
Glucose, Bld: 84 mg/dL (ref 65–99)
Potassium: 3.9 mmol/L (ref 3.5–5.3)
Sodium: 142 mmol/L (ref 135–146)
Total Bilirubin: 0.6 mg/dL (ref 0.2–1.2)
Total Protein: 6.6 g/dL (ref 6.1–8.1)
eGFR: 94 mL/min/{1.73_m2} (ref >=60)

## 2024-02-22 LAB — BRAIN NATRIURETIC PEPTIDE: Brain Natriuretic Peptide: 20 pg/mL (ref <100)

## 2024-02-22 NOTE — Assessment & Plan Note (Signed)
 CBC, CMP, BNP Trial of Lasix 20 mg once daily as needed swelling Discussed use of compression socks Discussed to increase fluids without caffeine

## 2024-02-22 NOTE — Assessment & Plan Note (Signed)
 Zepbound 2.5 mg to pharmacy

## 2024-02-22 NOTE — Assessment & Plan Note (Addendum)
 Refilled Toradol  and baclofen  today Discussed to limit the use of Toradol  to about 2 weeks. May take Tylenol  with this medication if needed. Discussed and declined physical therapy today Discussed the use of a sacral cushion while working or sitting for long periods of time

## 2024-02-25 ENCOUNTER — Encounter: Payer: Self-pay | Admitting: Hematology

## 2024-02-25 ENCOUNTER — Telehealth: Payer: Self-pay

## 2024-02-25 ENCOUNTER — Other Ambulatory Visit (HOSPITAL_COMMUNITY): Payer: Self-pay

## 2024-02-25 NOTE — Telephone Encounter (Signed)
 Pharmacy Patient Advocate Encounter   Received notification from CoverMyMeds that prior authorization for Zepbound 2.5 is required/requested.   Insurance verification completed.   The patient is insured through [facility] .   Per test claim: PA required; PA submitted to above mentioned insurance via CoverMyMeds Key/confirmation #/EOC [facility] Status is pending

## 2024-02-26 ENCOUNTER — Encounter: Payer: Self-pay | Admitting: Hematology

## 2024-02-26 ENCOUNTER — Other Ambulatory Visit (HOSPITAL_COMMUNITY): Payer: Self-pay

## 2024-02-28 ENCOUNTER — Ambulatory Visit: Payer: Self-pay | Admitting: Family

## 2024-02-28 NOTE — Telephone Encounter (Signed)
 Pharmacy Patient Advocate Encounter  Received notification from [employer] that Prior Authorization for Zepbound 2.5 has been DENIED.  Full denial letter will be uploaded to the media tab. See denial reason below.   PA #/Case ID/Reference #: Crystal Dory

## 2024-03-15 ENCOUNTER — Other Ambulatory Visit: Payer: Self-pay | Admitting: Nurse Practitioner

## 2024-04-02 ENCOUNTER — Other Ambulatory Visit: Payer: Self-pay

## 2024-04-02 DIAGNOSIS — Z170 Estrogen receptor positive status [ER+]: Secondary | ICD-10-CM

## 2024-04-02 NOTE — Progress Notes (Unsigned)
 Patient Care Team: Rollene Almarie LABOR, MD as PCP - General (Internal Medicine) Shlomo Wilbert SAUNDERS, MD as PCP - Cardiology (Cardiology) Glean Stephane BROCKS, RN (Inactive) as Oncology Nurse Navigator Tyree Nanetta SAILOR, RN as Oncology Nurse Navigator Mikell Katz, MD (Inactive) as Consulting Physician (General Surgery) Lanny Callander, MD as Consulting Physician (Hematology) Shannon Agent, MD as Consulting Physician (Radiation Oncology) Burton, Lacie K, NP as Nurse Practitioner (Nurse Practitioner)  Clinic Day:  04/02/2024  Referring physician: Rollene Almarie LABOR, *  ASSESSMENT & PLAN:   Assessment & Plan: Malignant neoplasm of upper-outer quadrant of right breast in female, estrogen receptor positive (HCC) Stage IA, pT1b,N0,M0, ER/PR+, HER2+, Grade II   --She was diagnosed in 11/2018. She is s/p right breast lumpectomy and SLNB  and adjuvant radiation. She was treated with adjuvant chemo (poorly tolerated Taxol  and Herceptin , d/c after 6 weeks), Kadcyla  for 1 month, then Herceptin  only until 12/2019.  -Antiestrogen therapy was started with Exemestane  in 06/2019. She has tolerated moderately well. Due to known joint pain worsening in shoulders and knees, exemestane  was changed to anastrozole  in June 2023, she is tolerating well.  Will continue.  -She is clinically doing well, lab reviewed, exam was unremarkable, no clinical concern for recurrence. -She is not happy with the asymmetry of her breasts, would like to do breast reductions.  She has seen 2 plastic surgeons, not happy with the recommendations.  -Most recent mammogram done 02/21/2023 through [facility].  Benign results.  Recall expected in 1 year.  She will also be due for bone density test at that time.    The patient understands the plans discussed today and is in agreement with them.  She knows to contact our office if she develops concerns prior to her next appointment.  I provided *** minutes of face-to-face time during this  encounter and > 50% was spent counseling as documented under my assessment and plan.    Powell FORBES Lessen, NP  [facility] - A DEPT OF [facility] [address] [place] [address] Dept: [phone] Dept Fax: [phone]   No orders of the defined types were placed in this encounter.     CHIEF COMPLAINT:  CC: right breast cancer, estrogen receptor positive   Current Treatment: Adjuvant anastrozole   INTERVAL HISTORY:  Summer [name] is here today for repeat clinical assessment.  She last saw me on 10/04/2023.  She is due for mammogram.  She denies fevers or chills. She denies pain. Her appetite is good. Her weight {Weight change:10426}.  I have reviewed the past medical history, past surgical history, social history and family history with the patient and they are unchanged from previous note.  ALLERGIES:  is allergic to keflex  [cephalexin ] and clindamycin hcl.  MEDICATIONS:  Current Outpatient Medications  Medication Sig Dispense Refill   anastrozole  (ARIMIDEX ) 1 MG tablet TAKE 1 TABLET BY MOUTH EVERY DAY 90 tablet 3   baclofen  (LIORESAL ) 10 MG tablet Take 1 tablet (10 mg total) by mouth 2 (two) times daily. 180 tablet 3   diclofenac  Sodium (VOLTAREN ) 1 % GEL Apply 2 g topically 4 (four) times daily. (Patient taking differently: Apply 2 g topically 4 (four) times daily as needed (pain).) 150 g 0   docusate sodium  (COLACE) 100 MG capsule Take 100 mg by mouth daily as needed for mild constipation.     DULoxetine  (CYMBALTA ) 60 MG capsule TAKE 1 CAPSULE BY MOUTH EVERY DAY 90 capsule 3  furosemide  (LASIX ) 20 MG tablet Take 1 tablet (20 mg total) by mouth daily. 30 tablet 3   gabapentin  (NEURONTIN ) 600 MG tablet TAKE 2 TABLETS BY MOUTH 3 TIMES A DAY 540 tablet 3   ketorolac  (TORADOL ) 10 MG tablet Take 1 tablet (10 mg total) by mouth every 6 (six) hours as needed. 20 tablet 0   ondansetron  (ZOFRAN -ODT) 8 MG disintegrating  tablet Take 8 mg by mouth every 8 (eight) hours as needed.     oxyCODONE -acetaminophen  (PERCOCET/ROXICET) 5-325 MG tablet Take 1 tablet by mouth every 6 (six) hours as needed for severe pain (pain score 7-10). 15 tablet 0   pravastatin  (PRAVACHOL ) 20 MG tablet Take 1 tablet (20 mg total) by mouth daily. 90 tablet 3   tamsulosin  (FLOMAX ) 0.4 MG CAPS capsule Take 0.4 mg by mouth daily.     tirzepatide  (ZEPBOUND ) 2.5 MG/0.5ML Pen Inject 2.5 mg into the skin once a week. 2 mL 0   venlafaxine  XR (EFFEXOR -XR) 37.5 MG 24 hr capsule Take 1 capsule (37.5 mg total) by mouth daily with breakfast. 90 capsule 1   No current facility-administered medications for this visit.    HISTORY OF PRESENT ILLNESS:   Oncology History Overview Note  Cancer Staging Malignant neoplasm of upper-outer quadrant of right breast in female, estrogen receptor positive (HCC) Staging form: Breast, AJCC 8th Edition - Clinical stage from 11/10/2018: Stage IA (cT1b, cN0, cM0, G2, ER+, PR+, HER2+) - Signed by Lanny Callander, MD on 11/18/2018     Malignant neoplasm of upper-outer quadrant of right breast in female, estrogen receptor positive (HCC)  11/07/2018 Mammogram   Diagnostic 11/07/18 IMPRESSION: 1. 1 x 0.7 x 0.6 cm hypoechoic mass with distortion at the 9-9:30 position of the RIGHT breast 6 cm from the nipple with distortion in the UPPER-OUTER RIGHT breast, corresponding to the mammographic abnormality. One RIGHT axillary lymph node with borderline cortical thickness. Tissue sampling of the RIGHT breast mass and borderline RIGHT axillary lymph node recommended. ADDENDUM: Patient returned today for biopsy of a single borderline thickened lymph node in the RIGHT axilla. I could not reproduce a morphologically abnormal lymph node today, possibly resolved in the interval. Today, only normal appearing lymph nodes were identified in the RIGHT axilla. As such, the axillary ultrasound-guided biopsy was canceled.   11/10/2018 Cancer  Staging   Staging form: Breast, AJCC 8th Edition - Clinical stage from 11/10/2018: Stage IA (cT1b, cN0, cM0, G2, ER+, PR+, HER2+) - Signed by Lanny Callander, MD on 11/18/2018   11/11/2018 Initial Biopsy   Diagnosis 11/11/18 Breast, right, needle core biopsy, upper outer - 9:30 o'clock position - INVASIVE DUCTAL CARCINOMA, GRADE II/III. - SEE MICROSCOPIC DESCRIPTION.   11/11/2018 Receptors her2   Results: IMMUNOHISTOCHEMICAL AND MORPHOMETRIC ANALYSIS PERFORMED MANUALLY The tumor cells are POSITIVE for Her2 (3+). Estrogen Receptor: 95%, POSITIVE, STRONG STAINING INTENSITY Progesterone Receptor: 5%, POSITIVE, STRONG STAINING INTENSITY Proliferation Marker Ki67: 20%   11/17/2018 Initial Diagnosis   Malignant neoplasm of upper-outer quadrant of right breast in female, estrogen receptor positive (HCC)   11/28/2018 Surgery   RIGHT BREAST LUMPECTOMY WITH RADIOACTIVE SEED AND RIGHT AXILLARY SENTINEL LYMPH NODE BIOPSY by Dr. Mikell  11/28/18    11/28/2018 Pathology Results   Diagnosis 11/28/18  1. Breast, lumpectomy, Right w/seed - INVASIVE DUCTAL CARCINOMA, 1.6 CM, NOTTINGHAM GRADE 2 OF 3. - MARGINS OF RESECTION ARE NOT INVOLVED (CLOSEST MARGIN: LESS THAN 1 MM, ANTERIOR). - DUCTAL CARCINOMA IN SITU. - BIOPSY SITE CHANGES. - SEE ONCOLOGY TABLE. 2. Lymph node, sentinel,  biopsy, right Axillary - ONE LYMPH NODE, NEGATIVE FOR CARCINOMA (0/1). 3. Lymph node, sentinel, biopsy, right Axillary - ONE LYMPH NODE, NEGATIVE FOR CARCINOMA (0/1).   11/28/2018 Cancer Staging   Staging form: Breast, AJCC 8th Edition - Pathologic stage from 11/28/2018: Stage IA (pT1c, pN0, cM0, G2, ER+, PR+, HER2+) - Signed by Lanny Callander, MD on 12/11/2018   12/22/2018 Procedure   Baseline ECHO 12/22/18  IMPRESSIONS  1. The left ventricle has normal systolic function with an ejection fraction of 60-65%. The cavity size was normal. There is mild concentric left ventricular hypertrophy. Left ventricular diastolic parameters were normal.  2.  The right ventricle has normal systolic function. The cavity was normal. There is no increase in right ventricular wall thickness.  3. Left atrial size was moderately dilated.  4. The aortic valve is tricuspid. Aortic valve regurgitation was not assessed by color flow Doppler.    01/02/2019 - 02/12/2020 Chemotherapy   Adjuvant chemo weekly Taxol  with Herceptin  starting 01/02/19. Will d/c after 02/13/19 and will switch to Kadcyla  q3weeks starting 02/20/19 due to worsening neuropathy. Due to worsening Muscle cramps Kadcyla  stopped 04/03/19 and switched to Herceptin  q3weeks on 04/23/19. Completed on 02/12/20   04/09/2019 - 05/28/2019 Radiation Therapy   RT with Dr. Shannon 04/09/19-05/28/19   06/29/2019 -  Anti-estrogen oral therapy   Exemestane  25mg  daily starting 06/29/19   04/28/2020 Survivorship   SCP delivered by Lacie Burton, NP       REVIEW OF SYSTEMS:   Constitutional: Denies fevers, chills or abnormal weight loss Eyes: Denies blurriness of vision Ears, nose, mouth, throat, and face: Denies mucositis or sore throat Respiratory: Denies cough, dyspnea or wheezes Cardiovascular: Denies palpitation, chest discomfort or lower extremity swelling Gastrointestinal:  Denies nausea, heartburn or change in bowel habits Skin: Denies abnormal skin rashes Lymphatics: Denies new lymphadenopathy or easy bruising Neurological:Denies numbness, tingling or new weaknesses Behavioral/Psych: Mood is stable, no new changes  All other systems were reviewed with the patient and are negative.   VITALS:  There were no vitals taken for this visit.  Wt Readings from Last 3 Encounters:  02/21/24 237 lb 3.2 oz (107.6 kg)  01/03/24 230 lb (104.3 kg)  12/16/23 230 lb (104.3 kg)    There is no height or weight on file to calculate BMI.  Performance status (ECOG): {CHL ONC [id number]  PHYSICAL EXAM:   GENERAL:alert, no distress and comfortable SKIN: skin color, texture, turgor are normal, no rashes or  significant lesions EYES: normal, Conjunctiva are pink and non-injected, sclera clear OROPHARYNX:no exudate, no erythema and lips, buccal mucosa, and tongue normal  NECK: supple, thyroid  normal size, non-tender, without nodularity LYMPH:  no palpable lymphadenopathy in the cervical, axillary or inguinal LUNGS: clear to auscultation and percussion with normal breathing effort HEART: regular rate & rhythm and no murmurs and no lower extremity edema ABDOMEN:abdomen soft, non-tender and normal bowel sounds Musculoskeletal:no cyanosis of digits and no clubbing  NEURO: alert & oriented x 3 with fluent speech, no focal motor/sensory deficits  LABORATORY DATA:  I have reviewed the data as listed    Component Value Date/Time   NA 142 02/21/2024 1544   K 3.9 02/21/2024 1544   CL 104 02/21/2024 1544   CO2 27 02/21/2024 1544   GLUCOSE 84 02/21/2024 1544   GLUCOSE 96 08/20/2006 1433   BUN 9 02/21/2024 1544   CREATININE 0.68 02/21/2024 1544   CALCIUM  9.0 02/21/2024 1544   PROT 6.6 02/21/2024 1544   ALBUMIN 3.6 11/03/2023 1125  AST 15 02/21/2024 1544   AST 16 10/04/2023 1305   ALT 7 02/21/2024 1544   ALT 8 10/04/2023 1305   ALKPHOS 100 11/03/2023 1125   BILITOT 0.6 02/21/2024 1544   BILITOT 0.6 10/04/2023 1305   GFRNONAA >60 12/13/2023 1343   GFRNONAA >60 10/04/2023 1305   GFRAA >60 06/13/2020 0828   GFRAA >60 04/28/2020 0937    Lab Results  Component Value Date   SPEP Comment 11/19/2018    Lab Results  Component Value Date   WBC 6.5 02/21/2024   NEUTROABS 3,341 02/21/2024   HGB 13.2 02/21/2024   HCT 40.6 02/21/2024   MCV 88.8 02/21/2024   PLT 270 02/21/2024      Chemistry      Component Value Date/Time   NA 142 02/21/2024 1544   K 3.9 02/21/2024 1544   CL 104 02/21/2024 1544   CO2 27 02/21/2024 1544   BUN 9 02/21/2024 1544   CREATININE 0.68 02/21/2024 1544      Component Value Date/Time   CALCIUM  9.0 02/21/2024 1544   ALKPHOS 100 11/03/2023 1125   AST 15  02/21/2024 1544   AST 16 10/04/2023 1305   ALT 7 02/21/2024 1544   ALT 8 10/04/2023 1305   BILITOT 0.6 02/21/2024 1544   BILITOT 0.6 10/04/2023 1305       RADIOGRAPHIC STUDIES: I have personally reviewed the radiological images as listed and agreed with the findings in the report. No results found.

## 2024-04-02 NOTE — Assessment & Plan Note (Signed)
 Stage IA, pT1b,N0,M0, ER/PR+, HER2+, Grade II   --She was diagnosed in 11/2018. She is s/p right breast lumpectomy and SLNB  and adjuvant radiation. She was treated with adjuvant chemo (poorly tolerated Taxol  and Herceptin , d/c after 6 weeks), Kadcyla  for 1 month, then Herceptin  only until 12/2019.  -Antiestrogen therapy was started with Exemestane  in 06/2019. She has tolerated moderately well. Due to known joint pain worsening in shoulders and knees, exemestane  was changed to anastrozole  in June 2023.  -Patient is clinically doing well and continuing to tolerate anastrozole  well. -She is not happy with the asymmetry of her breasts, would like to do breast reductions.  She has seen 2 plastic surgeons, not happy with the recommendations.  -- 04/03/2024 -referral to Dr. Lowery, plastic surgery for consultation. - Reviewed most recent mammogram from July 2025 with negative results.  Repeat expected in July 2026. -Continue anastrozole  1 mg daily. -Labs and follow-up in 6 months, sooner if needed.

## 2024-04-03 ENCOUNTER — Inpatient Hospital Stay: Payer: Federal, State, Local not specified - PPO | Attending: Nurse Practitioner

## 2024-04-03 ENCOUNTER — Inpatient Hospital Stay (HOSPITAL_BASED_OUTPATIENT_CLINIC_OR_DEPARTMENT_OTHER): Payer: Federal, State, Local not specified - PPO | Admitting: Nurse Practitioner

## 2024-04-03 ENCOUNTER — Encounter: Payer: Self-pay | Admitting: Nurse Practitioner

## 2024-04-03 VITALS — BP 146/70 | HR 73 | Temp 97.8°F | Resp 17 | Wt 233.0 lb

## 2024-04-03 DIAGNOSIS — N6489 Other specified disorders of breast: Secondary | ICD-10-CM | POA: Diagnosis not present

## 2024-04-03 DIAGNOSIS — Z923 Personal history of irradiation: Secondary | ICD-10-CM | POA: Diagnosis not present

## 2024-04-03 DIAGNOSIS — Z170 Estrogen receptor positive status [ER+]: Secondary | ICD-10-CM

## 2024-04-03 DIAGNOSIS — Z79811 Long term (current) use of aromatase inhibitors: Secondary | ICD-10-CM | POA: Diagnosis not present

## 2024-04-03 DIAGNOSIS — Z9221 Personal history of antineoplastic chemotherapy: Secondary | ICD-10-CM | POA: Insufficient documentation

## 2024-04-03 DIAGNOSIS — Z1721 Progesterone receptor positive status: Secondary | ICD-10-CM | POA: Diagnosis not present

## 2024-04-03 DIAGNOSIS — G629 Polyneuropathy, unspecified: Secondary | ICD-10-CM | POA: Insufficient documentation

## 2024-04-03 DIAGNOSIS — C50411 Malignant neoplasm of upper-outer quadrant of right female breast: Secondary | ICD-10-CM | POA: Insufficient documentation

## 2024-04-03 DIAGNOSIS — M8588 Other specified disorders of bone density and structure, other site: Secondary | ICD-10-CM | POA: Insufficient documentation

## 2024-04-03 DIAGNOSIS — G2581 Restless legs syndrome: Secondary | ICD-10-CM | POA: Diagnosis not present

## 2024-04-03 DIAGNOSIS — Z1731 Human epidermal growth factor receptor 2 positive status: Secondary | ICD-10-CM | POA: Insufficient documentation

## 2024-04-03 LAB — CBC WITH DIFFERENTIAL (CANCER CENTER ONLY)
Abs Immature Granulocytes: 0.02 K/uL (ref 0.00–0.07)
Basophils Absolute: 0.1 K/uL (ref 0.0–0.1)
Basophils Relative: 1 %
Eosinophils Absolute: 0.2 K/uL (ref 0.0–0.5)
Eosinophils Relative: 3 %
HCT: 43.1 % (ref 36.0–46.0)
Hemoglobin: 13.9 g/dL (ref 12.0–15.0)
Immature Granulocytes: 0 %
Lymphocytes Relative: 38 %
Lymphs Abs: 2.4 K/uL (ref 0.7–4.0)
MCH: 28.8 pg (ref 26.0–34.0)
MCHC: 32.3 g/dL (ref 30.0–36.0)
MCV: 89.4 fL (ref 80.0–100.0)
Monocytes Absolute: 0.5 K/uL (ref 0.1–1.0)
Monocytes Relative: 9 %
Neutro Abs: 3.1 K/uL (ref 1.7–7.7)
Neutrophils Relative %: 49 %
Platelet Count: 229 K/uL (ref 150–400)
RBC: 4.82 MIL/uL (ref 3.87–5.11)
RDW: 13.3 % (ref 11.5–15.5)
WBC Count: 6.3 K/uL (ref 4.0–10.5)
nRBC: 0 % (ref 0.0–0.2)

## 2024-04-03 LAB — CMP (CANCER CENTER ONLY)
ALT: 10 U/L (ref 0–44)
AST: 18 U/L (ref 15–41)
Albumin: 4.1 g/dL (ref 3.5–5.0)
Alkaline Phosphatase: 113 U/L (ref 38–126)
Anion gap: 7 (ref 5–15)
BUN: 16 mg/dL (ref 8–23)
CO2: 29 mmol/L (ref 22–32)
Calcium: 9.4 mg/dL (ref 8.9–10.3)
Chloride: 106 mmol/L (ref 98–111)
Creatinine: 0.74 mg/dL (ref 0.44–1.00)
GFR, Estimated: >60 mL/min (ref >60)
Glucose, Bld: 93 mg/dL (ref 70–99)
Potassium: 4.1 mmol/L (ref 3.5–5.1)
Sodium: 142 mmol/L (ref 135–145)
Total Bilirubin: 0.9 mg/dL (ref 0.0–1.2)
Total Protein: 7 g/dL (ref 6.5–8.1)

## 2024-04-03 LAB — IRON AND IRON BINDING CAPACITY (CC-WL,HP ONLY)
Iron: 112 ug/dL (ref 28–170)
Saturation Ratios: 34 % — ABNORMAL HIGH (ref 10.4–31.8)
TIBC: 326 ug/dL (ref 250–450)
UIBC: 214 ug/dL (ref 148–442)

## 2024-04-03 LAB — VITAMIN D 25 HYDROXY (VIT D DEFICIENCY, FRACTURES): Vit D, 25-Hydroxy: 38.77 ng/mL (ref 30–100)

## 2024-04-03 LAB — FERRITIN: Ferritin: 147 ng/mL (ref 11–307)

## 2024-04-03 MED ORDER — ROPINIROLE HCL 0.25 MG PO TABS: 0.2500 mg | ORAL_TABLET | Freq: Three times a day (TID) | ORAL | 1 refills | Status: AC

## 2024-04-06 ENCOUNTER — Telehealth: Payer: Self-pay | Admitting: Nurse Practitioner

## 2024-04-06 NOTE — Telephone Encounter (Signed)
 Scheduled appointments per 7/25 los. Called and left a VM with appointment details for the patient.

## 2024-04-09 ENCOUNTER — Encounter: Payer: Self-pay | Admitting: Nurse Practitioner

## 2024-04-21 ENCOUNTER — Telehealth: Payer: Self-pay

## 2024-04-21 NOTE — Telephone Encounter (Signed)
 Notified the pt regarding her FMLA form being completed and ready for pick up. Pt verbalized understanding. Pt will pick up form 04/21/2024. No questions or concerns to be noted at this time.

## 2024-05-01 ENCOUNTER — Encounter: Payer: Self-pay | Admitting: Family Medicine

## 2024-05-01 ENCOUNTER — Encounter: Payer: Self-pay | Admitting: Internal Medicine

## 2024-05-14 ENCOUNTER — Encounter: Admitting: Internal Medicine

## 2024-05-19 ENCOUNTER — Other Ambulatory Visit: Payer: Self-pay | Admitting: Family Medicine

## 2024-05-19 DIAGNOSIS — M7989 Other specified soft tissue disorders: Secondary | ICD-10-CM

## 2024-05-25 ENCOUNTER — Ambulatory Visit: Admitting: Internal Medicine

## 2024-05-25 ENCOUNTER — Encounter: Payer: Self-pay | Admitting: Internal Medicine

## 2024-05-25 VITALS — BP 136/80 | HR 74 | Temp 98.6°F | Ht 66.0 in | Wt 233.0 lb

## 2024-05-25 DIAGNOSIS — R739 Hyperglycemia, unspecified: Secondary | ICD-10-CM

## 2024-05-25 DIAGNOSIS — Z0000 Encounter for general adult medical examination without abnormal findings: Secondary | ICD-10-CM

## 2024-05-25 DIAGNOSIS — G620 Drug-induced polyneuropathy: Secondary | ICD-10-CM

## 2024-05-25 DIAGNOSIS — E782 Mixed hyperlipidemia: Secondary | ICD-10-CM | POA: Diagnosis not present

## 2024-05-25 DIAGNOSIS — H538 Other visual disturbances: Secondary | ICD-10-CM | POA: Diagnosis not present

## 2024-05-25 DIAGNOSIS — T451X5A Adverse effect of antineoplastic and immunosuppressive drugs, initial encounter: Secondary | ICD-10-CM | POA: Diagnosis not present

## 2024-05-25 DIAGNOSIS — Z0001 Encounter for general adult medical examination with abnormal findings: Secondary | ICD-10-CM | POA: Diagnosis not present

## 2024-05-25 DIAGNOSIS — H532 Diplopia: Secondary | ICD-10-CM

## 2024-05-25 MED ORDER — SEMAGLUTIDE-WEIGHT MANAGEMENT 1 MG/0.5ML ~~LOC~~ SOAJ: 1.0000 mg | SUBCUTANEOUS | 0 refills | Status: AC

## 2024-05-25 MED ORDER — SEMAGLUTIDE-WEIGHT MANAGEMENT 2.4 MG/0.75ML ~~LOC~~ SOAJ: 2.4000 mg | SUBCUTANEOUS | 0 refills | Status: AC

## 2024-05-25 MED ORDER — SEMAGLUTIDE-WEIGHT MANAGEMENT 1.7 MG/0.75ML ~~LOC~~ SOAJ: 1.7000 mg | SUBCUTANEOUS | 0 refills | Status: AC

## 2024-05-25 MED ORDER — SEMAGLUTIDE-WEIGHT MANAGEMENT 0.5 MG/0.5ML ~~LOC~~ SOAJ
0.5000 mg | SUBCUTANEOUS | 0 refills | Status: AC
Start: 2024-06-23 — End: 2024-07-21

## 2024-05-25 MED ORDER — SEMAGLUTIDE-WEIGHT MANAGEMENT 0.25 MG/0.5ML ~~LOC~~ SOAJ
0.2500 mg | SUBCUTANEOUS | 0 refills | Status: AC
Start: 2024-05-25 — End: 2024-06-22

## 2024-05-25 NOTE — Patient Instructions (Signed)
 We will check the labs.   We have sent in wegovy .

## 2024-05-25 NOTE — Progress Notes (Signed)
 "  Subjective:   Patient ID: Summer [name], female    DOB: April 20, 1951, 73 y.o.   MRN: [id number]  The patient is here for physical. Pertinent topics discussed: Discussed the use of AI scribe software for clinical note transcription with the patient, who gave verbal consent to proceed.  FMLA intermittent  History of Present Illness Summer [name] is a 74 year old female who presents with leg swelling and pain.  She experiences persistent leg swelling, which she attributes to prolonged sitting. The swelling is accompanied by pain radiating from her hip to her knee, causing difficulty in walking and making her feel 'like an old lady.' The pain is particularly pronounced when standing after sitting for extended periods. She recalls that a previous doctor told her the pain might be related to her hip or back, and mentioned her weight as a possible contributing factor.  She also experiences significant pain in her toes, which she suspects is related to a previous diagnosis of hammer toe. This pain contributes to her difficulty in walking and fear of falling.  She describes episodes of blurred vision and feeling 'woozy' when watching television or removing her glasses. Additionally, she has noticed a change in her taste, requiring more salt to achieve the same flavor, and a diminished intensity in sweet flavors. She recalls having COVID-19 in the past but does not associate these changes with the infection.  She reports experiencing shortness of breath when lying down, necessitating her to sit up to breathe comfortably. No new chest pain, tightness, or pressure is noted.  She recalls having had issues with vitamin D  in the past and has previously taken high-dose replacement therapy. She is concerned about her weight and has been exploring options for weight management. She works eight hours a day and finds it difficult to prepare meals, often resorting to quick snacks like potato  chips.  PMH, [facility], social history reviewed and updated  Review of Systems  Constitutional:  Positive for activity change.  HENT: Negative.    Eyes:  Positive for visual disturbance.  Respiratory:  Positive for shortness of breath. Negative for cough and chest tightness.   Cardiovascular:  Negative for chest pain, palpitations and leg swelling.  Gastrointestinal:  Negative for abdominal distention, abdominal pain, constipation, diarrhea, nausea and vomiting.  Musculoskeletal:  Positive for arthralgias and back pain.  Skin: Negative.   Neurological:  Positive for numbness.  Psychiatric/Behavioral: Negative.      Objective:  Physical Exam Constitutional:      Appearance: She is well-developed.  HENT:     Head: Normocephalic and atraumatic.  Cardiovascular:     Rate and Rhythm: Normal rate and regular rhythm.  Pulmonary:     Effort: Pulmonary effort is normal. No respiratory distress.     Breath sounds: Normal breath sounds. No wheezing or rales.  Abdominal:     General: Bowel sounds are normal. There is no distension.     Palpations: Abdomen is soft.     Tenderness: There is no abdominal tenderness.  Musculoskeletal:        General: Tenderness present.     Cervical back: Normal range of motion.  Skin:    General: Skin is warm and dry.  Neurological:     Mental Status: She is alert and oriented to person, place, and time.     Coordination: Coordination normal.     Vitals:   05/25/24 1550  BP: 136/80  Pulse: 74  Temp: 98.6 F (37 C)  TempSrc: Oral  SpO2: 97%  Weight: 233 lb (105.7 kg)  Height: 5' 6 (1.676 m)    Assessment & Plan:  Assessment and Plan Assessment & Plan Chronic lower extremity pain and swelling   She experiences chronic pain and swelling in her lower extremities, worsened by sitting, with pain radiating from hip to knee. Differential diagnosis includes hip or back issues, with possible hammer toe involvement. She has difficulty walking and fears  falling due to the pain. Complete FMLA paperwork for intermittent leave due to chronic pain and swelling.  Morbid Obesity   Her obesity exacerbates lower extremity pain. Previous weight loss medication attempts were unsuccessful. Discussed Wegovy  and Zepbound  options, including insurance implications and potential weight regain without lifestyle changes. Attempt to obtain approval for Wegovy  for weight loss. Discuss the option to purchase Zepbound  directly from the manufacturer if insurance does not approve Wegovy .  Impaired taste sensation   She has a decreased ability to taste salt and sweet flavors, with no recent viral illness or COVID-19 infection coinciding with symptom onset.  Blurred vision and visual disturbance   She experiences intermittent blurred vision and wooziness with no clear etiology. There is no suspicion of diabetes-related eye issues. Check blood glucose levels to rule out diabetes.  Dyspnea when lying down (orthopnea)   She experiences dyspnea when lying down. There is no history of heart failure, and recent cardiac evaluations and blood work are normal.   "

## 2024-05-26 ENCOUNTER — Ambulatory Visit: Payer: Self-pay | Admitting: Internal Medicine

## 2024-05-26 ENCOUNTER — Encounter: Payer: Self-pay | Admitting: Internal Medicine

## 2024-05-26 LAB — LIPID PANEL
Cholesterol: 217 mg/dL — ABNORMAL HIGH (ref 0–200)
HDL: 65.3 mg/dL (ref >39.00)
LDL Cholesterol: 136 mg/dL — ABNORMAL HIGH (ref 0–99)
NonHDL: 151.86
Total CHOL/HDL Ratio: 3
Triglycerides: 77 mg/dL (ref 0.0–149.0)
VLDL: 15.4 mg/dL (ref 0.0–40.0)

## 2024-05-26 LAB — VITAMIN B12: Vitamin B-12: 324 pg/mL (ref 211–911)

## 2024-05-26 LAB — HEMOGLOBIN A1C: Hgb A1c MFr Bld: 6.1 % (ref 4.6–6.5)

## 2024-05-26 LAB — BRAIN NATRIURETIC PEPTIDE: Pro B Natriuretic peptide (BNP): 16 pg/mL (ref 0.0–100.0)

## 2024-05-26 NOTE — Assessment & Plan Note (Signed)
 Checking lipid panel and adjust pravastatin  as needed she reports not taking during our visit.

## 2024-05-26 NOTE — Assessment & Plan Note (Signed)
 Checking HGA1c and adjust as needed.

## 2024-05-26 NOTE — Assessment & Plan Note (Signed)
 Flu shot declines. Pneumonia declines. Shingrix declines. Tetanus declines. Colonoscopy up to date. Mammogram up to date, pap smear not indicated and dexa complete. Counseled about sun safety and mole surveillance. Counseled about the dangers of distracted driving. Given 10 year screening recommendations.

## 2024-05-26 NOTE — Assessment & Plan Note (Signed)
 She is still having some wooziness in her eyes. Refer to opthalmology as she is having trouble calling and getting in herself for testing.

## 2024-05-26 NOTE — Assessment & Plan Note (Signed)
 Stable overall and still present. Using gabapentin  for pain and refill as needed.

## 2024-05-26 NOTE — Assessment & Plan Note (Signed)
 Her obesity exacerbates lower extremity pain. Previous weight loss medication attempts were unsuccessful. Discussed Wegovy  and Zepbound  options, including insurance implications and potential weight regain without lifestyle changes. Attempt to obtain approval for Wegovy  for weight loss. Discuss the option to purchase Zepbound  directly from the manufacturer if insurance does not approve Wegovy .

## 2024-05-29 ENCOUNTER — Telehealth: Payer: Self-pay

## 2024-05-29 NOTE — Telephone Encounter (Signed)
 Patient FMLA forms have been recveid and signed and are ready for pick   I have called patient and left her copy up front and second copy has been placed in scanned box.

## 2024-06-02 ENCOUNTER — Encounter: Payer: Self-pay | Admitting: Hematology

## 2024-06-05 ENCOUNTER — Other Ambulatory Visit (HOSPITAL_BASED_OUTPATIENT_CLINIC_OR_DEPARTMENT_OTHER): Payer: Self-pay

## 2024-06-08 ENCOUNTER — Encounter: Payer: Self-pay | Admitting: Internal Medicine

## 2024-06-09 ENCOUNTER — Telehealth: Payer: Self-pay

## 2024-06-09 ENCOUNTER — Other Ambulatory Visit (HOSPITAL_COMMUNITY): Payer: Self-pay

## 2024-06-09 NOTE — Telephone Encounter (Signed)
 Pharmacy Patient Advocate Encounter   Received notification from Pt Calls Messages that prior authorization for Wegovy  0.25mg /0.85ml is required/requested.   Insurance verification completed.   The patient is insured through [employer] [facility] .   Per test claim: PA required; PA submitted to above mentioned insurance via Latent Key/confirmation #/EOC [id number] Status is pending

## 2024-06-10 ENCOUNTER — Other Ambulatory Visit: Payer: Self-pay | Admitting: Neurology

## 2024-06-10 NOTE — Telephone Encounter (Signed)
Pt was notified of denial.

## 2024-06-10 NOTE — Telephone Encounter (Signed)
 Pharmacy Patient Advocate Encounter  Received notification from [facility] that Prior Authorization for Wegovy  0.25mg /0.36ml has been DENIED.  See denial reason below. No denial letter attached in CMM. Will attach denial letter to Media tab once received.   PA #/Case ID/Reference #: [id number]

## 2024-06-22 ENCOUNTER — Encounter: Payer: Self-pay | Admitting: Internal Medicine

## 2024-06-23 ENCOUNTER — Encounter: Payer: Self-pay | Admitting: Internal Medicine

## 2024-06-23 NOTE — Telephone Encounter (Signed)
 Please advise, pt can just contact the pharmacy in regards to this?

## 2024-07-01 ENCOUNTER — Encounter: Payer: Self-pay | Admitting: Neurology

## 2024-07-01 ENCOUNTER — Ambulatory Visit: Payer: Federal, State, Local not specified - PPO | Admitting: Neurology

## 2024-07-01 ENCOUNTER — Encounter: Payer: Self-pay | Admitting: Hematology

## 2024-07-01 VITALS — BP 143/81 | HR 69 | Ht 66.0 in | Wt 236.5 lb

## 2024-07-01 DIAGNOSIS — M5441 Lumbago with sciatica, right side: Secondary | ICD-10-CM | POA: Diagnosis not present

## 2024-07-01 DIAGNOSIS — G500 Trigeminal neuralgia: Secondary | ICD-10-CM | POA: Diagnosis not present

## 2024-07-01 DIAGNOSIS — G8929 Other chronic pain: Secondary | ICD-10-CM | POA: Diagnosis not present

## 2024-07-01 MED ORDER — DULOXETINE HCL 60 MG PO CPEP: ORAL_CAPSULE | ORAL | 3 refills | Status: AC

## 2024-07-01 NOTE — Progress Notes (Signed)
 PATIENT: Summer [name] DOB: 08/04/1951  REASON FOR VISIT: Follow up right sided trigeminal neuralgia  HISTORY FROM: Patient PRIMARY NEUROLOGIST: Dr. Onita   ASSESSMENT AND PLAN 73 y.o. year old female   1.  Right-sided trigeminal neuralgia (History of MVD 2006, Gamma Knife last 2017 with Dr. Nancye)  -Under good control overall, had to go back into the office for her job.  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  Will complete FMLA. Will ask she work 8 hours vs 12 hours 5 days a week. -Continue current regimen:  -Baclofen  10 mg twice a day -Gabapentin  600 mg, 2 tablets 3 times daily -Cymbalta  60 mg daily  -In the past has tried Lamictal , carbamazepine, Trileptal , Lyrica -Not interested in going back to Dr. Nancye  -Follow-up in 6 months   HISTORY  Summer [name] is a 73 years old right-handed African American female, referred by her primary care physician Dr. Reyes Boas for evaluation of right facial pain   She was diagnosed with right trigeminal neuralgia in 2006, presenting with severe electronics shooting pain at her right upper molar, she had microvascular decompression surgery at [facility] by Dr. Norleen Blush around 2006, which has helped the severe pain, but she continued to have annoying almost constant pain at her right face, she describes hot cold sensitivity over right face, touching of right forehead region will also set of her right facial pain, she felt right tongue swelling, she bites on her right tongue sometimes, she works at customer service, on the phone many hours each day, with prolonged talking, she felt sandpaper was rubbing against her right tongue, also complains of right upper and lower lip numbness, intermittent electronics shooting pain,   Over the years, she has tried different medication, was evaluated by our office Dr. Chalice in 2008, she has tried gabapentin , Lyrica, Cymbalta , Trileptal , currently  is taking Tegretol 200 mg twice a day, initially it helps her some, but failed to help work consistently, She denied visual change, recent few months of left lower back, left hip pain, radiating to left posterior thigh, to her left leg and left foot, difficulty walking after prolonged sitting  She complains of left low back pain, left hip pain, radiating pain to left foot, like walking on a dead, burning sensation, she continued to complains of right facial constant achy pain, there was no significant change after started on Oxtellar xR  300 mg every day, no significant side effects,   I have reviewed MRI, mild small vessel disease, no significant abnormality otherwise,   She had MRI lumbar spine (without) demonstrating: L5-S1: facet hypertrophy with moderate right and severe left foraminal stenosis  L4-5: pseudo disc bulging and facet hypertrophy with moderate right and mild left foraminal stenosis. There is 2 mm anterior spondylolisthesis of L4 on L5.  Degenerative spondylosis and disc bulging L1-2 and L4-5.   UPDATE Sep 30 2014:YY She felt sand paper sensation in her right tongue, flashing sensation, last 1-2 weeks, only take gabapentin  300mg  tid now. She also complains of snoring, frequent wakening during the nighttime, excessive sleepiness at daytime, F ESS score is 37, ESS is 9   UPDATE 01/26/2017[name], 73 year old female returns for follow-up. She has a history of right trigeminal neuralgia is currently on Neurontin  300 mg 3 times a day has breakthrough pain. Unfortunately she is a Clinical biochemist rep and has to talk on the telephone which by the end of  the day makes her facial pain worse. She occasionally will have upper and lower lip numbness. She returns for reevaluation   UPDATE February 09 2016:YY She complained increased right facial pain since April 2017, involving right V1/V2 branch, she is taking gabapentin  300 mg 4 times a day, only helped her mildly, triggered by wind blow on her  right face, talking, chewing, she works as a Programmer, multimedia, it is very difficult for her to perform her job,   Update March 01 2016:YY She presented to the emergency room February 21 2016 for nausea, unexpected fall, night before that because severe facial pain, she took extra dose of lamotrigine , made it to 250 mg that particular day, laboratory evaluation showed mildly low potassium of CMP WAS GIVEN SUPPLEMENT, NORMAL CBC, lamotrigine  level in June 15 was 15.6,  For a while, she had severe right facial pain, was taking gabapentin  300 mg 2 tablets every 2 hours, 3 tablets at night, about 11 tablets each day 3300 mg every day plus lamotrigine  100 mg twice a day,    Lamotrigine  100 mg twice a day has been very helpful, it has taking care of her long bothered right tongue sandpaper sensation. She is now taking gabapentin  300 mg 3/2/3 tablets each day, she has been off lamotrigine  since February 26 2016, the sandpaper sensation on the right side of the tongue came back, she is now taking Cymbalta  30 mg 3 times a day   She has been complains 3 weeks history of urinary urgency, difficulty initiating urine, UA showed mild UTI, was treated with Cipro  without improving her symptoms   UPDATE May 10 2016:YY She was evaluated by Dr. Nancye in July 2017, is planning on to have gamma knife in early September, she complains of mild memory loss, difficulty handling her computer tasks sometimes, there was no family history of memory loss, she also complains of episode, right after she get out of the bed,  She is on polypharmacy, now taking lamotrigine  100 mg twice a day, gabapentin  300 mg 4 in the morning, 2 at lunch time, 4 tablets at nighttime. She also has obesity, snoring, excessive daytime fatigue and sleepiness   We have personally reviewed CT head without contrast in June 2017, no acute abnormality, MRI of the cervical in 2008, multilevel degenerative changes, most severe at C5-6, C6-7, with herniated  disc, mild canal stenosis, mild cord signal changes.   Laboratory evaluation in 2017, elevated WBC 11 point 8, hemoglobin of 13, no significant abnormality on BMP   UPDATE Apr 02 2017:YY We have personally reviewed MRI of the brain in April 2018 generalized atrophy mild supratentorium small vessel disease MRI of cervical spine April, multilevel degenerative changes most severe at C6-7, mild canal stenosissignificant foraminal narrowing or signal changes,   She had a, knife by [facility] Dr. Garnette Nancye in September 2017 she denies significant improvement, now cold air still trigger her right facial pain, bleeding from right temporal region along the right face, right jaw, electronic shooting pain, to her right lower molar region, she has intermittent left hand tremor, driving,   She is now taking Lamictal  100 mg 3 tablets each day, gabapentin  600 mg 5 tablets each day, despite combination therapy, she remains symptomatic, she is able to go to work.  She is on the phone 10 hours a day, mild memory loss.  I reviewed laboratory evaluation in 2018, negative troponin, CBC with hemoglobin of 11.9, CMP, creatinine of 0.84, ESR, mild elevated C reactive protein 7.8,  lamotrigine  12.9, TSH 1.2 7,    UPDATE Sept 1 2020: She was diagnosed with right breast cancer, had right lobectomy followed by chemo and radiation therapy, since April 2020, she noticed increased bilateral feet paresthesia, and also intermittent bilateral finger paresthesia, despite taking gabapentin  600 mg up to 3 times a day, her right trigeminal pain is under good control with current dose, she also has frequent lower extremity muscle spasm, has tried muscle relaxant methocarbamol  with limited help   Virtual Visit via Video Aug 10, 2020 She has left knee replacement on Jun 20 2020, recovering well, still in the process of PT, out of work until Jan 2021.   She complains of left low back pain, radiating pain to left hip for 6 months, even  before her left knee replacement, now made it worse by prolonged sitting,   I personally reviewed x-ray of lumbar in April 2021, significant multilevel degenerative disease, scoliosis   Her right facial trigeminal pain is under reasonable control with current dose of gabapentin  600 mg 2 tablets 3 times a day,   UPDATE March 09 2021: She complains of mild right tongue discomfort, feel swollen, despite taking gabapentin  600mg  2 tab tid,   Update October 17, 2021 SS: Tried the Cymbalta  60 mg daily, noted not much change Taking gabapentin  600 mg, 2 tablets 3 times daily, pain is under stable control. Has had gamma knife twice, MVD in past. 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   Today 06/19/22 SS: Right TN pain is under good control. We added baclofen  10 mg BID last time with good benefit. Still on gabapentin  600 mg TID, Cymbalta  60 mg daily. Denies any side effects. Had cataract surgery to left eye. Had SBO in August. Planning to have breast augmentation, abdominoplasty. Still working at call center. Still has some TN pain, but is not significant. Baclofen  has really helped. Has intermittent feeling of numbness, to right mouth. Rarely has sharp, electrical pain to right face. Might consider dose reduction when she retires. She does a lot of talking with her job. 04/15/22 creatinine 0.64.  Update June 26, 2023 SS: Has been taking gabapentin  600 mg TID, Cymbalta  60 mg daily, has not been taking baclofen  10 mg BID for several weeks.  Has noted increased right sided facial pain, electric stabs.  At times feels like her tongue swells, her tongue is like sandpaper.  Has had some vague feeling of dizziness.  Update July 01, 2024 SS: Had to go back into the office for work, the office air triggers her TN. Needs head set modification, able to wear head cover scarf. FMLA paperwork often works 8-12 hours, more than 8 hours, triggers symptoms. 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 Needs FMLA completed.  On Cymbalta  and gabapentin , baclofen . TN under good control.   REVIEW OF SYSTEMS: Out of a complete 14 system review of symptoms, the patient complains only of the following symptoms, and all other reviewed systems are negative.  See HPI  ALLERGIES: Allergies  Allergen Reactions   Keflex  [Cephalexin ] Anaphylaxis and Other (See Comments)    Caused violent dizziness and syncope   Clindamycin Hcl Itching    HOME MEDICATIONS: Outpatient Medications Prior to Visit  Medication Sig Dispense Refill   anastrozole  (ARIMIDEX ) 1 MG tablet TAKE 1 TABLET BY MOUTH EVERY DAY 90 tablet 3   baclofen  (LIORESAL ) 10 MG tablet Take 1 tablet (10 mg  total) by mouth 2 (two) times daily. 180 tablet 3   diclofenac  Sodium (VOLTAREN ) 1 % GEL Apply 2 g topically 4 (four) times daily. (Patient taking differently: Apply 2 g topically 4 (four) times daily as needed (pain).) 150 g 0   gabapentin  (NEURONTIN ) 600 MG tablet TAKE 2 TABLETS BY MOUTH 3 TIMES A DAY 540 tablet 3   furosemide  (LASIX ) 20 MG tablet TAKE 1 TABLET BY MOUTH EVERY DAY (Patient not taking: Reported on 07/01/2024) 90 tablet 1   pravastatin  (PRAVACHOL ) 20 MG tablet Take 1 tablet (20 mg total) by mouth daily. (Patient not taking: Reported on 07/01/2024) 90 tablet 3   rOPINIRole  (REQUIP ) 0.25 MG tablet Take 1 tablet (0.25 mg total) by mouth 3 (three) times daily. (Patient not taking: Reported on 07/01/2024) 30 tablet 1   semaglutide -weight management (WEGOVY ) 0.5 MG/0.5ML SOAJ SQ injection Inject 0.5 mg into the skin once a week for 28 days. (Patient not taking: Reported on 07/01/2024) 2 mL 0   [START ON 07/22/2024] semaglutide -weight management (WEGOVY ) 1 MG/0.5ML SOAJ SQ injection Inject 1 mg into the skin once a week for 28 days. (Patient not taking: Reported on 07/01/2024) 2 mL 0   [START ON 08/20/2024] semaglutide -weight management (WEGOVY ) 1.7 MG/0.75ML SOAJ SQ injection Inject 1.7 mg into  the skin once a week for 28 days. (Patient not taking: Reported on 07/01/2024) 3 mL 0   [START ON 09/18/2024] semaglutide -weight management (WEGOVY ) 2.4 MG/0.75ML SOAJ SQ injection Inject 2.4 mg into the skin once a week for 28 days. (Patient not taking: Reported on 07/01/2024) 3 mL 0   tamsulosin  (FLOMAX ) 0.4 MG CAPS capsule Take 0.4 mg by mouth daily. (Patient not taking: Reported on 07/01/2024)     venlafaxine  XR (EFFEXOR -XR) 37.5 MG 24 hr capsule Take 1 capsule (37.5 mg total) by mouth daily with breakfast. (Patient not taking: Reported on 07/01/2024) 90 capsule 1   DULoxetine  (CYMBALTA ) 60 MG capsule TAKE 1 CAPSULE BY MOUTH EVERY DAY (Patient not taking: Reported on 07/01/2024) 90 capsule 3   No facility-administered medications prior to visit.    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   Complication of anesthesia    Pt states having some difficulty waking up   Constipation 07/01/2018   Costochondritis 05/04/2014   Depression    Diverticulitis of colon 06/13/2007   S/p segmental colectomy for perforated diverticulitis    Diverticulosis    Facial pain 10/06/2015   Fibromyalgia    Foot sprain, left, initial encounter 08/31/2016   Glaucoma NO DROPS   Glossitis 02/02/2016   Helicobacter pylori gastritis 10/2020   HEMATURIA UNSPECIFIED 06/25/2008   Qualifier: Diagnosis of  By: Krystal MD, Reyes LABOR    History of kidney stones    History of shingles 2012--  no residual pain   Hx of adenomatous colonic polyps 10/2020   Hypertension    Itching 10/13/2018   Left eye pain  07/01/2018   Left ureteral calculus    LOC OSTEOARTHROS NOT SPEC PRIM/SEC OTH [facility] SITE 06/27/2009  Qualifier: Diagnosis of  By: Krystal MD, Reyes A    Malignant neoplasm of upper-outer quadrant of right breast in female, 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   MCI (mild cognitive impairment)    Memory loss 12/21/2016   Morbid obesity (HCC) 11/09/2010   Myocardial infarction (HCC)    Pain of right side of body 08/31/2016   Personal history of chemotherapy    Personal history of radiation therapy    Pruritus 10/26/2016   Renal disorder    hx of   Tremor 12/21/2016   Trigeminal neuralgia RIGHT   Trigeminal neuralgia of right side of face 11/30/2010   Trochanteric bursitis, right hip 10/26/2016   Urgency of urination    Weight loss 07/22/2017   Zoster without complications 09/27/2011    PAST SURGICAL HISTORY: Past Surgical History:  Procedure Laterality Date   BREAST BIOPSY Right 11/11/2018   BREAST LUMPECTOMY Right 11/28/2018   BREAST LUMPECTOMY WITH RADIOACTIVE SEED AND SENTINEL LYMPH NODE BIOPSY Right 11/28/2018   Procedure: RIGHT BREAST LUMPECTOMY WITH RADIOACTIVE SEED AND RIGHT AXILLARY SENTINEL LYMPH NODE BIOPSY;  Surgeon: Mikell Katz, MD;  Location: [facility];  Service: General;  Laterality: Right;   CEREBRAL MICROVASCULAR DECOMPRESSION  07/05/2006   RIGHT TRIGEMINAL NERVE   COLON SURGERY  2009   COLONOSCOPY  2013   CG-MAC-moviprep  (exc)-normal -mild tics   CYSTOSCOPY/RETROGRADE/URETEROSCOPY Right 12/16/2023   Procedure: CYSTOSCOPY/RETROGRADE/URETEROSCOPY;  Surgeon: Carolee Sherwood JONETTA DOUGLAS, MD;  Location: [facility];  Service: Urology;  Laterality: Right;  RIGHT RETROGRADE PYELOGRAM   CYSTOSCOPY/RETROGRADE/URETEROSCOPY/STONE EXTRACTION WITH BASKET  07/04/2012   Procedure: CYSTOSCOPY/RETROGRADE/URETEROSCOPY/STONE EXTRACTION WITH BASKET;  Surgeon: Mark C Ottelin, MD;  [facility];  Service: Urology;  Laterality: Left;   EXPLORATORY LAPAROTOMY/  RESECTION MID TO DISTAL SIGMOID AND PROXIMAL RECTUM/ END PROXIMAL SIGMOID COLOSTOMY  07/17/2006   PERFORATED DIVERTICULITIS WITH PERITONITIS   EXTRACORPOREAL SHOCK WAVE LITHOTRIPSY Left 01/19/2021   Procedure: LEFT EXTRACORPOREAL SHOCK WAVE LITHOTRIPSY (ESWL);  Surgeon: Gaston [place], MD;  Location: [facility];  Service: Urology;  Laterality: Left;   EXTRACORPOREAL SHOCK WAVE LITHOTRIPSY Right 11/08/2023   Procedure: RIGHT EXTRACORPOREAL SHOCK WAVE LITHOTRIPSY (ESWL);  Surgeon: Devere Lonni Righter, MD;  Location: [facility];  Service: Urology;  Laterality: Right;  75 MINUTE CASE   gamma knife  05/17/2016   for trigeminal neuralgia, [facility], Dr Nancye   gamma knife  2019   IR CV LINE INJECTION  10/27/2019   PORTACATH PLACEMENT Left 11/28/2018   Procedure: INSERTION PORT-A-CATH WITH ULTRASOUND;  Surgeon: Mikell Katz, MD;  Location: [facility];  Service: General;  Laterality: Left;   portacath removal     RESECTION COLOSTOMY/ CLOSURE COLOSTOMY WITH COLOPROCTOSTOMY  02/20/2007   TONSILLECTOMY  AS CHILD   TOTAL KNEE ARTHROPLASTY Left 06/20/2020   Procedure: LEFT TOTAL KNEE ARTHROPLASTY;  Surgeon: Jerri Kay HERO, MD;  Location: [facility];  Service: Orthopedics;  Laterality: Left;   URETEROSCOPY  07/04/2012   Procedure: URETEROSCOPY;  Surgeon: Mark C Ottelin, MD;  Location: [facility];  Service: Urology;  Laterality: Left;   VAGINAL HYSTERECTOMY  1998   Partial   WISDOM TOOTH EXTRACTION      FAMILY HISTORY: Family History  Problem Relation Age of Onset   Heart disease Maternal Grandmother    Diabetes Mother    Breast cancer Maternal Aunt    Lupus Cousin    Colon cancer Neg Hx    Esophageal cancer Neg Hx    Rectal cancer Neg Hx  Stomach cancer Neg Hx    Colon polyps Neg Hx     SOCIAL HISTORY: Social History   Socioeconomic History   Marital status: Widowed    Spouse name: Not on file   Number of children: 1   Years of education: College    Highest education level: Not on file  Occupational History    Employer: UNEMPLOYED   Occupation: HUMAN RESOURCES     Employer: [employer]  Tobacco Use   Smoking status: Never    Passive exposure: Never   Smokeless tobacco: Never  Vaping Use   Vaping status: Never Used  Substance and Sexual Activity   Alcohol use: Yes    Comment: rarely   Drug use: No   Sexual activity: Not on file  Other Topics Concern   Not on file  Social History Narrative   Not on file   Social Drivers of Health   Financial Resource Strain: Not on file  Food Insecurity: Not on file  Transportation Needs: Not on file  Physical Activity: Not on file  Stress: Not on file  Social Connections: Unknown (02/13/2023)   Received from [facility]   Social Network    Social Network: Not on file  Intimate Partner Violence: Unknown (02/13/2023)   Received from [facility]   HITS    Physically Hurt: Not on file    Insult or Talk Down To: Not on file    Threaten Physical Harm: Not on file    Scream or Curse: Not on file   PHYSICAL EXAM  Vitals:   07/01/24 1552  BP: (!) 143/81  Pulse: 69  SpO2: 99%  Weight: 236 lb 8 oz (107.3 kg)  Height: 5' 6 (1.676 m)   Body mass index is 38.17 kg/m.  Generalized: Well developed, in no acute distress   Neurological examination  Mentation: Alert oriented to time, place, history taking. Follows all commands speech and language fluent Cranial nerve II-XII: Pupils were equal round reactive to light. Extraocular movements were full, visual field were full on confrontational test. Facial sensation and strength were normal. Head turning and shoulder shrug  were normal and symmetric. Motor: The motor testing reveals 5 over 5 strength of all 4 extremities. Good symmetric motor tone is noted throughout.  Sensory: Sensory testing is intact to soft touch on all 4 extremities. No evidence of extinction is noted.  Coordination: Cerebellar testing reveals good  finger-nose-finger and heel-to-shin bilaterally.  Gait and station: Gait is normal.   DIAGNOSTIC DATA (LABS, IMAGING, TESTING) - I reviewed patient records, labs, notes, testing and imaging myself where available.  Lab Results  Component Value Date   WBC 6.3 04/03/2024   HGB 13.9 04/03/2024   HCT 43.1 04/03/2024   MCV 89.4 04/03/2024   PLT 229 04/03/2024      Component Value Date/Time   NA 142 04/03/2024 1242   K 4.1 04/03/2024 1242   CL 106 04/03/2024 1242   CO2 29 04/03/2024 1242   GLUCOSE 93 04/03/2024 1242   GLUCOSE 96 08/20/2006 1433   BUN 16 04/03/2024 1242   CREATININE 0.74 04/03/2024 1242   CREATININE 0.68 02/21/2024 1544   CALCIUM  9.4 04/03/2024 1242   PROT 7.0 04/03/2024 1242   ALBUMIN 4.1 04/03/2024 1242   AST 18 04/03/2024 1242   ALT 10 04/03/2024 1242   ALKPHOS 113 04/03/2024 1242   BILITOT 0.9 04/03/2024 1242   GFRNONAA >60 04/03/2024 1242   GFRAA >60 06/13/2020 0828   GFRAA >60 04/28/2020  9062   Lab Results  Component Value Date   CHOL 217 (H) 05/25/2024   HDL 65.30 05/25/2024   LDLCALC 136 (H) 05/25/2024   LDLDIRECT 141.1 10/01/2013   TRIG 77.0 05/25/2024   CHOLHDL 3 05/25/2024   Lab Results  Component Value Date   HGBA1C 6.1 05/25/2024   Lab Results  Component Value Date   VITAMINB12 324 05/25/2024   Lab Results  Component Value Date   TSH 1.17 11/18/2020    Lauraine Born, SCHARLENE, DNP 07/01/2024, 4:16 PM [facility] [address] [place], [place] [address] [phone]

## 2024-07-01 NOTE — Patient Instructions (Addendum)
 Great to see you today.  Continue current medications.  Follow-up in 6 months.  Thanks!!

## 2024-07-02 ENCOUNTER — Telehealth: Payer: Self-pay | Admitting: *Deleted

## 2024-07-02 NOTE — Telephone Encounter (Signed)
 Pt form @ the front desk for p/u medical Assessment and fmla from.

## 2024-07-08 ENCOUNTER — Ambulatory Visit (INDEPENDENT_AMBULATORY_CARE_PROVIDER_SITE_OTHER)

## 2024-07-08 ENCOUNTER — Ambulatory Visit: Admitting: Internal Medicine

## 2024-07-08 ENCOUNTER — Encounter: Payer: Self-pay | Admitting: Internal Medicine

## 2024-07-08 ENCOUNTER — Ambulatory Visit: Payer: Self-pay | Admitting: Internal Medicine

## 2024-07-08 VITALS — BP 142/70 | HR 85 | Temp 98.7°F | Ht 66.0 in | Wt 236.0 lb

## 2024-07-08 DIAGNOSIS — R062 Wheezing: Secondary | ICD-10-CM

## 2024-07-08 DIAGNOSIS — R051 Acute cough: Secondary | ICD-10-CM | POA: Diagnosis not present

## 2024-07-08 MED ORDER — ALBUTEROL SULFATE HFA 108 (90 BASE) MCG/ACT IN AERS: 2.0000 | INHALATION_SPRAY | Freq: Four times a day (QID) | RESPIRATORY_TRACT | 0 refills | Status: AC | PRN

## 2024-07-08 NOTE — Patient Instructions (Addendum)
    Call [facility] at Phone: [phone]     A chest xray ordered.       Medications changes include :   albuterol inhaler as needed    A referral was ordered Pulmonary and someone will call you to schedule an appointment.

## 2024-07-08 NOTE — Progress Notes (Signed)
 Subjective:    Patient ID: Summer [name], female    DOB: 22-Jan-1951, 73 y.o.   MRN: [id number]      HPI Summer [name] is here for  Chief Complaint  Patient presents with   Wheezing    Loud wheezing sound that wakes her up accompanied by a cough. When she gets up to move around it's gone.     Discussed the use of AI scribe software for clinical note transcription with the patient, who gave verbal consent to proceed.  History of Present Illness Summer [name] is a 73 year old female who presents with nocturnal wheezing and cough.  Approximately three to four weeks ago, she began experiencing a wheezing noise at night, initially mistaking it for her cats. The wheezing has progressively worsened over the past week and is now accompanied by a deep, 'wrangly' cough that occurs primarily at night. The cough is painful in the chest but is not constant and rarely occurs during the day.  The wheezing and cough are primarily nocturnal, starting when she lies down in bed and often waking her up around 2 AM. She has tried adjusting her bed to alleviate symptoms, but the wheezing persists. No shortness of breath, fever, cold symptoms, sore throat, sinus pain, heartburn, nausea, or trouble swallowing. No recent colds, drainage, or burning sensation.  She has not experienced any changes in appetite, fatigue, or coughing during eating. 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  She denies any history of asthma or similar respiratory issues. She reports occasional leg swelling but no chest pain or palpitations. She has not noticed any wheezing during the day while walking around the house.      Medications and allergies reviewed with patient and updated if appropriate.  Current Outpatient Medications on File Prior to Visit  Medication Sig Dispense Refill   anastrozole  (ARIMIDEX ) 1 MG  tablet TAKE 1 TABLET BY MOUTH EVERY DAY 90 tablet 3   baclofen  (LIORESAL ) 10 MG tablet Take 1 tablet (10 mg total) by mouth 2 (two) times daily. 180 tablet 3   diclofenac  Sodium (VOLTAREN ) 1 % GEL Apply 2 g topically 4 (four) times daily. 150 g 0   gabapentin  (NEURONTIN ) 600 MG tablet TAKE 2 TABLETS BY MOUTH 3 TIMES A DAY 540 tablet 3   DULoxetine  (CYMBALTA ) 60 MG capsule TAKE 1 CAPSULE BY MOUTH EVERY DAY (Patient not taking: Reported on 07/08/2024) 90 capsule 3   furosemide  (LASIX ) 20 MG tablet TAKE 1 TABLET BY MOUTH EVERY DAY (Patient not taking: Reported on 07/08/2024) 90 tablet 1   pravastatin  (PRAVACHOL ) 20 MG tablet Take 1 tablet (20 mg total) by mouth daily. (Patient not taking: Reported on 07/08/2024) 90 tablet 3   rOPINIRole  (REQUIP ) 0.25 MG tablet Take 1 tablet (0.25 mg total) by mouth 3 (three) times daily. (Patient not taking: Reported on 07/08/2024) 30 tablet 1   semaglutide -weight management (WEGOVY ) 0.5 MG/0.5ML SOAJ SQ injection Inject 0.5 mg into the skin once a week for 28 days. (Patient not taking: Reported on 07/08/2024) 2 mL 0   [START ON 07/22/2024] semaglutide -weight management (WEGOVY ) 1 MG/0.5ML SOAJ SQ injection Inject 1 mg into the skin once a week for 28 days. (Patient not taking: Reported on 07/08/2024) 2 mL 0   [START ON 08/20/2024] semaglutide -weight management (WEGOVY ) 1.7 MG/0.75ML SOAJ SQ injection Inject 1.7 mg into the skin once a week for 28 days. (  Patient not taking: Reported on 07/01/2024) 3 mL 0   [START ON 09/18/2024] semaglutide -weight management (WEGOVY ) 2.4 MG/0.75ML SOAJ SQ injection Inject 2.4 mg into the skin once a week for 28 days. (Patient not taking: Reported on 07/01/2024) 3 mL 0   tamsulosin  (FLOMAX ) 0.4 MG CAPS capsule Take 0.4 mg by mouth daily. (Patient not taking: Reported on 07/01/2024)     venlafaxine  XR (EFFEXOR -XR) 37.5 MG 24 hr capsule Take 1 capsule (37.5 mg total) by mouth daily with breakfast. (Patient not taking: Reported on 07/01/2024) 90  capsule 1   No current facility-administered medications on file prior to visit.    Review of Systems  Constitutional:  Negative for fever.  HENT:  Negative for congestion, ear pain, sinus pain, sore throat and trouble swallowing.   Respiratory:  Positive for cough (occ - dry  - hurts in chest) and wheezing. Negative for shortness of breath.   Cardiovascular:  Positive for leg swelling (occ). Negative for chest pain and palpitations.  Gastrointestinal:  Negative for nausea.       No gerd       Objective:   Vitals:   07/08/24 1543  BP: (!) 142/70  Pulse: 85  Temp: 98.7 F (37.1 C)  SpO2: 98%   BP Readings from Last 3 Encounters:  07/08/24 (!) 142/70  07/01/24 (!) 143/81  05/25/24 136/80   Wt Readings from Last 3 Encounters:  07/08/24 236 lb (107 kg)  07/01/24 236 lb 8 oz (107.3 kg)  05/25/24 233 lb (105.7 kg)   Body mass index is 38.09 kg/m.    Physical Exam Constitutional:      General: She is not in acute distress.    Appearance: Normal appearance.  HENT:     Head: Normocephalic and atraumatic.  Eyes:     Conjunctiva/sclera: Conjunctivae normal.  Cardiovascular:     Rate and Rhythm: Normal rate and regular rhythm.     Heart sounds: Normal heart sounds.  Pulmonary:     Effort: Pulmonary effort is normal. No respiratory distress.     Breath sounds: Normal breath sounds. No wheezing.  Musculoskeletal:     Cervical back: Neck supple.     Right lower leg: No edema.     Left lower leg: No edema.  Lymphadenopathy:     Cervical: No cervical adenopathy.  Skin:    General: Skin is warm and dry.     Findings: No rash.  Neurological:     Mental Status: She is alert. Mental status is at baseline.  Psychiatric:        Mood and Affect: Mood normal.        Behavior: Behavior normal.        DG Chest 2 View EXAM: 2 VIEW(S) XRAY OF THE CHEST 07/08/2024 04:30:29 PM  COMPARISON: 06/13/2020  CLINICAL HISTORY:  FINDINGS:  LUNGS AND PLEURA: No focal  pulmonary opacity. No pulmonary edema. No pleural effusion. No pneumothorax.  HEART AND MEDIASTINUM: No acute abnormality of the cardiac and mediastinal silhouettes.  BONES AND SOFT TISSUES: No acute osseous abnormality.  IMPRESSION: 1. No acute cardiopulmonary process.  Electronically signed by: Lynwood Seip MD 07/08/2024 04:46 PM EDT RP Workstation: [id number]      Assessment & Plan:    Assessment and Plan Assessment & Plan Wheezing and Acute Cough Wheezing and cough likely due to asthma, allergies, GERD, or postnasal drip. No infection or heartburn signs. GERD is very likely by history but she does not feel that is possible - Order  chest x-ray to rule out infection or pulmonary issues. - Prescribe albuterol inhaler for as-needed use. Discussed potential side effect of heart racing, not harmful. Use before bed or during night if wheezing. - Refer to pulmonologist for further evaluation. Patient prefers pulmonologist before ENT. - Provide pulmonologist contact for follow-up if not contacted within a week.

## 2024-07-13 ENCOUNTER — Encounter: Payer: Self-pay | Admitting: Radiology

## 2024-07-23 ENCOUNTER — Ambulatory Visit

## 2024-07-23 VITALS — BP 141/99 | HR 69 | Temp 98.4°F | Ht 66.0 in | Wt 236.0 lb

## 2024-07-23 DIAGNOSIS — J4531 Mild persistent asthma with (acute) exacerbation: Secondary | ICD-10-CM

## 2024-07-23 DIAGNOSIS — J45901 Unspecified asthma with (acute) exacerbation: Secondary | ICD-10-CM

## 2024-07-23 DIAGNOSIS — R053 Chronic cough: Secondary | ICD-10-CM

## 2024-07-23 LAB — NITRIC OXIDE: Nitric Oxide: 118

## 2024-07-23 MED ORDER — PREDNISONE 20 MG PO TABS: 40.0000 mg | ORAL_TABLET | Freq: Every day | ORAL | 0 refills | Status: AC

## 2024-07-23 MED ORDER — COMPACT SPACE CHAMBER DEVI: 0 refills | Status: AC

## 2024-07-23 MED ORDER — BUDESONIDE-FORMOTEROL FUMARATE 160-4.5 MCG/ACT IN AERO: 2.0000 | INHALATION_SPRAY | Freq: Two times a day (BID) | RESPIRATORY_TRACT | 6 refills | Status: AC

## 2024-07-23 NOTE — Progress Notes (Deleted)
 Subjective:   PATIENT ID: Summer [name] Mayans GENDER: female DOB: 01/31/1951, MRN: [id number]   HPI Discussed the use of AI scribe software for clinical note transcription with the patient, who gave verbal consent to proceed.  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   Complication of anesthesia    Pt states having some difficulty waking up   Constipation 07/01/2018   Costochondritis 05/04/2014   Depression    Diverticulitis of colon 06/13/2007   S/p segmental colectomy for perforated diverticulitis    Diverticulosis    Facial pain 10/06/2015   Fibromyalgia    Foot sprain, left, initial encounter 08/31/2016   Glaucoma NO DROPS   Glossitis 02/02/2016   Helicobacter pylori gastritis 10/2020   HEMATURIA UNSPECIFIED 06/25/2008   Qualifier: Diagnosis of  By: Krystal MD, Reyes A    History of kidney stones    History of shingles 2012--  no residual pain   Hx of adenomatous colonic polyps 10/2020   Hypertension    Itching 10/13/2018   Left eye pain 07/01/2018   Left ureteral calculus    LOC OSTEOARTHROS NOT SPEC PRIM/SEC OTH [facility] SITE 06/27/2009   Qualifier: Diagnosis of  By: Krystal MD, Reyes A    Malignant neoplasm of upper-outer quadrant of right breast in female, 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   MCI (mild cognitive impairment)    Memory loss 12/21/2016   Morbid obesity (HCC) 11/09/2010   Myocardial infarction (HCC)    Pain of right side of body 08/31/2016   Personal history of chemotherapy    Personal history of radiation therapy     Pruritus 10/26/2016   Renal disorder    hx of   Tremor 12/21/2016   Trigeminal neuralgia RIGHT   Trigeminal neuralgia of right side of face 11/30/2010   Trochanteric bursitis, right hip 10/26/2016   Urgency of urination    Weight loss 07/22/2017   Zoster without complications 09/27/2011     Family History  Problem Relation Age of Onset   Heart disease Maternal Grandmother    Diabetes Mother    Breast cancer Maternal Aunt    Lupus Cousin    Colon cancer Neg Hx    Esophageal cancer Neg Hx    Rectal cancer Neg Hx    Stomach cancer Neg Hx    Colon polyps Neg Hx      Social History   Socioeconomic History   Marital status: Widowed    Spouse name: Not on file   Number of children: 1   Years of education: College   Highest education level: Not on file  Occupational History    Employer: UNEMPLOYED   Occupation: HUMAN RESOURCES     Employer: [employer]  Tobacco Use   Smoking status: Never    Passive exposure: Never   Smokeless tobacco: Never  Vaping Use   Vaping status: Never Used  Substance and Sexual Activity   Alcohol use: Yes    Comment: rarely  Drug use: No   Sexual activity: Not on file  Other Topics Concern   Not on file  Social History Narrative   Not on file   Social Drivers of Health   Financial Resource Strain: Not on file  Food Insecurity: Not on file  Transportation Needs: Not on file  Physical Activity: Not on file  Stress: Not on file  Social Connections: Not on file  Intimate Partner Violence: Not on file     Allergies  Allergen Reactions   Keflex  [Cephalexin ] Anaphylaxis and Other (See Comments)    Caused violent dizziness and syncope   Clindamycin Hcl Itching     Outpatient Medications Prior to Visit  Medication Sig Dispense Refill   albuterol (VENTOLIN HFA) 108 (90 Base) MCG/ACT inhaler Inhale 2 puffs into the lungs every 6 (six) hours as needed for wheezing or shortness of breath. 8 g 0   anastrozole  (ARIMIDEX ) 1 MG tablet TAKE  1 TABLET BY MOUTH EVERY DAY 90 tablet 3   baclofen  (LIORESAL ) 10 MG tablet Take 1 tablet (10 mg total) by mouth 2 (two) times daily. 180 tablet 3   gabapentin  (NEURONTIN ) 600 MG tablet TAKE 2 TABLETS BY MOUTH 3 TIMES A DAY 540 tablet 3   diclofenac  Sodium (VOLTAREN ) 1 % GEL Apply 2 g topically 4 (four) times daily. (Patient not taking: Reported on 07/23/2024) 150 g 0   DULoxetine  (CYMBALTA ) 60 MG capsule TAKE 1 CAPSULE BY MOUTH EVERY DAY (Patient not taking: Reported on 07/23/2024) 90 capsule 3   furosemide  (LASIX ) 20 MG tablet TAKE 1 TABLET BY MOUTH EVERY DAY (Patient not taking: Reported on 07/23/2024) 90 tablet 1   pravastatin  (PRAVACHOL ) 20 MG tablet Take 1 tablet (20 mg total) by mouth daily. (Patient not taking: Reported on 07/23/2024) 90 tablet 3   rOPINIRole  (REQUIP ) 0.25 MG tablet Take 1 tablet (0.25 mg total) by mouth 3 (three) times daily. (Patient not taking: Reported on 07/23/2024) 30 tablet 1   semaglutide -weight management (WEGOVY ) 1 MG/0.5ML SOAJ SQ injection Inject 1 mg into the skin once a week for 28 days. (Patient not taking: Reported on 07/23/2024) 2 mL 0   [START ON 08/20/2024] semaglutide -weight management (WEGOVY ) 1.7 MG/0.75ML SOAJ SQ injection Inject 1.7 mg into the skin once a week for 28 days. (Patient not taking: Reported on 07/23/2024) 3 mL 0   [START ON 09/18/2024] semaglutide -weight management (WEGOVY ) 2.4 MG/0.75ML SOAJ SQ injection Inject 2.4 mg into the skin once a week for 28 days. (Patient not taking: Reported on 07/23/2024) 3 mL 0   tamsulosin  (FLOMAX ) 0.4 MG CAPS capsule Take 0.4 mg by mouth daily. (Patient not taking: Reported on 07/23/2024)     venlafaxine  XR (EFFEXOR -XR) 37.5 MG 24 hr capsule Take 1 capsule (37.5 mg total) by mouth daily with breakfast. (Patient not taking: Reported on 07/23/2024) 90 capsule 1   No facility-administered medications prior to visit.    ROS Reviewed all systems and reported negative except as above     Objective:   Vitals:    07/23/24 1611  BP: (!) 141/99  Pulse: 69  Temp: 98.4 F (36.9 C)  TempSrc: Oral  SpO2: 97%  Weight: 236 lb (107 kg)  Height: 5' 6 (1.676 m)    Physical Exam Physical Exam      CBC    Component Value Date/Time   WBC 6.3 04/03/2024 1242   WBC 6.5 02/21/2024 1544   RBC 4.82 04/03/2024 1242   HGB 13.9 04/03/2024 1242   HCT 43.1 04/03/2024  1242   HCT 30.4 (L) 11/19/2018 1543   PLT 229 04/03/2024 1242   MCV 89.4 04/03/2024 1242   MCH 28.8 04/03/2024 1242   MCHC 32.3 04/03/2024 1242   RDW 13.3 04/03/2024 1242   LYMPHSABS 2.4 04/03/2024 1242   MONOABS 0.5 04/03/2024 1242   EOSABS 0.2 04/03/2024 1242   BASOSABS 0.1 04/03/2024 1242     Chest imaging:  PFT:     No data to display          Labs:    Echo:       Assessment & Plan:   Assessment and Plan Assessment & Plan         Zola Herter, MD [facility] Office: [phone]

## 2024-07-23 NOTE — Patient Instructions (Signed)
  VISIT SUMMARY: You came in today due to nighttime wheezing and a dry cough that has been bothering you for about one and a half months. We discussed your symptoms, medical history, and potential triggers, including your new bedroom set and your cats.  YOUR PLAN: -ASTHMA PRESENTING WITH CHRONIC COUGH AND WHEEZING: Asthma is a condition where your airways become inflamed and narrow, causing difficulty in breathing. Your symptoms of chronic cough and wheezing are likely due to asthma. We have prescribed prednisone  (2 pills daily for 5 days) and a Symbicort inhaler (2 puffs twice daily for 2 weeks). If your symptoms improve after 2 weeks, reduce the Symbicort to 1 puff twice daily for another week. If your symptoms persist, continue using Symbicort as needed. We also ordered allergy  blood tests to check for common allergens and advised you to aerate your bedroom and consider environmental factors if your symptoms continue.  INSTRUCTIONS: Please follow the medication plan as prescribed. We have scheduled a follow-up appointment in 2-3 months to monitor your progress. If you experience any worsening of symptoms or have any concerns, contact our office immediately.                      Contains text generated by Abridge.                                 Contains text generated by Abridge.

## 2024-07-23 NOTE — Progress Notes (Signed)
 Subjective:   PATIENT ID: Summer [name] GENDER: female DOB: 1950-12-05, MRN: [id number]   HPI Discussed the use of AI scribe software for clinical note transcription with the patient, who gave verbal consent to proceed.  History of Present Illness Summer [name] is a 73 year old female who presents with nighttime wheezing and dry cough. She was referred by her primary care physician for evaluation of nighttime wheezing and dry cough.  She has been experiencing nighttime wheezing and a dry, non-productive cough for approximately one and a half months. No previous episodes of similar symptoms are noted. She has been using an albuterol inhaler.  Her medical history includes breast cancer in the right breast and fibromyalgia. She also experiences back pain, leg pain, knee pain, and trigeminal neuralgia. No history of heart disease, diabetes, hearing loss, or osteopenia is reported. She has never smoked.  She has two cats and recently acquired a new bedroom set around the time her symptoms began, which she suspects might be contributing to her symptoms. She denies any known allergies, including allergic rhinitis.  Her current medications include an albuterol inhaler, which she finds ineffective.     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   Complication of anesthesia    Pt states having some difficulty waking up   Constipation 07/01/2018   Costochondritis 05/04/2014   Depression    Diverticulitis of colon 06/13/2007   S/p segmental colectomy for perforated diverticulitis     Diverticulosis    Facial pain 10/06/2015   Fibromyalgia    Foot sprain, left, initial encounter 08/31/2016   Glaucoma NO DROPS   Glossitis 02/02/2016   Helicobacter pylori gastritis 10/2020   HEMATURIA UNSPECIFIED 06/25/2008   Qualifier: Diagnosis of  By: Krystal MD, Reyes A    History of kidney stones    History of shingles 2012--  no residual pain   Hx of adenomatous colonic polyps 10/2020   Hypertension    Itching 10/13/2018   Left eye pain 07/01/2018   Left ureteral calculus    LOC OSTEOARTHROS NOT SPEC PRIM/SEC OTH [facility] SITE 06/27/2009   Qualifier: Diagnosis of  By: Krystal MD, Reyes A    Malignant neoplasm of upper-outer quadrant of right breast in female, 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   MCI (mild cognitive impairment)    Memory loss 12/21/2016   Morbid obesity (HCC) 11/09/2010   Myocardial infarction (HCC)    Pain of right side of body 08/31/2016   Personal history of chemotherapy    Personal history of radiation therapy    Pruritus 10/26/2016   Renal disorder    hx of   Tremor 12/21/2016   Trigeminal neuralgia RIGHT   Trigeminal neuralgia of right side of face 11/30/2010   Trochanteric bursitis, right hip 10/26/2016   Urgency of urination    Weight loss 07/22/2017   Zoster without complications 09/27/2011     Family History  Problem Relation Age of Onset   Heart disease Maternal Grandmother    Diabetes Mother  Breast cancer Maternal Aunt    Lupus Cousin    Colon cancer Neg Hx    Esophageal cancer Neg Hx    Rectal cancer Neg Hx    Stomach cancer Neg Hx    Colon polyps Neg Hx      Social History   Socioeconomic History   Marital status: Widowed    Spouse name: Not on file   Number of children: 1   Years of education: College   Highest education level: Not on file  Occupational History    Employer: UNEMPLOYED   Occupation: HUMAN RESOURCES     Employer: [employer]  Tobacco Use   Smoking status: Never    Passive exposure: Never    Smokeless tobacco: Never  Vaping Use   Vaping status: Never Used  Substance and Sexual Activity   Alcohol use: Yes    Comment: rarely   Drug use: No   Sexual activity: Not on file  Other Topics Concern   Not on file  Social History Narrative   Not on file   Social Drivers of Health   Financial Resource Strain: Not on file  Food Insecurity: Not on file  Transportation Needs: Not on file  Physical Activity: Not on file  Stress: Not on file  Social Connections: Not on file  Intimate Partner Violence: Not on file     Allergies  Allergen Reactions   Keflex  [Cephalexin ] Anaphylaxis and Other (See Comments)    Caused violent dizziness and syncope   Clindamycin Hcl Itching     Outpatient Medications Prior to Visit  Medication Sig Dispense Refill   albuterol (VENTOLIN HFA) 108 (90 Base) MCG/ACT inhaler Inhale 2 puffs into the lungs every 6 (six) hours as needed for wheezing or shortness of breath. 8 g 0   anastrozole  (ARIMIDEX ) 1 MG tablet TAKE 1 TABLET BY MOUTH EVERY DAY 90 tablet 3   baclofen  (LIORESAL ) 10 MG tablet Take 1 tablet (10 mg total) by mouth 2 (two) times daily. 180 tablet 3   gabapentin  (NEURONTIN ) 600 MG tablet TAKE 2 TABLETS BY MOUTH 3 TIMES A DAY 540 tablet 3   diclofenac  Sodium (VOLTAREN ) 1 % GEL Apply 2 g topically 4 (four) times daily. (Patient not taking: Reported on 07/23/2024) 150 g 0   DULoxetine  (CYMBALTA ) 60 MG capsule TAKE 1 CAPSULE BY MOUTH EVERY DAY (Patient not taking: Reported on 07/23/2024) 90 capsule 3   furosemide  (LASIX ) 20 MG tablet TAKE 1 TABLET BY MOUTH EVERY DAY (Patient not taking: Reported on 07/23/2024) 90 tablet 1   pravastatin  (PRAVACHOL ) 20 MG tablet Take 1 tablet (20 mg total) by mouth daily. (Patient not taking: Reported on 07/23/2024) 90 tablet 3   rOPINIRole  (REQUIP ) 0.25 MG tablet Take 1 tablet (0.25 mg total) by mouth 3 (three) times daily. (Patient not taking: Reported on 07/23/2024) 30 tablet 1   semaglutide -weight management  (WEGOVY ) 1 MG/0.5ML SOAJ SQ injection Inject 1 mg into the skin once a week for 28 days. (Patient not taking: Reported on 07/23/2024) 2 mL 0   [START ON 08/20/2024] semaglutide -weight management (WEGOVY ) 1.7 MG/0.75ML SOAJ SQ injection Inject 1.7 mg into the skin once a week for 28 days. (Patient not taking: Reported on 07/23/2024) 3 mL 0   [START ON 09/18/2024] semaglutide -weight management (WEGOVY ) 2.4 MG/0.75ML SOAJ SQ injection Inject 2.4 mg into the skin once a week for 28 days. (Patient not taking: Reported on 07/23/2024) 3 mL 0   tamsulosin  (FLOMAX ) 0.4 MG CAPS capsule Take 0.4  mg by mouth daily. (Patient not taking: Reported on 07/23/2024)     venlafaxine  XR (EFFEXOR -XR) 37.5 MG 24 hr capsule Take 1 capsule (37.5 mg total) by mouth daily with breakfast. (Patient not taking: Reported on 07/23/2024) 90 capsule 1   No facility-administered medications prior to visit.    ROS Reviewed all systems and reported negative except as above     Objective:   Vitals:   07/23/24 1611  BP: (!) 141/99  Pulse: 69  Temp: 98.4 F (36.9 C)  TempSrc: Oral  SpO2: 97%  Weight: 236 lb (107 kg)  Height: 5' 6 (1.676 m)    Physical Exam Physical Exam GENERAL: Appropriate to age, no acute distress. HEAD EYES EARS NOSE THROAT: Moist mucous membranes, atraumatic, normocephalic. CHEST: Wheezing present on auscultation. CARDIAC: Regular rate and rhythm, normal S1, normal S2, no murmurs, no rubs, no gallops. ABDOMEN: Soft, nontender. NEUROLOGICAL: Motor and sensation grossly intact, alert and oriented times X 3. EXTREMITIES: Warm, well perfused, no edema.     CBC    Component Value Date/Time   WBC 6.3 04/03/2024 1242   WBC 6.5 02/21/2024 1544   RBC 4.82 04/03/2024 1242   HGB 13.9 04/03/2024 1242   HCT 43.1 04/03/2024 1242   HCT 30.4 (L) 11/19/2018 1543   PLT 229 04/03/2024 1242   MCV 89.4 04/03/2024 1242   MCH 28.8 04/03/2024 1242   MCHC 32.3 04/03/2024 1242   RDW 13.3 04/03/2024 1242    LYMPHSABS 2.4 04/03/2024 1242   MONOABS 0.5 04/03/2024 1242   EOSABS 0.2 04/03/2024 1242   BASOSABS 0.1 04/03/2024 1242     Chest imaging: I reviewed her CXR which looks normal.       Assessment & Plan:   Assessment and Plan Assessment & Plan Asthma with acute exacerbation presenting with chronic cough and wheezing likely in the setting of new changes to bedroom furniture and flooring.  She is also noted to have cats but she says that she has had them for 15 years and her most recent Was added 7 years ago.  feno test very high at 118. Chronic cough and wheezing for 1.5 months, likely due to asthma. High phenotest indicating lung inflammation. Albuterol ineffective, requiring inhaled corticosteroid. Potential for asthma to recur, necessitating ongoing management. - Prescribed prednisone , 2 pills daily for 5 days. - Prescribed Symbicort inhaler, 2 puffs twice daily for 2 weeks. - After 2 weeks, if symptoms improve, reduce Symbicort to 1 puff twice daily for another week. - If symptoms persist, continue Symbicort as needed. - Ordered allergy  blood tests for common allergens including dogs, cats, birchwood, pollen, and fungi. - Advised to aerate bedroom and consider environmental factors if symptoms persist. - Scheduled follow-up appointment in 2-3 months.        Zola Herter, MD [facility] Office: [phone]

## 2024-07-24 LAB — CBC WITH DIFFERENTIAL/PLATELET
Basophils Absolute: 0.1 K/uL (ref 0.0–0.1)
Basophils Relative: 1.3 % (ref 0.0–3.0)
Eosinophils Absolute: 0.3 K/uL (ref 0.0–0.7)
Eosinophils Relative: 5 % (ref 0.0–5.0)
HCT: 42.5 % (ref 36.0–46.0)
Hemoglobin: 13.8 g/dL (ref 12.0–15.0)
Lymphocytes Relative: 39.1 % (ref 12.0–46.0)
Lymphs Abs: 2.4 K/uL (ref 0.7–4.0)
MCHC: 32.5 g/dL (ref 30.0–36.0)
MCV: 89.1 fl (ref 78.0–100.0)
Monocytes Absolute: 0.5 K/uL (ref 0.1–1.0)
Monocytes Relative: 8 % (ref 3.0–12.0)
Neutro Abs: 2.9 K/uL (ref 1.4–7.7)
Neutrophils Relative %: 46.6 % (ref 43.0–77.0)
Platelets: 186 K/uL (ref 150.0–400.0)
RBC: 4.77 Mil/uL (ref 3.87–5.11)
RDW: 13.6 % (ref 11.5–15.5)
WBC: 6.2 K/uL (ref 4.0–10.5)

## 2024-07-27 ENCOUNTER — Encounter: Payer: Self-pay | Admitting: Internal Medicine

## 2024-07-27 LAB — RESPIRATORY ALLERGY PANEL REGION II W/ RFLX: ~~LOC~~
Allergen, A. alternata, m6: <0.1 kU/L
Allergen, Cedar tree, t12: <0.1 kU/L
Allergen, Comm Silver Birch, t9: <0.1 kU/L
Allergen, Cottonwood, t14: 0.21 kU/L — ABNORMAL HIGH
Allergen, D pternoyssinus,d7: <0.1 kU/L
Allergen, Mouse Urine Protein, e78: <0.1 kU/L
Allergen, Mulberry, t76: <0.1 kU/L
Allergen, Oak,t7: <0.1 kU/L
Allergen, P. notatum, m1: <0.1 kU/L
Aspergillus fumigatus, m3: 0.16 kU/L — ABNORMAL HIGH
Bermuda Grass: 2.14 kU/L — ABNORMAL HIGH
Box Elder IgE: 0.1 kU/L — ABNORMAL HIGH
CLADOSPORIUM HERBARUM (M2) IGE: <0.1 kU/L
COMMON RAGWEED (SHORT) (W1) IGE: 0.92 kU/L — ABNORMAL HIGH
Cat Dander: 5.5 kU/L — ABNORMAL HIGH
Class: 0
Class: 0
Class: 0
Class: 0
Class: 0
Class: 0
Class: 0
Class: 0
Class: 0
Class: 0
Class: 0
Class: 0
Class: 1
Class: 2
Class: 2
Class: 2
Class: 2
Class: 2
Class: 3
Cockroach: <0.1 kU/L
D. farinae: 0.11 kU/L — ABNORMAL HIGH
Dog Dander: 0.92 kU/L — ABNORMAL HIGH
Elm IgE: 0.17 kU/L — ABNORMAL HIGH
IgE (Immunoglobulin E), Serum: 152 kU/L — ABNORMAL HIGH (ref <=114)
Johnson Grass: 1.45 kU/L — ABNORMAL HIGH
Pecan/Hickory Tree IgE: 0.58 kU/L — ABNORMAL HIGH
Rough Pigweed  IgE: <0.1 kU/L
Sheep Sorrel IgE: <0.1 kU/L
Timothy Grass: 3.28 kU/L — ABNORMAL HIGH

## 2024-07-27 LAB — CAT DANDER COMPONENT
E220-IgE Fel d 2: <0.1 kU/L (ref <0.10)
E228-IgE Fel d 4: 0.31 kU/L — ABNORMAL HIGH (ref <0.10)
Fel d 1 (e94) IgE: 3.38 kU/L — ABNORMAL HIGH (ref <0.10)
Fel d 7 (e231) IgE: 0.34 kU/L — ABNORMAL HIGH (ref <0.10)

## 2024-07-27 LAB — DOG DANDER COMPONENT
Can f 4(e229) IgE: <0.1 kU/L (ref <0.10)
Can f 6(e230) IgE: <0.1 kU/L (ref <0.10)
E101-IgE Can f 1: 0.17 kU/L — ABNORMAL HIGH (ref <0.10)
E102-IgE Can f 2: <0.1 kU/L (ref <0.10)
E221-IgE Can f 3: <0.1 kU/L (ref <0.10)
E226-IgE Can f 5: 1.16 kU/L — ABNORMAL HIGH (ref <0.10)

## 2024-07-27 LAB — INTERPRETATION:

## 2024-08-04 ENCOUNTER — Ambulatory Visit: Admitting: Internal Medicine

## 2024-08-04 DIAGNOSIS — R252 Cramp and spasm: Secondary | ICD-10-CM | POA: Diagnosis not present

## 2024-08-04 DIAGNOSIS — Z9109 Other allergy status, other than to drugs and biological substances: Secondary | ICD-10-CM | POA: Diagnosis not present

## 2024-08-04 DIAGNOSIS — M7989 Other specified soft tissue disorders: Secondary | ICD-10-CM | POA: Diagnosis not present

## 2024-08-04 DIAGNOSIS — Z6838 Body mass index (BMI) 38.0-38.9, adult: Secondary | ICD-10-CM

## 2024-08-04 DIAGNOSIS — K148 Other diseases of tongue: Secondary | ICD-10-CM

## 2024-08-04 NOTE — Progress Notes (Signed)
 Subjective:   Patient ID: Summer [name], female    DOB: [birth year], 73 y.o.   MRN: [id number]  Discussed the use of AI scribe software for clinical note transcription with the patient, who gave verbal consent to proceed.  History of Present Illness Summer [name] is a 73 year old female who presents with tongue discoloration and concerns about asthma and allergies.  She has experienced a yellowish discoloration in the middle part of her tongue for about two months. This discoloration does not brush off easily, although some yellowish residue is visible when she spits or rubs her tongue with a towel. No new mouth or dental problems, sores, or ulcers.  She has a history of asthma and allergies, with known allergies to certain types of dander, trees, grasses, and ragweed. She works in an environment with old carpets and poor ventilation and recently remodeled her bedroom with new flooring and furniture. She has used prednisone  and an inhaler.  She experiences muscle cramps, particularly under her breasts, which she attributes to laughing or possibly the weight of her breasts. She has difficulty finding a suitable bra due to asymmetry following cancer treatment.  She has not been taking her furosemide  regularly, which was prescribed for swelling in her left foot, and she is unsure if she needs to continue it.  Review of Systems  Constitutional: Negative.   HENT: Negative.         Tongue discoloration  Eyes: Negative.   Respiratory:  Negative for cough, chest tightness and shortness of breath.   Cardiovascular:  Positive for leg swelling. Negative for chest pain and palpitations.  Gastrointestinal:  Negative for abdominal distention, abdominal pain, constipation, diarrhea, nausea and vomiting.  Musculoskeletal:  Positive for arthralgias and myalgias.  Skin: Negative.   Neurological: Negative.   Psychiatric/Behavioral: Negative.      Objective:  Physical  Exam Constitutional:      Appearance: She is well-developed.  HENT:     Head: Normocephalic and atraumatic.     Ears:     Comments: Tongue with yellowish discoloration in midline without fissuring or mass or growth or crusting, no oral lesions Cardiovascular:     Rate and Rhythm: Normal rate and regular rhythm.  Pulmonary:     Effort: Pulmonary effort is normal. No respiratory distress.     Breath sounds: Normal breath sounds. No wheezing or rales.  Abdominal:     General: Bowel sounds are normal. There is no distension.     Palpations: Abdomen is soft.     Tenderness: There is no abdominal tenderness.  Musculoskeletal:        General: Tenderness present.     Cervical back: Normal range of motion.     Left lower leg: Edema present.     Comments: 1+ LE edema left  Skin:    General: Skin is warm and dry.  Neurological:     Mental Status: She is alert and oriented to person, place, and time.     Coordination: Coordination normal.     Vitals:   08/04/24 1329  BP: 130/60  Pulse: 73  Temp: 98.5 F (36.9 C)  TempSrc: Oral  SpO2: 98%  Weight: 238 lb 3.2 oz (108 kg)  Height: 5' 6 (1.676 m)    Assessment and Plan Assessment & Plan Tongue discoloration (yellowish/brown)   Persistent discoloration for two months may be due to medication side effects, dietary factors, or environmental exposure. There is no evidence of thrush or black hairy tongue.  Coffee consumption or dietary consumption of tea or things with high food dyes. Reduce coffee intake.  Chronic lower extremity pain and swelling   She experiences chronic swelling in the left foot. Furosemide  is prescribed but not consistently taken. Continue furosemide  as needed for swelling.  Morbid obesity   Her obesity contributes to muscle strain and cramps, particularly under the breasts due to weight distribution.  Environmental allergies (pollen, dander, grass, ragweed)   She has allergic reactions to various allergens, with  symptoms potentially exacerbated by environmental exposure at work and home. Use an air purifier at home to reduce allergen exposure.  Muscle cramps   Intermittent cramps may be related to laughing or weight distribution. Try mustard, Tums, or magnesium  as needed for relief.

## 2024-08-04 NOTE — Patient Instructions (Addendum)
 It may be that coffee/tea or strongly dyed foods could be causing the tongue color change.  This does not look like thrush and does not look like any medication reaction.  For the cramps tums/calcium , mustard, or magnesium  can help with these.

## 2024-08-05 ENCOUNTER — Encounter: Payer: Self-pay | Admitting: Internal Medicine

## 2024-08-05 DIAGNOSIS — Z9109 Other allergy status, other than to drugs and biological substances: Secondary | ICD-10-CM | POA: Insufficient documentation

## 2024-08-05 DIAGNOSIS — K148 Other diseases of tongue: Secondary | ICD-10-CM | POA: Insufficient documentation

## 2024-08-05 NOTE — Assessment & Plan Note (Signed)
 Persistent discoloration for two months may be due to medication side effects, dietary factors, or environmental exposure. There is no evidence of thrush or black hairy tongue. Coffee consumption or dietary consumption of tea or things with high food dyes. Reduce coffee intake.

## 2024-08-05 NOTE — Assessment & Plan Note (Signed)
 Her obesity contributes to muscle strain and cramps, particularly under the breasts due to weight distribution.

## 2024-08-05 NOTE — Assessment & Plan Note (Signed)
 She has allergic reactions to various allergens, with symptoms potentially exacerbated by environmental exposure at work and home. Use an air purifier at home to reduce allergen exposure.Reviewed and discussed with her recent allergy  testing results.

## 2024-08-05 NOTE — Assessment & Plan Note (Signed)
 Intermittent cramps may be related to laughing or weight distribution. Try mustard, Tums, or magnesium  as needed for relief.

## 2024-08-05 NOTE — Assessment & Plan Note (Signed)
 She experiences chronic swelling in the left foot. Furosemide  is prescribed but not consistently taken. Continue furosemide  as needed for swelling.

## 2024-09-14 ENCOUNTER — Telehealth: Payer: Self-pay | Admitting: Internal Medicine

## 2024-09-14 NOTE — Telephone Encounter (Signed)
 Patient dropped off document Handicap Placard, to be filled out by provider. Patient requested to send it back via Call Patient to pick up within 7-days. Document is located in providers tray at front office.Please advise at [phone]

## 2024-09-18 NOTE — Telephone Encounter (Signed)
 Called pt to confirm if Handicap Placard was received. LVM for pt to call office.

## 2024-09-28 ENCOUNTER — Telehealth: Payer: Self-pay

## 2024-09-28 NOTE — Telephone Encounter (Signed)
 Pt is aware. Handicap placard has been signed and is at the front for pickup.

## 2024-09-30 ENCOUNTER — Telehealth: Payer: Self-pay | Admitting: Neurology

## 2024-09-30 NOTE — Telephone Encounter (Signed)
 [facility] cxl

## 2024-10-01 NOTE — Telephone Encounter (Signed)
 Pt has called and r/s with wait list

## 2024-10-05 ENCOUNTER — Telehealth: Payer: Self-pay | Admitting: Nurse Practitioner

## 2024-10-05 NOTE — Telephone Encounter (Signed)
 Rescheduled appointments per 1/26 email due to inclement weather. Called and left  VM with appointment details for the patient.

## 2024-10-07 ENCOUNTER — Ambulatory Visit: Admitting: Nurse Practitioner

## 2024-10-07 ENCOUNTER — Other Ambulatory Visit

## 2024-10-13 ENCOUNTER — Ambulatory Visit: Admitting: Neurology

## 2024-10-13 VITALS — BP 156/80 | HR 86 | Ht 66.0 in | Wt 243.6 lb

## 2024-10-13 DIAGNOSIS — G500 Trigeminal neuralgia: Secondary | ICD-10-CM

## 2024-10-22 ENCOUNTER — Inpatient Hospital Stay: Admitting: Nurse Practitioner

## 2024-10-22 ENCOUNTER — Inpatient Hospital Stay

## 2024-11-05 ENCOUNTER — Ambulatory Visit

## 2025-01-28 ENCOUNTER — Ambulatory Visit: Admitting: Neurology

## 2025-04-12 ENCOUNTER — Ambulatory Visit: Admitting: Neurology

## 2025-05-06 ENCOUNTER — Ambulatory Visit: Admitting: Neurology
